# Patient Record
Sex: Male | Born: 1960 | Race: White | Hispanic: No | Marital: Married | Smoking: Current some day smoker
Health system: Southern US, Community
[De-identification: ages and names within clinical notes are randomized; demographics above are authoritative.]

## PROBLEM LIST (undated history)

## (undated) DIAGNOSIS — R0600 Dyspnea, unspecified: Secondary | ICD-10-CM

## (undated) DIAGNOSIS — G8929 Other chronic pain: Secondary | ICD-10-CM

## (undated) DIAGNOSIS — I10 Essential (primary) hypertension: Secondary | ICD-10-CM

## (undated) DIAGNOSIS — J449 Chronic obstructive pulmonary disease, unspecified: Secondary | ICD-10-CM

## (undated) DIAGNOSIS — I669 Occlusion and stenosis of unspecified cerebral artery: Secondary | ICD-10-CM

## (undated) DIAGNOSIS — F319 Bipolar disorder, unspecified: Secondary | ICD-10-CM

## (undated) DIAGNOSIS — M549 Dorsalgia, unspecified: Secondary | ICD-10-CM

## (undated) DIAGNOSIS — K7290 Hepatic failure, unspecified without coma: Secondary | ICD-10-CM

## (undated) DIAGNOSIS — F32A Depression, unspecified: Secondary | ICD-10-CM

## (undated) DIAGNOSIS — H5440 Blindness, one eye, unspecified eye: Secondary | ICD-10-CM

## (undated) DIAGNOSIS — R569 Unspecified convulsions: Secondary | ICD-10-CM

## (undated) DIAGNOSIS — K759 Inflammatory liver disease, unspecified: Secondary | ICD-10-CM

## (undated) DIAGNOSIS — I517 Cardiomegaly: Secondary | ICD-10-CM

## (undated) DIAGNOSIS — I679 Cerebrovascular disease, unspecified: Secondary | ICD-10-CM

## (undated) DIAGNOSIS — F0390 Unspecified dementia without behavioral disturbance: Secondary | ICD-10-CM

## (undated) DIAGNOSIS — F419 Anxiety disorder, unspecified: Secondary | ICD-10-CM

## (undated) DIAGNOSIS — K7682 Hepatic encephalopathy: Secondary | ICD-10-CM

## (undated) DIAGNOSIS — K219 Gastro-esophageal reflux disease without esophagitis: Secondary | ICD-10-CM

## (undated) DIAGNOSIS — I890 Lymphedema, not elsewhere classified: Secondary | ICD-10-CM

## (undated) DIAGNOSIS — F329 Major depressive disorder, single episode, unspecified: Secondary | ICD-10-CM

## (undated) DIAGNOSIS — M1990 Unspecified osteoarthritis, unspecified site: Secondary | ICD-10-CM

## (undated) DIAGNOSIS — M21961 Unspecified acquired deformity of right lower leg: Secondary | ICD-10-CM

## (undated) DIAGNOSIS — K5790 Diverticulosis of intestine, part unspecified, without perforation or abscess without bleeding: Secondary | ICD-10-CM

## (undated) DIAGNOSIS — E785 Hyperlipidemia, unspecified: Secondary | ICD-10-CM

## (undated) DIAGNOSIS — Z91199 Patient's noncompliance with other medical treatment and regimen due to unspecified reason: Secondary | ICD-10-CM

## (undated) DIAGNOSIS — H9190 Unspecified hearing loss, unspecified ear: Secondary | ICD-10-CM

## (undated) DIAGNOSIS — D696 Thrombocytopenia, unspecified: Secondary | ICD-10-CM

## (undated) DIAGNOSIS — Z9119 Patient's noncompliance with other medical treatment and regimen: Secondary | ICD-10-CM

## (undated) DIAGNOSIS — K7460 Unspecified cirrhosis of liver: Secondary | ICD-10-CM

## (undated) DIAGNOSIS — F79 Unspecified intellectual disabilities: Secondary | ICD-10-CM

## (undated) DIAGNOSIS — D649 Anemia, unspecified: Secondary | ICD-10-CM

## (undated) HISTORY — DX: Occlusion and stenosis of unspecified cerebral artery: I66.9

## (undated) HISTORY — DX: Cardiomegaly: I51.7

## (undated) HISTORY — DX: Thrombocytopenia, unspecified: D69.6

## (undated) HISTORY — DX: Hyperlipidemia, unspecified: E78.5

## (undated) HISTORY — DX: Unspecified hearing loss, unspecified ear: H91.90

## (undated) HISTORY — PX: TONSILLECTOMY: SUR1361

## (undated) HISTORY — PX: OTHER SURGICAL HISTORY: SHX169

## (undated) HISTORY — DX: Diverticulosis of intestine, part unspecified, without perforation or abscess without bleeding: K57.90

## (undated) HISTORY — PX: FOOT SURGERY: SHX648

## (undated) HISTORY — DX: Cerebrovascular disease, unspecified: I67.9

## (undated) HISTORY — DX: Anxiety disorder, unspecified: F41.9

## (undated) HISTORY — DX: Anemia, unspecified: D64.9

---

## 2000-12-08 ENCOUNTER — Other Ambulatory Visit: Admission: RE | Admit: 2000-12-08 | Discharge: 2000-12-08 | Payer: Self-pay | Admitting: Dermatology

## 2004-11-26 ENCOUNTER — Ambulatory Visit: Payer: Self-pay | Admitting: Cardiology

## 2004-12-02 ENCOUNTER — Ambulatory Visit: Payer: Self-pay

## 2006-04-04 ENCOUNTER — Emergency Department (HOSPITAL_COMMUNITY): Admission: EM | Admit: 2006-04-04 | Discharge: 2006-04-04 | Payer: Self-pay | Admitting: Emergency Medicine

## 2008-11-04 ENCOUNTER — Ambulatory Visit: Payer: Self-pay | Admitting: Internal Medicine

## 2008-11-13 LAB — CBC WITH DIFFERENTIAL/PLATELET
Basophils Absolute: 0 10*3/uL (ref 0.0–0.1)
EOS%: 3.3 % (ref 0.0–7.0)
Eosinophils Absolute: 0.2 10*3/uL (ref 0.0–0.5)
HCT: 41.5 % (ref 38.4–49.9)
HGB: 14.5 g/dL (ref 13.0–17.1)
MCH: 33.8 pg — ABNORMAL HIGH (ref 27.2–33.4)
MCV: 96.6 fL (ref 79.3–98.0)
MONO%: 7.9 % (ref 0.0–14.0)
NEUT#: 2.2 10*3/uL (ref 1.5–6.5)
NEUT%: 42.7 % (ref 39.0–75.0)

## 2008-11-13 LAB — COMPREHENSIVE METABOLIC PANEL
AST: 42 U/L — ABNORMAL HIGH (ref 0–37)
Albumin: 4.5 g/dL (ref 3.5–5.2)
Alkaline Phosphatase: 88 U/L (ref 39–117)
BUN: 8 mg/dL (ref 6–23)
Calcium: 9.7 mg/dL (ref 8.4–10.5)
Chloride: 101 mEq/L (ref 96–112)
Creatinine, Ser: 0.87 mg/dL (ref 0.40–1.50)
Glucose, Bld: 253 mg/dL — ABNORMAL HIGH (ref 70–99)

## 2010-04-10 ENCOUNTER — Emergency Department (HOSPITAL_COMMUNITY): Admission: EM | Admit: 2010-04-10 | Discharge: 2010-04-10 | Payer: Self-pay | Admitting: Emergency Medicine

## 2010-08-10 ENCOUNTER — Encounter: Payer: Self-pay | Admitting: Orthopedic Surgery

## 2010-08-10 ENCOUNTER — Inpatient Hospital Stay (HOSPITAL_COMMUNITY): Admission: EM | Admit: 2010-08-10 | Discharge: 2010-08-13 | Payer: Self-pay | Source: Home / Self Care

## 2010-08-11 ENCOUNTER — Encounter: Payer: Self-pay | Admitting: Orthopedic Surgery

## 2010-08-12 ENCOUNTER — Encounter (INDEPENDENT_AMBULATORY_CARE_PROVIDER_SITE_OTHER): Payer: Self-pay | Admitting: Internal Medicine

## 2010-09-23 ENCOUNTER — Telehealth: Payer: Self-pay | Admitting: Orthopedic Surgery

## 2010-09-24 ENCOUNTER — Ambulatory Visit: Admit: 2010-09-24 | Payer: Self-pay | Admitting: Orthopedic Surgery

## 2010-09-24 ENCOUNTER — Ambulatory Visit: Payer: Self-pay | Admitting: Orthopedic Surgery

## 2010-09-30 NOTE — Consult Note (Signed)
Summary: Hosp consult RT hand [facility] consult RT hand FX   Imported By: Ihor Austin 09/20/2010 13:33:57  _____________________________________________________________________  External Attachment:    Type:   Image     Comment:   External Document

## 2010-09-30 NOTE — Letter (Signed)
Summary: authorization notification [employer]  authorization notification [place] By: Ihor Austin 09/25/2010 13:55:04  _____________________________________________________________________  External Attachment:    Type:   Image     Comment:   External Document

## 2010-09-30 NOTE — Progress Notes (Signed)
Summary: patient request to cancel appointment for now  Phone Note Call from Patient   Summary of Call: When our office called patient earlier today to remind of tomorrow's appointment (09/24/10, 10:30am, family member requested cancelling appointment. *  * I called back to patient for further information and offered to re-schedule, as this is patient's initial hospital fol/up appointment. Initial call taken by: Ihor Austin,  September 23, 2010 6:04 PM  Follow-up for Phone Call        ok Follow-up by: Peter Minium,  September 24, 2010 8:28 AM  Additional Follow-up for Phone Call Additional follow up Details #1::        No response back as of yet; patient on reminder/recall to call for appointment. Additional Follow-up by: Ihor Austin,  September 25, 2010 8:52 AM

## 2010-10-05 ENCOUNTER — Emergency Department (HOSPITAL_COMMUNITY): Payer: MEDICARE

## 2010-10-05 ENCOUNTER — Inpatient Hospital Stay (HOSPITAL_COMMUNITY)
Admission: EM | Admit: 2010-10-05 | Discharge: 2010-10-08 | DRG: 442 | Disposition: A | Discharge status: Skilled Nursing Facility | Payer: MEDICARE | Attending: Internal Medicine | Admitting: Internal Medicine

## 2010-10-05 DIAGNOSIS — R269 Unspecified abnormalities of gait and mobility: Secondary | ICD-10-CM | POA: Diagnosis present

## 2010-10-05 DIAGNOSIS — E119 Type 2 diabetes mellitus without complications: Secondary | ICD-10-CM | POA: Diagnosis present

## 2010-10-05 DIAGNOSIS — I10 Essential (primary) hypertension: Secondary | ICD-10-CM | POA: Diagnosis present

## 2010-10-05 DIAGNOSIS — K7682 Hepatic encephalopathy: Principal | ICD-10-CM | POA: Diagnosis present

## 2010-10-05 DIAGNOSIS — R627 Adult failure to thrive: Secondary | ICD-10-CM | POA: Diagnosis present

## 2010-10-05 DIAGNOSIS — Z9181 History of falling: Secondary | ICD-10-CM

## 2010-10-05 DIAGNOSIS — F3289 Other specified depressive episodes: Secondary | ICD-10-CM | POA: Diagnosis present

## 2010-10-05 DIAGNOSIS — G8929 Other chronic pain: Secondary | ICD-10-CM | POA: Diagnosis present

## 2010-10-05 DIAGNOSIS — M549 Dorsalgia, unspecified: Secondary | ICD-10-CM | POA: Diagnosis present

## 2010-10-05 DIAGNOSIS — D684 Acquired coagulation factor deficiency: Secondary | ICD-10-CM | POA: Diagnosis present

## 2010-10-05 DIAGNOSIS — L259 Unspecified contact dermatitis, unspecified cause: Secondary | ICD-10-CM | POA: Diagnosis present

## 2010-10-05 DIAGNOSIS — K7290 Hepatic failure, unspecified without coma: Principal | ICD-10-CM | POA: Diagnosis present

## 2010-10-05 DIAGNOSIS — M1990 Unspecified osteoarthritis, unspecified site: Secondary | ICD-10-CM | POA: Diagnosis present

## 2010-10-05 DIAGNOSIS — K7689 Other specified diseases of liver: Secondary | ICD-10-CM | POA: Diagnosis present

## 2010-10-05 DIAGNOSIS — R609 Edema, unspecified: Secondary | ICD-10-CM | POA: Diagnosis present

## 2010-10-05 DIAGNOSIS — D6959 Other secondary thrombocytopenia: Secondary | ICD-10-CM | POA: Diagnosis present

## 2010-10-05 DIAGNOSIS — Z66 Do not resuscitate: Secondary | ICD-10-CM | POA: Diagnosis present

## 2010-10-05 DIAGNOSIS — F411 Generalized anxiety disorder: Secondary | ICD-10-CM | POA: Diagnosis present

## 2010-10-05 DIAGNOSIS — R894 Abnormal immunological findings in specimens from other organs, systems and tissues: Secondary | ICD-10-CM | POA: Diagnosis present

## 2010-10-05 DIAGNOSIS — F329 Major depressive disorder, single episode, unspecified: Secondary | ICD-10-CM | POA: Diagnosis present

## 2010-10-05 DIAGNOSIS — G40909 Epilepsy, unspecified, not intractable, without status epilepticus: Secondary | ICD-10-CM | POA: Diagnosis present

## 2010-10-05 LAB — DIFFERENTIAL
Basophils Absolute: 0.1 10*3/uL (ref 0.0–0.1)
Basophils Relative: 1 % (ref 0–1)
Eosinophils Absolute: 0.2 10*3/uL (ref 0.0–0.7)
Eosinophils Relative: 4 % (ref 0–5)
Lymphocytes Relative: 45 % (ref 12–46)
Lymphs Abs: 1.9 10*3/uL (ref 0.7–4.0)
Monocytes Absolute: 0.5 10*3/uL (ref 0.1–1.0)
Monocytes Relative: 12 % (ref 3–12)
Neutro Abs: 1.6 10*3/uL — ABNORMAL LOW (ref 1.7–7.7)
Neutrophils Relative %: 39 % — ABNORMAL LOW (ref 43–77)

## 2010-10-05 LAB — GLUCOSE, CAPILLARY
Glucose-Capillary: 98 mg/dL (ref 70–99)
Glucose-Capillary: 119 mg/dL — ABNORMAL HIGH (ref 70–99)

## 2010-10-05 LAB — CARDIAC PANEL(CRET KIN+CKTOT+MB+TROPI)
CK, MB: 1.1 ng/mL (ref 0.3–4.0)
Relative Index: INVALID (ref 0.0–2.5)
Total CK: 72 U/L (ref 7–232)
Troponin I: 0.01 ng/mL (ref 0.00–0.06)

## 2010-10-05 LAB — COMPREHENSIVE METABOLIC PANEL
ALT: 24 U/L (ref 0–53)
AST: 52 U/L — ABNORMAL HIGH (ref 0–37)
Albumin: 3.4 g/dL — ABNORMAL LOW (ref 3.5–5.2)
Alkaline Phosphatase: 101 U/L (ref 39–117)
BUN: 8 mg/dL (ref 6–23)
CO2: 25 mEq/L (ref 19–32)
Calcium: 9.2 mg/dL (ref 8.4–10.5)
Chloride: 105 mEq/L (ref 96–112)
Creatinine, Ser: 0.56 mg/dL (ref 0.4–1.5)
GFR calc Af Amer: >60 mL/min (ref >60)
GFR calc non Af Amer: >60 mL/min (ref >60)
Glucose, Bld: 94 mg/dL (ref 70–99)
Potassium: 4.1 mEq/L (ref 3.5–5.1)
Sodium: 136 mEq/L (ref 135–145)
Total Bilirubin: 2.6 mg/dL — ABNORMAL HIGH (ref 0.3–1.2)
Total Protein: 7.2 g/dL (ref 6.0–8.3)

## 2010-10-05 LAB — AMMONIA: Ammonia: 143 umol/L — ABNORMAL HIGH (ref 11–35)

## 2010-10-05 LAB — CBC
HCT: 35.7 % — ABNORMAL LOW (ref 39.0–52.0)
Hemoglobin: 12 g/dL — ABNORMAL LOW (ref 13.0–17.0)
MCH: 32 pg (ref 26.0–34.0)
MCHC: 33.6 g/dL (ref 30.0–36.0)
MCV: 95.2 fL (ref 78.0–100.0)
Platelets: 75 10*3/uL — ABNORMAL LOW (ref 150–400)
RBC: 3.75 MIL/uL — ABNORMAL LOW (ref 4.22–5.81)
RDW: 16.5 % — ABNORMAL HIGH (ref 11.5–15.5)
WBC: 4.2 10*3/uL (ref 4.0–10.5)

## 2010-10-06 DIAGNOSIS — K7290 Hepatic failure, unspecified without coma: Secondary | ICD-10-CM

## 2010-10-06 LAB — CBC
HCT: 33.9 % — ABNORMAL LOW (ref 39.0–52.0)
Hemoglobin: 11.5 g/dL — ABNORMAL LOW (ref 13.0–17.0)
MCH: 32.2 pg (ref 26.0–34.0)
MCHC: 33.9 g/dL (ref 30.0–36.0)
MCV: 95 fL (ref 78.0–100.0)
Platelets: 74 10*3/uL — ABNORMAL LOW (ref 150–400)
RBC: 3.57 MIL/uL — ABNORMAL LOW (ref 4.22–5.81)
RDW: 16.3 % — ABNORMAL HIGH (ref 11.5–15.5)
WBC: 4.2 10*3/uL (ref 4.0–10.5)

## 2010-10-06 LAB — COMPREHENSIVE METABOLIC PANEL
Albumin: 3.1 g/dL — ABNORMAL LOW (ref 3.5–5.2)
BUN: 5 mg/dL — ABNORMAL LOW (ref 6–23)
CO2: 25 mEq/L (ref 19–32)
Chloride: 106 mEq/L (ref 96–112)
Creatinine, Ser: 0.61 mg/dL (ref 0.4–1.5)
GFR calc non Af Amer: >60 mL/min (ref >60)
Total Bilirubin: 2.3 mg/dL — ABNORMAL HIGH (ref 0.3–1.2)

## 2010-10-06 LAB — DIFFERENTIAL
Basophils Absolute: 0.1 10*3/uL (ref 0.0–0.1)
Basophils Relative: 1 % (ref 0–1)
Eosinophils Absolute: 0.1 10*3/uL (ref 0.0–0.7)
Eosinophils Relative: 2 % (ref 0–5)
Lymphocytes Relative: 59 % — ABNORMAL HIGH (ref 12–46)
Lymphs Abs: 2.5 10*3/uL (ref 0.7–4.0)
Monocytes Absolute: 0.5 10*3/uL (ref 0.1–1.0)
Monocytes Relative: 11 % (ref 3–12)
Neutro Abs: 1.1 10*3/uL — ABNORMAL LOW (ref 1.7–7.7)
Neutrophils Relative %: 27 % — ABNORMAL LOW (ref 43–77)

## 2010-10-06 LAB — AMMONIA: Ammonia: 131 umol/L — ABNORMAL HIGH (ref 11–35)

## 2010-10-06 LAB — APTT: aPTT: 40 seconds — ABNORMAL HIGH (ref 24–37)

## 2010-10-06 LAB — CARDIAC PANEL(CRET KIN+CKTOT+MB+TROPI)
Relative Index: INVALID (ref 0.0–2.5)
Total CK: 61 U/L (ref 7–232)
Troponin I: 0.01 ng/mL (ref 0.00–0.06)

## 2010-10-06 LAB — GLUCOSE, CAPILLARY: Glucose-Capillary: 118 mg/dL — ABNORMAL HIGH (ref 70–99)

## 2010-10-06 LAB — PROTIME-INR
INR: 1.45 (ref 0.00–1.49)
Prothrombin Time: 17.8 seconds — ABNORMAL HIGH (ref 11.6–15.2)

## 2010-10-07 LAB — GLUCOSE, CAPILLARY
Glucose-Capillary: 114 mg/dL — ABNORMAL HIGH (ref 70–99)
Glucose-Capillary: 111 mg/dL — ABNORMAL HIGH (ref 70–99)
Glucose-Capillary: 118 mg/dL — ABNORMAL HIGH (ref 70–99)

## 2010-10-07 LAB — HEPATIC FUNCTION PANEL
ALT: 20 U/L (ref 0–53)
Albumin: 3.1 g/dL — ABNORMAL LOW (ref 3.5–5.2)
Alkaline Phosphatase: 87 U/L (ref 39–117)
Total Protein: 7 g/dL (ref 6.0–8.3)

## 2010-10-07 LAB — DIFFERENTIAL
Basophils Absolute: 0 10*3/uL (ref 0.0–0.1)
Basophils Relative: 1 % (ref 0–1)
Neutro Abs: 1.2 10*3/uL — ABNORMAL LOW (ref 1.7–7.7)
Neutrophils Relative %: 29 % — ABNORMAL LOW (ref 43–77)

## 2010-10-07 LAB — BASIC METABOLIC PANEL
Chloride: 103 mEq/L (ref 96–112)
Creatinine, Ser: 0.6 mg/dL (ref 0.4–1.5)
GFR calc Af Amer: >60 mL/min (ref >60)
GFR calc non Af Amer: >60 mL/min (ref >60)
Potassium: 3.5 mEq/L (ref 3.5–5.1)

## 2010-10-07 LAB — AMMONIA: Ammonia: 68 umol/L — ABNORMAL HIGH (ref 11–35)

## 2010-10-07 LAB — CBC
Hemoglobin: 12.2 g/dL — ABNORMAL LOW (ref 13.0–17.0)
Platelets: 65 10*3/uL — ABNORMAL LOW (ref 150–400)
RBC: 3.77 MIL/uL — ABNORMAL LOW (ref 4.22–5.81)

## 2010-10-08 LAB — BASIC METABOLIC PANEL
BUN: 3 mg/dL — ABNORMAL LOW (ref 6–23)
CO2: 26 mEq/L (ref 19–32)
Calcium: 9 mg/dL (ref 8.4–10.5)
Creatinine, Ser: 0.66 mg/dL (ref 0.4–1.5)
GFR calc non Af Amer: >60 mL/min (ref >60)
Glucose, Bld: 104 mg/dL — ABNORMAL HIGH (ref 70–99)
Sodium: 141 mEq/L (ref 135–145)

## 2010-10-08 LAB — GLUCOSE, CAPILLARY: Glucose-Capillary: 154 mg/dL — ABNORMAL HIGH (ref 70–99)

## 2010-10-08 NOTE — H&P (Signed)
[name] [name], MALECHA               ACCOUNT NO.:  [id number]  MEDICAL RECORD NO.:  [id number]           PATIENT TYPE:  I  LOCATION:  A303                          FACILITY:  [facility]  PHYSICIAN:  Elliot Cousin, M.D.    DATE OF BIRTH:  [birth year]  DATE OF ADMISSION:  10/05/2010 DATE OF DISCHARGE:  LH                             HISTORY & PHYSICAL   PRIMARY CARE PHYSICIAN:  Liborio Nixon B. Reuel Boom, NP at [facility].  PRIMARY NEUROLOGIST:  Kofi A. Gerilyn Pilgrim, MD  CHIEF COMPLAINT:  Increased confusion and sleepiness.  The patient has also had multiple falls.  HISTORY OF PRESENT ILLNESS:  The patient is a 50 year old man with a past medical history significant for  cirrhosis, gait disorder, type 2 diabetes mellitus, and morbid obesity.  He was recently hospitalized in December 2011 for frequent falls, confusion, and generalized weakness.  He underwent an extensive evaluation during the December hospitalization.  From the hospitalization, the patient was noted to have severe cerebrovascular disease, right vertebral artery occlusion, hepatic encephalopathy, possible complex partial seizures, and probable cirrhosis with an elevated antimitochondrial antibody.  At that time, the patient was discharged to home on August 14, 2010 with physical therapy and treatment with new medications; Keppra and lactulose.  The patient did have some improvement in his gait at home; however, it was short lived.  Up until a couple of days ago, he had been taking lactulose consistently.  He was evaluated by his neurologist last week.  Apparently, Dr. Gerilyn Pilgrim advised the patient's wife to increase the dose of the lactulose to three times daily because he had become progressively more confused, generally weak, and began to fall again. The patient refused to take the lactulose three times daily, but was willing to take it once or twice daily.  Over the past 2-3 days, the patient's confusion has  increased.  He is becoming more sleepy.  He is sleeping most of the day.  He has had multiple falls over the past week numbering 4 or 5.  He did bruise the right side of his face; however, there was no significant head trauma.  He had no complaints of joint pain.  There has been no recent fever, chills, chest pain, or shortness of breath.  He has not had a bowel movement in 4 or 5 days despite lactulose therapy.  He has had some swelling in his lower legs and feet. He has had an increase in urine output.  He has had no recent low blood sugars.  In the emergency department, the patient is hemodynamically stable and afebrile.  X-rays of his hips are negative for fractures.  The CT scan of his head reveals no acute findings.  His lab data are significant for an ammonia level of 143, total bilirubin of 2.6, SGOT of 52, SGPT of 24, and platelet count of 75.  He is being admitted for further evaluation and management.  PAST MEDICAL HISTORY: 1. Probable cirrhosis.  The ultrasound of the patient's abdomen in     December 2011 revealed increased echogenicity of the hepatic     parenchymal echotexture.  His  acute viral hepatitis panel was     negative, ferritin was 30, alpha-1 antitrypsin was virtually     normal, ANA was negative, and antimitochondrial antibody was     positive. 2. Hepatic encephalopathy. 3. Cholelithiasis per ultrasound of the abdomen, December 2011. 4. Multifactorial gait disorder. 5. 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 6. Fracture of the fourth and fifth metacarpals on the right hand in     December 2011, treated by orthopedic surgeon, Dr. Romeo Apple. 7. Severe cerebrovascular disease per MRI of the brain in December     2011. 8. 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  There was a suggestion of left vertebral artery     stenosis on the MRA; however, the left vertebral artery was patent     on the  carotid ultrasound study. 9. Chronic thrombocytopenia likely secondary to liver disease. 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 12.Hearing impairment. 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 14.Type 2 diabetes mellitus.  The patient's hemoglobin A1c was 8.2 in     December 2011. 15.Hypertension. 16.Gastroesophageal reflux disease. 17.Chronic back pain, for which, he takes Vicodin chronically. 18.Depression with anxiety, for which, he takes fluoxetine and Xanax     chronically. 19.Tobacco abuse. 20.Morbid obesity. 21.Hyperlipidemia.  HOME MEDICATIONS: 1. Xanax 0.5 mg one tablet three times daily. 2. Omeprazole 20 mg two times daily. 3. Metformin 1000 mg two tablets in the morning. 4. Norvasc 5 mg daily. 5. Prozac 20 mg daily. 6. Crestor 10 mg nightly. 7. Keppra 500 mg one tablet twice daily. 8. Lopressor 50 mg one tablet in the morning and two tablets in the     evening. 9. Aspirin 81 mg daily. 10.Lactulose 10 g/15 mL, 30 mL twice daily. 11.Vicodin 5/500 mg one tablet three times daily for arthritis. 12.Lantus 50 units daily.  ALLERGIES:  The patient has an allergy to PENICILLIN.  SOCIAL HISTORY:  The patient is married.  [place].  He has no children.  He receives disability benefits.  He had been smoking one pack of cigarettes per day for many years, but he has recently cut down to less than a half a pack per day. He has no history of alcohol or illicit drug use.  FAMILY HISTORY:  His mother died of a heart attack at 84 years of age. Both his sister and his brother died of heart attacks.  His father died of a ruptured aneurysm in the brain.  He was 50 years of age.  REVIEW OF SYSTEMS:  As above in the history of present illness. Otherwise, review of systems is negative.  PHYSICAL EXAMINATION:  VITAL SIGNS:  Temperature 98.8, blood pressure 138/68, pulse 72, respiratory rate 16, oxygen saturation 96% on  room air. GENERAL:  The patient is a semi-alert, obese 50 year old Caucasian man who appears to be older than his stated age. HEENT:  Head is normocephalic and nontraumatic.  He does have a healing frontal scalp abrasion.  His right eye is obliterated.  The pupil of his left eye is round and reactive to light.  The left sclera and conjunctiva are unremarkable.  Extraocular movements are intact. Tympanic membranes reveal scarring bilaterally.  No active erythema or drainage.  Oropharynx reveals no teeth.  Mucous membranes are mildly dry.  No posterior exudates or erythema. NECK:  Supple.  No adenopathy, no thyromegaly, no bruit, no JVD. LUNGS:  Decreased breath sounds in  the bases, otherwise clear. HEART:  S1-S2 with a soft systolic murmur. ABDOMEN:  Obese, positive bowel sounds, soft, nontender, nondistended. No hepatosplenomegaly.  No masses palpated. GU:  Deferred. RECTAL:  Deferred. SKIN:  There is diffuse acne-type erythema with macules and papules on his face which are not new according to his wife.  There are few excoriated lesions on the pretibial surface of his right leg.  No active bleeding. EXTREMITIES:  Pedal pulses are barely palpable bilaterally.  There is 1+ bilateral distal lower extremity edema and pedal edema.  There are no acute red hot joints.  He is able to move his extremities freely in the supine position. NEUROLOGIC:  The patient is semi-alert.  He does answer to his name, although he is hard of hearing.  He does squeeze the examiner's hands with both of his hands with fair strength.  He is unable to perform the exam to evaluate for asterixis.  He is chronically blind in the right eye.  He is semi-alert.  He is oriented to himself and his wife.  Cranial nerves appear to be intact with exception of the decrease in his hearing and decrease in the site of both eyes grossly. Strength; he is able to raise both of his arms against gravity approximately 30 degrees.   He is able to raise his left leg against gravity approximately 45 degrees.  He is able to raise his right leg against gravity approximately 30 degrees.  Sensation is grossly intact with sharp touch.  The patient is able to sip water without choking or gagging.  ADMISSION LABORATORY RESULTS:  EKG reveals normal sinus rhythm with a heart rate of 73 beats per minute.  X-rays of the hips and pelvis are within normal limits.  CT scan of his head reveals no acute intracranial findings.  Portable chest x-ray reveals cardiomegaly without any acute findings.  Ammonia 143.  Sodium 136, potassium 4.1, chloride 105, CO2 25, glucose 94, BUN 8, creatinine 0.56, total bilirubin 2.6, alkaline phosphatase 101, SGOT 52, SGPT 24, total protein 7.2, albumin 3.4, calcium 9.2.  WBC 4.2, hemoglobin 12.0, platelet count 75.  ASSESSMENT: 1. Recurrent hepatic encephalopathy.  The patient's blood ammonia     level is 143.  In December, prior to hospital discharge, it was     105. 2. Probable cirrhosis with the sequelae of cirrhosis such as     thrombocytopenia, hepatic encephalopathy, and coagulopathy.  His     antimitochondrial antibody was elevated as noted during the     previous hospitalization; however, the significance of his     elevation is unclear.  Certainly, given his elevated total     bilirubin, primary biliary cirrhosis is a consideration. 3. Frequent falls and progressive failure to thrive.  The patient's     gait disorder is multifactorial including obesity, degenerative     joint disease, progressive liver disease, and cerebrovascular and     peripheral vascular disease.  He may also have autonomic     dysfunction and peripheral neuropathy coupled with poor sight,     which could be contributing to his frequent falling. 4. Peripheral edema, possibly secondary to progressive liver disease. 5. Type 2 diabetes mellitus.  The patient's venous glucose is within     normal limits currently.  He is  treated chronically with metformin. 6. Hypertension.  His blood pressure is only mildly elevated.  He is     treated chronically with metoprolol and Norvasc. 7. Seizure disorder.  This was  diagnosed in December 2011 with an EEG     that revealed epileptiform activity.  He was started on Keppra at     that time.  He is being followed by neurologist, Dr. Gerilyn Pilgrim.  PLAN: 1. The patient had an outpatient appointment scheduled to see the     gastroenterologist Dr. Karilyn Cota in March.  We will ask for a     consultation from Dr. Karilyn Cota or colleague during this     hospitalization. 2. We will increase the dose of lactulose to 45 mL four times daily     for 1 day and then three times daily thereafter or as tolerated.     We will start gentle diuretic therapy with Lasix at 20 mg daily for     peripheral edema.  This will be adjusted accordingly. 3. We will order a urinalysis and culture to rule out infection. 4. We will check his PTT and PT.  We will order cardiac enzymes to     rule out myocardial ischemia. 5. We will order neuro checks every 4 hours for 24 hours. 6. We will consult the physical therapist for evaluation.  It is     likely that the patient will need skilled nursing facility     placement.  I have discussed this with Mrs. Matthews who is in full     agreement. 7. Per my conversation with Mrs. Mcmenamy, the patient is now a do not     resuscitate status. 8. Supportive treatment.     Elliot Cousin, M.D.     DF/MEDQ  D:  10/05/2010  T:  10/05/2010  Job:  [id number]  cc:   Philis Pique. Reuel Boom, NP  Kofi A. Gerilyn Pilgrim, M.D. Fax: [phone]  Electronically Signed by Elliot Cousin M.D. on 10/08/2010 02:18:15 PM

## 2010-10-08 NOTE — Discharge Summary (Signed)
[name] [name], [name] [name]               ACCOUNT NO.:  [id number]  MEDICAL RECORD NO.:  [id number]           PATIENT TYPE:  I  LOCATION:  A303                          FACILITY:  [facility]  PHYSICIAN:  Elliot Cousin, M.D.    DATE OF BIRTH:  July 31, 1961  DATE OF ADMISSION:  10/05/2010 DATE OF DISCHARGE:  02/16/2012LH                         DISCHARGE SUMMARY-REFERRING   DISCHARGE DIAGNOSES: 1. Recurrent hepatic encephalopathy.  The patient's blood ammonia     level was 143 on admission and 90 at the time of hospital     discharge. 2. 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 3. Elevated antimitochondrial antibody of unknown significance;     diagnosis not clearly primary biliary cirrhosis. 4. Chronic gait disorder. 5. Progressive deconditioning/FTT. 6. Type 2 diabetes mellitus. 7. Hypertension. 8. Seizure disorder. 9. Peripheral edema. 10.Facial dermatitis which appears to be eczema. 11.Chronic anxiety. 12.Chronic depression. 13.Chronic low back pain and degenerative joint disease. 14.Coagulopathy, thrombocytopenia, hyperbilirubinemia all secondary to     cirrhosis.  DISCHARGE MEDICATIONS: 1. Rifaximin 550 mg b.i.d. (new medication). 2. Ativan 0.25 mg b.i.d.  (new medication). 3. Norvasc 5 mg daily. 4. Aspirin 81 mg daily. 5. Colace 100 mg b.i.d. 6. Prozac 20 mg daily. 7. Lasix 20 mg daily. 8. Potassium chloride 20 mEq daily. 9. Lactulose 45 mL 3 times daily. 10.Keppra 500 mg b.i.d. 11.Omeprazole 20 mg daily. 12.MiraLax laxative 17 grams in 8 ounces of water daily (hold for     diarrhea). 13.Crestor 10 mg at bedtime. 14.Metoprolol tartrate 50 mg 1 tablet in morning and 2 tablets at     bedtime. 15.Lantus insulin 25 units subcu twice daily. 16.Metformin 500 mg twice daily. 17.Vicodin 5/325 mg 1 tablet every 6-8 hours as needed for pain. 18.Hydrocortisone topical cream 0.5% apply to the face once daily. 19.Stop Xanax.  DISCHARGE DISPOSITION:  The patient  was discharged to [facility] in improved and stable condition on October 08, 2010.  He has an appointment to follow up with gastroenterologist Dr. Lionel December in March and with his ophthalmologist in March as well.  CONSULTATIONS:  Lionel December, MD  CODE STATUS:  No code blue/DNR.  ACTIVITY:  As tolerated with physical therapy.  DIET:  Medium carbohydrate modified, 80 gm protein restriction.  The patient's capillary blood glucose should be monitored at least before breakfast and at bedtime.  PROCEDURE PERFORMED: 1. X-ray of the hips and pelvis.  No evidence of fracture dislocation     or other significant abnormality. 2. CT scan of the head without contrast.  The results revealed normal     appearance of the brain.  No acute or reversible intracranial     process. 3. Chest x-ray.  The results revealed cardiomegaly without acute     findings.  HISTORY OF PRESENTING ILLNESS:  The patient is a 50 year old man with a past medical history significant for cirrhosis, gait disorder, type 2 diabetes mellitus, and seizure disorder.  He presented to the emergency department on October 05, 2010 with a report of increased confusion and sleepiness as reported by his wife.  The patient had also had multiple falls at home  with no obvious acute head injuries.  When he was initially evaluated, he was noted to be hemodynamically stable and afebrile.  The CT scan of his head revealed no acute findings.  His lab data were significant for an ammonia level of 143, total bilirubin of 2.6, SGOT of 52, SGPT of 24, and platelet count of 75,000.  He was admitted for further evaluation and management.  HOSPITAL COURSE: 1. RECURRENT HEPATIC ENCEPHALOPATHY, CIRRHOSIS, CHRONIC     THROMBOCYTOPENIA, CHRONIC COAGULOPATHY, and PERIPHERAL EDEMA.  The     patient was started on an increased dose of lactulose at 45 mL 4     times daily.  The dose was subsequently decreased slightly  to     t.i.d. dosing.  Gentle IV fluids were started, although the patient     did have some pedal edema.  In addition to the IV fluids, he was     started on gentle Lasix.  The patient did undergo an extensive     workup during the previous hospitalization with regards to his     cirrhosis.  The only significant finding was that the patient's     antimitochondrial antibody was slightly elevated.     Gastroenterologist Dr. Lionel December was consulted.  He evaluated     the patient and agreed with the increase in the dose of lactulose.     He did not believe the patient had primary biliary cirrhosis given     that his alkaline phosphatase was relatively normal.  The elevation     in the mitochondrial antibody was nonspecific.  He recommended     starting Xifaxan at 500 mg twice daily and reducing his protein     intake to 80 grams daily.  Given the patient's overall clinical     picture and debilitation, he was not deemed to be a candidate for     liver biopsy.  Following lactulose therapy, the patient had     multiple loose bowel movements.  His ammonia level decreased     progressively to a nadir of 68.  It did increase some to 90 before     hospital discharge.  His encephalopathy completely resolved.  He     did have residual asterixis, however, on my exam this morning,     there was significantly less evidence of asterixis.  He chronically     has hyperbilirubinemia, hepatic transaminitis, thrombocytopenia,     and coagulopathy.  His total bilirubin was 2.4, platelet count was     65,000,  PT was 40, and PT was 17.8 prior to hospital discharge. 2. SEIZURE DISORDER.  The patient was maintained on Keppra.  There was     no evidence of seizure activity during the hospitalization. 3. TYPE 2 DIABETES MELLITUS.  The patient's capillary blood glucose     was well controlled on sliding scale NovoLog.  Metformin was     temporarily withheld due to his liver disease.  It was restarted     upon  discharge at 500 mg b.i.d. which is half of his usual dose.     The dose was decreased due to his liver disease.  He will be     maintained on Lantus 25 units twice daily.  His capillary blood     glucose should be assessed at least twice daily. 4. HYPERTENSION.  Metoprolol was initially withheld due to low normal     blood pressures during the first 24 hours of the hospitalization.  Eventually, metoprolol was restarted when his blood pressure began     to increase and when he became slightly tachycardic.  Norvasc was     maintained at 5 mg daily.  His blood pressure was 136/78 and his     pulse rate was 111 prior to discharge.  He was given 50 mg dose of     metoprolol prior to discharge. 5. FREQUENT FALLS, GAIT DISORDER, and PROGRESSIVE FAILURE TO THRIVE.     The patient was diagnosed with a gait disorder which was deemed to     be multifactorial in etiology.  He has had progressive failure to     thrive at home.  He was evaluated by physical therapy.  Physical     therapist recommended ongoing strengthening and rehabilitation in     a skilled environment.  His wife was in agreement.  He will be     discharged to [facility] today.  DISCHARGE LABORATORY RESULTS:  Sodium 141, potassium 3.7, chloride 105, CO2 26, glucose 104, BUN 3, creatinine 0.66, calcium 9.0, ammonia 90, total bilirubin 2.4, direct bilirubin 0.6, indirect bilirubin 1.8, alkaline phosphatase 87, SGOT 45, SGPT 20, total protein 7.0, albumin 3.1, WBC 4.1, hemoglobin 12.2, platelet count 65,000, MCV 94.7.     Elliot Cousin, M.D.     DF/MEDQ  D:  10/08/2010  T:  10/08/2010  Job:  [id number]  cc:   Darleen Crocker A. Gerilyn Pilgrim, M.D. Fax: [phone]  Lionel December, M.D. Fax: [phone]  Christel Mormon, NP  Electronically Signed by Elliot Cousin M.D. on 10/08/2010 02:28:03 PM

## 2010-10-25 NOTE — Consult Note (Signed)
[name], [name]               ACCOUNT NO.:  [id number]  MEDICAL RECORD NO.:  [id number]           PATIENT TYPE:  I  LOCATION:  A303                          FACILITY:  [facility]  PHYSICIAN:  Hildred Laser, M.D.    DATE OF BIRTH:  [birth year]  DATE OF CONSULTATION: DATE OF DISCHARGE:                                CONSULTATION   REASON FOR CONSULT:  Hepatic encephalopathy.  HISTORY OF PRESENT ILLNESS:  [name] [name] is a 50 year old male admitted to the emergency department yesterday with increased confusion and sleepiness.  It was noted on admission, his ammonia level was elevated over 140.  Today, his ammonia is 131.  He was admitted in December 2011, for the same complaint.  His wife states that recently he has been stumbling and falling at home and has been confused and he was presented to the emergency room.  She states he is slow mentally.  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  There is no prior history of hepatic encephalopathy.  His wife states that he has never drank EtOH.  His appetite is good.  He has had weight loss of 15 pounds since the first of December.  He usually has one bowel movement a day.  There has been no rectal bleeding or black stools.  It was noted in December that he had a positive mitochondrial antibody.  He also underwent an abdominal ultrasound on August 10, 2010, which revealed slightly dilated common bile duct measuring 7.2 mm.  No choledocholithiasis was evident.  In the gallbladder, there is cholelithiasis.  There are several gallstones within the gallbladder.  The largest measured 4 mm in the greatest diameter.  There is gallbladder wall thickening.  There is no evidence of pericholecystic fluid.  There was no dilatation of branching intrahepatic duct is seen.  Increased echogenicity of hepatic parenchyma.  This is most commonly associated with hepatic steatosis.  PAST MEDICAL HISTORY:  Diabetes type 2 since 1990.  He has an enlarged heart.   He has hypertension, seizures, depression.  He is allergic to PENICILLIN which causes itching.  SURGERIES:  He had several ears surgeries to repair his hearing.  SOCIAL HISTORY:  He smokes two packs a day for at least 7 years.  He does not drink or do drugs.  He is married.  He does not have children. His father died of brain aneurysm and his mother died of an MI at age 23, one brother is deceased from an MI.  He had one brother with an enlarged liver, but note he drank excessively.  He has one sister deceased at age 67.  HOME MEDICATIONS: 1. Xanax 0.5 t.i.d. 2. Omeprazole 20 mg twice a day. 3. Metformin 1000 mg two in the morning. 4. Norvasc 5 mg a day. 5. Prozac 20 mg a day. 6. Crestor 10 mg a day. 7. Lantus 50 units a day. 8. Keppra 500 mg b.i.d. 9. Lopressor 50 mg one in the morning and two at night. 10.Aspirin 81 mg a day. 11.Lactulose 30 mL b.i.d. 12.Vicodin as needed.  OBJECTIVE:  VITAL SIGNS:  His temp is  97.9, his pulse is 83, his respirations 20, blood pressure is 139/65, his O2 sat is 94%.  His weight is 105.3 kg.  His height is 67 inches. HEENT:  He does have a large cataract on the right eye.  His conjunctivae is pink.  He is edentulous.  His oral mucosa is moist. There are no lesions.  He does have abrasion to his nose.  His thyroid is normal.  There is no cervical lymphadenopathy. HEART:  Regular rate and rhythm. ABDOMEN:  Soft.  Bowel sounds are positive, I am unable to palpate his liver. EXTREMITIES:  Have brownish discolored abrasion to his lower extremities.  I do not see any edema.  Labs on August 11, 2010, hepatitis B surface antigen was negative, hepatitis B core antibody was negative, hepatitis A antibody was negative, hepatitis C antibody was negative.  The mitochondrial antibody was positive.  The ceruloplasmin plasma is 29, alpha-1 antitrypsin 207, ferritin 30, folate 599, vitamin B12 was 472 on October 05, 2010. Sodium 141, potassium 3.7,  chloride 106, glucose 87, BUN 5, creatinine 0.61, his total bilirubin is 2.3, which is decreased from yesterday from 2.6, ALP is 86, AST 43, ALT 19, total protein 6.9, albumin 3.1, calcium 9, ammonia today is 131, PTT is 40.  PT is 17.8, INR 145, hemoglobin is 11.5, hematocrit 33.9, platelets are 74, creatinine kinase 72.  Troponin is 0.01.  ASSESSMENT:  [name] is a 50 year old male admitted with hepatic encephalopathy. he does not have obvious cause to induce HE. suspect he NAFLD. elevated AMA possibly nonspecific marker.  RECOMMENDATIONS:  I discussed this case with Dr. Laural Golden, the Xanax should be switched to Ativan, please consider.  We will start Xifaxan 550 mg p.o. twice a day.  Protein restriction is 80 g daily and continue the lactulose at present dose.  Thank you for allowing [place] to participate in his care and we will continue to monitor.    ______________________________ Deberah Castle, NP   ______________________________ Hildred Laser, M.D.    TS/MEDQ  D:  10/06/2010  T:  10/07/2010  Job:  [id number]  Electronically Signed by Deberah Castle PA on 10/12/2010 09:23:48 AM Electronically Signed by Hildred Laser M.D. on 10/25/2010 07:53:15 PM

## 2010-11-02 LAB — MITOCHONDRIAL ANTIBODIES: Mitochondrial M2 Ab, IgG: 1.16 — ABNORMAL HIGH (ref <0.91)

## 2010-11-02 LAB — URINE MICROSCOPIC-ADD ON

## 2010-11-02 LAB — PROTIME-INR
INR: 1.39 (ref 0.00–1.49)
INR: 1.59 — ABNORMAL HIGH (ref 0.00–1.49)
Prothrombin Time: 18.4 seconds — ABNORMAL HIGH (ref 11.6–15.2)

## 2010-11-02 LAB — CBC
HCT: 35.3 % — ABNORMAL LOW (ref 39.0–52.0)
HCT: 35.7 % — ABNORMAL LOW (ref 39.0–52.0)
HCT: 38.9 % — ABNORMAL LOW (ref 39.0–52.0)
Hemoglobin: 12.1 g/dL — ABNORMAL LOW (ref 13.0–17.0)
Hemoglobin: 13.4 g/dL (ref 13.0–17.0)
MCH: 31.7 pg (ref 26.0–34.0)
MCH: 31.8 pg (ref 26.0–34.0)
MCHC: 34.4 g/dL (ref 30.0–36.0)
MCHC: 34.7 g/dL (ref 30.0–36.0)
MCV: 91.3 fL (ref 78.0–100.0)
RBC: 3.85 MIL/uL — ABNORMAL LOW (ref 4.22–5.81)
RBC: 4.22 MIL/uL (ref 4.22–5.81)
RDW: 16.3 % — ABNORMAL HIGH (ref 11.5–15.5)
WBC: 4.1 10*3/uL (ref 4.0–10.5)
WBC: 4.7 10*3/uL (ref 4.0–10.5)

## 2010-11-02 LAB — COMPREHENSIVE METABOLIC PANEL
ALT: 19 U/L (ref 0–53)
Alkaline Phosphatase: 102 U/L (ref 39–117)
BUN: 5 mg/dL — ABNORMAL LOW (ref 6–23)
CO2: 25 mEq/L (ref 19–32)
Chloride: 102 mEq/L (ref 96–112)
Glucose, Bld: 91 mg/dL (ref 70–99)
Potassium: 4.2 mEq/L (ref 3.5–5.1)
Sodium: 140 mEq/L (ref 135–145)
Total Bilirubin: 1.8 mg/dL — ABNORMAL HIGH (ref 0.3–1.2)
Total Protein: 7.4 g/dL (ref 6.0–8.3)

## 2010-11-02 LAB — HEPATIC FUNCTION PANEL
ALT: 16 U/L (ref 0–53)
ALT: 17 U/L (ref 0–53)
AST: 39 U/L — ABNORMAL HIGH (ref 0–37)
Bilirubin, Direct: 0.7 mg/dL — ABNORMAL HIGH (ref 0.0–0.3)
Bilirubin, Direct: 0.7 mg/dL — ABNORMAL HIGH (ref 0.0–0.3)
Indirect Bilirubin: 1.8 mg/dL — ABNORMAL HIGH (ref 0.3–0.9)
Total Bilirubin: 2.1 mg/dL — ABNORMAL HIGH (ref 0.3–1.2)

## 2010-11-02 LAB — BASIC METABOLIC PANEL
BUN: 2 mg/dL — ABNORMAL LOW (ref 6–23)
CO2: 27 mEq/L (ref 19–32)
Chloride: 104 mEq/L (ref 96–112)
Creatinine, Ser: 0.63 mg/dL (ref 0.4–1.5)
GFR calc non Af Amer: >60 mL/min (ref >60)
Glucose, Bld: 100 mg/dL — ABNORMAL HIGH (ref 70–99)
Potassium: 3.8 mEq/L (ref 3.5–5.1)
Potassium: 3.9 mEq/L (ref 3.5–5.1)
Sodium: 142 mEq/L (ref 135–145)

## 2010-11-02 LAB — URINALYSIS, ROUTINE W REFLEX MICROSCOPIC
Bilirubin Urine: NEGATIVE
Glucose, UA: NEGATIVE mg/dL
Specific Gravity, Urine: 1.025 (ref 1.005–1.030)
pH: 6 (ref 5.0–8.0)

## 2010-11-02 LAB — GLUCOSE, CAPILLARY
Glucose-Capillary: 108 mg/dL — ABNORMAL HIGH (ref 70–99)
Glucose-Capillary: 112 mg/dL — ABNORMAL HIGH (ref 70–99)
Glucose-Capillary: 113 mg/dL — ABNORMAL HIGH (ref 70–99)
Glucose-Capillary: 121 mg/dL — ABNORMAL HIGH (ref 70–99)
Glucose-Capillary: 122 mg/dL — ABNORMAL HIGH (ref 70–99)
Glucose-Capillary: 127 mg/dL — ABNORMAL HIGH (ref 70–99)
Glucose-Capillary: 130 mg/dL — ABNORMAL HIGH (ref 70–99)
Glucose-Capillary: 98 mg/dL (ref 70–99)

## 2010-11-02 LAB — DIFFERENTIAL
Basophils Absolute: 0 10*3/uL (ref 0.0–0.1)
Basophils Absolute: 0 10*3/uL (ref 0.0–0.1)
Basophils Absolute: 0 10*3/uL (ref 0.0–0.1)
Basophils Relative: 1 % (ref 0–1)
Eosinophils Absolute: 0.2 10*3/uL (ref 0.0–0.7)
Eosinophils Absolute: 0.2 10*3/uL (ref 0.0–0.7)
Eosinophils Relative: 4 % (ref 0–5)
Lymphocytes Relative: 55 % — ABNORMAL HIGH (ref 12–46)
Lymphocytes Relative: 61 % — ABNORMAL HIGH (ref 12–46)
Monocytes Absolute: 0.4 10*3/uL (ref 0.1–1.0)
Monocytes Absolute: 0.5 10*3/uL (ref 0.1–1.0)
Monocytes Relative: 11 % (ref 3–12)
Monocytes Relative: 9 % (ref 3–12)
Neutro Abs: 1.1 10*3/uL — ABNORMAL LOW (ref 1.7–7.7)
Neutro Abs: 2.4 10*3/uL (ref 1.7–7.7)
Neutrophils Relative %: 48 % (ref 43–77)

## 2010-11-02 LAB — ETHANOL: Alcohol, Ethyl (B): <5 mg/dL (ref 0–10)

## 2010-11-02 LAB — HEPATITIS PANEL, ACUTE
HCV Ab: NEGATIVE
Hepatitis B Surface Ag: NEGATIVE

## 2010-11-02 LAB — RAPID URINE DRUG SCREEN, HOSP PERFORMED
Benzodiazepines: POSITIVE — AB
Cocaine: NOT DETECTED
Tetrahydrocannabinol: NOT DETECTED

## 2010-11-02 LAB — T4, FREE: Free T4: 1 ng/dL (ref 0.80–1.80)

## 2010-11-02 LAB — HEMOGLOBIN A1C: Hgb A1c MFr Bld: 8.2 % — ABNORMAL HIGH (ref <5.7)

## 2010-11-02 LAB — URINE CULTURE: Culture: NO GROWTH

## 2010-11-02 LAB — ANTI-SMOOTH MUSCLE ANTIBODY, IGG: F-Actin IgG: <20

## 2010-11-02 LAB — LIPID PANEL
Cholesterol: 131 mg/dL (ref 0–200)
LDL Cholesterol: 89 mg/dL (ref 0–99)
Triglycerides: 78 mg/dL (ref <150)

## 2010-11-02 LAB — POCT CARDIAC MARKERS: CKMB, poc: <1 ng/mL — ABNORMAL LOW (ref 1.0–8.0)

## 2010-11-02 LAB — FERRITIN: Ferritin: 30 ng/mL (ref 22–322)

## 2010-11-02 LAB — AMMONIA: Ammonia: 132 umol/L — ABNORMAL HIGH (ref 11–35)

## 2010-11-02 LAB — APTT
aPTT: 41 seconds — ABNORMAL HIGH (ref 24–37)
aPTT: 43 seconds — ABNORMAL HIGH (ref 24–37)

## 2010-11-02 LAB — CARDIAC PANEL(CRET KIN+CKTOT+MB+TROPI)
CK, MB: 1.2 ng/mL (ref 0.3–4.0)
Relative Index: INVALID (ref 0.0–2.5)
Troponin I: 0.02 ng/mL (ref 0.00–0.06)

## 2010-11-02 LAB — ALPHA-1-ANTITRYPSIN: A-1 Antitrypsin, Ser: 207 mg/dL — ABNORMAL HIGH (ref 83–200)

## 2010-11-06 LAB — CBC
HCT: 37.3 % — ABNORMAL LOW (ref 39.0–52.0)
Hemoglobin: 12.8 g/dL — ABNORMAL LOW (ref 13.0–17.0)
MCH: 34.3 pg — ABNORMAL HIGH (ref 26.0–34.0)
MCV: 100.1 fL — ABNORMAL HIGH (ref 78.0–100.0)
RBC: 3.73 MIL/uL — ABNORMAL LOW (ref 4.22–5.81)
WBC: 4.5 10*3/uL (ref 4.0–10.5)

## 2010-11-06 LAB — URINALYSIS, ROUTINE W REFLEX MICROSCOPIC
Glucose, UA: 500 mg/dL — AB
Ketones, ur: NEGATIVE mg/dL
Protein, ur: NEGATIVE mg/dL
Urobilinogen, UA: 1 mg/dL (ref 0.0–1.0)

## 2010-11-06 LAB — POCT CARDIAC MARKERS
CKMB, poc: <1 ng/mL — ABNORMAL LOW (ref 1.0–8.0)
Myoglobin, poc: 68 ng/mL (ref 12–200)
Troponin i, poc: <0.05 ng/mL (ref 0.00–0.09)

## 2010-11-06 LAB — BASIC METABOLIC PANEL
BUN: 4 mg/dL — ABNORMAL LOW (ref 6–23)
Chloride: 103 mEq/L (ref 96–112)
Potassium: 3.9 mEq/L (ref 3.5–5.1)
Sodium: 137 mEq/L (ref 135–145)

## 2010-11-06 LAB — DIFFERENTIAL
Eosinophils Absolute: 0.2 10*3/uL (ref 0.0–0.7)
Eosinophils Relative: 4 % (ref 0–5)
Lymphocytes Relative: 39 % (ref 12–46)
Lymphs Abs: 1.8 10*3/uL (ref 0.7–4.0)
Monocytes Absolute: 0.7 10*3/uL (ref 0.1–1.0)
Monocytes Relative: 16 % — ABNORMAL HIGH (ref 3–12)

## 2010-11-06 LAB — PROTIME-INR: Prothrombin Time: 17.4 seconds — ABNORMAL HIGH (ref 11.6–15.2)

## 2010-11-06 LAB — TYPE AND SCREEN: Antibody Screen: NEGATIVE

## 2010-12-28 ENCOUNTER — Ambulatory Visit (INDEPENDENT_AMBULATORY_CARE_PROVIDER_SITE_OTHER): Payer: MEDICARE | Admitting: Internal Medicine

## 2011-01-28 ENCOUNTER — Emergency Department (HOSPITAL_COMMUNITY): Payer: Medicare Other

## 2011-01-28 ENCOUNTER — Inpatient Hospital Stay (HOSPITAL_COMMUNITY)
Admission: EM | Admit: 2011-01-28 | Discharge: 2011-02-03 | DRG: 442 | Disposition: A | Discharge status: Home Health | Payer: Medicare Other | Attending: Internal Medicine | Admitting: Internal Medicine

## 2011-01-28 DIAGNOSIS — K8020 Calculus of gallbladder without cholecystitis without obstruction: Secondary | ICD-10-CM | POA: Diagnosis present

## 2011-01-28 DIAGNOSIS — F341 Dysthymic disorder: Secondary | ICD-10-CM | POA: Diagnosis present

## 2011-01-28 DIAGNOSIS — F70 Mild intellectual disabilities: Secondary | ICD-10-CM | POA: Diagnosis present

## 2011-01-28 DIAGNOSIS — K219 Gastro-esophageal reflux disease without esophagitis: Secondary | ICD-10-CM | POA: Diagnosis present

## 2011-01-28 DIAGNOSIS — D684 Acquired coagulation factor deficiency: Secondary | ICD-10-CM | POA: Diagnosis present

## 2011-01-28 DIAGNOSIS — K319 Disease of stomach and duodenum, unspecified: Secondary | ICD-10-CM | POA: Diagnosis present

## 2011-01-28 DIAGNOSIS — K7682 Hepatic encephalopathy: Principal | ICD-10-CM | POA: Diagnosis present

## 2011-01-28 DIAGNOSIS — K7460 Unspecified cirrhosis of liver: Secondary | ICD-10-CM | POA: Diagnosis present

## 2011-01-28 DIAGNOSIS — K920 Hematemesis: Secondary | ICD-10-CM | POA: Diagnosis present

## 2011-01-28 DIAGNOSIS — E785 Hyperlipidemia, unspecified: Secondary | ICD-10-CM | POA: Diagnosis present

## 2011-01-28 DIAGNOSIS — G40909 Epilepsy, unspecified, not intractable, without status epilepticus: Secondary | ICD-10-CM | POA: Diagnosis present

## 2011-01-28 DIAGNOSIS — I10 Essential (primary) hypertension: Secondary | ICD-10-CM | POA: Diagnosis present

## 2011-01-28 DIAGNOSIS — Z794 Long term (current) use of insulin: Secondary | ICD-10-CM

## 2011-01-28 DIAGNOSIS — Z66 Do not resuscitate: Secondary | ICD-10-CM | POA: Diagnosis present

## 2011-01-28 DIAGNOSIS — K7290 Hepatic failure, unspecified without coma: Principal | ICD-10-CM | POA: Diagnosis present

## 2011-01-28 DIAGNOSIS — K766 Portal hypertension: Secondary | ICD-10-CM | POA: Diagnosis present

## 2011-01-28 DIAGNOSIS — E119 Type 2 diabetes mellitus without complications: Secondary | ICD-10-CM | POA: Diagnosis present

## 2011-01-28 LAB — COMPREHENSIVE METABOLIC PANEL
AST: 70 U/L — ABNORMAL HIGH (ref 0–37)
Albumin: 3.3 g/dL — ABNORMAL LOW (ref 3.5–5.2)
Calcium: 10.4 mg/dL (ref 8.4–10.5)
Creatinine, Ser: 0.74 mg/dL (ref 0.4–1.5)
GFR calc Af Amer: >60 mL/min (ref >60)
Total Protein: 7.8 g/dL (ref 6.0–8.3)

## 2011-01-28 LAB — DIFFERENTIAL
Basophils Absolute: 0 10*3/uL (ref 0.0–0.1)
Basophils Relative: 1 % (ref 0–1)
Lymphocytes Relative: 26 % (ref 12–46)
Monocytes Relative: 10 % (ref 3–12)
Neutro Abs: 4.1 10*3/uL (ref 1.7–7.7)
Neutrophils Relative %: 62 % (ref 43–77)

## 2011-01-28 LAB — URINE MICROSCOPIC-ADD ON

## 2011-01-28 LAB — URINALYSIS, ROUTINE W REFLEX MICROSCOPIC
Glucose, UA: NEGATIVE mg/dL
Hgb urine dipstick: NEGATIVE
Specific Gravity, Urine: >1.03 — ABNORMAL HIGH (ref 1.005–1.030)
Urobilinogen, UA: 0.2 mg/dL (ref 0.0–1.0)

## 2011-01-28 LAB — CK TOTAL AND CKMB (NOT AT ARMC)
CK, MB: 4.3 ng/mL — ABNORMAL HIGH (ref 0.3–4.0)
Total CK: 807 U/L — ABNORMAL HIGH (ref 7–232)

## 2011-01-28 LAB — CBC
Hemoglobin: 12 g/dL — ABNORMAL LOW (ref 13.0–17.0)
RBC: 3.6 MIL/uL — ABNORMAL LOW (ref 4.22–5.81)

## 2011-01-28 LAB — TROPONIN I: Troponin I: <0.3 ng/mL (ref <0.30)

## 2011-01-29 ENCOUNTER — Inpatient Hospital Stay (HOSPITAL_COMMUNITY): Payer: Medicare Other

## 2011-01-29 DIAGNOSIS — K7460 Unspecified cirrhosis of liver: Secondary | ICD-10-CM

## 2011-01-29 LAB — GLUCOSE, CAPILLARY
Glucose-Capillary: 101 mg/dL — ABNORMAL HIGH (ref 70–99)
Glucose-Capillary: 109 mg/dL — ABNORMAL HIGH (ref 70–99)
Glucose-Capillary: 88 mg/dL (ref 70–99)
Glucose-Capillary: 90 mg/dL (ref 70–99)

## 2011-01-29 LAB — DIFFERENTIAL
Basophils Absolute: 0 10*3/uL (ref 0.0–0.1)
Eosinophils Absolute: 0.1 10*3/uL (ref 0.0–0.7)
Lymphs Abs: 1.9 10*3/uL (ref 0.7–4.0)
Neutrophils Relative %: 46 % (ref 43–77)

## 2011-01-29 LAB — CBC
MCV: 101.9 fL — ABNORMAL HIGH (ref 78.0–100.0)
Platelets: 76 10*3/uL — ABNORMAL LOW (ref 150–400)
RBC: 3.12 MIL/uL — ABNORMAL LOW (ref 4.22–5.81)
WBC: 5 10*3/uL (ref 4.0–10.5)

## 2011-01-29 LAB — COMPREHENSIVE METABOLIC PANEL
ALT: 20 U/L (ref 0–53)
AST: 64 U/L — ABNORMAL HIGH (ref 0–37)
Albumin: 2.8 g/dL — ABNORMAL LOW (ref 3.5–5.2)
Alkaline Phosphatase: 95 U/L (ref 39–117)
GFR calc Af Amer: >60 mL/min (ref >60)
Glucose, Bld: 91 mg/dL (ref 70–99)
Potassium: 3.5 mEq/L (ref 3.5–5.1)
Sodium: 142 mEq/L (ref 135–145)
Total Protein: 6.5 g/dL (ref 6.0–8.3)

## 2011-01-29 LAB — MRSA PCR SCREENING: MRSA by PCR: NEGATIVE

## 2011-01-29 LAB — AMMONIA: Ammonia: 70 umol/L — ABNORMAL HIGH (ref 11–60)

## 2011-01-29 NOTE — H&P (Signed)
NAMEMECCA, BARGA NO.:  [id number]  MEDICAL RECORD NO.:  [id number]  LOCATION:  IC12                          FACILITY:  [facility]  PHYSICIAN:  Isidor Holts, M.D.  DATE OF BIRTH:  09-Sep-1960  DATE OF ADMISSION:  01/28/2011 DATE OF DISCHARGE:  LH                             HISTORY & PHYSICAL   PRIMARY CARE PROVIDER:  Christel Mormon PA, [facility].  PRIMARY NEUROLOGIST:  Kofi A. Gerilyn Pilgrim, MD  PRIMARY GASTROENTEROLOGIST:  Lionel December, MD  CHIEF COMPLAINT:  Altered mental status for 2 days.  HISTORY OF PRESENT ILLNESS:  This is a 50 year old male.  For past medical history, see below.  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 He was subsequently discharged to nursing facility in satisfactory condition and was there for approximately 3 weeks, after which he returned home.  Since then, according to his spouse, who supplied the history, the patient has been in relatively stable state of health. However, in the past 2 days, he has become hypersomnolent, sleeping all the time, not eating and as a matter of fact, has had no bowel movements over the past 4 days.  There was no abdominal pain or vomiting.  No fever, coughing or shortness of breath.  According to spouse, the patient stated he was "spitting blood" few days ago.  At the present time, the patient is difficult to rouse and therefore unable to contribute to history.  PAST MEDICAL HISTORY: 1. Hepatic cirrhosis, secondary to nonalcoholic steatohepatitis. 2. Chronic thrombocytopenia/coagulopathy secondary to hepatic     cirrhosis. 3. Recurrent portal systemic encephalopathy. 4. Asymptomatic cholelithiasis. 5. Multifactorial gait disorder/recurrent falls. 6. Query complex partial seizures, diagnosed in December 2011. 7. Right hand fourth and  fifth metacarpal fractures in December 2011. 8. Cerebrovascular disease/right vertebral artery occlusion on brain     MRI/MRA in December 2011. 9. Mild mental retardation. 10.Severe hearing impairment. 11.Right eye blindness. 12.Insulin-requiring type 2 diabetes mellitus. 13.Dyslipidemia. 14.Hypertension. 15.GERD. 16.Smoking history. 17.Chronic back pain. 18.Depression/anxiety. 19.Morbid obesity.  ALLERGIES:  PENICILLIN, it causes itching.  MEDICATION HISTORY: 1. Potassium chloride 20 mEq p.o. daily. 2. Keppra 500 mg p.o. daily. 3. Metoprolol tartrate 50 mg p.o. daily. 4. Lorazepam 0.5 mg p.o. at bedtime. 5. Zetia 10 mg p.o. daily. 6. Amlodipine 5 mg p.o. daily. 7. Omeprazole 20 mg p.o. daily. 8. Glimepiride 2 mg p.o. daily. 9. Fluoxetine 20 mg p.o. daily. 10.Xifaxan 550 mg p.o. b.i.d. 11.Hydrochlorothiazide 5 mg p.o. daily. 12.Furosemide 20 mg p.o. daily. 13.Lantus 25 units subcutaneously b.i.d. 14.Lactulose 60 mL p.o. t.i.d.  REVIEW OF SYSTEMS:  As per HPI and chief complaint.  Unable to obtain any further information.SOCIAL HISTORY:  The patient is married and [place] his [place]  He is on disability.  Has no offspring.  He is a smoker.  Smokes approximately 1 to 2 packets of cigarettes per day. Does not drink any alcohol.  Has no history of drug abuse.  FAMILY HISTORY:  The patient's mother deceased at 92 years, status post MI.  His father deceased at 46  years due to post ruptured intracranial aneurysm.  PHYSICAL EXAMINATION:  VITAL SIGNS:  Temperature 98.3, pulse 102 per minute, regular, respiratory rate 19, BP 172/103 mmHg, rechecked 155/79 mmHg, pulse oximeter 96% on room air. GENERAL:  The patient was very drowsy, difficult to arouse, even when aroused, very hard of hearing, therefore difficult to communicate with him, falls asleep again soon after. HEENT:  No clinical pallor.  No jaundice.  No conjunctival injection. Throat  unable to visualize secondary to lack of cooperation.  However, visible mucous membranes appear "dry." NECK:  Supple.  Unable to see JVP. CHEST:  Clinically clear to auscultation.  No wheezes.  No crackles. HEART:  Heart sounds S1 and S2 heard, normal and regular, mildly tachycardic.  No murmurs. ABDOMEN:  Moderately obese, soft.  Does not appear tense.  Unable to appreciate fluid thrill.  Unable to palpate organs. EXTREMITIES:  Lower extremity examination, no pitting edema.  Palpable peripheral pulses. MUSCULOSKELETAL SYSTEM:  Unable to formally examined. CENTRAL NERVOUS SYSTEM:  The patient is unable to cooperate with examination.  Therefore, unable to elicit asterixis or constructive dyspraxia.  INVESTIGATIONS:  CBC WBC 6.5, hemoglobin 12.0, hematocrit 36.6, platelets 92.  Electrolytes sodium 137, potassium 4.0, chloride 99, CO2 30, BUN 10, creatinine 0.74, glucose 149.  Total bilirubin 1.6, alkaline phosphatase 119, AST 70, ALT 25.  Total creatine kinase 807.  Troponin I point-of-care less than 0.30.  Chest x-ray January 28, 2011 shows cardiomegaly, but no active findings.  Head CT scan on January 28, 2011 shows no acute findings.  ASSESSMENT AND PLAN: 1. Altered mental status.  Most likely, this is secondary to recurrent     portosystemic encephalopathy, given history of previous occurrences,     recent constipation and possible hematemesis.  We shall admit the     patient to the intensive care unit for close observation.  As he is     somewhat obtunded at the present time, we shall administer     lactulose via tube.  We have ordered stat ammonium levels.  We will     check coagulation profile, hold diuretics for now and arrange for     brain MRI in the morning, to further evaluate altered mental status     and to rule out cerebrovascular accident.  2. Uncontrolled hypertension.  We shall manage with parenteral     antihypertensive medications for now.  3. History of seizure  disorder.  We shall check Keppra levels.     Continue in preadmission dosage and do EEG.  4. Insulin-requiring type 2 diabetes mellitus.  Random blood glucose     is 179 at this point, effectively ruling out hypoglycemia as     possible etiology.  We shall do serial CBGs.  We will place the     patient on sliding scale insulin coverage.  5. Smoking history.  We shall manage with NicoDerm CQ patch.  6. Query gastrointestinal bleed.  We shall do stool guaiacs and     consult gastroenterologist, as this patient may need EGD essentially     to rule out varices.  7. Possible urinary tract infection.  The patient's urine appears     concentrated at the present time, and this may be contributory.  We     shall therefore send off urinalysis, culture and sensitivities and     do blood cultures.  If urinalysis is positive, we shall place the     patient on appropriate antibiotic therapy.  I have discussed  the patient's code status with his spouse and she confirmed that the patient is DNR.  We shall respect this status, during the course of the patient's hospitalization.    Further management will depend on clinical course.     Isidor Holts, M.D.     CO/MEDQ  D:  01/28/2011  T:  01/28/2011  Job:  [id number]  cc:   Silas Sacramento, NP Fax: [phone]  Kofi A. Gerilyn Pilgrim, M.D. Fax: [phone]  Lionel December, M.D. Fax: [phone]  Electronically Signed by Isidor Holts M.D. on 01/29/2011 03:16:23 PM

## 2011-01-30 LAB — BASIC METABOLIC PANEL
BUN: 5 mg/dL — ABNORMAL LOW (ref 6–23)
CO2: 29 mEq/L (ref 19–32)
Chloride: 105 mEq/L (ref 96–112)
Creatinine, Ser: 0.64 mg/dL (ref 0.4–1.5)
GFR calc Af Amer: >60 mL/min (ref >60)
Potassium: 3.5 mEq/L (ref 3.5–5.1)

## 2011-01-30 LAB — GLUCOSE, CAPILLARY
Glucose-Capillary: 84 mg/dL (ref 70–99)
Glucose-Capillary: 117 mg/dL — ABNORMAL HIGH (ref 70–99)

## 2011-01-30 LAB — PROTIME-INR: Prothrombin Time: 18.3 seconds — ABNORMAL HIGH (ref 11.6–15.2)

## 2011-01-30 LAB — CBC
Hemoglobin: 11.4 g/dL — ABNORMAL LOW (ref 13.0–17.0)
MCH: 33.6 pg (ref 26.0–34.0)
MCV: 101.5 fL — ABNORMAL HIGH (ref 78.0–100.0)
Platelets: 75 10*3/uL — ABNORMAL LOW (ref 150–400)
RBC: 3.39 MIL/uL — ABNORMAL LOW (ref 4.22–5.81)
WBC: 4.9 10*3/uL (ref 4.0–10.5)

## 2011-01-30 LAB — AFP TUMOR MARKER: AFP-Tumor Marker: 5.7 ng/mL (ref 0.0–8.0)

## 2011-01-30 LAB — AMMONIA: Ammonia: 78 umol/L — ABNORMAL HIGH (ref 11–60)

## 2011-01-31 DIAGNOSIS — K7290 Hepatic failure, unspecified without coma: Secondary | ICD-10-CM

## 2011-01-31 DIAGNOSIS — K7030 Alcoholic cirrhosis of liver without ascites: Secondary | ICD-10-CM

## 2011-01-31 LAB — COMPREHENSIVE METABOLIC PANEL
AST: 58 U/L — ABNORMAL HIGH (ref 0–37)
Albumin: 2.7 g/dL — ABNORMAL LOW (ref 3.5–5.2)
BUN: 5 mg/dL — ABNORMAL LOW (ref 6–23)
Calcium: 8.8 mg/dL (ref 8.4–10.5)
Creatinine, Ser: 0.64 mg/dL (ref 0.4–1.5)
Total Bilirubin: 2.1 mg/dL — ABNORMAL HIGH (ref 0.3–1.2)
Total Protein: 6.6 g/dL (ref 6.0–8.3)

## 2011-01-31 LAB — GLUCOSE, CAPILLARY: Glucose-Capillary: 116 mg/dL — ABNORMAL HIGH (ref 70–99)

## 2011-01-31 LAB — POCT OCCULT BLOOD STOOL (DEVICE): Fecal Occult Bld: NEGATIVE

## 2011-01-31 LAB — AMMONIA: Ammonia: 81 umol/L — ABNORMAL HIGH (ref 11–60)

## 2011-01-31 LAB — URINE CULTURE: Culture  Setup Time: 201206071830

## 2011-02-01 ENCOUNTER — Inpatient Hospital Stay (HOSPITAL_COMMUNITY): Payer: Medicare Other

## 2011-02-01 DIAGNOSIS — K7460 Unspecified cirrhosis of liver: Secondary | ICD-10-CM

## 2011-02-01 DIAGNOSIS — K7290 Hepatic failure, unspecified without coma: Secondary | ICD-10-CM

## 2011-02-01 LAB — GLUCOSE, CAPILLARY
Glucose-Capillary: 123 mg/dL — ABNORMAL HIGH (ref 70–99)
Glucose-Capillary: 157 mg/dL — ABNORMAL HIGH (ref 70–99)
Glucose-Capillary: 97 mg/dL (ref 70–99)

## 2011-02-01 LAB — CBC
HCT: 34.1 % — ABNORMAL LOW (ref 39.0–52.0)
Hemoglobin: 11.4 g/dL — ABNORMAL LOW (ref 13.0–17.0)
RBC: 3.4 MIL/uL — ABNORMAL LOW (ref 4.22–5.81)
RDW: 15.4 % (ref 11.5–15.5)
WBC: 4.2 10*3/uL (ref 4.0–10.5)

## 2011-02-01 LAB — BASIC METABOLIC PANEL
BUN: 6 mg/dL (ref 6–23)
CO2: 27 mEq/L (ref 19–32)
Chloride: 107 mEq/L (ref 96–112)
GFR calc Af Amer: >60 mL/min (ref >60)
Potassium: 3.6 mEq/L (ref 3.5–5.1)

## 2011-02-01 LAB — POCT OCCULT BLOOD STOOL (DEVICE): Fecal Occult Bld: NEGATIVE

## 2011-02-01 LAB — AMMONIA: Ammonia: 45 umol/L (ref 11–60)

## 2011-02-02 ENCOUNTER — Encounter: Payer: Medicare Other | Admitting: Internal Medicine

## 2011-02-02 ENCOUNTER — Inpatient Hospital Stay (HOSPITAL_COMMUNITY): Payer: Medicare Other

## 2011-02-02 ENCOUNTER — Other Ambulatory Visit: Payer: Self-pay | Admitting: Internal Medicine

## 2011-02-02 DIAGNOSIS — R933 Abnormal findings on diagnostic imaging of other parts of digestive tract: Secondary | ICD-10-CM

## 2011-02-02 DIAGNOSIS — K2960 Other gastritis without bleeding: Secondary | ICD-10-CM

## 2011-02-02 HISTORY — PX: ESOPHAGOGASTRODUODENOSCOPY: SHX1529

## 2011-02-02 LAB — BASIC METABOLIC PANEL
BUN: 7 mg/dL (ref 6–23)
CO2: 30 mEq/L (ref 19–32)
Chloride: 106 mEq/L (ref 96–112)
Creatinine, Ser: 0.59 mg/dL (ref 0.4–1.5)
Glucose, Bld: 88 mg/dL (ref 70–99)

## 2011-02-02 LAB — GLUCOSE, CAPILLARY
Glucose-Capillary: 102 mg/dL — ABNORMAL HIGH (ref 70–99)
Glucose-Capillary: 94 mg/dL (ref 70–99)
Glucose-Capillary: 83 mg/dL (ref 70–99)

## 2011-02-02 LAB — CBC
HCT: 35.5 % — ABNORMAL LOW (ref 39.0–52.0)
MCV: 100.3 fL — ABNORMAL HIGH (ref 78.0–100.0)
RBC: 3.54 MIL/uL — ABNORMAL LOW (ref 4.22–5.81)
WBC: 4.2 10*3/uL (ref 4.0–10.5)

## 2011-02-02 LAB — DIFFERENTIAL
Lymphocytes Relative: 50 % — ABNORMAL HIGH (ref 12–46)
Lymphs Abs: 2.1 10*3/uL (ref 0.7–4.0)
Neutrophils Relative %: 33 % — ABNORMAL LOW (ref 43–77)

## 2011-02-02 MED ORDER — IOHEXOL 300 MG/ML  SOLN
100.0000 mL | Freq: Once | INTRAMUSCULAR | Status: AC | PRN
  Administered 2011-02-02: 100 mL via INTRAVENOUS

## 2011-02-02 MED ORDER — IOHEXOL 300 MG/ML  SOLN: 100.0000 mL | Freq: Once | INTRAMUSCULAR | Status: AC | PRN

## 2011-02-02 NOTE — Group Therapy Note (Signed)
[name], [name] NO.:  [id number]  MEDICAL RECORD NO.:  [id number]  LOCATION:  A214                          FACILITY:  [facility]  PHYSICIAN:  Isidor Holts, M.D.  DATE OF BIRTH:  March 08, 1961  DATE OF PROCEDURE: DATE OF DISCHARGE:                                PROGRESS NOTE   PMD:  Mitzi Hansen, PA.  PRIMARY NEUROLOGIST:  Kofi A. Gerilyn Pilgrim, M.D.  PRIMARY GASTROENTEROLOGIST:  Lionel December, M.D.  DISCHARGE DIAGNOSES: 1. Altered mental status, secondary to portosystemic     encephalopathy/hyperammonemia. 2. History of complex partial seizure disorder. 3. Mild mental retardation. 4. Questionable history of hematemesis, status post EGD February 02, 2011,     by Dr. Jena Gauss.  This showed no varices, possible mild portal     gastropathy and extrinsic compression on the gastric antrum.     Biopsies were taken. 5. Hypertension. 6. History of hepatic cirrhosis secondary to nonalcohol excess     hepatitis. 7. Portal hypertension/thrombocytopenia/coagulopathy secondary to     above. 8. Smoking history. 9. Type 2 diabetes mellitus. 10.Dyslipidemia. 11.Gastroesophageal reflux disease. 12.Gait instability. 13.Anxiety/depression. 14.Chronic back pain.  DISCHARGE MEDICATIONS:  These will be listed in addendum at the appropriate time, by discharging MD.  PROCEDURES: 1. Chest x-ray, January 28, 2011, this showed no definite acute process. 2. Head CT scan, January 28, 2011, this showed no acute intracranial     abnormalities.  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  No acute osseous findings. 3. Brain MRI, January 29, 2011, this showed no acute intracranial     abnormalities.  These are again T1 hyperintensity reidentified,     query chronic cirrhosis hepatic insufficiency. 4. EGD, February 02, 2011, performed by Dr. Roetta Sessions, this showed     normal test.  No varices with a mild portal gastropathy extrinsic     compression on the antrum.  A set coverage of  uncertain     significance, status post biopsy with patent pylorus, normal     duodenum 1, duodenum 2. 5. Abdominal/pelvic CT scan, February 02, 2011.  Report was still pending     at the time of this dictation.  CONSULTATION:  Dr. Roetta Sessions, gastroenterologist.  ADMISSION HISTORY:  As in H and P notes of January 28, 2011, dictated by this MD.  However, in brief, this is a 50 year old male, with known history of hepatic cirrhosis secondary to nonalcoholic steatohepatitis, chronic thrombocytopenia since coagulopathy.  Recurrent portosystemic encephalopathy, asymptomatic cholelithiasis.  Multifactorial acute disorder/recurrent falls, brief complex partial seizures diagnosed 07/2010.  Right hand fourth and fifth metacarpal fractures, 07/2010. Cerebrovascular disease/biventricular artery occlusion on brain MRI/MRA, 07/2010, mild mental retardation, severe hearing impairment, right eye blindness, insulin-requiring type 2 diabetes mellitus, dyslipidemia, hypertension, GERD, smoking history, chronic back pain, depression/anxiety, and morbid obesity, presenting with 2 days of altered mental status, described as hypersomnolence, not eating, has had no bowel movements for 4 days, questionable history of mild hematemesis, although this could not be substantiated.  Patient was subsequently admitted for further evaluation, investigation, and management.   CLINICAL COURSE: 1. Altered mental status.  This was due to portosystemic     encephalopathy.  Patient's serum ammonia level was initially 73,     and climbed to a maximum of 81 by January 31, 2011.  Other     differentials included convulsion in status, as well as possible CVA.     Patient underwent brain imaging studies.  For details of findings,     refer to procedure list above.  This showed no evidence of acute     infarct.  No seizure episodes were observed during the course of     this hospitalization and by 01/30/2011, Patient was fully  alert,     therefore EEG was not pursued.  He was managed with high dose     lactulose, with satisfactory clinical response and by February 02, 2011,     ammonia had normalized at 35, and mental status had reverted     completely to baseline.  2. History of complex partial seizure disorder.  As described above, no     seizure episodes were recorded during this hospitalization.  3. Hematemesis.  Patient presented with a questionable history of     "spitting up blood" in the few days prior to admission.  This could     not be substantiated by his spouse, but against background of known     hepatic cirrhosis, it was felt that patient would benefit from GI     evaluation.  Fecal occult blood testing was negative.  No     hematemesis episodes were documented during his hospitalization.     He did however undergo EGD on February 02, 2011, by Dr. Roetta Sessions,     gastroenterologist, who was kind enough to provide a consultation.     For details, refer to procedure list above.  This showed no     evidence of varices, although there was possible mild portal     gastropathy.  Incidentally, an extrinsic impression was noted on     the antrum.  Biopsies were taken.  Patient was ordered     abdominal/pelvic CT scan.  At time of this dictation, report was     still pending.  Patient is on beta-blocker treatment.  4. Hypertension.  This was readily controlled with beta-blocker during     the course of this hospitalization.  5. Smoking history.  The patient does indeed smoke, he has been     counseled appropriately, but it is unclear whether he will actually     be compliant with recommendations to quit.  6. Hepatic cirrhosis.  This is deemed secondary to nonalcoholic     steatohepatitis, although the patient is known to have elevated     anti-mitochondrial antibody of uncertain significance.  Per GI     recommendations, he did have 24-hour urinary copper quantification done.     Unfortunately, results  are still pending at the time of this     dictation.  He did not have any clinical evidence of ascites.     Cirrhosis appears to be well compensated, apart from his recurrent     episodes of portosystemic encephalopathy and alpha-fetoprotein     tumor marker was normal at 5.7.  7. Type 2 diabetes mellitus.  This is insulin-requiring.  As patient's     CBG's were initially borderline low in the initial couple of days     of his hospitalization.  He was placed on intravenous infusion of     D5 half-normal saline, particularly, as he was too somnolent to     maintain adequate  oral intake; however, with normalization of     mental status and adequate oral intake.  He has remained     euglycemic.  Intravenous glucose has since been discontinued,     without any effect.  As a matter of fact in the a.m. of February 02, 2011, his CBG was ranging between 94 and 109.  8. Gastroesophageal reflux disease.  There were no problems referable     to this.  He is on proton pump inhibitor.  9. History of anxiety/depression.  Mood has remained stable.  10.History of chronic back pain, has not problematic during the course     of this hospitalization.  11.Disposition.  Patient as of February 02, 2011, obviously attained     clinical stability.  There were no further active medical issues. As     described above, abdominal/pelvic CT scan recommended by     gastroenterologist was pending at the time of this dictation.       Further management will depend on findings; however, it is anticipated that in the next few     days, patient will be cleared by gastroenterologist, for discharge.     Details will be appended in addendum at the appropriate     time, by discharging MD.     Isidor Holts, M.D.     CO/MEDQ  D:  02/02/2011  T:  02/02/2011  Job:  [id number]  cc:   Mitzi Hansen, PA.  Kofi A. Gerilyn Pilgrim, M.D. Fax: [phone]  Lionel December, M.D. Fax: [phone]  R. Roetta Sessions, MD FACP  [facility] [address] [place] [address]  Electronically Signed by Isidor Holts M.D. on 02/02/2011 05:59:49 PM

## 2011-02-03 DIAGNOSIS — K7460 Unspecified cirrhosis of liver: Secondary | ICD-10-CM

## 2011-02-03 LAB — BASIC METABOLIC PANEL
Calcium: 9.3 mg/dL (ref 8.4–10.5)
GFR calc Af Amer: >60 mL/min (ref >60)
GFR calc non Af Amer: >60 mL/min (ref >60)
Glucose, Bld: 94 mg/dL (ref 70–99)
Potassium: 3.7 mEq/L (ref 3.5–5.1)
Sodium: 140 mEq/L (ref 135–145)

## 2011-02-03 LAB — CULTURE, BLOOD (ROUTINE X 2)

## 2011-02-03 LAB — GLUCOSE, CAPILLARY: Glucose-Capillary: 154 mg/dL — ABNORMAL HIGH (ref 70–99)

## 2011-02-03 LAB — AMMONIA: Ammonia: 45 umol/L (ref 11–60)

## 2011-02-03 NOTE — Discharge Summary (Signed)
  [name], [name]               ACCOUNT NO.:  [id number]  MEDICAL RECORD NO.:  [id number]  LOCATION:  O469                          FACILITY:  [facility]  PHYSICIAN:  Doree Albee, M.D.DATE OF BIRTH:  1961-06-24  DATE OF ADMISSION:  01/28/2011 DATE OF DISCHARGE:  06/13/2012LH                              DISCHARGE SUMMARY   CONDITION ON DISCHARGE:  Stable.  MEDICATIONS ON DISCHARGE: 1. Rifaximin 550 mg b.i.d. 2. Metoprolol 50 mg b.i.d. 3. Alprazolam 0.5 mg t.i.d. 4. Amlodipine 5 mg daily. 5. Fluoxetine 20 mg daily. 6. Lantus insulin 25 units b.i.d. 7. Lactulose 30 mL three times a day. 8. Keppra 500 mg b.i.d. 9. Omeprazole 20 mg daily. 10.Vicodin 5/500 one tablet every 6 hours p.r.n. 11.Stop taking Amaryl.  HISTORY:  Please see dictated progress note from Dr. Sherryl Manges outlining all the discharge diagnoses.  Please note, the patient had a CT scan of his abdomen and pelvis on February 02, 2011.  This showed no acute abnormalities involving the abdomen and pelvis.  There was evidence of hepatic cirrhosis and portal venous hypertension with moderate splenomegaly.  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  There was cholelithiasis with gallbladder wall thickening up to 6 mm which may be due to hepatocellular disease.  There was no obvious evidence of inflammation.  The patient had undergone upper GI endoscopy also on February 02, 2011, and this showed a normal esophagus with no varices.  There was a questionable mild portal gastropathy with possible extrinsic compression on the antrum.  FINAL DISCHARGE DIAGNOSES: 1. Portosystemic hepatic encephalopathy. 2. History of complex partial seizure disorder. 3. Mild mental retardation. 4. Hypertension. 5. 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 6. Diabetes, controlled on insulin. 7. Gastroesophageal reflux disease. 8. Hyperlipidemia. 9. Anxiety, depression. 10.Chronic back pain.  DISCHARGE  LABORATORY DATA:  Ammonia normal at 45.  Sodium 140, potassium 3.7, bicarbonate 29, BUN 6, creatinine 0.57, glucose 94.  Hemoglobin 12.0, white blood cell count 4.2, platelets 74.  DISPOSITION:  The patient can be discharged home but he needs followup appointment with his regular gastroenterologist, Dr. Laural Golden who can review all the findings when he was hospitalized and Dr. Gala Romney was covering.     Doree Albee, M.D.     NCG/MEDQ  D:  02/03/2011  T:  02/03/2011  Job:  [id number]  cc:   Carolee Rota, PA, [facility]  R. Garfield Cornea, MD FACP [facility] [address] [place] [address]  Hildred Laser, M.D. Fax: [phone]  Electronically Signed by Hurshel Party M.D. on 02/03/2011 05:01:45 PM

## 2011-02-11 LAB — COPPER, URINE, 24 HOUR: Creatinine, Urine mg/day-CUURI: 2.41 g/(24.h) (ref 0.63–2.50)

## 2011-02-21 NOTE — Progress Notes (Signed)
Pt belongs to [facility]; he should f/u there; needs urinary cu results sent there too

## 2011-03-03 ENCOUNTER — Encounter: Payer: Self-pay | Admitting: *Deleted

## 2011-03-03 ENCOUNTER — Emergency Department (HOSPITAL_COMMUNITY)
Admission: EM | Admit: 2011-03-03 | Discharge: 2011-03-04 | Disposition: A | Discharge status: Home / Self Care | Payer: Medicare Other | Attending: Emergency Medicine | Admitting: Emergency Medicine

## 2011-03-03 DIAGNOSIS — F17200 Nicotine dependence, unspecified, uncomplicated: Secondary | ICD-10-CM | POA: Insufficient documentation

## 2011-03-03 DIAGNOSIS — F3289 Other specified depressive episodes: Secondary | ICD-10-CM | POA: Insufficient documentation

## 2011-03-03 DIAGNOSIS — F329 Major depressive disorder, single episode, unspecified: Secondary | ICD-10-CM | POA: Insufficient documentation

## 2011-03-03 DIAGNOSIS — I10 Essential (primary) hypertension: Secondary | ICD-10-CM | POA: Insufficient documentation

## 2011-03-03 DIAGNOSIS — F32A Depression, unspecified: Secondary | ICD-10-CM

## 2011-03-03 DIAGNOSIS — E119 Type 2 diabetes mellitus without complications: Secondary | ICD-10-CM | POA: Insufficient documentation

## 2011-03-03 DIAGNOSIS — J45909 Unspecified asthma, uncomplicated: Secondary | ICD-10-CM | POA: Insufficient documentation

## 2011-03-03 HISTORY — DX: Bipolar disorder, unspecified: F31.9

## 2011-03-03 HISTORY — DX: Major depressive disorder, single episode, unspecified: F32.9

## 2011-03-03 HISTORY — DX: Depression, unspecified: F32.A

## 2011-03-03 HISTORY — DX: Unspecified cirrhosis of liver: K74.60

## 2011-03-03 HISTORY — DX: Unspecified osteoarthritis, unspecified site: M19.90

## 2011-03-03 HISTORY — DX: Unspecified convulsions: R56.9

## 2011-03-03 HISTORY — DX: Chronic obstructive pulmonary disease, unspecified: J44.9

## 2011-03-03 HISTORY — DX: Essential (primary) hypertension: I10

## 2011-03-03 LAB — BASIC METABOLIC PANEL
CO2: 32 mEq/L (ref 19–32)
Calcium: 9.1 mg/dL (ref 8.4–10.5)
Chloride: 101 mEq/L (ref 96–112)
Creatinine, Ser: 0.68 mg/dL (ref 0.50–1.35)
GFR calc Af Amer: >60 mL/min (ref >60)
Sodium: 137 mEq/L (ref 135–145)

## 2011-03-03 LAB — HEPATIC FUNCTION PANEL
Alkaline Phosphatase: 139 U/L — ABNORMAL HIGH (ref 39–117)
Indirect Bilirubin: 1.2 mg/dL — ABNORMAL HIGH (ref 0.3–0.9)
Total Bilirubin: 1.9 mg/dL — ABNORMAL HIGH (ref 0.3–1.2)
Total Protein: 7.7 g/dL (ref 6.0–8.3)

## 2011-03-03 LAB — RAPID URINE DRUG SCREEN, HOSP PERFORMED
Amphetamines: NOT DETECTED
Barbiturates: NOT DETECTED
Benzodiazepines: POSITIVE — AB
Cocaine: NOT DETECTED
Tetrahydrocannabinol: NOT DETECTED

## 2011-03-03 LAB — CBC
HCT: 35.2 % — ABNORMAL LOW (ref 39.0–52.0)
MCHC: 34.1 g/dL (ref 30.0–36.0)
Platelets: 82 10*3/uL — ABNORMAL LOW (ref 150–400)
RDW: 15.4 % (ref 11.5–15.5)
WBC: 5.2 10*3/uL (ref 4.0–10.5)

## 2011-03-03 LAB — DIFFERENTIAL
Basophils Absolute: 0.1 10*3/uL (ref 0.0–0.1)
Basophils Relative: 1 % (ref 0–1)
Lymphocytes Relative: 41 % (ref 12–46)
Monocytes Absolute: 0.7 10*3/uL (ref 0.1–1.0)
Neutro Abs: 2.1 10*3/uL (ref 1.7–7.7)
Neutrophils Relative %: 41 % — ABNORMAL LOW (ref 43–77)

## 2011-03-03 LAB — PROTIME-INR: Prothrombin Time: 18.7 seconds — ABNORMAL HIGH (ref 11.6–15.2)

## 2011-03-03 LAB — ETHANOL: Alcohol, Ethyl (B): <11 mg/dL (ref 0–11)

## 2011-03-03 LAB — AMMONIA: Ammonia: 69 umol/L — ABNORMAL HIGH (ref 11–60)

## 2011-03-03 NOTE — ED Notes (Signed)
Pt cooperative and alert.  Resting in room, denies complaints at present time.  Family at bedside.

## 2011-03-03 NOTE — ED Notes (Signed)
Pt states that he has been very depressed off and on since 1990. Pt has history of bipolar. Pt states that everybody in his family is dead. Also states that he has cirrhosis and his ammonia level and his ammonia level keeps going up. Has chronic diarrhea from Lactulose and states that it is no way to live. Pt also has been thinking about finding his mother dead 7 years ago and how she looked. Pt has tried to commit suicide twice and has had recent thoughts of hurting himself.
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
## 2011-03-03 NOTE — ED Notes (Addendum)
[name] [name], with ACT at bedside

## 2011-03-04 NOTE — ED Notes (Signed)
Pt's wife returned call.  Informed of pt's discharge.  Wife to come get pt.  Will review discharge instructions with wife when she arrives

## 2011-03-04 NOTE — ED Notes (Signed)
Attempted to contact pt's wife, Vikki Ports, to inform her that pt was being discharged, so she can come pick him up.  Voice mail left.

## 2011-03-08 NOTE — Op Note (Signed)
  [name] [name], [name] [name]               ACCOUNT NO.:  [id number]  MEDICAL RECORD NO.:  [id number]  LOCATION:                                 FACILITY:  PHYSICIAN:  R. Roetta Sessions, MD FACP FACGDATE OF BIRTH:  [birth year]  DATE OF PROCEDURE:  02/02/2011 DATE OF DISCHARGE:                              OPERATIVE REPORT   ESOPHAGOGASTRODUODENOSCOPY BIOPSY  INDICATIONS FOR PROCEDURE:  This is a 50 year old gentleman with cirrhosis admitted to the hospital with encephalopathy.  Those symptoms have improved.  There was a questionable of very small volume hematemesis or hemoptysis.  At the time of admission, he has not had a screening EGD.  He has history of mental retardation, seizure disorder. 24-hour urinary copper is pending.  EGD is now being done to screen for varices primarily.  Risks, benefits, limitations, alternatives have been discussed, questions answered.  Please see the documentation in the medical record.  PROCEDURE NOTE:  O2 saturation, blood pressure, pulse, respirations were monitored the entire procedure.  Because of his comorbidities, it is being done under deep sedation with propofol.  Cetacaine spray for topical pharyngeal anesthesia.  FINDINGS:  Examination of tubular esophagus revealed no mucosal abnormalities.  There were no esophageal varices.  EG junction was easily traversed and into stomach.  Gastric cavity was emptied and insufflated.  All of the stomach inflated fairly well.  There was apparent extrinsic compression on the distal lesser curvature antral area suggestive of extrinsic mass effect.  There was some overlying nodularity but mucosa looked really pretty good.  We had some trouble with the imaging cables to the monitor and some of the colors may not have been true.  However, no varices, no ulcer, no obvious infiltrating process and the retroflex was performed.  He may have some mild portal gastropathy.  Pylorus was patent, easily traversed.   Examination of the bulb and second portion appeared normal. Therapeutic/diagnostic maneuvers performed.  Biopsies of the nodular antrum were taken for histologic study.  The patient tolerated the procedure well and was taken to the PACU in stable condition.  IMPRESSION: 1. Normal esophagus.  No varices. 2. Question mild portal gastropathy, extrinsic compression on the     antrum, lesser curvature of uncertain significance, some minimally     nodular mucosa status post biopsy, patent pylorus, normal duodenum     1 and duodenum 2.  RECOMMENDATIONS: 1. Advance diet. 2. CT of the abdomen and pelvis with IV and oral contrast to further     screen for hepatoma and further evaluate possible extrinsic     impression on the stomach. 3. Followup on 24-hour urinary copper.  Followup on path.  Further     recommendations to follow.     [name], MD Caleen Essex     RMR/MEDQ  D:  02/02/2011  T:  02/02/2011  Job:  [id number]  cc:   Christel Mormon, PA, [place], Family  Kofi A. Gerilyn Pilgrim, M.D. Fax: [phone]  [facility] Hawking [facility]  Donnetta Hutching, MD [address] [place], [place] [address]  Electronically Signed by Lorrin Goodell M.D. on 03/08/2011 08:40:18 AM

## 2011-05-31 NOTE — Consult Note (Signed)
NAMEBRYANT, [name]               ACCOUNT NO.:  [id number]  MEDICAL RECORD NO.:  [id number]  LOCATION:  IC12                          FACILITY:  [facility]  PHYSICIAN:  R. Garfield Cornea, MD FACP FACGDATE OF BIRTH:  November 13, 1960  DATE OF CONSULTATION:  01/29/2011 DATE OF DISCHARGE:                                CONSULTATION   PHYSICIAN:  R. Garfield Cornea, MD [name]  PRIMARY GASTROENTEROLOGIST:  Hildred Laser, M.D.  PRIMARY CARE PROVIDER:  Linford Arnold. Quillian Quince, [place] at [facility].  NEUROLOGIST:  Kofi A. Merlene Laughter, M.D.  REASON FOR CONSULTATION:  Hepatic encephalopathy, cirrhosis, question of GI bleed.  HISTORY OF PRESENT ILLNESS:  [name] [name] is a 50 year old Caucasian male who was recently diagnosed with cirrhosis in December 2011, when he was brought to the hospital with confusion.  He was admitted again in February 2012 and seen by Dr. Laural Golden.  He had a multitude of laboratory studies performed including positive AMA, negative ANA, negative viral hepatitis markers, negative ceruloplasmin, negative (high) alpha one antitrypsin and an abdominal ultrasound in December 2011, which showed cholelithiasis, gallbladder wall thickening, and hepatic steatosis.  He has been maintained at home on lactulose 60 mL t.i.d.  He was also taking Xifaxan 550 mg b.i.d.  His wife tells me he has been more confused over the last 2 days.  He was unable to get out of the bed.  He was having frequent falls and therefore she did not give him his lactulose.  He was previously having multiple bowel movements a days; however, this has decreased.  He did not have bowel movement yesterday until here in the hospital.  His weight has remained stable per his wife.  He was having severe confusion and hallucination, which has been intermittent over the last 2 years.  There was no complaint of abdominal pain, nausea, or vomiting.  He was previously eating his meals.  He does not consume significant amount of  protein.  There were no new meds; however, his Vicodin was increased from 5/500 mg to 7.5/325 mg every 6 hours and she notes him taking it about twice per day.  He is diabetic and she tells me his blood sugars have been normal.  She has seen him "spitting up blood."  Denies any history of melena.  Denies any fever or chills.  The patient was unable to give history and history was obtained from [name], wife, and medical record.  PAST MEDICAL AND SURGICAL HISTORY:  Cirrhosis, type 2 diabetes mellitus since 1990, cardiomegaly, hypertension, seizures, depression, anxiety, GERD, tobacco abuse history, chronic back pain, and arthritis.  He has history of hepatic encephalopathy, thrombocytopenia, and coagulopathy secondary to cirrhosis.  He has asymptomatic cholelithiasis and frequent falls.  He has right fourth and fifth metacarpal fractures.  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  He is blind in the right eye.  He is significantly hard of hearing and has hyperlipidemia.  He has had ear surgery.  MEDICATIONS PRIOR TO ADMISSION:  Potassium chloride 20 mEq daily, levetiracetam 500 mg daily, metoprolol 50 mg daily, lorazepam 3.5 mg q.h.s., Zetia 10 mg daily, amlodipine 5 mg daily, omeprazole  20 mg daily, glimepiride 2 mg daily, ofloxacin 20 mg daily, Xifaxan 550 mg b.i.d., hydrochlorothiazide 25 mg daily, furosemide 20 mg daily, Lantus 25 units b.i.d., lactulose 60 mL t.i.d.  ALLERGIES:  PENICILLIN.  FAMILY HISTORY:  Noncontributory.  SOCIAL HISTORY:  [name] is married.  He has smoked for about 30 years.  There is no alcohol or drug use.  SOCIAL HISTORY:  [name] does not have any children.  REVIEW OF SYSTEMS:  See HPI.  Unable to obtain from the patient who is nonverbal.  PHYSICAL EXAMINATION:  VITAL SIGNS:  Temperature 99.1, pulse 104, respirations 20, blood pressure 126/63, O2 sat 94% on 2 L via nasal cannula.  His weight is  116.1 kg and height is 72 inches.  GENERAL:  [name] is an obese Caucasian male who is quite lethargic.  He does not respond to verbal tactile stimuli.  HEENT:  Right sclera is cloudy.  He has good pupil response on the left.  Conjunctiva are somewhat pale.  He does have mild scleral icterus.  Oropharynx is pink and moist.  NECK: Supple without mass or thyromegaly.  HEART:  Regular rate and rhythm. Normal S1, S2.  No murmurs, rubs, or gallops.  LUNGS:  Clear to auscultation bilaterally.  ABDOMEN:  Protuberant.  He does have positive bowel sounds x4.  No bruits auscultated.  Unable to palpate hepatosplenomegaly.  Exam is limited given the patient's body habitus. EXTREMITIES:  He has 2+ pretibial edema bilaterally and clubbing.  LABORATORY STUDIES:  Hemoglobin 10.5, hematocrit 31.8, platelets 76. White blood cell count 5, calcium 9.1, sodium 142, potassium 3.5, chloride 104, CO2 of 32.  BUN 9 and creatinine 0.64, glucose 91, total bilirubin 1.5, AST 64, albumin 2.8, and otherwise normal LFTs.  His ammonia level is 70.  Urinalysis shows small amount of bilirubin and protein.  He had a negative head CT and has an MRI scheduled for today.  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  Recent change in hydrocodone dose for chronic pain along with noncompliance of lactulose is likely culprit contributing to his encephalopathy.  He has not had outpatient followup for his liver disease.  He has seen Dr. Laural Golden previously, inpatient.  He has history of diabetes mellitus, hyperlipidemia, obesity, and a positive AMA and is otherwise negative workup for liver disease.  He has never had an EGD.  His wife noted some "spitting up of some blood" without any history of melena.  His hemoglobin has been stable around 10.5.  He has subsequent thrombocytopenia and I do not have a recent INR.  His last abdominal ultrasound was in  December 2011.  He is due for screening for hepatocellular carcinoma and also will need an AFP.  I am unsure at this point whether it did not appear at this point that he has been screened for varices, but there is no overt GI bleeding at this point.  PLAN: 1. We will discuss EGD for screening for varices with Dr. Gala Romney in the     near future. 2. Since he is unable to tolerate p.o. lactulose with 300 mL lactulose     retention enemas q.4 h until he is able to take by mouth. 3. Check AFP and INR in the a.m. 4. He is due for abdominal ultrasound. 5. We will follow up with CBC and CMET already ordered from     hospitalist. 6. His narcotic pain medication use is contributing  to his     encephalopathy and this should be used very sparingly if at all.     This included Vicodin for chronic pain, as well as his nightly     lorazepam.     Vickey Huger, N.P.   ______________________________ R. Garfield Cornea, MD FACP Marval Regal    KJ/MEDQ  D:  01/29/2011  T:  01/29/2011  Job:  [id number]  cc:   Trey Sailors A. Merlene Laughter, M.D. Fax: [phone]  Hildred Laser, M.D. Fax: [phone]  Carolee Rota, PA  Electronically Signed by Vickey Huger N.P. on 04/06/2011 04:09:23 PM Electronically Signed by Jannette Spanner M.D. on 05/31/2011 09:34:58 AM

## 2011-07-28 ENCOUNTER — Telehealth: Payer: Self-pay | Admitting: Internal Medicine

## 2011-07-28 NOTE — Telephone Encounter (Signed)
[facility] clinic called, pt needs appt for lab and MD. RN will fax lab result to Dr. Worthy Flank nurse, Pt scheduled on 09/06/11, pt will be notified by clinic

## 2011-08-01 ENCOUNTER — Emergency Department (HOSPITAL_COMMUNITY)
Admission: EM | Admit: 2011-08-01 | Discharge: 2011-08-01 | Disposition: A | Discharge status: Home / Self Care | Payer: Medicare Other | Attending: Emergency Medicine | Admitting: Emergency Medicine

## 2011-08-01 ENCOUNTER — Emergency Department (HOSPITAL_COMMUNITY): Payer: Medicare Other

## 2011-08-01 DIAGNOSIS — F17200 Nicotine dependence, unspecified, uncomplicated: Secondary | ICD-10-CM | POA: Insufficient documentation

## 2011-08-01 DIAGNOSIS — K219 Gastro-esophageal reflux disease without esophagitis: Secondary | ICD-10-CM | POA: Insufficient documentation

## 2011-08-01 DIAGNOSIS — S82841A Displaced bimalleolar fracture of right lower leg, initial encounter for closed fracture: Secondary | ICD-10-CM

## 2011-08-01 DIAGNOSIS — J4489 Other specified chronic obstructive pulmonary disease: Secondary | ICD-10-CM | POA: Insufficient documentation

## 2011-08-01 DIAGNOSIS — F79 Unspecified intellectual disabilities: Secondary | ICD-10-CM | POA: Insufficient documentation

## 2011-08-01 DIAGNOSIS — J449 Chronic obstructive pulmonary disease, unspecified: Secondary | ICD-10-CM | POA: Insufficient documentation

## 2011-08-01 DIAGNOSIS — H5440 Blindness, one eye, unspecified eye: Secondary | ICD-10-CM | POA: Insufficient documentation

## 2011-08-01 DIAGNOSIS — S82843A Displaced bimalleolar fracture of unspecified lower leg, initial encounter for closed fracture: Secondary | ICD-10-CM | POA: Insufficient documentation

## 2011-08-01 DIAGNOSIS — K7460 Unspecified cirrhosis of liver: Secondary | ICD-10-CM | POA: Insufficient documentation

## 2011-08-01 DIAGNOSIS — W010XXA Fall on same level from slipping, tripping and stumbling without subsequent striking against object, initial encounter: Secondary | ICD-10-CM | POA: Insufficient documentation

## 2011-08-01 DIAGNOSIS — R569 Unspecified convulsions: Secondary | ICD-10-CM | POA: Insufficient documentation

## 2011-08-01 DIAGNOSIS — I10 Essential (primary) hypertension: Secondary | ICD-10-CM | POA: Insufficient documentation

## 2011-08-01 DIAGNOSIS — F319 Bipolar disorder, unspecified: Secondary | ICD-10-CM | POA: Insufficient documentation

## 2011-08-01 DIAGNOSIS — G8929 Other chronic pain: Secondary | ICD-10-CM | POA: Insufficient documentation

## 2011-08-01 HISTORY — DX: Hepatic encephalopathy: K76.82

## 2011-08-01 HISTORY — DX: Unspecified intellectual disabilities: F79

## 2011-08-01 HISTORY — DX: Hepatic failure, unspecified without coma: K72.90

## 2011-08-01 HISTORY — DX: Blindness, one eye, unspecified eye: H54.40

## 2011-08-01 HISTORY — DX: Other chronic pain: G89.29

## 2011-08-01 HISTORY — DX: Dorsalgia, unspecified: M54.9

## 2011-08-01 HISTORY — DX: Gastro-esophageal reflux disease without esophagitis: K21.9

## 2011-08-01 MED ORDER — HYDROCODONE-ACETAMINOPHEN 5-325 MG PO TABS
1.0000 | ORAL_TABLET | Freq: Once | ORAL | Status: AC
  Administered 2011-08-01: 1 via ORAL
  Filled 2011-08-01: qty 1

## 2011-08-01 MED ORDER — HYDROCODONE-ACETAMINOPHEN 5-325 MG PO TABS: ORAL_TABLET | ORAL | Status: DC

## 2011-08-01 NOTE — ED Notes (Signed)
Pt states he was walking and his ankle gave out and he fell

## 2011-08-01 NOTE — ED Provider Notes (Signed)
History     CSN: [id number] Arrival date & time: 08/01/2011  5:23 PM   First MD Initiated Contact with Patient 08/01/11 1730      Chief Complaint  Patient presents with  . Ankle Pain     HPI Pt was seen at 1910.  Per pt and family, c/o gradual onset and persistence of constant right ankle "pain" that began today PTA.  Pt states he slipped and fell while walking from concrete to grass.  Denies hitting head, no LOC.  Family reports pt is acting per his baseline.  Denies any other injuries.  Denies tingling/numbness in extremities, no CP, no abd pain, no back pain.   Past Medical History  Diagnosis Date  . Depression   . Cirrhosis   . Bipolar 1 disorder   . Hypertension   . Diabetes mellitus   . Seizures   . COPD (chronic obstructive pulmonary disease)   . Mental retardation   . Blind right eye   . GERD (gastroesophageal reflux disease)   . Chronic back pain   . Hepatic encephalopathy     Past Surgical History  Procedure Date  . Tonsillectomy   . Ear operations     Family History  Problem Relation Age of Onset  . Heart failure Mother   . Thyroid disease Mother   . Cancer Father     History  Substance Use Topics  . Smoking status: Current Everyday Smoker -- 1.0 packs/day for 30 years  . Smokeless tobacco: Not on file  . Alcohol Use: No    Review of Systems ROS: Statement: All systems negative except as marked or noted in the HPI; Constitutional: Negative for fever and chills. ; ; Eyes: Negative for eye pain, redness and discharge. ; ; ENMT: Negative for ear pain, hoarseness, nasal congestion, sinus pressure and sore throat. ; ; Cardiovascular: Negative for chest pain, palpitations, diaphoresis, dyspnea and peripheral edema. ; ; Respiratory: Negative for cough, wheezing and stridor. ; ; Gastrointestinal: Negative for nausea, vomiting, diarrhea, abdominal pain, blood in stool, hematemesis, jaundice and rectal bleeding. . ; ; Genitourinary: Negative for dysuria, flank  pain and hematuria. ; ; Musculoskeletal: Negative for back pain and neck pain. +right ankle pain; ; Skin: Negative for pruritus, rash, abrasions, blisters, bruising and skin lesion.; ; Neuro: Negative for headache, lightheadedness and neck stiffness. Negative for weakness, altered level of consciousness , altered mental status, extremity weakness, paresthesias, involuntary movement, seizure and syncope.      Allergies  Penicillins  Home Medications   Current Outpatient Rx  Name Route Sig Dispense Refill  . ALPRAZOLAM 0.5 MG PO TABS Oral Take 0.5 mg by mouth 3 (three) times daily.      Marland Kitchen EZETIMIBE 10 MG PO TABS Oral Take 10 mg by mouth daily.      Marland Kitchen FLUOXETINE HCL 20 MG PO CAPS Oral Take 20 mg by mouth daily.      . FUROSEMIDE 20 MG PO TABS Oral Take 20 mg by mouth daily.      Marland Kitchen GLIMEPIRIDE 2 MG PO TABS Oral Take 2 mg by mouth daily before breakfast.      . HYDROCODONE-ACETAMINOPHEN 5-325 MG PO TABS Oral Take 1 tablet by mouth every 8 (eight) hours as needed. pain    . LACTULOSE 10 GM/15ML PO SOLN Oral Take 20 g by mouth 3 (three) times daily.      Marland Kitchen LEVETIRACETAM 500 MG PO TABS Oral Take 500 mg by mouth 2 (two) times daily.      Marland Kitchen  OMEPRAZOLE 20 MG PO CPDR Oral Take 20 mg by mouth daily.      Marland Kitchen POTASSIUM CHLORIDE CRYS CR 20 MEQ PO TBCR Oral Take 20 mEq by mouth daily.      Marland Kitchen RIFAXIMIN 200 MG PO TABS Oral Take 550 mg by mouth 2 (two) times daily.      Marland Kitchen HYDROCODONE-ACETAMINOPHEN 5-325 MG PO TABS  1 tab PO q6 hours prn pain 20 tablet 0    BP 124/56  Pulse 64  Temp(Src) 98.2 F (36.8 C) (Oral)  Resp 18  SpO2 98%  Physical Exam 1915: Physical examination:  Nursing notes reviewed; Vital signs and O2 SAT reviewed;  Constitutional: Well developed, Well nourished, Well hydrated, In no acute distress; Head:  Normocephalic, atraumatic; Eyes: EOMI, PERRL, No scleral icterus; ENMT: Mouth and pharynx normal, Mucous membranes moist; Neck: Supple, Full range of motion, No lymphadenopathy;  Cardiovascular: Regular rate and rhythm, No murmur, rub, or gallop; Respiratory: Breath sounds clear & equal bilaterally, No rales, rhonchi, wheezes, or rub, Normal respiratory effort/excursion; Chest: Nontender, Movement normal; Abdomen: Soft, Nontender, Nondistended, Normal bowel sounds; Extremities: Pulses normal, +TTP right lateral ankle area with localized edema, no open wounds.  No right knee, proximal fibula or foot tenderness to palp.  Strong right pedal pulse, brisk cap refill in right toes, skin W/D/good color.  No calf edema or asymmetry.; Neuro: AA&Ox3, +HOH, hx blind right eye.  No facial droop, speech clear.  Moves all ext well with exception of right ankle.; Skin: Color normal, Warm, Dry, no rash.    ED Course  Procedures  MDM  MDM Reviewed: nursing note and vitals Interpretation: x-ray Consults: orthopedics  Dg Ankle Complete Right  08/01/2011  *RADIOLOGY REPORT*  Clinical Data: Fall, pain.  RIGHT ANKLE - COMPLETE 3+ VIEW  Comparison: None.  Findings: There is marked soft tissue swelling about the ankle. The patient has an oblique fracture through the distal diaphysis of the fibula.  There is also a fracture of the posterior malleolus. The anterior aspect of the tibiotalar joint appears abnormally widened.  There is some midfoot degenerative disease and calcaneal spurring.  IMPRESSION: Distal fibular and posterior malleolar fractures as described.  Original Report Authenticated By: Arvid Right. Luther Parody, M.D.    1945:  T/C to Ortho Dr. Aline Brochure, case discussed, including:  HPI, pertinent PM/SHx, VS/PE, dx testing, ED course and treatment.  Agreeable to f/u in office. Pt with hx of MR, concern re: pt attempting to walk on fx ankle; this also d/w Dr. Aline Brochure and may need to place in cam walker (vs post splint) and rx wheelchair, he is agreeable with this and would like to avoid admission if possible.   Long d/w family by myself and ED RN.  Family wants to take pt home.  Will place in cam  walker for safety, rx wheelchair, d/c home with Ortho MD and [place] referral for f/u tomorrow.     [place], DO 08/02/11 1815

## 2011-08-02 ENCOUNTER — Encounter (HOSPITAL_COMMUNITY): Payer: Self-pay | Admitting: Emergency Medicine

## 2011-08-04 ENCOUNTER — Ambulatory Visit (INDEPENDENT_AMBULATORY_CARE_PROVIDER_SITE_OTHER): Payer: Medicare Other | Admitting: Orthopedic Surgery

## 2011-08-04 ENCOUNTER — Encounter: Payer: Self-pay | Admitting: Orthopedic Surgery

## 2011-08-04 VITALS — BP 124/70 | Ht 66.0 in | Wt 245.0 lb

## 2011-08-04 DIAGNOSIS — S82843A Displaced bimalleolar fracture of unspecified lower leg, initial encounter for closed fracture: Secondary | ICD-10-CM | POA: Insufficient documentation

## 2011-08-04 HISTORY — DX: Displaced bimalleolar fracture of unspecified lower leg, initial encounter for closed fracture: S82.843A

## 2011-08-04 MED ORDER — HYDROCODONE-ACETAMINOPHEN 7.5-325 MG PO TABS: 1.0000 | ORAL_TABLET | ORAL | Status: AC | PRN

## 2011-08-04 NOTE — Patient Instructions (Signed)
Wear brace all the time, check skin daily   Use walker

## 2011-08-04 NOTE — Progress Notes (Signed)
Chief complaint broken leg.  50 year old male with multiple medical problems and mental rechallenge present with a new bimalleolar ankle fracture, nondisplaced. Date of injury December 9. Medication hydrocodone 5 mg. Symptoms, sharp throbbing, burning, pain, area pain as a 10 out of 10. Pain is constant. Worse with standing better with elevation. Complains of some numbness and tingling.  Multiple review of system complaints. Nervousness anxiety, depression, easy bleeding, excessive thirst. Seizure. Joint pain, instability, stiffness, and muscle pain. Frequency. Heartburn. Wheezing and snoring. Chest pain. I pain.  Medical problems as noted above, and reviewed.  Exam Physical Exam(12) GENERAL: normal development   CDV: pulses are normal   Skin: normal  Lymph: nodes were not palpable/normal  Psychiatric: awake, alert and oriented  Neuro: normal sensation  MSK He is nonambulatory. He is ambulating with a wheelchair. 1The ankle is swollen and tender in the medial malleolus and lateral malleolus. As a pedis planus, chronic matching the opposite side. 2 Has really no active range of motion at this time because of pain 3 Stability is not assessed because of pain 4 Muscle tone is normal   Assessment: I looked at his x-rays he has a Weber C. Type ankle fracture with a high fibular fracture medial malleolus looks to be involved with avulsion type injury. There is also a posterior malleolar injury. The mortise however, is intact    Plan: Nonweightbearing. I increased his pain medication to 7.5 mg of hydrocodone. He is continued in a Cam Walker followup one week. X-ray.

## 2011-08-05 ENCOUNTER — Ambulatory Visit: Payer: Medicare Other | Admitting: Orthopedic Surgery

## 2011-08-09 ENCOUNTER — Ambulatory Visit (INDEPENDENT_AMBULATORY_CARE_PROVIDER_SITE_OTHER): Payer: Medicare Other | Admitting: Orthopedic Surgery

## 2011-08-09 ENCOUNTER — Encounter: Payer: Self-pay | Admitting: Orthopedic Surgery

## 2011-08-09 VITALS — BP 150/80 | Ht 66.0 in | Wt 245.0 lb

## 2011-08-09 DIAGNOSIS — S82843A Displaced bimalleolar fracture of unspecified lower leg, initial encounter for closed fracture: Secondary | ICD-10-CM

## 2011-08-09 NOTE — Patient Instructions (Signed)
Wear brace

## 2011-08-09 NOTE — Progress Notes (Signed)
Subjective:     Patient ID: [name] [name], male   DOB: Dec 29, 1960, 50 y.o.   MRN: [id number]  HPI Visit x-ray RIGHT ankle her RIGHT ankle fracture  Patient is noted to have multiple medical problems and is a poor surgical candidate.  He was treated with a brace.  He will he has a lateral malleolus fracture although the type C and probably has a medial deltoid injury.   Review of Systems  There may be some bone chips medially as well.     Objective:   Physical Exam Skin is intact though it is very Ecchymotic.  He has a pes planus deformity.     Assessment:       Lateral malleolus fracture In a medically high risk patient who I believe should be treated nonoperatively and LEFT the mortise displaces.    Plan:     X-rays show minimal displacement although there is some translation of the oblique fibula fracture.  The mortise remains intact.  I recommend continued non-operative treatment.  X-ray report  3 views RIGHT ankle reason for x-ray RIGHT ankle fracture.  Findings there is an oblique fibular fracture a Weber type C with soft tissue swelling medially consistent with a medial deltoid fracture.  There may be some bone fragments there as well.  Impression slightly translated nondisplaced RIGHT ankle fracture with mortise still intact.   Note there is a posterior malleolar fragment which is very small and does not affect the treatment.

## 2011-08-24 ENCOUNTER — Encounter (HOSPITAL_COMMUNITY): Payer: Self-pay | Admitting: General Practice

## 2011-08-24 ENCOUNTER — Emergency Department (HOSPITAL_COMMUNITY): Payer: Medicare Other

## 2011-08-24 ENCOUNTER — Emergency Department (HOSPITAL_COMMUNITY)
Admission: EM | Admit: 2011-08-24 | Discharge: 2011-08-24 | Disposition: A | Discharge status: Home / Self Care | Payer: Medicare Other | Attending: Emergency Medicine | Admitting: Emergency Medicine

## 2011-08-24 DIAGNOSIS — R569 Unspecified convulsions: Secondary | ICD-10-CM | POA: Insufficient documentation

## 2011-08-24 DIAGNOSIS — R5381 Other malaise: Secondary | ICD-10-CM | POA: Insufficient documentation

## 2011-08-24 DIAGNOSIS — K7460 Unspecified cirrhosis of liver: Secondary | ICD-10-CM | POA: Insufficient documentation

## 2011-08-24 DIAGNOSIS — K219 Gastro-esophageal reflux disease without esophagitis: Secondary | ICD-10-CM | POA: Insufficient documentation

## 2011-08-24 DIAGNOSIS — F79 Unspecified intellectual disabilities: Secondary | ICD-10-CM | POA: Insufficient documentation

## 2011-08-24 DIAGNOSIS — K5900 Constipation, unspecified: Secondary | ICD-10-CM | POA: Insufficient documentation

## 2011-08-24 DIAGNOSIS — R531 Weakness: Secondary | ICD-10-CM

## 2011-08-24 DIAGNOSIS — F319 Bipolar disorder, unspecified: Secondary | ICD-10-CM | POA: Insufficient documentation

## 2011-08-24 DIAGNOSIS — J449 Chronic obstructive pulmonary disease, unspecified: Secondary | ICD-10-CM | POA: Insufficient documentation

## 2011-08-24 DIAGNOSIS — F17200 Nicotine dependence, unspecified, uncomplicated: Secondary | ICD-10-CM | POA: Insufficient documentation

## 2011-08-24 DIAGNOSIS — M549 Dorsalgia, unspecified: Secondary | ICD-10-CM | POA: Insufficient documentation

## 2011-08-24 DIAGNOSIS — J4489 Other specified chronic obstructive pulmonary disease: Secondary | ICD-10-CM | POA: Insufficient documentation

## 2011-08-24 DIAGNOSIS — G8929 Other chronic pain: Secondary | ICD-10-CM | POA: Insufficient documentation

## 2011-08-24 DIAGNOSIS — R109 Unspecified abdominal pain: Secondary | ICD-10-CM | POA: Insufficient documentation

## 2011-08-24 LAB — DIFFERENTIAL
Eosinophils Absolute: 0.2 10*3/uL (ref 0.0–0.7)
Eosinophils Relative: 3 % (ref 0–5)
Lymphs Abs: 1.2 10*3/uL (ref 0.7–4.0)
Monocytes Absolute: 0.5 10*3/uL (ref 0.1–1.0)

## 2011-08-24 LAB — COMPREHENSIVE METABOLIC PANEL
ALT: 21 U/L (ref 0–53)
BUN: 11 mg/dL (ref 6–23)
CO2: 28 mEq/L (ref 19–32)
Calcium: 10.3 mg/dL (ref 8.4–10.5)
Creatinine, Ser: 0.85 mg/dL (ref 0.50–1.35)
GFR calc Af Amer: >90 mL/min (ref >90)
GFR calc non Af Amer: >90 mL/min (ref >90)
Glucose, Bld: 145 mg/dL — ABNORMAL HIGH (ref 70–99)
Sodium: 141 mEq/L (ref 135–145)
Total Protein: 8.3 g/dL (ref 6.0–8.3)

## 2011-08-24 LAB — CBC
Hemoglobin: 14.3 g/dL (ref 13.0–17.0)
MCHC: 32.6 g/dL (ref 30.0–36.0)
Platelets: 159 10*3/uL (ref 150–400)
RDW: 14.7 % (ref 11.5–15.5)
WBC: 5.6 10*3/uL (ref 4.0–10.5)

## 2011-08-24 LAB — AMMONIA: Ammonia: 52 umol/L (ref 11–60)

## 2011-08-24 MED ORDER — MORPHINE SULFATE 4 MG/ML IJ SOLN
4.0000 mg | Freq: Once | INTRAMUSCULAR | Status: AC
  Administered 2011-08-24: 4 mg via INTRAVENOUS
  Filled 2011-08-24: qty 1

## 2011-08-24 MED ORDER — SODIUM CHLORIDE 0.9 % IV SOLN
Freq: Once | INTRAVENOUS | Status: AC
  Administered 2011-08-24: 13:00:00 via INTRAVENOUS

## 2011-08-24 NOTE — ED Notes (Signed)
Family at bedside. 

## 2011-08-24 NOTE — ED Provider Notes (Signed)
History   This chart was scribed for [name] Speak, MD by [name] [name]. The patient was seen in room APA06/APA06 and the patient's care was started at 11:41AM.   CSN: [id number]  Arrival date & time 08/24/11  1039   First MD Initiated Contact with Patient 08/24/11 1134      Chief Complaint  Patient presents with  . Weakness    (Consider location/radiation/quality/duration/timing/severity/associated sxs/prior treatment) HPI A Level 5 Caveat Applies due to Patient Condition [name] [name] is a 51 y.o. male who presents to the Emergency Department complaining of constant generalized weakness with associated mild abdominal pain. Patient also notes that his "bowels aren't moving right" but denies diarrhea, constipation and hematochezia. Also denies fever, SOB, cough. Patient states he sustained a fracture to right lower leg but denise associated pain or weakness at this time. Patient with h/o cirrhosis, bipolar 1 disorder, hypertension, diabetes, COPD, mental retardation, GERD, hepatic encephalopathy.   Past Medical History  Diagnosis Date  . Depression   . Cirrhosis   . Bipolar 1 disorder   . Hypertension   . Diabetes mellitus   . Seizures   . COPD (chronic obstructive pulmonary disease)   . Mental retardation   . Blind right eye   . GERD (gastroesophageal reflux disease)   . Chronic back pain   . Hepatic encephalopathy     Past Surgical History  Procedure Date  . Tonsillectomy   . Ear operations     Family History  Problem Relation Age of Onset  . Heart failure Mother   . Thyroid disease Mother   . Cancer Father     History  Substance Use Topics  . Smoking status: Current Everyday Smoker -- 1.0 packs/day for 30 years  . Smokeless tobacco: Not on file  . Alcohol Use: No      Review of Systems  Unable to perform ROS: Other     Allergies  Penicillins  Home Medications   Current Outpatient Rx  Name Route Sig Dispense Refill  . ALPRAZOLAM 0.5 MG PO TABS  Oral Take 0.5 mg by mouth 3 (three) times daily.      Marland Kitchen EZETIMIBE 10 MG PO TABS Oral Take 10 mg by mouth daily.      Marland Kitchen FLUOXETINE HCL 20 MG PO CAPS Oral Take 20 mg by mouth daily.      . FUROSEMIDE 20 MG PO TABS Oral Take 20 mg by mouth daily.      Marland Kitchen HYDROCODONE-ACETAMINOPHEN 7.5-325 MG PO TABS Oral Take 1 tablet by mouth every 6 (six) hours as needed. pain     . LACTULOSE 10 GM/15ML PO SOLN Oral Take 20 g by mouth 3 (three) times daily.      Marland Kitchen LEVETIRACETAM 500 MG PO TABS Oral Take 500 mg by mouth 2 (two) times daily.      Marland Kitchen METOPROLOL TARTRATE 50 MG PO TABS Oral Take 50 mg by mouth 3 (three) times daily.      Marland Kitchen OMEPRAZOLE 20 MG PO CPDR Oral Take 20 mg by mouth daily.      Marland Kitchen POTASSIUM CHLORIDE CRYS ER 20 MEQ PO TBCR Oral Take 20 mEq by mouth daily.      Marland Kitchen RIFAXIMIN 200 MG PO TABS Oral Take 550 mg by mouth 2 (two) times daily.        BP 142/72  Pulse 69  Temp(Src) 98 F (36.7 C) (Oral)  Resp 16  Ht 5' 7"  (1.702 m)  Wt 240 lb (108.863 kg)  BMI 37.59 kg/m2  SpO2 95%  Physical Exam  Nursing note and vitals reviewed. Constitutional: He is oriented to person, place, and time. He appears well-developed and well-nourished. No distress.  HENT:  Head: Normocephalic and atraumatic.  Mouth/Throat: Oropharynx is clear and moist.  Eyes: EOM are normal. Pupils are equal, round, and reactive to light.  Neck: Neck supple. No tracheal deviation present.  Cardiovascular: Normal rate and regular rhythm.  Exam reveals no gallop and no friction rub.   No murmur heard. Pulmonary/Chest: Effort normal. No respiratory distress. He has no wheezes. He has no rales.  Abdominal: Soft. Bowel sounds are normal. He exhibits no distension.  Musculoskeletal: Normal range of motion. He exhibits no edema.  Neurological: He is alert and oriented to person, place, and time. No sensory deficit.  Skin: Skin is warm and dry.  Psychiatric: He has a normal mood and affect. His behavior is normal.    ED Course    Procedures (including critical care time)  DIAGNOSTIC STUDIES: Oxygen Saturation is 95% on room air, adequate by my interpretation.    COORDINATION OF CARE:    Labs Reviewed  CBC - Abnormal; Notable for the following:    MCV 102.8 (*)    All other components within normal limits  COMPREHENSIVE METABOLIC PANEL - Abnormal; Notable for the following:    Glucose, Bld 145 (*)    Albumin 3.4 (*)    AST 62 (*)    Alkaline Phosphatase 141 (*)    Total Bilirubin 3.0 (*)    All other components within normal limits  DIFFERENTIAL  AMMONIA   Dg Abd Acute W/chest  08/24/2011  *RADIOLOGY REPORT*  Clinical Data: Right-sided abdominal pain.  Constipation.  ACUTE ABDOMEN SERIES (ABDOMEN 2 VIEW & CHEST 1 VIEW) 08/24/2011:  Comparison: CT abdomen and pelvis 02/02/2011 [facility].  Findings: Bowel gas pattern unremarkable without evidence of obstruction or significant ileus.  Expected amount of stool throughout normal caliber colon.  No evidence of free intraperitoneal air on the erect image.  Scattered air-fluid levels in the colon consistent with liquid stool.  No visible opaque urinary tract calculi.  Mild degenerative changes involving the lower lumbar spine.  Cardiac silhouette normal in size for the AP technique.  Lungs clear.  No visible pleural effusions.  IMPRESSION: No acute abdominal or pulmonary abnormalities.  Original Report Authenticated By: Deniece Portela, M.D.     No diagnosis found.    MDM  The patient presents complaining of bowels not acting right, feeling weak.  Has history of DM and recently fractured lower leg.  No chest pain or shortness of breath.  The workup today failed to reveal an acute cause for his symptoms.  There was no sign of bowel obstruction and the labs did not show a diabetic complication.  Will discharge to home with magnesium citrate.      I personally performed the services described in this documentation, which was scribed in my presence.  The recorded information has been reviewed and considered.      [name] Speak, MD 08/24/11 [phone]

## 2011-08-24 NOTE — ED Notes (Signed)
Pt states he fell on 08/01/11 and broke R tib/fib, denies other injuries.  Pt currently with fracture boot to RLE.  Pt c/o fall today denies new injury, with complaints of continued pain / weakness from prior fall.  Arrived to ED via EMS.  Report received.  VSS with transport Bp 110/83, HR 66, RR 16, CBG 149, SaO2 98 per EMS.

## 2011-08-24 NOTE — ED Notes (Signed)
Patient is resting comfortably. Family with pt

## 2011-08-24 NOTE — ED Notes (Signed)
Pt states: "I have not had a BM in 8 days.  I need to have a BM, can I have a bedpan?''  Pt with large loose-liquid BM/void count.  Skin cleansed.  Pt states": "I feel so much better, but my leg still hurts."

## 2011-08-25 ENCOUNTER — Inpatient Hospital Stay (HOSPITAL_COMMUNITY): Payer: Medicare Other

## 2011-08-25 ENCOUNTER — Inpatient Hospital Stay (HOSPITAL_COMMUNITY)
Admission: AD | Admit: 2011-08-25 | Discharge: 2011-08-31 | DRG: 493 | Disposition: A | Discharge status: Home Health | Payer: Medicare Other | Source: Ambulatory Visit | Attending: Orthopedic Surgery | Admitting: Orthopedic Surgery

## 2011-08-25 ENCOUNTER — Ambulatory Visit (INDEPENDENT_AMBULATORY_CARE_PROVIDER_SITE_OTHER): Payer: Medicare Other | Admitting: Orthopedic Surgery

## 2011-08-25 ENCOUNTER — Encounter (HOSPITAL_COMMUNITY): Payer: Self-pay | Admitting: Orthopedic Surgery

## 2011-08-25 ENCOUNTER — Encounter (HOSPITAL_COMMUNITY): Payer: Self-pay | Admitting: General Practice

## 2011-08-25 ENCOUNTER — Encounter: Payer: Self-pay | Admitting: Orthopedic Surgery

## 2011-08-25 DIAGNOSIS — S82843A Displaced bimalleolar fracture of unspecified lower leg, initial encounter for closed fracture: Secondary | ICD-10-CM | POA: Diagnosis present

## 2011-08-25 DIAGNOSIS — K219 Gastro-esophageal reflux disease without esophagitis: Secondary | ICD-10-CM

## 2011-08-25 DIAGNOSIS — D684 Acquired coagulation factor deficiency: Secondary | ICD-10-CM | POA: Diagnosis present

## 2011-08-25 DIAGNOSIS — F32A Depression, unspecified: Secondary | ICD-10-CM | POA: Diagnosis present

## 2011-08-25 DIAGNOSIS — J449 Chronic obstructive pulmonary disease, unspecified: Secondary | ICD-10-CM | POA: Diagnosis present

## 2011-08-25 DIAGNOSIS — J4489 Other specified chronic obstructive pulmonary disease: Secondary | ICD-10-CM | POA: Diagnosis present

## 2011-08-25 DIAGNOSIS — F17200 Nicotine dependence, unspecified, uncomplicated: Secondary | ICD-10-CM | POA: Diagnosis present

## 2011-08-25 DIAGNOSIS — F3289 Other specified depressive episodes: Secondary | ICD-10-CM | POA: Diagnosis present

## 2011-08-25 DIAGNOSIS — F329 Major depressive disorder, single episode, unspecified: Secondary | ICD-10-CM | POA: Diagnosis present

## 2011-08-25 DIAGNOSIS — I10 Essential (primary) hypertension: Secondary | ICD-10-CM | POA: Diagnosis present

## 2011-08-25 DIAGNOSIS — D689 Coagulation defect, unspecified: Secondary | ICD-10-CM | POA: Diagnosis present

## 2011-08-25 DIAGNOSIS — G40909 Epilepsy, unspecified, not intractable, without status epilepticus: Secondary | ICD-10-CM | POA: Diagnosis present

## 2011-08-25 DIAGNOSIS — Z720 Tobacco use: Secondary | ICD-10-CM

## 2011-08-25 DIAGNOSIS — K7460 Unspecified cirrhosis of liver: Secondary | ICD-10-CM | POA: Diagnosis present

## 2011-08-25 DIAGNOSIS — X58XXXA Exposure to other specified factors, initial encounter: Secondary | ICD-10-CM | POA: Diagnosis present

## 2011-08-25 DIAGNOSIS — E119 Type 2 diabetes mellitus without complications: Secondary | ICD-10-CM | POA: Diagnosis present

## 2011-08-25 LAB — COMPREHENSIVE METABOLIC PANEL
CO2: 28 mEq/L (ref 19–32)
Calcium: 9.7 mg/dL (ref 8.4–10.5)
Chloride: 102 mEq/L (ref 96–112)
Creatinine, Ser: 0.67 mg/dL (ref 0.50–1.35)
GFR calc Af Amer: >90 mL/min (ref >90)
GFR calc non Af Amer: >90 mL/min (ref >90)
Glucose, Bld: 185 mg/dL — ABNORMAL HIGH (ref 70–99)
Total Bilirubin: 2.3 mg/dL — ABNORMAL HIGH (ref 0.3–1.2)

## 2011-08-25 LAB — DIFFERENTIAL
Basophils Absolute: 0 10*3/uL (ref 0.0–0.1)
Basophils Relative: 1 % (ref 0–1)
Eosinophils Relative: 4 % (ref 0–5)
Lymphocytes Relative: 49 % — ABNORMAL HIGH (ref 12–46)
Monocytes Absolute: 0.5 10*3/uL (ref 0.1–1.0)
Neutro Abs: 1.6 10*3/uL — ABNORMAL LOW (ref 1.7–7.7)

## 2011-08-25 LAB — CBC
HCT: 39.1 % (ref 39.0–52.0)
Hemoglobin: 13.1 g/dL (ref 13.0–17.0)
MCH: 34.5 pg — ABNORMAL HIGH (ref 26.0–34.0)
MCV: 102.9 fL — ABNORMAL HIGH (ref 78.0–100.0)
RBC: 3.8 MIL/uL — ABNORMAL LOW (ref 4.22–5.81)
WBC: 4.7 10*3/uL (ref 4.0–10.5)

## 2011-08-25 LAB — PROTIME-INR: Prothrombin Time: 19 seconds — ABNORMAL HIGH (ref 11.6–15.2)

## 2011-08-25 LAB — GLUCOSE, CAPILLARY: Glucose-Capillary: 97 mg/dL (ref 70–99)

## 2011-08-25 MED ORDER — RIFAXIMIN 550 MG PO TABS
550.0000 mg | ORAL_TABLET | Freq: Two times a day (BID) | ORAL | Status: DC
  Administered 2011-08-25 – 2011-08-31 (×11): 550 mg via ORAL
  Filled 2011-08-25 (×15): qty 1

## 2011-08-25 MED ORDER — PHYTONADIONE 5 MG PO TABS
ORAL_TABLET | ORAL | Status: AC
  Filled 2011-08-25: qty 2

## 2011-08-25 MED ORDER — SODIUM CHLORIDE 0.9 % IV SOLN
INTRAVENOUS | Status: DC
  Administered 2011-08-25: 14:00:00 via INTRAVENOUS
  Administered 2011-08-26: 75 mL/h via INTRAVENOUS
  Administered 2011-08-28: 06:00:00 via INTRAVENOUS

## 2011-08-25 MED ORDER — FLUOXETINE HCL 20 MG PO CAPS
20.0000 mg | ORAL_CAPSULE | Freq: Every day | ORAL | Status: DC
  Administered 2011-08-25 – 2011-08-31 (×7): 20 mg via ORAL
  Filled 2011-08-25 (×7): qty 1

## 2011-08-25 MED ORDER — HYDROCODONE-ACETAMINOPHEN 5-325 MG PO TABS
1.0000 | ORAL_TABLET | ORAL | Status: DC | PRN
  Administered 2011-08-25 – 2011-08-30 (×12): 2 via ORAL
  Administered 2011-08-31: 1 via ORAL
  Filled 2011-08-25 (×11): qty 2
  Filled 2011-08-25: qty 1
  Filled 2011-08-25: qty 2

## 2011-08-25 MED ORDER — SODIUM CHLORIDE 0.9 % IJ SOLN
INTRAMUSCULAR | Status: AC
  Administered 2011-08-25: 3 mL
  Filled 2011-08-25: qty 3

## 2011-08-25 MED ORDER — SODIUM CHLORIDE 0.9 % IJ SOLN
INTRAMUSCULAR | Status: AC
  Administered 2011-08-25: 3 mL via INTRAVENOUS
  Filled 2011-08-25: qty 3

## 2011-08-25 MED ORDER — ALPRAZOLAM 0.5 MG PO TABS
0.5000 mg | ORAL_TABLET | Freq: Three times a day (TID) | ORAL | Status: DC
  Administered 2011-08-25 – 2011-08-31 (×17): 0.5 mg via ORAL
  Filled 2011-08-25 (×18): qty 1

## 2011-08-25 MED ORDER — CHLORHEXIDINE GLUCONATE 4 % EX LIQD
60.0000 mL | Freq: Once | CUTANEOUS | Status: DC
  Filled 2011-08-25: qty 60

## 2011-08-25 MED ORDER — EZETIMIBE 10 MG PO TABS
10.0000 mg | ORAL_TABLET | Freq: Every day | ORAL | Status: DC
  Administered 2011-08-25 – 2011-08-31 (×7): 10 mg via ORAL
  Filled 2011-08-25 (×7): qty 1

## 2011-08-25 MED ORDER — HYDROMORPHONE HCL PF 1 MG/ML IJ SOLN
0.5000 mg | INTRAMUSCULAR | Status: DC | PRN
  Administered 2011-08-25 – 2011-08-29 (×8): 0.5 mg via INTRAVENOUS
  Filled 2011-08-25 (×8): qty 1

## 2011-08-25 MED ORDER — LACTULOSE 10 GM/15ML PO SOLN
20.0000 g | Freq: Three times a day (TID) | ORAL | Status: DC
  Administered 2011-08-25 – 2011-08-31 (×14): 20 g via ORAL
  Filled 2011-08-25 (×15): qty 30

## 2011-08-25 MED ORDER — PHYTONADIONE 5 MG PO TABS
10.0000 mg | ORAL_TABLET | Freq: Once | ORAL | Status: AC
  Administered 2011-08-25: 10 mg via ORAL
  Filled 2011-08-25: qty 2

## 2011-08-25 MED ORDER — GUAIFENESIN ER 600 MG PO TB12
600.0000 mg | ORAL_TABLET | Freq: Two times a day (BID) | ORAL | Status: DC
  Administered 2011-08-25 – 2011-08-31 (×10): 600 mg via ORAL
  Filled 2011-08-25 (×10): qty 1

## 2011-08-25 MED ORDER — INSULIN ASPART 100 UNIT/ML ~~LOC~~ SOLN: 0.0000 [IU] | Freq: Every day | SUBCUTANEOUS | Status: DC

## 2011-08-25 MED ORDER — METOPROLOL TARTRATE 50 MG PO TABS
50.0000 mg | ORAL_TABLET | Freq: Three times a day (TID) | ORAL | Status: DC
  Administered 2011-08-25 – 2011-08-31 (×17): 50 mg via ORAL
  Filled 2011-08-25 (×19): qty 1

## 2011-08-25 MED ORDER — SODIUM CHLORIDE 0.9 % IJ SOLN
INTRAMUSCULAR | Status: AC
  Administered 2011-08-25: 10 mL
  Filled 2011-08-25: qty 3

## 2011-08-25 MED ORDER — PANTOPRAZOLE SODIUM 40 MG PO TBEC
40.0000 mg | DELAYED_RELEASE_TABLET | Freq: Every day | ORAL | Status: DC
  Administered 2011-08-25 – 2011-08-31 (×7): 40 mg via ORAL
  Filled 2011-08-25 (×7): qty 1

## 2011-08-25 MED ORDER — LEVETIRACETAM 500 MG PO TABS
500.0000 mg | ORAL_TABLET | Freq: Two times a day (BID) | ORAL | Status: DC
  Administered 2011-08-25 – 2011-08-31 (×12): 500 mg via ORAL
  Filled 2011-08-25 (×12): qty 1

## 2011-08-25 MED ORDER — INSULIN ASPART 100 UNIT/ML ~~LOC~~ SOLN
0.0000 [IU] | Freq: Three times a day (TID) | SUBCUTANEOUS | Status: DC
  Filled 2011-08-25: qty 3

## 2011-08-25 MED ORDER — ALBUTEROL SULFATE (5 MG/ML) 0.5% IN NEBU: 2.5000 mg | INHALATION_SOLUTION | Freq: Four times a day (QID) | RESPIRATORY_TRACT | Status: DC | PRN

## 2011-08-25 MED ORDER — VANCOMYCIN HCL 1000 MG IV SOLR
1500.0000 mg | INTRAVENOUS | Status: DC
  Filled 2011-08-25: qty 1500

## 2011-08-25 NOTE — Progress Notes (Signed)
Pneumonia vaccine 07/2011; Flu vaccine 06/2011

## 2011-08-25 NOTE — Consult Note (Signed)
Requesting physician: [name], MD  Reason for consultation: Medical Management   History of Present Illness: This is a 51 year old gentleman with history of COPD, diabetes, hypertension, chronic liver disease with cirrhosis. Patient was admitted to the hospital from Dr. Ruthe Mannan office after being seen for a right ankle fracture. His fracture has been managed nonoperatively to this point. It was felt by the orthopedic service that patient may require operative management of his fracture and therefore the patient was admitted. Hospitalist service has been asked to see the patient to help manage his medical problems and medical clearance.  At this time patient is complaining of pain in his right foot. He reports chronic pain since his fracture. He has a difficult time ambulating with this pain. Denies any shortness of breath, chest pain, nausea, vomiting, diarrhea, dysuria, wheezing. He says he has an occasional cough, but this is not out of the ordinary for him. He reports he does not drink any alcohol and never has in the past. He appears to be near his baseline level of functioning.  Allergies:   Allergies  Allergen Reactions  . Penicillins       Past Medical History  Diagnosis Date  . Depression   . Cirrhosis   . Bipolar 1 disorder   . Hypertension   . Diabetes mellitus   . Seizures   . COPD (chronic obstructive pulmonary disease)   . Mental retardation   . Blind right eye   . GERD (gastroesophageal reflux disease)   . Chronic back pain   . Hepatic encephalopathy     Past Surgical History  Procedure Date  . Tonsillectomy   . Ear operations     Scheduled Meds:   . sodium chloride      . sodium chloride       Continuous Infusions:   . sodium chloride 75 mL/hr at 08/25/11 1427   PRN Meds:.HYDROcodone-acetaminophen, HYDROmorphone  Social History:  reports that he has been smoking.  He does not have any smokeless tobacco history on file. He reports that he  does not drink alcohol or use illicit drugs.  Family History  Problem Relation Age of Onset  . Heart failure Mother   . Thyroid disease Mother   . Cancer Father     Review of Systems:  Constitutional: Denies fever, chills, diaphoresis, appetite change and fatigue.  HEENT: Denies photophobia, eye pain, redness, hearing loss, ear pain, congestion, sore throat, rhinorrhea, sneezing, mouth sores, trouble swallowing, neck pain, neck stiffness and tinnitus.   Respiratory: Denies SOB, DOE, cough, chest tightness,  and wheezing.   Cardiovascular: Denies chest pain, palpitations and leg swelling.  Gastrointestinal: Denies nausea, vomiting, abdominal pain, diarrhea, constipation, blood in stool and abdominal distention.  Genitourinary: Denies dysuria, urgency, frequency, hematuria, flank pain and difficulty urinating.  Musculoskeletal: Denies myalgias, back pain, joint swelling, arthralgias and gait problem.  Skin: Denies pallor, rash and wound.  Neurological: Denies dizziness, seizures, syncope, weakness, light-headedness, numbness and headaches.  Hematological: Denies adenopathy. Easy bruising, personal or family bleeding history  Psychiatric/Behavioral: Denies suicidal ideation, mood changes, confusion, nervousness, sleep disturbance and agitation   Physical Exam: Blood pressure 112/63, pulse 81, temperature 98.1 F (36.7 C), temperature source Oral, resp. rate 51, height 5' 6"  (1.676 m), weight 100.9 kg (222 lb 7.1 oz), SpO2 94.00%. General: Patient is lying in bed, sleeping, easily arouses to voice, in no acute distress, alert and oriented x3 HEENT: Normocephalic, atraumatic, mucous membranes are dry Neck: Supple Chest: Minimal expiratory wheeze bilaterally,  mostly clear Cardiac: S1, S2, regular rate and rhythm Abdomen: Soft, nontender, nondistended, bowel sounds are active Extremities: Left lower extremity shows no edema cyanosis or clubbing, right lower extremity is in brace Neurologic:  Grossly intact, nonfocal  Labs on Admission:  Results for orders placed during the hospital encounter of 08/25/11 (from the past 48 hour(s))  APTT     Status: Abnormal   Collection Time   08/25/11 12:35 PM      Component Value Range Comment   aPTT 39 (*) 24 - 37 (seconds)   CBC     Status: Abnormal   Collection Time   08/25/11 12:35 PM      Component Value Range Comment   WBC 4.7  4.0 - 10.5 (K/uL)    RBC 3.80 (*) 4.22 - 5.81 (MIL/uL)    Hemoglobin 13.1  13.0 - 17.0 (g/dL)    HCT 39.1  39.0 - 52.0 (%)    MCV 102.9 (*) 78.0 - 100.0 (fL)    MCH 34.5 (*) 26.0 - 34.0 (pg)    MCHC 33.5  30.0 - 36.0 (g/dL)    RDW 14.6  11.5 - 15.5 (%)    Platelets 111 (*) 150 - 400 (K/uL) DELTA CHECK NOTED  COMPREHENSIVE METABOLIC PANEL     Status: Abnormal   Collection Time   08/25/11 12:35 PM      Component Value Range Comment   Sodium 139  135 - 145 (mEq/L)    Potassium 3.6  3.5 - 5.1 (mEq/L) DELTA CHECK NOTED   Chloride 102  96 - 112 (mEq/L)    CO2 28  19 - 32 (mEq/L)    Glucose, Bld 185 (*) 70 - 99 (mg/dL)    BUN 9  6 - 23 (mg/dL)    Creatinine, Ser 0.67  0.50 - 1.35 (mg/dL)    Calcium 9.7  8.4 - 10.5 (mg/dL)    Total Protein 7.1  6.0 - 8.3 (g/dL)    Albumin 2.9 (*) 3.5 - 5.2 (g/dL)    AST 44 (*) 0 - 37 (U/L)    ALT 16  0 - 53 (U/L)    Alkaline Phosphatase 117  39 - 117 (U/L)    Total Bilirubin 2.3 (*) 0.3 - 1.2 (mg/dL)    GFR calc non Af Amer >90  >90 (mL/min)    GFR calc Af Amer >90  >90 (mL/min)   DIFFERENTIAL     Status: Abnormal   Collection Time   08/25/11 12:35 PM      Component Value Range Comment   Neutrophils Relative 35 (*) 43 - 77 (%)    Neutro Abs 1.6 (*) 1.7 - 7.7 (K/uL)    Lymphocytes Relative 49 (*) 12 - 46 (%)    Lymphs Abs 2.3  0.7 - 4.0 (K/uL)    Monocytes Relative 11  3 - 12 (%)    Monocytes Absolute 0.5  0.1 - 1.0 (K/uL)    Eosinophils Relative 4  0 - 5 (%)    Eosinophils Absolute 0.2  0.0 - 0.7 (K/uL)    Basophils Relative 1  0 - 1 (%)    Basophils Absolute 0.0  0.0  - 0.1 (K/uL)   PROTIME-INR     Status: Abnormal   Collection Time   08/25/11 12:35 PM      Component Value Range Comment   Prothrombin Time 19.0 (*) 11.6 - 15.2 (seconds)    INR 1.56 (*) 0.00 - 1.49      Radiological Exams on  Admission: Dg Abd Acute W/chest  08/24/2011  *RADIOLOGY REPORT*  Clinical Data: Right-sided abdominal pain.  Constipation.  ACUTE ABDOMEN SERIES (ABDOMEN 2 VIEW & CHEST 1 VIEW) 08/24/2011:  Comparison: CT abdomen and pelvis 02/02/2011 [facility].  Findings: Bowel gas pattern unremarkable without evidence of obstruction or significant ileus.  Expected amount of stool throughout normal caliber colon.  No evidence of free intraperitoneal air on the erect image.  Scattered air-fluid levels in the colon consistent with liquid stool.  No visible opaque urinary tract calculi.  Mild degenerative changes involving the lower lumbar spine.  Cardiac silhouette normal in size for the AP technique.  Lungs clear.  No visible pleural effusions.  IMPRESSION: No acute abdominal or pulmonary abnormalities.  Original Report Authenticated By: Deniece Portela, M.D.    Assessment/Plan #1. Ankle fracture. Management per the orthopedic service #2 COPD. Patient was counseled on on the portions of tobacco cessation. We will start him on when necessary nebulization treatments for wheezing. We will also start him on Mucinex as well as an incentive spirometer. Patient should be encouraged to use this pre-and postop to avoid any pulmonary complications. #3 diabetes. Review of the patient's home medications does not reveal a diabetic regimen. For now we will start him on sliding scale, and check an hemoglobin A1c to see how his glycemic control has been. We will start him on a diabetic diet. #4. Hypertension. We'll continue his outpatient regimen and follow his blood pressures #5 depression. Continue his outpatient regimen  #6. Chronic liver disease with cirrhosis. We'll continue his lactulose and  Xifaxan especially since he has a history of hepatic encephalopathy. His mentation appears appropriate at this time. #7 coagulopathy. This is likely secondary to the patient's liver disease. We'll give a dose of vitamin K right now and repeat INR in the morning. #8. Seizure disorder. Continue outpatient dose of Keppra #9. Preoperative risk. The patient does not report that he has any history of cardiac disease or cerebrovascular. Type of surgery he would be undergoing would not be a high-risk surgery. He has a normal serum creatinine and is not on any insulin for his diabetes chronically. Per Lee's revised cardiac risk index, patient would be considered class I, and would be classified as very low risk. Cardiology consultation has also been ordered. From medicine standpoint patient may proceed with surgery when felt appropriate by orthopedics.  Time Spent on Consultation: 80mns  MEMON,JEHANZEB [facility]  08/25/2011, 5:34 PM

## 2011-08-25 NOTE — Progress Notes (Signed)
Patient ID: [name] [name], male   DOB: 1961/07/24, 51 y.o.   MRN: [id number] Repeat x-ray in this noncompliant patient who has liver disease and heart failure shows that his fracture is subluxating.  Recommend admission to the hospital medical consult and then surgery on the ankle if we can get anesthesia to anesthetize him  If not then we'll have to refer him to the medical service at [facility] and get an orthopaedic consult there  Direct admission  Separate identifiable x-ray RIGHT ankle  Previous x-rays are noted the fracture is now subluxated and displaced  Impression displaced subluxated bimalleolar, possible trimalleolar fracture

## 2011-08-25 NOTE — H&P (Signed)
  Chief complaint broken leg.   51 year old male with multiple medical problems, diabetes, peripheral neuropathy,  and mentally challenged; non compliant with weight bearing status presents with displacement of a previously non-dispalced bimalleolar ankle fracture, . Date of injury December 9. Initial treatment with cam walker and wheelchair, with advice to not weight bear.   Medication hydrocodone 7.5 mg. Symptoms, sharp throbbing, burning, pain, area pain as a 10 out of 10. Pain is constant. Worse with standing better with elevation. Complains of some numbness and tingling.  Multiple review of system complaints. Nervousness anxiety, depression, easy bleeding, excessive thirst. Seizure. Joint pain, instability, stiffness, and muscle pain. Frequency. Heartburn. Wheezing and snoring. Chest pain. I pain.  Medical problems as noted above, and reviewed.   Exam  Physical Exam(12)  GENERAL: normal development  CDV: pulses are normal  Skin: normal  Lymph: nodes were not palpable/normal  Psychiatric: awake, alert and oriented  Neuro: normal sensation  MSK He is nonambulatory. He is ambulating with a wheelchair.  1The ankle is swollen and tender in the medial malleolus and lateral malleolus. The foot has a chronic pes planus, he has deformity of the ankle  2 Has really no active range of motion at this time because of pain  3 Stability is not assessed because of pain  4 Muscle tone is normal   Assessment: new films today show displacement of the ankle mortise, and displacement of the fracture  Plan  Admit, keep non weight bearing, obtain medical consult, if surgery can be done here we will fix the fracture if not he will have to be transferred to a medical service and ortho consult obtained

## 2011-08-26 ENCOUNTER — Encounter (HOSPITAL_COMMUNITY): Payer: Self-pay | Admitting: Adult Health

## 2011-08-26 DIAGNOSIS — I10 Essential (primary) hypertension: Secondary | ICD-10-CM

## 2011-08-26 DIAGNOSIS — Z01810 Encounter for preprocedural cardiovascular examination: Secondary | ICD-10-CM

## 2011-08-26 LAB — GLUCOSE, CAPILLARY
Glucose-Capillary: 85 mg/dL (ref 70–99)
Glucose-Capillary: 97 mg/dL (ref 70–99)
Glucose-Capillary: 104 mg/dL — ABNORMAL HIGH (ref 70–99)

## 2011-08-26 LAB — HEMOGLOBIN A1C
Hgb A1c MFr Bld: 5.2 % (ref <5.7)
Mean Plasma Glucose: 103 mg/dL (ref <117)

## 2011-08-26 MED ORDER — NICOTINE 21 MG/24HR TD PT24
21.0000 mg | MEDICATED_PATCH | Freq: Every day | TRANSDERMAL | Status: DC
  Administered 2011-08-26 – 2011-08-31 (×5): 21 mg via TRANSDERMAL
  Filled 2011-08-26 (×6): qty 1

## 2011-08-26 NOTE — Progress Notes (Signed)
Subjective: Pain appears to be reasonably controlled, denies any complaints  Objective: Vital signs in last 24 hours: Temp:  [98.1 F (36.7 C)] 98.1 F (36.7 C) (01/03 0600) Pulse Rate:  [57-81] 62  (01/03 0600) Resp:  [18-51] 20  (01/03 0600) BP: (112-145)/(63-77) 145/76 mmHg (01/03 0600) SpO2:  [92 %-95 %] 95 % (01/03 0600) Weight change:  Last BM Date: 08/24/11  Intake/Output from previous day: 01/02 0701 - 01/03 0700 In: 886.8 [P.O.:720; I.V.:166.8] Out: 930 [Urine:930]     Physical Exam: General: Alert, awake, oriented x3, in no acute distress. HEENT: No bruits, no goiter. Heart: Regular rate and rhythm, without murmurs, rubs, gallops. Lungs: Clear to auscultation bilaterally. Abdomen: Soft, nontender, nondistended, positive bowel sounds. Extremities: No clubbing cyanosis or edema with positive pedal pulses. Neuro: Grossly intact, nonfocal.    Lab Results: Basic Metabolic Panel:  Basename 08/25/11 1235 08/24/11 1209  NA 139 141  K 3.6 4.5  CL 102 105  CO2 28 28  GLUCOSE 185* 145*  BUN 9 11  CREATININE 0.67 0.85  CALCIUM 9.7 10.3  MG -- --  PHOS -- --   Liver Function Tests:  Basename 08/25/11 1235 08/24/11 1209  AST 44* 62*  ALT 16 21  ALKPHOS 117 141*  BILITOT 2.3* 3.0*  PROT 7.1 8.3  ALBUMIN 2.9* 3.4*   No results found for this basename: LIPASE:2,AMYLASE:2 in the last 72 hours  Basename 08/24/11 1256  AMMONIA 52   CBC:  Basename 08/25/11 1235 08/24/11 1209  WBC 4.7 5.6  NEUTROABS 1.6* 3.8  HGB 13.1 14.3  HCT 39.1 43.8  MCV 102.9* 102.8*  PLT 111* 159   Cardiac Enzymes: No results found for this basename: CKTOTAL:3,CKMB:3,CKMBINDEX:3,TROPONINI:3 in the last 72 hours BNP: No results found for this basename: PROBNP:3 in the last 72 hours D-Dimer: No results found for this basename: DDIMER:2 in the last 72 hours CBG:  Basename 08/26/11 1114 08/26/11 0736 08/25/11 2318  GLUCAP 97 85 97   Hemoglobin A1C:  Basename 08/25/11 1235    HGBA1C 5.2   Fasting Lipid Panel: No results found for this basename: CHOL,HDL,LDLCALC,TRIG,CHOLHDL,LDLDIRECT in the last 72 hours Thyroid Function Tests: No results found for this basename: TSH,T4TOTAL,FREET4,T3FREE,THYROIDAB in the last 72 hours Anemia Panel: No results found for this basename: VITAMINB12,FOLATE,FERRITIN,TIBC,IRON,RETICCTPCT in the last 72 hours Coagulation:  Basename 08/26/11 0446 08/25/11 1235  LABPROT 18.2* 19.0*  INR 1.48 1.56*   Urine Drug Screen: Drugs of Abuse     Component Value Date/Time   LABOPIA NONE DETECTED 03/03/2011 1738   COCAINSCRNUR NONE DETECTED 03/03/2011 1738   LABBENZ POSITIVE* 03/03/2011 1738   AMPHETMU NONE DETECTED 03/03/2011 1738   THCU NONE DETECTED 03/03/2011 1738   LABBARB NONE DETECTED 03/03/2011 1738    Alcohol Level: No results found for this basename: ETH:2 in the last 72 hours  No results found for this or any previous visit (from the past 240 hour(s)).  Studies/Results: No results found.  Medications: Scheduled Meds:   . ALPRAZolam  0.5 mg Oral TID  . chlorhexidine  60 mL Topical Once  . ezetimibe  10 mg Oral Daily  . FLUoxetine  20 mg Oral Daily  . guaiFENesin  600 mg Oral BID  . insulin aspart  0-15 Units Subcutaneous TID WC  . insulin aspart  0-5 Units Subcutaneous QHS  . lactulose  20 g Oral TID  . levETIRAcetam  500 mg Oral BID  . metoprolol  50 mg Oral TID  . pantoprazole  40 mg Oral  Q1200  . phytonadione  10 mg Oral Once  . rifaximin  550 mg Oral BID  . sodium chloride      . sodium chloride      . sodium chloride      . vancomycin  1,500 mg Intravenous 120 min pre-op   Continuous Infusions:   . sodium chloride 75 mL/hr at 08/25/11 1821   PRN Meds:.albuterol, HYDROcodone-acetaminophen, HYDROmorphone  Assessment/Plan:  Principal Problem:  *Ankle fracture, bimalleolar, closed Active Problems:  COPD (chronic obstructive pulmonary disease)  DM (diabetes mellitus)  HTN (hypertension)   Coagulopathy  Cirrhosis  Depression  Seizure disorder  GERD (gastroesophageal reflux disease)  Tobacco abuse  Plan:  Surgical repair of ankle fracture per orthopedics Patient's blood sugars are relatively in normal range.  He may not have diabetes, and may just be a glucose intolerance.  Will likely need to be discharged on just diet modifications. Continue Beta blocker Mental status intact, continue lactulose   LOS: 1 day   MEMON,JEHANZEB [facility] 08/26/2011, 1:32 PM

## 2011-08-26 NOTE — Consult Note (Signed)
CARDIOLOGY CONSULT NOTE  Patient ID: SCOTT VANDERVEER MRN: [id number] DOB/AGE: [birth year] 51 y.o.  Admit date: 08/25/2011 Referring Physician: Gabriel Cirri, NP, NP Primary Cardiologist:  Riley Kill Reason for Consultation: Pre-Operative Cardiac Evaluation Principal Problem:  *Ankle fracture, bimalleolar, closed Active Problems:  COPD (chronic obstructive pulmonary disease)  DM (diabetes mellitus)  HTN (hypertension)  Coagulopathy  Cirrhosis  Depression  Seizure disorder  GERD (gastroesophageal reflux disease)  Tobacco abuse  HPI: Mr. Alcocer is a 51 year old obese male former patient of Dr. Riley Kill in the [facility] office. He has not seen Dr. Riley Kill for greater than 5 years. He has multiple medical problems to include diabetes,peripheral neuropathy, liver cirrhosis with subsequent thrombocytopenia, gait disturbances, seizure disorder,mental retardation, hypertension, cerebrovascular disease ( MRA of the brain and neck in ecember 2011.  suggestion of left vertebral artery stenosis on the MRA; however, the left vertebral artery was patent on the carotid ultrasound study), and hypertension, anxiety. He was seen by Dr. Riley Kill secondary to tachycardia, apparently sinus.  Negative stress nuclear study in 2006.  Most recent echo in 2011 revealed normal LV function with no wall motion abnormality. He states he was told by Dr. Arminda Resides that the tachycardia was anxiety induced and was placed on anti-anxiety medications. He states that they have been helpful and he has not had any recurrence of the tachycardia, unless he becomes very anxious and has not taken the medicine      He presented to the emergency room 08/01/2011, after falling on some wet grass and twisting his left ankle resulting in oblique fracture through the distal diaphysis ofthe fibula. There is also a fracture of the posterior malleolus.he was treated medically per Dr. Romeo Apple with ankle brace and use of walker or  wheelchair and not to bear weight. However the patient's pain in his ankle became worse with excessive throbbing and numbness and tingling. He is now therefore planned for surgery in the a.m. for repair. We are asked for cardiac evaluation prior to operation.        Review of systems complete and found to be negative unless listed above   Past Medical History  Diagnosis Date  . Depression   . Cirrhosis   . Bipolar 1 disorder   . Hypertension   . Diabetes mellitus   . Seizures   . COPD (chronic obstructive pulmonary disease)   . Mental retardation   . Blind right eye   . GERD (gastroesophageal reflux disease)   . Chronic back pain   . Hepatic encephalopathy     Family History  Problem Relation Age of Onset  . Heart failure Mother   . Thyroid disease Mother   . Cancer Father     History   Social History  . Marital Status: Single    Spouse Name: N/A    Number of Children: N/A  . Years of Education: N/A   Occupational History  . Not on file.   Social History Main Topics  . Smoking status: Current Everyday Smoker -- 2.0 packs/day for 30 years  . Smokeless tobacco: Not on file  . Alcohol Use: No  . Drug Use: No  . Sexually Active:    Other Topics Concern  . Not on file   Social History Narrative  . No narrative on file    Past Surgical History  Procedure Date  . Tonsillectomy   . Ear operations      Prescriptions prior to admission  Medication Sig Dispense Refill  . ALPRAZolam Prudy Feeler)  0.5 MG tablet Take 0.5 mg by mouth 3 (three) times daily.        Marland Kitchen ezetimibe (ZETIA) 10 MG tablet Take 10 mg by mouth daily.        Marland Kitchen FLUoxetine (PROZAC) 20 MG capsule Take 20 mg by mouth daily.        . furosemide (LASIX) 20 MG tablet Take 20 mg by mouth daily.        Marland Kitchen HYDROcodone-acetaminophen (NORCO) 7.5-325 MG per tablet Take 1 tablet by mouth every 6 (six) hours as needed. pain       . lactulose (CHRONULAC) 10 GM/15ML solution Take 20 g by mouth 3 (three) times daily.          Marland Kitchen levETIRAcetam (KEPPRA) 500 MG tablet Take 500 mg by mouth 2 (two) times daily.        . metoprolol (LOPRESSOR) 50 MG tablet Take 50 mg by mouth 3 (three) times daily.        Marland Kitchen omeprazole (PRILOSEC) 20 MG capsule Take 20 mg by mouth daily.        . potassium chloride SA (K-DUR,KLOR-CON) 20 MEQ tablet Take 20 mEq by mouth daily.        . rifaximin (XIFAXAN) 200 MG tablet Take 550 mg by mouth 2 (two) times daily.          Physical Exam: Blood pressure 145/76, pulse 62, temperature 98.1 F (36.7 C), temperature source Oral, resp. rate 20, height 5\' 6"  (1.676 m), weight 222 lb 7.1 oz (100.9 kg), SpO2 95.00%.   General: Well developed, well nourished, in no acute distress Head: Eyes Right eye cloudy without focus or tracking, left eye normal Wears glasses.  No xanthomas.   Normal cephalic and atramatic  Lungs: Clear bilaterally to auscultation and percussion. Heart: HRRR S1 S2, without MRG.  Pulses are 2+ & equal.            No carotid bruit. No JVD.  No abdominal bruits. No femoral bruits. Abdomen: Bowel sounds are positive, abdomen soft and non-tender without masses or                  Hernia's noted. Msk:  Back normal Normal strength and tone for age.Ankle brace to his left ankle. Extremities: No clubbing, cyanosis or edema.Left  Neuro: Alert and oriented X 3. Psych:  Good affect, responds appropriately   Labs:   Lab Results  Component Value Date   WBC 4.7 08/25/2011   HGB 13.1 08/25/2011   HCT 39.1 08/25/2011   MCV 102.9* 08/25/2011   PLT 111* 08/25/2011    Lab 08/25/11 1235  NA 139  K 3.6  CL 102  CO2 28  BUN 9  CREATININE 0.67  CALCIUM 9.7  PROT 7.1  BILITOT 2.3*  ALKPHOS 117  ALT 16  AST 44*  GLUCOSE 185*   Lab Results  Component Value Date   CKTOTAL 807* 01/28/2011   CKMB 4.3* 01/28/2011   TROPONINI  Value: <0.30        Due to the release kinetics of cTnI, a negative result within the first hours of the onset of symptoms does not rule out myocardial infarction with  certainty. If myocardial infarction is still suspected, repeat the test at appropriate intervals. 01/28/2011    Lab Results  Component Value Date   CHOL  Value: 131        ATP III CLASSIFICATION:  <200     mg/dL   Desirable  161-096  mg/dL  Borderline High  >=240    mg/dL   High        52/84/1324   Lab Results  Component Value Date   HDL 26* 08/11/2010   Lab Results  Component Value Date   LDLCALC  Value: 89        Total Cholesterol/HDL:CHD Risk Coronary Heart Disease Risk Table                     Men   Women  1/2 Average Risk   3.4   3.3  Average Risk       5.0   4.4  2 X Average Risk   9.6   7.1  3 X Average Risk  23.4   11.0        Use the calculated Patient Ratio above and the CHD Risk Table to determine the patient's CHD Risk.        ATP III CLASSIFICATION (LDL):  <100     mg/dL   Optimal  401-027  mg/dL   Near or Above                    Optimal  130-159  mg/dL   Borderline  253-664  mg/dL   High  >403     mg/dL   Very High 47/42/5956   Lab Results  Component Value Date   TRIG 78 08/11/2010   Lab Results  Component Value Date   CHOLHDL 5.0 08/11/2010   No results found for this basename: LDLDIRECT   ECHO 08/12/2010 Left ventricle: The cavity size was normal. Wall thickness was increased in a pattern of mild to moderate LVH. Systolic function was normal. The estimated ejection fraction was in the range of 50% to 55%. Wall motion was normal; there were no regional wall motion abnormalities.  Radiology: Dg Abd Acute W/chest  08/24/2011  *RADIOLOGY REPORT*  Clinical Data: Right-sided abdominal pain.  Constipation.  ACUTE ABDOMEN SERIES (ABDOMEN 2 VIEW & CHEST 1 VIEW) 08/24/2011:  Comparison: CT abdomen and pelvis 02/02/2011 [facility].  Findings: Bowel gas pattern unremarkable without evidence of obstruction or significant ileus.  Expected amount of stool throughout normal caliber colon.  No evidence of free intraperitoneal air on the erect image.  Scattered air-fluid levels  in the colon consistent with liquid stool.  No visible opaque urinary tract calculi.  Mild degenerative changes involving the lower lumbar spine.  Cardiac silhouette normal in size for the AP technique.  Lungs clear.  No visible pleural effusions.  IMPRESSION: No acute abdominal or pulmonary abnormalities.  Original Report Authenticated By: Arnell Sieving, M.D.   EKG:  ASSESSMENT AND PLAN:   1. Left Ankle Fx: Although he has multiple cardiovascular risk factors to include diabetes hypertension hypercholesterolemia and family history his most recent stress test in 2006 was found to be negative his most recent echo one year ago revealed no wall motion abnormalities. He's been asymptomatic from a cardiac standpoint without recurrent complaints of chest pain although he does occasionally have some tachycardia. This patient is a low risk surgical candidate for cardiac event. He may have some postoperative tachycardia in the setting of catecholamine surge and pain this will be monitored. He is currently on metoprolol 50 mg 3 times a day. Would continue this. No further cardiac testing is warranted at this time. He will however need to be followed in our office for continued risk management for cardiovascular disease.  Bettey Mare. Lyman Bishop NP [facility] 08/26/2011, 10:20 AM   Cardiology Attending Patient interviewed and examined. Discussed with Joni Reining, NP.  Above note annotated and modified based upon my findings.   Patient has no known cardiovascular disease and no serious cardiac issues. No further testing or treatment is required.  Surgical risk is elevated due to multiple noncardiac medical issues, but if ORIF is required, this would not be a high risk procedure. We will be happy to follow as needed while patient is in hospital.   [place] Bing, MD 08/26/2011, 7:14 PM

## 2011-08-27 ENCOUNTER — Encounter (HOSPITAL_COMMUNITY): Payer: Self-pay | Admitting: *Deleted

## 2011-08-27 ENCOUNTER — Encounter (HOSPITAL_COMMUNITY)
Admission: AD | Disposition: A | Discharge status: Home Health | Payer: Self-pay | Source: Ambulatory Visit | Attending: Orthopedic Surgery

## 2011-08-27 ENCOUNTER — Encounter (HOSPITAL_COMMUNITY): Payer: Self-pay | Admitting: Anesthesiology

## 2011-08-27 ENCOUNTER — Inpatient Hospital Stay (HOSPITAL_COMMUNITY): Payer: Medicare Other

## 2011-08-27 ENCOUNTER — Inpatient Hospital Stay (HOSPITAL_COMMUNITY): Payer: Medicare Other | Admitting: Anesthesiology

## 2011-08-27 DIAGNOSIS — S82843A Displaced bimalleolar fracture of unspecified lower leg, initial encounter for closed fracture: Principal | ICD-10-CM

## 2011-08-27 HISTORY — PX: ORIF ANKLE FRACTURE: SHX5408

## 2011-08-27 LAB — GLUCOSE, CAPILLARY
Glucose-Capillary: 88 mg/dL (ref 70–99)
Glucose-Capillary: 80 mg/dL (ref 70–99)
Glucose-Capillary: 85 mg/dL (ref 70–99)

## 2011-08-27 SURGERY — OPEN REDUCTION INTERNAL FIXATION (ORIF) ANKLE FRACTURE
Anesthesia: Spinal | Site: Ankle | Laterality: Right | Wound class: Clean

## 2011-08-27 MED ORDER — SODIUM CHLORIDE 0.9 % IV SOLN
1500.0000 mg | INTRAVENOUS | Status: AC
  Administered 2011-08-27: 1500 mg via INTRAVENOUS
  Filled 2011-08-27: qty 1500

## 2011-08-27 MED ORDER — MIDAZOLAM HCL 2 MG/2ML IJ SOLN
INTRAMUSCULAR | Status: AC
  Filled 2011-08-27: qty 2

## 2011-08-27 MED ORDER — CHLORHEXIDINE GLUCONATE 4 % EX LIQD
60.0000 mL | Freq: Once | CUTANEOUS | Status: DC
  Filled 2011-08-27: qty 60

## 2011-08-27 MED ORDER — ONDANSETRON HCL 4 MG PO TABS: 4.0000 mg | ORAL_TABLET | Freq: Four times a day (QID) | ORAL | Status: DC | PRN

## 2011-08-27 MED ORDER — ONDANSETRON HCL 4 MG/2ML IJ SOLN: 4.0000 mg | Freq: Four times a day (QID) | INTRAMUSCULAR | Status: DC | PRN

## 2011-08-27 MED ORDER — ENOXAPARIN SODIUM 30 MG/0.3ML ~~LOC~~ SOLN
30.0000 mg | Freq: Two times a day (BID) | SUBCUTANEOUS | Status: DC
  Administered 2011-08-28 – 2011-08-31 (×8): 30 mg via SUBCUTANEOUS
  Filled 2011-08-27 (×8): qty 0.3

## 2011-08-27 MED ORDER — LIDOCAINE HCL (PF) 1 % IJ SOLN
INTRAMUSCULAR | Status: AC
  Filled 2011-08-27: qty 5

## 2011-08-27 MED ORDER — SENNOSIDES-DOCUSATE SODIUM 8.6-50 MG PO TABS: 1.0000 | ORAL_TABLET | Freq: Every evening | ORAL | Status: DC | PRN

## 2011-08-27 MED ORDER — MIDAZOLAM HCL 5 MG/5ML IJ SOLN
INTRAMUSCULAR | Status: DC | PRN
  Administered 2011-08-27: 2 mg via INTRAVENOUS
  Administered 2011-08-27 (×2): 1 mg via INTRAVENOUS

## 2011-08-27 MED ORDER — PROPOFOL 10 MG/ML IV EMUL
INTRAVENOUS | Status: AC
  Filled 2011-08-27: qty 20

## 2011-08-27 MED ORDER — FENTANYL CITRATE 0.05 MG/ML IJ SOLN
INTRAMUSCULAR | Status: DC | PRN
  Administered 2011-08-27: 25 ug via INTRATHECAL
  Administered 2011-08-27: 25 ug via INTRAVENOUS
  Administered 2011-08-27: 50 ug via INTRAVENOUS

## 2011-08-27 MED ORDER — ONDANSETRON HCL 4 MG/2ML IJ SOLN
INTRAMUSCULAR | Status: AC
  Filled 2011-08-27: qty 2

## 2011-08-27 MED ORDER — GLYCOPYRROLATE 0.2 MG/ML IJ SOLN
0.2000 mg | Freq: Once | INTRAMUSCULAR | Status: AC | PRN
  Administered 2011-08-27: 0.2 mg via INTRAVENOUS

## 2011-08-27 MED ORDER — PROPOFOL 10 MG/ML IV EMUL
INTRAVENOUS | Status: DC | PRN
  Administered 2011-08-27: 11:00:00 via INTRAVENOUS
  Administered 2011-08-27: 25 ug/kg/min via INTRAVENOUS
  Administered 2011-08-27: 11:00:00 via INTRAVENOUS

## 2011-08-27 MED ORDER — BISACODYL 5 MG PO TBEC: 5.0000 mg | DELAYED_RELEASE_TABLET | Freq: Every day | ORAL | Status: DC | PRN

## 2011-08-27 MED ORDER — SODIUM CHLORIDE 0.9 % IR SOLN
Status: DC | PRN
  Administered 2011-08-27: 1000 mL

## 2011-08-27 MED ORDER — FLEET ENEMA 7-19 GM/118ML RE ENEM: 1.0000 | ENEMA | Freq: Once | RECTAL | Status: AC | PRN

## 2011-08-27 MED ORDER — FENTANYL CITRATE 0.05 MG/ML IJ SOLN: 25.0000 ug | INTRAMUSCULAR | Status: DC | PRN

## 2011-08-27 MED ORDER — BUPIVACAINE-EPINEPHRINE PF 0.5-1:200000 % IJ SOLN
INTRAMUSCULAR | Status: DC | PRN
  Administered 2011-08-27: 30 mL

## 2011-08-27 MED ORDER — GLYCOPYRROLATE 0.2 MG/ML IJ SOLN
INTRAMUSCULAR | Status: AC
  Administered 2011-08-27: 0.2 mg via INTRAVENOUS
  Filled 2011-08-27: qty 1

## 2011-08-27 MED ORDER — ACETAMINOPHEN 325 MG PO TABS: 325.0000 mg | ORAL_TABLET | ORAL | Status: DC | PRN

## 2011-08-27 MED ORDER — METOCLOPRAMIDE HCL 10 MG PO TABS: 5.0000 mg | ORAL_TABLET | Freq: Three times a day (TID) | ORAL | Status: DC | PRN

## 2011-08-27 MED ORDER — BUPIVACAINE IN DEXTROSE 0.75-8.25 % IT SOLN
INTRATHECAL | Status: DC | PRN
  Administered 2011-08-27: 15 mg via INTRATHECAL

## 2011-08-27 MED ORDER — ONDANSETRON HCL 4 MG/2ML IJ SOLN: 4.0000 mg | Freq: Once | INTRAMUSCULAR | Status: DC | PRN

## 2011-08-27 MED ORDER — DOCUSATE SODIUM 100 MG PO CAPS
100.0000 mg | ORAL_CAPSULE | Freq: Two times a day (BID) | ORAL | Status: DC
  Administered 2011-08-27 – 2011-08-31 (×9): 100 mg via ORAL
  Filled 2011-08-27 (×8): qty 1

## 2011-08-27 MED ORDER — METOCLOPRAMIDE HCL 5 MG/ML IJ SOLN: 5.0000 mg | Freq: Three times a day (TID) | INTRAMUSCULAR | Status: DC | PRN

## 2011-08-27 MED ORDER — ONDANSETRON HCL 4 MG/2ML IJ SOLN
4.0000 mg | Freq: Once | INTRAMUSCULAR | Status: AC
  Administered 2011-08-27: 4 mg via INTRAVENOUS

## 2011-08-27 MED ORDER — FENTANYL CITRATE 0.05 MG/ML IJ SOLN
INTRAMUSCULAR | Status: AC
  Filled 2011-08-27: qty 2

## 2011-08-27 MED ORDER — VANCOMYCIN HCL 1000 MG IV SOLR: 1500.0000 mg | Freq: Two times a day (BID) | INTRAVENOUS | Status: AC

## 2011-08-27 MED ORDER — MIDAZOLAM HCL 2 MG/2ML IJ SOLN
1.0000 mg | INTRAMUSCULAR | Status: DC | PRN
  Administered 2011-08-27: 2 mg via INTRAVENOUS

## 2011-08-27 MED ORDER — DIPHENHYDRAMINE HCL 12.5 MG/5ML PO ELIX: 12.5000 mg | ORAL_SOLUTION | ORAL | Status: DC | PRN

## 2011-08-27 MED ORDER — LACTATED RINGERS IV SOLN
INTRAVENOUS | Status: DC
  Administered 2011-08-27: 1000 mL via INTRAVENOUS

## 2011-08-27 MED ORDER — BUPIVACAINE-EPINEPHRINE PF 0.5-1:200000 % IJ SOLN
INTRAMUSCULAR | Status: AC
  Filled 2011-08-27: qty 20

## 2011-08-27 SURGICAL SUPPLY — 80 items
BAG HAMPER (MISCELLANEOUS) ×2 IMPLANT
BANDAGE ELASTIC 4 VELCRO NS (GAUZE/BANDAGES/DRESSINGS) ×3 IMPLANT
BANDAGE ELASTIC 6 VELCRO NS (GAUZE/BANDAGES/DRESSINGS) ×2 IMPLANT
BANDAGE ESMARK 4X12 BL STRL LF (DISPOSABLE) ×1 IMPLANT
BIT DRILL 2.5X110 QC LCP DISP (BIT) ×2 IMPLANT
BIT DRILL 2.8 (BIT) ×1
BIT DRILL 3.2MM (BIT) ×1 IMPLANT
BIT DRILL CANN QC 2.8X165 (BIT) ×1 IMPLANT
BIT DRILL QC 3.5X110 (BIT) ×1 IMPLANT
BLADE SURG SZ10 CARB STEEL (BLADE) ×2 IMPLANT
BNDG CMPR 12X4 ELC STRL LF (DISPOSABLE) ×1
BNDG COHESIVE 4X5 TAN STRL (GAUZE/BANDAGES/DRESSINGS) ×1 IMPLANT
BNDG ESMARK 4X12 BLUE STRL LF (DISPOSABLE) ×2
CHLORAPREP W/TINT 26ML (MISCELLANEOUS) ×3 IMPLANT
CLOTH BEACON ORANGE TIMEOUT ST (SAFETY) ×2 IMPLANT
COVER LIGHT HANDLE STERIS (MISCELLANEOUS) ×4 IMPLANT
CUFF TOURNIQUET SINGLE 34IN LL (TOURNIQUET CUFF) ×2 IMPLANT
DRAPE C-ARM FOLDED MOBILE STRL (DRAPES) ×2 IMPLANT
DRILL BIT 2.8MM (BIT) ×2
GAUZE XEROFORM 5X9 LF (GAUZE/BANDAGES/DRESSINGS) ×2 IMPLANT
GLOVE BIOGEL PI IND STRL 7.0 (GLOVE) IMPLANT
GLOVE BIOGEL PI INDICATOR 7.0 (GLOVE) ×2
GLOVE ECLIPSE 6.5 STRL STRAW (GLOVE) ×1 IMPLANT
GLOVE SKINSENSE NS SZ8.0 LF (GLOVE) ×1
GLOVE SKINSENSE STRL SZ8.0 LF (GLOVE) ×1 IMPLANT
GLOVE SS BIOGEL STRL SZ 6.5 (GLOVE) IMPLANT
GLOVE SS N UNI LF 8.5 STRL (GLOVE) ×2 IMPLANT
GLOVE SUPERSENSE BIOGEL SZ 6.5 (GLOVE) ×1
GOWN STRL REIN XL XLG (GOWN DISPOSABLE) ×6 IMPLANT
INST SET MINOR BONE (KITS) ×2 IMPLANT
K-WIRE 1.25 TRCR POINT 150 (WIRE)
K-WIRE 1.6X150 (WIRE)
K-WIRE 2.0X150M (WIRE)
KIT ROOM TURNOVER APOR (KITS) ×2 IMPLANT
KWIRE 1.25 TRCR POINT 150 (WIRE) IMPLANT
KWIRE 1.6X150 (WIRE) IMPLANT
KWIRE 2.0X150M (WIRE) IMPLANT
MANIFOLD NEPTUNE II (INSTRUMENTS) ×2 IMPLANT
NDL HYPO 21X1.5 SAFETY (NEEDLE) ×1 IMPLANT
NEEDLE HYPO 21X1.5 SAFETY (NEEDLE) ×2 IMPLANT
NS IRRIG 1000ML POUR BTL (IV SOLUTION) ×3 IMPLANT
PACK BASIC LIMB (CUSTOM PROCEDURE TRAY) ×2 IMPLANT
PAD ABD 5X9 TENDERSORB (GAUZE/BANDAGES/DRESSINGS) ×3 IMPLANT
PAD ARMBOARD 7.5X6 YLW CONV (MISCELLANEOUS) ×2 IMPLANT
PAD CAST 4YDX4 CTTN HI CHSV (CAST SUPPLIES) ×1 IMPLANT
PADDING CAST COTTON 4X4 STRL (CAST SUPPLIES) ×2
PLATE LCP 3.5 1/3 TUB 10HX117 (Plate) ×1 IMPLANT
PROS LCP PLATE 10 137M (Plate) ×2 IMPLANT
PROSTHESIS LCP PLATE 10 137M (Plate) IMPLANT
SCREW CANC FT 4.0X24 (Screw) ×1 IMPLANT
SCREW CANC FT ST SFS 4X14 (Screw) ×1 IMPLANT
SCREW CANC FT ST SFS 4X16 (Screw) ×2 IMPLANT
SCREW CANC FT/18 4.0 (Screw) ×1 IMPLANT
SCREW CANCEL 4.0 26MM (Screw) ×1 IMPLANT
SCREW CORTEX 3.5 14MM (Screw) ×2 IMPLANT
SCREW CORTEX 3.5 16MM (Screw) ×1 IMPLANT
SCREW CORTEX 3.5 18MM (Screw) ×2 IMPLANT
SCREW CORTEX 3.5 24MM (Screw) ×1 IMPLANT
SCREW CORTEX ST 4.5X68 (Screw) ×1 IMPLANT
SCREW LOCK CORT ST 3.5X14 (Screw) IMPLANT
SCREW LOCK CORT ST 3.5X16 (Screw) IMPLANT
SCREW LOCK CORT ST 3.5X18 (Screw) IMPLANT
SCREW LOCK CORT ST 3.5X24 (Screw) IMPLANT
SCREW LOCK T15 FT 18X3.5X2.9X (Screw) IMPLANT
SCREW LOCK T15 FT 20X3.5XST (Screw) IMPLANT
SCREW LOCKING 3.5X18 (Screw) ×2 IMPLANT
SCREW LOCKING 3.5X20 (Screw) ×2 IMPLANT
SET BASIN LINEN APH (SET/KITS/TRAYS/PACK) ×2 IMPLANT
SPLINT IMMOBILIZER J 3INX20FT (CAST SUPPLIES)
SPLINT J IMMOBILIZER 3X20FT (CAST SUPPLIES) IMPLANT
SPLINT J IMMOBILIZER 4X20FT (CAST SUPPLIES) ×1 IMPLANT
SPLINT J PLASTER J 4INX20Y (CAST SUPPLIES) ×1
SPONGE GAUZE 4X4 12PLY (GAUZE/BANDAGES/DRESSINGS) ×2 IMPLANT
SPONGE LAP 18X18 X RAY DECT (DISPOSABLE) ×2 IMPLANT
STAPLER VISISTAT 35W (STAPLE) ×2 IMPLANT
SUT ETHILON 3 0 FSL (SUTURE) IMPLANT
SUT MON AB 0 CT1 (SUTURE) ×1 IMPLANT
SUT MON AB 2-0 CT1 36 (SUTURE) ×1 IMPLANT
SYR 30ML LL (SYRINGE) ×2 IMPLANT
SYR BULB IRRIGATION 50ML (SYRINGE) ×2 IMPLANT

## 2011-08-27 NOTE — Progress Notes (Signed)
Physical Therapy Evaluation Patient Details Name: [name] [name] MRN: [id number] DOB: [birth year] Today's Date: 08/27/2011  Problem List:  Patient Active Problem List  Diagnoses  . Ankle fracture, bimalleolar, closed  . COPD (chronic obstructive pulmonary disease)  . DM (diabetes mellitus)  . HTN (hypertension)  . Coagulopathy  . Cirrhosis  . Depression  . Seizure disorder  . GERD (gastroesophageal reflux disease)  . Tobacco abuse    Past Medical History:  Past Medical History  Diagnosis Date  . Depression   . Cirrhosis   . Bipolar 1 disorder   . Hypertension   . Diabetes mellitus   . Seizures   . COPD (chronic obstructive pulmonary disease)   . Mental retardation   . Blind right eye   . GERD (gastroesophageal reflux disease)   . Chronic back pain   . Hepatic encephalopathy    Past Surgical History:  Past Surgical History  Procedure Date  . Tonsillectomy   . Ear operations     PT Assessment/Plan/Recommendation  Verbal intake taken.  Pt has a rolling walker at home; he states that due to his balance deficits he feels he would do best with a Rolling walker and not crutches.  Pt states there is only one small step to get into his house after this he will have no steps.   PT Goals   I ambulation with rolling walker.  PT Evaluation Precautions/Restrictions  Precautions Precautions: Fall Required Braces or Orthoses: No Restrictions Weight Bearing Restrictions: Yes RLE Weight Bearing: Non weight bearing Prior Functioning  Home Living Lives With: Spouse Receives Help From: Family Type of Home: House Home Layout: One level Home Access: Stairs to enter Secretary/administrator of Steps: 1 Bathroom Shower/Tub: Engineer, manufacturing systems: Standard Prior Function Driving: Yes Cognition Cognition Arousal/Alertness: Lethargic Overall Cognitive Status: Appears within functional limits for tasks assessed Orientation Level: Oriented  X4 Sensation/Coordination Coordination Gross Motor Movements are Fluid and Coordinated: Not tested Fine Motor Movements are Fluid and Coordinated: Not tested Extremity Assessment RUE Assessment RUE Assessment: Exceptions to [facility] LUE Assessment LUE Assessment: Within Functional Limits RLE Assessment RLE Assessment: Not tested LLE Assessment LLE Assessment: Within Functional Limits Mobility (including Balance) Bed Mobility Bed Mobility: No Transfers Transfers: No Ambulation/Gait Ambulation/Gait: No Stairs: No Wheelchair Mobility Wheelchair Mobility: No   End of Session  Will see pt on 08/28/2011 for NWB GT with rolling walker. [name],CINDY 08/27/2011, 2:23 PM

## 2011-08-27 NOTE — Anesthesia Procedure Notes (Signed)
Spinal  Patient location during procedure: OR Start time: 08/27/2011 9:50 AM Staffing CRNA/Resident: Glynn Octave Preanesthetic Checklist Completed: patient identified, site marked, surgical consent, pre-op evaluation, timeout performed, IV checked, risks and benefits discussed and monitors and equipment checked Spinal Block Patient position: right lateral decubitus Prep: Betadine Patient monitoring: heart rate, cardiac monitor, continuous pulse ox and blood pressure Approach: right paramedian Location: L3-4 Injection technique: single-shot Needle Needle type: Spinocan  Needle gauge: 22 G Needle length: 9 cm Assessment Sensory level: T4 Additional Notes 0950-  Marcaine .75%, 15mg , epi 1:1000,.1cc and fentanyl injected. Positive CSF aspiration pre and post injections. Tray # [id number], exp. 06/2012

## 2011-08-27 NOTE — Transfer of Care (Signed)
Immediate Anesthesia Transfer of Care Note  Patient: [name] [name]  Procedure(s) Performed:  OPEN REDUCTION INTERNAL FIXATION (ORIF) ANKLE FRACTURE  Patient Location: PACU  Anesthesia Type: Spinal  Level of Consciousness: awake, alert  and oriented  Airway & Oxygen Therapy: Patient Spontanous Breathing and Patient connected to face mask oxygen  Post-op Assessment: Report given to PACU RN  Post vital signs: Reviewed and stable  Complications: No apparent anesthesia complications

## 2011-08-27 NOTE — Op Note (Signed)
08/27/2011  12:26 PM  PATIENT:  [name] [name]  51 y.o. male  PRE-OPERATIVE DIAGNOSIS:  ankle fracture right  POST-OPERATIVE DIAGNOSIS:  ankle fracture right  PROCEDURE:  Procedure(s): OPEN REDUCTION INTERNAL FIXATION (ORIF) RIGHT ANKLE FRACTURE  SURGEON:  Surgeon(s): [name], MD  PHYSICIAN ASSISTANT:   ASSISTANTS: NICOLE SMALL   ANESTHESIA:   spinal  EBL:  Total I/O In: 600 [I.V.:600] Out: 10 [Blood:10]  BLOOD ADMINISTERED:none  DRAINS: (1) Hemovact drain(s) in the SUBQ with  Suction Open   LOCAL MEDICATIONS USED:  MARCAINE WITH EPI 30 CC  SPECIMEN:  No Specimen  DISPOSITION OF SPECIMEN:  N/A  COUNTS:  YES  TOURNIQUET:   Total Tourniquet Time Documented: Thigh (Right) - 111 minutes  DICTATION: .Dragon Dictation  PLAN OF CARE: Admit to inpatient   PATIENT DISPOSITION:  PACU - hemodynamically stable.   Delay start of Pharmacological VTE agent (>24hrs) due to surgical blood loss or risk of bleeding:  YES  Details of procedure  Date of surgery was 08/27/2011  Indications for procedure subluxation and displacement of right ankle fracture  After marking the right ankle as a surgical site the patient was taken to the operative spinal anesthetic. Placed in the lateral decubitus position with the right side up and his right leg was prepped and draped in sterile technique. The timeout procedure was executed  The limb was exsanguinated with a six-inch Esmarch  A lateral incision was made over the fibula. Full-thickness skin flaps were created. The bone was exposed and the fracture site was identified. The fracture is started to heal and had to be debrided. The fracture was difficult to reduce secondary to the time since the original injury and the contractures that have developed.  After subperiosteal dissection manual reduction clamp was used to hold the fracture in place and a interfrag screw was placed with AO technique. However this did not reduce the  fracture well and had to be removed. An interfrag screw was placed again and lateral plate was placed with a one third tubular plate radiographs confirmed that the reduction was not adequate.  This was removed. We then used a 3.5 DC plate with a combination of regular and then locking screws to reduce and stabilize the fracture. X-rays show that the talus was under the mortise. The mortise view is intact the free fibular fracture was reduced and out to length. The wound was then irrigated and closed with 0 Monocryl and interrupted and running fashion staples used to reapproximate skin edges 30 cc of Marcaine with epinephrine injected.  Posterior splint applied with the foot in neutral position. Note Hemovac drain placed in subcutaneous tissue prior to closure  Postop plan Hemovac drain for 48 hours. No weightbearing on right leg. DVT prophylaxis start tomorrow.

## 2011-08-27 NOTE — Progress Notes (Signed)
Utilization review completed.  

## 2011-08-27 NOTE — Anesthesia Postprocedure Evaluation (Signed)
  Anesthesia Post-op Note  Patient: [name] [name]  Procedure(s) Performed:  OPEN REDUCTION INTERNAL FIXATION (ORIF) ANKLE FRACTURE  Patient Location: PACU  Anesthesia Type: Spinal  Level of Consciousness: awake, alert  and oriented  Airway and Oxygen Therapy: Patient Spontanous Breathing  Post-op Pain: none  Post-op Assessment: Post-op Vital signs reviewed, Patient's Cardiovascular Status Stable, Respiratory Function Stable and No signs of Nausea or vomiting  Post-op Vital Signs: Reviewed and stable  Complications: No apparent anesthesia complications

## 2011-08-27 NOTE — Progress Notes (Signed)
Subjective: Having pain post surgery, no other complaints  Objective: Vital signs in last 24 hours: Temp:  [95.8 F (35.4 C)-98.1 F (36.7 C)] 97.5 F (36.4 C) (01/04 1343) Pulse Rate:  [60-80] 62  (01/04 1343) Resp:  [10-62] 16  (01/04 1343) BP: (130-146)/(61-80) 133/68 mmHg (01/04 1343) SpO2:  [93 %-100 %] 94 % (01/04 1343) Weight change:  Last BM Date: 08/26/11  Intake/Output from previous day: 01/03 0701 - 01/04 0700 In: 2416.3 [P.O.:880; I.V.:1536.3] Out: 1526 [Urine:1525; Stool:1] Total I/O In: 650 [I.V.:650] Out: 1090 [Urine:900; Drains:180; Blood:10]   Physical Exam: General: Alert, awake, oriented x3, in no acute distress. HEENT: No bruits, no goiter. Heart: Regular rate and rhythm, without murmurs, rubs, gallops. Lungs: Clear to auscultation bilaterally. Abdomen: Soft, nontender, nondistended, positive bowel sounds.    Lab Results: Basic Metabolic Panel:  Basename 08/25/11 1235  NA 139  K 3.6  CL 102  CO2 28  GLUCOSE 185*  BUN 9  CREATININE 0.67  CALCIUM 9.7  MG --  PHOS --   Liver Function Tests:  Basename 08/25/11 1235  AST 44*  ALT 16  ALKPHOS 117  BILITOT 2.3*  PROT 7.1  ALBUMIN 2.9*   No results found for this basename: LIPASE:2,AMYLASE:2 in the last 72 hours No results found for this basename: AMMONIA:2 in the last 72 hours CBC:  Basename 08/25/11 1235  WBC 4.7  NEUTROABS 1.6*  HGB 13.1  HCT 39.1  MCV 102.9*  PLT 111*   Cardiac Enzymes: No results found for this basename: CKTOTAL:3,CKMB:3,CKMBINDEX:3,TROPONINI:3 in the last 72 hours BNP: No results found for this basename: PROBNP:3 in the last 72 hours D-Dimer: No results found for this basename: DDIMER:2 in the last 72 hours CBG:  Basename 08/27/11 1640 08/27/11 1228 08/27/11 0910 08/26/11 2227 08/26/11 1704 08/26/11 1114  GLUCAP 85 80 88 104* 100* 97   Hemoglobin A1C:  Basename 08/25/11 1235  HGBA1C 5.2   Fasting Lipid Panel: No results found for this basename:  CHOL,HDL,LDLCALC,TRIG,CHOLHDL,LDLDIRECT in the last 72 hours Thyroid Function Tests: No results found for this basename: TSH,T4TOTAL,FREET4,T3FREE,THYROIDAB in the last 72 hours Anemia Panel: No results found for this basename: VITAMINB12,FOLATE,FERRITIN,TIBC,IRON,RETICCTPCT in the last 72 hours Coagulation:  Basename 08/26/11 0446 08/25/11 1235  LABPROT 18.2* 19.0*  INR 1.48 1.56*   Urine Drug Screen: Drugs of Abuse     Component Value Date/Time   LABOPIA NONE DETECTED 03/03/2011 1738   COCAINSCRNUR NONE DETECTED 03/03/2011 1738   LABBENZ POSITIVE* 03/03/2011 1738   AMPHETMU NONE DETECTED 03/03/2011 1738   THCU NONE DETECTED 03/03/2011 1738   LABBARB NONE DETECTED 03/03/2011 1738    Alcohol Level: No results found for this basename: ETH:2 in the last 72 hours  Recent Results (from the past 240 hour(s))  SURGICAL PCR SCREEN     Status: Normal   Collection Time   08/26/11  3:43 PM      Component Value Range Status Comment   MRSA, PCR NEGATIVE  NEGATIVE  Final    Staphylococcus aureus NEGATIVE  NEGATIVE  Final     Studies/Results: Dg Ankle 2 Views Right  08/27/2011  *RADIOLOGY REPORT*  Clinical Data: Right ankle fracture, ORIF  RIGHT ANKLE - 2 VIEW  Comparison: Four digital C-arm fluoroscopic images obtained intraoperatively are compared to the preoperative exam of 08/01/2011  Findings: Placement of a lateral plate and nine screws across a reduced oblique distal right fibular diaphyseal fracture. Widening of medial ankle mortise and tilt of talus. No additional fracture or dislocation identified.  Posterior malleolar fracture fragment seen on preceding exam is not radiographically evident on obtained intraoperative C-arm images.  IMPRESSION: Post ORIF of distal fibular diaphyseal fracture.  Original Report Authenticated By: Burnetta Sabin, M.D.   Dg C-arm 61-120 Min-no Report  08/27/2011  CLINICAL DATA: Right Ankle Fracture   C-ARM 61-120 MINUTES  Fluoroscopy was utilized by the requesting  physician.  No radiographic  interpretation.      Medications: Scheduled Meds:   . ALPRAZolam  0.5 mg Oral TID  . docusate sodium  100 mg Oral BID  . enoxaparin  30 mg Subcutaneous Q12H  . ezetimibe  10 mg Oral Daily  . fentaNYL      . FLUoxetine  20 mg Oral Daily  . guaiFENesin  600 mg Oral BID  . insulin aspart  0-15 Units Subcutaneous TID WC  . insulin aspart  0-5 Units Subcutaneous QHS  . lactulose  20 g Oral TID  . levETIRAcetam  500 mg Oral BID  . lidocaine      . metoprolol  50 mg Oral TID  . midazolam      . midazolam      . midazolam      . nicotine  21 mg Transdermal Daily  . ondansetron      . ondansetron (ZOFRAN) IV  4 mg Intravenous Once  . pantoprazole  40 mg Oral Q1200  . propofol      . propofol      . propofol      . rifaximin  550 mg Oral BID  . vancomycin  1,500 mg Intravenous 120 min pre-op  . vancomycin  1,500 mg Intravenous Q12H  . DISCONTD: chlorhexidine  60 mL Topical Once  . DISCONTD: chlorhexidine  60 mL Topical Once  . DISCONTD: vancomycin  1,500 mg Intravenous 120 min pre-op   Continuous Infusions:   . sodium chloride 75 mL/hr (08/26/11 1807)  . DISCONTD: lactated ringers 1,000 mL (08/27/11 0845)   PRN Meds:.albuterol, bisacodyl, diphenhydrAMINE, glycopyrrolate, HYDROcodone-acetaminophen, HYDROmorphone, metoCLOPramide (REGLAN) injection, metoCLOPramide, ondansetron (ZOFRAN) IV, ondansetron, senna-docusate, sodium phosphate, DISCONTD: acetaminophen, DISCONTD: Bupivacaine-Epinephrine PF, DISCONTD: fentaNYL, DISCONTD: midazolam, DISCONTD: ondansetron (ZOFRAN) IV, DISCONTD: sodium chloride irrigation  Assessment/Plan:  Principal Problem:  *Ankle fracture, bimalleolar, closed Active Problems:  COPD (chronic obstructive pulmonary disease)  DM (diabetes mellitus)  HTN (hypertension)  Coagulopathy  Cirrhosis  Depression  Seizure disorder  GERD (gastroesophageal reflux disease)  Tobacco abuse   Plan:  Patient is stable post op No  respiratory complaints at this time, vitals stable, blood sugars stable Would continue current medical management Will follow up tomorrow.   LOS: 2 days   Rhaelyn Giron [facility]  08/27/2011, 6:40 PM

## 2011-08-27 NOTE — Brief Op Note (Signed)
08/25/2011 - 08/27/2011  12:26 PM  PATIENT:  [name] [name]  51 y.o. male  PRE-OPERATIVE DIAGNOSIS:  ankle fracture right  POST-OPERATIVE DIAGNOSIS:  ankle fracture right  PROCEDURE:  Procedure(s): OPEN REDUCTION INTERNAL FIXATION (ORIF) RIGHT ANKLE FRACTURE  SURGEON:  Surgeon(s): [name], MD  PHYSICIAN ASSISTANT:   ASSISTANTS: NICOLE SMALL   ANESTHESIA:   spinal  EBL:  Total I/O In: 600 [I.V.:600] Out: 10 [Blood:10]  BLOOD ADMINISTERED:none  DRAINS: (1) Hemovact drain(s) in the SUBQ with  Suction Open   LOCAL MEDICATIONS USED:  MARCAINE WITH EPI 30 CC  SPECIMEN:  No Specimen  DISPOSITION OF SPECIMEN:  N/A  COUNTS:  YES  TOURNIQUET:   Total Tourniquet Time Documented: Thigh (Right) - 111 minutes  DICTATION: .Dragon Dictation  PLAN OF CARE: Admit to inpatient   PATIENT DISPOSITION:  PACU - hemodynamically stable.   Delay start of Pharmacological VTE agent (>24hrs) due to surgical blood loss or risk of bleeding:  YES

## 2011-08-27 NOTE — Anesthesia Preprocedure Evaluation (Addendum)
Anesthesia Evaluation  Patient identified by MRN, date of birth, ID band Patient awake    Reviewed: Allergy & Precautions, H&P , NPO status , Patient's Chart, lab work & pertinent test results, reviewed documented beta blocker date and time   Airway Mallampati: III TM Distance: <3 FB Neck ROM: Full    Dental  (+) Edentulous Upper and Edentulous Lower   Pulmonary COPDCurrent Smoker,    Pulmonary exam normal       Cardiovascular hypertension, Pt. on medications and Pt. on home beta blockers Regular Normal    Neuro/Psych Seizures -, Well Controlled,  PSYCHIATRIC DISORDERS Depression Bipolar Disorder    GI/Hepatic GERD-  Medicated and Controlled,(+) Cirrhosis -       ,   Endo/Other  Diabetes mellitus-, Well Controlled, Type 2, Insulin Dependent  Renal/GU      Musculoskeletal   Abdominal (+) obese,  Abdomen: soft.    Peds  Hematology   Anesthesia Other Findings   Reproductive/Obstetrics                          Anesthesia Physical Anesthesia Plan  ASA: III  Anesthesia Plan: Spinal   Post-op Pain Management:    Induction: Intravenous  Airway Management Planned: Simple Face Mask  Additional Equipment:   Intra-op Plan:   Post-operative Plan:   Informed Consent: I have reviewed the patients History and Physical, chart, labs and discussed the procedure including the risks, benefits and alternatives for the proposed anesthesia with the patient or authorized representative who has indicated his/her understanding and acceptance.     Plan Discussed with: CRNA  Anesthesia Plan Comments:         Anesthesia Quick Evaluation

## 2011-08-27 NOTE — Interval H&P Note (Signed)
History and Physical Interval Note:  08/27/2011 9:00 AM  [name] [name]  has presented today for surgery, with the diagnosis of ankle fracture right  The various methods of treatment have been discussed with the patient and family. After consideration of risks, benefits and other options for treatment, the patient has consented to  Procedure(s): OPEN REDUCTION INTERNAL FIXATION (ORIF) RIGHT  ANKLE FRACTURE as a surgical intervention .  The patients' history has been reviewed, patient examined, no change in status, stable for surgery.  I have reviewed the patients' chart and labs.  Questions were answered to the patient's satisfaction.     [name]

## 2011-08-28 LAB — COMPREHENSIVE METABOLIC PANEL
ALT: 12 U/L (ref 0–53)
AST: 39 U/L — ABNORMAL HIGH (ref 0–37)
Calcium: 8.8 mg/dL (ref 8.4–10.5)
Creatinine, Ser: 0.57 mg/dL (ref 0.50–1.35)
GFR calc Af Amer: >90 mL/min (ref >90)
Sodium: 134 mEq/L — ABNORMAL LOW (ref 135–145)
Total Protein: 6.2 g/dL (ref 6.0–8.3)

## 2011-08-28 LAB — GLUCOSE, CAPILLARY
Glucose-Capillary: 96 mg/dL (ref 70–99)
Glucose-Capillary: 120 mg/dL — ABNORMAL HIGH (ref 70–99)
Glucose-Capillary: 114 mg/dL — ABNORMAL HIGH (ref 70–99)
Glucose-Capillary: 102 mg/dL — ABNORMAL HIGH (ref 70–99)

## 2011-08-28 NOTE — Progress Notes (Signed)
Physical Therapy Treatment Patient Details Name: [name] [name] MRN: [id number] DOB: [birth year] Today's Date: 08/28/2011  PT Assessment/Plan  PT - Assessment/Plan Comments on Treatment Session: Pt was able to tolerate bed and seated exercises today. Pt had c/o pain on (R) ankle region (PS8/10) and nursing was made aware. Pt perform sit<>stand activities using a RW given Max A, presented with posterior leaning and requires constant cues to assume upright posture. Pt had difficulty placing (L) foot in complete stance during standing at this time. Patient may benefit SNF for rehab.  PT Plan: Discharge plan remains appropriate;Frequency remains appropriate Follow Up Recommendations: Skilled nursing facility Equipment Recommended: Defer to next venue PT Goals  Acute Rehab PT Goals PT Goal Formulation: With patient/family Time For Goal Achievement: 7 days Pt will go Sit to Stand: with max assist;with cues (comment type and amount);Other (comment) (50% verbal cues for task sequencing/segmentation ) Pt will go Stand to Sit: with min assist Pt will Transfer Bed to Chair/Chair to Bed: with mod assist Pt will Ambulate: 1 - 15 feet;with max assist  PT Treatment Precautions/Restrictions  Precautions Precautions: Fall Required Braces or Orthoses: No Restrictions Weight Bearing Restrictions: Yes RLE Weight Bearing: Non weight bearing Mobility (including Balance) Bed Mobility Bed Mobility: Yes Rolling Right: 6: Modified independent (Device/Increase time);With rail Rolling Left: 6: Modified independent (Device/Increase time);With rail Right Sidelying to Sit: 6: Modified independent (Device/Increase time) Left Sidelying to Sit: 6: Modified independent (Device/Increase time) Sitting - Scoot to Edge of Bed: 5: Supervision Sit to Supine - Right: 6: Modified independent (Device/Increase time) Sit to Supine - Left: 6: Modified independent (Device/Increase time) Scooting to [facility]: 5:  Supervision Scooting to [facility] Details (indicate cue type and reason): with 50% verbal cues, (L) knee bent to aid in scooting on [facility] Ambulation/Gait Ambulation/Gait: No (Pt presented with posterior leaning ) Stairs: No Wheelchair Mobility Wheelchair Mobility: No  Posture/Postural Control Posture/Postural Control: No significant limitations Exercise  Total Joint Exercises Ankle Circles/Pumps: AROM;Both;10 reps;Seated Quad Sets: AROM;10 reps;Supine;Other (comment) (with cues for proper sequencing ) Heel Slides: AROM;10 reps;Supine Hip ABduction/ADduction: AROM;10 reps;Supine Knee Flexion: AROM;Supine End of Session PT - End of Session Equipment Utilized During Treatment: Gait belt;Other (comment) (RW) Activity Tolerance: Patient tolerated treatment well;Patient limited by fatigue;Patient limited by pain Nurse Communication: Mobility status for transfers;Weight bearing status General Behavior During Session: [facility] for tasks performed Cognition: [facility] for tasks performed  [name] [name], [name] [name] 08/28/2011, 1:51 PM

## 2011-08-28 NOTE — Progress Notes (Signed)
Subjective: Still has some pain in legs, denies shortness of breath or cough.  Objective: Vital signs in last 24 hours: Temp:  [97 F (36.1 C)-98.4 F (36.9 C)] 98.4 F (36.9 C) (01/05 0516) Pulse Rate:  [60-80] 73  (01/05 0516) Resp:  [11-20] 20  (01/05 0516) BP: (130-166)/(61-79) 166/79 mmHg (01/05 0516) SpO2:  [92 %-100 %] 92 % (01/05 0516) Weight change:  Last BM Date: 08/26/11  Intake/Output from previous day: 01/04 0701 - 01/05 0700 In: 1010 [P.O.:360; I.V.:650] Out: 1500 [Urine:1225; Drains:265; Blood:10] Total I/O In: -  Out: 700 [Urine:700]   Physical Exam: General: Alert, awake, oriented x3, in no acute distress. HEENT: No bruits, no goiter. Heart: Regular rate and rhythm, without murmurs, rubs, gallops. Lungs: Clear to auscultation bilaterally. Abdomen: Soft, nontender, nondistended, positive bowel sounds. Extremities: No clubbing cyanosis or edema with positive pedal pulses. Neuro: Grossly intact, nonfocal.    Lab Results: Basic Metabolic Panel:  Basename 08/28/11 0704 08/25/11 1235  NA 134* 139  K 3.8 3.6  CL 101 102  CO2 30 28  GLUCOSE 108* 185*  BUN 5* 9  CREATININE 0.57 0.67  CALCIUM 8.8 9.7  MG -- --  PHOS -- --   Liver Function Tests:  [facility] 08/28/11 0704 08/25/11 1235  AST 39* 44*  ALT 12 16  ALKPHOS 96 117  BILITOT 2.7* 2.3*  PROT 6.2 7.1  ALBUMIN 2.7* 2.9*   No results found for this basename: LIPASE:2,AMYLASE:2 in the last 72 hours No results found for this basename: AMMONIA:2 in the last 72 hours CBC:  Basename 08/25/11 1235  WBC 4.7  NEUTROABS 1.6*  HGB 13.1  HCT 39.1  MCV 102.9*  PLT 111*   Cardiac Enzymes: No results found for this basename: CKTOTAL:3,CKMB:3,CKMBINDEX:3,TROPONINI:3 in the last 72 hours BNP: No results found for this basename: PROBNP:3 in the last 72 hours D-Dimer: No results found for this basename: DDIMER:2 in the last 72 hours CBG:  Basename 08/28/11 1100 08/28/11 0726 08/27/11 2112  08/27/11 1640 08/27/11 1228 08/27/11 0910  GLUCAP 120* 96 88 85 80 88   Hemoglobin A1C:  Basename 08/25/11 1235  HGBA1C 5.2   Fasting Lipid Panel: No results found for this basename: CHOL,HDL,LDLCALC,TRIG,CHOLHDL,LDLDIRECT in the last 72 hours Thyroid Function Tests: No results found for this basename: TSH,T4TOTAL,FREET4,T3FREE,THYROIDAB in the last 72 hours Anemia Panel: No results found for this basename: VITAMINB12,FOLATE,FERRITIN,TIBC,IRON,RETICCTPCT in the last 72 hours Coagulation:  Basename 08/26/11 0446 08/25/11 1235  LABPROT 18.2* 19.0*  INR 1.48 1.56*   Urine Drug Screen: Drugs of Abuse     Component Value Date/Time   LABOPIA NONE DETECTED 03/03/2011 1738   COCAINSCRNUR NONE DETECTED 03/03/2011 1738   LABBENZ POSITIVE* 03/03/2011 1738   AMPHETMU NONE DETECTED 03/03/2011 1738   THCU NONE DETECTED 03/03/2011 1738   LABBARB NONE DETECTED 03/03/2011 1738    Alcohol Level: No results found for this basename: ETH:2 in the last 72 hours  Recent Results (from the past 240 hour(s))  SURGICAL PCR SCREEN     Status: Normal   Collection Time   08/26/11  3:43 PM      Component Value Range Status Comment   MRSA, PCR NEGATIVE  NEGATIVE  Final    Staphylococcus aureus NEGATIVE  NEGATIVE  Final     Studies/Results: Dg Ankle 2 Views Right  08/27/2011  *RADIOLOGY REPORT*  Clinical Data: Right ankle fracture, ORIF  RIGHT ANKLE - 2 VIEW  Comparison: Four digital C-arm fluoroscopic images obtained intraoperatively are compared to the  preoperative exam of 08/01/2011  Findings: Placement of a lateral plate and nine screws across a reduced oblique distal right fibular diaphyseal fracture. Widening of medial ankle mortise and tilt of talus. No additional fracture or dislocation identified. Posterior malleolar fracture fragment seen on preceding exam is not radiographically evident on obtained intraoperative C-arm images.  IMPRESSION: Post ORIF of distal fibular diaphyseal fracture.  Original  Report Authenticated By: Burnetta Sabin, M.D.   Dg C-arm 61-120 Min-no Report  08/27/2011  CLINICAL DATA: Right Ankle Fracture   C-ARM 61-120 MINUTES  Fluoroscopy was utilized by the requesting physician.  No radiographic  interpretation.      Medications: Scheduled Meds:   . ALPRAZolam  0.5 mg Oral TID  . docusate sodium  100 mg Oral BID  . enoxaparin  30 mg Subcutaneous Q12H  . ezetimibe  10 mg Oral Daily  . fentaNYL      . FLUoxetine  20 mg Oral Daily  . guaiFENesin  600 mg Oral BID  . insulin aspart  0-15 Units Subcutaneous TID WC  . insulin aspart  0-5 Units Subcutaneous QHS  . lactulose  20 g Oral TID  . levETIRAcetam  500 mg Oral BID  . lidocaine      . metoprolol  50 mg Oral TID  . midazolam      . midazolam      . midazolam      . nicotine  21 mg Transdermal Daily  . ondansetron      . pantoprazole  40 mg Oral Q1200  . propofol      . propofol      . propofol      . rifaximin  550 mg Oral BID  . vancomycin  1,500 mg Intravenous Q12H  . DISCONTD: chlorhexidine  60 mL Topical Once   Continuous Infusions:   . sodium chloride 10 mL/hr at 08/28/11 1013  . DISCONTD: lactated ringers 1,000 mL (08/27/11 0845)   PRN Meds:.albuterol, bisacodyl, diphenhydrAMINE, HYDROcodone-acetaminophen, HYDROmorphone, metoCLOPramide (REGLAN) injection, metoCLOPramide, ondansetron (ZOFRAN) IV, ondansetron, senna-docusate, sodium phosphate, DISCONTD: acetaminophen, DISCONTD: Bupivacaine-Epinephrine PF, DISCONTD: fentaNYL, DISCONTD: midazolam, DISCONTD: ondansetron (ZOFRAN) IV, DISCONTD: sodium chloride irrigation  Assessment/Plan:  Principal Problem:  *Ankle fracture, bimalleolar, closed Active Problems:  COPD (chronic obstructive pulmonary disease)  DM (diabetes mellitus)  HTN (hypertension)  Coagulopathy  Cirrhosis  Depression  Seizure disorder  GERD (gastroesophageal reflux disease)  Tobacco abuse  Plan:  Patient is stable and doing well post operatively.  His blood pressure  is reasonably controlled, and the mildly elevated pressures can also be related to pain.  It would be reasonable to continue him on his current dosing upon discharge and have further titration per her primary care doctor. His COPD is also stable, continue incentive spirometry and outpt meds On lactulose and xifaxan for liver disease, appears compensated, mental status appears to be at baseline. Would continue these on discharge. Would not discharge him on any medication for diabetes. His blood sugars have remained mostly in normal range and his A1C is 5.2.  Would encourage low carb diet.  Discharge Disposition per orthopedics  We will sign off, please let us know if we can be of any further assistance.   LOS: 3 days   Adelaido Nicklaus [facility] 08/28/2011, 12:19 PM

## 2011-08-28 NOTE — Progress Notes (Signed)
Subjective: 1 Day Post-Op Procedure(s) (LRB): OPEN REDUCTION INTERNAL FIXATION (ORIF) ANKLE FRACTURE (Right) Patient reports pain as mild.    Objective: Vital signs in last 24 hours: Temp:  [97 F (36.1 C)-98.4 F (36.9 C)] 98.4 F (36.9 C) (01/05 0516) Pulse Rate:  [60-80] 73  (01/05 0516) Resp:  [11-20] 20  (01/05 0516) BP: (130-166)/(61-79) 166/79 mmHg (01/05 0516) SpO2:  [92 %-100 %] 92 % (01/05 0516)  Intake/Output from previous day: 01/04 0701 - 01/05 0700 In: 1010 [P.O.:360; I.V.:650] Out: 1500 [Urine:1225; Drains:265; Blood:10] Intake/Output this shift:     Basename 08/25/11 1235  HGB 13.1    Basename 08/25/11 1235  WBC 4.7  RBC 3.80*  HCT 39.1  PLT 111*    Basename 08/28/11 0704 08/25/11 1235  NA 134* 139  K 3.8 3.6  CL 101 102  CO2 30 28  BUN 5* 9  CREATININE 0.57 0.67  GLUCOSE 108* 185*  CALCIUM 8.8 9.7    Basename 08/26/11 0446 08/25/11 1235  LABPT -- --  INR 1.48 1.56*    Neurologically intact Neurovascular intact Sensation intact distally  Assessment/Plan: 1 Day Post-Op Procedure(s) (LRB): OPEN REDUCTION INTERNAL FIXATION (ORIF) ANKLE FRACTURE (Right) Advance diet Up with therapy D/C IV fluids  [name] [name] 08/28/2011, 8:55 AM

## 2011-08-29 LAB — COMPREHENSIVE METABOLIC PANEL
AST: 31 U/L (ref 0–37)
Albumin: 2.5 g/dL — ABNORMAL LOW (ref 3.5–5.2)
BUN: 7 mg/dL (ref 6–23)
Chloride: 103 mEq/L (ref 96–112)
Creatinine, Ser: 0.58 mg/dL (ref 0.50–1.35)
Potassium: 3.8 mEq/L (ref 3.5–5.1)
Total Bilirubin: 2.3 mg/dL — ABNORMAL HIGH (ref 0.3–1.2)
Total Protein: 6 g/dL (ref 6.0–8.3)

## 2011-08-29 LAB — GLUCOSE, CAPILLARY
Glucose-Capillary: 94 mg/dL (ref 70–99)
Glucose-Capillary: 114 mg/dL — ABNORMAL HIGH (ref 70–99)

## 2011-08-29 NOTE — Progress Notes (Signed)
Physical Therapy Treatment Patient Details Name: [name] [name] MRN: [id number] DOB: 1961-05-08 Today's Date: 08/29/2011  PT Assessment/Plan   PT Goals     PT Treatment Precautions/Restrictions  Precautions Precautions: Fall Required Braces or Orthoses: No Restrictions Weight Bearing Restrictions: Yes RLE Weight Bearing: Non weight bearing Mobility (including Balance)   Pt asleep; unable to arouse.  Rx not given today    End of Session    Hadleigh Felber,CINDY 08/29/2011, 11:23 AM

## 2011-08-29 NOTE — Anesthesia Postprocedure Evaluation (Signed)
  Anesthesia Post-op Note  Patient: [name] [name]  Procedure(s) Performed:  MINOR CAST APPLICATION - no anesthesia , procedure room do not need or room !  Patient Location: PACU  Anesthesia Type: Spinal  Level of Consciousness: awake, alert  and oriented  Airway and Oxygen Therapy: Patient Spontanous Breathing  Post-op Pain: mild  Post-op Assessment: Post-op Vital signs reviewed, Patient's Cardiovascular Status Stable, Respiratory Function Stable and No signs of Nausea or vomiting  Post-op Vital Signs: Reviewed and stable  Complications: No apparent anesthesia complications.  Pt. Now on the 3rd floor and working with PT.  Pt denies any problems with headache or backache and sensation has returned to normal.  No apparent anesthesia complications.

## 2011-08-29 NOTE — Progress Notes (Signed)
Physical Therapy Treatment Patient Details Name: KSHAWN CANAL MRN: [id number] DOB: 06/13/1961 Today's Date: 08/29/2011  PT Assessment/Plan  PT - Assessment/Plan Comments on Treatment Session: Pt more alert when checked on after lunch.  Pt states that he has not gotten out of bed for several weeks thinking that his ankle would get better. PT Plan: Discharge plan remains appropriate Follow Up Recommendations: Skilled nursing facility Equipment Recommended: Defer to next venue PT Goals  Acute Rehab PT Goals PT Goal Formulation: With patient  PT Treatment Precautions/Restrictions  Precautions Precautions: Fall Required Braces or Orthoses: No Restrictions Weight Bearing Restrictions: Yes RLE Weight Bearing: Non weight bearing Mobility (including Balance) Bed Mobility Bed Mobility: Yes Rolling Right: 7: Independent Sitting - Scoot to Edge of Bed: 6: Modified independent (Device/Increase time) Transfers Sit to Stand: 5: Supervision Stand to Sit: 5: Supervision Stand Pivot Transfers: 4: Min assist Ambulation/Gait Ambulation/Gait: No    Exercise    End of Session PT - End of Session Equipment Utilized During Treatment: Gait belt Activity Tolerance: Patient tolerated treatment well Patient left: in chair General Behavior During Session: [facility] for tasks performed Cognition: [facility] for tasks performed  RUSSELL,CINDY 08/29/2011, 1:08 PM

## 2011-08-29 NOTE — Progress Notes (Signed)
Subjective: 2 Days Post-Op Procedure(s) (LRB): OPEN REDUCTION INTERNAL FIXATION (ORIF) ANKLE FRACTURE (Right) Patient reports pain as mild.    Objective: Vital signs in last 24 hours: Temp:  [99.1 F (37.3 C)-99.8 F (37.7 C)] 99.8 F (37.7 C) (01/06 0428) Pulse Rate:  [75-81] 76  (01/06 0428) Resp:  [16-20] 20  (01/06 0428) BP: (111-167)/(60-84) 111/60 mmHg (01/06 0428) SpO2:  [85 %-93 %] 90 % (01/06 0428)  Intake/Output from previous day: 01/05 0701 - 01/06 0700 In: 120 [P.O.:120] Out: 1530 [Urine:1525; Drains:5] Intake/Output this shift:    No results found for this basename: HGB:5 in the last 72 hours No results found for this basename: WBC:2,RBC:2,HCT:2,PLT:2 in the last 72 hours  Basename 08/29/11 0717 08/28/11 0704  NA 137 134*  K 3.8 3.8  CL 103 101  CO2 31 30  BUN 7 5*  CREATININE 0.58 0.57  GLUCOSE 98 108*  CALCIUM 8.8 8.8   No results found for this basename: LABPT:2,INR:2 in the last 72 hours  mild drainage   Assessment/Plan: 2 Days Post-Op Procedure(s) (LRB): OPEN REDUCTION INTERNAL FIXATION (ORIF) ANKLE FRACTURE (Right) PLAN DRESSING CHANGE AND CAST APPLICATION Monday IN PROCEDURE ROOM   [name] 08/29/2011, 10:44 AM

## 2011-08-30 ENCOUNTER — Encounter (HOSPITAL_COMMUNITY): Payer: Self-pay | Admitting: Anesthesiology

## 2011-08-30 ENCOUNTER — Encounter (HOSPITAL_COMMUNITY): Payer: Self-pay | Admitting: Orthopedic Surgery

## 2011-08-30 ENCOUNTER — Encounter (HOSPITAL_COMMUNITY)
Admission: AD | Disposition: A | Discharge status: Home Health | Payer: Self-pay | Source: Ambulatory Visit | Attending: Orthopedic Surgery

## 2011-08-30 DIAGNOSIS — S82843A Displaced bimalleolar fracture of unspecified lower leg, initial encounter for closed fracture: Secondary | ICD-10-CM

## 2011-08-30 HISTORY — PX: CAST APPLICATION: SHX380

## 2011-08-30 LAB — COMPREHENSIVE METABOLIC PANEL
AST: 33 U/L (ref 0–37)
Alkaline Phosphatase: 89 U/L (ref 39–117)
BUN: 7 mg/dL (ref 6–23)
CO2: 30 mEq/L (ref 19–32)
Chloride: 100 mEq/L (ref 96–112)
Creatinine, Ser: 0.54 mg/dL (ref 0.50–1.35)
GFR calc non Af Amer: >90 mL/min (ref >90)
Potassium: 3.4 mEq/L — ABNORMAL LOW (ref 3.5–5.1)
Total Bilirubin: 2 mg/dL — ABNORMAL HIGH (ref 0.3–1.2)

## 2011-08-30 LAB — GLUCOSE, CAPILLARY: Glucose-Capillary: 85 mg/dL (ref 70–99)

## 2011-08-30 SURGERY — MINOR CAST APPLICATION
Anesthesia: Topical | Laterality: Right

## 2011-08-30 NOTE — Progress Notes (Signed)
Occupational Therapy Evaluation Patient Details Name: [name] [name] MRN: [id number] DOB: 1960-10-24 Today's Date: 08/30/2011  Problem List:  Patient Active Problem List  Diagnoses  . Ankle fracture, bimalleolar, closed  . COPD (chronic obstructive pulmonary disease)  . DM (diabetes mellitus)  . HTN (hypertension)  . Coagulopathy  . Cirrhosis  . Depression  . Seizure disorder  . GERD (gastroesophageal reflux disease)  . Tobacco abuse    Past Medical History:  Past Medical History  Diagnosis Date  . Depression   . Cirrhosis   . Bipolar 1 disorder   . Hypertension   . Diabetes mellitus   . Seizures   . COPD (chronic obstructive pulmonary disease)   . Mental retardation   . Blind right eye   . GERD (gastroesophageal reflux disease)   . Chronic back pain   . Hepatic encephalopathy    Past Surgical History:  Past Surgical History  Procedure Date  . Tonsillectomy   . Ear operations   . Cast application 08/30/2011    Procedure: MINOR CAST APPLICATION;  Surgeon: Fuller Canada, MD;  Location: [facility];  Service: Orthopedics;  Laterality: Right;  no anesthesia , procedure room do not need or room !    OT Assessment/Plan/Recommendation OT Assessment OT Recommendation/Assessment: Patient will need skilled OT in the acute care venue OT Problem List: Decreased cognition;Decreased safety awareness;Decreased knowledge of use of DME or AE;Decreased knowledge of precautions;Decreased coordination;Impaired vision/perception;Decreased activity tolerance;Decreased strength Problem List Comments: Hard of hearing and does not have hearing aid with him. Pateint unable to maintain NWB RLE. Barriers to Discharge Comments: Patient's cognitive status with inability to maintain NWB status. OT Therapy Diagnosis : Cognitive deficits;Generalized weakness OT Plan OT Frequency: Min 2X/week OT Treatment/Interventions: Therapeutic exercise;Self-care/ADL training;Therapeutic activities;Cognitive  remediation/compensation;Patient/family education;Balance training OT Recommendation Follow Up Recommendations: Skilled nursing facility Equipment Recommended: Defer to next venue Individuals Consulted Consulted and Agree with Results and Recommendations: Patient OT Goals Acute Rehab OT Goals OT Goal Formulation: With patient Time For Goal Achievement: 7 days ADL Goals Pt Will Perform Lower Body Bathing: with supervision;with modified independence Pt Will Perform Lower Body Dressing: with modified independence;with supervision;Sit to stand from bed Pt Will Transfer to Toilet: with modified independence;with supervision;Maintaining weight bearing status Pt Will Perform Toileting - Clothing Manipulation: with supervision;with modified independence;with cueing (comment type and amount) Pt Will Perform Tub/Shower Transfer: Tub transfer;with modified independence;with supervision;Maintaining weight bearing status  OT Evaluation Precautions/Restrictions  Precautions Precautions: Fall Precaution Comments: Patient unable to state NWB status. even after therapist repeated this to him several times. Required Braces or Orthoses: No Restrictions Weight Bearing Restrictions: Yes RLE Weight Bearing: Non weight bearing Prior Functioning Home Living Lives With: Spouse Receives Help From: Family Type of Home: House Home Layout: One level Home Access: Stairs to enter Secretary/administrator of Steps: 1 Bathroom Shower/Tub: Engineer, manufacturing systems: Standard Prior Function Level of Independence: Independent with basic ADLs;Independent with homemaking with ambulation Driving: Yes ADL ADL Eating/Feeding: Independent;Set up Where Assessed - Eating/Feeding: Bed level Grooming: Simulated;Set up Where Assessed - Grooming: Sitting, bed Upper Body Bathing: Performed;Right arm;Left arm;Chest;Abdomen;Set up Where Assessed - Upper Body Bathing: Sitting, bed Lower Body Bathing:  Simulated;Supervision/safety Lower Body Bathing Details (indicate cue type and reason): Patient will need someone to remine him and assist in NWB status. Where Assessed - Lower Body Bathing: Sitting, bed Upper Body Dressing: Simulated;Set up Where Assessed - Upper Body Dressing: Sitting, bed Lower Body Dressing: Performed Where Assessed - Lower Body Dressing: Sitting, bed Toilet  Transfer: Not assessed Toileting - Clothing Manipulation: Not assessed Tub/Shower Transfer: Not assessed Vision/Perception  Vision - History Baseline Vision: Wears glasses all the time Patient Visual Report: No change from baseline Vision - Assessment Eye Alignment: Impaired (comment) Vision Assessment: Vision not tested Perception Perception: Impaired Praxis Praxis: Impaired Praxis Impairment Details: Ideomotor Cognition Cognition Arousal/Alertness: Awake/alert Overall Cognitive Status: Impaired Attention: Impaired Current Attention Level: Focused;Sustained;Alternating;Divided Memory: Appears impaired Memory Deficits: Does not remember or recall NWB status for ankle Orientation Level: Oriented X4 Following Commands: Follows one step commands inconsistently;Follows multi-step commands with increased time;Follows multi-step commands inconsistently Safety/Judgement: Decreased awareness of safety precautions Decreased Safety/Judgement: Decreased awareness of need for assistance Awareness of Errors: Decreased awareness of errors made Decreased Awareness of Errors: Assistance required to identify errors made Awareness of Deficits: Decreased awareness of deficits Problem Solving: Requires assistance for problem solving Sensation/Coordination Sensation Light Touch: Appears Intact Stereognosis: Not tested Hot/Cold: Not tested Proprioception: Not tested Extremity Assessment RUE Assessment RUE Assessment: Within Functional Limits LUE Assessment LUE Assessment: Within Functional Limits Mobility  Bed  Mobility Bed Mobility: Yes Rolling Right: 7: Independent Rolling Left: 6: Modified independent (Device/Increase time) Supine to Sit: 6: Modified independent (Device/Increase time) Sitting - Scoot to Edge of Bed: 6: Modified independent (Device/Increase time) Sit to Supine: 6: Modified independent (Device/Increase time) Scooting to [facility]: 6: Modified independent (Device/Increase time)   End of Session OT - End of Session Activity Tolerance: Patient tolerated treatment well Patient left: in bed Nurse Communication: Other (comment) (handed off to nursing at end of treatment) General Behavior During Session: [facility] for tasks performed Cognition: Impaired, at baseline   Lisa Roca OTR/L 08/30/2011, 3:28 PM

## 2011-08-30 NOTE — Progress Notes (Signed)
CARE MANAGEMENT NOTE 08/30/2011  Patient:  [name] [name], [name] [name]   Account Number:  [id number]  Date Initiated:  08/30/2011  Documentation initiated by:  Rosemary Holms  Subjective/Objective Assessment:   Pt admitted with fracture on R ankle. Spoke to patient who said he took care of the house since his wife works.     Action/Plan:   Pt agreed for CM to contact wife about his HH needs. CM spoke to wife who stated she could not take care of him at home. SW notified.   Anticipated DC Date:  09/01/2011   Anticipated DC Plan:  SKILLED NURSING FACILITY  In-house referral  Clinical Social Worker      DC Planning Services  CM consult      Choice offered to / List presented to:             Status of service:  In process, will continue to follow Medicare Important Message given?   (If response is "NO", the following Medicare IM given date fields will be blank) Date Medicare IM given:   Date Additional Medicare IM given:    Discharge Disposition:    Per UR Regulation:    Comments:  08/30/11 1430 Neyra Pettie Leanord Hawking RN BSN CM

## 2011-08-30 NOTE — Interval H&P Note (Signed)
History and Physical Interval Note:  08/30/2011 12:42 PM  [name] [name]  has presented today for surgery, with the diagnosis of fracture right ankle  The various methods of treatment have been discussed with the patient and family. After consideration of risks, benefits and other options for treatment, the patient has consented to  Procedure(s): MINOR CAST APPLICATION RIGHT ANKLE  as a surgical intervention .  The patients' history has been reviewed, patient examined, no change in status, stable for surgery.  I have reviewed the patients' chart and labs.  Questions were answered to the patient's satisfaction.     [name]

## 2011-08-30 NOTE — Progress Notes (Signed)
Patient back to room per dr Romeo Apple. No new order reported to leanne rn.

## 2011-08-30 NOTE — Progress Notes (Signed)
Patient ID: [name] [name], male   DOB: 1961/02/16, 51 y.o.   MRN: [id number] Diagnosis right ankle fracture status post open treatment internal fixation  Post procedure diagnosis same  Procedure dressing change and splint application  After site marking and chart update consent signed the previous splint was removed the wound was checked the drain was taken out a new dressing was applied and a posterior splint was applied with the foot in neutral position  Patient in stable condition discharge back to floor continue orders as written

## 2011-08-30 NOTE — H&P (View-Only) (Signed)
Patient ID: [name] [name], male   DOB: 03-24-1961, 51 y.o.   MRN: [id number] Repeat x-ray in this noncompliant patient who has liver disease and heart failure shows that his fracture is subluxating.  Recommend admission to the hospital medical consult and then surgery on the ankle if we can get anesthesia to anesthetize him  If not then we'll have to refer him to the medical service at [facility] and get an orthopaedic consult there  Direct admission  Separate identifiable x-ray RIGHT ankle  Previous x-rays are noted the fracture is now subluxated and displaced  Impression displaced subluxated bimalleolar, possible trimalleolar fracture

## 2011-08-30 NOTE — Progress Notes (Signed)
Found patient crawling on floor stating he was leaving the hospital and that we could not "hold him hostage".  MD paged, but no return phone call.  Patient's wife called and attempted to try to calm him down.  Spoke with patient about the benefits of staying overnight vs. Leaving AMA.  Patient decided to stay overnight.  Assisted patient back to bed.  Set bed alarm and continued to monitor patient.  Schonewitz, Candelaria Stagers 08/30/2011

## 2011-08-30 NOTE — Progress Notes (Signed)
Physical Therapy Treatment Patient Details Name: [name] [name] MRN: [id number] DOB: 02-06-1961 Today's Date: 08/30/2011  TIME: [phone]/22mins GT- TE   PT Assessment/Plan  PT - Assessment/Plan Comments on Treatment Session: Pt did well today with EOB exercises and was able to amb with RW;Min A RLE NWB 4', however pt was very fatigued last 2' and was MOD A PT Goals  Acute Rehab PT Goals PT Goal: Sit to Stand - Progress: Met PT Goal: Stand to Sit - Progress: Met PT Goal: Ambulate - Progress: Progressing toward goal  PT Treatment Precautions/Restrictions  Precautions Precautions: Fall Required Braces or Orthoses: No Restrictions Weight Bearing Restrictions: Yes RLE Weight Bearing: Non weight bearing Mobility (including Balance) Bed Mobility Left Sidelying to Sit: 6: Modified independent (Device/Increase time) Sitting - Scoot to Edge of Bed: 6: Modified independent (Device/Increase time) Sit to Supine - Left: 6: Modified independent (Device/Increase time) Scooting to [facility]: 6: Modified independent (Device/Increase time) Transfers Transfers: Yes Sit to Stand: 5: Supervision Sit to Stand Details (indicate cue type and reason): verbal cues for hand placement Stand to Sit: 5: Supervision Stand to Sit Details: verbal cues to reach back for surface Ambulation/Gait Ambulation/Gait: Yes Ambulation/Gait Assistance: 4: Min assist Ambulation/Gait Assistance Details (indicate cue type and reason): Verbal and visual cues for gait sequencing of RW and use of UEs to redistribute weight from NWB RLE Ambulation Distance (Feet): 4 Feet Assistive device: Rolling walker Gait velocity: very slow;fatigued quickly    Exercise  General Exercises - Upper Extremity Shoulder Flexion: Both;10 reps Shoulder Horizontal ADduction: Both;10 reps General Exercises - Lower Extremity Long Arc Quad: Both;10 reps Hip Flexion/Marching: Both;10 reps Toe Raises: 20 reps;Left Heel Raises: Left;10  reps End of Session PT - End of Session Equipment Utilized During Treatment: Gait belt Activity Tolerance: Patient limited by fatigue;Patient tolerated treatment well Patient left: in bed;with call bell in reach;with bed alarm set (nursing presnent) Nurse Communication: Mobility status for ambulation General Behavior During Session: [facility] for tasks performed Cognition: [facility] for tasks performed  Xavier Munger ATKINSO 08/30/2011, 10:13 AM

## 2011-08-31 LAB — GLUCOSE, CAPILLARY: Glucose-Capillary: 107 mg/dL — ABNORMAL HIGH (ref 70–99)

## 2011-08-31 MED ORDER — HYDROCODONE-ACETAMINOPHEN 7.5-325 MG PO TABS: 1.0000 | ORAL_TABLET | ORAL | Status: AC | PRN

## 2011-08-31 NOTE — Progress Notes (Signed)
CARE MANAGEMENT NOTE 08/31/2011  Patient:  ESGAR, BARNICK   Account Number:  [id number]  Date Initiated:  08/30/2011  Documentation initiated by:  Rosemary Holms  Subjective/Objective Assessment:   Pt admitted with fracture on R ankle. Spoke to patient who said he took care of the house since his wife works.     Action/Plan:   Pt agreed for CM to contact wife about his HH needs. CM spoke to wife who stated she could not take care of him at home. SW notified.   Anticipated DC Date:  09/01/2011   Anticipated DC Plan:  SKILLED NURSING FACILITY  In-house referral  Clinical Social Worker      DC Planning Services  CM consult      Choice offered to / List presented to:     DME arranged  WHEELCHAIR - MANUAL  BEDSIDE COMMODE      DME agency  [facility]     HH arranged  HH-2 PT  HH-1 RN  HH-3 OT  HH-4 NURSE'S AIDE      HH agency  [facility]   Status of service:  Completed, signed off Medicare Important Message given?   (If response is "NO", the following Medicare IM given date fields will be blank) Date Medicare IM given:   Date Additional Medicare IM given:    Discharge Disposition:  HOME W HOME HEALTH SERVICES  Per UR Regulation:    Comments:  08/31/11 1400 Olie Dibert RN BSN  08/30/11 1430 Cheril Slattery Leanord Hawking RN BSN CM

## 2011-08-31 NOTE — Plan of Care (Signed)
Problem: Discharge Progression Outcomes Goal: Other Discharge Outcomes/Goals Outcome: Completed/Met Date Met:  08/31/11 Pt has home health RN PT SW to assist   Discharged to home with wife

## 2011-08-31 NOTE — Plan of Care (Signed)
Problem: Discharge Progression Outcomes Goal: Barriers To Progression Addressed/Resolved Outcome: Completed/Met Date Met:  08/31/11 Pt going home with home health and equipment for family to care for him

## 2011-08-31 NOTE — Discharge Summary (Signed)
Physician Discharge Summary  Patient ID: [name] [name] MRN: [id number] DOB/AGE: 1960/12/24 51 y.o.  Admit date: 08/25/2011 Discharge date: 08/31/2011  Admission Diagnoses: right ankle fracture   Discharge Diagnoses: right ankle fracture  Principal Problem:  *Ankle fracture, bimalleolar, closed Active Problems:  COPD (chronic obstructive pulmonary disease)  DM (diabetes mellitus)  HTN (hypertension)  Coagulopathy  Cirrhosis  Depression  Seizure disorder  GERD (gastroesophageal reflux disease)  Tobacco abuse   Discharged Condition: stable  Hospital Course: unremarkable   Consults: none  Significant Diagnostic Studies: none   Treatments: surgery: otif right ankle Friday jan 4  Splint change mon jan 7   Discharge Exam: Blood pressure 134/78, pulse 67, temperature 98 F (36.7 C), temperature source Oral, resp. rate 16, height 5' 6"  (1.676 m), weight 100.9 kg (222 lb 7.1 oz), SpO2 93.00%. General appearance: alert  Disposition: Home or Self Care  Discharge Orders    Future Appointments: Provider: Department: Dept Phone: Center:   09/06/2011 2:30 PM Maree Krabbe [facility] [phone] None   09/06/2011 3:00 PM Mohamed K. Julien Nordmann, MD [facility] [phone] None     Future Orders Please Complete By Expires   Ambulatory referral to [place]      Comments:   Please evaluate LABRANDON KNOCH for admission to [facility].  Disciplines requested: Physical Therapy  Services to provide: Other: nwb right foot  Physician to follow patient's care (the person listed here will be responsible for signing ongoing orders): Referring Provider  Requested Start of Care Date: Tomorrow  Special Instructions:  none   Diet - low sodium heart healthy      Call MD / Call 911      Comments:   If you experience chest pain or shortness of breath, CALL 911 and be transported to the hospital emergency room.  If you develope a fever above 101 F, pus (white drainage) or increased  drainage or redness at the wound, or calf pain, call your surgeon's office.   Constipation Prevention      Comments:   Drink plenty of fluids.  Prune juice may be helpful.  You may use a stool softener, such as Colace (over the counter) 100 mg twice a day.  Use MiraLax (over the counter) for constipation as needed.   Increase activity slowly as tolerated      Weight Bearing as taught in Physical Therapy      Comments:   Use a walker or crutches as instructed.   Discharge instructions      Comments:   No weight right foot     Medication List  As of 08/31/2011 12:54 PM   CHANGE how you take these medications         HYDROcodone-acetaminophen 7.5-325 MG per tablet   Commonly known as: NORCO   Take 1 tablet by mouth every 4 (four) hours as needed for pain.   What changed: - how often to take the med - reasons to take the med - doctor's instructions         CONTINUE taking these medications         ALPRAZolam 0.5 MG tablet   Commonly known as: XANAX      ezetimibe 10 MG tablet   Commonly known as: ZETIA      FLUoxetine 20 MG capsule   Commonly known as: PROZAC      furosemide 20 MG tablet   Commonly known as: LASIX      lactulose 10 GM/15ML solution  Commonly known as: CHRONULAC      levETIRAcetam 500 MG tablet   Commonly known as: KEPPRA      metoprolol 50 MG tablet   Commonly known as: LOPRESSOR      omeprazole 20 MG capsule   Commonly known as: PRILOSEC      potassium chloride SA 20 MEQ tablet   Commonly known as: K-DUR,KLOR-CON      rifaximin 200 MG tablet   Commonly known as: XIFAXAN          Where to get your medications    These are the prescriptions that you need to pick up.   You may get these medications from any pharmacy.         HYDROcodone-acetaminophen 7.5-325 MG per tablet           Follow-up Information    Follow up with [name], MD. Make an appointment in 8 days.   Contact information:   [address], [place] [address] [phone]         appt in 8 days   Remove splint xray, staples out , cast  Signed: [name] 08/31/2011, 12:54 PM

## 2011-08-31 NOTE — Plan of Care (Signed)
Problem: Discharge Progression Outcomes Goal: Complications resolved/controlled Outcome: Progressing Cast to rt lower extremity

## 2011-08-31 NOTE — Progress Notes (Signed)
Discharge summary sent to payer through [facility]  

## 2011-08-31 NOTE — Progress Notes (Signed)
Physical Therapy Treatment Patient Details Name: [name] [name] MRN: [id number] DOB: Jan 17, 1961 Today's Date: 08/31/2011 Time: 850-918 Charges: Therex x 8' Gait x 15' PT Assessment/Plan  PT - Assessment/Plan Comments on Treatment Session: Pt did well with therapy today. Pt was able to ambulate for increased distance with increased ease. Pt states that he would like to use a wheel chair. Pt educated on the importance of getting up and using muscles. Pt with increased activity tolerance this session. PT Goals  Acute Rehab PT Goals PT Goal: Sit to Stand - Progress: Met PT Goal: Stand to Sit - Progress: Met PT Goal: Ambulate - Progress: Met  PT Treatment Precautions/Restrictions  Precautions Precautions: Fall Precaution Comments: Patient unable to state NWB status. even after therapist repeated this to him several times. Required Braces or Orthoses: No Restrictions Weight Bearing Restrictions: Yes RLE Weight Bearing: Non weight bearing Mobility (including Balance) Bed Mobility Bed Mobility: Yes Rolling Right: 7: Independent Right Sidelying to Sit: 7: Independent Sitting - Scoot to Edge of Bed: 7: Independent Sit to Supine: 7: Independent Scooting to HOB: 6: Modified independent (Device/Increase time) Transfers Sit to Stand: 5: Supervision Stand to Sit: 5: Supervision Stand to Sit Details: Vc's for safe technique Ambulation/Gait Ambulation/Gait Assistance: 4: Min assist Ambulation/Gait Assistance Details (indicate cue type and reason): VC's for proper sequenceing for NWB status. Ambulation Distance (Feet): 20 Feet Assistive device: Rolling walker Gait Pattern:  (NWB R) Gait velocity: slow Stairs: No Wheelchair Mobility Wheelchair Mobility: No    Exercise  General Exercises - Lower Extremity Long Arc Quad: 10 reps;Seated;Both Hip Flexion/Marching: 10 reps;Both End of Session PT - End of Session Equipment Utilized During Treatment: Gait belt (Rolling walker) Activity  Tolerance: Patient tolerated treatment well Patient left: in bed;with call bell in reach;with family/visitor present Nurse Communication: Mobility status for ambulation General Behavior During Session: [facility] for tasks performed Cognition: [facility] for tasks performed  Antonieta Iba 08/31/2011, 9:30 AM

## 2011-09-03 ENCOUNTER — Other Ambulatory Visit: Payer: Self-pay | Admitting: Internal Medicine

## 2011-09-03 DIAGNOSIS — C3490 Malignant neoplasm of unspecified part of unspecified bronchus or lung: Secondary | ICD-10-CM

## 2011-09-06 ENCOUNTER — Other Ambulatory Visit: Payer: Self-pay | Admitting: *Deleted

## 2011-09-06 ENCOUNTER — Ambulatory Visit: Payer: Medicare Other | Admitting: Internal Medicine

## 2011-09-06 ENCOUNTER — Other Ambulatory Visit: Payer: Medicare Other | Admitting: Lab

## 2011-09-06 DIAGNOSIS — D696 Thrombocytopenia, unspecified: Secondary | ICD-10-CM

## 2011-09-08 ENCOUNTER — Encounter: Payer: Self-pay | Admitting: Orthopedic Surgery

## 2011-09-08 ENCOUNTER — Ambulatory Visit (INDEPENDENT_AMBULATORY_CARE_PROVIDER_SITE_OTHER): Payer: Medicare Other | Admitting: Orthopedic Surgery

## 2011-09-08 VITALS — BP 140/80 | Ht 66.0 in | Wt 222.0 lb

## 2011-09-08 DIAGNOSIS — S82843A Displaced bimalleolar fracture of unspecified lower leg, initial encounter for closed fracture: Secondary | ICD-10-CM

## 2011-09-08 NOTE — Progress Notes (Signed)
Patient ID: [name] [name], male   DOB: 08-07-1961, 51 y.o.   MRN: [id number]    Postop visit #1  Date of surgery January 2  Open treatment internal fixation RIGHT ankle fracture after failed cast treatment  Very difficult fracture.  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  The x-rays show that the ankle mortise is intact.  There is a medial malleolar fracture which was not fixated with internal metal because of skin problems.  He is placed in a short leg cast with a mold.  He will x-ray again in the cast in 4 weeks.  X-ray report 3 views RIGHT ankle for fracture care followup  Lateral plate is seen fixating a bimalleolar fracture.  The mortise is intact.  There is a posterior malleolar fracture less than 20%.  So this is really a trimalleolar fracture.  Impression internal fixation RIGHT ankle lateral plate medial and posterior malleolar fragments or not fixated, the medial fragment is nondisplaced posterior malleolar fragment is less than 20%

## 2011-09-08 NOTE — Patient Instructions (Signed)
Keep  Cast dry   Do not get wet   If it gets wet dry with a hair dryer on low setting and call the office

## 2011-09-13 ENCOUNTER — Encounter (HOSPITAL_COMMUNITY): Payer: Self-pay | Admitting: Orthopedic Surgery

## 2011-10-06 ENCOUNTER — Ambulatory Visit (INDEPENDENT_AMBULATORY_CARE_PROVIDER_SITE_OTHER): Payer: Medicare Other | Admitting: Orthopedic Surgery

## 2011-10-06 ENCOUNTER — Encounter: Payer: Self-pay | Admitting: Orthopedic Surgery

## 2011-10-06 VITALS — Ht 66.0 in | Wt 222.0 lb

## 2011-10-06 DIAGNOSIS — S82843A Displaced bimalleolar fracture of unspecified lower leg, initial encounter for closed fracture: Secondary | ICD-10-CM

## 2011-10-06 NOTE — Progress Notes (Signed)
Patient ID: [name] [name], male   DOB: May 29, 1961, 51 y.o.   MRN: [id number] Postoperative visit  Date of surgery August 25, 2011  Last visit January 16.  RIGHT ankle fracture  Lateral malleolar plate  Patient noncompliant cast is worn through, he should post to be nonweightbearing  Cast was removed he has a chronically plantigrade valgus foot  X-ray shows no change in position of fracture hardware intact  On the oblique view the mortise looks intact  On the extreme oblique view there appears to be some subluxation of talus  Recasted neutral position with a varus mold  X-rays in one month.  X-ray report 3 views RIGHT ankle  RIGHT ankle fracture  Status post internal fixation  Lateral plate fixed as a fibular fracture which is stable.  The attempted AP view is actually perfect mortise view and the mortise is intact  Fracture appears to be healing  Impression healing fibular fracture with nondisplaced medial malleolar fracture no fixation

## 2011-10-06 NOTE — Patient Instructions (Signed)
Keep  Cast dry   Do not get wet   If it gets wet dry with a hair dryer on low setting and call the office   No weight bearing

## 2011-10-13 ENCOUNTER — Telehealth: Payer: Self-pay | Admitting: Orthopedic Surgery

## 2011-10-13 NOTE — Telephone Encounter (Signed)
[name] [name]'s wife asked if [name] still needs to be using the wheelchair.  Says he mostly uses the walker and if the chair is not needed She is going to send it back.

## 2011-10-14 NOTE — Telephone Encounter (Signed)
Ok to send it back

## 2011-10-18 NOTE — Telephone Encounter (Signed)
Advised of doctor's reply

## 2011-11-03 ENCOUNTER — Ambulatory Visit (INDEPENDENT_AMBULATORY_CARE_PROVIDER_SITE_OTHER): Payer: Medicare Other | Admitting: Orthopedic Surgery

## 2011-11-03 ENCOUNTER — Encounter: Payer: Self-pay | Admitting: Orthopedic Surgery

## 2011-11-03 VITALS — BP 122/70 | Ht 66.0 in | Wt 222.0 lb

## 2011-11-03 DIAGNOSIS — S82843A Displaced bimalleolar fracture of unspecified lower leg, initial encounter for closed fracture: Secondary | ICD-10-CM

## 2011-11-03 NOTE — Progress Notes (Signed)
X-ray report  AP lateral oblique RIGHT ankle  Lateral plate fixing lateral malleolus fracture.  Medial malleolus fracture nondisplaced.  Mortise intact  Fracture healing properly Healing bimalleolar fracture with lateral plate.

## 2011-11-03 NOTE — Progress Notes (Signed)
Patient ID: [name] [name], male   DOB: 1961/07/20, 51 y.o.   MRN: [id number] Chief Complaint  Patient presents with  . Follow-up    Right ankle fracture.   Her surgery was January 4  Treatment internal fixation of the RIGHT ankle with a lateral malleolar plate he also has a nondisplaced medial malleolus fracture  He is in a cast.  X-rays in cast show fracture stable.  Healing.  No subluxation noted.  Recommend followup in April for repeat x-ray out of plaster

## 2011-12-01 ENCOUNTER — Encounter: Payer: Self-pay | Admitting: Orthopedic Surgery

## 2011-12-01 ENCOUNTER — Ambulatory Visit: Payer: Medicare Other | Admitting: Orthopedic Surgery

## 2011-12-06 ENCOUNTER — Encounter: Payer: Self-pay | Admitting: Internal Medicine

## 2011-12-07 ENCOUNTER — Ambulatory Visit (INDEPENDENT_AMBULATORY_CARE_PROVIDER_SITE_OTHER): Payer: Medicare Other | Admitting: Urgent Care

## 2011-12-07 ENCOUNTER — Encounter: Payer: Self-pay | Admitting: Urgent Care

## 2011-12-07 VITALS — BP 131/67 | HR 71 | Temp 98.7°F | Ht 67.0 in | Wt 218.0 lb

## 2011-12-07 DIAGNOSIS — K920 Hematemesis: Secondary | ICD-10-CM

## 2011-12-07 DIAGNOSIS — K219 Gastro-esophageal reflux disease without esophagitis: Secondary | ICD-10-CM

## 2011-12-07 DIAGNOSIS — D696 Thrombocytopenia, unspecified: Secondary | ICD-10-CM

## 2011-12-07 DIAGNOSIS — D649 Anemia, unspecified: Secondary | ICD-10-CM

## 2011-12-07 DIAGNOSIS — K7460 Unspecified cirrhosis of liver: Secondary | ICD-10-CM

## 2011-12-07 HISTORY — DX: Hematemesis: K92.0

## 2011-12-07 NOTE — Progress Notes (Signed)
Primary Care Physician:  Carolee Rota, NP, NP Primary Gastroenterologist:  Dr. Gala Romney  Chief Complaint  Patient presents with  . low HGB   HPI:  [name] [name] is a 51 y.o. male here for further management of anemia. We initially met [name] [name] back in June of last year when he was admitted for hepatic encephalopathy. 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 He had a positive AMA, negative ANA, negative viral hepatitis markers, negative ceruloplasmin and 24-hour urinary copper, and negative (high) alpha-1 antitrypsin.  He had a normal AFP, CT of his abdomen and pelvis which showed cirrhosis, portal venous hypertension with moderate splenomegaly, spontaneous splenorenal shunt, mesenteric varices, cholelithiasis, and nonspecific gallbladder wall thickening up to 6 mm w/may be due to the hepatocellular disease.  He also had an EGD by Dr. Gala Romney on 02/02/11 where  have mild portal gastropathy and no evidence of esophageal varices. After discharge, he did not return for followup.  He was sent back to [place] for evaluation of anemia today.  He had been complaining of fatigue and was seen by Carolee Rota, NP & was found to have a hemoglobin of 8.4 on 11/29/11 with a platelet count of 88. Four months ago, his hemoglobin was 13.1.  He denies any rectal bleeding or melena. He does admit to "spitting up blood"on several occasions. He had an episode of epistaxis yesterday.  He does take Baby ASA daily, as well as Advil arthritis when necessary.  Gives Hx of always being a "free bleeder."  C/o fatigue & malaise.  Denies any chest pain, shortness breath, or palpitations.  He is daily not taking lactulose daily, just prn because he was haqving too many stools.  No further episodes of confusion. He does take an occasional Vicodin for pain.  c/o mid-abdominal pain,  that is mild and intermittent. He does have daily heartburn and indigestion. He is on Prilosec  20 mg daily. By our records, his weight is down 18 kg in the past 10 months.    Past Medical History  Diagnosis Date  . Depression   . 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 Bipolar 1 disorder   . Hypertension   . Diabetes mellitus   . Seizures   . COPD (chronic obstructive pulmonary disease)   . Mental retardation   . Blind right eye   . GERD (gastroesophageal reflux disease)   . Chronic back pain   . Hepatic encephalopathy   . Anemia   . Thrombocytopenia   . Cardiomegaly   . Anxiety   . Arthritis   . Cholelithiasis     Asymptomatic  . Cerebrovascular disease     Right vertebral artery  . Hard of hearing   . Hyperlipidemia   . Cataract     Right eye    Past Surgical History  Procedure Date  . Tonsillectomy   . Ear operations   . Cast application 03/27/1659    Procedure: MINOR CAST APPLICATION;  Surgeon: [name], MD;  Location: [facility];  Service: Orthopedics;  Laterality: Right;  no anesthesia , procedure room do not need or room !  . Orif ankle fracture 08/27/2011    Procedure: OPEN REDUCTION INTERNAL FIXATION (ORIF) ANKLE FRACTURE;  Surgeon: [name], MD;  Location: [facility];  Service: Orthopedics;  Laterality: Right;  . Esophagogastroduodenoscopy 02/02/2011    Rourk-Normal esophagus.  No varices/Question mild portal gastropathy, extrinsic compression on the antrum, lesser curvature  of uncertain significance, some minimally  nodular mucosa status post biopsy, patent pylorus, normal duodenum 1 and duodenum 2.    Current Outpatient Prescriptions  Medication Sig Dispense Refill  . ALPRAZolam (XANAX) 0.5 MG tablet Take 0.5 mg by mouth 3 (three) times daily.        Marland Kitchen ezetimibe (ZETIA) 10 MG tablet Take 10 mg by mouth daily.        Marland Kitchen FLUoxetine (PROZAC) 20 MG capsule Take 20 mg by mouth daily.        . furosemide (LASIX) 20 MG tablet Take 20 mg by mouth daily.        Marland Kitchen HYDROcodone-acetaminophen (NORCO) 7.5-325 MG per tablet Take 1 tablet by mouth every 4 (four) hours  as needed.      . lactulose (CHRONULAC) 10 GM/15ML solution Take 20 g by mouth 3 (three) times daily.        Marland Kitchen levETIRAcetam (KEPPRA) 500 MG tablet Take 500 mg by mouth 2 (two) times daily.        . metoprolol (LOPRESSOR) 50 MG tablet Take 50 mg by mouth 3 (three) times daily.        Marland Kitchen omeprazole (PRILOSEC) 20 MG capsule Take 20 mg by mouth daily.        . potassium chloride SA (K-DUR,KLOR-CON) 20 MEQ tablet Take 20 mEq by mouth daily.        . rifaximin (XIFAXAN) 200 MG tablet Take 550 mg by mouth 2 (two) times daily.          Allergies as of 12/07/2011 - Review Complete 12/07/2011  Allergen Reaction Noted  . Penicillins Rash 02/02/2011    Family History  Problem Relation Age of Onset  . Heart failure Mother   . Thyroid disease Mother   . Cancer Father   . Asthma Brother   . Heart attack Brother   . Cirrhosis Brother     EtOH    History   Social History  . Marital Status: Single    Spouse Name: N/A    Number of Children: 0  . Years of Education: N/A   Occupational History  . disabled    Social History Main Topics  . Smoking status: Current Everyday Smoker -- 1.0 packs/day for 30 years    Types: Cigarettes  . Smokeless tobacco: Not on file  . Alcohol Use: No  . Drug Use: No  . Sexually Active: Not on file  Review of Systems: Gen:  see history of present illness  CV: Denies chest pain, angina, palpitations, syncope, orthopnea, PND, peripheral edema, and claudication. Resp: Denies dyspnea at rest, dyspnea with exercise, cough, sputum, wheezing, coughing up blood, and pleurisy. GI: Denies vomiting blood, jaundice, and fecal incontinence.   Denies dysphagia or odynophagia. GU : Denies urinary burning, blood in urine, urinary frequency, urinary hesitancy, nocturnal urination, and urinary incontinence. MS:  chronic low back and joint pain and stiffness. History of arthritis.  Derm: Denies rash, itching, dry skin, hives, moles, warts, or unhealing ulcers.  Psych: Denies  memory loss, suicidal ideation, hallucinations, paranoia, and confusion. Heme:  +easy bruising. Neuro:  Denies any headaches, dizziness, paresthesias. Endo:  Denies any problems with DM, thyroid, adrenal function.  Physical Exam: BP 131/67  Pulse 71  Temp(Src) 98.7 F (37.1 C) (Temporal)  Ht 5' 7"  (1.702 m)  Wt 218 lb (98.884 kg)  BMI 34.14 kg/m2 General:   Alert, obese, pleasant and cooperative in NAD.  Accompanied By his wife today. Head:  Normocephalic and atraumatic.  Eyes:  Right sclera cloudy.  no icterus.   Conjunctiva pink. Ears: Hard of hearing Nose:  No deformity, discharge, or lesions. Mouth: duplicate uvula. No lesions,oropharynx pink & moist. Neck:  Supple; no masses or thyromegaly. Lungs:  Clear throughout to auscultation.   No wheezes, crackles, or rhonchi. No acute distress. Heart:  Regular rate and rhythm; no murmurs, clicks, rubs,  or gallops. Abdomen:  Normal bowel sounds.  No bruits.  Soft, non-tender and non-distended.  no tense ascites. Liver is palpable 4 fingerbreadths below the right costal margin. Positive splenomegaly. No guarding or rebound tenderness.   Rectal:   small amount dark brown stool obtained from vault which was Hemoccult positive.no internal/external masses palpated.  Msk:  Symmetrical without gross deformities. Normal posture. Pulses:  Normal pulses noted. Extremities: + Clubbing. No asterixis. Right lower leg in a hard cast and boot. He has 1+ pitting edema bilaterally.  Neurologic:  Alert and  oriented x4;   mentally challenged. At his baseline.  Skin:  Intact without significant lesions or rashes. Lymph Nodes:  No significant cervical adenopathy.

## 2011-12-07 NOTE — Patient Instructions (Addendum)
Please go get your labs today-I will call you with results You need an EGD & colonoscopy with Dr Lazarus Gowda will need an ultrasound of your liver  Do not use Aspirin, Advil, or any other anti-inflammatories! Gastrointestinal Bleeding Gastrointestinal (GI) bleeding is bleeding from the gut or any place between your mouth and anus. If bleeding is slow, you may be allowed to go home. If there is a lot of bleeding, hospitalization and observation are often required. SYMPTOMS   You vomit bright red blood or material that looks like coffee grounds.   You have blood in your stools or the stools look black and tarry.  DIAGNOSIS  Your caregiver may diagnose your condition by taking a history and a physical exam. More tests may be needed, including:  X-rays.   EGD (esophagogastroduodenoscopy), which looks at your esophagus, stomach, and small bowel through a flexible telescope-like instrument.   Colonoscopy, which looks at your colon/large bowel through a flexible telescope-like instrument.   Biopsies, which remove a small sample of tissue to examine under a microscope.  Finding out the results of your test Not all test results are available during your visit. If your test results are not back during the visit, make an appointment with your caregiver to find out the results. Do not assume everything is normal if you have not heard from your caregiver or the medical facility. It is important for you to follow up on all of your test results. HOME CARE INSTRUCTIONS   Follow instructions as suggested by your caregiver regarding medicines. Do not take aspirin, drink alcohol, or take medicines for pain and arthritis unless your caregiver says it is okay.   Get the suggested follow-up care when the tests are done.  SEEK IMMEDIATE MEDICAL CARE IF:   Your bleeding increases or you become lightheaded, weak, or pass out (faint).   You experience severe cramps in your stomach, back, or belly (abdomen).    You pass large clots.   The problems which brought you in for medical care get worse.  MAKE SURE YOU:   Understand these instructions.   Will watch your condition.   Will get help right away if you are not doing well or get worse.  Document Released: 08/06/2000 Document Revised: 07/29/2011 Document Reviewed: 07/19/2011 [facility] Patient Information 2012 [place], [place].

## 2011-12-08 ENCOUNTER — Other Ambulatory Visit: Payer: Self-pay | Admitting: Gastroenterology

## 2011-12-08 ENCOUNTER — Encounter: Payer: Self-pay | Admitting: Urgent Care

## 2011-12-08 ENCOUNTER — Other Ambulatory Visit: Payer: Self-pay | Admitting: Urgent Care

## 2011-12-08 DIAGNOSIS — D649 Anemia, unspecified: Secondary | ICD-10-CM

## 2011-12-08 DIAGNOSIS — K922 Gastrointestinal hemorrhage, unspecified: Secondary | ICD-10-CM

## 2011-12-08 LAB — COMPREHENSIVE METABOLIC PANEL
ALT: 11 U/L (ref 0–53)
AST: 38 U/L — ABNORMAL HIGH (ref 0–37)
Albumin: 2.9 g/dL — ABNORMAL LOW (ref 3.5–5.2)
CO2: 25 mEq/L (ref 19–32)
Calcium: 8.4 mg/dL (ref 8.4–10.5)
Chloride: 107 mEq/L (ref 96–112)
Potassium: 4 mEq/L (ref 3.5–5.3)
Sodium: 139 mEq/L (ref 135–145)
Total Protein: 5.9 g/dL — ABNORMAL LOW (ref 6.0–8.3)

## 2011-12-08 LAB — CBC WITH DIFFERENTIAL/PLATELET
Basophils Absolute: 0 10*3/uL (ref 0.0–0.1)
Eosinophils Relative: 4 % (ref 0–5)
Lymphocytes Relative: 50 % — ABNORMAL HIGH (ref 12–46)
Lymphs Abs: 2.2 10*3/uL (ref 0.7–4.0)
MCV: 98.9 fL (ref 78.0–100.0)
Neutro Abs: 1.5 10*3/uL — ABNORMAL LOW (ref 1.7–7.7)
Neutrophils Relative %: 34 % — ABNORMAL LOW (ref 43–77)
Platelets: 103 10*3/uL — ABNORMAL LOW (ref 150–400)
RBC: 2.62 MIL/uL — ABNORMAL LOW (ref 4.22–5.81)
RDW: 14.2 % (ref 11.5–15.5)
WBC: 4.3 10*3/uL (ref 4.0–10.5)

## 2011-12-08 LAB — AFP TUMOR MARKER: AFP-Tumor Marker: 4.8 ng/mL (ref 0.0–8.0)

## 2011-12-08 LAB — HEPATITIS B SURFACE ANTIBODY,QUALITATIVE: Hep B S Ab: NEGATIVE

## 2011-12-08 MED ORDER — PEG-KCL-NACL-NASULF-NA ASC-C 100 G PO SOLR: 1.0000 | Freq: Once | ORAL | Status: DC

## 2011-12-08 NOTE — Assessment & Plan Note (Signed)
Suspected Nash cirrhosis (AMA positive).  History of thrombocytopenia, portal gastropathy, and hepatic encephalopathy.  He has been lost in followup since last seen June 2012.  Unsure of his vaccination status regarding hepatitis A and B. He is due for [facility] screening with ultrasound and AFP.  INR, CMP, hepatitis a total antibody, hepatitis B surface antibody

## 2011-12-08 NOTE — Progress Notes (Signed)
Quick Note:  Pt aware. Crystal has pt scheduled for 12/15/11, to be in endo at 1:40. She has mailed instructions. They are aware of where to go. Lab order faxed to lab. ______

## 2011-12-08 NOTE — Assessment & Plan Note (Signed)
Refractory heartburn despite daily omeprazole.  Pending EGD.

## 2011-12-08 NOTE — Progress Notes (Signed)
Quick Note:  Will discuss w/ RMR. XL:[id number],UVOZDG, NP  ______

## 2011-12-08 NOTE — Progress Notes (Signed)
Discuss with Dr. Gala Romney. He would like Phenergan 25 mg IV given 30 minutes prior to the procedure.

## 2011-12-08 NOTE — Progress Notes (Signed)
Results Cc to PCP  

## 2011-12-08 NOTE — Progress Notes (Signed)
Quick Note:  Needs EGD and colonoscopy with Dr. Gala Romney as soon as possible reason: GI bleed/anemia/cirrhosis. We discussed that office visit. Please arrange. Discussed with Dr. Gala Romney Please let the patient know his hemoglobin is low, but stable. Liver tests mildly elevated and platelets low that goes along with cirrhosis All other lab work looks ok. Recheck H&H Monday He will need hepatitis A and B. vaccines through his PCP or the health department Thanks  ______

## 2011-12-08 NOTE — Assessment & Plan Note (Addendum)
[name] [name] is a pleasant 51 y.o. male with [name] cirrhosis who presents with significant anemia (hgb 8.4) and Hemoccult-positive stools. He has had some "episodes of spitting up blood" as well as epistaxis. He has known portal gastropathyand thrombocytopenia due to his liver disease. Last evaluation of his upper GI tract showed no evidence of varices, however this has been 10 months ago. He believes his last colonoscopy was over 10 years ago.  He will need reevaluation with EGD to rule out varices or gastritis as the cause of his bleeding. He should also have colonoscopy in the near future.I have discussed risks & benefits which include, but are not limited to, bleeding, infection, perforation & drug reaction.  The patient agrees with this plan & written consent will be obtained.    Recheck CBC today to verify stability. Although his hemoglobin is 8.4, he is asymptomatic other than fatigue and is tolerating anemia well. He was advised to avoid aspirin products and NSAIDs.

## 2011-12-08 NOTE — Progress Notes (Signed)
Faxed to PCP

## 2011-12-09 ENCOUNTER — Telehealth: Payer: Self-pay | Admitting: Gastroenterology

## 2011-12-09 NOTE — Telephone Encounter (Signed)
Melanie called  pts procedure time moved up- I called pt he is aware to check in at 12:45

## 2011-12-13 ENCOUNTER — Other Ambulatory Visit: Payer: Self-pay | Admitting: Urgent Care

## 2011-12-14 ENCOUNTER — Encounter (HOSPITAL_COMMUNITY): Payer: Self-pay | Admitting: Pharmacy Technician

## 2011-12-14 ENCOUNTER — Telehealth: Payer: Self-pay | Admitting: Urgent Care

## 2011-12-14 ENCOUNTER — Ambulatory Visit (HOSPITAL_COMMUNITY)
Admission: RE | Admit: 2011-12-14 | Discharge: 2011-12-14 | Disposition: A | Discharge status: Home / Self Care | Payer: Medicare Other | Source: Ambulatory Visit | Attending: Urgent Care | Admitting: Urgent Care

## 2011-12-14 DIAGNOSIS — K7460 Unspecified cirrhosis of liver: Secondary | ICD-10-CM

## 2011-12-14 DIAGNOSIS — K8020 Calculus of gallbladder without cholecystitis without obstruction: Secondary | ICD-10-CM | POA: Insufficient documentation

## 2011-12-14 LAB — HEMOGLOBIN AND HEMATOCRIT, BLOOD: Hemoglobin: 8.9 g/dL — AB (ref 13.5–17.5)

## 2011-12-14 MED ORDER — SODIUM CHLORIDE 0.45 % IV SOLN
Freq: Once | INTRAVENOUS | Status: AC
  Administered 2011-12-15: 13:00:00 via INTRAVENOUS

## 2011-12-14 NOTE — Telephone Encounter (Signed)
Did pt get CBC?

## 2011-12-14 NOTE — Progress Notes (Signed)
Quick Note:  pts wife aware. Please cc pcp. ______

## 2011-12-14 NOTE — Telephone Encounter (Signed)
Yes, it has been abstracted.

## 2011-12-14 NOTE — Progress Notes (Signed)
Quick Note:  Please let patient Hgb improved. Pt scheduled for procedures tomorrow. CC: DANIEL,JANICE, NP  ______

## 2011-12-14 NOTE — Progress Notes (Signed)
REVIEWED.  Discuss w/ rmr.

## 2011-12-14 NOTE — Progress Notes (Signed)
Cc to PCP 

## 2011-12-14 NOTE — Progress Notes (Signed)
Quick Note:  Correction:  Pt has outpatient EGD/colonoscopy tomorrow w/ Dr Gala Romney. Please call pt to let him know-liver stable w/ cirrhosis/gallstones(asymptomatic) No new findings. Thanks CC: DANIEL,JANICE, NP  ______

## 2011-12-14 NOTE — Progress Notes (Signed)
Faxed to PCP

## 2011-12-15 ENCOUNTER — Other Ambulatory Visit: Payer: Self-pay | Admitting: Gastroenterology

## 2011-12-15 ENCOUNTER — Ambulatory Visit: Admit: 2011-12-15 | Payer: Self-pay | Admitting: Internal Medicine

## 2011-12-15 ENCOUNTER — Encounter (HOSPITAL_COMMUNITY): Payer: Self-pay | Admitting: *Deleted

## 2011-12-15 ENCOUNTER — Ambulatory Visit (HOSPITAL_COMMUNITY)
Admission: RE | Admit: 2011-12-15 | Discharge: 2011-12-15 | Disposition: A | Discharge status: Home / Self Care | Payer: Medicare Other | Source: Ambulatory Visit | Attending: Internal Medicine | Admitting: Internal Medicine

## 2011-12-15 ENCOUNTER — Encounter (HOSPITAL_COMMUNITY)
Admission: RE | Disposition: A | Discharge status: Home / Self Care | Payer: Self-pay | Source: Ambulatory Visit | Attending: Internal Medicine

## 2011-12-15 ENCOUNTER — Telehealth: Payer: Self-pay | Admitting: Internal Medicine

## 2011-12-15 DIAGNOSIS — D649 Anemia, unspecified: Secondary | ICD-10-CM

## 2011-12-15 DIAGNOSIS — K8020 Calculus of gallbladder without cholecystitis without obstruction: Secondary | ICD-10-CM | POA: Insufficient documentation

## 2011-12-15 DIAGNOSIS — D509 Iron deficiency anemia, unspecified: Secondary | ICD-10-CM | POA: Insufficient documentation

## 2011-12-15 DIAGNOSIS — E119 Type 2 diabetes mellitus without complications: Secondary | ICD-10-CM | POA: Insufficient documentation

## 2011-12-15 DIAGNOSIS — E785 Hyperlipidemia, unspecified: Secondary | ICD-10-CM | POA: Insufficient documentation

## 2011-12-15 DIAGNOSIS — N269 Renal sclerosis, unspecified: Secondary | ICD-10-CM | POA: Insufficient documentation

## 2011-12-15 DIAGNOSIS — I10 Essential (primary) hypertension: Secondary | ICD-10-CM | POA: Insufficient documentation

## 2011-12-15 DIAGNOSIS — K5730 Diverticulosis of large intestine without perforation or abscess without bleeding: Secondary | ICD-10-CM

## 2011-12-15 DIAGNOSIS — Z79899 Other long term (current) drug therapy: Secondary | ICD-10-CM | POA: Insufficient documentation

## 2011-12-15 DIAGNOSIS — K2960 Other gastritis without bleeding: Secondary | ICD-10-CM

## 2011-12-15 DIAGNOSIS — K2940 Chronic atrophic gastritis without bleeding: Secondary | ICD-10-CM | POA: Insufficient documentation

## 2011-12-15 DIAGNOSIS — K922 Gastrointestinal hemorrhage, unspecified: Secondary | ICD-10-CM

## 2011-12-15 HISTORY — PX: ESOPHAGOGASTRODUODENOSCOPY: SHX1529

## 2011-12-15 HISTORY — PX: COLONOSCOPY: SHX174

## 2011-12-15 SURGERY — COLONOSCOPY WITH ESOPHAGOGASTRODUODENOSCOPY (EGD)
Anesthesia: Moderate Sedation

## 2011-12-15 MED ORDER — MEPERIDINE HCL 100 MG/ML IJ SOLN
INTRAMUSCULAR | Status: AC
  Filled 2011-12-15: qty 1

## 2011-12-15 MED ORDER — MIDAZOLAM HCL 5 MG/5ML IJ SOLN
INTRAMUSCULAR | Status: DC | PRN
  Administered 2011-12-15: 1 mg via INTRAVENOUS

## 2011-12-15 MED ORDER — STERILE WATER FOR IRRIGATION IR SOLN
Status: DC | PRN
  Administered 2011-12-15: 14:00:00

## 2011-12-15 MED ORDER — SODIUM CHLORIDE 0.9 % IJ SOLN
INTRAMUSCULAR | Status: AC
  Filled 2011-12-15: qty 10

## 2011-12-15 MED ORDER — MIDAZOLAM HCL 5 MG/5ML IJ SOLN
INTRAMUSCULAR | Status: DC | PRN
  Administered 2011-12-15: 2 mg via INTRAVENOUS
  Administered 2011-12-15: 1 mg via INTRAVENOUS
  Administered 2011-12-15: 2 mg via INTRAVENOUS

## 2011-12-15 MED ORDER — PROMETHAZINE HCL 25 MG/ML IJ SOLN
25.0000 mg | Freq: Once | INTRAMUSCULAR | Status: AC
  Administered 2011-12-15: 25 mg via INTRAVENOUS

## 2011-12-15 MED ORDER — PROMETHAZINE HCL 25 MG/ML IJ SOLN
INTRAMUSCULAR | Status: AC
  Filled 2011-12-15: qty 1

## 2011-12-15 MED ORDER — MIDAZOLAM HCL 5 MG/5ML IJ SOLN
INTRAMUSCULAR | Status: AC
  Filled 2011-12-15: qty 10

## 2011-12-15 MED ORDER — MEPERIDINE HCL 100 MG/ML IJ SOLN
INTRAMUSCULAR | Status: DC | PRN
  Administered 2011-12-15 (×2): 50 mg via INTRAVENOUS

## 2011-12-15 NOTE — Telephone Encounter (Signed)
Pam from short stay called to let us know that patient had a poor prep and RMR wants to [facility] him and to have air contrast BE. Pam asked if we were going to call patient and I said that we would call patient.

## 2011-12-15 NOTE — Op Note (Signed)
[facility] [address] [place], [place]  [address]  ENDOSCOPY PROCEDURE REPORT  PATIENT:  [name], [name]  MR#:  [id number] BIRTHDATE:  12/13/1960, 50 yrs. old  GENDER:  male  ENDOSCOPIST:  R. Garfield Cornea, MD [facility] Referred by:          Carolee Rota, NP  PROCEDURE DATE:  12/15/2011 PROCEDURE:  EGD with gastric biopsy  INDICATIONS:   anemia; GI bleed. History of cirrhosis. History of epistaxis  INFORMED CONSENT:   The risks, benefits, limitations, alternatives and imponderables have been discussed.  The potential for biopsy, esophogeal dilation, etc. have also been reviewed.  Questions have been answered.  All parties agreeable.  Please see the history and physical in the medical record for more information.  MEDICATIONS:     Versed 4 mg IV and Demerol 100 mg doses. Cetacaine spray  DESCRIPTION OF PROCEDURE:   The OV-[id number] ([id number]) endoscope was introduced through the mouth and advanced to the second portion of the duodenum without difficulty or limitations.  The mucosal surfaces were surveyed very carefully during advancement of the scope and upon withdrawal.  Retroflexion view of the proximal stomach and esophagogastric junction was performed.  <<PROCEDUREIMAGES>>  FINDINGS:  Normal esophagus. No esophageal varices. Stomach empty; minimal snake skinning of the gastric mucosa consistent with portal gastropathy; antral erosions. No ulcer or infiltrating process. Pylorus patent ;   examination of the bulb and second     portion of the duodenum demonstrated bulbar erosions  THERAPEUTIC / DIAGNOSTIC MANEUVERS PERFORMED: Gastric biopsies taken for histologic study.  COMPLICATIONS:   None  IMPRESSION: Normal esophagus. Mild changes of portal gastropathy. Gastric and duodenal erosions status post biopsy  RECOMMENDATIONS: See colonoscopy report.  ______________________________ R. Garfield Cornea, MD [name]  CC:  n. eSIGNED:   R. Garfield Cornea at  12/15/2011 01:50 PM  [name], [phone]

## 2011-12-15 NOTE — Op Note (Signed)
[facility] [address] [place], [place]  [address]  COLONOSCOPY PROCEDURE REPORT  PATIENT:  [name], [name]  MR#:  [id number] BIRTHDATE:  [birth year], 50 yrs. old  GENDER:  male ENDOSCOPIST:  R. Garfield Cornea, MD FACP [facility] REF. BY:          Carolee Rota, nurse practitioner PROCEDURE DATE:  12/15/2011 PROCEDURE:  Diagnostic colonoscopy  INDICATIONS:  Anemia/GI bleed. Last colonoscopy reportedly in [place]:  The risks, benefits, alternatives and imponderables including but not limited to bleeding, perforation as well as the possibility of a missed lesion have been reviewed. The potential for biopsy, lesion removal, etc. have also been discussed.  Questions have been answered.  All parties agreeable. Please see the history and physical in the medical record for more information.  MEDICATIONS:  Versed 6 mg IV and Demerol 100 mg IV in divided doses.  DESCRIPTION OF PROCEDURE:  After a digital rectal exam was performed, the EC-[id number] ([id number]) colonoscope was advanced from the anus through the rectum and colon to the area of the cecum, ileocecal valve and appendiceal orifice.  The cecum was deeply intubated.  These structures were well-seen and photographed for the record.  From the level of the cecum and ileocecal valve, the scope was slowly and cautiously withdrawn.  The mucosal surfaces were carefully surveyed utilizing scope tip deflection to facilitate fold flattening as needed.  The scope was pulled down into the rectum where a thorough examination including retroflexion was performed. <<PROCEDUREIMAGES>>  FINDINGS: Poor preparation severely compromised examination. Grossly normal rectum. Left-sided diverticula, the portions of the colon to the cecum that were seen appeared normal however, a semi-formed stool and vegetable matter grossly prevented complete imaging of all the colonic mucosa appears significant lesion could have been obscured today  by the poor prep. Has been an extended amount of time going in trying to clean up however the scope kept getting clogged up.  THERAPEUTIC / DIAGNOSTIC MANEUVERS PERFORMED: None  COMPLICATIONS:  None  CECAL WITHDRAWAL TIME: 7 minutes  IMPRESSION: Colonic diverticulosis; poor preparation compromised the exam. I consider today's examination inadequate as far as examination of his colon is concerned. RECOMMENDATIONS:  Air-contrast barium enma to image colon not seen today:-Particularly the right side. Would also consider the possibility of  spurious GI bleeding in the setting of epistaxis (swallowing blood) in this clinical setting. Further recommendations to follow. See EGD report.  ______________________________ R. Garfield Cornea, MD [name]  CC:  n. eSIGNED:   R. Legrand Como Roseanne Juenger at 12/15/2011 02:17 PM  [name],   [phone]

## 2011-12-15 NOTE — H&P (View-Only) (Signed)
Primary Care Physician:  Carolee Rota, NP, NP Primary Gastroenterologist:  Dr. Gala Romney  Chief Complaint  Patient presents with  . low HGB   HPI:  [name] [name] is a 51 y.o. male here for further management of anemia. We initially met Mr. Liberati back in June of last year when he was admitted for hepatic encephalopathy. 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 He had a positive AMA, negative ANA, negative viral hepatitis markers, negative ceruloplasmin and 24-hour urinary copper, and negative (high) alpha-1 antitrypsin.  He had a normal AFP, CT of his abdomen and pelvis which showed cirrhosis, portal venous hypertension with moderate splenomegaly, spontaneous splenorenal shunt, mesenteric varices, cholelithiasis, and nonspecific gallbladder wall thickening up to 6 mm w/may be due to the hepatocellular disease.  He also had an EGD by Dr. Gala Romney on 02/02/11 where  have mild portal gastropathy and no evidence of esophageal varices. After discharge, he did not return for followup.  He was sent back to [place] for evaluation of anemia today.  He had been complaining of fatigue and was seen by Carolee Rota, NP & was found to have a hemoglobin of 8.4 on 11/29/11 with a platelet count of 88. Four months ago, his hemoglobin was 13.1.  He denies any rectal bleeding or melena. He does admit to "spitting up blood"on several occasions. He had an episode of epistaxis yesterday.  He does take Baby ASA daily, as well as Advil arthritis when necessary.  Gives Hx of always being a "free bleeder."  C/o fatigue & malaise.  Denies any chest pain, shortness breath, or palpitations.  He is daily not taking lactulose daily, just prn because he was haqving too many stools.  No further episodes of confusion. He does take an occasional Vicodin for pain.  c/o mid-abdominal pain,  that is mild and intermittent. He does have daily heartburn and indigestion. He is on Prilosec  20 mg daily. By our records, his weight is down 18 kg in the past 10 months.    Past Medical History  Diagnosis Date  . Depression   . 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 Bipolar 1 disorder   . Hypertension   . Diabetes mellitus   . Seizures   . COPD (chronic obstructive pulmonary disease)   . Mental retardation   . Blind right eye   . GERD (gastroesophageal reflux disease)   . Chronic back pain   . Hepatic encephalopathy   . Anemia   . Thrombocytopenia   . Cardiomegaly   . Anxiety   . Arthritis   . Cholelithiasis     Asymptomatic  . Cerebrovascular disease     Right vertebral artery  . Hard of hearing   . Hyperlipidemia   . Cataract     Right eye    Past Surgical History  Procedure Date  . Tonsillectomy   . Ear operations   . Cast application 08/29/9148    Procedure: MINOR CAST APPLICATION;  Surgeon: [name], MD;  Location: [facility];  Service: Orthopedics;  Laterality: Right;  no anesthesia , procedure room do not need or room !  . Orif ankle fracture 08/27/2011    Procedure: OPEN REDUCTION INTERNAL FIXATION (ORIF) ANKLE FRACTURE;  Surgeon: [name], MD;  Location: [facility];  Service: Orthopedics;  Laterality: Right;  . Esophagogastroduodenoscopy 02/02/2011    Rourk-Normal esophagus.  No varices/Question mild portal gastropathy, extrinsic compression on the antrum, lesser curvature  of uncertain significance, some minimally  nodular mucosa status post biopsy, patent pylorus, normal duodenum 1 and duodenum 2.    Current Outpatient Prescriptions  Medication Sig Dispense Refill  . ALPRAZolam (XANAX) 0.5 MG tablet Take 0.5 mg by mouth 3 (three) times daily.        Marland Kitchen ezetimibe (ZETIA) 10 MG tablet Take 10 mg by mouth daily.        Marland Kitchen FLUoxetine (PROZAC) 20 MG capsule Take 20 mg by mouth daily.        . furosemide (LASIX) 20 MG tablet Take 20 mg by mouth daily.        Marland Kitchen HYDROcodone-acetaminophen (NORCO) 7.5-325 MG per tablet Take 1 tablet by mouth every 4 (four) hours  as needed.      . lactulose (CHRONULAC) 10 GM/15ML solution Take 20 g by mouth 3 (three) times daily.        Marland Kitchen levETIRAcetam (KEPPRA) 500 MG tablet Take 500 mg by mouth 2 (two) times daily.        . metoprolol (LOPRESSOR) 50 MG tablet Take 50 mg by mouth 3 (three) times daily.        Marland Kitchen omeprazole (PRILOSEC) 20 MG capsule Take 20 mg by mouth daily.        . potassium chloride SA (K-DUR,KLOR-CON) 20 MEQ tablet Take 20 mEq by mouth daily.        . rifaximin (XIFAXAN) 200 MG tablet Take 550 mg by mouth 2 (two) times daily.          Allergies as of 12/07/2011 - Review Complete 12/07/2011  Allergen Reaction Noted  . Penicillins Rash 02/02/2011    Family History  Problem Relation Age of Onset  . Heart failure Mother   . Thyroid disease Mother   . Cancer Father   . Asthma Brother   . Heart attack Brother   . Cirrhosis Brother     EtOH    History   Social History  . Marital Status: Single    Spouse Name: N/A    Number of Children: 0  . Years of Education: N/A   Occupational History  . disabled    Social History Main Topics  . Smoking status: Current Everyday Smoker -- 1.0 packs/day for 30 years    Types: Cigarettes  . Smokeless tobacco: Not on file  . Alcohol Use: No  . Drug Use: No  . Sexually Active: Not on file  Review of Systems: Gen:  see history of present illness  CV: Denies chest pain, angina, palpitations, syncope, orthopnea, PND, peripheral edema, and claudication. Resp: Denies dyspnea at rest, dyspnea with exercise, cough, sputum, wheezing, coughing up blood, and pleurisy. GI: Denies vomiting blood, jaundice, and fecal incontinence.   Denies dysphagia or odynophagia. GU : Denies urinary burning, blood in urine, urinary frequency, urinary hesitancy, nocturnal urination, and urinary incontinence. MS:  chronic low back and joint pain and stiffness. History of arthritis.  Derm: Denies rash, itching, dry skin, hives, moles, warts, or unhealing ulcers.  Psych: Denies  memory loss, suicidal ideation, hallucinations, paranoia, and confusion. Heme:  +easy bruising. Neuro:  Denies any headaches, dizziness, paresthesias. Endo:  Denies any problems with DM, thyroid, adrenal function.  Physical Exam: BP 131/67  Pulse 71  Temp(Src) 98.7 F (37.1 C) (Temporal)  Ht 5' 7"  (1.702 m)  Wt 218 lb (98.884 kg)  BMI 34.14 kg/m2 General:   Alert, obese, pleasant and cooperative in NAD.  Accompanied By his wife today. Head:  Normocephalic and atraumatic.  Eyes:  Right sclera cloudy.  no icterus.   Conjunctiva pink. Ears: Hard of hearing Nose:  No deformity, discharge, or lesions. Mouth: duplicate uvula. No lesions,oropharynx pink & moist. Neck:  Supple; no masses or thyromegaly. Lungs:  Clear throughout to auscultation.   No wheezes, crackles, or rhonchi. No acute distress. Heart:  Regular rate and rhythm; no murmurs, clicks, rubs,  or gallops. Abdomen:  Normal bowel sounds.  No bruits.  Soft, non-tender and non-distended.  no tense ascites. Liver is palpable 4 fingerbreadths below the right costal margin. Positive splenomegaly. No guarding or rebound tenderness.   Rectal:   small amount dark brown stool obtained from vault which was Hemoccult positive.no internal/external masses palpated.  Msk:  Symmetrical without gross deformities. Normal posture. Pulses:  Normal pulses noted. Extremities: + Clubbing. No asterixis. Right lower leg in a hard cast and boot. He has 1+ pitting edema bilaterally.  Neurologic:  Alert and  oriented x4;   mentally challenged. At his baseline.  Skin:  Intact without significant lesions or rashes. Lymph Nodes:  No significant cervical adenopathy.

## 2011-12-15 NOTE — Interval H&P Note (Signed)
History and Physical Interval Note:  12/15/2011 1:28 PM  [name] [name]  has presented today for surgery, with the diagnosis of Anemia and GI Bleed  The various methods of treatment have been discussed with the patient and family. After consideration of risks, benefits and other options for treatment, the patient has consented to  Procedure(s) (LRB): COLONOSCOPY WITH ESOPHAGOGASTRODUODENOSCOPY (EGD) (N/A) as a surgical intervention .  The patients' history has been reviewed, patient examined, no change in status, stable for surgery.  I have reviewed the patients' chart and labs.  Questions were answered to the patient's satisfaction.     Manus Rudd

## 2011-12-15 NOTE — Telephone Encounter (Signed)
Pt is scheduled for Air Contrast BE on 04/29 @ 9- LMOVM for pt to call me back

## 2011-12-15 NOTE — Discharge Instructions (Addendum)
EGD Discharge instructions Please read the instructions outlined below and refer to this sheet in the next few weeks. These discharge instructions provide you with general information on caring for yourself after you leave the hospital. Your doctor may also give you specific instructions. While your treatment has been planned according to the most current medical practices available, unavoidable complications occasionally occur. If you have any problems or questions after discharge, please call your doctor. ACTIVITY  You may resume your regular activity but move at a slower pace for the next 24 hours.   Take frequent rest periods for the next 24 hours.   Walking will help expel (get rid of) the air and reduce the bloated feeling in your abdomen.   No driving for 24 hours (because of the anesthesia (medicine) used during the test).   You may shower.   Do not sign any important legal documents or operate any machinery for 24 hours (because of the anesthesia used during the test).  NUTRITION  Drink plenty of fluids.   You may resume your normal diet.   Begin with a light meal and progress to your normal diet.   Avoid alcoholic beverages for 24 hours or as instructed by your caregiver.  MEDICATIONS  You may resume your normal medications unless your caregiver tells you otherwise.  WHAT YOU CAN EXPECT TODAY  You may experience abdominal discomfort such as a feeling of fullness or "gas" pains.  FOLLOW-UP  Your doctor will discuss the results of your test with you.  SEEK IMMEDIATE MEDICAL ATTENTION IF ANY OF THE FOLLOWING OCCUR:  Excessive nausea (feeling sick to your stomach) and/or vomiting.   Severe abdominal pain and distention (swelling).   Trouble swallowing.   Temperature over 101 F (37.8 C).   Rectal bleeding or vomiting of blood.     Colonoscopy Discharge Instructions  Read the instructions outlined below and refer to this sheet in the next few weeks. These  discharge instructions provide you with general information on caring for yourself after you leave the hospital. Your doctor may also give you specific instructions. While your treatment has been planned according to the most current medical practices available, unavoidable complications occasionally occur. If you have any problems or questions after discharge, call Dr. Gala Romney at [phone]. ACTIVITY  You may resume your regular activity, but move at a slower pace for the next 24 hours.   Take frequent rest periods for the next 24 hours.   Walking will help get rid of the air and reduce the bloated feeling in your belly (abdomen).   No driving for 24 hours (because of the medicine (anesthesia) used during the test).    Do not sign any important legal documents or operate any machinery for 24 hours (because of the anesthesia used during the test).  NUTRITION  Drink plenty of fluids.   You may resume your normal diet as instructed by your doctor.   Begin with a light meal and progress to your normal diet. Heavy or fried foods are harder to digest and may make you feel sick to your stomach (nauseated).   Avoid alcoholic beverages for 24 hours or as instructed.  MEDICATIONS  You may resume your normal medications unless your doctor tells you otherwise.  WHAT YOU CAN EXPECT TODAY  Some feelings of bloating in the abdomen.   Passage of more gas than usual.   Spotting of blood in your stool or on the toilet paper.  IF YOU HAD POLYPS REMOVED DURING  THE COLONOSCOPY:  No aspirin products for 7 days or as instructed.   No alcohol for 7 days or as instructed.   Eat a soft diet for the next 24 hours.  FINDING OUT THE RESULTS OF YOUR TEST Not all test results are available during your visit. If your test results are not back during the visit, make an appointment with your caregiver to find out the results. Do not assume everything is normal if you have not heard from your caregiver or the  medical facility. It is important for you to follow up on all of your test results.  SEEK IMMEDIATE MEDICAL ATTENTION IF:  You have more than a spotting of blood in your stool.   Your belly is swollen (abdominal distention).   You are nauseated or vomiting.   You have a temperature over 101.   You have abdominal pain or discomfort that is severe or gets worse throughout the day.    Stop aspirin and Advil for now.  Air contrast barium enema to image the colon not seen today because of the poor preparation.   Diverticulosis information provided.  Further recommendations to follow pending review of pathology report.   Diverticulosis Diverticulosis is a common condition that develops when small pouches (diverticula) form in the wall of the colon. The risk of diverticulosis increases with age. It happens more often in people who eat a low-fiber diet. Most individuals with diverticulosis have no symptoms. Those individuals with symptoms usually experience abdominal pain, constipation, or loose stools (diarrhea). HOME CARE INSTRUCTIONS  Increase the amount of fiber in your diet as directed by your caregiver or dietician. This may reduce symptoms of diverticulosis.  Your caregiver may recommend taking a dietary fiber supplement.  Drink at least 6 to 8 glasses of water each day to prevent constipation.  Try not to strain when you have a bowel movement.  Your caregiver may recommend avoiding nuts and seeds to prevent complications, although this is still an uncertain benefit.  Only take over-the-counter or prescription medicines for pain, discomfort, or fever as directed by your caregiver.  FOODS WITH HIGH FIBER CONTENT INCLUDE: Fruits. Apple, peach, pear, tangerine, raisins, prunes.  Vegetables. Brussels sprouts, asparagus, broccoli, cabbage, carrot, cauliflower, romaine lettuce, spinach, summer squash, tomato, winter squash, zucchini.  Starchy Vegetables. Baked beans, kidney beans, lima  beans, split peas, lentils, potatoes (with skin).  Grains. Whole wheat bread, brown rice, bran flake cereal, plain oatmeal, white rice, shredded wheat, bran muffins.  SEEK IMMEDIATE MEDICAL CARE IF:  You develop increasing pain or severe bloating.  You have an oral temperature above 102 F (38.9 C), not controlled by medicine.  You develop vomiting or bowel movements that are bloody or black.  Document Released: 05/06/2004 Document Revised: 07/29/2011 Document Reviewed: 01/07/2010 [facility] Patient Information 2012 [place].Colonoscopy Care After Read the instructions outlined below and refer to this sheet in the next few weeks. These discharge instructions provide you with general information on caring for yourself after you leave the hospital. Your doctor may also give you specific instructions. While your treatment has been planned according to the most current medical practices available, unavoidable complications occasionally occur. If you have any problems or questions after discharge, call your doctor. HOME CARE INSTRUCTIONS ACTIVITY: You may resume your regular activity, but move at a slower pace for the next 24 hours.  Take frequent rest periods for the next 24 hours.  Walking will help get rid of the air and reduce the bloated feeling in  your belly (abdomen).  No driving for 24 hours (because of the medicine (anesthesia) used during the test).  You may shower.  Do not sign any important legal documents or operate any machinery for 24 hours (because of the anesthesia used during the test).  NUTRITION: Drink plenty of fluids.  You may resume your normal diet as instructed by your doctor.  Begin with a light meal and progress to your normal diet. Heavy or fried foods are harder to digest and may make you feel sick to your stomach (nauseated).  Avoid alcoholic beverages for 24 hours or as instructed.  MEDICATIONS: You may resume your normal medications unless your doctor tells  you otherwise.  WHAT TO EXPECT TODAY: Some feelings of bloating in the abdomen.  Passage of more gas than usual.  Spotting of blood in your stool or on the toilet paper.  IF YOU HAD POLYPS REMOVED DURING THE COLONOSCOPY: No aspirin products for 7 days or as instructed.  No alcohol for 7 days or as instructed.  Eat a soft diet for the next 24 hours.  FINDING OUT THE RESULTS OF YOUR TEST Not all test results are available during your visit. If your test results are not back during the visit, make an appointment with your caregiver to find out the results. Do not assume everything is normal if you have not heard from your caregiver or the medical facility. It is important for you to follow up on all of your test results.  SEEK IMMEDIATE MEDICAL CARE IF: You have more than a spotting of blood in your stool.  Your belly is swollen (abdominal distention).  You are nauseated or vomiting.  You have a fever.  You have abdominal pain or discomfort that is severe or gets worse throughout the day.  Document Released: 03/23/2004 Document Revised: 07/29/2011 Document Reviewed: 03/21/2008 [name] Patient Information 2012 [place].

## 2011-12-18 ENCOUNTER — Encounter: Payer: Self-pay | Admitting: Internal Medicine

## 2011-12-20 ENCOUNTER — Ambulatory Visit (INDEPENDENT_AMBULATORY_CARE_PROVIDER_SITE_OTHER): Payer: Medicare Other | Admitting: Orthopedic Surgery

## 2011-12-20 ENCOUNTER — Ambulatory Visit (HOSPITAL_COMMUNITY): Payer: Medicare Other

## 2011-12-20 ENCOUNTER — Ambulatory Visit (INDEPENDENT_AMBULATORY_CARE_PROVIDER_SITE_OTHER): Payer: Medicare Other

## 2011-12-20 ENCOUNTER — Encounter: Payer: Self-pay | Admitting: Orthopedic Surgery

## 2011-12-20 VITALS — BP 120/70 | Ht 66.0 in | Wt 222.0 lb

## 2011-12-20 DIAGNOSIS — S82891A Other fracture of right lower leg, initial encounter for closed fracture: Secondary | ICD-10-CM

## 2011-12-20 DIAGNOSIS — S82899A Other fracture of unspecified lower leg, initial encounter for closed fracture: Secondary | ICD-10-CM

## 2011-12-20 DIAGNOSIS — S82843A Displaced bimalleolar fracture of unspecified lower leg, initial encounter for closed fracture: Secondary | ICD-10-CM

## 2011-12-20 NOTE — Patient Instructions (Signed)
Wear ankle brace x 6 weeks

## 2011-12-20 NOTE — Progress Notes (Signed)
Patient ID: [name] [name], male   DOB: 1961-01-15, 51 y.o.   MRN: [id number] Chief Complaint  Patient presents with  . Follow-up    4 week recheck and xray right ankle, DOS 08/25/11    BP 120/70  Ht 5' 6"  (1.676 m)  Wt 222 lb (100.699 kg)  BMI 35.83 kg/m2  The fracture is x-rayed again. 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 An x-ray shows that his fracture appears to have healed now. His mortise view is normal.  He's placed in an air cast at the six-week x-ray planned.

## 2011-12-21 ENCOUNTER — Encounter: Payer: Self-pay | Admitting: Internal Medicine

## 2012-02-02 ENCOUNTER — Ambulatory Visit (INDEPENDENT_AMBULATORY_CARE_PROVIDER_SITE_OTHER): Payer: Medicare Other

## 2012-02-02 ENCOUNTER — Ambulatory Visit (INDEPENDENT_AMBULATORY_CARE_PROVIDER_SITE_OTHER): Payer: Medicare Other | Admitting: Orthopedic Surgery

## 2012-02-02 ENCOUNTER — Encounter: Payer: Self-pay | Admitting: Orthopedic Surgery

## 2012-02-02 VITALS — BP 142/70 | Ht 66.0 in | Wt 222.0 lb

## 2012-02-02 DIAGNOSIS — S82899A Other fracture of unspecified lower leg, initial encounter for closed fracture: Secondary | ICD-10-CM

## 2012-02-02 MED ORDER — HYDROCODONE-ACETAMINOPHEN 5-500 MG PO TABS: 1.0000 | ORAL_TABLET | Freq: Four times a day (QID) | ORAL | Status: DC | PRN

## 2012-02-02 NOTE — Progress Notes (Signed)
Patient ID: [name] [name], male   DOB: 23-Dec-1960, 51 y.o.   MRN: [id number] Chief Complaint  Patient presents with  . 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 He has a history of venous stasis disease and has pain swelling and redness with tenderness in the leg. His ankle motion is about 10.  He was noncompliant could not keep his foot off the ground after surgery  He is at high risk he does this.  Recommend treatment of his venous stasis disease with Unna boot  Followup one month  Decrease activities use a walker to unload the foot.

## 2012-02-02 NOTE — Patient Instructions (Addendum)
See [facility] for leg wrap   Limit activity use walker

## 2012-03-07 ENCOUNTER — Ambulatory Visit: Payer: Medicare Other | Admitting: Orthopedic Surgery

## 2012-04-05 ENCOUNTER — Ambulatory Visit: Payer: Medicare Other | Admitting: Gastroenterology

## 2012-04-06 LAB — CBC WITH DIFFERENTIAL/PLATELET
MCV: 88 fL
WBC: 2.5
platelet count: 58

## 2012-04-06 LAB — COMPREHENSIVE METABOLIC PANEL
%SAT: 21
Alkaline Phosphatase: 112 U/L
Ammonia: 316
Creat: 15
Ferritin: 21 ng/mL (ref 18.0–300.0)
Glucose: 114
Potassium: 4.4 mmol/L
Sodium: 144 mmol/L (ref 137–147)
TIBC: 330

## 2012-04-12 ENCOUNTER — Other Ambulatory Visit: Payer: Self-pay | Admitting: Medical Oncology

## 2012-04-12 DIAGNOSIS — D696 Thrombocytopenia, unspecified: Secondary | ICD-10-CM

## 2012-04-13 ENCOUNTER — Telehealth: Payer: Self-pay | Admitting: General Practice

## 2012-04-13 NOTE — Telephone Encounter (Signed)
wife states that he will not come in tahat his blood has been the same x 3 and if he i

## 2012-04-26 ENCOUNTER — Encounter: Payer: Self-pay | Admitting: Orthopedic Surgery

## 2012-04-26 ENCOUNTER — Ambulatory Visit (INDEPENDENT_AMBULATORY_CARE_PROVIDER_SITE_OTHER): Payer: Medicare Other

## 2012-04-26 ENCOUNTER — Ambulatory Visit (INDEPENDENT_AMBULATORY_CARE_PROVIDER_SITE_OTHER): Payer: Medicare Other | Admitting: Orthopedic Surgery

## 2012-04-26 VITALS — BP 140/70 | Ht 66.0 in | Wt 227.0 lb

## 2012-04-26 DIAGNOSIS — S82843A Displaced bimalleolar fracture of unspecified lower leg, initial encounter for closed fracture: Secondary | ICD-10-CM

## 2012-04-26 DIAGNOSIS — M25579 Pain in unspecified ankle and joints of unspecified foot: Secondary | ICD-10-CM

## 2012-04-26 DIAGNOSIS — M19079 Primary osteoarthritis, unspecified ankle and foot: Secondary | ICD-10-CM | POA: Insufficient documentation

## 2012-04-26 DIAGNOSIS — M25571 Pain in right ankle and joints of right foot: Secondary | ICD-10-CM

## 2012-04-26 NOTE — Progress Notes (Signed)
Patient ID: [name] [name], male   DOB: [birth year], 50 y.o.   MRN: [id number] Chief Complaint  Patient presents with  . Follow-up    follow up Right ankle, DOS 09/08/11    Patient presents back after bimalleolar fracture treated with lateral plating and long-term casting complaining of continued ankle pain despite hydrocodone pain medication. The patient had a free fracture foot deformity with severe planovalgus external rotation deformity in addition to diabetic neuropathy. Current x-rays show that the hardware is failing. The fracture may or may not have healed. It is difficult to assess. He continues with foot deformity. He has swelling right foot and ankle with no sign of erythema.  Past Medical History  Diagnosis Date  . Depression   . 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 Bipolar 1 disorder   . Hypertension   . Diabetes mellitus   . Seizures   . COPD (chronic obstructive pulmonary disease)   . Mental retardation   . Blind right eye   . GERD (gastroesophageal reflux disease)   . Chronic back pain   . Hepatic encephalopathy   . Anemia   . Thrombocytopenia   . Cardiomegaly   . Anxiety   . Arthritis   . Cholelithiasis     Asymptomatic  . Cerebrovascular disease     Right vertebral artery  . Hard of hearing   . Hyperlipidemia   . Cataract     Right eye    Past Surgical History  Procedure Date  . Tonsillectomy   . Ear operations   . Cast application 0/01/3015    Procedure: MINOR CAST APPLICATION;  Surgeon: [name], MD;  Location: [facility];  Service: Orthopedics;  Laterality: Right;  no anesthesia , procedure room do not need or room !  . Orif ankle fracture 08/27/2011    Procedure: OPEN REDUCTION INTERNAL FIXATION (ORIF) ANKLE FRACTURE;  Surgeon: [name], MD;  Location: [facility];  Service: Orthopedics;  Laterality: Right;  . Esophagogastroduodenoscopy 02/02/2011    Rourk-Normal esophagus.  No varices/Question mild portal gastropathy, extrinsic compression on the  antrum, lesser curvature of uncertain significance, some minimally  nodular mucosa status post biopsy, patent pylorus, normal duodenum 1 and duodenum 2.    History  Substance Use Topics  . Smoking status: Current Everyday Smoker -- 1.0 packs/day for 30 years    Types: Cigarettes  . Smokeless tobacco: Not on file  . Alcohol Use: No   Current Outpatient Prescriptions on File Prior to Visit  Medication Sig Dispense Refill  . ALPRAZolam (XANAX) 0.5 MG tablet Take 0.5 mg by mouth 3 (three) times daily.        Marland Kitchen aspirin EC 81 MG tablet Take 81 mg by mouth daily.      . Cholecalciferol (VITAMIN D) 2000 UNITS tablet Take 2,000 Units by mouth daily.      Marland Kitchen ezetimibe (ZETIA) 10 MG tablet Take 10 mg by mouth daily.        Marland Kitchen FLUoxetine (PROZAC) 20 MG capsule Take 20 mg by mouth daily.        . furosemide (LASIX) 20 MG tablet Take 20 mg by mouth 2 (two) times daily.       Marland Kitchen HYDROcodone-acetaminophen (VICODIN) 5-500 MG per tablet Take 1 tablet by mouth every 6 (six) hours as needed. pain  60 tablet  2  . lactulose (CHRONULAC) 10 GM/15ML solution Take 20 g by mouth 3 (three) times daily.        Marland Kitchen levETIRAcetam (  KEPPRA) 500 MG tablet Take 500 mg by mouth 2 (two) times daily.        . metoprolol (LOPRESSOR) 50 MG tablet Take 50 mg by mouth 3 (three) times daily.        Marland Kitchen omega-3 acid ethyl esters (LOVAZA) 1 G capsule Take 1 g by mouth 2 (two) times daily.      Marland Kitchen omeprazole (PRILOSEC) 20 MG capsule Take 20 mg by mouth daily.        . peg 3350 powder (MOVIPREP) SOLR Take 1 kit (100 g total) by mouth once. As directed Please purchase 1 Fleets enema to use with the prep  1 kit  0  . potassium chloride SA (K-DUR,KLOR-CON) 20 MEQ tablet Take 20 mEq by mouth daily.        . rifaximin (XIFAXAN) 550 MG TABS Take 550 mg by mouth 2 (two) times daily.       Review of systems he complains of no fever chills or weight loss, he does have double vision corrected by lenses. Denies headache. He has cardiomyopathy but no  chest pain. Denies shortness of breath heartburn frequency. Denies rash or itch. He does have unsteady gait. He does have some anxiety and depression. He has some liver disease but denies easy bleeding. Excessive thirst or urination has not been a complaint and he has no allergic reactions to food  Examination BP 140/70  Ht 5' 6"  (1.676 m)  Wt 227 lb (102.967 kg)  BMI 36.64 kg/m2 The patient is of medium to large build. His grooming is normal. His hygiene is normal. Ex line is oriented x3. His mood is pleasant.  He is ambulating with a limp and external rotation deformity which actually starts in the hip and is perpetuated throughout the limb including planovalgus deformity which is chronic. He ambulates with a walker.  He has limited range of motion in his ankle joint 10. It is swollen it is tender around the joint. The incision is clean without erythema no drainage it healed well. He has normal muscle tone in the calf and lower leg appear skin as stated [place] warm to touch he has peripheral edema and venous stasis changes of the skin with an intact dorsalis pedis pulse posterior tibial pulse not palpable. He does have pain sensation pressure sensation. Reflexes not tested balance adequate but compromised.  Today's x-ray compared to previous x-rays show some loosening of the hardware possible persistent fracture line in the lateral malleolus. Medial malleolus fracture healed. Distal joint, and plafond depression.  Impression failure of ankle fixation status post bimalleolar fracture diabetic with peripheral neuropathy.  Plan I placed him back in a Cam Walker and told him to keep using his walker. I advised him that he should have ankle fusion and will be referred to appropriate specialist  He does have an appointment with the liver specialist and he also has some cranial aneurysms that may preclude surgery. If he cannot have surgery he is at risk for hardware failure and amputation.

## 2012-04-26 NOTE — Patient Instructions (Addendum)
Referral to [facility] for ankle fusion, failed OTIF ankle   Place back in cam walker

## 2012-04-28 ENCOUNTER — Ambulatory Visit (HOSPITAL_COMMUNITY)
Admission: RE | Admit: 2012-04-28 | Discharge: 2012-04-28 | Disposition: A | Discharge status: Home / Self Care | Payer: Medicare Other | Source: Ambulatory Visit | Attending: Gastroenterology | Admitting: Gastroenterology

## 2012-04-28 ENCOUNTER — Ambulatory Visit: Payer: Medicare Other | Admitting: Gastroenterology

## 2012-04-28 ENCOUNTER — Encounter: Payer: Self-pay | Admitting: Gastroenterology

## 2012-04-28 ENCOUNTER — Other Ambulatory Visit: Payer: Self-pay | Admitting: *Deleted

## 2012-04-28 ENCOUNTER — Ambulatory Visit (INDEPENDENT_AMBULATORY_CARE_PROVIDER_SITE_OTHER): Payer: Medicare Other | Admitting: Gastroenterology

## 2012-04-28 VITALS — BP 132/71 | HR 74 | Temp 98.2°F | Ht 66.0 in | Wt 232.8 lb

## 2012-04-28 DIAGNOSIS — R600 Localized edema: Secondary | ICD-10-CM

## 2012-04-28 DIAGNOSIS — K7290 Hepatic failure, unspecified without coma: Secondary | ICD-10-CM | POA: Insufficient documentation

## 2012-04-28 DIAGNOSIS — M79609 Pain in unspecified limb: Secondary | ICD-10-CM | POA: Insufficient documentation

## 2012-04-28 DIAGNOSIS — R609 Edema, unspecified: Secondary | ICD-10-CM

## 2012-04-28 DIAGNOSIS — D649 Anemia, unspecified: Secondary | ICD-10-CM

## 2012-04-28 DIAGNOSIS — K7682 Hepatic encephalopathy: Secondary | ICD-10-CM | POA: Insufficient documentation

## 2012-04-28 DIAGNOSIS — S82843A Displaced bimalleolar fracture of unspecified lower leg, initial encounter for closed fracture: Secondary | ICD-10-CM

## 2012-04-28 DIAGNOSIS — M7989 Other specified soft tissue disorders: Secondary | ICD-10-CM | POA: Insufficient documentation

## 2012-04-28 DIAGNOSIS — K7460 Unspecified cirrhosis of liver: Secondary | ICD-10-CM

## 2012-04-28 LAB — PROTIME-INR: INR: 1.4 (ref <1.50)

## 2012-04-28 NOTE — Assessment & Plan Note (Signed)
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 Has h/o portal gastropathy as well. Need current INR. Will calculate MELD. Patient has been referred to Hospice by PCP. He has not been referred to Liver transplant center likely due to multiple comorbidities. Will discuss further with Dr. Jena Gauss, to make sure patient has been offered all appropriate options. He is currently up-to-date on hepatoma surveillance.   Discussed at length with patient, the need to reduce his ammonia level. He is not taking lactulose appropriately. Unable to afford Xifaxan. We will see if Hospice can obtain medication for patient.   Further recommendations to follow.

## 2012-04-28 NOTE — Progress Notes (Signed)
Primary Care Physician: Mitzi Hansen, NP  Primary Gastroenterologist:  Roetta Sessions, MD   Chief Complaint  Patient presents with  . Abdominal Pain  . abnormal LFT'S    HPI: [name] [name] is a 51 y.o. male here for further evaluation of elevated ammonia level, cirrhosis, anemia at request of Mitzi Hansen, NP at [facility].   Patient last seen 12/07/2011 for anemia. EGD/TCS as outlined below. H/O suspected NASH cirrhosis (AMA positive). H/O thrombocytopenia, portal gastropathy, hepatic encephalopathy.   Patient presents with complaints of several months of confusion, wandering, sleeps a lot. Does not take lactulose daily b/c of all the doctor's appointments. Sometimes takes it all at one time and then feels sick. Has not been on Xifaxan for couple of months due to being unable to afford it. States he needs redo of right ankle/leg. Lots of foot pain. C/O right leg swelling more over the last two days. Pain in back of calf. Stays tired. Eats good. No melena, brbpr. Gas/bloating pain. Denies heartburn, n/v. Weight is up from 218 on 04/04/12 at PCP to 232 today. Patient has been referred to Hospice for his liver disease by PCP.    Current Outpatient Prescriptions  Medication Sig Dispense Refill  . ALPRAZolam (XANAX) 0.5 MG tablet Take 0.5 mg by mouth 3 (three) times daily.        Marland Kitchen aspirin EC 81 MG tablet Take 81 mg by mouth daily.      . Cholecalciferol (VITAMIN D) 2000 UNITS tablet Take 2,000 Units by mouth daily.      Marland Kitchen ezetimibe (ZETIA) 10 MG tablet Take 10 mg by mouth daily.        Marland Kitchen FLUoxetine (PROZAC) 20 MG capsule Take 20 mg by mouth daily.        . furosemide (LASIX) 20 MG tablet Take 20 mg by mouth 2 (two) times daily.       Marland Kitchen HYDROcodone-acetaminophen (VICODIN) 5-500 MG per tablet Take 1 tablet by mouth every 6 (six) hours as needed. pain  60 tablet  2  . lactulose (CHRONULAC) 10 GM/15ML solution Take 40 g by mouth 3 (three) times daily.       Marland Kitchen levETIRAcetam  (KEPPRA) 500 MG tablet Take 500 mg by mouth 2 (two) times daily.        . metoprolol (LOPRESSOR) 50 MG tablet Take 50 mg by mouth 3 (three) times daily.        Marland Kitchen omega-3 acid ethyl esters (LOVAZA) 1 G capsule Take 1 g by mouth 2 (two) times daily.      Marland Kitchen omeprazole (PRILOSEC) 20 MG capsule Take 20 mg by mouth daily.        . potassium chloride SA (K-DUR,KLOR-CON) 20 MEQ tablet Take 20 mEq by mouth daily.        . rifaximin (XIFAXAN) 550 MG TABS Take 550 mg by mouth 2 (two) times daily.        Allergies as of 04/28/2012 - Review Complete 04/28/2012  Allergen Reaction Noted  . Penicillins Rash 02/02/2011   Past Medical History  Diagnosis Date  . Depression   . 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 Bipolar 1 disorder   . Hypertension   . Diabetes mellitus   . Seizures   . COPD (chronic obstructive pulmonary disease)   . Mental retardation   . Blind right eye   . GERD (gastroesophageal reflux disease)   . Chronic back pain   . Hepatic encephalopathy   . Anemia   .  Thrombocytopenia   . Cardiomegaly   . Anxiety   . Arthritis   . Cholelithiasis     Asymptomatic  . Cerebrovascular disease     Right vertebral artery  . Hard of hearing   . Hyperlipidemia   . Cataract     Right eye  . Blood clots in brain     per patient  . Cirrhosis     diagnosed 07/2010 when presented with first episode of hepatic encephalopathy   Past Surgical History  Procedure Date  . Tonsillectomy   . Ear operations   . Cast application 08/30/2011    Procedure: MINOR CAST APPLICATION;  Surgeon: Fuller Canada, MD;  Location: [facility];  Service: Orthopedics;  Laterality: Right;  no anesthesia , procedure room do not need or room !  . Orif ankle fracture 08/27/2011    Procedure: OPEN REDUCTION INTERNAL FIXATION (ORIF) ANKLE FRACTURE;  Surgeon: Fuller Canada, MD;  Location: [facility];  Service: Orthopedics;  Laterality: Right;  . Esophagogastroduodenoscopy 02/02/2011    Rourk-Normal esophagus.  No varices/Question  mild portal gastropathy, extrinsic compression on the antrum, lesser curvature of uncertain significance, some minimally  nodular mucosa status post biopsy, patent pylorus, normal duodenum 1 and duodenum 2.  . Colonoscopy 12/15/11    colonic divericulosis;poor prep, ACBE  recommended but has not been done  . Esophagogastroduodenoscopy 12/15/11    Rourk-->mild changes of portal gastropathy, gastric/duodenal erosions s/p bx  (reactive changes with mild chronic inflammation. No H.pylori   History   Social History  . Marital Status: Married    Spouse Name: N/A    Number of Children: 0  . Years of Education: N/A   Occupational History  . disabled    Social History Main Topics  . Smoking status: Current Everyday Smoker -- 1.0 packs/day for 30 years    Types: Cigarettes  . Smokeless tobacco: None   Comment: only smokes about 1-2 a day  . Alcohol Use: No  . Drug Use: No  . Sexually Active: None   Other Topics Concern  . None   Social History Narrative  . None    ROS:  General: Negative for anorexia, weight loss, fever, chills, fatigue, weakness. ENT: Negative for hoarseness, difficulty swallowing , nasal congestion. CV: Negative for chest pain, angina, palpitations, dyspnea on exertion, peripheral edema.  Respiratory: Negative for dyspnea at rest, dyspnea on exertion, cough, sputum, wheezing.  GI: See history of present illness. GU:  Negative for dysuria, hematuria, urinary incontinence, urinary frequency, nocturnal urination.  Endo: Negative for unusual weight change.  MS: chronic right ankle pain.   Physical Examination:   BP 132/71  Pulse 74  Temp 98.2 F (36.8 C) (Temporal)  Ht 5\' 6"  (1.676 m)  Wt 232 lb 12.8 oz (105.597 kg)  BMI 37.57 kg/m2  General: Chronically ill-appearing WM in NAD. Appears much older than stated age. Accompanied by wife.   Eyes: No icterus. Mouth: Oropharyngeal mucosa moist and pink , no lesions erythema or exudate. Lungs: Clear to  auscultation bilaterally.  Heart: Regular rate and rhythm, no murmurs rubs or gallops.  Abdomen: Bowel sounds are normal, full in ruq, nondistended, no masses, no abdominal bruits or hernia , no rebound or guarding.   Extremities: R>L lower extremity edema. Deformity of right ankle. Neuro: Alert and oriented x 4   Skin: Warm and dry, no jaundice.   Psych: Alert and cooperative, normal mood and affect.  Labs: Labs from 04/04/2012. Sodium 144, potassium 4.4, glucose 114, BUN 15, creatinine  0.8, calcium 8.5, AST 36, ALT 27, alkaline phosphatase 112, total bilirubin 0.9, albumin 2.7, ammonia level 316, TIBC 330, iron 69, iron saturation 21%, ferritin 21, vitamin B12 717, folate 12.3, white blood cell count 2500, hemoglobin 8.7, hematocrit 27.6, MCV 88, platelets 58,000, TSH 1.290  Imaging Studies: Dg Ankle Complete Right  04/26/2012  Radiology report  Persistent right ankle pain status post plate fixation of right ankle  3 views right ankle  Previous x-rays are evaluated for comparison  There is is valgus deformity of the right ankle joint. There is  significant heterotopic bone around the ankle joint. There is deformity of  the tibial plafond with depression.  There are 2 screws which appear to be failing and there may be a fracture  at the midshaft of the fibula.  The medial malleolar fracture appears to have healed. It is questionable  whether the lateral malleolus fracture has refractured.  Impression: Hardware loosening suggestive of hardware failure. With  deformity of ankle joint.

## 2012-04-28 NOTE — Assessment & Plan Note (Signed)
Swelling into right leg/calf. Greater than left leg. Even with history of ankle issues, swelling progressed over past two days and associated with calf pain. Needs STAT doppler to rule out DVT.

## 2012-04-28 NOTE — Patient Instructions (Addendum)
We have scheduled you for a STAT ultrasound to rule out DVT/clot in your leg. Please have your blood work done while at the hospital.  Take Xifaxan one pill twice a day. We have given you samples. We will see if Hospice can get your medication for you.

## 2012-05-01 ENCOUNTER — Telehealth: Payer: Self-pay

## 2012-05-01 NOTE — Progress Notes (Signed)
Faxed to PCP

## 2012-05-01 NOTE — Telephone Encounter (Signed)
T/C from [place] of [facility]. She said they received a referral from [facility] which is a for profit organiization. Pt was referred to [facility] is a non profit organization.) She said they were told that pt had OV appt with Dr. Gala Romney on Friday 04/28/2012. I told her pt saw Neil Crouch, PA, instead of Dr. Gala Romney. She said that the information that they received did not support a need for Hospice weekly. They could place him on a Supportive Care Service and he would have a visit from a Hospice Nurse once monthly. She asked if pt had recent labs, etc that would be beneficial. I also told her Leslie's recommendation for the Xifaxin and she said that would be supportive of aggressive care. She would like a copy of info faxed to them at [place]. Her phone number is [phone].    Per Sofie Rower, office manager, they need to contact the referring office. I called and informed Olean Ree.

## 2012-05-01 NOTE — Progress Notes (Signed)
Quick Note:  Please let patient know, no evidence of DVT. I will call them once labs reviewed. ______

## 2012-05-02 ENCOUNTER — Telehealth: Payer: Self-pay | Admitting: Internal Medicine

## 2012-05-02 NOTE — Telephone Encounter (Signed)
See result note.  

## 2012-05-02 NOTE — Telephone Encounter (Signed)
Pt's wife called to see if the patient's lab results were back. Please call patient at [phone]

## 2012-05-02 NOTE — Progress Notes (Signed)
Quick Note:  [name] [name]. [name] Cocker B. Based on prognostic factors, he likely has greater than six months from liver standpoint. Patient apparently does not qualify for Hospice on weekly basis, possibly monthly. Referral originated from PCP.  Discussed labs with wife.   Would continue hepatoma screening, Due for AFP 05/2012 and abd u/s in 11/2012. He has one last hep a/b shot to receive. He no longer drives, let his licence go back. He is up to date on EGD. He also has h/o anemia since early this year. Colonoscopy was incomplete due to bad prep. Barium enema recommended but not did not pursue. I have asked wife to discuss with patient to see if he is interested. We cannot exclude malignancy as cause of anemia. She will let us know. Patient has refused hematology referral as suggested by PCP. He needs to take lactulose daily, cannot afford xifaxan and looks like Hospice not going to help.  He needs OV with RMR in six weeks. ______

## 2012-05-02 NOTE — Telephone Encounter (Signed)
Routed to LSL

## 2012-05-02 NOTE — Telephone Encounter (Signed)
Agree. We did not refer him to Hospice, his PCP did. Xifaxan used for hepatic encephalopathy prevention, I would not consider that aggressive therapy. We did not do significant amount of labs, they were already done by PCP.

## 2012-05-03 ENCOUNTER — Other Ambulatory Visit: Payer: Self-pay | Admitting: Gastroenterology

## 2012-05-03 DIAGNOSIS — K7460 Unspecified cirrhosis of liver: Secondary | ICD-10-CM

## 2012-05-03 NOTE — Progress Notes (Signed)
Quick Note:  Lab order on file. Darl Pikes please make pt appt with RMR and nic u/s in 11/2012 ______

## 2012-05-05 ENCOUNTER — Telehealth: Payer: Self-pay | Admitting: Orthopedic Surgery

## 2012-05-05 DIAGNOSIS — S82843A Displaced bimalleolar fracture of unspecified lower leg, initial encounter for closed fracture: Secondary | ICD-10-CM

## 2012-05-05 DIAGNOSIS — M19079 Primary osteoarthritis, unspecified ankle and foot: Secondary | ICD-10-CM

## 2012-05-05 NOTE — Telephone Encounter (Signed)
[name] called about Sh[place] needing a wheelchair.  She said she never heard anything from the request earlier this week, but he really needs one.  Wants order sent to [facility]

## 2012-05-08 NOTE — Telephone Encounter (Signed)
Order faxed to [place]

## 2012-05-09 ENCOUNTER — Other Ambulatory Visit: Payer: Self-pay

## 2012-05-09 DIAGNOSIS — K7460 Unspecified cirrhosis of liver: Secondary | ICD-10-CM

## 2012-05-11 ENCOUNTER — Encounter: Payer: Self-pay | Admitting: Internal Medicine

## 2012-06-02 ENCOUNTER — Telehealth: Payer: Self-pay | Admitting: Orthopedic Surgery

## 2012-06-02 NOTE — Telephone Encounter (Signed)
Per call from patient (wife) patient has been contacted and scheduled for referral appointment, [place], for 07/03/12 at 2:15pm with Dr. Doran Durand.  Patient's wife states that they are to bring all records and films to the appointment.  I verified the appointment with Krystin.  Patient has already picked up all films at [facility], and have  been advised that they pick up copies of all records here, to   have with them for the appointment.

## 2012-06-06 NOTE — Telephone Encounter (Signed)
Notified patient/wife that copy of records are ready to carry to the appointment per Susan [name] request (copy of all notes had previously been faxed to them to [phone], by nurse, with referral.  Patient to pick up prior to 07/03/12 appt

## 2012-06-20 ENCOUNTER — Ambulatory Visit: Payer: Medicare Other | Admitting: Internal Medicine

## 2012-06-30 ENCOUNTER — Telehealth: Payer: Self-pay | Admitting: Orthopedic Surgery

## 2012-06-30 NOTE — Telephone Encounter (Signed)
Reminder call made to patient to pick up copies of records for referral appointment scheduled for Monday, 07/03/12, at [facility] w/Dr. Doran Durand.  Patient's wife states needs to re-schedule for December, due to her very recent surgery.  Advised to call this office today to cancel/re-schedule, as they need as much advance notice as possible.  Aware to pick up records here.

## 2012-07-28 ENCOUNTER — Telehealth: Payer: Self-pay | Admitting: Gastroenterology

## 2012-07-28 NOTE — Telephone Encounter (Signed)
Patient needs to reschedule his OV with RMR only. Due for AFP now.

## 2012-07-31 NOTE — Telephone Encounter (Signed)
Sent reminder letter and lab order to pt.

## 2012-08-01 NOTE — Telephone Encounter (Signed)
Routing to [place] for OV with RMR only.

## 2012-08-03 ENCOUNTER — Encounter: Payer: Self-pay | Admitting: Internal Medicine

## 2012-08-03 NOTE — Telephone Encounter (Signed)
Pt is aware of OV on 1/24 at 0830 with RMR and appt card was mailed

## 2012-09-15 ENCOUNTER — Ambulatory Visit: Payer: Medicare Other | Admitting: Internal Medicine

## 2012-09-15 ENCOUNTER — Telehealth: Payer: Self-pay | Admitting: Internal Medicine

## 2012-09-15 NOTE — Telephone Encounter (Signed)
Pt was a no show

## 2012-09-17 ENCOUNTER — Emergency Department (HOSPITAL_COMMUNITY): Payer: Medicare Other

## 2012-09-17 ENCOUNTER — Inpatient Hospital Stay (HOSPITAL_COMMUNITY)
Admission: EM | Admit: 2012-09-17 | Discharge: 2012-09-20 | DRG: 442 | Disposition: A | Discharge status: Home Health | Payer: Medicare Other | Attending: Internal Medicine | Admitting: Internal Medicine

## 2012-09-17 ENCOUNTER — Encounter (HOSPITAL_COMMUNITY): Payer: Self-pay

## 2012-09-17 DIAGNOSIS — F17200 Nicotine dependence, unspecified, uncomplicated: Secondary | ICD-10-CM | POA: Diagnosis present

## 2012-09-17 DIAGNOSIS — K219 Gastro-esophageal reflux disease without esophagitis: Secondary | ICD-10-CM

## 2012-09-17 DIAGNOSIS — Z23 Encounter for immunization: Secondary | ICD-10-CM

## 2012-09-17 DIAGNOSIS — J4489 Other specified chronic obstructive pulmonary disease: Secondary | ICD-10-CM | POA: Diagnosis present

## 2012-09-17 DIAGNOSIS — K7460 Unspecified cirrhosis of liver: Secondary | ICD-10-CM | POA: Diagnosis present

## 2012-09-17 DIAGNOSIS — F319 Bipolar disorder, unspecified: Secondary | ICD-10-CM | POA: Diagnosis present

## 2012-09-17 DIAGNOSIS — D649 Anemia, unspecified: Secondary | ICD-10-CM

## 2012-09-17 DIAGNOSIS — F411 Generalized anxiety disorder: Secondary | ICD-10-CM | POA: Diagnosis present

## 2012-09-17 DIAGNOSIS — F329 Major depressive disorder, single episode, unspecified: Secondary | ICD-10-CM

## 2012-09-17 DIAGNOSIS — Z66 Do not resuscitate: Secondary | ICD-10-CM | POA: Diagnosis present

## 2012-09-17 DIAGNOSIS — I679 Cerebrovascular disease, unspecified: Secondary | ICD-10-CM | POA: Diagnosis present

## 2012-09-17 DIAGNOSIS — Z9119 Patient's noncompliance with other medical treatment and regimen: Secondary | ICD-10-CM

## 2012-09-17 DIAGNOSIS — Z91199 Patient's noncompliance with other medical treatment and regimen due to unspecified reason: Secondary | ICD-10-CM

## 2012-09-17 DIAGNOSIS — K920 Hematemesis: Secondary | ICD-10-CM

## 2012-09-17 DIAGNOSIS — S82843A Displaced bimalleolar fracture of unspecified lower leg, initial encounter for closed fracture: Secondary | ICD-10-CM

## 2012-09-17 DIAGNOSIS — R600 Localized edema: Secondary | ICD-10-CM

## 2012-09-17 DIAGNOSIS — I10 Essential (primary) hypertension: Secondary | ICD-10-CM | POA: Diagnosis present

## 2012-09-17 DIAGNOSIS — E7220 Disorder of urea cycle metabolism, unspecified: Secondary | ICD-10-CM

## 2012-09-17 DIAGNOSIS — K7290 Hepatic failure, unspecified without coma: Principal | ICD-10-CM | POA: Diagnosis present

## 2012-09-17 DIAGNOSIS — K769 Liver disease, unspecified: Secondary | ICD-10-CM

## 2012-09-17 DIAGNOSIS — D696 Thrombocytopenia, unspecified: Secondary | ICD-10-CM

## 2012-09-17 DIAGNOSIS — F79 Unspecified intellectual disabilities: Secondary | ICD-10-CM | POA: Diagnosis present

## 2012-09-17 DIAGNOSIS — M19079 Primary osteoarthritis, unspecified ankle and foot: Secondary | ICD-10-CM

## 2012-09-17 DIAGNOSIS — F32A Depression, unspecified: Secondary | ICD-10-CM

## 2012-09-17 DIAGNOSIS — D61818 Other pancytopenia: Secondary | ICD-10-CM | POA: Diagnosis present

## 2012-09-17 DIAGNOSIS — Z720 Tobacco use: Secondary | ICD-10-CM

## 2012-09-17 DIAGNOSIS — Z79899 Other long term (current) drug therapy: Secondary | ICD-10-CM

## 2012-09-17 DIAGNOSIS — Z7982 Long term (current) use of aspirin: Secondary | ICD-10-CM

## 2012-09-17 DIAGNOSIS — K7682 Hepatic encephalopathy: Principal | ICD-10-CM | POA: Diagnosis present

## 2012-09-17 DIAGNOSIS — G40909 Epilepsy, unspecified, not intractable, without status epilepticus: Secondary | ICD-10-CM | POA: Diagnosis present

## 2012-09-17 DIAGNOSIS — E785 Hyperlipidemia, unspecified: Secondary | ICD-10-CM | POA: Diagnosis present

## 2012-09-17 DIAGNOSIS — K766 Portal hypertension: Secondary | ICD-10-CM | POA: Diagnosis present

## 2012-09-17 DIAGNOSIS — D689 Coagulation defect, unspecified: Secondary | ICD-10-CM

## 2012-09-17 DIAGNOSIS — E119 Type 2 diabetes mellitus without complications: Secondary | ICD-10-CM | POA: Diagnosis present

## 2012-09-17 DIAGNOSIS — M129 Arthropathy, unspecified: Secondary | ICD-10-CM | POA: Diagnosis present

## 2012-09-17 DIAGNOSIS — J449 Chronic obstructive pulmonary disease, unspecified: Secondary | ICD-10-CM

## 2012-09-17 DIAGNOSIS — K319 Disease of stomach and duodenum, unspecified: Secondary | ICD-10-CM | POA: Diagnosis present

## 2012-09-17 LAB — APTT: aPTT: 42 seconds — ABNORMAL HIGH (ref 24–37)

## 2012-09-17 LAB — CBC WITH DIFFERENTIAL/PLATELET
Basophils Absolute: 0 10*3/uL (ref 0.0–0.1)
Eosinophils Absolute: 0.1 10*3/uL (ref 0.0–0.7)
HCT: 24.2 % — ABNORMAL LOW (ref 39.0–52.0)
Lymphs Abs: 1.2 10*3/uL (ref 0.7–4.0)
MCHC: 28.9 g/dL — ABNORMAL LOW (ref 30.0–36.0)
MCV: 81.8 fL (ref 78.0–100.0)
Monocytes Absolute: 0.3 10*3/uL (ref 0.1–1.0)
Neutro Abs: 1 10*3/uL — ABNORMAL LOW (ref 1.7–7.7)
RDW: 20.9 % — ABNORMAL HIGH (ref 11.5–15.5)

## 2012-09-17 LAB — COMPREHENSIVE METABOLIC PANEL
Alkaline Phosphatase: 113 U/L (ref 39–117)
BUN: 7 mg/dL (ref 6–23)
Calcium: 9 mg/dL (ref 8.4–10.5)
Creatinine, Ser: 0.64 mg/dL (ref 0.50–1.35)
GFR calc Af Amer: >90 mL/min (ref >90)
Glucose, Bld: 100 mg/dL — ABNORMAL HIGH (ref 70–99)
Total Protein: 6.5 g/dL (ref 6.0–8.3)

## 2012-09-17 LAB — AFP TUMOR MARKER: AFP-Tumor Marker: 2.6 ng/mL (ref 0.0–8.0)

## 2012-09-17 LAB — URINALYSIS, ROUTINE W REFLEX MICROSCOPIC
Ketones, ur: NEGATIVE mg/dL
Leukocytes, UA: NEGATIVE
Nitrite: NEGATIVE
Specific Gravity, Urine: 1.01 (ref 1.005–1.030)
pH: 7.5 (ref 5.0–8.0)

## 2012-09-17 LAB — PREPARE RBC (CROSSMATCH)

## 2012-09-17 LAB — TROPONIN I: Troponin I: <0.3 ng/mL (ref <0.30)

## 2012-09-17 MED ORDER — SODIUM CHLORIDE 0.9 % IV BOLUS (SEPSIS)
500.0000 mL | Freq: Once | INTRAVENOUS | Status: AC
  Administered 2012-09-17: 500 mL via INTRAVENOUS

## 2012-09-17 MED ORDER — LACTULOSE 10 GM/15ML PO SOLN
30.0000 g | Freq: Once | ORAL | Status: AC
  Administered 2012-09-17: 30 g via ORAL
  Filled 2012-09-17: qty 30

## 2012-09-17 MED ORDER — PANTOPRAZOLE SODIUM 40 MG PO TBEC
40.0000 mg | DELAYED_RELEASE_TABLET | Freq: Every day | ORAL | Status: DC
  Administered 2012-09-18 – 2012-09-20 (×3): 40 mg via ORAL
  Filled 2012-09-17 (×3): qty 1

## 2012-09-17 MED ORDER — PNEUMOCOCCAL VAC POLYVALENT 25 MCG/0.5ML IJ INJ
0.5000 mL | INJECTION | INTRAMUSCULAR | Status: DC
  Filled 2012-09-17: qty 0.5

## 2012-09-17 MED ORDER — SODIUM CHLORIDE 0.9 % IV SOLN
INTRAVENOUS | Status: DC
  Administered 2012-09-17: 18:00:00 via INTRAVENOUS

## 2012-09-17 MED ORDER — FUROSEMIDE 20 MG PO TABS
20.0000 mg | ORAL_TABLET | Freq: Two times a day (BID) | ORAL | Status: DC
  Administered 2012-09-17 – 2012-09-20 (×6): 20 mg via ORAL
  Filled 2012-09-17 (×6): qty 1

## 2012-09-17 MED ORDER — INFLUENZA VIRUS VACC SPLIT PF IM SUSP
0.5000 mL | INTRAMUSCULAR | Status: DC
  Filled 2012-09-17: qty 0.5

## 2012-09-17 MED ORDER — RIFAXIMIN 550 MG PO TABS
550.0000 mg | ORAL_TABLET | Freq: Two times a day (BID) | ORAL | Status: DC
  Administered 2012-09-18 – 2012-09-20 (×4): 550 mg via ORAL
  Filled 2012-09-17 (×11): qty 1

## 2012-09-17 MED ORDER — ONDANSETRON HCL 4 MG/2ML IJ SOLN: 4.0000 mg | Freq: Four times a day (QID) | INTRAMUSCULAR | Status: DC | PRN

## 2012-09-17 MED ORDER — METOPROLOL TARTRATE 50 MG PO TABS
50.0000 mg | ORAL_TABLET | Freq: Three times a day (TID) | ORAL | Status: DC
  Administered 2012-09-17 – 2012-09-20 (×8): 50 mg via ORAL
  Filled 2012-09-17 (×8): qty 1

## 2012-09-17 MED ORDER — LEVETIRACETAM 500 MG PO TABS
500.0000 mg | ORAL_TABLET | Freq: Two times a day (BID) | ORAL | Status: DC
  Administered 2012-09-17 – 2012-09-20 (×6): 500 mg via ORAL
  Filled 2012-09-17 (×9): qty 1

## 2012-09-17 MED ORDER — ONDANSETRON HCL 4 MG PO TABS: 4.0000 mg | ORAL_TABLET | Freq: Four times a day (QID) | ORAL | Status: DC | PRN

## 2012-09-17 MED ORDER — LACTULOSE 10 GM/15ML PO SOLN
40.0000 g | Freq: Three times a day (TID) | ORAL | Status: DC
  Administered 2012-09-17 – 2012-09-18 (×4): 40 g via ORAL
  Filled 2012-09-17 (×4): qty 60

## 2012-09-17 NOTE — ED Provider Notes (Signed)
History     CSN: [id number]  Arrival date & time 09/17/12  1115   First MD Initiated Contact with Patient 09/17/12 1302      Chief Complaint  Patient presents with  . Fall    (Consider location/radiation/quality/duration/timing/severity/associated sxs/prior treatment) HPI Level 5 caveat due to liver failure. Pt with history of multiple medical problems, primarily related to cirrhosis and liver failure was brought by wife who reports she has had worsening in mental status recently, falling frequently including earlier today. HE was apparently complaining of R ankle pain after his fall. The wife states she is having difficulty caring for him at home and is interested in SNF placement.    Past Medical History  Diagnosis Date  . Depression   . 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 Bipolar 1 disorder   . Hypertension   . Diabetes mellitus   . Seizures   . COPD (chronic obstructive pulmonary disease)   . Mental retardation   . Blind right eye   . GERD (gastroesophageal reflux disease)   . Chronic back pain   . Hepatic encephalopathy   . Anemia   . Thrombocytopenia   . Cardiomegaly   . Anxiety   . Arthritis   . Cholelithiasis     Asymptomatic  . Cerebrovascular disease     Right vertebral artery  . Hard of hearing   . Hyperlipidemia   . Cataract     Right eye  . Blood clots in brain     per patient  . Cirrhosis     diagnosed 07/2010 when presented with first episode of hepatic encephalopathy, afp on 416/13=4.8,  U/S on 12/14/11  liver stable, pt has received 2 hep A/B vaccines  . Diverticulosis     Past Surgical History  Procedure Date  . Tonsillectomy   . Ear operations   . Cast application 05/01/2425    Procedure: MINOR CAST APPLICATION;  Surgeon: [name], MD;  Location: [facility];  Service: Orthopedics;  Laterality: Right;  no anesthesia , procedure room do not need or room !  . Orif ankle fracture 08/27/2011    Procedure: OPEN REDUCTION INTERNAL FIXATION (ORIF)  ANKLE FRACTURE;  Surgeon: [name], MD;  Location: [facility];  Service: Orthopedics;  Laterality: Right;  . Esophagogastroduodenoscopy 02/02/2011    Rourk-Normal esophagus.  No varices/Question mild portal gastropathy, extrinsic compression on the antrum, lesser curvature of uncertain significance, some minimally  nodular mucosa status post biopsy, patent pylorus, normal duodenum 1 and duodenum 2.  . Colonoscopy 12/15/11    colonic divericulosis;poor prep, ACBE  recommended but has not been done  . Esophagogastroduodenoscopy 12/15/11    Rourk-->mild changes of portal gastropathy, gastric/duodenal erosions s/p bx  (reactive changes with mild chronic inflammation. No H.pylori    Family History  Problem Relation Age of Onset  . Heart failure Mother   . Thyroid disease Mother   . Cancer Father   . Asthma Brother   . Heart attack Brother   . Cirrhosis Brother     EtOH  . Colon cancer Maternal Aunt     History  Substance Use Topics  . Smoking status: Current Every Day Smoker -- 1.0 packs/day for 30 years    Types: Cigarettes  . Smokeless tobacco: Not on file     Comment: only smokes about 1-2 a day  . Alcohol Use: No      Review of Systems Unable to assess due to mental status.   Allergies  Penicillins  Home Medications   Current Outpatient Rx  Name  Route  Sig  Dispense  Refill  . ALPRAZOLAM 0.5 MG PO TABS   Oral   Take 0.5 mg by mouth 3 (three) times daily.           . ASPIRIN EC 81 MG PO TBEC   Oral   Take 81 mg by mouth daily.         Marland Kitchen VITAMIN D 2000 UNITS PO TABS   Oral   Take 2,000 Units by mouth daily.         Marland Kitchen EZETIMIBE 10 MG PO TABS   Oral   Take 10 mg by mouth daily.           Marland Kitchen FLUOXETINE HCL 20 MG PO CAPS   Oral   Take 20 mg by mouth daily.           . FUROSEMIDE 20 MG PO TABS   Oral   Take 20 mg by mouth 2 (two) times daily.          Marland Kitchen HYDROCODONE-ACETAMINOPHEN 5-500 MG PO TABS   Oral   Take 1 tablet by mouth every 6 (six)  hours as needed. pain   60 tablet   2   . LACTULOSE 10 GM/15ML PO SOLN   Oral   Take 40 g by mouth 3 (three) times daily.          Marland Kitchen LEVETIRACETAM 500 MG PO TABS   Oral   Take 500 mg by mouth 2 (two) times daily.           Marland Kitchen METOPROLOL TARTRATE 50 MG PO TABS   Oral   Take 50 mg by mouth 3 (three) times daily.           . OMEGA-3-ACID ETHYL ESTERS 1 G PO CAPS   Oral   Take 1 g by mouth 2 (two) times daily.         Marland Kitchen OMEPRAZOLE 20 MG PO CPDR   Oral   Take 20 mg by mouth daily.           Marland Kitchen POTASSIUM CHLORIDE CRYS ER 20 MEQ PO TBCR   Oral   Take 20 mEq by mouth daily.           Marland Kitchen RIFAXIMIN 550 MG PO TABS   Oral   Take 550 mg by mouth 2 (two) times daily.           BP 133/66  Pulse 54  Temp 98.8 F (37.1 C) (Oral)  Resp 20  Ht 5' 4"  (1.626 m)  Wt 230 lb (104.327 kg)  BMI 39.48 kg/m2  SpO2 100%  Physical Exam  Nursing note and vitals reviewed. Constitutional: He appears well-developed and well-nourished.  HENT:  Head: Normocephalic and atraumatic.  Eyes:       Cataract R eye, L eye is normal  Neck: Normal range of motion. Neck supple.  Cardiovascular: Normal rate, normal heart sounds and intact distal pulses.   Pulmonary/Chest: Effort normal and breath sounds normal.  Abdominal: Bowel sounds are normal. He exhibits no distension. There is no tenderness.  Genitourinary: Rectum normal. Guaiac negative stool.  Musculoskeletal: Normal range of motion. He exhibits edema (2+ bilateral LE edema). He exhibits no tenderness.  Neurological: He has normal strength. No cranial nerve deficit or sensory deficit.  Skin: Skin is warm and dry. No rash noted. There is pallor.  Psychiatric: He has a normal mood and affect.  ED Course  Procedures (including critical care time)  Labs Reviewed  CBC WITH DIFFERENTIAL - Abnormal; Notable for the following:    WBC 2.6 (*)     RBC 2.96 (*)     Hemoglobin 7.0 (*)     HCT 24.2 (*)     MCH 23.6 (*)     MCHC 28.9  (*)     RDW 20.9 (*)     All other components within normal limits  COMPREHENSIVE METABOLIC PANEL - Abnormal; Notable for the following:    Glucose, Bld 100 (*)     Albumin 2.9 (*)     Total Bilirubin 1.5 (*)     All other components within normal limits  PROTIME-INR - Abnormal; Notable for the following:    Prothrombin Time 18.9 (*)     INR 1.64 (*)     All other components within normal limits  APTT - Abnormal; Notable for the following:    aPTT 42 (*)     All other components within normal limits  AMMONIA - Abnormal; Notable for the following:    Ammonia 112 (*)     All other components within normal limits  URINALYSIS, ROUTINE W REFLEX MICROSCOPIC  TROPONIN I  URINE CULTURE  TYPE AND SCREEN   No results found.   No diagnosis found.    MDM   Date: 09/17/2012  Rate: 70  Rhythm: normal sinus rhythm and artifact  QRS Axis: normal  Intervals: normal  ST/T Wave abnormalities: normal  Conduction Disutrbances:none  Narrative Interpretation:   Old EKG Reviewed: unchanged  2:23 PM Elevated Ammonia to account for mental status changes. He is also pancytopenic (PLT pending, but wife states it has been low before). Wife states she has been told he might have a blood cancer of some sort but he has refused further workup at Oncology office. Will give Lactulose, plan for transfusion. Hemoccult is negative. Admit for further eval.         Charles B. Karle Starch, MD 09/17/12 1425

## 2012-09-17 NOTE — H&P (Signed)
[facility] History and Physical  [name] [name] WYO:[id number] DOB: 1960/11/10 DOA: 09/17/2012  Referring physician: Dr Deveron [name]  PCP: [name] Rota, NP  Specialists: [name] [name]  Chief Complaint: Altered mental status  HPI: [name] [name] is a 52 y.o. male presents with altered mental status for the last week.Has h/o cirrhosis of liver secondary to NASH.Wife unable to take care of him at home.No evidence of GI bleeding. PCP had previously referred patient to hospice.  Review of Systems:  Apart from HPI,other systems negative.  Past Medical History  Diagnosis Date  . Depression   . 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 Bipolar 1 disorder   . Hypertension   . Diabetes mellitus   . Seizures   . COPD (chronic obstructive pulmonary disease)   . Mental retardation   . Blind right eye   . GERD (gastroesophageal reflux disease)   . Chronic back pain   . Hepatic encephalopathy   . Anemia   . Thrombocytopenia   . Cardiomegaly   . Anxiety   . Arthritis   . Cholelithiasis     Asymptomatic  . Cerebrovascular disease     Right vertebral artery  . Hard of hearing   . Hyperlipidemia   . Cataract     Right eye  . Blood clots in brain     per patient  . Cirrhosis     diagnosed 07/2010 when presented with first episode of hepatic encephalopathy, afp on 416/13=4.8,  U/S on 12/14/11  liver stable, pt has received 2 hep A/B vaccines  . Diverticulosis    Past Surgical History  Procedure Date  . Tonsillectomy   . Ear operations   . Cast application 02/27/4127    Procedure: MINOR CAST APPLICATION;  Surgeon: [name] Abbott, MD;  Location: [facility];  Service: Orthopedics;  Laterality: Right;  no anesthesia , procedure room do not need or room !  . Orif ankle fracture 08/27/2011    Procedure: OPEN REDUCTION INTERNAL FIXATION (ORIF) ANKLE FRACTURE;  Surgeon: [name] Abbott, MD;  Location: [facility];  Service: Orthopedics;  Laterality: Right;  . Esophagogastroduodenoscopy 02/02/2011   Rourk-Normal esophagus.  No varices/Question mild portal gastropathy, extrinsic compression on the antrum, lesser curvature of uncertain significance, some minimally  nodular mucosa status post biopsy, patent pylorus, normal duodenum 1 and duodenum 2.  . Colonoscopy 12/15/11    colonic divericulosis;poor prep, ACBE  recommended but has not been done  . Esophagogastroduodenoscopy 12/15/11    Rourk-->mild changes of portal gastropathy, gastric/duodenal erosions s/p bx  (reactive changes with mild chronic inflammation. No H.pylori   Social History:  [name] [name] with wife.No smoking,no alcohol.  Allergies  Allergen Reactions  . Penicillins Rash    Family History  Problem Relation Age of Onset  . Heart failure Mother   . Thyroid disease Mother   . Cancer Father   . Asthma Brother   . Heart attack Brother   . Cirrhosis Brother     EtOH  . Colon cancer Maternal Aunt       Prior to Admission medications   Medication Sig Start Date End Date Taking? Authorizing Provider  ALPRAZolam Duanne Moron) 0.5 MG tablet Take 0.5 mg by mouth 3 (three) times daily.      Historical Provider, MD  aspirin EC 81 MG tablet Take 81 mg by mouth daily.    Historical Provider, MD  Cholecalciferol (VITAMIN D) 2000 UNITS tablet Take 2,000 Units by mouth daily.    Historical Provider, MD  ezetimibe (ZETIA)  10 MG tablet Take 10 mg by mouth daily.      Historical Provider, MD  FLUoxetine (PROZAC) 20 MG capsule Take 20 mg by mouth daily.      Historical Provider, MD  furosemide (LASIX) 20 MG tablet Take 20 mg by mouth 2 (two) times daily.     Historical Provider, MD  HYDROcodone-acetaminophen (VICODIN) 5-500 MG per tablet Take 1 tablet by mouth every 6 (six) hours as needed. pain 02/02/12   [name] Civil, MD  lactulose (CHRONULAC) 10 GM/15ML solution Take 40 g by mouth 3 (three) times daily.     Historical Provider, MD  levETIRAcetam (KEPPRA) 500 MG tablet Take 500 mg by mouth 2 (two) times daily.      Historical  Provider, MD  metoprolol (LOPRESSOR) 50 MG tablet Take 50 mg by mouth 3 (three) times daily.      Historical Provider, MD  omega-3 acid ethyl esters (LOVAZA) 1 G capsule Take 1 g by mouth 2 (two) times daily.    Historical Provider, MD  omeprazole (PRILOSEC) 20 MG capsule Take 20 mg by mouth daily.      Historical Provider, MD  potassium chloride SA (K-DUR,KLOR-CON) 20 MEQ tablet Take 20 mEq by mouth daily.      Historical Provider, MD  rifaximin (XIFAXAN) 550 MG TABS Take 550 mg by mouth 2 (two) times daily.    Historical Provider, MD   Physical Exam: Filed Vitals:   09/17/12 1131  BP: 133/66  Pulse: 54  Temp: 98.8 F (37.1 C)  TempSrc: Oral  Resp: 20  Height: 5' 4"  (1.626 m)  Weight: 104.327 kg (230 lb)  SpO2: 100%     General:  Looks sick,poorly responsive. Mildly jaundice.  Eyes: Mild jaundice. Pale.  ENT: No abnormalities.  Neck: No lymphadenopathy.  Cardiovascular: Heart sounds present without tachycardia. No gallop rhythm.  Respiratory: Lung fields clear.  Abdomen: Soft, nontender. No obvious hepatosplenomegaly.  Skin: No rash.  Musculoskeletal: No joint problems.  Psychiatric: Poorly responsive.  Neurologic: Poorly responsive. No obvious focal neurological signs.  Labs on Admission:  Basic Metabolic Panel:  Lab 02/63/78 1321  NA 138  K 4.0  CL 107  CO2 26  GLUCOSE 100*  BUN 7  CREATININE 0.64  CALCIUM 9.0  MG --  PHOS --   Liver Function Tests:  Lab 09/17/12 1321  AST 23  ALT 8  ALKPHOS 113  BILITOT 1.5*  PROT 6.5  ALBUMIN 2.9*     Lab 09/17/12 1325  AMMONIA 112*   CBC:  Lab 09/17/12 1321  WBC 2.6*  NEUTROABS 1.0*  HGB 7.0*  HCT 24.2*  MCV 81.8  PLT 47*   Cardiac Enzymes:  Lab 09/17/12 1321  CKTOTAL --  CKMB --  CKMBINDEX --  TROPONINI <0.30       Radiological Exams on Admission: Dg Chest 1 View  09/17/2012  *RADIOLOGY REPORT*  Clinical Data: Altered mental status  CHEST - 1 VIEW  Comparison: 01/28/2011   Findings: Cardiomegaly again noted.  No acute infiltrate or pulmonary edema.  Mild basilar atelectasis.  IMPRESSION: Cardiomegaly again noted.  Mild basilar atelectasis.   Original Report Authenticated By: Lahoma Crocker, M.D.    Dg Ankle Complete Right  09/17/2012  *RADIOLOGY REPORT*  Clinical Data: Fall, ankle swelling  RIGHT ANKLE - COMPLETE 3+ VIEW  Comparison: 04/26/2012  Findings: Three views of the right ankle submitted.  Again noted old fracture deformity distal fibula.  Stable metallic fixation plate and screws in distal fibula.  Significant soft tissue swelling adjacent to medial and lateral malleolus.  Again noted extensive degenerative changes tibiotalar joint.  Stable widening of the medial tibiotalar joint space.  No acute fracture or subluxation.  IMPRESSION: Again noted old fracture deformity distal fibula.  Stable metallic fixation plate and screws in distal fibula.  Significant soft tissue swelling adjacent to medial and lateral malleolus.  Again noted extensive degenerative changes tibiotalar joint.  Stable widening of the medial tibiotalar joint space.  No acute fracture or subluxation.   Original Report Authenticated By: Lahoma Crocker, M.D.    Ct Head Wo Contrast  09/17/2012  *RADIOLOGY REPORT*  Clinical Data: Unresponsive, fall  CT HEAD WITHOUT CONTRAST  Technique:  Contiguous axial images were obtained from the base of the skull through the vertex without contrast.  Comparison: 01/29/2011  Findings: No skull fracture is noted.  Extensive calcifications are noted right eye globe.  No intracranial hemorrhage, mass effect or midline shift. Paranasal sinuses and mastoid air cells are unremarkable.  No acute infarction.  No mass lesion is noted on this unenhanced scan.  IMPRESSION: No acute intracranial abnormality.   Original Report Authenticated By: Lahoma Crocker, M.D.       Assessment/Plan   1. Hepatic encephalopathy. 2. 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 3. Pancytopenia likely  secondary to #2. Severe anemia. 4. Hypertension. 5. Type 2 diabetes mellitus.  Plan: 1. Admit to medical floor. 2. 2 units blood transfusion. 3. Lactulose. 4. Gastroenterology consultation tomorrow. 5. DO NOT RESUSCITATE. Consider hospice services if patient does not improve.   Code Status: DO NOT RESUSCITATE.  Family Communication: Discussed patient's condition with the wife at the bedside.   Disposition Plan: Depends on clinical course.   Time spent: 45 minutes.  Doree Albee [facility] Pager [phone].  If 7PM-7AM, please contact night-coverage [email or web address] Password [email or web address] 09/17/2012, 3:00 PM

## 2012-09-17 NOTE — ED Notes (Signed)
Pt via EMS due to fall this morning c/o left hand and ankle pain from previous injury. Pt from home and family wanting placement in nursing home

## 2012-09-17 NOTE — ED Notes (Signed)
Patient wanted to know why he hasn't been seen yet patient made aware that hospital is extremely busy but the MD would be in as soon as possible. Dr. Bernette Mayers at bedside within five minutes.

## 2012-09-18 ENCOUNTER — Inpatient Hospital Stay (HOSPITAL_COMMUNITY): Payer: Medicare Other

## 2012-09-18 ENCOUNTER — Encounter (HOSPITAL_COMMUNITY): Payer: Self-pay | Admitting: Urgent Care

## 2012-09-18 DIAGNOSIS — K7290 Hepatic failure, unspecified without coma: Secondary | ICD-10-CM

## 2012-09-18 DIAGNOSIS — D61818 Other pancytopenia: Secondary | ICD-10-CM

## 2012-09-18 DIAGNOSIS — K7460 Unspecified cirrhosis of liver: Secondary | ICD-10-CM

## 2012-09-18 LAB — TYPE AND SCREEN
ABO/RH(D): A POS
Antibody Screen: NEGATIVE
Unit division: 0

## 2012-09-18 LAB — COMPREHENSIVE METABOLIC PANEL
ALT: 7 U/L (ref 0–53)
AST: 22 U/L (ref 0–37)
Albumin: 2.7 g/dL — ABNORMAL LOW (ref 3.5–5.2)
Alkaline Phosphatase: 105 U/L (ref 39–117)
Chloride: 106 mEq/L (ref 96–112)
Potassium: 3.4 mEq/L — ABNORMAL LOW (ref 3.5–5.1)
Sodium: 140 mEq/L (ref 135–145)
Total Bilirubin: 3.1 mg/dL — ABNORMAL HIGH (ref 0.3–1.2)

## 2012-09-18 LAB — CBC
MCH: 25 pg — ABNORMAL LOW (ref 26.0–34.0)
MCV: 81.7 fL (ref 78.0–100.0)
Platelets: 46 10*3/uL — ABNORMAL LOW (ref 150–400)
RBC: 3.44 MIL/uL — ABNORMAL LOW (ref 4.22–5.81)

## 2012-09-18 LAB — OCCULT BLOOD, POC DEVICE: Fecal Occult Bld: NEGATIVE

## 2012-09-18 LAB — URINE CULTURE

## 2012-09-18 NOTE — Evaluation (Signed)
Physical Therapy Evaluation Patient Details Name: [name] [name] MRN: [id number] DOB: 03-12-1961 Today's Date: 09/18/2012 Time: 1308-6578 PT Time Calculation (min): 49 min  PT Assessment / Plan / Recommendation Clinical Impression  Pt was seen for eval following the request of wife for pt to go to SNF at d/c.  She is having burnout in caring for husband, for whom she describes and needing total care for ADLs at home.  Currently, pt is independent with all transfers and is able to ambulate for functional distances with no LOB.  I suggested that he use a walker due to c/o pain in R foot with weight bearing.  I would like for him to use the walker when his ankle is really bothering him.  He will need more instruction for walker safety.  In my opinion, he is functioning at baseline and is not appropriate for SNF.  He would be appropriate for ACLF and I have recommended this to his wife.    PT Assessment  All further PT needs can be met in the next venue of care    Follow Up Recommendations  Home health PT    Does the patient have the potential to tolerate intense rehabilitation      Barriers to Discharge        Equipment Recommendations  None recommended by PT    Recommendations for Other Services     Frequency      Precautions / Restrictions Precautions Precautions: None Restrictions Weight Bearing Restrictions: No Other Position/Activity Restrictions: pt has a painful R ankle with weight bearing due to an old fracture   Pertinent Vitals/Pain       Mobility  Bed Mobility Bed Mobility: Supine to Sit;Sit to Supine Supine to Sit: 7: Independent Sit to Supine: 7: Independent Transfers Transfers: Sit to Stand;Stand to Sit Sit to Stand: 7: Independent;From bed;Without upper extremity assist Stand to Sit: 7: Independent;Without upper extremity assist;To bed Ambulation/Gait Ambulation/Gait Assistance: 6: Modified independent (Device/Increase time) Ambulation Distance (Feet):  200 Feet Assistive device: Straight cane;Rolling walker Ambulation/Gait Assistance Details: pt has antalgia in R ankle with gait, so I have suggested that he use a RW to off load body weight.  He is more comfortable using a walker for gait, but his abducted R foot gets tangled up with the walker leg at times.  Cognitively, he is slow to pick up on teaching, so he will need more instruction in order to become safe with a walker,  I have told him and wife that it is OK to use his cane when his ankle isn't bothering him. Gait Pattern: Within Functional Limits Gait velocity: WNK Stairs: No Wheelchair Mobility Wheelchair Mobility: No    Shoulder Instructions     Exercises     PT Diagnosis: Abnormality of gait  PT Problem List: Decreased knowledge of use of DME PT Treatment Interventions:     PT Goals    Visit Information  Last PT Received On: 09/18/12    Subjective Data  Subjective: wife states that she can no longer take care of pt at home Patient Stated Goal: wife would like pt to go to SNF at d/c   Prior Functioning  Home Living Lives With: Spouse Available Help at Discharge: Family;Available 24 hours/day Type of Home: Apartment Home Access: Level entry Home Layout: One level Home Adaptive Equipment: Walker - rolling;Straight cane Prior Function Level of Independence: Needs assistance Needs Assistance: Bathing;Dressing;Feeding;Grooming;Toileting;Meal Prep;Light Housekeeping Able to Take Stairs?: No Driving: No Vocation: Unemployed Communication Communication:  HOH    Cognition  Overall Cognitive Status: Appears within functional limits for tasks assessed/performed Arousal/Alertness: Awake/alert Orientation Level: Appears intact for tasks assessed Behavior During Session: [facility] for tasks performed Cognition - Other Comments: pt does have a dx of mental retardation on chart    Extremity/Trunk Assessment Right Lower Extremity Assessment RLE ROM/Strength/Tone: Deficits RLE  ROM/Strength/Tone Deficits: strength is 5/5 at hip and knee but pt has an old ankle fx with resultant deformity.  The foot is in abduction from neutral and he has limited PF and DF. RLE Coordination: WFL - gross motor Left Lower Extremity Assessment LLE ROM/Strength/Tone: Within functional levels LLE Sensation: WFL - Light Touch LLE Coordination: WFL - gross motor Trunk Assessment Trunk Assessment: Normal   Balance Balance Balance Assessed: No (WNL by functional observation)  End of Session PT - End of Session Equipment Utilized During Treatment: Gait belt Activity Tolerance: Patient tolerated treatment well Patient left: in bed;with call bell/phone within reach;with nursing in room Nurse Communication: Mobility status  GP     Konrad Penta 09/18/2012, 12:46 PM

## 2012-09-18 NOTE — Clinical Social Work Placement (Signed)
Clinical Social Work Department CLINICAL SOCIAL WORK PLACEMENT NOTE 09/18/2012  Patient:  [name] [name], [name] [name]  Account Number:  [id number] Admit date:  09/17/2012  Clinical Social Worker:  Derenda Fennel, LCSW  Date/time:  09/18/2012 12:55 PM  Clinical Social Work is seeking post-discharge placement for this patient at the following level of care:   ASSISTED LIVING/REST HOME   (*CSW will update this form in Epic as items are completed)   09/18/2012  Patient/family provided with [facility] Department of Clinical Social Work's list of facilities offering this level of care within the geographic area requested by the patient (or if unable, by the patient's family).  09/18/2012  Patient/family informed of their freedom to choose among providers that offer the needed level of care, that participate in Medicare, Medicaid or managed care program needed by the patient, have an available bed and are willing to accept the patient.  09/18/2012  Patient/family informed of [facility]' ownership interest in [facility], as well as of the fact that they are under no obligation to receive care at this facility.  PASARR submitted to EDS on 09/18/2012 PASARR number received from EDS on   FL2 transmitted to all facilities in geographic area requested by pt/family on  09/18/2012 FL2 transmitted to all facilities within larger geographic area on   Patient informed that his/her managed care company has contracts with or will negotiate with  certain facilities, including the following:     Patient/family informed of bed offers received:   Patient chooses bed at  Physician recommends and patient chooses bed at    Patient to be transferred to  on   Patient to be transferred to facility by   The following physician request were entered in Epic:   Additional Comments:  Derenda Fennel, LCSW [phone]

## 2012-09-18 NOTE — Clinical Social Work Psychosocial (Signed)
Clinical Social Work Department BRIEF PSYCHOSOCIAL ASSESSMENT 09/18/2012  Patient:  [name] [name], [name] [name]     Account Number:  [id number]     Admit date:  09/17/2012  Clinical Social Worker:  Nancie Neas  Date/Time:  09/18/2012 03:00 PM  Referred by:  Physician  Date Referred:  09/18/2012 Referred for  ALF Placement   Other Referral:   Interview type:  Family Other interview type:   wife- [name] [name]    PSYCHOSOCIAL DATA Living Status:  WIFE Admitted from facility:   Level of care:   Primary support name:  [name] [name] Primary support relationship to patient:  SPOUSE Degree of support available:   supportive    CURRENT CONCERNS Current Concerns  Post-Acute Placement   Other Concerns:    SOCIAL WORK ASSESSMENT / PLAN CSW met with pt's wife at bedside. Pt asleep during assessment. Pt lives with his wife who appears to be supportive, but is feeling burned out from providing care for pt at home. She reports pt needs assist with bathing, feeding, and dressing. Due to a knee surgery and back problems, she is limited in the extent of care she can provide. Pt's wife said they have been married for 8 years and she has one daughter who lives out of town. Pt's wife's brother and his wife live nearby and are able to help out as needed. Pt's wife provides transport to any appointments for pt. Pt evaluated by PT today and recommendation is for home health or ALF. CSW discussed ALF and pt's wife is wanting to pursue this. She is willing to go to DSS today to see if he can apply for Medicaid. She is very concerned about pt's prescription for Xifaxan which costs $400 out of pocket a month and how this may impact his ALF options. CSW initiated Psychologist, forensic and faxed out to [facility]. CSW provided contact information for wife to call in order to notify CSW of DSS outcome.   Assessment/plan status:  Psychosocial Support/Ongoing Assessment of Needs Other assessment/ plan:   Information/referral to community  resources:   ALF list  DSS for Medicaid    PATIENT'S/FAMILY'S RESPONSE TO PLAN OF CARE: Pt unable to discuss plan of care at this time, but pt's wife does not feel that she can continue to handle his care at home. CSW will follow up with bed offers when available.        Derenda Fennel, [place] [phone]

## 2012-09-18 NOTE — Progress Notes (Signed)
Subjective: This man is much improved since yesterday. He is alert and orientated. He did take his medicine yesterday evening. The wife told me that he has been taking lactulose at home, although she is not sure as to the dosage.           Physical Exam: Blood pressure 157/67, pulse 70, temperature 97.3 F (36.3 C), temperature source Oral, resp. rate 19, height 5\' 4"  (1.626 m), weight 92.6 kg (204 lb 2.3 oz), SpO2 98.00%. He looks more alert today. He is alert and orientated. He does not appear to be encephalopathic at this time. Abdomen is soft and nontender.   Investigations:  No results found for this or any previous visit (from the past 240 hour(s)).   Basic Metabolic Panel:  Basename 09/18/12 0409 09/17/12 1321  NA 140 138  K 3.4* 4.0  CL 106 107  CO2 24 26  GLUCOSE 85 100*  BUN 6 7  CREATININE 0.65 0.64  CALCIUM 8.6 9.0  MG -- --  PHOS -- --   Liver Function Tests:  [facility] 09/18/12 0409 09/17/12 1321  AST 22 23  ALT 7 8  ALKPHOS 105 113  BILITOT 3.1* 1.5*  PROT 6.2 6.5  ALBUMIN 2.7* 2.9*     CBC:  Basename 09/18/12 0409 09/17/12 1321  WBC 3.5* 2.6*  NEUTROABS -- 1.0*  HGB 8.6* 7.0*  HCT 28.1* 24.2*  MCV 81.7 81.8  PLT 46* 47*    Dg Chest 1 View  09/17/2012  *RADIOLOGY REPORT*  Clinical Data: Altered mental status  CHEST - 1 VIEW  Comparison: 01/28/2011  Findings: Cardiomegaly again noted.  No acute infiltrate or pulmonary edema.  Mild basilar atelectasis.  IMPRESSION: Cardiomegaly again noted.  Mild basilar atelectasis.   Original Report Authenticated By: Natasha Mead, M.D.    Dg Ankle Complete Right  09/17/2012  *RADIOLOGY REPORT*  Clinical Data: Fall, ankle swelling  RIGHT ANKLE - COMPLETE 3+ VIEW  Comparison: 04/26/2012  Findings: Three views of the right ankle submitted.  Again noted old fracture deformity distal fibula.  Stable metallic fixation plate and screws in distal fibula.  Significant soft tissue swelling adjacent to medial and  lateral malleolus.  Again noted extensive degenerative changes tibiotalar joint.  Stable widening of the medial tibiotalar joint space.  No acute fracture or subluxation.  IMPRESSION: Again noted old fracture deformity distal fibula.  Stable metallic fixation plate and screws in distal fibula.  Significant soft tissue swelling adjacent to medial and lateral malleolus.  Again noted extensive degenerative changes tibiotalar joint.  Stable widening of the medial tibiotalar joint space.  No acute fracture or subluxation.   Original Report Authenticated By: Natasha Mead, M.D.    Ct Head Wo Contrast  09/17/2012  *RADIOLOGY REPORT*  Clinical Data: Unresponsive, fall  CT HEAD WITHOUT CONTRAST  Technique:  Contiguous axial images were obtained from the base of the skull through the vertex without contrast.  Comparison: 01/29/2011  Findings: No skull fracture is noted.  Extensive calcifications are noted right eye globe.  No intracranial hemorrhage, mass effect or midline shift. Paranasal sinuses and mastoid air cells are unremarkable.  No acute infarction.  No mass lesion is noted on this unenhanced scan.  IMPRESSION: No acute intracranial abnormality.   Original Report Authenticated By: Natasha Mead, M.D.    US Abdomen Limited Ruq  09/18/2012  *RADIOLOGY REPORT*  Clinical Data:  Cirrhosis.  Liver cancer.  LIMITED ABDOMINAL ULTRASOUND - RIGHT UPPER QUADRANT  Comparison:  None.  Findings:  Gallbladder:  Cholelithiasis is present.  There is no sonographic Murphy's sign.  Gallbladder wall measures up to 6 mm.  In the setting of cirrhosis, this is nonspecific.  There is no pericholecystic fluid.  Common bile duct:  Normal at 4 mm.  Liver:  Micronodular contour of the liver and diffuse increased echogenicity compatible with hepatic cirrhosis.  No focal mass lesions identified sonographically.  The liver span the liver span is 16.5 cm.  Reversal of blood flow in the portal vein compatible with chronic portal venous hypertension.   IMPRESSION: 1.  Cholelithiasis with gallbladder wall thickening.  The gallbladder wall thickening is probably related to cirrhosis rather than chronic cholecystitis.  No acute findings in the gallbladder. 2.  Diffuse hepatic cirrhosis with micro nodular contour of the liver.  No focal mass lesion identified. 3.  Reversal of portal venous blood flow compatible with chronic portal venous hypertension from cirrhosis.                    Original Report Authenticated By: Andreas Newport, M.D.       Medications: I have reviewed the patient's current medications.  Impression: 1. Hepatic encephalopathy, improving. 2. Cirrhosis secondary to [place]. 3. Hypertension. 4. Type 2 diabetes mellitus. 5. Pancytopenia, likely secondary to #2. Anemia, status post 2 units blood transfusion.     Plan: 1. Decrease IV fluids. 2. Appreciate gastroenterology input. 3. Mobilize.     LOS: 1 day   Wilson Singer Pager [phone]  09/18/2012, 10:14 AM

## 2012-09-18 NOTE — Progress Notes (Signed)
Patient became more alert during the night.  Patient still lethargic but when he is awake, is alert and oriented and able to answer questions appropriately and follow commands.

## 2012-09-18 NOTE — Consult Note (Addendum)
Referring Provider: Dr. Karilyn Cota ([facility]) Primary Care Physician:  Mitzi Hansen, NP Primary Gastroenterologist:  Dr. Jena Gauss  Reason for Consultation:  End-stage NASH cirrhosis, hepatic encephalopathy  HPI: [name] [name] is a 52 y.o. male admitted with changes in mental status, pancytopenia & end-stage NASH cirrhosis.  He has been followed by Dr Jena Gauss for his chronic NASH cirrhosis (AMA positive) for years.  Review of records states pt became more confused over several days.  He lives with his wife who is not here currently.  He denies any abdominal pain, nausea, vomiting, jaundice or pruritis.  He denies any fever or chills.  He has had fatigue.  1st episode of encephalopathy was in 2011.  He has been intolerant of lactulose.  He has previously been unable to afford xifaxin.  He was on Hospice last Fall.  He has pancytopenia.  Hgb 7, & he received 1 unit PRBCS this admission.  He has hx portal gastropathy.  MELD was 8 back in Sept 2013.  He missed last appt with Dr Jena Gauss last week (1/24).  He started Hep A/B vaccines & he is unsure if he received last dose.  Last colonoscopy incomplete 11/2011 & pt never followed through with [facility].  Last EGD 11/2011 by Dr Jena Gauss showed mild changes of portal gastropathy, gastric/duodenal erosions s/p bx  reactive changes with mild chronic inflammation. No H.pylori.  AFP 2.6.  11/2011 ABD [place]- Cholelithiasis and mildly thickened gallbladder wall.  Negative sonographic Murphy's sign. No pericholecystic fluid.  2. Nodular echogenic liver consistent cirrhosis. There is  hepatofugal (reversed) flow within the portal vein. Splenorenal  shunt noted on comparison CT.  3. Atrophic left kidney.   Past Medical History  Diagnosis Date  . Depression   . 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 Bipolar 1 disorder   . Hypertension   . Diabetes mellitus   . Seizures   . COPD (chronic obstructive pulmonary disease)   . Mental retardation   . Blind right eye   . GERD  (gastroesophageal reflux disease)   . Chronic back pain   . Hepatic encephalopathy   . Anemia   . Thrombocytopenia   . Cardiomegaly   . Anxiety   . Arthritis   . Cholelithiasis     Asymptomatic  . Cerebrovascular disease     Right vertebral artery  . Hard of hearing   . Hyperlipidemia   . Cataract     Right eye  . Blood clots in brain     per patient  . Cirrhosis     diagnosed 07/2010 when presented with first episode of hepatic encephalopathy, afp on 416/13=4.8,  U/S on 12/14/11  liver stable, pt has received 2 hep A/B vaccines  . Diverticulosis     Past Surgical History  Procedure Date  . Tonsillectomy   . Ear operations   . Cast application 08/30/2011    Procedure: MINOR CAST APPLICATION;  Surgeon: Fuller Canada, MD;  Location: [facility];  Service: Orthopedics;  Laterality: Right;  no anesthesia , procedure room do not need or room !  . Orif ankle fracture 08/27/2011    Procedure: OPEN REDUCTION INTERNAL FIXATION (ORIF) ANKLE FRACTURE;  Surgeon: Fuller Canada, MD;  Location: [facility];  Service: Orthopedics;  Laterality: Right;  . Esophagogastroduodenoscopy 02/02/2011    Evianna Chandran-Normal esophagus.  No varices/Question mild portal gastropathy, extrinsic compression on the antrum, lesser curvature of uncertain significance, some minimally  nodular mucosa status post biopsy, patent pylorus, normal duodenum 1 and  duodenum 2.  . Colonoscopy 12/15/11    colonic divericulosis;poor prep, ACBE  recommended but has not been done  . Esophagogastroduodenoscopy 12/15/11    Rashi Giuliani-->mild changes of portal gastropathy, gastric/duodenal erosions s/p bx  (reactive changes with mild chronic inflammation. No H.pylori    Prior to Admission medications   Medication Sig Start Date End Date Taking? Authorizing Provider  ALPRAZolam Prudy Feeler) 0.5 MG tablet Take 0.5 mg by mouth 3 (three) times daily.     Yes Historical Provider, MD  aspirin EC 81 MG tablet Take 81 mg by mouth daily.   Yes Historical Provider,  MD  Cholecalciferol (VITAMIN D) 2000 UNITS tablet Take 2,000 Units by mouth daily.   Yes Historical Provider, MD  ezetimibe (ZETIA) 10 MG tablet Take 10 mg by mouth daily.     Yes Historical Provider, MD  FLUoxetine (PROZAC) 20 MG capsule Take 20 mg by mouth daily.     Yes Historical Provider, MD  furosemide (LASIX) 20 MG tablet Take 20 mg by mouth 2 (two) times daily.    Yes Historical Provider, MD  HYDROcodone-acetaminophen (VICODIN) 5-500 MG per tablet Take 1 tablet by mouth every 6 (six) hours as needed. pain 02/02/12  Yes Vickki Hearing, MD  lactulose [facility]) 10 GM/15ML solution Take 20 g by mouth 3 (three) times daily.   Yes Historical Provider, MD  levETIRAcetam (KEPPRA) 500 MG tablet Take 500 mg by mouth 2 (two) times daily.     Yes Historical Provider, MD  metoprolol (LOPRESSOR) 50 MG tablet Take 50 mg by mouth 3 (three) times daily.     Yes Historical Provider, MD  omega-3 acid ethyl esters (LOVAZA) 1 G capsule Take 1 g by mouth 2 (two) times daily.   Yes Historical Provider, MD  omeprazole (PRILOSEC) 20 MG capsule Take 20 mg by mouth daily.     Yes Historical Provider, MD  potassium chloride SA (K-DUR,KLOR-CON) 20 MEQ tablet Take 20 mEq by mouth daily.     Yes Historical Provider, MD  rifaximin (XIFAXAN) 550 MG TABS Take 550 mg by mouth 2 (two) times daily.   Yes Historical Provider, MD    Current Facility-Administered Medications  Medication Dose Route Frequency Provider Last Rate Last Dose  . 0.9 %  sodium chloride infusion   Intravenous Continuous Nimish C Karilyn Cota, MD 50 mL/hr at 09/17/12 1746    . furosemide (LASIX) tablet 20 mg  20 mg Oral BID Wilson Singer, MD   20 mg at 09/17/12 2227  . lactulose (CHRONULAC) 10 GM/15ML solution 40 g  40 g Oral TID Wilson Singer, MD   40 g at 09/17/12 2227  . levETIRAcetam (KEPPRA) tablet 500 mg  500 mg Oral BID Wilson Singer, MD   500 mg at 09/17/12 2227  . metoprolol (LOPRESSOR) tablet 50 mg  50 mg Oral TID Wilson Singer, MD    50 mg at 09/17/12 2227  . ondansetron (ZOFRAN) tablet 4 mg  4 mg Oral Q6H PRN Nimish Normajean Glasgow, MD       Or  . ondansetron (ZOFRAN) injection 4 mg  4 mg Intravenous Q6H PRN Nimish C Gosrani, MD      . pantoprazole (PROTONIX) EC tablet 40 mg  40 mg Oral Daily Nimish C Gosrani, MD      . rifaximin (XIFAXAN) tablet 550 mg  550 mg Oral BID Wilson Singer, MD        Allergies as of 09/17/2012 - Review Complete 09/17/2012  Allergen Reaction  Noted  . Penicillins Rash 02/02/2011    Family History  Problem Relation Age of Onset  . Heart failure Mother   . Thyroid disease Mother   . Cancer Father   . Asthma Brother   . Heart attack Brother   . Cirrhosis Brother     EtOH  . Colon cancer Maternal Aunt     History   Social History  . Marital Status: Married    Spouse Name: N/A    Number of Children: 0  . Years of Education: N/A   Occupational History  . disabled    Social History Main Topics  . Smoking status: Current Every Day Smoker -- 1.0 packs/day for 30 years    Types: Cigarettes  . Smokeless tobacco: Not on file     Comment: only smokes about 1-2 a day  . Alcohol Use: No  . Drug Use: No  . Sexually Active: Not on file   Other Topics Concern  . Not on file   Social History Narrative  . No narrative on file    Review of Systems: Gen: see HPI CV: Denies chest pain, angina, palpitations, syncope, orthopnea, PND, peripheral edema, and claudication. Resp: Denies dyspnea at rest, dyspnea with exercise, cough, sputum, wheezing, coughing up blood, and pleurisy. GI: Denies vomiting blood, jaundice, and fecal incontinence.   Denies dysphagia or odynophagia. GU : Denies urinary burning, blood in urine, urinary frequency, urinary hesitancy, nocturnal urination, and urinary incontinence. MS: Denies joint pain, limitation of movement, and swelling, stiffness, low back pain, extremity pain. Denies muscle weakness, cramps, atrophy.  Derm: Denies rash, itching, dry skin, hives,  moles, warts, or unhealing ulcers.  Psych: Denies depression, anxiety, memory loss, suicidal ideation, hallucinations, paranoia, and confusion. Heme: Denies bruising, bleeding, and enlarged lymph nodes. Neuro:  Denies any headaches, dizziness, paresthesias. Endo:  Denies any problems with DM, thyroid, adrenal function.  Physical Exam: Vital signs in last 24 hours: Temp:  [97.3 F (36.3 C)-98.9 F (37.2 C)] 97.3 F (36.3 C) (01/27 0634) Pulse Rate:  [54-78] 70  (01/27 0634) Resp:  [18-20] 19  (01/27 0634) BP: (133-176)/(64-97) 157/67 mmHg (01/27 0634) SpO2:  [94 %-100 %] 98 % (01/27 0634) Weight:  [204 lb 2.3 oz (92.6 kg)-230 lb (104.327 kg)] 204 lb 2.3 oz (92.6 kg) (01/26 1640) Last BM Date:  (unknown) No LMP for male patient. General:   Alert,  Chronically-ill appearing caucasian male, pleasant and cooperative in NAD Head:  Normocephalic and atraumatic. Eyes:  Sclera clear, no icterus.   Conjunctiva pink. Ears:  +HOH Nose:  No deformity, discharge, or lesions. Mouth:  No deformity or lesions,oropharynx pink & moist. Neck:  Supple; no masses or thyromegaly. Lungs:  Clear throughout to auscultation.   No wheezes, crackles, or rhonchi. No acute distress. Heart:  Regular rate and rhythm; no murmurs, clicks, rubs,  or gallops. Abdomen:  Normal bowel sounds.  No bruits.  Soft, non-tender and non-distended without masses, hepatosplenomegaly or hernias noted.  No guarding or rebound tenderness.   Rectal:  Deferred.  Msk:  Symmetrical without gross deformities. Normal posture. Pulses:  Normal pulses noted. Extremities:  Trace ankle edema.  +asterixis Neurologic:  Alert and oriented x2 Skin:  Intact without significant lesions or rashes. Lymph Nodes:  No significant cervical adenopathy. Psych:  Alert and cooperative.  Intake/Output from previous day: 01/26 0701 - 01/27 0700 In: 950 [I.V.:250; Blood:700] Out: 3950 [Urine:3950] Intake/Output this shift:    Lab Results:  Basename  09/18/12 0409 09/17/12 1321  WBC 3.5* 2.6*  HGB 8.6* 7.0*  HCT 28.1* 24.2*  PLT 46* 47*   BMET  Basename 09/18/12 0409 09/17/12 1321  NA 140 138  K 3.4* 4.0  CL 106 107  CO2 24 26  GLUCOSE 85 100*  BUN 6 7  CREATININE 0.65 0.64  CALCIUM 8.6 9.0   LFT  Basename 09/18/12 0409 09/17/12 1321  PROT 6.2 6.5  ALBUMIN 2.7* 2.9*  AST 22 23  ALT 7 8  ALKPHOS 105 113  BILITOT 3.1* 1.5*  BILIDIR -- --  IBILI -- --  LIPASE -- --  AMYLASE -- --   PT/INR  Basename 09/18/12 0409 09/17/12 1321  LABPROT 18.5* 18.9*  INR 1.59* 1.64*   Studies/Results: Dg Chest 1 View  09/17/2012  *RADIOLOGY REPORT*  Clinical Data: Altered mental status  CHEST - 1 VIEW  Comparison: 01/28/2011  Findings: Cardiomegaly again noted.  No acute infiltrate or pulmonary edema.  Mild basilar atelectasis.  IMPRESSION: Cardiomegaly again noted.  Mild basilar atelectasis.   Original Report Authenticated By: Natasha Mead, M.D.    Dg Ankle Complete Right  09/17/2012  *RADIOLOGY REPORT*  Clinical Data: Fall, ankle swelling  RIGHT ANKLE - COMPLETE 3+ VIEW  Comparison: 04/26/2012  Findings: Three views of the right ankle submitted.  Again noted old fracture deformity distal fibula.  Stable metallic fixation plate and screws in distal fibula.  Significant soft tissue swelling adjacent to medial and lateral malleolus.  Again noted extensive degenerative changes tibiotalar joint.  Stable widening of the medial tibiotalar joint space.  No acute fracture or subluxation.  IMPRESSION: Again noted old fracture deformity distal fibula.  Stable metallic fixation plate and screws in distal fibula.  Significant soft tissue swelling adjacent to medial and lateral malleolus.  Again noted extensive degenerative changes tibiotalar joint.  Stable widening of the medial tibiotalar joint space.  No acute fracture or subluxation.   Original Report Authenticated By: Natasha Mead, M.D.    Ct Head Wo Contrast  09/17/2012  *RADIOLOGY REPORT*  Clinical  Data: Unresponsive, fall  CT HEAD WITHOUT CONTRAST  Technique:  Contiguous axial images were obtained from the base of the skull through the vertex without contrast.  Comparison: 01/29/2011  Findings: No skull fracture is noted.  Extensive calcifications are noted right eye globe.  No intracranial hemorrhage, mass effect or midline shift. Paranasal sinuses and mastoid air cells are unremarkable.  No acute infarction.  No mass lesion is noted on this unenhanced scan.  IMPRESSION: No acute intracranial abnormality.   Original Report Authenticated By: Natasha Mead, M.D.     Impression: [name] [name] is a pleasant 52 y.o. male with NASH cirrhosis (AMA positive), portal gastropathy, hepatic encephalopathy, & pancytopenia.  MELD 16, 3 month mortality equates to 29% or less.  One of his biggest obstacles is compliance.  He has been unable to tolerate lactulose, unable to afford xifaxin & did not show up for last appt with Dr Jena Gauss.  Outpatient xanax & vicodin are contributing to his encephalopathy & should be discontinued.   Plan: 1. Agree with ABD [place] 2. Judicious lactulose 3. Follow up on Hep A/B vaccines to see if pt received last dose 4. Xifaxin BID 5. PPI BID 6. Consider ACBE outpatient to complete colorectal ca screening 7. Limit narcotics & anxiolytics 8. Will follow with you.   LOS: 1 day   Lorenza Burton  09/18/2012, 8:06 AM [facility]  Patient seen and examined. Agree with above assessment and recommendations.  Discussed importance  with compliance regarding medications with family members at length.

## 2012-09-18 NOTE — Progress Notes (Signed)
UR Chart Review Completed  

## 2012-09-19 DIAGNOSIS — K7460 Unspecified cirrhosis of liver: Secondary | ICD-10-CM

## 2012-09-19 DIAGNOSIS — K7290 Hepatic failure, unspecified without coma: Secondary | ICD-10-CM

## 2012-09-19 LAB — PROTIME-INR
INR: 1.76 — ABNORMAL HIGH (ref 0.00–1.49)
Prothrombin Time: 19.9 seconds — ABNORMAL HIGH (ref 11.6–15.2)

## 2012-09-19 LAB — COMPREHENSIVE METABOLIC PANEL
Alkaline Phosphatase: 108 U/L (ref 39–117)
BUN: 6 mg/dL (ref 6–23)
Chloride: 105 mEq/L (ref 96–112)
GFR calc Af Amer: >90 mL/min (ref >90)
GFR calc non Af Amer: >90 mL/min (ref >90)
Glucose, Bld: 91 mg/dL (ref 70–99)
Potassium: 3.2 mEq/L — ABNORMAL LOW (ref 3.5–5.1)
Total Bilirubin: 2.4 mg/dL — ABNORMAL HIGH (ref 0.3–1.2)
Total Protein: 6.5 g/dL (ref 6.0–8.3)

## 2012-09-19 LAB — CBC
HCT: 30.5 % — ABNORMAL LOW (ref 39.0–52.0)
Hemoglobin: 9.4 g/dL — ABNORMAL LOW (ref 13.0–17.0)
MCHC: 30.8 g/dL (ref 30.0–36.0)

## 2012-09-19 MED ORDER — POTASSIUM CHLORIDE CRYS ER 20 MEQ PO TBCR
40.0000 meq | EXTENDED_RELEASE_TABLET | Freq: Every day | ORAL | Status: DC
  Administered 2012-09-19 – 2012-09-20 (×2): 40 meq via ORAL
  Filled 2012-09-19 (×2): qty 2

## 2012-09-19 MED ORDER — TUBERCULIN PPD 5 UNIT/0.1ML ID SOLN
5.0000 [IU] | Freq: Once | INTRADERMAL | Status: AC
  Administered 2012-09-19: 5 [IU] via INTRADERMAL
  Filled 2012-09-19: qty 0.1

## 2012-09-19 MED ORDER — LACTULOSE 10 GM/15ML PO SOLN
40.0000 g | Freq: Four times a day (QID) | ORAL | Status: DC
  Administered 2012-09-19 – 2012-09-20 (×3): 40 g via ORAL
  Filled 2012-09-19 (×4): qty 60

## 2012-09-19 NOTE — Progress Notes (Signed)
REVIEWED.  

## 2012-09-19 NOTE — Clinical Documentation Improvement (Signed)
Anemia Documentation Clarification Query  THIS DOCUMENT IS NOT A PERMANENT PART OF THE MEDICAL RECORD  RESPOND TO THE THIS QUERY, FOLLOW THE INSTRUCTIONS BELOW:  1. If needed, update documentation for the patient's encounter via the notes activity.  2. Access this query again and click edit on the In [place].  3. After updating, or not, click F2 to complete all highlighted (required) fields concerning your review. Select "additional documentation in the medical record" OR "no additional documentation provided".  4. Click Sign note button.  5. The deficiency will fall out of your In Basket *Please let us know if you are not able to complete this workflow by phone or e-mail (listed below).          09/19/12  Dear Dr. Anastasio Champion Rolley Sims  In an effort to better capture your patient's severity of illness, reflect appropriate length of stay and utilization of resources, a review of the patient medical record has revealed the following indicators.    Based on your clinical judgment, please clarify and document in a progress note and/or discharge summary the clinical condition associated with the following supporting information:  In responding to this query please exercise your independent judgment.  The fact that a query is asked, does not imply that any particular answer is desired or expected.  Possible Clinical Conditions?  " Iron deficiency anemia " Chronic blood loss anemia " Anemia due to chronic disease " Anemia due to malignancy " Acute Blood Loss Anemia " Precipitous drop in Hematocrit " Other Condition____________ " Cannot Clinically Determine   Clinical Information:  Liver failure Pancytopenia  Signs and Symptoms: Weakness Diagnostics: H&H 1/26 7.0/24.2 1/27=8.6/28.1 1/28=9.4/30.5  Treatments: Transfusion: 2 units PRBC dailyH&H monitoring  You may use possible, probable, or suspect with inpatient documentation. Possible, probable, suspected diagnoses  MUST be documented at the time of discharge.  Reviewed:  no additional documentation provided  Thank You,  Estella Husk RN, MSN Clinical Documentation Specialist: Office# [phone] [place]

## 2012-09-19 NOTE — Clinical Social Work Note (Signed)
CSW spoke with two facilities this afternoon who are interested in making bed offer to pt. CSW discussed ALF further with pt's wife and her daughter. They would like a larger facility if possible. Face to face evaluation completed today for [place]. [facility] to assess pt this afternoon and will notify CSW of decision. Family report to applied for Medicaid yesterday and were told everything looked good for him to qualify. CSW to follow up tomorrow. Requested TB skin test.   Derenda Fennel, LCSW [phone]

## 2012-09-19 NOTE — Progress Notes (Signed)
Subjective: This man remains improved compared to yesterday. He apparently has not opened his bowels significantly. He wants to go home.           Physical Exam: Blood pressure 120/69, pulse 61, temperature 97.7 F (36.5 C), temperature source Oral, resp. rate 20, height 5' 4"  (1.626 m), weight 92.6 kg (204 lb 2.3 oz), SpO2 97.00%. He looks more alert today. He is alert and orientated. He does not appear to be encephalopathic at this time. Abdomen is soft and nontender.   Investigations:  Recent Results (from the past 240 hour(s))  URINE CULTURE     Status: Normal   Collection Time   09/17/12  2:05 PM      Component Value Range Status Comment   Specimen Description URINE, CATHETERIZED   Final    Special Requests NONE   Final    Culture  Setup Time 09/17/2012 23:03   Final    Colony Count NO GROWTH   Final    Culture NO GROWTH   Final    Report Status 09/18/2012 FINAL   Final      Basic Metabolic Panel:  Basename 09/19/12 0526 09/18/12 0409  NA 141 140  K 3.2* 3.4*  CL 105 106  CO2 25 24  GLUCOSE 91 85  BUN 6 6  CREATININE 0.67 0.65  CALCIUM 8.6 8.6  MG -- --  PHOS -- --   Liver Function Tests:  [facility] 09/19/12 0526 09/18/12 0409  AST 22 22  ALT 7 7  ALKPHOS 108 105  BILITOT 2.4* 3.1*  PROT 6.5 6.2  ALBUMIN 2.8* 2.7*     CBC:  Basename 09/19/12 0526 09/18/12 0409 09/17/12 1321  WBC 3.9* 3.5* --  NEUTROABS -- -- 1.0*  HGB 9.4* 8.6* --  HCT 30.5* 28.1* --  MCV 82.2 81.7 --  PLT 56* 46* --    Dg Chest 1 View  09/17/2012  *RADIOLOGY REPORT*  Clinical Data: Altered mental status  CHEST - 1 VIEW  Comparison: 01/28/2011  Findings: Cardiomegaly again noted.  No acute infiltrate or pulmonary edema.  Mild basilar atelectasis.  IMPRESSION: Cardiomegaly again noted.  Mild basilar atelectasis.   Original Report Authenticated By: Lahoma Crocker, M.D.    Dg Ankle Complete Right  09/17/2012  *RADIOLOGY REPORT*  Clinical Data: Fall, ankle swelling  RIGHT  ANKLE - COMPLETE 3+ VIEW  Comparison: 04/26/2012  Findings: Three views of the right ankle submitted.  Again noted old fracture deformity distal fibula.  Stable metallic fixation plate and screws in distal fibula.  Significant soft tissue swelling adjacent to medial and lateral malleolus.  Again noted extensive degenerative changes tibiotalar joint.  Stable widening of the medial tibiotalar joint space.  No acute fracture or subluxation.  IMPRESSION: Again noted old fracture deformity distal fibula.  Stable metallic fixation plate and screws in distal fibula.  Significant soft tissue swelling adjacent to medial and lateral malleolus.  Again noted extensive degenerative changes tibiotalar joint.  Stable widening of the medial tibiotalar joint space.  No acute fracture or subluxation.   Original Report Authenticated By: Lahoma Crocker, M.D.    Ct Head Wo Contrast  09/17/2012  *RADIOLOGY REPORT*  Clinical Data: Unresponsive, fall  CT HEAD WITHOUT CONTRAST  Technique:  Contiguous axial images were obtained from the base of the skull through the vertex without contrast.  Comparison: 01/29/2011  Findings: No skull fracture is noted.  Extensive calcifications are noted right eye globe.  No intracranial hemorrhage, mass effect or  midline shift. Paranasal sinuses and mastoid air cells are unremarkable.  No acute infarction.  No mass lesion is noted on this unenhanced scan.  IMPRESSION: No acute intracranial abnormality.   Original Report Authenticated By: Lahoma Crocker, M.D.    US Abdomen Limited Ruq  09/18/2012  *RADIOLOGY REPORT*  Clinical Data:  Cirrhosis.  Liver cancer.  LIMITED ABDOMINAL ULTRASOUND - RIGHT UPPER QUADRANT  Comparison:  None.  Findings:  Gallbladder:  Cholelithiasis is present.  There is no sonographic Murphy's sign.  Gallbladder wall measures up to 6 mm.  In the setting of cirrhosis, this is nonspecific.  There is no pericholecystic fluid.  Common bile duct:  Normal at 4 mm.  Liver:  Micronodular contour of  the liver and diffuse increased echogenicity compatible with hepatic cirrhosis.  No focal mass lesions identified sonographically.  The liver span the liver span is 16.5 cm.  Reversal of blood flow in the portal vein compatible with chronic portal venous hypertension.  IMPRESSION: 1.  Cholelithiasis with gallbladder wall thickening.  The gallbladder wall thickening is probably related to cirrhosis rather than chronic cholecystitis.  No acute findings in the gallbladder. 2.  Diffuse hepatic cirrhosis with micro nodular contour of the liver.  No focal mass lesion identified. 3.  Reversal of portal venous blood flow compatible with chronic portal venous hypertension from cirrhosis.                    Original Report Authenticated By: Dereck Ligas, M.D.       Medications: I have reviewed the patient's current medications.  Impression: 1. Hepatic encephalopathy, improving. 2. Cirrhosis secondary to [place]. 3. Hypertension. 4. Type 2 diabetes mellitus. 5. Pancytopenia, likely secondary to #2. Anemia, status post 2 units blood transfusion.     Plan: 1. Patient is now approaching medical stability. 2. Increase lactulose to 4 times a day. 3. Disposition-assisted-living facility, await bed.     LOS: 2 days   Doree Albee Pager [phone]  09/19/2012, 8:54 AM

## 2012-09-19 NOTE — Progress Notes (Signed)
PPD placed to left inner forearm.  Site due to be read on 09/21/12.

## 2012-09-19 NOTE — Progress Notes (Signed)
Subjective: Denies abdominal pain, nausea, vomiting. Does not know the year. Unable to tell me how many bowel movements he has had. States he is here  "because of my liver".   Cranston Neighbor, RN, notes NO bowel movements yesterday, +flatus.   Objective: Vital signs in last 24 hours: Temp:  [97.7 F (36.5 C)-98.6 F (37 C)] 97.7 F (36.5 C) (01/28 0527) Pulse Rate:  [61-66] 61  (01/28 0527) Resp:  [20] 20  (01/28 0527) BP: (120-137)/(69-73) 120/69 mmHg (01/28 0527) SpO2:  [97 %-98 %] 97 % (01/28 0527) Last BM Date: 09/18/12 General:   Alert to person only.  Head:  Normocephalic and atraumatic. Eyes:  No icterus Mouth:  edentulous Heart:  S1, S2 present, no murmurs noted.  Lungs: Clear to auscultation bilaterally, without wheezing, rales, or rhonchi.  Abdomen:  Bowel sounds present, soft, non-tender, non-distended.  Msk:  Symmetrical without gross deformities. Normal posture. Extremities:  Without clubbing or edema. Neurologic:  Mild asterixis  Intake/Output from previous day: 01/27 0701 - 01/28 0700 In: 720 [P.O.:720] Out: 1650 [Urine:1650] Intake/Output this shift:    Lab Results:  Basename 09/19/12 0526 09/18/12 0409 09/17/12 1321  WBC 3.9* 3.5* 2.6*  HGB 9.4* 8.6* 7.0*  HCT 30.5* 28.1* 24.2*  PLT 56* 46* 47*   BMET  Basename 09/19/12 0526 09/18/12 0409 09/17/12 1321  NA 141 140 138  K 3.2* 3.4* 4.0  CL 105 106 107  CO2 25 24 26   GLUCOSE 91 85 100*  BUN 6 6 7   CREATININE 0.67 0.65 0.64  CALCIUM 8.6 8.6 9.0   LFT  Basename 09/19/12 0526 09/18/12 0409 09/17/12 1321  PROT 6.5 6.2 6.5  ALBUMIN 2.8* 2.7* 2.9*  AST 22 22 23   ALT 7 7 8   ALKPHOS 108 105 113  BILITOT 2.4* 3.1* 1.5*  BILIDIR -- -- --  IBILI -- -- --   PT/INR  Basename 09/19/12 0526 09/18/12 0409  LABPROT 19.9* 18.5*  INR 1.76* 1.59*     Studies/Results: Dg Chest 1 View  09/17/2012  *RADIOLOGY REPORT*  Clinical Data: Altered mental status  CHEST - 1 VIEW  Comparison: 01/28/2011  Findings:  Cardiomegaly again noted.  No acute infiltrate or pulmonary edema.  Mild basilar atelectasis.  IMPRESSION: Cardiomegaly again noted.  Mild basilar atelectasis.   Original Report Authenticated By: Natasha Mead, M.D.    Dg Ankle Complete Right  09/17/2012  *RADIOLOGY REPORT*  Clinical Data: Fall, ankle swelling  RIGHT ANKLE - COMPLETE 3+ VIEW  Comparison: 04/26/2012  Findings: Three views of the right ankle submitted.  Again noted old fracture deformity distal fibula.  Stable metallic fixation plate and screws in distal fibula.  Significant soft tissue swelling adjacent to medial and lateral malleolus.  Again noted extensive degenerative changes tibiotalar joint.  Stable widening of the medial tibiotalar joint space.  No acute fracture or subluxation.  IMPRESSION: Again noted old fracture deformity distal fibula.  Stable metallic fixation plate and screws in distal fibula.  Significant soft tissue swelling adjacent to medial and lateral malleolus.  Again noted extensive degenerative changes tibiotalar joint.  Stable widening of the medial tibiotalar joint space.  No acute fracture or subluxation.   Original Report Authenticated By: Natasha Mead, M.D.    Ct Head Wo Contrast  09/17/2012  *RADIOLOGY REPORT*  Clinical Data: Unresponsive, fall  CT HEAD WITHOUT CONTRAST  Technique:  Contiguous axial images were obtained from the base of the skull through the vertex without contrast.  Comparison: 01/29/2011  Findings: No skull fracture  is noted.  Extensive calcifications are noted right eye globe.  No intracranial hemorrhage, mass effect or midline shift. Paranasal sinuses and mastoid air cells are unremarkable.  No acute infarction.  No mass lesion is noted on this unenhanced scan.  IMPRESSION: No acute intracranial abnormality.   Original Report Authenticated By: Natasha Mead, M.D.    US Abdomen Limited Ruq  09/18/2012  *RADIOLOGY REPORT*  Clinical Data:  Cirrhosis.  Liver cancer.  LIMITED ABDOMINAL ULTRASOUND - RIGHT  UPPER QUADRANT  Comparison:  None.  Findings:  Gallbladder:  Cholelithiasis is present.  There is no sonographic Murphy's sign.  Gallbladder wall measures up to 6 mm.  In the setting of cirrhosis, this is nonspecific.  There is no pericholecystic fluid.  Common bile duct:  Normal at 4 mm.  Liver:  Micronodular contour of the liver and diffuse increased echogenicity compatible with hepatic cirrhosis.  No focal mass lesions identified sonographically.  The liver span the liver span is 16.5 cm.  Reversal of blood flow in the portal vein compatible with chronic portal venous hypertension.  IMPRESSION: 1.  Cholelithiasis with gallbladder wall thickening.  The gallbladder wall thickening is probably related to cirrhosis rather than chronic cholecystitis.  No acute findings in the gallbladder. 2.  Diffuse hepatic cirrhosis with micro nodular contour of the liver.  No focal mass lesion identified. 3.  Reversal of portal venous blood flow compatible with chronic portal venous hypertension from cirrhosis.                    Original Report Authenticated By: Andreas Newport, M.D.     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 [place] without evidence of HCC. Per nursing, pt has been taking lactulose without complaints; however, no BM yesterday. May need to increase to QID.  Plan: Continue Xifaxin BID, consider increasing lactulose to QID Continue PPI BID Limit narcotics [place] of abd and AFP in 6 months Outpatient follow-up to discussed ACBE due to incomplete colonoscopy April 2013.    LOS: 2 days   Gerrit Halls  09/19/2012, 7:56 AM

## 2012-09-20 ENCOUNTER — Telehealth: Payer: Self-pay

## 2012-09-20 LAB — CBC
HCT: 31.9 % — ABNORMAL LOW (ref 39.0–52.0)
Hemoglobin: 9.6 g/dL — ABNORMAL LOW (ref 13.0–17.0)
MCH: 25.1 pg — ABNORMAL LOW (ref 26.0–34.0)
RBC: 3.83 MIL/uL — ABNORMAL LOW (ref 4.22–5.81)

## 2012-09-20 LAB — COMPREHENSIVE METABOLIC PANEL
ALT: 8 U/L (ref 0–53)
Alkaline Phosphatase: 109 U/L (ref 39–117)
BUN: 7 mg/dL (ref 6–23)
CO2: 27 mEq/L (ref 19–32)
GFR calc Af Amer: >90 mL/min (ref >90)
GFR calc non Af Amer: >90 mL/min (ref >90)
Glucose, Bld: 89 mg/dL (ref 70–99)
Potassium: 3.1 mEq/L — ABNORMAL LOW (ref 3.5–5.1)
Sodium: 139 mEq/L (ref 135–145)

## 2012-09-20 LAB — PROTIME-INR: Prothrombin Time: 19.5 seconds — ABNORMAL HIGH (ref 11.6–15.2)

## 2012-09-20 MED ORDER — FLUOXETINE HCL 20 MG PO CAPS: 20.0000 mg | ORAL_CAPSULE | Freq: Every day | ORAL | Status: DC

## 2012-09-20 MED ORDER — EZETIMIBE 10 MG PO TABS: 10.0000 mg | ORAL_TABLET | Freq: Every day | ORAL | Status: DC

## 2012-09-20 MED ORDER — LACTULOSE 10 GM/15ML PO SOLN: ORAL | Status: DC

## 2012-09-20 MED ORDER — LEVETIRACETAM 500 MG PO TABS: 500.0000 mg | ORAL_TABLET | Freq: Two times a day (BID) | ORAL | Status: DC

## 2012-09-20 MED ORDER — POTASSIUM CHLORIDE CRYS ER 20 MEQ PO TBCR: 20.0000 meq | EXTENDED_RELEASE_TABLET | Freq: Every day | ORAL | Status: DC

## 2012-09-20 MED ORDER — METOPROLOL TARTRATE 50 MG PO TABS: 50.0000 mg | ORAL_TABLET | Freq: Three times a day (TID) | ORAL | Status: DC

## 2012-09-20 MED ORDER — FUROSEMIDE 20 MG PO TABS: 20.0000 mg | ORAL_TABLET | Freq: Two times a day (BID) | ORAL | Status: DC

## 2012-09-20 MED ORDER — OMEGA-3-ACID ETHYL ESTERS 1 G PO CAPS: 1.0000 g | ORAL_CAPSULE | Freq: Two times a day (BID) | ORAL | Status: DC

## 2012-09-20 MED ORDER — OMEPRAZOLE 20 MG PO CPDR: 20.0000 mg | DELAYED_RELEASE_CAPSULE | Freq: Every day | ORAL | Status: DC

## 2012-09-20 MED ORDER — RIFAXIMIN 550 MG PO TABS: 550.0000 mg | ORAL_TABLET | Freq: Two times a day (BID) | ORAL | Status: DC

## 2012-09-20 MED ORDER — VITAMIN D 50 MCG (2000 UT) PO TABS: 2000.0000 [IU] | ORAL_TABLET | Freq: Every day | ORAL | Status: DC

## 2012-09-20 MED ORDER — LACTULOSE 10 GM/15ML PO SOLN: 40.0000 g | Freq: Three times a day (TID) | ORAL | Status: DC

## 2012-09-20 MED ORDER — POTASSIUM CHLORIDE CRYS ER 20 MEQ PO TBCR: 40.0000 meq | EXTENDED_RELEASE_TABLET | Freq: Once | ORAL | Status: DC

## 2012-09-20 NOTE — Telephone Encounter (Signed)
T/C from Lorenza Burton, NP to give pt Xifaxan 550 mg tablets to take bid. # 22 tablets left at the front for pick up.

## 2012-09-20 NOTE — Care Management Note (Unsigned)
    Page 1 of 1   09/20/2012     2:39:31 PM   CARE MANAGEMENT NOTE 09/20/2012  Patient:  Medora, DUCRE   Account Number:  [id number]  Date Initiated:  09/19/2012  Documentation initiated by:  Rosemary Holms  Subjective/Objective Assessment:   Pt admitted from home where he lives with his spouse. 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 Plan to DC to ALF     Action/Plan:   Anticipated DC Date:  09/20/2012   Anticipated DC Plan:  HOME/SELF CARE  In-house referral  Clinical Social Worker      DC Planning Services  CM consult      Choice offered to / List presented to:          [facility] arranged  [facility] PT      [facility] agency  [facility]   Status of service:  Completed, signed off Medicare Important Message given?   (If response is "NO", the following Medicare IM given date fields will be blank) Date Medicare IM given:   Date Additional Medicare IM given:    Discharge Disposition:  HOME W HOME HEALTH SERVICES  Per UR Regulation:    If discussed at Long Length of Stay Meetings, dates discussed:    Comments:  09/20/12 Rosemary Holms RN BSN CM Pt refused to go to ALF. Pt to DC home with wife with [facility] PT.  09/19/12 Rosemary Holms RN BSN CM

## 2012-09-20 NOTE — Telephone Encounter (Signed)
Pt's wife not here today.  Please call her & arrange pick-up. Thanks

## 2012-09-20 NOTE — Clinical Social Work Placement (Signed)
Clinical Social Work Department CLINICAL SOCIAL WORK PLACEMENT NOTE 09/20/2012  Patient:  LIGE, LAKEMAN  Account Number:  [id number] Admit date:  09/17/2012  Clinical Social Worker:  Derenda Fennel, LCSW  Date/time:  09/18/2012 12:55 PM  Clinical Social Work is seeking post-discharge placement for this patient at the following level of care:   ASSISTED LIVING/REST HOME   (*CSW will update this form in Epic as items are completed)   09/18/2012  Patient/family provided with [facility] Department of Clinical Social Work's list of facilities offering this level of care within the geographic area requested by the patient (or if unable, by the patient's family).  09/18/2012  Patient/family informed of their freedom to choose among providers that offer the needed level of care, that participate in Medicare, Medicaid or managed care program needed by the patient, have an available bed and are willing to accept the patient.  09/18/2012  Patient/family informed of [facility]' ownership interest in [facility], as well as of the fact that they are under no obligation to receive care at this facility.  PASARR submitted to EDS on 09/18/2012 PASARR number received from EDS on 09/20/2012  FL2 transmitted to all facilities in geographic area requested by pt/family on  09/18/2012 FL2 transmitted to all facilities within larger geographic area on   Patient informed that his/her managed care company has contracts with or will negotiate with  certain facilities, including the following:     Patient/family informed of bed offers received:  09/20/2012 Patient chooses bed at [facility] Physician recommends and patient chooses bed at  [facility]  Patient to be transferred to [facility] on  09/20/2012 Patient to be transferred to facility by facility Zenaida Niece  The following physician request were entered in Epic:   Additional  Comments:  Derenda Fennel, LCSW [phone]

## 2012-09-20 NOTE — Clinical Social Work Note (Addendum)
CSW presented bed offer to pt's wife of [facility] and she accepts. Discussed with pt as well. Pasarr received this morning. Pt to d/c today and facility will provide transport. D/C summary and FL2 faxed. Darel Hong at facility aware TB skin test given yesterday to be read tomorrow.   Derenda Fennel, [place] [phone]

## 2012-09-20 NOTE — Plan of Care (Signed)
Problem: Discharge Progression Outcomes Goal: Other Discharge Outcomes/Goals Outcome: Completed/Met Date Met:  09/20/12 Pt decided to be d/c home with wife instead of going to assisted living facility

## 2012-09-20 NOTE — Progress Notes (Signed)
UR Chart Review Completed  

## 2012-09-20 NOTE — Telephone Encounter (Signed)
Called pt's wife. She said it might be tomorrow before she can get it because of her roads.

## 2012-09-20 NOTE — Plan of Care (Signed)
Problem: Discharge Progression Outcomes Goal: Discharge plan in place and appropriate Outcome: Progressing Plan to d/c to assisted living  Goal: Activity appropriate for discharge plan Outcome: Progressing Up with assist using walker

## 2012-09-20 NOTE — Progress Notes (Signed)
Subjective: Pt denies any abdominal pain.  More alert today.  No concerns.  Refused a dose of lactulose yesterday.  2 BMs last night.  Objective: Vital signs in last 24 hours: Temp:  [97.7 F (36.5 C)-98.4 F (36.9 C)] 97.7 F (36.5 C) (01/29 0430) Pulse Rate:  [58-67] 58  (01/29 0430) Resp:  [16-20] 16  (01/29 0430) BP: (123-151)/(50-76) 134/50 mmHg (01/29 0430) SpO2:  [97 %-99 %] 99 % (01/28 2127) Weight:  [189 lb 6 oz (85.9 kg)] 189 lb 6 oz (85.9 kg) (01/29 0430) Last BM Date: 09/19/12 No LMP for male patient. Body mass index is 32.51 kg/(m^2). General:   Alert,  pleasant and cooperative in NAD Eyes:  Sclera clear, no icterus.   Conjunctiva pink. Mouth:   oropharynx pink & moist. Heart:  Regular rate and rhythm Abdomen:   Normal bowel sounds.  Soft, nontender and nondistended. No tense ascites. Msk:  Symmetrical without gross deformities.. Extremities:  Without edema. Neurologic:  Alert and  oriented x2, at his baseline as he is mentally challenged Psych:  Alert and cooperative.   Intake/Output from previous day: 01/28 0701 - 01/29 0700 In: 480 [P.O.:480] Out: -   Lab Results:  Basename 09/20/12 0430 09/19/12 0526 09/18/12 0409  WBC 4.5 3.9* 3.5*  HGB 9.6* 9.4* 8.6*  HCT 31.9* 30.5* 28.1*  PLT 58* 56* 46*   BMET  Basename 09/20/12 0430 09/19/12 0526 09/18/12 0409  NA 139 141 140  K 3.1* 3.2* 3.4*  CL 105 105 106  CO2 27 25 24   GLUCOSE 89 91 85  BUN 7 6 6   CREATININE 0.64 0.67 0.65  CALCIUM 8.7 8.6 8.6   LFT  Basename 09/20/12 0430 09/19/12 0526 09/18/12 0409  PROT 6.5 6.5 6.2  ALBUMIN 2.9* 2.8* 2.7*  AST 24 22 22   ALT 8 7 7   ALKPHOS 109 108 105  BILITOT 1.8* 2.4* 3.1*  BILIDIR -- -- --  IBILI -- -- --  LIPASE -- -- --  AMYLASE -- -- --   PT/INR  Basename 09/20/12 0430 09/19/12 0526  LABPROT 19.5* 19.9*  INR 1.71* 1.76*   Assessment: 1. Hepatic encephalopathy:  Much improved, however inadequate BMs secondary to lactulose refusal 2. NASH  cirrhosis: MELD 10 ([facility]) 15 ([facility]) 3. Pancytopenia:  Stable,  Plan: 1. Continue Xifaxin 550mg  BID.  We have #14 samples to be picked up at our office.  Will need prior auth. 2. Continue lactulose QID, titrate to 3-4 BMs daily, call if no results 3. Continue PPI BID 4. Limit narcotics/anxiolytics 5. [place] of abd and AFP in 6 months 6. Outpatient follow-up in 4 weeks for follow up & to discussed ACBE due to incomplete colonoscopy April 2013.    LOS: 3 days   Lorenza Burton  09/20/2012, 11:14 AM

## 2012-09-20 NOTE — Discharge Summary (Signed)
Physician Discharge Summary  [name] [name]:[id number] DOB: 02-Mar-1961 DOA: 09/17/2012  PCP: Mitzi Hansen, NP  Admit date: 09/17/2012 Discharge date: 09/20/2012  Time spent: Greater than 30 minutes  Recommendations for Outpatient Follow-up:  1. Follow with Dr. Kendell Bane in the next 4 weeks.  Discharge Diagnoses:  1. Hepatic encephalopathy, improved. 2. 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 3. 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 4. Type 2 diabetes mellitus. 5. Hypertension. 6. History of noncompliance.   Discharge Condition: Stable and improved.  Diet recommendation: Carbohydrate modified diet.  Filed Weights   09/17/12 1131 09/17/12 1640 09/20/12 0430  Weight: 104.327 kg (230 lb) 92.6 kg (204 lb 2.3 oz) 85.9 kg (189 lb 6 oz)    History of present illness:  This 52 year old man presents to the hospital with symptoms of altered mental status. Please see initial history as outlined below: HPI: [name] [name] is a 52 y.o. male presents with altered mental status for the last week.Has h/o cirrhosis of liver secondary to NASH.Wife unable to take care of him at home.No evidence of GI bleeding.  PCP had previously referred patient to hospice  Hospital Course:  The patient was admitted and initially it was fairly unarousable. He was encephalopathic. In the following 24 hours he improved significantly, start taking his lactulose and appears to be back to his baseline. After further discussions with the family, especially the wife, who is the primary caregiver, it has been decided that he would benefit from placement in an assisted living facility. He does not qualify for skilled nursing facility. Fortunately, he does qualify for an assisted living facility from also a financial standpoint. His main problem has been with compliance with medications such as lactulose. Towards the end of the hospitalization his white blood cell count had returned to normal at 4.5 and his  platelet count was improving at 58. Hemoglobin was 9.6. His bilirubin also had been decreasing to 1.8 after it reached a maximum of 3.1 couple of days ago. There is no evidence of urine infection or any other infection to precipitate the hepatic encephalopathy. He is now stable for discharge.  Procedures:  None.   Consultations:  Gastroenterology, Dr. Kendell Bane.  Discharge Exam: Filed Vitals:   09/19/12 1625 09/19/12 2127 09/19/12 2140 09/20/12 0430  BP:  151/76  134/50  Pulse: 58 58 64 58  Temp:  98.4 F (36.9 C)  97.7 F (36.5 C)  TempSrc:  Oral  Oral  Resp:  18  16  Height:      Weight:    85.9 kg (189 lb 6 oz)  SpO2: 98% 99%      General: He does look somewhat chronically sick but is alert and responds appropriately. Cardiovascular: Heart sounds are present without gallop rhythm or murmurs. Respiratory: Lung fields are clear. Abdomen is soft and nontender. There are no masses felt. He is no longer delirious/confused.  Discharge Instructions  Discharge Orders    Future Orders Please Complete By Expires   Diet - low sodium heart healthy      Increase activity slowly          Medication List     As of 09/20/2012 11:06 AM    STOP taking these medications         ALPRAZolam 0.5 MG tablet   Commonly known as: XANAX      aspirin EC 81 MG tablet      HYDROcodone-acetaminophen 5-500 MG per tablet   Commonly known as: VICODIN  TAKE these medications         ezetimibe 10 MG tablet   Commonly known as: ZETIA   Take 10 mg by mouth daily.      FLUoxetine 20 MG capsule   Commonly known as: PROZAC   Take 20 mg by mouth daily.      furosemide 20 MG tablet   Commonly known as: LASIX   Take 20 mg by mouth 2 (two) times daily.      lactulose 10 GM/15ML solution   Commonly known as: CHRONULAC   Titrate for 3-4 bowel motions daily.      levETIRAcetam 500 MG tablet   Commonly known as: KEPPRA   Take 500 mg by mouth 2 (two) times daily.      metoprolol 50 MG  tablet   Commonly known as: LOPRESSOR   Take 50 mg by mouth 3 (three) times daily.      omega-3 acid ethyl esters 1 G capsule   Commonly known as: LOVAZA   Take 1 g by mouth 2 (two) times daily.      omeprazole 20 MG capsule   Commonly known as: PRILOSEC   Take 20 mg by mouth daily.      potassium chloride SA 20 MEQ tablet   Commonly known as: K-DUR,KLOR-CON   Take 20 mEq by mouth daily.      rifaximin 550 MG Tabs   Commonly known as: XIFAXAN   Take 1 tablet (550 mg total) by mouth 2 (two) times daily.      Vitamin D 2000 UNITS tablet   Take 2,000 Units by mouth daily.          The results of significant diagnostics from this hospitalization (including imaging, microbiology, ancillary and laboratory) are listed below for reference.    Significant Diagnostic Studies: Dg Chest 1 View  09/17/2012  *RADIOLOGY REPORT*  Clinical Data: Altered mental status  CHEST - 1 VIEW  Comparison: 01/28/2011  Findings: Cardiomegaly again noted.  No acute infiltrate or pulmonary edema.  Mild basilar atelectasis.  IMPRESSION: Cardiomegaly again noted.  Mild basilar atelectasis.   Original Report Authenticated By: Natasha Mead, M.D.    Dg Ankle Complete Right  09/17/2012  *RADIOLOGY REPORT*  Clinical Data: Fall, ankle swelling  RIGHT ANKLE - COMPLETE 3+ VIEW  Comparison: 04/26/2012  Findings: Three views of the right ankle submitted.  Again noted old fracture deformity distal fibula.  Stable metallic fixation plate and screws in distal fibula.  Significant soft tissue swelling adjacent to medial and lateral malleolus.  Again noted extensive degenerative changes tibiotalar joint.  Stable widening of the medial tibiotalar joint space.  No acute fracture or subluxation.  IMPRESSION: Again noted old fracture deformity distal fibula.  Stable metallic fixation plate and screws in distal fibula.  Significant soft tissue swelling adjacent to medial and lateral malleolus.  Again noted extensive degenerative changes  tibiotalar joint.  Stable widening of the medial tibiotalar joint space.  No acute fracture or subluxation.   Original Report Authenticated By: Natasha Mead, M.D.    Ct Head Wo Contrast  09/17/2012  *RADIOLOGY REPORT*  Clinical Data: Unresponsive, fall  CT HEAD WITHOUT CONTRAST  Technique:  Contiguous axial images were obtained from the base of the skull through the vertex without contrast.  Comparison: 01/29/2011  Findings: No skull fracture is noted.  Extensive calcifications are noted right eye globe.  No intracranial hemorrhage, mass effect or midline shift. Paranasal sinuses and mastoid air cells are unremarkable.  No acute  infarction.  No mass lesion is noted on this unenhanced scan.  IMPRESSION: No acute intracranial abnormality.   Original Report Authenticated By: Natasha Mead, M.D.    US Abdomen Limited Ruq  09/18/2012  *RADIOLOGY REPORT*  Clinical Data:  Cirrhosis.  Liver cancer.  LIMITED ABDOMINAL ULTRASOUND - RIGHT UPPER QUADRANT  Comparison:  None.  Findings:  Gallbladder:  Cholelithiasis is present.  There is no sonographic Murphy's sign.  Gallbladder wall measures up to 6 mm.  In the setting of cirrhosis, this is nonspecific.  There is no pericholecystic fluid.  Common bile duct:  Normal at 4 mm.  Liver:  Micronodular contour of the liver and diffuse increased echogenicity compatible with hepatic cirrhosis.  No focal mass lesions identified sonographically.  The liver span the liver span is 16.5 cm.  Reversal of blood flow in the portal vein compatible with chronic portal venous hypertension.  IMPRESSION: 1.  Cholelithiasis with gallbladder wall thickening.  The gallbladder wall thickening is probably related to cirrhosis rather than chronic cholecystitis.  No acute findings in the gallbladder. 2.  Diffuse hepatic cirrhosis with micro nodular contour of the liver.  No focal mass lesion identified. 3.  Reversal of portal venous blood flow compatible with chronic portal venous hypertension from  cirrhosis.                    Original Report Authenticated By: Andreas Newport, M.D.     Microbiology: Recent Results (from the past 240 hour(s))  URINE CULTURE     Status: Normal   Collection Time   09/17/12  2:05 PM      Component Value Range Status Comment   Specimen Description URINE, CATHETERIZED   Final    Special Requests NONE   Final    Culture  Setup Time 09/17/2012 23:03   Final    Colony Count NO GROWTH   Final    Culture NO GROWTH   Final    Report Status 09/18/2012 FINAL   Final      Labs: Basic Metabolic Panel:  Lab 09/20/12 9604 09/19/12 0526 09/18/12 0409 09/17/12 1321  NA 139 141 140 138  K 3.1* 3.2* 3.4* 4.0  CL 105 105 106 107  CO2 27 25 24 26   GLUCOSE 89 91 85 100*  BUN 7 6 6 7   CREATININE 0.64 0.67 0.65 0.64  CALCIUM 8.7 8.6 8.6 9.0  MG -- -- -- --  PHOS -- -- -- --   Liver Function Tests:  Lab 09/20/12 0430 09/19/12 0526 09/18/12 0409 09/17/12 1321  AST 24 22 22 23   ALT 8 7 7 8   ALKPHOS 109 108 105 113  BILITOT 1.8* 2.4* 3.1* 1.5*  PROT 6.5 6.5 6.2 6.5  ALBUMIN 2.9* 2.8* 2.7* 2.9*     Lab 09/17/12 1325  AMMONIA 112*   CBC:  Lab 09/20/12 0430 09/19/12 0526 09/18/12 0409 09/17/12 1321  WBC 4.5 3.9* 3.5* 2.6*  NEUTROABS -- -- -- 1.0*  HGB 9.6* 9.4* 8.6* 7.0*  HCT 31.9* 30.5* 28.1* 24.2*  MCV 83.3 82.2 81.7 81.8  PLT 58* 56* 46* 47*   Cardiac Enzymes:  Lab 09/17/12 1321  CKTOTAL --  CKMB --  CKMBINDEX --  TROPONINI <0.30         Signed:  Torra Pala C  [facility] 09/20/2012, 11:06 AM

## 2012-09-20 NOTE — Clinical Social Work Note (Signed)
CSW discussed going to ALF with pt. He states he does not want to go and is not willing to try it out. Pt said he would go home. CSW spoke to MD who feels pt has capacity. Called pt's wife and explained to her. She is willing for him to come home and will arrange transportation this afternoon. [facility] notified. Pt's wife aware to contact [facility] if they decide to consider ALF again.  Derenda Fennel, [place] [phone]

## 2012-10-18 ENCOUNTER — Ambulatory Visit: Payer: Medicare Other | Admitting: Gastroenterology

## 2012-10-18 ENCOUNTER — Telehealth: Payer: Self-pay | Admitting: Gastroenterology

## 2012-10-18 NOTE — Telephone Encounter (Signed)
Pt was a no show

## 2012-12-11 ENCOUNTER — Telehealth: Payer: Self-pay | Admitting: Internal Medicine

## 2012-12-11 NOTE — Telephone Encounter (Signed)
Patient is to have ultrasound in April

## 2012-12-11 NOTE — Telephone Encounter (Signed)
Patient had an Abd U/S on 09/17/12

## 2013-01-31 ENCOUNTER — Inpatient Hospital Stay (HOSPITAL_COMMUNITY)
Admission: EM | Admit: 2013-01-31 | Discharge: 2013-02-02 | DRG: 442 | Disposition: A | Discharge status: Home / Self Care | Payer: Medicare Other | Attending: Internal Medicine | Admitting: Internal Medicine

## 2013-01-31 ENCOUNTER — Emergency Department (HOSPITAL_COMMUNITY): Payer: Medicare Other

## 2013-01-31 ENCOUNTER — Encounter (HOSPITAL_COMMUNITY): Payer: Self-pay | Admitting: Emergency Medicine

## 2013-01-31 DIAGNOSIS — R51 Headache: Secondary | ICD-10-CM | POA: Diagnosis present

## 2013-01-31 DIAGNOSIS — K920 Hematemesis: Secondary | ICD-10-CM

## 2013-01-31 DIAGNOSIS — E785 Hyperlipidemia, unspecified: Secondary | ICD-10-CM | POA: Diagnosis present

## 2013-01-31 DIAGNOSIS — E7220 Disorder of urea cycle metabolism, unspecified: Secondary | ICD-10-CM | POA: Diagnosis present

## 2013-01-31 DIAGNOSIS — R791 Abnormal coagulation profile: Secondary | ICD-10-CM | POA: Diagnosis present

## 2013-01-31 DIAGNOSIS — J4489 Other specified chronic obstructive pulmonary disease: Secondary | ICD-10-CM | POA: Diagnosis present

## 2013-01-31 DIAGNOSIS — G40909 Epilepsy, unspecified, not intractable, without status epilepticus: Secondary | ICD-10-CM | POA: Diagnosis present

## 2013-01-31 DIAGNOSIS — Z720 Tobacco use: Secondary | ICD-10-CM | POA: Diagnosis present

## 2013-01-31 DIAGNOSIS — F329 Major depressive disorder, single episode, unspecified: Secondary | ICD-10-CM

## 2013-01-31 DIAGNOSIS — D61818 Other pancytopenia: Secondary | ICD-10-CM | POA: Diagnosis present

## 2013-01-31 DIAGNOSIS — D649 Anemia, unspecified: Secondary | ICD-10-CM

## 2013-01-31 DIAGNOSIS — K219 Gastro-esophageal reflux disease without esophagitis: Secondary | ICD-10-CM

## 2013-01-31 DIAGNOSIS — Z9181 History of falling: Secondary | ICD-10-CM

## 2013-01-31 DIAGNOSIS — R32 Unspecified urinary incontinence: Secondary | ICD-10-CM | POA: Diagnosis present

## 2013-01-31 DIAGNOSIS — F319 Bipolar disorder, unspecified: Secondary | ICD-10-CM | POA: Diagnosis present

## 2013-01-31 DIAGNOSIS — F79 Unspecified intellectual disabilities: Secondary | ICD-10-CM | POA: Diagnosis present

## 2013-01-31 DIAGNOSIS — E876 Hypokalemia: Secondary | ICD-10-CM | POA: Diagnosis not present

## 2013-01-31 DIAGNOSIS — F17200 Nicotine dependence, unspecified, uncomplicated: Secondary | ICD-10-CM | POA: Diagnosis present

## 2013-01-31 DIAGNOSIS — F411 Generalized anxiety disorder: Secondary | ICD-10-CM | POA: Diagnosis present

## 2013-01-31 DIAGNOSIS — I10 Essential (primary) hypertension: Secondary | ICD-10-CM

## 2013-01-31 DIAGNOSIS — H5440 Blindness, one eye, unspecified eye: Secondary | ICD-10-CM | POA: Diagnosis present

## 2013-01-31 DIAGNOSIS — E119 Type 2 diabetes mellitus without complications: Secondary | ICD-10-CM | POA: Diagnosis present

## 2013-01-31 DIAGNOSIS — K7682 Hepatic encephalopathy: Principal | ICD-10-CM | POA: Diagnosis present

## 2013-01-31 DIAGNOSIS — Z91199 Patient's noncompliance with other medical treatment and regimen due to unspecified reason: Secondary | ICD-10-CM

## 2013-01-31 DIAGNOSIS — Z9119 Patient's noncompliance with other medical treatment and regimen: Secondary | ICD-10-CM

## 2013-01-31 DIAGNOSIS — Z880 Allergy status to penicillin: Secondary | ICD-10-CM

## 2013-01-31 DIAGNOSIS — I679 Cerebrovascular disease, unspecified: Secondary | ICD-10-CM | POA: Diagnosis present

## 2013-01-31 DIAGNOSIS — D696 Thrombocytopenia, unspecified: Secondary | ICD-10-CM

## 2013-01-31 DIAGNOSIS — R600 Localized edema: Secondary | ICD-10-CM

## 2013-01-31 DIAGNOSIS — M129 Arthropathy, unspecified: Secondary | ICD-10-CM | POA: Diagnosis present

## 2013-01-31 DIAGNOSIS — K7290 Hepatic failure, unspecified without coma: Principal | ICD-10-CM | POA: Diagnosis present

## 2013-01-31 DIAGNOSIS — D689 Coagulation defect, unspecified: Secondary | ICD-10-CM | POA: Diagnosis present

## 2013-01-31 DIAGNOSIS — J449 Chronic obstructive pulmonary disease, unspecified: Secondary | ICD-10-CM | POA: Diagnosis present

## 2013-01-31 DIAGNOSIS — F32A Depression, unspecified: Secondary | ICD-10-CM

## 2013-01-31 DIAGNOSIS — K7460 Unspecified cirrhosis of liver: Secondary | ICD-10-CM | POA: Diagnosis present

## 2013-01-31 LAB — URINALYSIS, ROUTINE W REFLEX MICROSCOPIC
Bilirubin Urine: NEGATIVE
Glucose, UA: NEGATIVE mg/dL
Hgb urine dipstick: NEGATIVE
Ketones, ur: NEGATIVE mg/dL
Leukocytes, UA: NEGATIVE
Nitrite: NEGATIVE
Protein, ur: NEGATIVE mg/dL
Specific Gravity, Urine: 1.02 (ref 1.005–1.030)
Urobilinogen, UA: 4 mg/dL — ABNORMAL HIGH (ref 0.0–1.0)
pH: 7 (ref 5.0–8.0)

## 2013-01-31 LAB — CBC WITH DIFFERENTIAL/PLATELET
Basophils Absolute: 0 10*3/uL (ref 0.0–0.1)
Basophils Relative: 1 % (ref 0–1)
Eosinophils Absolute: 0.1 10*3/uL (ref 0.0–0.7)
Eosinophils Relative: 2 % (ref 0–5)
HCT: 32.6 % — ABNORMAL LOW (ref 39.0–52.0)
Hemoglobin: 10.3 g/dL — ABNORMAL LOW (ref 13.0–17.0)
Lymphocytes Relative: 36 % (ref 12–46)
Lymphs Abs: 1 10*3/uL (ref 0.7–4.0)
MCH: 26.9 pg (ref 26.0–34.0)
MCHC: 31.6 g/dL (ref 30.0–36.0)
MCV: 85.1 fL (ref 78.0–100.0)
Monocytes Absolute: 0.4 10*3/uL (ref 0.1–1.0)
Monocytes Relative: 14 % — ABNORMAL HIGH (ref 3–12)
Neutro Abs: 1.4 10*3/uL — ABNORMAL LOW (ref 1.7–7.7)
Neutrophils Relative %: 47 % (ref 43–77)
Platelets: 72 10*3/uL — ABNORMAL LOW (ref 150–400)
RBC: 3.83 MIL/uL — ABNORMAL LOW (ref 4.22–5.81)
RDW: 18.1 % — ABNORMAL HIGH (ref 11.5–15.5)
WBC: 2.9 10*3/uL — ABNORMAL LOW (ref 4.0–10.5)

## 2013-01-31 LAB — COMPREHENSIVE METABOLIC PANEL
ALT: 14 U/L (ref 0–53)
AST: 36 U/L (ref 0–37)
Albumin: 3.1 g/dL — ABNORMAL LOW (ref 3.5–5.2)
Alkaline Phosphatase: 140 U/L — ABNORMAL HIGH (ref 39–117)
BUN: 11 mg/dL (ref 6–23)
CO2: 24 mEq/L (ref 19–32)
Calcium: 9.1 mg/dL (ref 8.4–10.5)
Chloride: 102 mEq/L (ref 96–112)
Creatinine, Ser: 0.64 mg/dL (ref 0.50–1.35)
GFR calc Af Amer: >90 mL/min (ref >90)
GFR calc non Af Amer: >90 mL/min (ref >90)
Glucose, Bld: 147 mg/dL — ABNORMAL HIGH (ref 70–99)
Potassium: 3.8 mEq/L (ref 3.5–5.1)
Sodium: 138 mEq/L (ref 135–145)
Total Bilirubin: 1.8 mg/dL — ABNORMAL HIGH (ref 0.3–1.2)
Total Protein: 6.9 g/dL (ref 6.0–8.3)

## 2013-01-31 LAB — AMMONIA: Ammonia: 114 umol/L — ABNORMAL HIGH (ref 11–60)

## 2013-01-31 LAB — PROTIME-INR
INR: 1.48 (ref 0.00–1.49)
Prothrombin Time: 17.5 seconds — ABNORMAL HIGH (ref 11.6–15.2)

## 2013-01-31 LAB — LACTIC ACID, PLASMA: Lactic Acid, Venous: 2.2 mmol/L (ref 0.5–2.2)

## 2013-01-31 MED ORDER — PANTOPRAZOLE SODIUM 40 MG PO TBEC
40.0000 mg | DELAYED_RELEASE_TABLET | Freq: Every day | ORAL | Status: DC
  Administered 2013-02-01 – 2013-02-02 (×2): 40 mg via ORAL
  Filled 2013-01-31 (×2): qty 1

## 2013-01-31 MED ORDER — RIFAXIMIN 550 MG PO TABS
550.0000 mg | ORAL_TABLET | Freq: Two times a day (BID) | ORAL | Status: DC
  Administered 2013-01-31 – 2013-02-02 (×4): 550 mg via ORAL
  Filled 2013-01-31 (×10): qty 1

## 2013-01-31 MED ORDER — HYDROCODONE-ACETAMINOPHEN 5-325 MG PO TABS
1.0000 | ORAL_TABLET | Freq: Three times a day (TID) | ORAL | Status: DC | PRN
  Administered 2013-02-01 (×2): 1 via ORAL
  Filled 2013-01-31 (×2): qty 1

## 2013-01-31 MED ORDER — SODIUM CHLORIDE 0.9 % IJ SOLN
3.0000 mL | Freq: Two times a day (BID) | INTRAMUSCULAR | Status: DC
  Administered 2013-01-31 – 2013-02-01 (×3): 3 mL via INTRAVENOUS
  Filled 2013-01-31: qty 3

## 2013-01-31 MED ORDER — POTASSIUM CHLORIDE CRYS ER 20 MEQ PO TBCR
20.0000 meq | EXTENDED_RELEASE_TABLET | Freq: Every day | ORAL | Status: DC
Start: 2013-02-01 — End: 2013-02-01
  Administered 2013-02-01: 20 meq via ORAL
  Filled 2013-01-31: qty 1

## 2013-01-31 MED ORDER — LACTULOSE 10 GM/15ML PO SOLN
20.0000 g | Freq: Once | ORAL | Status: AC
  Administered 2013-01-31: 20 g via ORAL
  Filled 2013-01-31: qty 30

## 2013-01-31 MED ORDER — ONDANSETRON HCL 4 MG PO TABS: 4.0000 mg | ORAL_TABLET | Freq: Four times a day (QID) | ORAL | Status: DC | PRN

## 2013-01-31 MED ORDER — LACTULOSE 10 GM/15ML PO SOLN
40.0000 g | Freq: Three times a day (TID) | ORAL | Status: DC
  Administered 2013-01-31 – 2013-02-02 (×5): 40 g via ORAL
  Filled 2013-01-31 (×5): qty 60

## 2013-01-31 MED ORDER — LEVETIRACETAM 500 MG PO TABS
500.0000 mg | ORAL_TABLET | Freq: Two times a day (BID) | ORAL | Status: DC
  Administered 2013-01-31 – 2013-02-02 (×4): 500 mg via ORAL
  Filled 2013-01-31 (×4): qty 1

## 2013-01-31 MED ORDER — ONDANSETRON HCL 4 MG/2ML IJ SOLN: 4.0000 mg | Freq: Four times a day (QID) | INTRAMUSCULAR | Status: DC | PRN

## 2013-01-31 MED ORDER — ALPRAZOLAM 0.5 MG PO TABS
0.5000 mg | ORAL_TABLET | Freq: Two times a day (BID) | ORAL | Status: DC | PRN
  Administered 2013-02-01: 0.5 mg via ORAL
  Filled 2013-01-31: qty 1

## 2013-01-31 MED ORDER — FLUOXETINE HCL 20 MG PO CAPS
20.0000 mg | ORAL_CAPSULE | Freq: Every day | ORAL | Status: DC
  Administered 2013-02-01 – 2013-02-02 (×2): 20 mg via ORAL
  Filled 2013-01-31 (×2): qty 1

## 2013-01-31 MED ORDER — RIFAXIMIN 550 MG PO TABS
ORAL_TABLET | ORAL | Status: AC
  Filled 2013-01-31: qty 1

## 2013-01-31 MED ORDER — FUROSEMIDE 20 MG PO TABS
20.0000 mg | ORAL_TABLET | Freq: Two times a day (BID) | ORAL | Status: DC
  Administered 2013-02-01 – 2013-02-02 (×3): 20 mg via ORAL
  Filled 2013-01-31 (×3): qty 1

## 2013-01-31 MED ORDER — EZETIMIBE 10 MG PO TABS
10.0000 mg | ORAL_TABLET | Freq: Every day | ORAL | Status: DC
  Administered 2013-01-31 – 2013-02-02 (×3): 10 mg via ORAL
  Filled 2013-01-31 (×3): qty 1

## 2013-01-31 NOTE — ED Notes (Signed)
Wife states pt has not been taking his lactulose for a while. States she made him take a dose this morning. Pt states he does not take the lactulose because he has to go to the bathroom a lot and it gives him gas. 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 States he wants to die at home with his wife and dog.

## 2013-01-31 NOTE — ED Notes (Signed)
Pt assisted to the bathroom.

## 2013-01-31 NOTE — H&P (Signed)
[facility] History and Physical  [name] [name]  ZOX:[id number]  DOB: Jul 23, 1961   DOA: 01/31/2013   PCP:   Mitzi Hansen, NP   Patient is moderately confused and the history is from medical records  Chief Complaint:  Progressive confusion  HPI: [name] [name] is a 52 y.o. male.    From his wife:  [name] [name] is a 52 y.o. Male with a h/o Bipolar 1 disorder, Cirrhosis, HTN and DM brought by wife to the Emergency Department complaining of altered mental status. Pt is disoriented to time and is pleasantly confused. Wife is giving the majority of pt's history. Pt's wife reports pt has been acting strange for 1 month. Specifically, pt has been peeing in every corner of the house, falling down and walking around naked. Wife states that pt has had similar, constant, gradually worsening symptoms onset 3 years ago about the time that the pt was diagnosed with elevated ammonia levels. Wife states that pt has been off his lactulose meds for a while, and that he hasn't been taking them regularly. Wife states that they haven't had money to pay for the meds and pt states that it tastes terrible. Wife states that they owe the drug store 400 dollars. Pt also states that he doesn't sleep well and has been having intermittent, moderate headaches recently. Pt knows where he is and knows his birthday, but is disoriented to time and thinks it is November 2004. Pt denies vomiting, fever, diarrhea or any other symptoms. Pt denies alcohol or illicit drug use. Pt has been smoking 1 pack/day for 30 years  Patient himself is oriented to having mild cirrhosis, and reports is his only problem is that he i has been wetting his bed and no one seems to know why.  Rewiew of Systems:  Patient is not sufficient and oriented for reliable l review of systems    Past Medical History  Diagnosis Date  . Depression   . 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 Bipolar 1 disorder   . Hypertension   . Diabetes mellitus   .  Seizures   . COPD (chronic obstructive pulmonary disease)   . Mental retardation   . Blind right eye   . GERD (gastroesophageal reflux disease)   . Chronic back pain   . Hepatic encephalopathy   . Anemia   . Thrombocytopenia   . Cardiomegaly   . Anxiety   . Arthritis   . Cholelithiasis     Asymptomatic  . Cerebrovascular disease     Right vertebral artery  . Hard of hearing   . Hyperlipidemia   . Cataract     Right eye  . Blood clots in brain     per patient  . Cirrhosis     diagnosed 07/2010 when presented with first episode of hepatic encephalopathy, afp on 416/13=4.8,  U/S on 12/14/11  liver stable, pt has received 2 hep A/B vaccines  . Diverticulosis     Past Surgical History  Procedure Laterality Date  . Tonsillectomy    . Ear operations    . Cast application  08/30/2011    Procedure: MINOR CAST APPLICATION;  Surgeon: Fuller Canada, MD;  Location: [facility];  Service: Orthopedics;  Laterality: Right;  no anesthesia , procedure room do not need or room !  . Orif ankle fracture  08/27/2011    Procedure: OPEN REDUCTION INTERNAL FIXATION (ORIF) ANKLE FRACTURE;  Surgeon: Fuller Canada, MD;  Location: [facility];  Service: Orthopedics;  Laterality: Right;  . Esophagogastroduodenoscopy  02/02/2011    Rourk-Normal esophagus.  No varices/Question mild portal gastropathy, extrinsic compression on the antrum, lesser curvature of uncertain significance, some minimally  nodular mucosa status post biopsy, patent pylorus, normal duodenum 1 and duodenum 2.  . Colonoscopy  12/15/11    colonic divericulosis;poor prep, ACBE  recommended but has not been done  . Esophagogastroduodenoscopy  12/15/11    Rourk-->mild changes of portal gastropathy, gastric/duodenal erosions s/p bx  (reactive changes with mild chronic inflammation. No H.pylori  . Foot surgery      Medications:  HOME MEDS: Prior to Admission medications   Medication Sig Start Date End Date Taking? Authorizing Provider  ALPRAZolam  Prudy Feeler) 0.5 MG tablet Take 0.5 mg by mouth 2 (two) times daily.   Yes Historical Provider, MD  Cholecalciferol (VITAMIN D) 2000 UNITS tablet Take 1 tablet (2,000 Units total) by mouth daily. 09/20/12  Yes Nimish Normajean Glasgow, MD  ezetimibe (ZETIA) 10 MG tablet Take 1 tablet (10 mg total) by mouth daily. 09/20/12  Yes Nimish Normajean Glasgow, MD  FLUoxetine (PROZAC) 20 MG capsule Take 1 capsule (20 mg total) by mouth daily. 09/20/12  Yes Nimish Normajean Glasgow, MD  furosemide (LASIX) 20 MG tablet Take 1 tablet (20 mg total) by mouth 2 (two) times daily. 09/20/12  Yes Nimish Normajean Glasgow, MD  HYDROcodone-acetaminophen (NORCO/VICODIN) 5-325 MG per tablet Take 1 tablet by mouth 3 (three) times daily.   Yes Historical Provider, MD  ibuprofen (ADVIL,MOTRIN) 200 MG tablet Take 400 mg by mouth every 6 (six) hours as needed for pain.   Yes Historical Provider, MD  lactulose (CHRONULAC) 10 GM/15ML solution Take 60 mLs (40 g total) by mouth 3 (three) times daily. Titrate for 3-4 bowel motions daily. 09/20/12  Yes Nimish Normajean Glasgow, MD  levETIRAcetam (KEPPRA) 500 MG tablet Take 1 tablet (500 mg total) by mouth 2 (two) times daily. 09/20/12  Yes Nimish Normajean Glasgow, MD  Multiple Vitamin (MULTIVITAMIN WITH MINERALS) TABS Take 1 tablet by mouth daily.   Yes Historical Provider, MD  omega-3 acid ethyl esters (LOVAZA) 1 G capsule Take 1 capsule (1 g total) by mouth 2 (two) times daily. 09/20/12  Yes Nimish Normajean Glasgow, MD  omeprazole (PRILOSEC) 20 MG capsule Take 1 capsule (20 mg total) by mouth daily. 09/20/12  Yes Nimish Normajean Glasgow, MD  potassium chloride SA (K-DUR,KLOR-CON) 20 MEQ tablet Take 1 tablet (20 mEq total) by mouth daily. 09/20/12  Yes Nimish Normajean Glasgow, MD  rifaximin (XIFAXAN) 550 MG TABS Take 1 tablet (550 mg total) by mouth 2 (two) times daily. 09/20/12  Yes Wilson Singer, MD     Allergies:  Allergies  Allergen Reactions  . Penicillins Rash    Social History:   reports that he has been smoking Cigarettes.  He has a 30  pack-year smoking history. He does not have any smokeless tobacco history on file. He reports that he does not drink alcohol or use illicit drugs.  Family History: Family History  Problem Relation Age of Onset  . Heart failure Mother   . Thyroid disease Mother   . Cancer Father   . Asthma Brother   . Heart attack Brother   . Cirrhosis Brother     EtOH  . Colon cancer Maternal Aunt      Physical Exam: Filed Vitals:   01/31/13 1751 01/31/13 1800 01/31/13 1952 01/31/13 2008  BP: 134/80 132/77 134/80 147/80  Pulse: 66 65 61 67  Temp:  97.6 F (36.4 C)  TempSrc:      Resp: 20 14 20 20   Height:    5\' 7"  (1.702 m)  Weight:    90.8 kg (200 lb 2.8 oz)  SpO2: 100%  98% 95%   Blood pressure 147/80, pulse 67, temperature 97.6 F (36.4 C), temperature source Oral, resp. rate 20, height 5\' 7"  (1.702 m), weight 90.8 kg (200 lb 2.8 oz), SpO2 95.00%.  GEN:  Pleasant middle-aged Caucasian man lying bed in no acute distress; cooperative with exam PSYCH:  alert and oriented x2;  neither anxious nor depressed; affect is appropriate. HEENT: Mucous membranes pink and anicteric; PERRLA; EOM intact; no cervical lymphadenopathy nor thyromegaly or carotid bruit; no JVD; Breasts:: Not examined CHEST WALL: No tenderness CHEST: Normal respiration, clear to auscultation bilaterally HEART: Regular rate and rhythm; no murmurs rubs or gallops ABDOMEN: Obese, soft non-tender; no masses, no organomegaly, normal abdominal bowel sounds; Rectal Exam: Not done EXTREMITIES:  age-appropriate arthropathy of the hands and knees; 1+ edema; no ulcerations. Genitalia: not examined PULSES: 2+ and symmetric SKIN: Normal hydration no rash or ulceration CNS: Cranial nerves 2-12 grossly intact no focal lateralizing neurologic deficit; no asterixis; no other tremor.   Labs on Admission:  Basic Metabolic Panel:  Recent Labs Lab 01/31/13 1646  NA 138  K 3.8  CL 102  CO2 24  GLUCOSE 147*  BUN 11  CREATININE  0.64  CALCIUM 9.1   Liver Function Tests:  Recent Labs Lab 01/31/13 1646  AST 36  ALT 14  ALKPHOS 140*  BILITOT 1.8*  PROT 6.9  ALBUMIN 3.1*   No results found for this basename: LIPASE, AMYLASE,  in the last 168 hours  Recent Labs Lab 01/31/13 1647  AMMONIA 114*   CBC:  Recent Labs Lab 01/31/13 1646  WBC 2.9*  NEUTROABS 1.4*  HGB 10.3*  HCT 32.6*  MCV 85.1  PLT 72*   Cardiac Enzymes: No results found for this basename: CKTOTAL, CKMB, CKMBINDEX, TROPONINI,  in the last 168 hours BNP: No components found with this basename: POCBNP,  D-dimer: No components found with this basename: D-DIMER,  CBG: No results found for this basename: GLUCAP,  in the last 168 hours  Radiological Exams on Admission: Ct Head Wo Contrast  01/31/2013   *RADIOLOGY REPORT*  Clinical Data: Altered mental status.  CT HEAD WITHOUT CONTRAST  Technique:  Contiguous axial images were obtained from the base of the skull through the vertex without contrast.  Comparison: CT head without contrast 09/17/2012.  Findings: No acute cortical infarct, hemorrhage, or mass lesion is present.  The ventricles are normal size.  No significant extra- axial fluid collection is present.  The paranasal sinuses are clear.  A calcified right globe is again noted.  The patient is status post bilateral mastoidectomies.  IMPRESSION:  1.  Normal CT appearance of the brain. 2.  Right-sided physis bulbi. 3.  Bilateral mastoidectomies.   Original Report Authenticated By: Marin Roberts, M.D.       Assessment/Plan   Active Problems:   COPD (chronic obstructive pulmonary disease)   DM (diabetes mellitus)   Coagulopathy   Cirrhosis   Seizure disorder   Tobacco abuse   Hepatic encephalopathy   Pancytopenia, secondary due to liver cirrhosis   Hyperammonemia   Urinary incontinence    PLAN: Will manage this gentleman for mild hepatic encephalopathy; he has already had a loose bowel movement and appears to be  improving;  It's unclear if he is having inappropriate behavior  or if he is having simple incontinence, possible urge incontinence. Of note she is taking Lasix and does not appear to be taking Aldactone; his medications may need to be adjusted with the GI service.  It is not clear that his abnormal behavior of peeing around the house, if that is in fact correct, is a symptom of her hepatic encephalopathy. He may have another behavioral health problem. Will need to be clarified when he is more stable  Other plans as per orders.  Code Status: Full code Family Communication: Plans discuss with patient and family at bedside Disposition Plan:  Depending on response to therapy   Winslow Verrill Nocturnist [facility] Pager [phone]   01/31/2013, 9:24 PM

## 2013-01-31 NOTE — ED Notes (Signed)
Pt wife reports pt has been acting strange x 1 month (falling, walking around naked, peeing all over house). Pt states it has gotten worse over the past few days. Pt is on lactulose for elevated ammonia. Pt told to come to ED by PCP office.

## 2013-01-31 NOTE — ED Provider Notes (Signed)
History  This chart was scribed for [name] Manifold, MD by Elby Beck, ED Scribe. This patient was seen in room APA18/APA18 and the patient's care was started at 4:57 PM.   CSN: [id number]  Arrival date & time 01/31/13  1556     Chief Complaint  Patient presents with  . Altered Mental Status     The history is provided by the patient and the spouse. No language interpreter was used.   HPI Comments: [name] [name] is a 52 y.o. Male with a h/o Bipolar 1 disorder, Cirrhosis, HTN and DM brought by wife to the Emergency Department complaining of altered mental status. Pt is disoriented to time and is pleasantly confused. Wife is giving the majority of pt's history. Pt's wife reports pt has been acting strange for 1 month. Specifically, pt has been peeing in every corner of the house, falling down and walking around naked. Wife states that pt has had similar, constant, gradually worsening symptoms onset 3 years ago about the time that the pt was diagnosed with elevated ammonia levels. Wife states that pt has been off his lactulose meds for a while, and that he hasn't been taking them regularly. Wife states that they haven't had money to pay for the meds and pt states that it tastes terrible. Wife states that they owe the drug store 400 dollars. Pt also states that he doesn't sleep well and has been having intermittent, moderate headaches recently. Pt knows where he is and knows his birthday, but is disoriented to time and thinks it is November 2004. Pt denies vomiting, fever, diarrhea or any other symptoms. Pt denies alcohol or illicit drug use. Pt has been smoking 1 pack/day for 30 years.    Past Medical History  Diagnosis Date  . Depression   . 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 Bipolar 1 disorder   . Hypertension   . Diabetes mellitus   . Seizures   . COPD (chronic obstructive pulmonary disease)   . Mental retardation   . Blind right eye   . GERD (gastroesophageal reflux disease)   .  Chronic back pain   . Hepatic encephalopathy   . Anemia   . Thrombocytopenia   . Cardiomegaly   . Anxiety   . Arthritis   . Cholelithiasis     Asymptomatic  . Cerebrovascular disease     Right vertebral artery  . Hard of hearing   . Hyperlipidemia   . Cataract     Right eye  . Blood clots in brain     per patient  . Cirrhosis     diagnosed 07/2010 when presented with first episode of hepatic encephalopathy, afp on 416/13=4.8,  U/S on 12/14/11  liver stable, pt has received 2 hep A/B vaccines  . Diverticulosis     Past Surgical History  Procedure Laterality Date  . Tonsillectomy    . Ear operations    . Cast application  10/23/2023    Procedure: MINOR CAST APPLICATION;  Surgeon: [name], MD;  Location: [facility];  Service: Orthopedics;  Laterality: Right;  no anesthesia , procedure room do not need or room !  . Orif ankle fracture  08/27/2011    Procedure: OPEN REDUCTION INTERNAL FIXATION (ORIF) ANKLE FRACTURE;  Surgeon: [name], MD;  Location: [facility];  Service: Orthopedics;  Laterality: Right;  . Esophagogastroduodenoscopy  02/02/2011    Rourk-Normal esophagus.  No varices/Question mild portal gastropathy, extrinsic compression on the antrum, lesser curvature of uncertain  significance, some minimally  nodular mucosa status post biopsy, patent pylorus, normal duodenum 1 and duodenum 2.  . Colonoscopy  12/15/11    colonic divericulosis;poor prep, ACBE  recommended but has not been done  . Esophagogastroduodenoscopy  12/15/11    Rourk-->mild changes of portal gastropathy, gastric/duodenal erosions s/p bx  (reactive changes with mild chronic inflammation. No H.pylori  . Foot surgery      Family History  Problem Relation Age of Onset  . Heart failure Mother   . Thyroid disease Mother   . Cancer Father   . Asthma Brother   . Heart attack Brother   . Cirrhosis Brother     EtOH  . Colon cancer Maternal Aunt     History  Substance Use Topics  . Smoking status:  Current Every Day Smoker -- 1.00 packs/day for 30 years    Types: Cigarettes  . Smokeless tobacco: Not on file     Comment: only smokes about 1-2 a day  . Alcohol Use: No      Review of Systems  Constitutional: Negative for fever and chills.  Gastrointestinal: Negative for nausea, vomiting and diarrhea.  Neurological: Positive for headaches.  Psychiatric/Behavioral: Positive for confusion.   A complete 10 system review of systems was obtained and all systems are negative except as noted in the HPI and PMH.   Allergies  Penicillins  Home Medications   Current Outpatient Rx  Name  Route  Sig  Dispense  Refill  . Cholecalciferol (VITAMIN D) 2000 UNITS tablet   Oral   Take 1 tablet (2,000 Units total) by mouth daily.   30 tablet   0   . ezetimibe (ZETIA) 10 MG tablet   Oral   Take 1 tablet (10 mg total) by mouth daily.   30 tablet   0   . FLUoxetine (PROZAC) 20 MG capsule   Oral   Take 1 capsule (20 mg total) by mouth daily.   30 capsule   0   . furosemide (LASIX) 20 MG tablet   Oral   Take 1 tablet (20 mg total) by mouth 2 (two) times daily.   30 tablet   0   . lactulose (CHRONULAC) 10 GM/15ML solution   Oral   Take 60 mLs (40 g total) by mouth 3 (three) times daily. Titrate for 3-4 bowel motions daily.   240 mL   0   . levETIRAcetam (KEPPRA) 500 MG tablet   Oral   Take 1 tablet (500 mg total) by mouth 2 (two) times daily.   60 tablet   0   . metoprolol (LOPRESSOR) 50 MG tablet   Oral   Take 1 tablet (50 mg total) by mouth 3 (three) times daily.   90 tablet   0   . omega-3 acid ethyl esters (LOVAZA) 1 G capsule   Oral   Take 1 capsule (1 g total) by mouth 2 (two) times daily.   60 capsule   0   . omeprazole (PRILOSEC) 20 MG capsule   Oral   Take 1 capsule (20 mg total) by mouth daily.   30 capsule   0   . potassium chloride SA (K-DUR,KLOR-CON) 20 MEQ tablet   Oral   Take 1 tablet (20 mEq total) by mouth daily.   30 tablet   0   .  rifaximin (XIFAXAN) 550 MG TABS   Oral   Take 1 tablet (550 mg total) by mouth 2 (two) times daily.   60 tablet  0     Triage Vitals: BP 159/69  Pulse 64  Temp(Src) 96.7 F (35.9 C) (Oral)  Resp 16  Ht 5' 7"  (1.702 m)  Wt 190 lb (86.183 kg)  BMI 29.75 kg/m2  SpO2 100%  Physical Exam  Nursing note and vitals reviewed. Constitutional: He is oriented to person, place, and time. He appears well-developed and well-nourished.  Pleasantly confused. Disoriented to time.  HENT:  Head: Normocephalic and atraumatic.  Right cornea is opaque.  Eyes: EOM are normal.  Neck: Normal range of motion.  Cardiovascular: Normal rate, regular rhythm, normal heart sounds and intact distal pulses.   Pulmonary/Chest: Effort normal and breath sounds normal. No respiratory distress.  Abdominal: Soft. He exhibits no distension. There is no tenderness.  Genitourinary: Rectum normal.  Musculoskeletal: Normal range of motion.  Neurological: He is alert and oriented to person, place, and time.  Skin: Skin is warm and dry.  Mild pitting symmetrically. Edema.  Psychiatric: He has a normal mood and affect. Judgment normal.    ED Course  Procedures (including critical care time)  DIAGNOSTIC STUDIES: Oxygen Saturation is 100% on RA, normal by my interpretation.    COORDINATION OF CARE: 5:09 PM- Pt advised of plan for treatment and pt agrees.  Medications  lactulose (CHRONULAC) 10 GM/15ML solution 20 g (20 g Oral Given 01/31/13 1845)     Labs Reviewed  COMPREHENSIVE METABOLIC PANEL - Abnormal; Notable for the following:    Glucose, Bld 147 (*)    Albumin 3.1 (*)    Alkaline Phosphatase 140 (*)    Total Bilirubin 1.8 (*)    All other components within normal limits  CBC WITH DIFFERENTIAL - Abnormal; Notable for the following:    WBC 2.9 (*)    RBC 3.83 (*)    Hemoglobin 10.3 (*)    HCT 32.6 (*)    RDW 18.1 (*)    Platelets 72 (*)    Monocytes Relative 14 (*)    Neutro Abs 1.4 (*)    All  other components within normal limits  AMMONIA - Abnormal; Notable for the following:    Ammonia 114 (*)    All other components within normal limits  PROTIME-INR - Abnormal; Notable for the following:    Prothrombin Time 17.5 (*)    All other components within normal limits  URINALYSIS, ROUTINE W REFLEX MICROSCOPIC - Abnormal; Notable for the following:    Urobilinogen, UA 4.0 (*)    All other components within normal limits  LACTIC ACID, PLASMA   Ct Head Wo Contrast  01/31/2013   *RADIOLOGY REPORT*  Clinical Data: Altered mental status.  CT HEAD WITHOUT CONTRAST  Technique:  Contiguous axial images were obtained from the base of the skull through the vertex without contrast.  Comparison: CT head without contrast 09/17/2012.  Findings: No acute cortical infarct, hemorrhage, or mass lesion is present.  The ventricles are normal size.  No significant extra- axial fluid collection is present.  The paranasal sinuses are clear.  A calcified right globe is again noted.  The patient is status post bilateral mastoidectomies.  IMPRESSION:  1.  Normal CT appearance of the brain. 2.  Right-sided physis bulbi. 3.  Bilateral mastoidectomies.   Original Report Authenticated By: San Morelle, M.D.     1. Hepatic encephalopathy   2. Hyperammonemia       MDM  51yM with increased confusion. Likely hepatic encephalopathy. Questionable compliance with home meds.  I personally preformed the services scribed in my presence. The recorded information has been reviewed is accurate. [name] Manifold, MD.    [name] Manifold, MD 02/06/13 [phone]

## 2013-02-01 ENCOUNTER — Inpatient Hospital Stay (HOSPITAL_COMMUNITY): Payer: Medicare Other

## 2013-02-01 DIAGNOSIS — E119 Type 2 diabetes mellitus without complications: Secondary | ICD-10-CM

## 2013-02-01 DIAGNOSIS — I10 Essential (primary) hypertension: Secondary | ICD-10-CM

## 2013-02-01 DIAGNOSIS — E876 Hypokalemia: Secondary | ICD-10-CM

## 2013-02-01 LAB — CBC
HCT: 30.6 % — ABNORMAL LOW (ref 39.0–52.0)
Hemoglobin: 9.8 g/dL — ABNORMAL LOW (ref 13.0–17.0)
MCH: 27.4 pg (ref 26.0–34.0)
MCHC: 32 g/dL (ref 30.0–36.0)
RBC: 3.58 MIL/uL — ABNORMAL LOW (ref 4.22–5.81)

## 2013-02-01 LAB — COMPREHENSIVE METABOLIC PANEL
Alkaline Phosphatase: 125 U/L — ABNORMAL HIGH (ref 39–117)
BUN: 7 mg/dL (ref 6–23)
CO2: 26 mEq/L (ref 19–32)
Calcium: 8.8 mg/dL (ref 8.4–10.5)
GFR calc Af Amer: >90 mL/min (ref >90)
GFR calc non Af Amer: >90 mL/min (ref >90)
Glucose, Bld: 78 mg/dL (ref 70–99)
Total Protein: 6.3 g/dL (ref 6.0–8.3)

## 2013-02-01 MED ORDER — POTASSIUM CHLORIDE CRYS ER 20 MEQ PO TBCR
20.0000 meq | EXTENDED_RELEASE_TABLET | Freq: Two times a day (BID) | ORAL | Status: DC
  Administered 2013-02-01 – 2013-02-02 (×3): 20 meq via ORAL
  Filled 2013-02-01 (×3): qty 1

## 2013-02-01 MED ORDER — NICOTINE 14 MG/24HR TD PT24
14.0000 mg | MEDICATED_PATCH | Freq: Every day | TRANSDERMAL | Status: DC
  Administered 2013-02-01 – 2013-02-02 (×2): 14 mg via TRANSDERMAL
  Filled 2013-02-01 (×2): qty 1

## 2013-02-01 NOTE — Clinical Social Work Psychosocial (Signed)
Clinical Social Work Department BRIEF PSYCHOSOCIAL ASSESSMENT 02/01/2013  Patient:  [name] [name], [name] [name]     Account Number:  [id number]     Admit date:  01/31/2013  Clinical Social Worker:  Nancie Neas  Date/Time:  02/01/2013 02:20 PM  Referred by:  Physician  Date Referred:  02/01/2013 Referred for  Psychosocial assessment   Other Referral:   Interview type:  Family Other interview type:   wife- [name] [name]    PSYCHOSOCIAL DATA Living Status:  FAMILY Admitted from facility:   Level of care:   Primary support name:  [name] [name] Primary support relationship to patient:  SPOUSE Degree of support available:   supportive    CURRENT CONCERNS Current Concerns  Other - See comment   Other Concerns:   psychosocial    SOCIAL WORK ASSESSMENT / PLAN CSW spoke with pt's wife, [name] [name] on phone following referral to discuss home situation. Pt well known to CSW from previous admissions. He has been worked up for ALF in the past and then on day of d/c refused. Pt asleep at time of assessment. Pt's wife reports things have been "up and down" at home since last admission 5 months ago. Recently he has been more difficult at home. She states she realizes now that his ammonia levels were elevated. She can usually tell this when he begins to act differently and is urinating all over the house. Before bring him to ED, this was occurring as well as he was having difficulty ambulating and feeding himself. She states when this is corrected, pt is fine at home. Pt ambulates with a cane at baseline. She plans for pt to return home at d/c. Since last admission, [name] [name]'s brother and sister-in-law have moved in with them and are also available to help provide care to pt.   Assessment/plan status:  No Further Intervention Required Other assessment/ plan:   Information/referral to community resources:   n/a    PATIENT'S/FAMILY'S RESPONSE TO PLAN OF CARE: Pt unable to discuss plan of care at this time. [name] [name]  reports they plan to take pt home from hospital. She is not sure if pt had home health after d/c last time. CSW signing off as no SW needs reported.       Derenda Fennel, [place] [phone]

## 2013-02-01 NOTE — Progress Notes (Signed)
[facility] PROGRESS NOTE  [name] [name] IHK:[id number] DOB: 19-Sep-1960 DOA: 01/31/2013 PCP: Carolee Rota, NP  Assessment/Plan:  Hepatic encephalopathy: likely related to elevated ammonia level secondary to non-compliance with meds. Improved this am. Continue lactulose TID. Will recheck ammonia level in am.   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 Continue Xifaxin and lactulose.   Hyperammonemia: clinically improved. Will continue lactulose and recheck ammonia level in am.    Urinary incontinence: no further episodes. Likely related to #1.   Hypokalemia: mild likely related to frequent stools due to lactulose. monitor    Seizure disorder: continue home meds. Stable    Tobacco abuse:counseled regarding cessation    Pancytopenia:Related to #2. Chart review indicates levels close to baseling    COPD (chronic obstructive pulmonary disease): stable at baseline.     DM (diabetes mellitus):not on home med. CBG 78. Will monitor daily. Appetite improving    Coagulopathy: chart review indicates levels at baseline     Code Status: full Family Communication: wife at bedside Disposition Plan: home when able. Of note, pt hospitalized 6 months ago with similar circumstances and assisted living discussed as pt not eligible for placement. Caregivers verbalize difficulty getting pt to comply with meds/care.   Consultants:  none  Procedures:  none  Antibiotics:  none  HPI/Subjective: Sitting in chair oriented. Reports feeling better. Complains lactulose causes too many bathroom visits  Objective: Filed Vitals:   01/31/13 1952 01/31/13 2008 02/01/13 0500 02/01/13 0551  BP: 134/80 147/80  160/71  Pulse: 61 67  53  Temp:  97.6 F (36.4 C)  98.1 F (36.7 C)  TempSrc:      Resp: 20 20  20   Height:  5' 7"  (1.702 m)    Weight:  90.8 kg (200 lb 2.8 oz) 92.216 kg (203 lb 4.8 oz)   SpO2: 98% 95%  99%    Intake/Output Summary (Last 24 hours) at 02/01/13 1119 Last  data filed at 02/01/13 1011  Gross per 24 hour  Intake      3 ml  Output    925 ml  Net   -922 ml   Filed Weights   01/31/13 1603 01/31/13 2008 02/01/13 0500  Weight: 86.183 kg (190 lb) 90.8 kg (200 lb 2.8 oz) 92.216 kg (203 lb 4.8 oz)    Exam:   General:  Well nourished looking older than stated age NAD  Cardiovascular: RRR No MGR trace LE edema  Respiratory: normal effort BS slightly distant, no wheeze no rhonchi  Abdomen: obese soft +BS no mass, non-tender to palpation  Musculoskeletal: no clubbing no cyanosis   Data Reviewed: Basic Metabolic Panel:  Recent Labs Lab 01/31/13 1646 02/01/13 0540  NA 138 140  K 3.8 3.4*  CL 102 107  CO2 24 26  GLUCOSE 147* 78  BUN 11 7  CREATININE 0.64 0.57  CALCIUM 9.1 8.8  MG  --  1.6   Liver Function Tests:  Recent Labs Lab 01/31/13 1646 02/01/13 0540  AST 36 32  ALT 14 14  ALKPHOS 140* 125*  BILITOT 1.8* 1.7*  PROT 6.9 6.3  ALBUMIN 3.1* 2.9*   No results found for this basename: LIPASE, AMYLASE,  in the last 168 hours  Recent Labs Lab 01/31/13 1647  AMMONIA 114*   CBC:  Recent Labs Lab 01/31/13 1646 02/01/13 0540  WBC 2.9* 3.2*  NEUTROABS 1.4*  --   HGB 10.3* 9.8*  HCT 32.6* 30.6*  MCV 85.1 85.5  PLT 72* 58*   Cardiac Enzymes:  No results found for this basename: CKTOTAL, CKMB, CKMBINDEX, TROPONINI,  in the last 168 hours BNP (last 3 results) No results found for this basename: PROBNP,  in the last 8760 hours CBG: No results found for this basename: GLUCAP,  in the last 168 hours  No results found for this or any previous visit (from the past 240 hour(s)).   Studies: Ct Head Wo Contrast  01/31/2013   *RADIOLOGY REPORT*  Clinical Data: Altered mental status.  CT HEAD WITHOUT CONTRAST  Technique:  Contiguous axial images were obtained from the base of the skull through the vertex without contrast.  Comparison: CT head without contrast 09/17/2012.  Findings: No acute cortical infarct, hemorrhage, or  mass lesion is present.  The ventricles are normal size.  No significant extra- axial fluid collection is present.  The paranasal sinuses are clear.  A calcified right globe is again noted.  The patient is status post bilateral mastoidectomies.  IMPRESSION:  1.  Normal CT appearance of the brain. 2.  Right-sided physis bulbi. 3.  Bilateral mastoidectomies.   Original Report Authenticated By: San Morelle, M.D.    Scheduled Meds: . ezetimibe  10 mg Oral Daily  . FLUoxetine  20 mg Oral Daily  . furosemide  20 mg Oral BID  . lactulose  40 g Oral TID  . levETIRAcetam  500 mg Oral BID  . nicotine  14 mg Transdermal Daily  . pantoprazole  40 mg Oral Daily  . potassium chloride SA  20 mEq Oral Daily  . rifaximin  550 mg Oral BID  . sodium chloride  3 mL Intravenous Q12H   Continuous Infusions:   Principal Problem:    Time spent: 35 minutes    [facility] Pager [phone]. If 7PM-7AM, please contact night-coverage at [email or web address], password [facility] 02/01/2013, 11:19 AM  LOS: 1 day   Attending: Patient seen and examined. Above note  reviewed. The patient appears to have improved from his hepatic encephalopathy. He is now complaining of right shoulder and right ankle pain. We will x-ray these areas to make sure there is no fracture. Otherwise, if he remains stable and continued to improve, he should be able to be discharged home tomorrow.

## 2013-02-01 NOTE — Progress Notes (Signed)
Pt c/o worsening right ankle and right shoulder/arm pain.  Pt states he thinks it may be from fall that took place at home prior to admission.  Dr. Karilyn Cota verbally made aware and new orders placed for X-rays of both ankle and shoulder. Will continue to monitor.

## 2013-02-01 NOTE — Progress Notes (Signed)
UR chart review completed.  

## 2013-02-02 DIAGNOSIS — D649 Anemia, unspecified: Secondary | ICD-10-CM

## 2013-02-02 LAB — CBC
Hemoglobin: 10.9 g/dL — ABNORMAL LOW (ref 13.0–17.0)
MCH: 27.4 pg (ref 26.0–34.0)
MCHC: 31.7 g/dL (ref 30.0–36.0)
Platelets: 69 10*3/uL — ABNORMAL LOW (ref 150–400)
RBC: 3.98 MIL/uL — ABNORMAL LOW (ref 4.22–5.81)

## 2013-02-02 LAB — COMPREHENSIVE METABOLIC PANEL
ALT: 14 U/L (ref 0–53)
AST: 32 U/L (ref 0–37)
Albumin: 2.9 g/dL — ABNORMAL LOW (ref 3.5–5.2)
Alkaline Phosphatase: 137 U/L — ABNORMAL HIGH (ref 39–117)
Calcium: 8.9 mg/dL (ref 8.4–10.5)
GFR calc Af Amer: >90 mL/min (ref >90)
Glucose, Bld: 99 mg/dL (ref 70–99)
Potassium: 3.8 mEq/L (ref 3.5–5.1)
Sodium: 136 mEq/L (ref 135–145)
Total Protein: 6.7 g/dL (ref 6.0–8.3)

## 2013-02-02 LAB — AMMONIA: Ammonia: 50 umol/L (ref 11–60)

## 2013-02-02 MED ORDER — LACTULOSE 10 GM/15ML PO SOLN: 40.0000 g | Freq: Three times a day (TID) | ORAL | Status: DC

## 2013-02-02 NOTE — Progress Notes (Signed)
UR chart review completed.  

## 2013-02-02 NOTE — Discharge Summary (Signed)
Physician Discharge Summary  [name]:[id number] DOB: [birth year] DOA: 01/31/2013  PCP: Mitzi Hansen, NP  Admit date: 01/31/2013 Discharge date: 02/02/2013  Time spent: Greater than 30 minutes  Recommendations for Outpatient Follow-up:  1. Followup with primary care physician in the next couple weeks.   Discharge Diagnoses:  1. Hepatic encephalopathy, resolved. 2. Cirrhosis secondary to NASH. 3. Type 2 diabetes mellitus, stable. 4. Pancytopenia secondary to #2, improving. 5. COPD, stable  Discharge Condition: Stable and improved.  Diet recommendation: Regular.  Filed Weights   01/31/13 2008 02/01/13 0500 02/02/13 0523  Weight: 90.8 kg (200 lb 2.8 oz) 92.216 kg (203 lb 4.8 oz) 92.352 kg (203 lb 9.6 oz)    History of present illness:  This 52 year old man presents to the hospital with symptoms of progressive confusion. Please see initial history as outlined below: From his wife:  [name] [name] is a 52 y.o. Male with a h/o Bipolar 1 disorder, Cirrhosis, HTN and DM brought by wife to the Emergency Department complaining of altered mental status. Pt is disoriented to time and is pleasantly confused. Wife is giving the majority of pt's history. Pt's wife reports pt has been acting strange for 1 month. Specifically, pt has been peeing in every corner of the house, falling down and walking around naked. Wife states that pt has had similar, constant, gradually worsening symptoms onset 3 years ago about the time that the pt was diagnosed with elevated ammonia levels. Wife states that pt has been off his lactulose meds for a while, and that he hasn't been taking them regularly. Wife states that they haven't had money to pay for the meds and pt states that it tastes terrible. Wife states that they owe the drug store 400 dollars. Pt also states that he doesn't sleep well and has been having intermittent, moderate headaches recently. Pt knows where he is and knows his birthday, but is  disoriented to time and thinks it is November 2004. Pt denies vomiting, fever, diarrhea or any other symptoms. Pt denies alcohol or illicit drug use. Pt has been smoking 1 pack/day for 30 years  Hospital Course:  Patient was felt to have hepatic encephalopathy. He was restarted on lactulose. I think the issue had been that he has not been very compliant with taking lactulose, secondary to lack of financial resources. Once lactulose was started his encephalopathy resolved very quickly. Also, placement was discussed. The family wishes to take him home for the time being to see how he will do. He is now stable for discharge.  Procedures:  None.  Consultations:  None.  Discharge Exam: Filed Vitals:   02/01/13 1502 02/01/13 2107 02/02/13 0215 02/02/13 0523  BP: 129/77 126/58 107/43 124/55  Pulse: 65 63 76 67  Temp: 98 F (36.7 C) 98.2 F (36.8 C) 97.6 F (36.4 C) 97.8 F (36.6 C)  TempSrc:  Oral Oral Oral  Resp: 18 20 20 20   Height:      Weight:    92.352 kg (203 lb 9.6 oz)  SpO2: 99% 100% 99% 97%    General: He looks systemically well. He is alert and orientated. He is not encephalopathic whatsoever at this time. Cardiovascular: Heart sounds are present and normal without murmurs or added sounds. Respiratory: Lung fields are clear. Abdomen is soft and nontender.  Discharge Instructions  Discharge Orders   Future Orders Complete By Expires     Diet - low sodium heart healthy  As directed     Increase activity slowly  As directed         Medication List    STOP taking these medications       ibuprofen 200 MG tablet  Commonly known as:  ADVIL,MOTRIN      TAKE these medications       ALPRAZolam 0.5 MG tablet  Commonly known as:  XANAX  Take 0.5 mg by mouth 2 (two) times daily.     ezetimibe 10 MG tablet  Commonly known as:  ZETIA  Take 1 tablet (10 mg total) by mouth daily.     FLUoxetine 20 MG capsule  Commonly known as:  PROZAC  Take 1 capsule (20 mg total)  by mouth daily.     furosemide 20 MG tablet  Commonly known as:  LASIX  Take 1 tablet (20 mg total) by mouth 2 (two) times daily.     HYDROcodone-acetaminophen 5-325 MG per tablet  Commonly known as:  NORCO/VICODIN  Take 1 tablet by mouth 3 (three) times daily.     lactulose 10 GM/15ML solution  Commonly known as:  CHRONULAC  Take 60 mLs (40 g total) by mouth 3 (three) times daily. Titrate for 3-4 bowel motions daily.     levETIRAcetam 500 MG tablet  Commonly known as:  KEPPRA  Take 1 tablet (500 mg total) by mouth 2 (two) times daily.     multivitamin with minerals Tabs  Take 1 tablet by mouth daily.     omega-3 acid ethyl esters 1 G capsule  Commonly known as:  LOVAZA  Take 1 capsule (1 g total) by mouth 2 (two) times daily.     omeprazole 20 MG capsule  Commonly known as:  PRILOSEC  Take 1 capsule (20 mg total) by mouth daily.     potassium chloride SA 20 MEQ tablet  Commonly known as:  K-DUR,KLOR-CON  Take 1 tablet (20 mEq total) by mouth daily.     rifaximin 550 MG Tabs  Commonly known as:  XIFAXAN  Take 1 tablet (550 mg total) by mouth 2 (two) times daily.     Vitamin D 2000 UNITS tablet  Take 1 tablet (2,000 Units total) by mouth daily.       Allergies  Allergen Reactions  . Penicillins Rash      The results of significant diagnostics from this hospitalization (including imaging, microbiology, ancillary and laboratory) are listed below for reference.    Significant Diagnostic Studies: Dg Shoulder Right  02/01/2013   *RADIOLOGY REPORT*  Clinical Data: Right shoulder pain and recent fall.  RIGHT SHOULDER - 2+ VIEW  Comparison: None.  Findings: No acute fracture or dislocation is identified.  There are degenerative changes seen involving the [facility] joint.  Soft tissues are unremarkable.  No bony lesions or destruction identified.  IMPRESSION: No acute findings.  Degenerative changes of the [facility] joint present.   Original Report Authenticated By: Irish Lack, M.D.    Dg Ankle Complete Right  02/01/2013   *RADIOLOGY REPORT*  Clinical Data: Recent fall with right ankle pain.  History of prior right ankle surgery.  RIGHT ANKLE - COMPLETE 3+ VIEW  Comparison: 09/17/2012  Findings: Stable appearance and positioning of a fixation plate and screws along a long segment of the distal fibula that also inserts into the calcaneus.  No abnormal lucency is seen around hardware and there is no evidence of hardware fracture.  The ankle shows stable significant deformity related to trauma and likely neuropathic collapse.  No interval fracture, subluxation or bony destruction is identified.  No focal bony lesions are identified.  Soft tissues are unremarkable.  IMPRESSION: Stable appearance of the right ankle evidence of prior fixation of the fibula and severe neuropathic changes and bony collapse of the ankle.   Original Report Authenticated By: Irish Lack, M.D.   Ct Head Wo Contrast  01/31/2013   *RADIOLOGY REPORT*  Clinical Data: Altered mental status.  CT HEAD WITHOUT CONTRAST  Technique:  Contiguous axial images were obtained from the base of the skull through the vertex without contrast.  Comparison: CT head without contrast 09/17/2012.  Findings: No acute cortical infarct, hemorrhage, or mass lesion is present.  The ventricles are normal size.  No significant extra- axial fluid collection is present.  The paranasal sinuses are clear.  A calcified right globe is again noted.  The patient is status post bilateral mastoidectomies.  IMPRESSION:  1.  Normal CT appearance of the brain. 2.  Right-sided physis bulbi. 3.  Bilateral mastoidectomies.   Original Report Authenticated By: Marin Roberts, M.D.        Labs: Basic Metabolic Panel:  Recent Labs Lab 01/31/13 1646 02/01/13 0540 02/02/13 0532  NA 138 140 136  K 3.8 3.4* 3.8  CL 102 107 101  CO2 24 26 27   GLUCOSE 147* 78 99  BUN 11 7 9   CREATININE 0.64 0.57 0.64  CALCIUM 9.1 8.8 8.9  MG  --  1.6  --     Liver Function Tests:  Recent Labs Lab 01/31/13 1646 02/01/13 0540 02/02/13 0532  AST 36 32 32  ALT 14 14 14   ALKPHOS 140* 125* 137*  BILITOT 1.8* 1.7* 1.3*  PROT 6.9 6.3 6.7  ALBUMIN 3.1* 2.9* 2.9*     Recent Labs Lab 01/31/13 1647 02/02/13 0531  AMMONIA 114* 50   CBC:  Recent Labs Lab 01/31/13 1646 02/01/13 0540 02/02/13 0532  WBC 2.9* 3.2* 3.7*  NEUTROABS 1.4*  --   --   HGB 10.3* 9.8* 10.9*  HCT 32.6* 30.6* 34.4*  MCV 85.1 85.5 86.4  PLT 72* 58* 69*        Signed:  Lam Bjorklund C  [facility] 02/02/2013, 8:42 AM

## 2013-02-02 NOTE — Progress Notes (Signed)
Pt a/o.vss. Up ad lib. Discharge instructions given. Prescriptions given. Pt spouse verbalized understanding of instructions. Pt left floor via wheelchair with nursing staff and family members.

## 2013-02-02 NOTE — Care Management Note (Signed)
    Page 1 of 1   02/02/2013     10:21:09 AM   CARE MANAGEMENT NOTE 02/02/2013  Patient:  [name] [name], [name] [name]   Account Number:  [id number]  Date Initiated:  02/02/2013  Documentation initiated by:  Sharrie Rothman  Subjective/Objective Assessment:   Pt admitted from home with hepatic encephalopathy. Pt lives with his wife and will return home at discharge. Pt is fairly independent with ADL's.     Action/Plan:   Pt requested help with medications and CM explained to pt and wife that we would not be able to help. Did encourage them to go to [facility] to see if they could help and also suggested websites they could visit to request med help.   Anticipated DC Date:  02/02/2013   Anticipated DC Plan:  HOME/SELF CARE      DC Planning Services  CM consult      Choice offered to / List presented to:             Status of service:  Completed, signed off Medicare Important Message given?  YES (If response is "NO", the following Medicare IM given date fields will be blank) Date Medicare IM given:  02/02/2013 Date Additional Medicare IM given:    Discharge Disposition:  HOME/SELF CARE  Per UR Regulation:    If discussed at Long Length of Stay Meetings, dates discussed:    Comments:  02/02/13 1020 Arlyss Queen, RN BSN CM

## 2013-02-28 ENCOUNTER — Encounter (HOSPITAL_COMMUNITY): Payer: Self-pay

## 2013-02-28 ENCOUNTER — Emergency Department (HOSPITAL_COMMUNITY)
Admission: EM | Admit: 2013-02-28 | Discharge: 2013-03-01 | Disposition: A | Discharge status: Home / Self Care | Payer: Medicare Other | Attending: Emergency Medicine | Admitting: Emergency Medicine

## 2013-02-28 DIAGNOSIS — E119 Type 2 diabetes mellitus without complications: Secondary | ICD-10-CM | POA: Insufficient documentation

## 2013-02-28 DIAGNOSIS — Z79899 Other long term (current) drug therapy: Secondary | ICD-10-CM | POA: Insufficient documentation

## 2013-02-28 DIAGNOSIS — Z8679 Personal history of other diseases of the circulatory system: Secondary | ICD-10-CM | POA: Insufficient documentation

## 2013-02-28 DIAGNOSIS — G9340 Encephalopathy, unspecified: Secondary | ICD-10-CM | POA: Insufficient documentation

## 2013-02-28 DIAGNOSIS — J449 Chronic obstructive pulmonary disease, unspecified: Secondary | ICD-10-CM | POA: Insufficient documentation

## 2013-02-28 DIAGNOSIS — Z7982 Long term (current) use of aspirin: Secondary | ICD-10-CM | POA: Insufficient documentation

## 2013-02-28 DIAGNOSIS — M129 Arthropathy, unspecified: Secondary | ICD-10-CM | POA: Insufficient documentation

## 2013-02-28 DIAGNOSIS — F17200 Nicotine dependence, unspecified, uncomplicated: Secondary | ICD-10-CM | POA: Insufficient documentation

## 2013-02-28 DIAGNOSIS — Z8639 Personal history of other endocrine, nutritional and metabolic disease: Secondary | ICD-10-CM | POA: Insufficient documentation

## 2013-02-28 DIAGNOSIS — R410 Disorientation, unspecified: Secondary | ICD-10-CM

## 2013-02-28 DIAGNOSIS — K7460 Unspecified cirrhosis of liver: Secondary | ICD-10-CM | POA: Insufficient documentation

## 2013-02-28 DIAGNOSIS — J4489 Other specified chronic obstructive pulmonary disease: Secondary | ICD-10-CM | POA: Insufficient documentation

## 2013-02-28 DIAGNOSIS — Z8669 Personal history of other diseases of the nervous system and sense organs: Secondary | ICD-10-CM | POA: Insufficient documentation

## 2013-02-28 DIAGNOSIS — Z880 Allergy status to penicillin: Secondary | ICD-10-CM | POA: Insufficient documentation

## 2013-02-28 DIAGNOSIS — Z8719 Personal history of other diseases of the digestive system: Secondary | ICD-10-CM | POA: Insufficient documentation

## 2013-02-28 DIAGNOSIS — K219 Gastro-esophageal reflux disease without esophagitis: Secondary | ICD-10-CM | POA: Insufficient documentation

## 2013-02-28 DIAGNOSIS — F29 Unspecified psychosis not due to a substance or known physiological condition: Secondary | ICD-10-CM | POA: Insufficient documentation

## 2013-02-28 DIAGNOSIS — F411 Generalized anxiety disorder: Secondary | ICD-10-CM | POA: Insufficient documentation

## 2013-02-28 DIAGNOSIS — F319 Bipolar disorder, unspecified: Secondary | ICD-10-CM | POA: Insufficient documentation

## 2013-02-28 DIAGNOSIS — Z862 Personal history of diseases of the blood and blood-forming organs and certain disorders involving the immune mechanism: Secondary | ICD-10-CM | POA: Insufficient documentation

## 2013-02-28 DIAGNOSIS — G8929 Other chronic pain: Secondary | ICD-10-CM | POA: Insufficient documentation

## 2013-02-28 DIAGNOSIS — I10 Essential (primary) hypertension: Secondary | ICD-10-CM | POA: Insufficient documentation

## 2013-02-28 LAB — CBC WITH DIFFERENTIAL/PLATELET
Basophils Absolute: 0 10*3/uL (ref 0.0–0.1)
Basophils Relative: 1 % (ref 0–1)
Eosinophils Absolute: 0.1 10*3/uL (ref 0.0–0.7)
Eosinophils Relative: 2 % (ref 0–5)
HCT: 28.6 % — ABNORMAL LOW (ref 39.0–52.0)
Hemoglobin: 9 g/dL — ABNORMAL LOW (ref 13.0–17.0)
MCH: 26.5 pg (ref 26.0–34.0)
MCHC: 31.5 g/dL (ref 30.0–36.0)
MCV: 84.4 fL (ref 78.0–100.0)
Monocytes Absolute: 0.5 10*3/uL (ref 0.1–1.0)
Monocytes Relative: 14 % — ABNORMAL HIGH (ref 3–12)
Neutro Abs: 1 10*3/uL — ABNORMAL LOW (ref 1.7–7.7)
RDW: 18.9 % — ABNORMAL HIGH (ref 11.5–15.5)

## 2013-02-28 LAB — URINALYSIS, ROUTINE W REFLEX MICROSCOPIC
Hgb urine dipstick: NEGATIVE
Ketones, ur: 15 mg/dL — AB
Nitrite: NEGATIVE
Specific Gravity, Urine: 1.029 (ref 1.005–1.030)
Urobilinogen, UA: 1 mg/dL (ref 0.0–1.0)

## 2013-02-28 MED ORDER — SODIUM CHLORIDE 0.9 % IV BOLUS (SEPSIS): 1000.0000 mL | Freq: Once | INTRAVENOUS | Status: DC

## 2013-02-28 NOTE — ED Notes (Signed)
Pt given a urinal and aware urine is needed.

## 2013-02-28 NOTE — ED Notes (Signed)
Family at bedside. 

## 2013-02-28 NOTE — ED Notes (Signed)
Dr. [facility] instructed pt. To come to our ED for abnormal labs and to be evaluated by our EDPS. Pt. Has chronice pain due to arthritis and is alert and oriented X4

## 2013-02-28 NOTE — ED Provider Notes (Signed)
History    CSN: [id number] Arrival date & time 02/28/13  1845  First MD Initiated Contact with Patient 02/28/13 1957     Chief Complaint  Patient presents with  . Abnormal Lab   (Consider location/radiation/quality/duration/timing/severity/associated sxs/prior Treatment) HPI  Patient presents with family members who can do a concern for an abnormal lab done at the primary care physician's office. The patient himself denies pain, new confusion, new dyspnea, new lightheadedness. The patient has history of both encephalopathy and bipolar disorder as well as prior stroke and at baseline has diminished cognitive capacity. History of present illness is per the patient and his wife. She states that the patient continues to have episodes of incontinence, and occasionally urinates inside the house. This seems to have been consistent for greater than one month. The patient also continues to have other idiosyncratic behavior, including possible visual hallucinations. The patient has no new report of fever, nausea, vomiting, chills, dyspnea, chest pain. Beyond his previously mentioned psychoses, he has history of thrombocytopenia, diabetes, hypertension. The patient had a scheduled outpatient visit today, and labs from that visit raised concern for some abnormality, the patient does not know. Past Medical History  Diagnosis Date  . Depression   . 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 Bipolar 1 disorder   . Hypertension   . Diabetes mellitus   . Seizures   . COPD (chronic obstructive pulmonary disease)   . Mental retardation   . Blind right eye   . GERD (gastroesophageal reflux disease)   . Chronic back pain   . Hepatic encephalopathy   . Anemia   . Thrombocytopenia   . Cardiomegaly   . Anxiety   . Arthritis   . Cholelithiasis     Asymptomatic  . Cerebrovascular disease     Right vertebral artery  . Hard of hearing   . Hyperlipidemia   . Cataract     Right eye  . Blood clots in  brain     per patient  . Cirrhosis     diagnosed 07/2010 when presented with first episode of hepatic encephalopathy, afp on 416/13=4.8,  U/S on 12/14/11  liver stable, pt has received 2 hep A/B vaccines  . Diverticulosis    Past Surgical History  Procedure Laterality Date  . Tonsillectomy    . Ear operations    . Cast application  11/29/2008    Procedure: MINOR CAST APPLICATION;  Surgeon: [name], MD;  Location: [facility];  Service: Orthopedics;  Laterality: Right;  no anesthesia , procedure room do not need or room !  . Orif ankle fracture  08/27/2011    Procedure: OPEN REDUCTION INTERNAL FIXATION (ORIF) ANKLE FRACTURE;  Surgeon: [name], MD;  Location: [facility];  Service: Orthopedics;  Laterality: Right;  . Esophagogastroduodenoscopy  02/02/2011    Rourk-Normal esophagus.  No varices/Question mild portal gastropathy, extrinsic compression on the antrum, lesser curvature of uncertain significance, some minimally  nodular mucosa status post biopsy, patent pylorus, normal duodenum 1 and duodenum 2.  . Colonoscopy  12/15/11    colonic divericulosis;poor prep, ACBE  recommended but has not been done  . Esophagogastroduodenoscopy  12/15/11    Rourk-->mild changes of portal gastropathy, gastric/duodenal erosions s/p bx  (reactive changes with mild chronic inflammation. No H.pylori  . Foot surgery     Family History  Problem Relation Age of Onset  . Heart failure Mother   . Thyroid disease Mother   . Cancer Father   . Asthma  Brother   . Heart attack Brother   . Cirrhosis Brother     EtOH  . Colon cancer Maternal Aunt    History  Substance Use Topics  . Smoking status: Current Every Day Smoker -- 1.00 packs/day for 30 years    Types: Cigarettes  . Smokeless tobacco: Not on file     Comment: only smokes about 1-2 a day  . Alcohol Use: No    Review of Systems  Constitutional:       Per HPI, otherwise negative  HENT:       Per HPI, otherwise negative  Respiratory:        Per HPI, otherwise negative  Cardiovascular:       Per HPI, otherwise negative  Gastrointestinal: Negative for vomiting.  Endocrine:       Negative aside from HPI  Genitourinary:       Neg aside from HPI   Musculoskeletal:       Per HPI, otherwise negative  Skin: Negative.   Neurological: Negative for syncope.    Allergies  Penicillins  Home Medications   Current Outpatient Rx  Name  Route  Sig  Dispense  Refill  . ALPRAZolam (XANAX) 0.5 MG tablet   Oral   Take 0.5 mg by mouth 2 (two) times daily.         Marland Kitchen aspirin EC 81 MG tablet   Oral   Take 81 mg by mouth daily.         . Cholecalciferol (VITAMIN D) 2000 UNITS tablet   Oral   Take 1 tablet (2,000 Units total) by mouth daily.   30 tablet   0   . FLUoxetine (PROZAC) 20 MG capsule   Oral   Take 1 capsule (20 mg total) by mouth daily.   30 capsule   0   . furosemide (LASIX) 20 MG tablet   Oral   Take 1 tablet (20 mg total) by mouth 2 (two) times daily.   30 tablet   0   . HYDROcodone-acetaminophen (NORCO/VICODIN) 5-325 MG per tablet   Oral   Take 1 tablet by mouth 3 (three) times daily.         Marland Kitchen lactulose (CHRONULAC) 10 GM/15ML solution   Oral   Take 60 mLs (40 g total) by mouth 3 (three) times daily. Titrate for 3-4 bowel motions daily.   240 mL   0   . levETIRAcetam (KEPPRA) 500 MG tablet   Oral   Take 1 tablet (500 mg total) by mouth 2 (two) times daily.   60 tablet   0   . metoprolol tartrate (LOPRESSOR) 25 MG tablet   Oral   Take 25-50 mg by mouth 2 (two) times daily. Take 25 mg in the morning and 50 mg at night         . Multiple Vitamin (MULTIVITAMIN WITH MINERALS) TABS   Oral   Take 1 tablet by mouth daily.         Marland Kitchen omega-3 acid ethyl esters (LOVAZA) 1 G capsule   Oral   Take 1 capsule (1 g total) by mouth 2 (two) times daily.   60 capsule   0   . omeprazole (PRILOSEC) 20 MG capsule   Oral   Take 1 capsule (20 mg total) by mouth daily.   30 capsule   0   .  potassium chloride SA (K-DUR,KLOR-CON) 20 MEQ tablet   Oral   Take 1 tablet (20 mEq total) by mouth daily.  30 tablet   0    BP 129/69  Pulse 68  Temp(Src) 99 F (37.2 C) (Oral)  Resp 20  SpO2 98% Physical Exam  Nursing note and vitals reviewed. Constitutional:  Male much older than stated age with cataract in right eye, resting comfortably in bed.  HENT:  Head: Normocephalic and atraumatic.  Eyes:  Right eye cataract, left eye unremarkable  Neck: No tracheal deviation present.  Cardiovascular: Normal rate and regular rhythm.   Murmur heard. Pulmonary/Chest: Effort normal. No stridor. No respiratory distress.  Abdominal: Soft. Bowel sounds are normal. He exhibits distension. There is no tenderness.  Musculoskeletal:  No gross deformities  Neurological:  Patient is oriented to self, time, to place. He denies ongoing hallucinations. He has insight into his condition. He was ultimately spontaneously. No asterixis  Skin: Skin is warm and dry.  Psychiatric:  Patient was drawn, but answers questions appropriately, briefly.  He has some insight into his condition.     ED Course  Procedures (including critical care time) Labs Reviewed  CBC WITH DIFFERENTIAL - Abnormal; Notable for the following:    WBC 3.3 (*)    RBC 3.39 (*)    Hemoglobin 9.0 (*)    HCT 28.6 (*)    RDW 18.9 (*)    Platelets 69 (*)    Neutrophils Relative % 31 (*)    Neutro Abs 1.0 (*)    Lymphocytes Relative 51 (*)    Monocytes Relative 14 (*)    All other components within normal limits  URINALYSIS, ROUTINE W REFLEX MICROSCOPIC - Abnormal; Notable for the following:    Color, Urine AMBER (*)    APPearance CLOUDY (*)    Bilirubin Urine SMALL (*)    Ketones, ur 15 (*)    All other components within normal limits  AMMONIA  LIPASE, BLOOD  CG4 I-STAT (LACTIC ACID)   No results found. 1. Confusion    after the initial evaluation I reviewed the patient's chart. I confirmed with the patient and  his family members that he is taking all medication as directed, including lactulose.   Pulse ox 97% room air normal  Update: On repeat exam the patient states that he would like to go home. I reviewed labs with him and his wife.  MDM  This patient with multiple medical problems, including encephalopathy, cirrhosis, mental retardation, bipolar disease, diabetes, hypertension now presents with concern for possible abnormal lab value.  Today's repeat labs are consistent with multiple prior evaluations, including a few weeks ago a recent hospitalization for somewhat similar concerns.  The patient's description of similar activities, the denial of particularly new events or new evidence of distress is reassuring.  Although the patient will benefit from additional services, and was provided a referral to home health, is no indication for admission.  The patient was discharged in stable condition to follow up with his primary care physician and counseling team  Carmin Muskrat, MD 02/28/13 2321

## 2013-03-01 NOTE — ED Notes (Signed)
IV removed cath intact site without redness or swelling

## 2013-04-17 ENCOUNTER — Emergency Department (HOSPITAL_COMMUNITY): Payer: Medicare Other

## 2013-04-17 ENCOUNTER — Encounter (HOSPITAL_COMMUNITY): Payer: Self-pay

## 2013-04-17 ENCOUNTER — Inpatient Hospital Stay (HOSPITAL_COMMUNITY)
Admission: EM | Admit: 2013-04-17 | Discharge: 2013-04-19 | DRG: 442 | Disposition: A | Discharge status: Home Health | Payer: Medicare Other | Attending: Internal Medicine | Admitting: Internal Medicine

## 2013-04-17 DIAGNOSIS — E119 Type 2 diabetes mellitus without complications: Secondary | ICD-10-CM | POA: Diagnosis present

## 2013-04-17 DIAGNOSIS — R32 Unspecified urinary incontinence: Secondary | ICD-10-CM | POA: Diagnosis present

## 2013-04-17 DIAGNOSIS — D696 Thrombocytopenia, unspecified: Secondary | ICD-10-CM

## 2013-04-17 DIAGNOSIS — M549 Dorsalgia, unspecified: Secondary | ICD-10-CM | POA: Diagnosis present

## 2013-04-17 DIAGNOSIS — R569 Unspecified convulsions: Secondary | ICD-10-CM | POA: Diagnosis present

## 2013-04-17 DIAGNOSIS — H5440 Blindness, one eye, unspecified eye: Secondary | ICD-10-CM | POA: Diagnosis present

## 2013-04-17 DIAGNOSIS — I679 Cerebrovascular disease, unspecified: Secondary | ICD-10-CM | POA: Diagnosis present

## 2013-04-17 DIAGNOSIS — F411 Generalized anxiety disorder: Secondary | ICD-10-CM | POA: Diagnosis present

## 2013-04-17 DIAGNOSIS — M129 Arthropathy, unspecified: Secondary | ICD-10-CM | POA: Diagnosis present

## 2013-04-17 DIAGNOSIS — K7290 Hepatic failure, unspecified without coma: Principal | ICD-10-CM | POA: Diagnosis present

## 2013-04-17 DIAGNOSIS — G8929 Other chronic pain: Secondary | ICD-10-CM | POA: Diagnosis present

## 2013-04-17 DIAGNOSIS — F79 Unspecified intellectual disabilities: Secondary | ICD-10-CM | POA: Diagnosis present

## 2013-04-17 DIAGNOSIS — K219 Gastro-esophageal reflux disease without esophagitis: Secondary | ICD-10-CM | POA: Diagnosis present

## 2013-04-17 DIAGNOSIS — J4489 Other specified chronic obstructive pulmonary disease: Secondary | ICD-10-CM | POA: Diagnosis present

## 2013-04-17 DIAGNOSIS — F319 Bipolar disorder, unspecified: Secondary | ICD-10-CM | POA: Diagnosis present

## 2013-04-17 DIAGNOSIS — G40909 Epilepsy, unspecified, not intractable, without status epilepticus: Secondary | ICD-10-CM

## 2013-04-17 DIAGNOSIS — J449 Chronic obstructive pulmonary disease, unspecified: Secondary | ICD-10-CM | POA: Diagnosis present

## 2013-04-17 DIAGNOSIS — I10 Essential (primary) hypertension: Secondary | ICD-10-CM | POA: Diagnosis present

## 2013-04-17 DIAGNOSIS — E785 Hyperlipidemia, unspecified: Secondary | ICD-10-CM | POA: Diagnosis present

## 2013-04-17 DIAGNOSIS — H9190 Unspecified hearing loss, unspecified ear: Secondary | ICD-10-CM | POA: Diagnosis present

## 2013-04-17 DIAGNOSIS — D649 Anemia, unspecified: Secondary | ICD-10-CM

## 2013-04-17 DIAGNOSIS — K7682 Hepatic encephalopathy: Principal | ICD-10-CM | POA: Diagnosis present

## 2013-04-17 DIAGNOSIS — K7460 Unspecified cirrhosis of liver: Secondary | ICD-10-CM | POA: Diagnosis present

## 2013-04-17 DIAGNOSIS — D689 Coagulation defect, unspecified: Secondary | ICD-10-CM | POA: Diagnosis present

## 2013-04-17 DIAGNOSIS — F17200 Nicotine dependence, unspecified, uncomplicated: Secondary | ICD-10-CM | POA: Diagnosis present

## 2013-04-17 DIAGNOSIS — D61818 Other pancytopenia: Secondary | ICD-10-CM | POA: Diagnosis present

## 2013-04-17 LAB — CBC WITH DIFFERENTIAL/PLATELET
Basophils Relative: 1 % (ref 0–1)
Eosinophils Absolute: 0.1 10*3/uL (ref 0.0–0.7)
HCT: 32.6 % — ABNORMAL LOW (ref 39.0–52.0)
Hemoglobin: 9.9 g/dL — ABNORMAL LOW (ref 13.0–17.0)
Lymphocytes Relative: 29 % (ref 12–46)
Lymphs Abs: 0.9 10*3/uL (ref 0.7–4.0)
MCH: 25.8 pg — ABNORMAL LOW (ref 26.0–34.0)
MCHC: 30.4 g/dL (ref 30.0–36.0)
MCV: 84.9 fL (ref 78.0–100.0)
Monocytes Absolute: 0.3 10*3/uL (ref 0.1–1.0)
Neutro Abs: 1.7 10*3/uL (ref 1.7–7.7)

## 2013-04-17 LAB — COMPREHENSIVE METABOLIC PANEL
ALT: 13 U/L (ref 0–53)
CO2: 27 mEq/L (ref 19–32)
Calcium: 9 mg/dL (ref 8.4–10.5)
GFR calc Af Amer: >90 mL/min (ref >90)
GFR calc non Af Amer: >90 mL/min (ref >90)
Glucose, Bld: 90 mg/dL (ref 70–99)
Sodium: 141 mEq/L (ref 135–145)

## 2013-04-17 MED ORDER — METOPROLOL TARTRATE 25 MG PO TABS: 25.0000 mg | ORAL_TABLET | Freq: Two times a day (BID) | ORAL | Status: DC

## 2013-04-17 MED ORDER — FLUOXETINE HCL 20 MG PO CAPS
20.0000 mg | ORAL_CAPSULE | Freq: Every day | ORAL | Status: DC
  Administered 2013-04-18 – 2013-04-19 (×2): 20 mg via ORAL
  Filled 2013-04-17 (×6): qty 1

## 2013-04-17 MED ORDER — ONDANSETRON HCL 4 MG PO TABS: 4.0000 mg | ORAL_TABLET | Freq: Four times a day (QID) | ORAL | Status: DC | PRN

## 2013-04-17 MED ORDER — INSULIN ASPART 100 UNIT/ML ~~LOC~~ SOLN: 0.0000 [IU] | Freq: Three times a day (TID) | SUBCUTANEOUS | Status: DC

## 2013-04-17 MED ORDER — POTASSIUM CHLORIDE CRYS ER 20 MEQ PO TBCR
20.0000 meq | EXTENDED_RELEASE_TABLET | Freq: Every day | ORAL | Status: DC
  Administered 2013-04-18 – 2013-04-19 (×2): 20 meq via ORAL
  Filled 2013-04-17 (×3): qty 1

## 2013-04-17 MED ORDER — LACTULOSE 10 GM/15ML PO SOLN
40.0000 g | Freq: Three times a day (TID) | ORAL | Status: DC
  Administered 2013-04-18 (×3): 40 g via ORAL
  Filled 2013-04-17: qty 30
  Filled 2013-04-17 (×3): qty 60
  Filled 2013-04-17: qty 30

## 2013-04-17 MED ORDER — SODIUM CHLORIDE 0.9 % IV SOLN: INTRAVENOUS | Status: DC

## 2013-04-17 MED ORDER — PANTOPRAZOLE SODIUM 40 MG PO TBEC
40.0000 mg | DELAYED_RELEASE_TABLET | Freq: Every day | ORAL | Status: DC
  Administered 2013-04-18 – 2013-04-19 (×2): 40 mg via ORAL
  Filled 2013-04-17 (×3): qty 1

## 2013-04-17 MED ORDER — ASPIRIN EC 81 MG PO TBEC
81.0000 mg | DELAYED_RELEASE_TABLET | Freq: Every day | ORAL | Status: DC
  Administered 2013-04-18 – 2013-04-19 (×2): 81 mg via ORAL
  Filled 2013-04-17 (×6): qty 1

## 2013-04-17 MED ORDER — ONDANSETRON HCL 4 MG/2ML IJ SOLN: 4.0000 mg | Freq: Four times a day (QID) | INTRAMUSCULAR | Status: DC | PRN

## 2013-04-17 MED ORDER — LEVETIRACETAM 500 MG PO TABS
500.0000 mg | ORAL_TABLET | Freq: Two times a day (BID) | ORAL | Status: DC
  Administered 2013-04-18 – 2013-04-19 (×3): 500 mg via ORAL
  Filled 2013-04-17 (×8): qty 1

## 2013-04-17 MED ORDER — OMEGA-3 FATTY ACIDS 1000 MG PO CAPS
1.0000 g | ORAL_CAPSULE | Freq: Three times a day (TID) | ORAL | Status: DC
  Filled 2013-04-17 (×7): qty 1

## 2013-04-17 MED ORDER — VITAMIN D 1000 UNITS PO TABS
2000.0000 [IU] | ORAL_TABLET | Freq: Every day | ORAL | Status: DC
  Administered 2013-04-18 – 2013-04-19 (×2): 2000 [IU] via ORAL
  Filled 2013-04-17 (×2): qty 1
  Filled 2013-04-17: qty 2
  Filled 2013-04-17: qty 1
  Filled 2013-04-17: qty 2

## 2013-04-17 MED ORDER — METOPROLOL TARTRATE 25 MG PO TABS
25.0000 mg | ORAL_TABLET | Freq: Two times a day (BID) | ORAL | Status: DC
  Administered 2013-04-18 – 2013-04-19 (×3): 25 mg via ORAL
  Filled 2013-04-17 (×4): qty 1

## 2013-04-17 MED ORDER — INSULIN ASPART 100 UNIT/ML ~~LOC~~ SOLN: 0.0000 [IU] | Freq: Every day | SUBCUTANEOUS | Status: DC

## 2013-04-17 MED ORDER — FLUOXETINE HCL 20 MG PO CAPS: 20.0000 mg | ORAL_CAPSULE | Freq: Every day | ORAL | Status: DC

## 2013-04-17 MED ORDER — LEVETIRACETAM 500 MG PO TABS
ORAL_TABLET | ORAL | Status: AC
  Filled 2013-04-17: qty 1
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
## 2013-04-17 NOTE — ED Provider Notes (Signed)
CSN: [id number]     Arrival date & time 04/17/13  1614 History  This chart was scribed for [name] Lennert, MD by Ronal Fear, ED Scribe. This patient was seen in room APA18/APA18 and the patient's care was started at 4:52 PM.    Chief Complaint  Patient presents with  . Altered Mental Status    Patient is a 52 y.o. male presenting with altered mental status. The history is provided by the spouse. No language interpreter was used.  Altered Mental Status Presenting symptoms: confusion   Most recent episode:  Yesterday Episode history:  Single (once before due to liver failure) Timing:  Constant Progression:  Unchanged Associated symptoms: no abdominal pain, no hallucinations, no headaches, no rash and no seizures    HPI Comments: RASHAWN [name] is a 52 y.o. male with a history of cirrhosis and mental retardation who was brought in by EMS to the Emergency Department complaining of AMS, presenting as confusion, onset last night. Pt's sister is in the room providing the history, and she states that pt was acting normally last night. Pt has a history of similar prior occurrences, and pt' sister attributes them to liver failure. Pt takes 40 g of lactulose 3 times a day. He is not oriented to place, and believes he is at [facility]. 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 HE also has a cataract and reduced vision in his left eye.    Past Medical History  Diagnosis Date  . Depression   . 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 Bipolar 1 disorder   . Hypertension   . Diabetes mellitus   . Seizures   . COPD (chronic obstructive pulmonary disease)   . Mental retardation   . Blind right eye   . GERD (gastroesophageal reflux disease)   . Chronic back pain   . Hepatic encephalopathy   . Anemia   . Thrombocytopenia   . Cardiomegaly   . Anxiety   . Arthritis   . Cholelithiasis     Asymptomatic  . Cerebrovascular disease      Right vertebral artery  . Hard of hearing   . Hyperlipidemia   . Cataract     Right eye  . Blood clots in brain     per patient  . Cirrhosis     diagnosed 07/2010 when presented with first episode of hepatic encephalopathy, afp on 416/13=4.8,  U/S on 12/14/11  liver stable, pt has received 2 hep A/B vaccines  . Diverticulosis    Past Surgical History  Procedure Laterality Date  . Tonsillectomy    . Ear operations    . Cast application  08/30/2011    Procedure: MINOR CAST APPLICATION;  Surgeon: Fuller Canada, MD;  Location: [facility];  Service: Orthopedics;  Laterality: Right;  no anesthesia , procedure room do not need or room !  . Orif ankle fracture  08/27/2011    Procedure: OPEN REDUCTION INTERNAL FIXATION (ORIF) ANKLE FRACTURE;  Surgeon: Fuller Canada, MD;  Location: [facility];  Service: Orthopedics;  Laterality: Right;  . Esophagogastroduodenoscopy  02/02/2011    Rourk-Normal esophagus.  No varices/Question mild portal gastropathy, extrinsic compression on the antrum, lesser curvature of uncertain significance, some minimally  nodular mucosa status post biopsy, patent pylorus, normal duodenum 1 and duodenum 2.  . Colonoscopy  12/15/11    colonic divericulosis;poor prep, ACBE  recommended but  has not been done  . Esophagogastroduodenoscopy  12/15/11    Rourk-->mild changes of portal gastropathy, gastric/duodenal erosions s/p bx  (reactive changes with mild chronic inflammation. No H.pylori  . Foot surgery     Family History  Problem Relation Age of Onset  . Heart failure Mother   . Thyroid disease Mother   . Cancer Father   . Asthma Brother   . Heart attack Brother   . Cirrhosis Brother     EtOH  . Colon cancer Maternal Aunt    History  Substance Use Topics  . Smoking status: Current Every Day Smoker -- 1.00 packs/day for 30 years    Types: Cigarettes  . Smokeless tobacco: Not on file     Comment: only smokes about 1-2 a day  . Alcohol Use: No    Review of Systems   Constitutional: Negative for appetite change and fatigue.  HENT: Negative for congestion, sinus pressure and ear discharge.   Eyes: Negative for discharge.  Respiratory: Negative for cough.   Cardiovascular: Negative for chest pain.  Gastrointestinal: Negative for abdominal pain and diarrhea.  Genitourinary: Negative for frequency and hematuria.  Musculoskeletal: Negative for back pain.  Skin: Negative for rash.  Neurological: Negative for seizures and headaches.  Psychiatric/Behavioral: Positive for confusion. Negative for hallucinations.    Allergies  Penicillins  Home Medications   Current Outpatient Rx  Name  Route  Sig  Dispense  Refill  . ALPRAZolam (XANAX) 0.5 MG tablet   Oral   Take 0.5 mg by mouth 2 (two) times daily.         Marland Kitchen aspirin EC 81 MG tablet   Oral   Take 81 mg by mouth daily.         . Cholecalciferol (VITAMIN D) 2000 UNITS tablet   Oral   Take 1 tablet (2,000 Units total) by mouth daily.   30 tablet   0   . fish oil-omega-3 fatty acids 1000 MG capsule   Oral   Take 1 g by mouth 3 (three) times daily.         Marland Kitchen FLUoxetine (PROZAC) 20 MG capsule   Oral   Take 1 capsule (20 mg total) by mouth daily.   30 capsule   0   . FLUoxetine (PROZAC) 20 MG capsule   Oral   Take 20 mg by mouth daily.         Marland Kitchen HYDROcodone-acetaminophen (NORCO/VICODIN) 5-325 MG per tablet   Oral   Take 1 tablet by mouth 3 (three) times daily.         Marland Kitchen lactulose (CHRONULAC) 10 GM/15ML solution   Oral   Take 60 mLs (40 g total) by mouth 3 (three) times daily. Titrate for 3-4 bowel motions daily.   240 mL   0   . levETIRAcetam (KEPPRA) 500 MG tablet   Oral   Take 1 tablet (500 mg total) by mouth 2 (two) times daily.   60 tablet   0   . metoprolol tartrate (LOPRESSOR) 25 MG tablet   Oral   Take 25-50 mg by mouth 2 (two) times daily. Take 25 mg in the morning and 50 mg at night         . Multiple Vitamin (MULTIVITAMIN WITH MINERALS) TABS   Oral    Take 1 tablet by mouth daily.         Marland Kitchen omeprazole (PRILOSEC) 20 MG capsule   Oral   Take 1 capsule (20 mg total) by mouth  daily.   30 capsule   0   . potassium chloride SA (K-DUR,KLOR-CON) 20 MEQ tablet   Oral   Take 1 tablet (20 mEq total) by mouth daily.   30 tablet   0    Triage Vitals: BP 151/107  Pulse 62  Temp(Src) 97.4 F (36.3 C) (Oral)  Resp 22  SpO2 100%  Physical Exam  Nursing note and vitals reviewed. Constitutional: He appears well-developed and well-nourished. No distress.  HENT:  Head: Normocephalic and atraumatic.  Eyes: EOM are normal.  Large cataract in right eye  Neck: Neck supple. No tracheal deviation present.  Cardiovascular: Normal rate and normal heart sounds.   Pulmonary/Chest: Effort normal. No respiratory distress.  Abdominal: He exhibits distension. There is no tenderness.  Musculoskeletal: Normal range of motion.  Contractures in legs, moves all extremeities  Neurological: He is alert.  lethargic and oriented to person only  Skin: Skin is warm and dry.  Psychiatric: He has a normal mood and affect. His behavior is normal.    ED Course  Procedures (including critical care time)  DIAGNOSTIC STUDIES: Oxygen Saturation is 100% on RA, normal by my interpretation.    COORDINATION OF CARE: 4:53 PM- Pt advised of plan for diagnostic lab work and radiology and pt agrees.   Labs Review Labs Reviewed  CBC WITH DIFFERENTIAL - Abnormal; Notable for the following:    WBC 3.0 (*)    RBC 3.84 (*)    Hemoglobin 9.9 (*)    HCT 32.6 (*)    MCH 25.8 (*)    RDW 17.9 (*)    Platelets 46 (*)    All other components within normal limits  COMPREHENSIVE METABOLIC PANEL - Abnormal; Notable for the following:    Albumin 2.8 (*)    Alkaline Phosphatase 136 (*)    Total Bilirubin 1.9 (*)    All other components within normal limits  AMMONIA - Abnormal; Notable for the following:    Ammonia 84 (*)    All other components within normal limits   URINALYSIS, ROUTINE W REFLEX MICROSCOPIC   Imaging Review Dg Chest 1 View  04/17/2013   *RADIOLOGY REPORT*  Clinical Data: Altered mental status, weakness, history of COPD and smoking  CHEST - 1 VIEW  Comparison: 09/17/2012; 01/28/2011; 10/05/2010  Findings: Grossly unchanged borderline enlarged cardiac silhouette and mediastinal contours.  There is grossly unchanged mild diffuse slightly nodular thickening of the interstitium.  No focal airspace opacity.  No pleural effusion or pneumothorax.  The evidence of eight.  Grossly unchanged bones.  IMPRESSION: Mild bronchitic change without definite acute cardiopulmonary disease.  Further evaluation with a PA and lateral chest radiograph may be obtained as clinically indicated.   Original Report Authenticated By: Tacey Ruiz, MD   Ct Head Wo Contrast  04/17/2013   *RADIOLOGY REPORT*  Clinical Data: Altered mental status  CT HEAD WITHOUT CONTRAST  Technique:  Contiguous axial images were obtained from the base of the skull through the vertex without contrast.  Comparison: 01/31/2013  Findings: Ventricle size is normal.  Negative for acute or chronic infarction.  Negative for hemorrhage mass or edema.  Chronic calcification of the right globe is unchanged.  No acute bony abnormality. Prior mastoidectomy bilaterally.  IMPRESSION: No acute intracranial abnormality.   Original Report Authenticated By: Janeece Riggers, M.D.    MDM  No diagnosis found.  The chart was scribed for me under my direct supervision.  I personally performed the history, physical, and medical decision making and  all procedures in the evaluation of this patient.[name] Lennert, MD 04/17/13 2005

## 2013-04-17 NOTE — ED Notes (Signed)
Pt here from home for eval of AMS. Per EMS, sister stated pt was " normal  " last night at 11:pm. She left him in the care of her brother but he never checked on the pt. Stated when she returned pt was confused. Pt keeps repeating he is at [facility] and he lives at [facility]

## 2013-04-17 NOTE — H&P (Signed)
[facility] History and Physical  CORIAN HANDLEY AVW:[id number] DOB: [birth year] DOA: 04/17/2013  Referring physician: ER PCP: Mitzi Hansen, NP  Specialists: Dr. Kendell Bane.  Chief Complaint: Altered mental status.  HPI: [name] [name] is a 52 y.o. male who is known to have cirrhosis of the liver secondary to [facility] who presents today with confusion. He has a history of hepatic encephalopathy previously when he has not been taking his lactulose. His wife, who is at the bedside, adamantly confirms that he has been taking his lactulose recently. He did not have any fever. There is no cough or dyspnea. The wife did notice the patient becomes stiff and his eyes rolled backwards but there was no shaking. This was an episode she noticed today amongst his confusion. He has been in bed all day long and has not walked. He is now being admitted for further management. The patient cannot give any real history.  Review of Systems:  Patient is unable to give me any review of systems secondary to his altered mental status.  Past Medical History  Diagnosis Date  . Depression   . 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 Bipolar 1 disorder   . Hypertension   . Diabetes mellitus   . Seizures   . COPD (chronic obstructive pulmonary disease)   . Mental retardation   . Blind right eye   . GERD (gastroesophageal reflux disease)   . Chronic back pain   . Hepatic encephalopathy   . Anemia   . Thrombocytopenia   . Cardiomegaly   . Anxiety   . Arthritis   . Cholelithiasis     Asymptomatic  . Cerebrovascular disease     Right vertebral artery  . Hard of hearing   . Hyperlipidemia   . Cataract     Right eye  . Blood clots in brain     per patient  . Cirrhosis     diagnosed 07/2010 when presented with first episode of hepatic encephalopathy, afp on 416/13=4.8,  U/S on 12/14/11  liver stable, pt has received 2 hep A/B vaccines  . Diverticulosis    Past Surgical History  Procedure Laterality Date  .  Tonsillectomy    . Ear operations    . Cast application  08/30/2011    Procedure: MINOR CAST APPLICATION;  Surgeon: Fuller Canada, MD;  Location: [facility];  Service: Orthopedics;  Laterality: Right;  no anesthesia , procedure room do not need or room !  . Orif ankle fracture  08/27/2011    Procedure: OPEN REDUCTION INTERNAL FIXATION (ORIF) ANKLE FRACTURE;  Surgeon: Fuller Canada, MD;  Location: [facility];  Service: Orthopedics;  Laterality: Right;  . Esophagogastroduodenoscopy  02/02/2011    Rourk-Normal esophagus.  No varices/Question mild portal gastropathy, extrinsic compression on the antrum, lesser curvature of uncertain significance, some minimally  nodular mucosa status post biopsy, patent pylorus, normal duodenum 1 and duodenum 2.  . Colonoscopy  12/15/11    colonic divericulosis;poor prep, ACBE  recommended but has not been done  . Esophagogastroduodenoscopy  12/15/11    Rourk-->mild changes of portal gastropathy, gastric/duodenal erosions s/p bx  (reactive changes with mild chronic inflammation. No H.pylori  . Foot surgery     Social History:  reports that he has been smoking Cigarettes.  He has a 30 pack-year smoking history. He does not have any smokeless tobacco history on file. He reports that he does not drink alcohol or use illicit drugs.   Allergies  Allergen Reactions  .  Penicillins Rash    Family History  Problem Relation Age of Onset  . Heart failure Mother   . Thyroid disease Mother   . Cancer Father   . Asthma Brother   . Heart attack Brother   . Cirrhosis Brother     EtOH  . Colon cancer Maternal Aunt       Prior to Admission medications   Medication Sig Start Date End Date Taking? Authorizing Provider  ALPRAZolam Prudy Feeler) 0.5 MG tablet Take 0.5 mg by mouth 2 (two) times daily.   Yes Historical Provider, MD  aspirin EC 81 MG tablet Take 81 mg by mouth daily.   Yes Historical Provider, MD  Cholecalciferol (VITAMIN D) 2000 UNITS tablet Take 1 tablet (2,000 Units  total) by mouth daily. 09/20/12  Yes Chayne Baumgart Normajean Glasgow, MD  fish oil-omega-3 fatty acids 1000 MG capsule Take 1 g by mouth 3 (three) times daily.   Yes Historical Provider, MD  FLUoxetine (PROZAC) 20 MG capsule Take 1 capsule (20 mg total) by mouth daily. 09/20/12  Yes Rhylie Stehr Normajean Glasgow, MD  FLUoxetine (PROZAC) 20 MG capsule Take 20 mg by mouth daily.   Yes Historical Provider, MD  HYDROcodone-acetaminophen (NORCO/VICODIN) 5-325 MG per tablet Take 1 tablet by mouth 3 (three) times daily.   Yes Historical Provider, MD  lactulose (CHRONULAC) 10 GM/15ML solution Take 60 mLs (40 g total) by mouth 3 (three) times daily. Titrate for 3-4 bowel motions daily. 02/02/13  Yes Rayleigh Gillyard Normajean Glasgow, MD  levETIRAcetam (KEPPRA) 500 MG tablet Take 1 tablet (500 mg total) by mouth 2 (two) times daily. 09/20/12  Yes Boen Sterbenz Normajean Glasgow, MD  metoprolol tartrate (LOPRESSOR) 25 MG tablet Take 25-50 mg by mouth 2 (two) times daily. Take 25 mg in the morning and 50 mg at night   Yes Historical Provider, MD  Multiple Vitamin (MULTIVITAMIN WITH MINERALS) TABS Take 1 tablet by mouth daily.   Yes Historical Provider, MD  omeprazole (PRILOSEC) 20 MG capsule Take 1 capsule (20 mg total) by mouth daily. 09/20/12  Yes Tenishia Ekman Normajean Glasgow, MD  potassium chloride SA (K-DUR,KLOR-CON) 20 MEQ tablet Take 1 tablet (20 mEq total) by mouth daily. 09/20/12  Yes Jamontae Thwaites Normajean Glasgow, MD   Physical Exam: Filed Vitals:   04/17/13 2036  BP: 138/66  Pulse: 69  Temp:   Resp: 22     General:  He is confused. However he is responding to his name.  Eyes: No pallor. No jaundice.  ENT: No abnormalities.  Neck: No lymphadenopathy.  Cardiovascular: Heart sounds are in sinus rhythm. No murmurs.  Respiratory: Lung fields are clear.  Abdomen: Soft, nontender. No hepatosplenomegaly.  Skin: No rash.  Musculoskeletal: No acute joint abnormalities.  Psychiatric: Not examined.  Neurologic: Confused. Not orientated in place time and person. No obvious focal  neurological signs, however.  Labs on Admission:  Basic Metabolic Panel:  Recent Labs Lab 04/17/13 1719  NA 141  K 3.8  CL 106  CO2 27  GLUCOSE 90  BUN 8  CREATININE 0.73  CALCIUM 9.0   Liver Function Tests:  Recent Labs Lab 04/17/13 1719  AST 35  ALT 13  ALKPHOS 136*  BILITOT 1.9*  PROT 6.7  ALBUMIN 2.8*     Recent Labs Lab 04/17/13 1719  AMMONIA 84*   CBC:  Recent Labs Lab 04/17/13 1719  WBC 3.0*  NEUTROABS 1.7  HGB 9.9*  HCT 32.6*  MCV 84.9  PLT 46*      Radiological Exams on Admission: Dg  Chest 1 View  04/17/2013   *RADIOLOGY REPORT*  Clinical Data: Altered mental status, weakness, history of COPD and smoking  CHEST - 1 VIEW  Comparison: 09/17/2012; 01/28/2011; 10/05/2010  Findings: Grossly unchanged borderline enlarged cardiac silhouette and mediastinal contours.  There is grossly unchanged mild diffuse slightly nodular thickening of the interstitium.  No focal airspace opacity.  No pleural effusion or pneumothorax.  The evidence of eight.  Grossly unchanged bones.  IMPRESSION: Mild bronchitic change without definite acute cardiopulmonary disease.  Further evaluation with a PA and lateral chest radiograph may be obtained as clinically indicated.   Original Report Authenticated By: Tacey Ruiz, MD   Ct Head Wo Contrast  04/17/2013   *RADIOLOGY REPORT*  Clinical Data: Altered mental status  CT HEAD WITHOUT CONTRAST  Technique:  Contiguous axial images were obtained from the base of the skull through the vertex without contrast.  Comparison: 01/31/2013  Findings: Ventricle size is normal.  Negative for acute or chronic infarction.  Negative for hemorrhage mass or edema.  Chronic calcification of the right globe is unchanged.  No acute bony abnormality. Prior mastoidectomy bilaterally.  IMPRESSION: No acute intracranial abnormality.   Original Report Authenticated By: Janeece Riggers, M.D.      Assessment/Plan   1. Hepatic encephalopathy, unclear  trigger. 2. History from the wife points towards a possible seizure episode. 3. Cirrhosis of the liver secondary to [facility]. 4. Pancytopenia secondary to #3. 5. Hypertension. 6. Diabetes mellitus.  Plan: 1. Admit.  2. Intravenous fluids. Urinalysis. 3. Continue with lactulose. 4. EEG. 5. Neurology consultation.  Further recommendations will depend on patient's hospital progress.   Code Status: Full code.  Family Communication: Discussed plan with patient's wife at the bedside.   Disposition Plan: Home when medically stable.   Time spent: 60 minutes.  Wilson Singer [facility] Pager [phone].  If 7PM-7AM, please contact night-coverage [email or web address] Password [facility] 04/17/2013, 8:57 PM

## 2013-04-18 ENCOUNTER — Inpatient Hospital Stay (HOSPITAL_COMMUNITY)
Admit: 2013-04-18 | Discharge: 2013-04-18 | Disposition: A | Discharge status: Home / Self Care | Payer: Medicare Other | Attending: Internal Medicine | Admitting: Internal Medicine

## 2013-04-18 DIAGNOSIS — D649 Anemia, unspecified: Secondary | ICD-10-CM

## 2013-04-18 LAB — COMPREHENSIVE METABOLIC PANEL
Albumin: 2.5 g/dL — ABNORMAL LOW (ref 3.5–5.2)
Alkaline Phosphatase: 124 U/L — ABNORMAL HIGH (ref 39–117)
BUN: 11 mg/dL (ref 6–23)
Calcium: 8.9 mg/dL (ref 8.4–10.5)
Potassium: 4 mEq/L (ref 3.5–5.1)
Sodium: 143 mEq/L (ref 135–145)
Total Protein: 6.2 g/dL (ref 6.0–8.3)

## 2013-04-18 LAB — URINALYSIS, ROUTINE W REFLEX MICROSCOPIC
Bilirubin Urine: NEGATIVE
Hgb urine dipstick: NEGATIVE
Protein, ur: NEGATIVE mg/dL
Urobilinogen, UA: 0.2 mg/dL (ref 0.0–1.0)

## 2013-04-18 LAB — CBC
HCT: 30.5 % — ABNORMAL LOW (ref 39.0–52.0)
Hemoglobin: 9.4 g/dL — ABNORMAL LOW (ref 13.0–17.0)
MCV: 85.4 fL (ref 78.0–100.0)
RBC: 3.57 MIL/uL — ABNORMAL LOW (ref 4.22–5.81)
RDW: 18.2 % — ABNORMAL HIGH (ref 11.5–15.5)
WBC: 2.9 10*3/uL — ABNORMAL LOW (ref 4.0–10.5)

## 2013-04-18 LAB — GLUCOSE, CAPILLARY
Glucose-Capillary: 95 mg/dL (ref 70–99)
Glucose-Capillary: 136 mg/dL — ABNORMAL HIGH (ref 70–99)

## 2013-04-18 LAB — PROTIME-INR: INR: 1.87 — ABNORMAL HIGH (ref 0.00–1.49)

## 2013-04-18 MED ORDER — OMEGA-3-ACID ETHYL ESTERS 1 G PO CAPS
1.0000 g | ORAL_CAPSULE | Freq: Three times a day (TID) | ORAL | Status: DC
  Administered 2013-04-18 – 2013-04-19 (×4): 1 g via ORAL
  Filled 2013-04-18 (×3): qty 1

## 2013-04-18 MED ORDER — SODIUM CHLORIDE 0.9 % IV SOLN: 250.0000 mL | INTRAVENOUS | Status: DC | PRN

## 2013-04-18 MED ORDER — SODIUM CHLORIDE 0.9 % IJ SOLN: 3.0000 mL | INTRAMUSCULAR | Status: DC | PRN

## 2013-04-18 MED ORDER — SODIUM CHLORIDE 0.9 % IJ SOLN
INTRAMUSCULAR | Status: AC
  Filled 2013-04-18: qty 3

## 2013-04-18 MED ORDER — SODIUM CHLORIDE 0.9 % IJ SOLN
3.0000 mL | Freq: Two times a day (BID) | INTRAMUSCULAR | Status: DC
  Administered 2013-04-18 – 2013-04-19 (×3): 3 mL via INTRAVENOUS

## 2013-04-18 NOTE — Progress Notes (Signed)
UR chart review completed.  

## 2013-04-18 NOTE — Progress Notes (Signed)
[facility] PROGRESS NOTE  [name] QQP:[id number] DOB: 11/23/1960 DOA: 04/17/2013 PCP: Carolee Rota, NP  HPI: [name] [name] is a 52 y.o. male with NASH cirrhosis of the liver secondary to [place] who presented with HE and possible seizure.   Assessment/Plan: HE - on lactulose, improving, today wife feels like he is close to baseline. Not entirely clear as to what triggered his decompensation. No apparent infectious source. Appears compliant with lactulose per wife. No reported constipation.  NASH cirrhosis - with decompensation as per #1. Monitor.  Possible seizure - appreciate neurology consult.  Pancytopenia - due to liver disease DM - SSI. Last HBA1C 5.2 in 2013. Recheck.  HTN - continue home medications.    Code Status: Full Family Communication: wife  Disposition Plan: home when medically ready, likely in 1-2 days  Consultants:  Neurology  Procedures:  none  Anti-infectives   None     Antibiotics Given (last 72 hours)   None      HPI/Subjective: - feeling well today  Objective: Filed Vitals:   04/17/13 2000 04/17/13 2036 04/17/13 2159 04/18/13 0638  BP: 155/73 138/66 124/56 151/81  Pulse:  69 62 57  Temp:   98.4 F (36.9 C) 97.9 F (36.6 C)  TempSrc:   Oral Oral  Resp:  22 20 20   Height:   5' 7"  (1.702 m)   Weight:   90.5 kg (199 lb 8.3 oz)   SpO2:  96% 98% 100%    Intake/Output Summary (Last 24 hours) at 04/18/13 1420 Last data filed at 04/18/13 1124  Gross per 24 hour  Intake    243 ml  Output    200 ml  Net     43 ml   Filed Weights   04/17/13 2159  Weight: 90.5 kg (199 lb 8.3 oz)    Exam:   General:  NAD, very hard of hearing  Cardiovascular: regular rate and rhythm, without MRG  Respiratory: good air movement, clear to auscultation throughout, no wheezing, ronchi or rales  Abdomen: soft, not tender to palpation, positive bowel sounds  MSK: no peripheral edema  Neuro: AxOx4, no focal findings.   Data  Reviewed: Basic Metabolic Panel:  Recent Labs Lab 04/17/13 1719 04/18/13 0518  NA 141 143  K 3.8 4.0  CL 106 109  CO2 27 28  GLUCOSE 90 85  BUN 8 11  CREATININE 0.73 0.70  CALCIUM 9.0 8.9   Liver Function Tests:  Recent Labs Lab 04/17/13 1719 04/18/13 0518  AST 35 30  ALT 13 11  ALKPHOS 136* 124*  BILITOT 1.9* 1.8*  PROT 6.7 6.2  ALBUMIN 2.8* 2.5*    Recent Labs Lab 04/17/13 1719  AMMONIA 84*   CBC:  Recent Labs Lab 04/17/13 1719 04/18/13 0518  WBC 3.0* 2.9*  NEUTROABS 1.7  --   HGB 9.9* 9.4*  HCT 32.6* 30.5*  MCV 84.9 85.4  PLT 46* 42*   CBG:  Recent Labs Lab 04/17/13 2252 04/18/13 0731 04/18/13 1116  GLUCAP 86 95 136*    Studies: Dg Chest 1 View  04/17/2013   *RADIOLOGY REPORT*  Clinical Data: Altered mental status, weakness, history of COPD and smoking  CHEST - 1 VIEW  Comparison: 09/17/2012; 01/28/2011; 10/05/2010  Findings: Grossly unchanged borderline enlarged cardiac silhouette and mediastinal contours.  There is grossly unchanged mild diffuse slightly nodular thickening of the interstitium.  No focal airspace opacity.  No pleural effusion or pneumothorax.  The evidence of eight.  Grossly unchanged bones.  IMPRESSION:  Mild bronchitic change without definite acute cardiopulmonary disease.  Further evaluation with a PA and lateral chest radiograph may be obtained as clinically indicated.   Original Report Authenticated By: Jake Seats, MD   Ct Head Wo Contrast  04/17/2013   *RADIOLOGY REPORT*  Clinical Data: Altered mental status  CT HEAD WITHOUT CONTRAST  Technique:  Contiguous axial images were obtained from the base of the skull through the vertex without contrast.  Comparison: 01/31/2013  Findings: Ventricle size is normal.  Negative for acute or chronic infarction.  Negative for hemorrhage mass or edema.  Chronic calcification of the right globe is unchanged.  No acute bony abnormality. Prior mastoidectomy bilaterally.  IMPRESSION: No acute  intracranial abnormality.   Original Report Authenticated By: Carl Best, M.D.    Scheduled Meds: . aspirin EC  81 mg Oral Daily  . cholecalciferol  2,000 Units Oral Daily  . FLUoxetine  20 mg Oral Daily  . insulin aspart  0-20 Units Subcutaneous TID WC  . insulin aspart  0-5 Units Subcutaneous QHS  . lactulose  40 g Oral TID  . levETIRAcetam  500 mg Oral BID  . metoprolol tartrate  25 mg Oral BID  . omega-3 acid ethyl esters  1 g Oral TID  . pantoprazole  40 mg Oral Daily  . potassium chloride SA  20 mEq Oral Daily  . sodium chloride  3 mL Intravenous Q12H   Continuous Infusions:   Active Problems:   DM (diabetes mellitus)   HTN (hypertension)   Cirrhosis   Hepatic encephalopathy   Pancytopenia   Urinary incontinence   Time spent: [place], MD [facility] Pager [phone]. If 7 PM - 7 AM, please contact night-coverage at [email or web address], password [facility] 04/18/2013, 2:20 PM  LOS: 1 day

## 2013-04-18 NOTE — Progress Notes (Signed)
EEG Completed; Results Pending  

## 2013-04-18 NOTE — Care Management Note (Signed)
    Page 1 of 1   04/19/2013     1:28:19 PM   CARE MANAGEMENT NOTE 04/19/2013  Patient:  SHONDELL, POULSON   Account Number:  [id number]  Date Initiated:  04/18/2013  Documentation initiated by:  Sharrie Rothman  Subjective/Objective Assessment:   Pt admitted from home with hepatic encephalopathy. Pt lives with his wife and wants to return home at discharge. Pt uses a cane for home use. Pt is very HOH and has poor eyesight.     Action/Plan:   Pt may benefit from [facility] RN and possibly PT at discharge. Will continue to follow for [facility] needs.   Anticipated DC Date:  04/20/2013   Anticipated DC Plan:  HOME W HOME HEALTH SERVICES      DC Planning Services  CM consult      Choice offered to / List presented to:             Status of service:  Completed, signed off Medicare Important Message given?  NA - LOS <3 / Initial given by admissions (If response is "NO", the following Medicare IM given date fields will be blank) Date Medicare IM given:   Date Additional Medicare IM given:    Discharge Disposition:  HOME/SELF CARE  Per UR Regulation:    If discussed at Long Length of Stay Meetings, dates discussed:    Comments:  04/19/13 1327 Arlyss Queen, RN BSN CM Pt discharged home today. No CM needs noted.  04/18/13 1100 Arlyss Queen, RN BSN CM

## 2013-04-19 DIAGNOSIS — G40909 Epilepsy, unspecified, not intractable, without status epilepticus: Secondary | ICD-10-CM

## 2013-04-19 DIAGNOSIS — D696 Thrombocytopenia, unspecified: Secondary | ICD-10-CM

## 2013-04-19 DIAGNOSIS — D689 Coagulation defect, unspecified: Secondary | ICD-10-CM

## 2013-04-19 LAB — BASIC METABOLIC PANEL
BUN: 11 mg/dL (ref 6–23)
Calcium: 9 mg/dL (ref 8.4–10.5)
Creatinine, Ser: 0.68 mg/dL (ref 0.50–1.35)
GFR calc Af Amer: >90 mL/min (ref >90)
GFR calc non Af Amer: >90 mL/min (ref >90)
Glucose, Bld: 144 mg/dL — ABNORMAL HIGH (ref 70–99)
Potassium: 3.6 mEq/L (ref 3.5–5.1)

## 2013-04-19 LAB — GLUCOSE, CAPILLARY
Glucose-Capillary: 85 mg/dL (ref 70–99)
Glucose-Capillary: 88 mg/dL (ref 70–99)

## 2013-04-19 LAB — CBC
HCT: 30.8 % — ABNORMAL LOW (ref 39.0–52.0)
Hemoglobin: 9.4 g/dL — ABNORMAL LOW (ref 13.0–17.0)
MCH: 26.2 pg (ref 26.0–34.0)
MCHC: 30.5 g/dL (ref 30.0–36.0)
MCV: 85.8 fL (ref 78.0–100.0)
RDW: 18.7 % — ABNORMAL HIGH (ref 11.5–15.5)

## 2013-04-19 MED ORDER — LEVETIRACETAM 500 MG PO TABS: 750.0000 mg | ORAL_TABLET | Freq: Two times a day (BID) | ORAL | Status: DC

## 2013-04-19 NOTE — Discharge Summary (Signed)
Physician Discharge Summary  FOSTER FRERICKS HDQ:[id number] DOB: July 11, 1961 DOA: 04/17/2013  PCP: Carolee Rota, NP  Admit date: 04/17/2013 Discharge date: 04/19/2013  Time spent: 45 minutes  Recommendations for Outpatient Follow-up:  1. Followup with her primary care provider in one week  Recommendations for primary care physician for things to follow:  Monitor medication compliance Repeat CBC, CMP, INR  Discharge Diagnoses:  Active Problems:   DM (diabetes mellitus)   HTN (hypertension)   Cirrhosis   Hepatic encephalopathy   Pancytopenia   Urinary incontinence  Discharge Condition: Stable  Diet recommendation: Heart healthy  Filed Weights   04/17/13 2159  Weight: 90.5 kg (199 lb 8.3 oz)   History of present illness:  [name] [name] is a 52 y.o. male who is known to have cirrhosis of the liver secondary to [place] who presents today with confusion. He has a history of hepatic encephalopathy previously when he has not been taking his lactulose. His wife, who is at the bedside, adamantly confirms that he has been taking his lactulose recently. He did not have any fever. There is no cough or dyspnea. The wife did notice the patient becomes stiff and his eyes rolled backwards but there was no shaking. This was an episode she noticed today amongst his confusion. He has been in bed all day long and has not walked. He is now being admitted for further management. The patient cannot give any real history.  Hospital Course:  Seizure event - neurology has been consulted we'll patient was hospitalized, and his Keppra was increased from 500 mg twice a day to 750 mg twice daily. Patient was seizure-free while hospitalized. HE - unclear etiology, infectious workup was negative. Patient did endorse at one point that he hasn't taken his lactulose over a few days, however his wife - who is managing his medications - denies that. His mental status has improved on lactulose while hospitalized, and was  back to baseline on discharge.  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 Stable on discharge. Follow up as an outpatient with PCP and GI.  Pancytopenia - due to liver disease, recommend continuous monitoring as an outpatient. Coagulopathy - no evidence of bleeding, to be monitored as an outpatient. DM - SSI. Last HBA1C 5.1  HTN - continue home medications.   Procedures:  EEG IMPRESSION: This is an abnormal recording showing moderate to severe generalized slowing, and also some triphasic wave. The combined type picture is indicative for moderate-to-severe encephalopathy typically a sign of hepatic failure or renal failure and no epileptiform activity is observed however.   Consultations:  Neurology  Discharge Exam: Filed Vitals:   04/18/13 0638 04/18/13 1410 04/18/13 2200 04/19/13 0459  BP: 151/81 145/75 135/66 125/50  Pulse: 57 62 67 60  Temp: 97.9 F (36.6 C) 98 F (36.7 C) 98.2 F (36.8 C) 98.3 F (36.8 C)  TempSrc: Oral Oral Oral Oral  Resp: 20 20 20 20   Height:      Weight:      SpO2: 100% 97% 98% 100%    General: NAD Cardiovascular: RRR Respiratory: CTA biL  Discharge Instructions     Medication List         ALPRAZolam 0.5 MG tablet  Commonly known as:  XANAX  Take 0.5 mg by mouth 2 (two) times daily.     aspirin EC 81 MG tablet  Take 81 mg by mouth daily.     fish oil-omega-3 fatty acids 1000 MG capsule  Take 1 g by  mouth 3 (three) times daily.     FLUoxetine 20 MG capsule  Commonly known as:  PROZAC  Take 1 capsule (20 mg total) by mouth daily.     FLUoxetine 20 MG capsule  Commonly known as:  PROZAC  Take 20 mg by mouth daily.     HYDROcodone-acetaminophen 5-325 MG per tablet  Commonly known as:  NORCO/VICODIN  Take 1 tablet by mouth 3 (three) times daily.     lactulose 10 GM/15ML solution  Commonly known as:  CHRONULAC  Take 60 mLs (40 g total) by mouth 3 (three) times daily. Titrate for 3-4 bowel motions daily.     levETIRAcetam  500 MG tablet  Commonly known as:  KEPPRA  Take 1.5 tablets (750 mg total) by mouth 2 (two) times daily.     metoprolol tartrate 25 MG tablet  Commonly known as:  LOPRESSOR  Take 25-50 mg by mouth 2 (two) times daily. Take 25 mg in the morning and 50 mg at night     multivitamin with minerals Tabs tablet  Take 1 tablet by mouth daily.     omeprazole 20 MG capsule  Commonly known as:  PRILOSEC  Take 1 capsule (20 mg total) by mouth daily.     potassium chloride SA 20 MEQ tablet  Commonly known as:  K-DUR,KLOR-CON  Take 1 tablet (20 mEq total) by mouth daily.     Vitamin D 2000 UNITS tablet  Take 1 tablet (2,000 Units total) by mouth daily.           Follow-up Information   Follow up with [facility], NP On 04/27/2013. (at 10:30 am)    Specialty:  Nurse Practitioner   Contact information:   [place] [id number] [phone]       Follow up with Phillips Odor, MD On 05/16/2013. (at 11:45 am)    Specialty:  Neurology   Contact information:   [address] [place] [address] [phone]       The results of significant diagnostics from this hospitalization (including imaging, microbiology, ancillary and laboratory) are listed below for reference.    Significant Diagnostic Studies: Dg Chest 1 View  04/17/2013   *RADIOLOGY REPORT*  Clinical Data: Altered mental status, weakness, history of COPD and smoking  CHEST - 1 VIEW  Comparison: 09/17/2012; 01/28/2011; 10/05/2010  Findings: Grossly unchanged borderline enlarged cardiac silhouette and mediastinal contours.  There is grossly unchanged mild diffuse slightly nodular thickening of the interstitium.  No focal airspace opacity.  No pleural effusion or pneumothorax.  The evidence of eight.  Grossly unchanged bones.  IMPRESSION: Mild bronchitic change without definite acute cardiopulmonary disease.  Further evaluation with a PA and lateral chest radiograph may be obtained as clinically indicated.    Original Report Authenticated By: Jake Seats, MD   Ct Head Wo Contrast  04/17/2013   *RADIOLOGY REPORT*  Clinical Data: Altered mental status  CT HEAD WITHOUT CONTRAST  Technique:  Contiguous axial images were obtained from the base of the skull through the vertex without contrast.  Comparison: 01/31/2013  Findings: Ventricle size is normal.  Negative for acute or chronic infarction.  Negative for hemorrhage mass or edema.  Chronic calcification of the right globe is unchanged.  No acute bony abnormality. Prior mastoidectomy bilaterally.  IMPRESSION: No acute intracranial abnormality.   Original Report Authenticated By: Carl Best, M.D.    Microbiology: No results found for this or any previous visit (from the past 240 hour(s)).   Labs:  Basic Metabolic Panel:  Recent Labs Lab 04/17/13 1719 04/18/13 0518 04/19/13 0534  NA 141 143 140  K 3.8 4.0 3.6  CL 106 109 106  CO2 27 28 27   GLUCOSE 90 85 144*  BUN 8 11 11   CREATININE 0.73 0.70 0.68  CALCIUM 9.0 8.9 9.0   Liver Function Tests:  Recent Labs Lab 04/17/13 1719 04/18/13 0518  AST 35 30  ALT 13 11  ALKPHOS 136* 124*  BILITOT 1.9* 1.8*  PROT 6.7 6.2  ALBUMIN 2.8* 2.5*   No results found for this basename: LIPASE, AMYLASE,  in the last 168 hours  Recent Labs Lab 04/17/13 1719  AMMONIA 84*   CBC:  Recent Labs Lab 04/17/13 1719 04/18/13 0518 04/19/13 0534  WBC 3.0* 2.9* 2.6*  NEUTROABS 1.7  --   --   HGB 9.9* 9.4* 9.4*  HCT 32.6* 30.5* 30.8*  MCV 84.9 85.4 85.8  PLT 46* 42* 45*   Cardiac Enzymes: No results found for this basename: CKTOTAL, CKMB, CKMBINDEX, TROPONINI,  in the last 168 hours BNP: BNP (last 3 results) No results found for this basename: PROBNP,  in the last 8760 hours CBG:  Recent Labs Lab 04/18/13 1116 04/18/13 1645 04/18/13 2155 04/19/13 0726 04/19/13 1135  GLUCAP 136* 85 107* 88 100*       Signed:  Jennamarie Goings  [facility] 04/19/2013, 1:32 PM

## 2013-04-19 NOTE — Procedures (Signed)
[facility] Kinsley Nicklaus A. Gerilyn Pilgrim, MD     [email or web address]        NAMERAPHEL, STICKLES               ACCOUNT NO.:  [id number]  MEDICAL RECORD NO.:  [id number]  LOCATION:  EE                           FACILITY:  [facility]  PHYSICIAN:  Hanzel Pizzo A. Gerilyn Pilgrim, M.D. DATE OF BIRTH:  Mar 30, 1961  DATE OF PROCEDURE: DATE OF DISCHARGE:  04/18/2013                             EEG INTERPRETATION   HISTORY:  This 52 year old man with history of seizures who presents with spell of unresponsiveness and stiffening of the extremities suspicious for seizure activity.  MEDICATIONS:  Aspirin, vitamin D, fluoxetine, insulin, lactulose, Keppra, metoprolol, omega-3 fatty acids, Protonix, potassium.  ANALYSIS:  A 16 channel recording using standard 10/20 measurements is conducted for 22 minutes.  The background activity gets as high as 6 hertz, but it generally replete with generalized delta slowing.  There was also triphasic wave seen throughout the recording from time to time. There was no focal slowing.  There was no lateralized slowing or epileptiform activity observed.  Photic stimulation and hyperventilation were not done.  IMPRESSION:  This is an abnormal recording showing moderate to severe generalized slowing, and also some triphasic wave.  The combined type picture is indicative for moderate-to-severe encephalopathy typically a sign of hepatic failure or renal failure and no epileptiform activity is observed however.     Jerome Viglione A. Gerilyn Pilgrim, M.D.     KAD/MEDQ  D:  04/19/2013  T:  04/19/2013  Job:  [id number]

## 2013-04-19 NOTE — Consult Note (Addendum)
[facility] [name] Urbieta A. Gerilyn Pilgrim, [name] [name]     [email or web address]          [name] [name] is an 52 y.o. male.   ASSESSMENT/PLAN: 1. The semiology the patient event seems most consistent with a seizure. Consequently, the patient's Keppra will be increased from 500 mg twice a day to 750 mg twice a day. 2. Hepatic encephalopathy resolved.    This is a 52 year old man who had a spell witnessed by his wife agree became suddenly unresponsive eyes, rolled back, and stiffening of the extremities. The patient was confused after the event. 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 The etiology of the hepatic disease is unclear although the think this may be due to a new type of hepatitis. The history is obtained from the chart and the patient. The patient has severe hearing impairment seemed to have limited insight which limits the history. However, he tells me that he has been diagnosed with seizures. The patient tells me that his wife found him several times on the floor unresponsive and his vision started him on Keppra. He reports that the Keppra has been quite successful and he has not had an event in over 6 months although is not quite certain. He tells me that he has not had one in the long time. The patient's ammonia level was elevated although he reports that he has been taking the medication. He does not report other complaints today. He is noted to have abdomen ounces of the right eye. He reports that this developed at age 6 and probable inherited as his father had the same problem. The review of systems is limited due to hearing impairment and limited insight. However, he does not report other complaints at this time.    GENERAL: This is a very pleasant man who is in no acute distress.  HEENT: The right eye is fixed and dilated with a marked corneal clouding and whitish discoloration. The patient is severely hearing impaired. He does not have his hearing aid  in.  ABDOMEN: soft  EXTREMITIES: No edema   BACK: Unremarkable.  SKIN: Normal by inspection.    MENTAL STATUS: The patient is awake and alert. He cooperates with evaluation with much prompting. He speaks in full sentences and generally seems coherent.  CRANIAL NERVES: Left pupil is 4 mm and reactive; extra ocular movements are full, there is no significant nystagmus; visual fields are full; upper and lower facial muscles are normal in strength and symmetric, there is no flattening of the nasolabial folds; tongue is midline; uvula is midline; shoulder elevation is normal.  MOTOR: This is somewhat limited due to difficulty with communication but he generally seems to have good strength throughout. Tone and bulk are normal throughout.  COORDINATION: Left finger to nose is normal, right finger to nose is normal, No rest tremor; no intention tremor; no postural tremor; no bradykinesia.  REFLEXES: Deep tendon reflexes are symmetrical and normal. Plantar responses are flexor bilaterally.   SENSATION: Unreliable.  EEG is reviewed and shows moderate to severe slowing but no epileptiform activity.   Past Medical History  Diagnosis Date  . Depression   . 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 Bipolar 1 disorder   . Hypertension   . Diabetes mellitus   . Seizures   . COPD (chronic obstructive pulmonary disease)   . Mental retardation   . Blind right eye   . GERD (gastroesophageal reflux disease)   .  Chronic back pain   . Hepatic encephalopathy   . Anemia   . Thrombocytopenia   . Cardiomegaly   . Anxiety   . Arthritis   . Cholelithiasis     Asymptomatic  . Cerebrovascular disease     Right vertebral artery  . Hard of hearing   . Hyperlipidemia   . Cataract     Right eye  . Blood clots in brain     per patient  . Cirrhosis     diagnosed 07/2010 when presented with first episode of hepatic encephalopathy, afp on 416/13=4.8,  U/S on 12/14/11  liver stable, pt has received 2 hep A/B  vaccines  . Diverticulosis     Past Surgical History  Procedure Laterality Date  . Tonsillectomy    . Ear operations    . Cast application  08/30/2011    Procedure: MINOR CAST APPLICATION;  Surgeon: Fuller Canada, [name] [name];  Location: [facility];  Service: Orthopedics;  Laterality: Right;  no anesthesia , procedure room do not need or room !  . Orif ankle fracture  08/27/2011    Procedure: OPEN REDUCTION INTERNAL FIXATION (ORIF) ANKLE FRACTURE;  Surgeon: Fuller Canada, [name] [name];  Location: [facility];  Service: Orthopedics;  Laterality: Right;  . Esophagogastroduodenoscopy  02/02/2011    Rourk-Normal esophagus.  No varices/Question mild portal gastropathy, extrinsic compression on the antrum, lesser curvature of uncertain significance, some minimally  nodular mucosa status post biopsy, patent pylorus, normal duodenum 1 and duodenum 2.  . Colonoscopy  12/15/11    colonic divericulosis;poor prep, ACBE  recommended but has not been done  . Esophagogastroduodenoscopy  12/15/11    Rourk-->mild changes of portal gastropathy, gastric/duodenal erosions s/p bx  (reactive changes with mild chronic inflammation. No H.pylori  . Foot surgery      Family History  Problem Relation Age of Onset  . Heart failure Mother   . Thyroid disease Mother   . Cancer Father   . Asthma Brother   . Heart attack Brother   . Cirrhosis Brother     EtOH  . Colon cancer Maternal Aunt     Social History:  reports that he has been smoking Cigarettes.  He has a 30 pack-year smoking history. He does not have any smokeless tobacco history on file. He reports that he does not drink alcohol or use illicit drugs.  Allergies:  Allergies  Allergen Reactions  . Penicillins Rash    Medications: Prior to Admission medications   Medication Sig Start Date End Date Taking? Authorizing Provider  ALPRAZolam Prudy Feeler) 0.5 MG tablet Take 0.5 mg by mouth 2 (two) times daily.   Yes Historical Provider, [name] [name]  aspirin EC 81 MG tablet Take 81 mg by mouth  daily.   Yes Historical Provider, [name] [name]  Cholecalciferol (VITAMIN D) 2000 UNITS tablet Take 1 tablet (2,000 Units total) by mouth daily. 09/20/12  Yes Nimish Normajean Glasgow, [name] [name]  fish oil-omega-3 fatty acids 1000 MG capsule Take 1 g by mouth 3 (three) times daily.   Yes Historical Provider, [name] [name]  FLUoxetine (PROZAC) 20 MG capsule Take 1 capsule (20 mg total) by mouth daily. 09/20/12  Yes Nimish Normajean Glasgow, [name] [name]  FLUoxetine (PROZAC) 20 MG capsule Take 20 mg by mouth daily.   Yes Historical Provider, [name] [name]  HYDROcodone-acetaminophen (NORCO/VICODIN) 5-325 MG per tablet Take 1 tablet by mouth 3 (three) times daily.   Yes Historical Provider, [name] [name]  lactulose (CHRONULAC) 10 GM/15ML solution Take 60 mLs (40 g total) by mouth 3 (three) times daily. Titrate  for 3-4 bowel motions daily. 02/02/13  Yes Nimish Normajean Glasgow, [name] [name]  levETIRAcetam (KEPPRA) 500 MG tablet Take 1 tablet (500 mg total) by mouth 2 (two) times daily. 09/20/12  Yes Nimish Normajean Glasgow, [name] [name]  metoprolol tartrate (LOPRESSOR) 25 MG tablet Take 25-50 mg by mouth 2 (two) times daily. Take 25 mg in the morning and 50 mg at night   Yes Historical Provider, [name] [name]  Multiple Vitamin (MULTIVITAMIN WITH MINERALS) TABS Take 1 tablet by mouth daily.   Yes Historical Provider, [name] [name]  omeprazole (PRILOSEC) 20 MG capsule Take 1 capsule (20 mg total) by mouth daily. 09/20/12  Yes Nimish Normajean Glasgow, [name] [name]  potassium chloride SA (K-DUR,KLOR-CON) 20 MEQ tablet Take 1 tablet (20 mEq total) by mouth daily. 09/20/12  Yes Nimish Normajean Glasgow, [name] [name]    Scheduled Meds: . aspirin EC  81 mg Oral Daily  . cholecalciferol  2,000 Units Oral Daily  . FLUoxetine  20 mg Oral Daily  . insulin aspart  0-20 Units Subcutaneous TID WC  . insulin aspart  0-5 Units Subcutaneous QHS  . lactulose  40 g Oral TID  . levETIRAcetam  500 mg Oral BID  . metoprolol tartrate  25 mg Oral BID  . omega-3 acid ethyl esters  1 g Oral TID  . pantoprazole  40 mg Oral Daily  . potassium chloride SA  20 mEq Oral Daily  . sodium chloride  3  mL Intravenous Q12H   Continuous Infusions:  PRN Meds:.sodium chloride, ondansetron (ZOFRAN) IV, ondansetron, sodium chloride   Blood pressure 125/50, pulse 60, temperature 98.3 F (36.8 C), temperature source Oral, resp. rate 20, height 5\' 7"  (1.702 m), weight 90.5 kg (199 lb 8.3 oz), SpO2 100.00%.   Results for orders placed during the hospital encounter of 04/17/13 (from the past 48 hour(s))  CBC WITH DIFFERENTIAL     Status: Abnormal   Collection Time    04/17/13  5:19 PM      Result Value Range   WBC 3.0 (*) 4.0 - 10.5 K/uL   RBC 3.84 (*) 4.22 - 5.81 MIL/uL   Hemoglobin 9.9 (*) 13.0 - 17.0 g/dL   HCT 16.1 (*) 09.6 - 04.5 %   MCV 84.9  78.0 - 100.0 fL   MCH 25.8 (*) 26.0 - 34.0 pg   MCHC 30.4  30.0 - 36.0 g/dL   RDW 40.9 (*) 81.1 - 91.4 %   Platelets 46 (*) 150 - 400 K/uL   Neutrophils Relative % 56  43 - 77 %   Lymphocytes Relative 29  12 - 46 %   Monocytes Relative 10  3 - 12 %   Eosinophils Relative 4  0 - 5 %   Basophils Relative 1  0 - 1 %   Neutro Abs 1.7  1.7 - 7.7 K/uL   Lymphs Abs 0.9  0.7 - 4.0 K/uL   Monocytes Absolute 0.3  0.1 - 1.0 K/uL   Eosinophils Absolute 0.1  0.0 - 0.7 K/uL   Basophils Absolute 0.0  0.0 - 0.1 K/uL   RBC Morphology POLYCHROMASIA PRESENT     Smear Review PLATELETS APPEAR ADEQUATE     Comment: PLATELETS APPEAR DECREASED  COMPREHENSIVE METABOLIC PANEL     Status: Abnormal   Collection Time    04/17/13  5:19 PM      Result Value Range   Sodium 141  135 - 145 mEq/L   Potassium 3.8  3.5 - 5.1 mEq/L   Chloride 106  96 - 112 mEq/L  CO2 27  19 - 32 mEq/L   Glucose, Bld 90  70 - 99 mg/dL   BUN 8  6 - 23 mg/dL   Creatinine, Ser 7.82  0.50 - 1.35 mg/dL   Calcium 9.0  8.4 - 95.6 mg/dL   Total Protein 6.7  6.0 - 8.3 g/dL   Albumin 2.8 (*) 3.5 - 5.2 g/dL   AST 35  0 - 37 U/L   ALT 13  0 - 53 U/L   Alkaline Phosphatase 136 (*) 39 - 117 U/L   Total Bilirubin 1.9 (*) 0.3 - 1.2 mg/dL   GFR calc non Af Amer >90  >90 mL/min   GFR calc Af Amer  >90  >90 mL/min   Comment: (NOTE)     The eGFR has been calculated using the CKD EPI equation.     This calculation has not been validated in all clinical situations.     eGFR's persistently <90 mL/min signify possible Chronic Kidney     Disease.  AMMONIA     Status: Abnormal   Collection Time    04/17/13  5:19 PM      Result Value Range   Ammonia 84 (*) 11 - 60 umol/L  GLUCOSE, CAPILLARY     Status: None   Collection Time    04/17/13 10:52 PM      Result Value Range   Glucose-Capillary 86  70 - 99 mg/dL  COMPREHENSIVE METABOLIC PANEL     Status: Abnormal   Collection Time    04/18/13  5:18 AM      Result Value Range   Sodium 143  135 - 145 mEq/L   Potassium 4.0  3.5 - 5.1 mEq/L   Chloride 109  96 - 112 mEq/L   CO2 28  19 - 32 mEq/L   Glucose, Bld 85  70 - 99 mg/dL   BUN 11  6 - 23 mg/dL   Creatinine, Ser 2.13  0.50 - 1.35 mg/dL   Calcium 8.9  8.4 - 08.6 mg/dL   Total Protein 6.2  6.0 - 8.3 g/dL   Albumin 2.5 (*) 3.5 - 5.2 g/dL   AST 30  0 - 37 U/L   ALT 11  0 - 53 U/L   Alkaline Phosphatase 124 (*) 39 - 117 U/L   Total Bilirubin 1.8 (*) 0.3 - 1.2 mg/dL   GFR calc non Af Amer >90  >90 mL/min   GFR calc Af Amer >90  >90 mL/min   Comment: (NOTE)     The eGFR has been calculated using the CKD EPI equation.     This calculation has not been validated in all clinical situations.     eGFR's persistently <90 mL/min signify possible Chronic Kidney     Disease.  CBC     Status: Abnormal   Collection Time    04/18/13  5:18 AM      Result Value Range   WBC 2.9 (*) 4.0 - 10.5 K/uL   RBC 3.57 (*) 4.22 - 5.81 MIL/uL   Hemoglobin 9.4 (*) 13.0 - 17.0 g/dL   HCT 57.8 (*) 46.9 - 62.9 %   MCV 85.4  78.0 - 100.0 fL   MCH 26.3  26.0 - 34.0 pg   MCHC 30.8  30.0 - 36.0 g/dL   RDW 52.8 (*) 41.3 - 24.4 %   Platelets 42 (*) 150 - 400 K/uL   Comment: SPECIMEN CHECKED FOR CLOTS     PLATELET COUNT CONFIRMED BY SMEAR  PROTIME-INR     Status: Abnormal   Collection Time    04/18/13  5:18 AM       Result Value Range   Prothrombin Time 21.0 (*) 11.6 - 15.2 seconds   INR 1.87 (*) 0.00 - 1.49  HEMOGLOBIN A1C     Status: None   Collection Time    04/18/13  5:18 AM      Result Value Range   Hemoglobin A1C 5.1  <5.7 %   Comment: (NOTE)                                                                               According to the ADA Clinical Practice Recommendations for 2011, when     HbA1c is used as a screening test:      >=6.5%   Diagnostic of Diabetes Mellitus               (if abnormal result is confirmed)     5.7-6.4%   Increased risk of developing Diabetes Mellitus     References:Diagnosis and Classification of Diabetes Mellitus,Diabetes     Care,2011,34(Suppl 1):S62-S69 and Standards of Medical Care in             Diabetes - 2011,Diabetes Care,2011,34 (Suppl 1):S11-S61.   Mean Plasma Glucose 100  <117 mg/dL   Comment: Performed at [facility]  URINALYSIS, ROUTINE W REFLEX MICROSCOPIC     Status: None   Collection Time    04/18/13  7:02 AM      Result Value Range   Color, Urine YELLOW  YELLOW   APPearance CLEAR  CLEAR   Specific Gravity, Urine 1.025  1.005 - 1.030   pH 6.5  5.0 - 8.0   Glucose, UA NEGATIVE  NEGATIVE mg/dL   Hgb urine dipstick NEGATIVE  NEGATIVE   Bilirubin Urine NEGATIVE  NEGATIVE   Ketones, ur NEGATIVE  NEGATIVE mg/dL   Protein, ur NEGATIVE  NEGATIVE mg/dL   Urobilinogen, UA 0.2  0.0 - 1.0 mg/dL   Nitrite NEGATIVE  NEGATIVE   Leukocytes, UA NEGATIVE  NEGATIVE   Comment: MICROSCOPIC NOT DONE ON URINES WITH NEGATIVE PROTEIN, BLOOD, LEUKOCYTES, NITRITE, OR GLUCOSE <1000 mg/dL.  GLUCOSE, CAPILLARY     Status: None   Collection Time    04/18/13  7:31 AM      Result Value Range   Glucose-Capillary 95  70 - 99 mg/dL   Comment 1 Notify RN    GLUCOSE, CAPILLARY     Status: Abnormal   Collection Time    04/18/13 11:16 AM      Result Value Range   Glucose-Capillary 136 (*) 70 - 99 mg/dL   Comment 1 Notify RN    GLUCOSE, CAPILLARY      Status: None   Collection Time    04/18/13  4:45 PM      Result Value Range   Glucose-Capillary 85  70 - 99 mg/dL   Comment 1 Notify RN     Comment 2 Documented in Chart    GLUCOSE, CAPILLARY     Status: Abnormal   Collection Time    04/18/13  9:55 PM      Result Value Range  Glucose-Capillary 107 (*) 70 - 99 mg/dL   Comment 1 Notify RN     Comment 2 Documented in Chart    CBC     Status: Abnormal   Collection Time    04/19/13  5:34 AM      Result Value Range   WBC 2.6 (*) 4.0 - 10.5 K/uL   RBC 3.59 (*) 4.22 - 5.81 MIL/uL   Hemoglobin 9.4 (*) 13.0 - 17.0 g/dL   HCT 40.9 (*) 81.1 - 91.4 %   MCV 85.8  78.0 - 100.0 fL   MCH 26.2  26.0 - 34.0 pg   MCHC 30.5  30.0 - 36.0 g/dL   RDW 78.2 (*) 95.6 - 21.3 %   Platelets 45 (*) 150 - 400 K/uL   Comment: SPECIMEN CHECKED FOR CLOTS     PLATELET COUNT CONFIRMED BY SMEAR  BASIC METABOLIC PANEL     Status: Abnormal   Collection Time    04/19/13  5:34 AM      Result Value Range   Sodium 140  135 - 145 mEq/L   Potassium 3.6  3.5 - 5.1 mEq/L   Chloride 106  96 - 112 mEq/L   CO2 27  19 - 32 mEq/L   Glucose, Bld 144 (*) 70 - 99 mg/dL   BUN 11  6 - 23 mg/dL   Creatinine, Ser 0.86  0.50 - 1.35 mg/dL   Calcium 9.0  8.4 - 57.8 mg/dL   GFR calc non Af Amer >90  >90 mL/min   GFR calc Af Amer >90  >90 mL/min   Comment: (NOTE)     The eGFR has been calculated using the CKD EPI equation.     This calculation has not been validated in all clinical situations.     eGFR's persistently <90 mL/min signify possible Chronic Kidney     Disease.  PROTIME-INR     Status: Abnormal   Collection Time    04/19/13  5:34 AM      Result Value Range   Prothrombin Time 20.4 (*) 11.6 - 15.2 seconds   INR 1.80 (*) 0.00 - 1.49  GLUCOSE, CAPILLARY     Status: None   Collection Time    04/19/13  7:26 AM      Result Value Range   Glucose-Capillary 88  70 - 99 mg/dL   Comment 1 Notify RN      Dg Chest 1 View  04/17/2013   *RADIOLOGY REPORT*  Clinical Data:  Altered mental status, weakness, history of COPD and smoking  CHEST - 1 VIEW  Comparison: 09/17/2012; 01/28/2011; 10/05/2010  Findings: Grossly unchanged borderline enlarged cardiac silhouette and mediastinal contours.  There is grossly unchanged mild diffuse slightly nodular thickening of the interstitium.  No focal airspace opacity.  No pleural effusion or pneumothorax.  The evidence of eight.  Grossly unchanged bones.  IMPRESSION: Mild bronchitic change without definite acute cardiopulmonary disease.  Further evaluation with a PA and lateral chest radiograph may be obtained as clinically indicated.   Original Report Authenticated By: Tacey Ruiz, [name] [name]   Ct Head Wo Contrast  04/17/2013   *RADIOLOGY REPORT*  Clinical Data: Altered mental status  CT HEAD WITHOUT CONTRAST  Technique:  Contiguous axial images were obtained from the base of the skull through the vertex without contrast.  Comparison: 01/31/2013  Findings: Ventricle size is normal.  Negative for acute or chronic infarction.  Negative for hemorrhage mass or edema.  Chronic calcification of the right globe is  unchanged.  No acute bony abnormality. Prior mastoidectomy bilaterally.  IMPRESSION: No acute intracranial abnormality.   Original Report Authenticated By: Janeece Riggers, M.D.        Lenna Hagarty A. Gerilyn [name], M.D.  Diplomate, Biomedical engineer of Psychiatry and Neurology ( Neurology). 04/19/2013, 9:50 AM

## 2013-04-19 NOTE — Progress Notes (Signed)
Discharge instructions given to wife, verbalized understanding, out in stable condition via w/c with staff.

## 2013-04-20 NOTE — Progress Notes (Signed)
UR chart review completed.  

## 2013-05-16 ENCOUNTER — Emergency Department (HOSPITAL_COMMUNITY): Payer: Medicare Other

## 2013-05-16 ENCOUNTER — Inpatient Hospital Stay (HOSPITAL_COMMUNITY)
Admission: EM | Admit: 2013-05-16 | Discharge: 2013-05-18 | DRG: 442 | Disposition: A | Discharge status: Home / Self Care | Payer: Medicare Other | Attending: Internal Medicine | Admitting: Internal Medicine

## 2013-05-16 ENCOUNTER — Encounter (HOSPITAL_COMMUNITY): Payer: Self-pay

## 2013-05-16 DIAGNOSIS — E876 Hypokalemia: Secondary | ICD-10-CM

## 2013-05-16 DIAGNOSIS — M129 Arthropathy, unspecified: Secondary | ICD-10-CM | POA: Diagnosis present

## 2013-05-16 DIAGNOSIS — J4489 Other specified chronic obstructive pulmonary disease: Secondary | ICD-10-CM | POA: Diagnosis present

## 2013-05-16 DIAGNOSIS — D61818 Other pancytopenia: Secondary | ICD-10-CM | POA: Diagnosis present

## 2013-05-16 DIAGNOSIS — G40909 Epilepsy, unspecified, not intractable, without status epilepticus: Secondary | ICD-10-CM | POA: Diagnosis present

## 2013-05-16 DIAGNOSIS — H5440 Blindness, one eye, unspecified eye: Secondary | ICD-10-CM | POA: Diagnosis present

## 2013-05-16 DIAGNOSIS — K920 Hematemesis: Secondary | ICD-10-CM

## 2013-05-16 DIAGNOSIS — F319 Bipolar disorder, unspecified: Secondary | ICD-10-CM | POA: Diagnosis present

## 2013-05-16 DIAGNOSIS — Z79899 Other long term (current) drug therapy: Secondary | ICD-10-CM

## 2013-05-16 DIAGNOSIS — K7290 Hepatic failure, unspecified without coma: Principal | ICD-10-CM

## 2013-05-16 DIAGNOSIS — D696 Thrombocytopenia, unspecified: Secondary | ICD-10-CM

## 2013-05-16 DIAGNOSIS — K7689 Other specified diseases of liver: Secondary | ICD-10-CM | POA: Diagnosis present

## 2013-05-16 DIAGNOSIS — E119 Type 2 diabetes mellitus without complications: Secondary | ICD-10-CM | POA: Diagnosis present

## 2013-05-16 DIAGNOSIS — F411 Generalized anxiety disorder: Secondary | ICD-10-CM | POA: Diagnosis present

## 2013-05-16 DIAGNOSIS — Z66 Do not resuscitate: Secondary | ICD-10-CM | POA: Diagnosis present

## 2013-05-16 DIAGNOSIS — D649 Anemia, unspecified: Secondary | ICD-10-CM

## 2013-05-16 DIAGNOSIS — K7682 Hepatic encephalopathy: Principal | ICD-10-CM | POA: Diagnosis present

## 2013-05-16 DIAGNOSIS — D684 Acquired coagulation factor deficiency: Secondary | ICD-10-CM | POA: Diagnosis present

## 2013-05-16 DIAGNOSIS — I10 Essential (primary) hypertension: Secondary | ICD-10-CM | POA: Diagnosis present

## 2013-05-16 DIAGNOSIS — Z720 Tobacco use: Secondary | ICD-10-CM

## 2013-05-16 DIAGNOSIS — E7220 Disorder of urea cycle metabolism, unspecified: Secondary | ICD-10-CM | POA: Diagnosis present

## 2013-05-16 DIAGNOSIS — F17200 Nicotine dependence, unspecified, uncomplicated: Secondary | ICD-10-CM | POA: Diagnosis present

## 2013-05-16 DIAGNOSIS — E785 Hyperlipidemia, unspecified: Secondary | ICD-10-CM | POA: Diagnosis present

## 2013-05-16 DIAGNOSIS — J449 Chronic obstructive pulmonary disease, unspecified: Secondary | ICD-10-CM

## 2013-05-16 DIAGNOSIS — F329 Major depressive disorder, single episode, unspecified: Secondary | ICD-10-CM

## 2013-05-16 DIAGNOSIS — R600 Localized edema: Secondary | ICD-10-CM

## 2013-05-16 DIAGNOSIS — F79 Unspecified intellectual disabilities: Secondary | ICD-10-CM | POA: Diagnosis present

## 2013-05-16 DIAGNOSIS — D689 Coagulation defect, unspecified: Secondary | ICD-10-CM | POA: Diagnosis present

## 2013-05-16 DIAGNOSIS — K219 Gastro-esophageal reflux disease without esophagitis: Secondary | ICD-10-CM

## 2013-05-16 DIAGNOSIS — K7460 Unspecified cirrhosis of liver: Secondary | ICD-10-CM | POA: Diagnosis present

## 2013-05-16 DIAGNOSIS — F32A Depression, unspecified: Secondary | ICD-10-CM

## 2013-05-16 DIAGNOSIS — R32 Unspecified urinary incontinence: Secondary | ICD-10-CM

## 2013-05-16 LAB — AMMONIA: Ammonia: 153 umol/L — ABNORMAL HIGH (ref 11–60)

## 2013-05-16 LAB — CBC WITH DIFFERENTIAL/PLATELET
Basophils Absolute: 0 10*3/uL (ref 0.0–0.1)
Eosinophils Relative: 4 % (ref 0–5)
HCT: 32.4 % — ABNORMAL LOW (ref 39.0–52.0)
Lymphs Abs: 0.3 10*3/uL — ABNORMAL LOW (ref 0.7–4.0)
MCH: 26.1 pg (ref 26.0–34.0)
MCHC: 30.6 g/dL (ref 30.0–36.0)
MCV: 85.5 fL (ref 78.0–100.0)
Monocytes Absolute: 0.2 10*3/uL (ref 0.1–1.0)
Monocytes Relative: 7 % (ref 3–12)
Neutro Abs: 2.3 10*3/uL (ref 1.7–7.7)
Neutrophils Relative %: 76 % (ref 43–77)
RDW: 20.9 % — ABNORMAL HIGH (ref 11.5–15.5)
WBC: 2.9 10*3/uL — ABNORMAL LOW (ref 4.0–10.5)

## 2013-05-16 NOTE — ED Notes (Signed)
Pt in by ems from home, reports generalized weakness for a week, also c/o frequent urination

## 2013-05-16 NOTE — ED Provider Notes (Signed)
CSN: [id number]     Arrival date & time 05/16/13  2310 History   This chart was scribed for [name] Crease, MD,  by Allene Dillon, ED Scribe. This patient was seen in room APA02/APA02 and the patient's care was started at 11:14 PM.    Chief Complaint  Patient presents with  . Weakness  . Urinary Frequency    The history is provided by the EMS personnel. The history is limited by the condition of the patient. No language interpreter was used.    LEVEL 5 CAVEAT (Non-communicative, Mental Retardation)  HPI Comments: [name] [name] is a 52 y.o. male with a history of MR and cirrhosis who was brought in by EMS to the Emergency Department from his home. Per EMS, pt has been complaining of generalized weakness, urinary frequency and pain for the past week. Pt can only describe his generalized pain as "hurting all over". He is hard of hearing and overall non-communicative. When asked about his breathing, he states "pretty bad".    Past Medical History  Diagnosis Date  . Depression   . 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 Bipolar 1 disorder   . Hypertension   . Diabetes mellitus   . Seizures   . COPD (chronic obstructive pulmonary disease)   . Mental retardation   . Blind right eye   . GERD (gastroesophageal reflux disease)   . Chronic back pain   . Hepatic encephalopathy   . Anemia   . Thrombocytopenia   . Cardiomegaly   . Anxiety   . Arthritis   . Cholelithiasis     Asymptomatic  . Cerebrovascular disease     Right vertebral artery  . Hard of hearing   . Hyperlipidemia   . Cataract     Right eye  . Blood clots in brain     per patient  . Cirrhosis     diagnosed 07/2010 when presented with first episode of hepatic encephalopathy, afp on 416/13=4.8,  U/S on 12/14/11  liver stable, pt has received 2 hep A/B vaccines  . Diverticulosis    Past Surgical History  Procedure Laterality Date  . Tonsillectomy    . Ear operations    . Cast application  08/30/2011     Procedure: MINOR CAST APPLICATION;  Surgeon: Fuller Canada, MD;  Location: [facility];  Service: Orthopedics;  Laterality: Right;  no anesthesia , procedure room do not need or room !  . Orif ankle fracture  08/27/2011    Procedure: OPEN REDUCTION INTERNAL FIXATION (ORIF) ANKLE FRACTURE;  Surgeon: Fuller Canada, MD;  Location: [facility];  Service: Orthopedics;  Laterality: Right;  . Esophagogastroduodenoscopy  02/02/2011    Rourk-Normal esophagus.  No varices/Question mild portal gastropathy, extrinsic compression on the antrum, lesser curvature of uncertain significance, some minimally  nodular mucosa status post biopsy, patent pylorus, normal duodenum 1 and duodenum 2.  . Colonoscopy  12/15/11    colonic divericulosis;poor prep, ACBE  recommended but has not been done  . Esophagogastroduodenoscopy  12/15/11    Rourk-->mild changes of portal gastropathy, gastric/duodenal erosions s/p bx  (reactive changes with mild chronic inflammation. No H.pylori  . Foot surgery     Family History  Problem Relation Age of Onset  . Heart failure Mother   . Thyroid disease Mother   . Cancer Father   . Asthma Brother   . Heart attack Brother   . Cirrhosis Brother     EtOH  . Colon cancer Maternal Aunt  History  Substance Use Topics  . Smoking status: Current Every Day Smoker -- 1.00 packs/day for 30 years    Types: Cigarettes  . Smokeless tobacco: Not on file     Comment: only smokes about 1-2 a day  . Alcohol Use: No    Review of Systems  Unable to perform ROS: Patient nonverbal    Allergies  Penicillins  Home Medications   Current Outpatient Rx  Name  Route  Sig  Dispense  Refill  . ALPRAZolam (XANAX) 0.5 MG tablet   Oral   Take 0.5 mg by mouth 2 (two) times daily.         Marland Kitchen aspirin EC 81 MG tablet   Oral   Take 81 mg by mouth daily.         . Cholecalciferol (VITAMIN D) 2000 UNITS tablet   Oral   Take 1 tablet (2,000 Units total) by mouth daily.   30 tablet   0   . fish  oil-omega-3 fatty acids 1000 MG capsule   Oral   Take 1 g by mouth 3 (three) times daily.         Marland Kitchen FLUoxetine (PROZAC) 20 MG capsule   Oral   Take 1 capsule (20 mg total) by mouth daily.   30 capsule   0   . FLUoxetine (PROZAC) 20 MG capsule   Oral   Take 20 mg by mouth daily.         Marland Kitchen HYDROcodone-acetaminophen (NORCO/VICODIN) 5-325 MG per tablet   Oral   Take 1 tablet by mouth 3 (three) times daily.         Marland Kitchen lactulose (CHRONULAC) 10 GM/15ML solution   Oral   Take 60 mLs (40 g total) by mouth 3 (three) times daily. Titrate for 3-4 bowel motions daily.   240 mL   0   . levETIRAcetam (KEPPRA) 500 MG tablet   Oral   Take 1.5 tablets (750 mg total) by mouth 2 (two) times daily.   60 tablet   0   . metoprolol tartrate (LOPRESSOR) 25 MG tablet   Oral   Take 25-50 mg by mouth 2 (two) times daily. Take 25 mg in the morning and 50 mg at night         . Multiple Vitamin (MULTIVITAMIN WITH MINERALS) TABS   Oral   Take 1 tablet by mouth daily.         Marland Kitchen omeprazole (PRILOSEC) 20 MG capsule   Oral   Take 1 capsule (20 mg total) by mouth daily.   30 capsule   0   . potassium chloride SA (K-DUR,KLOR-CON) 20 MEQ tablet   Oral   Take 1 tablet (20 mEq total) by mouth daily.   30 tablet   0    Triage Vitals: BP 150/70  Pulse 59  Temp(Src) 98.5 F (36.9 C) (Oral)  Resp 14  SpO2 100%  Physical Exam  Nursing note and vitals reviewed. Constitutional: He appears well-developed and well-nourished. No distress.  HENT:  Head: Normocephalic and atraumatic.  Right Ear: Hearing normal.  Left Ear: Hearing normal.  Nose: Nose normal.  Mouth/Throat: Oropharynx is clear and moist and mucous membranes are normal.  Eyes: Conjunctivae and EOM are normal. Pupils are equal, round, and reactive to light.  Neck: Normal range of motion. Neck supple.  Cardiovascular: Regular rhythm, S1 normal and S2 normal.  Exam reveals no gallop and no friction rub.   No murmur  heard. Pulmonary/Chest: Effort normal and  breath sounds normal. No respiratory distress. He exhibits no tenderness.  Abdominal: Soft. Normal appearance and bowel sounds are normal. There is no hepatosplenomegaly. There is no tenderness. There is no rebound, no guarding, no tenderness at McBurney's point and negative Murphy's sign. No hernia.  Musculoskeletal: Normal range of motion. He exhibits edema.  1-2+ pitting edema in low extremities.  Neurological: He is alert. He has normal strength. No cranial nerve deficit or sensory deficit. Coordination normal. GCS eye subscore is 4. GCS verbal subscore is 5. GCS motor subscore is 6.  Non-communicative.  Skin: Skin is warm, dry and intact. No rash noted. No cyanosis.  Psychiatric: He has a normal mood and affect. His speech is normal and behavior is normal. Thought content normal.    ED Course  Procedures (including critical care time)  EKG:  Date: 05/17/2013  Rate: 60  Rhythm: normal sinus rhythm  QRS Axis: normal  Intervals: normal  ST/T Wave abnormalities: normal  Conduction Disutrbances:none  Narrative Interpretation:   Old EKG Reviewed: unchanged    DIAGNOSTIC STUDIES: Oxygen Saturation is 100% on RA, normal by my interpretation.    COORDINATION OF CARE: 11:24 PM- Pt is non-communicative. Will provide EKG, labs, chest x-ray for pt.     Labs Review Labs Reviewed  CBC WITH DIFFERENTIAL - Abnormal; Notable for the following:    WBC 2.9 (*)    RBC 3.79 (*)    Hemoglobin 9.9 (*)    HCT 32.4 (*)    RDW 20.9 (*)    Platelets 57 (*)    Lymphs Abs 0.3 (*)    All other components within normal limits  COMPREHENSIVE METABOLIC PANEL - Abnormal; Notable for the following:    Glucose, Bld 106 (*)    Albumin 3.0 (*)    Alkaline Phosphatase 146 (*)    Total Bilirubin 1.6 (*)    All other components within normal limits  PROTIME-INR - Abnormal; Notable for the following:    Prothrombin Time 18.5 (*)    INR 1.59 (*)    All other  components within normal limits  AMMONIA - Abnormal; Notable for the following:    Ammonia 153 (*)    All other components within normal limits  PRO B NATRIURETIC PEPTIDE  TROPONIN I  LIPASE, BLOOD  URINALYSIS, ROUTINE W REFLEX MICROSCOPIC   Imaging Review Dg Chest Port 1 View  05/16/2013   CLINICAL DATA:  Generalized weakness.  Increased urinary frequency.  EXAM: PORTABLE CHEST - 1 VIEW  COMPARISON:  Single view of the chest 04/17/2013.  FINDINGS: Heart size is upper normal. Lungs are clear. No pneumothorax or pleural fluid.  IMPRESSION: No acute disease.   Electronically Signed   By: Drusilla Kanner M.D.   On: 05/16/2013 23:46    MDM  Diagnosis: Hepatic Encephalopathy  Patient brought to the ER from home by EMS. Patient has reportedly been experiencing progressively worsening generalized weakness over the period of one week. Patient had been complaining of urinary frequency earlier. Arrival to the ER, patient states that he hurts "all over, but examination does not reveal any swelling, skin changes, etc. No specific point tenderness.  Workup reveals pancytopenia consistent with previous. He does, however, have markedly elevated ammonia at 153. This explains the patient's mental status changes. He will require hospitalization for further management.   I personally performed the services described in this documentation, which was scribed in my presence. The recorded information has been reviewed and is accurate.   [name] Crease, MD  05/17/13 0023 

## 2013-05-17 DIAGNOSIS — E7220 Disorder of urea cycle metabolism, unspecified: Secondary | ICD-10-CM

## 2013-05-17 DIAGNOSIS — D689 Coagulation defect, unspecified: Secondary | ICD-10-CM

## 2013-05-17 DIAGNOSIS — I10 Essential (primary) hypertension: Secondary | ICD-10-CM

## 2013-05-17 DIAGNOSIS — E119 Type 2 diabetes mellitus without complications: Secondary | ICD-10-CM

## 2013-05-17 DIAGNOSIS — K7460 Unspecified cirrhosis of liver: Secondary | ICD-10-CM

## 2013-05-17 DIAGNOSIS — F329 Major depressive disorder, single episode, unspecified: Secondary | ICD-10-CM

## 2013-05-17 DIAGNOSIS — K7290 Hepatic failure, unspecified without coma: Principal | ICD-10-CM

## 2013-05-17 LAB — PRO B NATRIURETIC PEPTIDE: Pro B Natriuretic peptide (BNP): 34.3 pg/mL (ref 0–125)

## 2013-05-17 LAB — AMMONIA: Ammonia: 71 umol/L — ABNORMAL HIGH (ref 11–60)

## 2013-05-17 LAB — URINALYSIS, ROUTINE W REFLEX MICROSCOPIC
Bilirubin Urine: NEGATIVE
Glucose, UA: NEGATIVE mg/dL
Ketones, ur: NEGATIVE mg/dL
Leukocytes, UA: NEGATIVE
Protein, ur: NEGATIVE mg/dL
pH: 8.5 — ABNORMAL HIGH (ref 5.0–8.0)

## 2013-05-17 LAB — COMPREHENSIVE METABOLIC PANEL
Albumin: 2.5 g/dL — ABNORMAL LOW (ref 3.5–5.2)
Alkaline Phosphatase: 118 U/L — ABNORMAL HIGH (ref 39–117)
BUN: 14 mg/dL (ref 6–23)
BUN: 13 mg/dL (ref 6–23)
CO2: 30 mEq/L (ref 19–32)
CO2: 26 mEq/L (ref 19–32)
Calcium: 9.1 mg/dL (ref 8.4–10.5)
Calcium: 8.6 mg/dL (ref 8.4–10.5)
Chloride: 104 mEq/L (ref 96–112)
Chloride: 108 mEq/L (ref 96–112)
Creatinine, Ser: 0.81 mg/dL (ref 0.50–1.35)
GFR calc non Af Amer: >90 mL/min (ref >90)
Glucose, Bld: 85 mg/dL (ref 70–99)
Potassium: 3.7 mEq/L (ref 3.5–5.1)
Total Bilirubin: 1.6 mg/dL — ABNORMAL HIGH (ref 0.3–1.2)
Total Bilirubin: 1.7 mg/dL — ABNORMAL HIGH (ref 0.3–1.2)

## 2013-05-17 LAB — CBC
HCT: 27.8 % — ABNORMAL LOW (ref 39.0–52.0)
Hemoglobin: 8.6 g/dL — ABNORMAL LOW (ref 13.0–17.0)
RBC: 3.27 MIL/uL — ABNORMAL LOW (ref 4.22–5.81)
WBC: 2.6 10*3/uL — ABNORMAL LOW (ref 4.0–10.5)

## 2013-05-17 LAB — PROTIME-INR: Prothrombin Time: 20.4 seconds — ABNORMAL HIGH (ref 11.6–15.2)

## 2013-05-17 LAB — GLUCOSE, CAPILLARY: Glucose-Capillary: 86 mg/dL (ref 70–99)

## 2013-05-17 LAB — URINE MICROSCOPIC-ADD ON

## 2013-05-17 LAB — TROPONIN I
Troponin I: <0.3 ng/mL (ref <0.30)
Troponin I: <0.3 ng/mL (ref <0.30)

## 2013-05-17 MED ORDER — FLUOXETINE HCL 20 MG PO CAPS
20.0000 mg | ORAL_CAPSULE | Freq: Every day | ORAL | Status: DC
  Administered 2013-05-17 – 2013-05-18 (×2): 20 mg via ORAL
  Filled 2013-05-17 (×2): qty 1

## 2013-05-17 MED ORDER — LACTULOSE 10 GM/15ML PO SOLN
30.0000 g | Freq: Once | ORAL | Status: AC
  Administered 2013-05-17: 30 g via ORAL
  Filled 2013-05-17: qty 60

## 2013-05-17 MED ORDER — CAPSAICIN 0.025 % EX CREA
TOPICAL_CREAM | Freq: Two times a day (BID) | CUTANEOUS | Status: DC
  Administered 2013-05-17 – 2013-05-18 (×3): via TOPICAL
  Filled 2013-05-17: qty 56.6

## 2013-05-17 MED ORDER — INSULIN ASPART 100 UNIT/ML ~~LOC~~ SOLN: 0.0000 [IU] | Freq: Three times a day (TID) | SUBCUTANEOUS | Status: DC

## 2013-05-17 MED ORDER — SODIUM CHLORIDE 0.9 % IV SOLN: 250.0000 mL | INTRAVENOUS | Status: DC | PRN

## 2013-05-17 MED ORDER — ASPIRIN EC 81 MG PO TBEC
81.0000 mg | DELAYED_RELEASE_TABLET | Freq: Every day | ORAL | Status: DC
  Administered 2013-05-17 – 2013-05-18 (×2): 81 mg via ORAL
  Filled 2013-05-17 (×2): qty 1

## 2013-05-17 MED ORDER — SODIUM CHLORIDE 0.9 % IJ SOLN: 3.0000 mL | INTRAMUSCULAR | Status: DC | PRN

## 2013-05-17 MED ORDER — METOPROLOL TARTRATE 50 MG PO TABS
50.0000 mg | ORAL_TABLET | Freq: Every day | ORAL | Status: DC
  Administered 2013-05-17 (×2): 50 mg via ORAL
  Filled 2013-05-17 (×2): qty 1

## 2013-05-17 MED ORDER — ONDANSETRON HCL 4 MG PO TABS: 4.0000 mg | ORAL_TABLET | Freq: Four times a day (QID) | ORAL | Status: DC | PRN

## 2013-05-17 MED ORDER — LACTULOSE 10 GM/15ML PO SOLN
40.0000 g | Freq: Three times a day (TID) | ORAL | Status: DC
  Administered 2013-05-17 (×3): 40 g via ORAL
  Filled 2013-05-17 (×4): qty 60

## 2013-05-17 MED ORDER — LEVETIRACETAM 500 MG PO TABS
750.0000 mg | ORAL_TABLET | Freq: Two times a day (BID) | ORAL | Status: DC
  Administered 2013-05-17 – 2013-05-18 (×4): 750 mg via ORAL
  Filled 2013-05-17 (×4): qty 2

## 2013-05-17 MED ORDER — ONDANSETRON HCL 4 MG/2ML IJ SOLN: 4.0000 mg | Freq: Four times a day (QID) | INTRAMUSCULAR | Status: DC | PRN

## 2013-05-17 MED ORDER — METOPROLOL TARTRATE 25 MG PO TABS
25.0000 mg | ORAL_TABLET | Freq: Every day | ORAL | Status: DC
  Administered 2013-05-17 – 2013-05-18 (×2): 25 mg via ORAL
  Filled 2013-05-17 (×2): qty 1

## 2013-05-17 MED ORDER — LACTULOSE ENEMA
300.0000 mL | Freq: Once | ORAL | Status: AC | PRN
  Filled 2013-05-17: qty 300

## 2013-05-17 MED ORDER — SODIUM CHLORIDE 0.9 % IJ SOLN
3.0000 mL | Freq: Two times a day (BID) | INTRAMUSCULAR | Status: DC
  Administered 2013-05-17 (×3): 3 mL via INTRAVENOUS

## 2013-05-17 NOTE — Care Management Note (Addendum)
    Page 1 of 1   05/18/2013     11:33:49 AM   CARE MANAGEMENT NOTE 05/18/2013  Patient:  KEELAN, TRIPODI   Account Number:  [id number]  Date Initiated:  05/17/2013  Documentation initiated by:  Sharrie Rothman  Subjective/Objective Assessment:   Pt admitted from home with hepatic encephalopathy. Pt lives with his wife and requires min assist with ADL's. Pt has a cane for home use.     Action/Plan:   Pts wife denies any need for [facility] at discharge.   Anticipated DC Date:  05/19/2013   Anticipated DC Plan:  HOME/SELF CARE      DC Planning Services  CM consult      Choice offered to / List presented to:             Status of service:  Completed, signed off Medicare Important Message given?  YES (If response is "NO", the following Medicare IM given date fields will be blank) Date Medicare IM given:  05/18/2013 Date Additional Medicare IM given:    Discharge Disposition:  HOME/SELF CARE  Per UR Regulation:    If discussed at Long Length of Stay Meetings, dates discussed:    Comments:  05/18/13 1132 Arlyss Queen, RN BSN CM Pt discharged home today. No CM needs noted.  05/17/13 1445 Arlyss Queen, RN BSN CM

## 2013-05-17 NOTE — Progress Notes (Signed)
UR chart review completed.  

## 2013-05-17 NOTE — H&P (Signed)
PCP:   DANIEL,JANICE, NP   Chief Complaint:  Altered mental status  HPI: 52 year old male with a history of liver cirrhosis secondary to [name] who was brought to the ED from home due to altered mental status. Patient's only complaint in the ED was dysuria, and is unable to provide any meaningful history due to confusion. Called and discussed with patient's wife who says that patient has been confused at home. He did not have nausea vomiting or diarrhea but he did complain of chest pain this morning when he was getting  flu shot.  In the ED he was found to have elevated levels of ammonia. 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 Patient was recently discharged from the hospital after he was admitted for seizure, at that time his Keppra was increased to  750 mg twice a day.  Allergies:   Allergies  Allergen Reactions  . Penicillins Rash      Past Medical History  Diagnosis Date  . Depression   . 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 Bipolar 1 disorder   . Hypertension   . Diabetes mellitus   . Seizures   . COPD (chronic obstructive pulmonary disease)   . Mental retardation   . Blind right eye   . GERD (gastroesophageal reflux disease)   . Chronic back pain   . Hepatic encephalopathy   . Anemia   . Thrombocytopenia   . Cardiomegaly   . Anxiety   . Arthritis   . Cholelithiasis     Asymptomatic  . Cerebrovascular disease     Right vertebral artery  . Hard of hearing   . Hyperlipidemia   . Cataract     Right eye  . Blood clots in brain     per patient  . Cirrhosis     diagnosed 07/2010 when presented with first episode of hepatic encephalopathy, afp on 416/13=4.8,  U/S on 12/14/11  liver stable, pt has received 2 hep A/B vaccines  . Diverticulosis     Past Surgical History  Procedure Laterality Date  . Tonsillectomy    . Ear operations    . Cast application  08/28/1094    Procedure: MINOR CAST APPLICATION;  Surgeon:  [name], MD;  Location: [facility];  Service: Orthopedics;  Laterality: Right;  no anesthesia , procedure room do not need or room !  . Orif ankle fracture  08/27/2011    Procedure: OPEN REDUCTION INTERNAL FIXATION (ORIF) ANKLE FRACTURE;  Surgeon: [name], MD;  Location: [facility];  Service: Orthopedics;  Laterality: Right;  . Esophagogastroduodenoscopy  02/02/2011    Rourk-Normal esophagus.  No varices/Question mild portal gastropathy, extrinsic compression on the antrum, lesser curvature of uncertain significance, some minimally  nodular mucosa status post biopsy, patent pylorus, normal duodenum 1 and duodenum 2.  . Colonoscopy  12/15/11    colonic divericulosis;poor prep, ACBE  recommended but has not been done  . Esophagogastroduodenoscopy  12/15/11    Rourk-->mild changes of portal gastropathy, gastric/duodenal erosions s/p bx  (reactive changes with mild chronic inflammation. No H.pylori  . Foot surgery      Prior to Admission medications   Medication Sig Start Date End Date Taking? Authorizing Provider  ALPRAZolam Duanne Moron) 0.5 MG tablet Take 0.5 mg by mouth 2 (two) times daily.   Yes Historical Provider, MD  FLUoxetine (PROZAC) 20 MG capsule Take 1 capsule (20 mg total) by mouth daily. 09/20/12  Yes Nimish C  Anastasio Champion, MD  furosemide (LASIX) 20 MG tablet Take 20 mg by mouth daily.   Yes Historical Provider, MD  glimepiride (AMARYL) 2 MG tablet Take 2 mg by mouth daily before breakfast.   Yes Historical Provider, MD  levETIRAcetam (KEPPRA) 500 MG tablet Take 1.5 tablets (750 mg total) by mouth 2 (two) times daily. 04/19/13  Yes Marzetta Board, MD  omeprazole (PRILOSEC) 20 MG capsule Take 1 capsule (20 mg total) by mouth daily. 09/20/12  Yes Nimish Luther Parody, MD  potassium chloride SA (K-DUR,KLOR-CON) 20 MEQ tablet Take 1 tablet (20 mEq total) by mouth daily. 09/20/12  Yes Nimish Luther Parody, MD  aspirin EC 81 MG tablet Take 81 mg by mouth daily.    Historical Provider, MD  Cholecalciferol  (VITAMIN D) 2000 UNITS tablet Take 1 tablet (2,000 Units total) by mouth daily. 09/20/12   Nimish Luther Parody, MD  fish oil-omega-3 fatty acids 1000 MG capsule Take 1 g by mouth 3 (three) times daily.    Historical Provider, MD  FLUoxetine (PROZAC) 20 MG capsule Take 20 mg by mouth daily.    Historical Provider, MD  HYDROcodone-acetaminophen (NORCO/VICODIN) 5-325 MG per tablet Take 1 tablet by mouth 3 (three) times daily.    Historical Provider, MD  lactulose (CHRONULAC) 10 GM/15ML solution Take 60 mLs (40 g total) by mouth 3 (three) times daily. Titrate for 3-4 bowel motions daily. 02/02/13   Nimish Luther Parody, MD  metoprolol tartrate (LOPRESSOR) 25 MG tablet Take 25-50 mg by mouth 2 (two) times daily. Take 25 mg in the morning and 50 mg at night    Historical Provider, MD  Multiple Vitamin (MULTIVITAMIN WITH MINERALS) TABS Take 1 tablet by mouth daily.    Historical Provider, MD    Social History:  reports that he has been smoking Cigarettes.  He has a 30 pack-year smoking history. He does not have any smokeless tobacco history on file. He reports that he does not drink alcohol or use illicit drugs.  Family History  Problem Relation Age of Onset  . Heart failure Mother   . Thyroid disease Mother   . Cancer Father   . Asthma Brother   . Heart attack Brother   . Cirrhosis Brother     EtOH  . Colon cancer Maternal Aunt      All the positives are listed in BOLD  Review of Systems:  HEENT: Headache, blurred vision, runny nose, sore throat Neck: Hypothyroidism, hyperthyroidism,,lymphadenopathy Chest : Shortness of breath, history of COPD, Asthma Heart : Chest pain, history of coronary arterey disease GI:  Nausea, vomiting, diarrhea, constipation, GERD GU: Dysuria, urgency, frequency of urination, hematuria Neuro: Stroke, seizures, syncope Psych: Depression, anxiety, hallucinations    Physical Exam: Blood pressure 120/55, pulse 58, temperature 98.5 F (36.9 C), temperature source Oral,  resp. rate 12, SpO2 100.00%. Constitutional:   Patient is a well-developed and well-nourished male * in no acute distress and cooperative with exam. Head: Normocephalic and atraumatic Mouth: Mucus membranes moist Eyes: PERRL, EOMI, conjunctivae normal Neck: Supple, No Thyromegaly Cardiovascular: RRR, S1 normal, S2 normal Pulmonary/Chest: CTAB, no wheezes, rales, or rhonchi Abdominal: Soft. Non-tender, non-distended, bowel sounds are normal, no masses, organomegaly, or guarding present.  Neurological: Alert but not oriented x3, confused Extremities : Trace edema of the lower extremities   Labs on Admission:  Results for orders placed during the hospital encounter of 05/16/13 (from the past 48 hour(s))  CBC WITH DIFFERENTIAL     Status: Abnormal   Collection Time  05/16/13 11:20 PM      Result Value Range   WBC 2.9 (*) 4.0 - 10.5 K/uL   RBC 3.79 (*) 4.22 - 5.81 MIL/uL   Hemoglobin 9.9 (*) 13.0 - 17.0 g/dL   HCT 32.4 (*) 39.0 - 52.0 %   MCV 85.5  78.0 - 100.0 fL   MCH 26.1  26.0 - 34.0 pg   MCHC 30.6  30.0 - 36.0 g/dL   RDW 20.9 (*) 11.5 - 15.5 %   Platelets 57 (*) 150 - 400 K/uL   Neutrophils Relative % 76  43 - 77 %   Lymphocytes Relative 12  12 - 46 %   Monocytes Relative 7  3 - 12 %   Eosinophils Relative 4  0 - 5 %   Basophils Relative 1  0 - 1 %   Neutro Abs 2.3  1.7 - 7.7 K/uL   Lymphs Abs 0.3 (*) 0.7 - 4.0 K/uL   Monocytes Absolute 0.2  0.1 - 1.0 K/uL   Eosinophils Absolute 0.1  0.0 - 0.7 K/uL   Basophils Absolute 0.0  0.0 - 0.1 K/uL   Smear Review LARGE PLATELETS PRESENT     Comment: PLATELETS APPEAR DECREASED     PLATELET COUNT CONFIRMED BY SMEAR  COMPREHENSIVE METABOLIC PANEL     Status: Abnormal   Collection Time    05/16/13 11:20 PM      Result Value Range   Sodium 137  135 - 145 mEq/L   Potassium 4.2  3.5 - 5.1 mEq/L   Chloride 104  96 - 112 mEq/L   CO2 30  19 - 32 mEq/L   Glucose, Bld 106 (*) 70 - 99 mg/dL   BUN 14  6 - 23 mg/dL   Creatinine, Ser 0.81   0.50 - 1.35 mg/dL   Calcium 9.1  8.4 - 10.5 mg/dL   Total Protein 6.9  6.0 - 8.3 g/dL   Albumin 3.0 (*) 3.5 - 5.2 g/dL   AST 36  0 - 37 U/L   ALT 13  0 - 53 U/L   Alkaline Phosphatase 146 (*) 39 - 117 U/L   Total Bilirubin 1.6 (*) 0.3 - 1.2 mg/dL   GFR calc non Af Amer >90  >90 mL/min   GFR calc Af Amer >90  >90 mL/min   Comment: (NOTE)     The eGFR has been calculated using the CKD EPI equation.     This calculation has not been validated in all clinical situations.     eGFR's persistently <90 mL/min signify possible Chronic Kidney     Disease.  PRO B NATRIURETIC PEPTIDE     Status: None   Collection Time    05/16/13 11:20 PM      Result Value Range   Pro B Natriuretic peptide (BNP) 34.3  0 - 125 pg/mL  TROPONIN I     Status: None   Collection Time    05/16/13 11:20 PM      Result Value Range   Troponin I <0.30  <0.30 ng/mL   Comment:            Due to the release kinetics of cTnI,     a negative result within the first hours     of the onset of symptoms does not rule out     myocardial infarction with certainty.     If myocardial infarction is still suspected,     repeat the test at appropriate intervals.  PROTIME-INR  Status: Abnormal   Collection Time    05/16/13 11:20 PM      Result Value Range   Prothrombin Time 18.5 (*) 11.6 - 15.2 seconds   INR 1.59 (*) 0.00 - 1.49  LIPASE, BLOOD     Status: None   Collection Time    05/16/13 11:20 PM      Result Value Range   Lipase 59  11 - 59 U/L  AMMONIA     Status: Abnormal   Collection Time    05/16/13 11:20 PM      Result Value Range   Ammonia 153 (*) 11 - 60 umol/L  URINALYSIS, ROUTINE W REFLEX MICROSCOPIC     Status: Abnormal   Collection Time    05/17/13 12:02 AM      Result Value Range   Color, Urine YELLOW  YELLOW   APPearance CLEAR  CLEAR   Specific Gravity, Urine 1.015  1.005 - 1.030   pH 8.5 (*) 5.0 - 8.0   Glucose, UA NEGATIVE  NEGATIVE mg/dL   Hgb urine dipstick TRACE (*) NEGATIVE   Bilirubin Urine  NEGATIVE  NEGATIVE   Ketones, ur NEGATIVE  NEGATIVE mg/dL   Protein, ur NEGATIVE  NEGATIVE mg/dL   Urobilinogen, UA 1.0  0.0 - 1.0 mg/dL   Nitrite NEGATIVE  NEGATIVE   Leukocytes, UA NEGATIVE  NEGATIVE  URINE MICROSCOPIC-ADD ON     Status: Abnormal   Collection Time    05/17/13 12:02 AM      Result Value Range   Squamous Epithelial / LPF RARE  RARE   RBC / HPF 0-2  <3 RBC/hpf   Bacteria, UA FEW (*) RARE    Radiological Exams on Admission: Dg Chest Port 1 View  05/16/2013   CLINICAL DATA:  Generalized weakness.  Increased urinary frequency.  EXAM: PORTABLE CHEST - 1 VIEW  COMPARISON:  Single view of the chest 04/17/2013.  FINDINGS: Heart size is upper normal. Lungs are clear. No pneumothorax or pleural fluid.  IMPRESSION: No acute disease.   Electronically Signed   By: Inge Rise M.D.   On: 05/16/2013 23:46    Assessment/Plan Active Problems:   DM (diabetes mellitus)   HTN (hypertension)   Coagulopathy   Cirrhosis   Seizure disorder   Pancytopenia   Hyperammonemia  Altered mental status Secondary to hepatic encephalopathy Patient has liver cirrhosis due to [facility], has elevated levels of ammonia We'll continue him on lactulose 40 g by mouth 3 times a day Follow ammonia levels in the morning  Chest pain Patient complains of chest pain this morning as per patient's wife EKG in the ED is normal sinus rhythm with no ST-T changes First set  of cardiac enzymes is negative Will obtain troponin every 6 hours x3 Will monitor the patient in telemetry  Dysuria Patient's UA is negative Will obtain urine culture has patient's only complaint was dysuria He is afebrile with no elevated WBCs Will not start antibiotics unless he develops fever or urine culture becomes positive  Diabetes mellitus Will hold the Amaryl Start sliding scale insulin  Seizure disorder Will continue Keppra 750 mg by mouth twice a day  Hypertension Continue metoprolol 25 m by mouth twice a  day  Depression Continue Prozac 20 g by mouth daily  Pancytopenia Patient has chronic pancytopenia due to liver disease We'll continue to monitor in the hospital Patient is afebrile at this time  Coagulopathy Secondary to liver disease INR today is 1.59  DVT prophylaxis SCDs  Code  status: DO NOT RESUSCITATE  Family discussion: Called and discussed with patient's wife on phone. As per patient's wife patient is DO NOT RESUSCITATE   Time Spent on Admission: 61 min  [facility] Pager: [phone] 05/17/2013, 12:56 AM  If 7PM-7AM, please contact night-coverage  [email or web address]  Password [email or web address]

## 2013-05-17 NOTE — Progress Notes (Addendum)
52 yo M with cirrhosis secondary to NASH on lactulose TID at home to prevent hepatic encephalopathy.  Restarted on lactulose TID and ammonia has trended down.  Patient denies nausea, vomiting, diarrhea, but confused during interview.  Has left shoulder pain which is chronic.  No documented stools since admission.    Wt Readings from Last 3 Encounters:  04/17/13 90.5 kg (199 lb 8.3 oz)  02/02/13 92.352 kg (203 lb 9.6 oz)  09/20/12 85.9 kg (189 lb 6 oz)   Temp Readings from Last 3 Encounters:  05/17/13 98.3 F (36.8 C) Axillary  04/19/13 98.3 F (36.8 C) Oral  02/28/13 99 F (37.2 C) Oral   BP Readings from Last 3 Encounters:  05/17/13 134/78  04/19/13 125/50  02/28/13 133/67   Pulse Readings from Last 3 Encounters:  05/17/13 60  04/19/13 60  02/28/13 63   GEN:   CM, NAD HEENT:  Anicteric, PERRL, NCAT CV:  RRR, no mrg, 2+ pulses  PULM:  CTAB ABD:  Soft, NT/ND, NABS MSK:  No LEE SKin:  Multiple telangiectasias  A/P:  Hepatic encephalopathy, ammonia levels trending down on home lactulose dose, however, no BMs documented. Lactulose enema if no BMs by end of day today.  Minimize sedating medications such as narcotics and benzodiazepines.  Start capsaicin cream for joint pains.  Anticipate discharge to home once mentation improved.  Agree with excellent assessment and plan by Ms. Dyanne Carrel, NP.

## 2013-05-17 NOTE — Progress Notes (Signed)
[facility] PROGRESS NOTE  [name] [name] QMV:[id number] DOB: [birth year] DOA: 05/16/2013 PCP: Mitzi Hansen, NP  Assessment/Plan: Altered mental status  Secondary to hepatic encephalopathy in setting of liver cirrhosis due to [place]. Ammonia level trending down. Will continue lactulose 40 g by mouth 3 times a day.Follow ammonia levels in the morning   Chest pain  Patient denies chest pain this am. Reports right shoulder pain. EKG in the ED was normal sinus rhythm with no ST-T changes .Cardiac enzymes neg to date. No event on tele.    Dysuria  Patient's UA is negative. Urine culture results pending. He remains afebrile with no elevated WBCs .Will not start antibiotics unless he develops fever or urine culture becomes positive   Diabetes mellitus   Will check hgA1c. Will start carb modified diet as patient more alert.  Continue to  hold the Amaryl . Continue SSI   Seizure disorder   No seizure activity. Will continue Keppra 750 mg by mouth twice a day   Hypertension  Controlled. Continue metoprolol 25 m by mouth twice a day   Depression   Stable at baseline. Continue Prozac 20 g by mouth daily   Pancytopenia   Stable at baseline. 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 We'll continue to monitor in the hospital Patient is afebrile at this time   Coagulopathy  Secondary to liver disease  INR today is 1.80   DVT prophylaxis  SCDs    Code Status: DNR Family Communication: none available Disposition Plan: home when ready   Consultants:  none  Procedures:  none  Antibiotics:   (indicate start date, and stop date if known)  HPI/Subjective: Awake. Alert. Oriented to self only. Reports right shoulder "ache". Denies CP/abdominal pain/nausea. Cooperative.  Objective: Filed Vitals:   05/17/13 0521  BP: 134/78  Pulse: 60  Temp: 98.3 F (36.8 C)  Resp: 12    Intake/Output Summary (Last 24 hours) at 05/17/13 1030 Last data filed at 05/17/13  0900  Gross per 24 hour  Intake      0 ml  Output    700 ml  Net   -700 ml   There were no vitals filed for this visit.  Exam:   General:  Well nourished  Cardiovascular: RRR No MGR trace LEE PPP  Respiratory: normal effort BS clear bilaterally to auscultation. No wheeze  Abdomen: round, non-distended +BS non-tender to palpation  Musculoskeletal: no clubbing no cyanosis  Neuro: oriented to self only. Cranial nerve II-XII intact. Follow commands. cooperative   Data Reviewed: Basic Metabolic Panel:  Recent Labs Lab 05/16/13 2320 05/17/13 0524  NA 137 140  K 4.2 3.7  CL 104 108  CO2 30 26  GLUCOSE 106* 85  BUN 14 13  CREATININE 0.81 0.62  CALCIUM 9.1 8.6   Liver Function Tests:  Recent Labs Lab 05/16/13 2320 05/17/13 0524  AST 36 28  ALT 13 11  ALKPHOS 146* 118*  BILITOT 1.6* 1.7*  PROT 6.9 5.7*  ALBUMIN 3.0* 2.5*    Recent Labs Lab 05/16/13 2320  LIPASE 59    Recent Labs Lab 05/16/13 2320 05/17/13 0524  AMMONIA 153* 71*   CBC:  Recent Labs Lab 05/16/13 2320 05/17/13 0524  WBC 2.9* 2.6*  NEUTROABS 2.3  --   HGB 9.9* 8.6*  HCT 32.4* 27.8*  MCV 85.5 85.0  PLT 57* 50*   Cardiac Enzymes:  Recent Labs Lab 05/16/13 2320 05/17/13 0140 05/17/13 0706  TROPONINI <0.30 <0.30 <0.30   BNP (last  3 results)  Recent Labs  05/16/13 2320  PROBNP 34.3   CBG:  Recent Labs Lab 05/17/13 0749  GLUCAP 77    No results found for this or any previous visit (from the past 240 hour(s)).   Studies: Dg Chest Port 1 View  05/16/2013   CLINICAL DATA:  Generalized weakness.  Increased urinary frequency.  EXAM: PORTABLE CHEST - 1 VIEW  COMPARISON:  Single view of the chest 04/17/2013.  FINDINGS: Heart size is upper normal. Lungs are clear. No pneumothorax or pleural fluid.  IMPRESSION: No acute disease.   Electronically Signed   By: Drusilla Kanner M.D.   On: 05/16/2013 23:46    Scheduled Meds: . aspirin EC  81 mg Oral Daily  . capsaicin    Topical BID  . FLUoxetine  20 mg Oral Daily  . insulin aspart  0-9 Units Subcutaneous TID WC  . lactulose  40 g Oral TID  . levETIRAcetam  750 mg Oral BID  . metoprolol tartrate  50 mg Oral QHS  . metoprolol tartrate  25 mg Oral Daily  . sodium chloride  3 mL Intravenous Q12H   Continuous Infusions:   Active Problems:   DM (diabetes mellitus)   HTN (hypertension)   Coagulopathy   Cirrhosis   Seizure disorder   Pancytopenia   Hyperammonemia    Time spent: 30 minutes    [facility] M  [facility] Pager [phone]. If 7PM-7AM, please contact night-coverage at [email or web address], password [facility] 05/17/2013, 10:30 AM  LOS: 1 day

## 2013-05-18 DIAGNOSIS — D61818 Other pancytopenia: Secondary | ICD-10-CM

## 2013-05-18 LAB — GLUCOSE, CAPILLARY

## 2013-05-18 LAB — CBC
HCT: 28.4 % — ABNORMAL LOW (ref 39.0–52.0)
Hemoglobin: 8.9 g/dL — ABNORMAL LOW (ref 13.0–17.0)
MCH: 27.1 pg (ref 26.0–34.0)
MCHC: 31.3 g/dL (ref 30.0–36.0)
MCV: 86.6 fL (ref 78.0–100.0)
RDW: 20.7 % — ABNORMAL HIGH (ref 11.5–15.5)
WBC: 2.5 10*3/uL — ABNORMAL LOW (ref 4.0–10.5)

## 2013-05-18 LAB — BASIC METABOLIC PANEL
BUN: 13 mg/dL (ref 6–23)
Calcium: 8.3 mg/dL — ABNORMAL LOW (ref 8.4–10.5)
Chloride: 108 mEq/L (ref 96–112)
Creatinine, Ser: 0.7 mg/dL (ref 0.50–1.35)
GFR calc Af Amer: >90 mL/min (ref >90)
GFR calc non Af Amer: >90 mL/min (ref >90)
Glucose, Bld: 91 mg/dL (ref 70–99)
Potassium: 3.7 mEq/L (ref 3.5–5.1)

## 2013-05-18 LAB — AMMONIA: Ammonia: 49 umol/L (ref 11–60)

## 2013-05-18 MED ORDER — LIDOCAINE HCL 2 % EX GEL: CUTANEOUS | Status: DC | PRN

## 2013-05-18 MED ORDER — HYDROCODONE-ACETAMINOPHEN 5-325 MG PO TABS: 1.0000 | ORAL_TABLET | Freq: Two times a day (BID) | ORAL | Status: DC | PRN

## 2013-05-18 NOTE — Progress Notes (Signed)
Pt instructed to decrease hydrocodone/acetaminophen to twice a day (or every twelve hours), verbalized understanding.  Pt's wife aware and given medication information about rifaximin.  Take via wheelchair to main entrance by myself.

## 2013-05-18 NOTE — Discharge Summary (Signed)
Physician Discharge Summary  [name] [name] BSJ:[id number] DOB: 1961-07-20 DOA: 05/16/2013  PCP: [name] Rota, NP  Admit date: 05/16/2013 Discharge date: 05/18/2013  Time spent: 40 minutes  Recommendations for Outpatient Follow-up:  1. Follow up with PCP in 1 week for evaluation of symptoms. Patient's wife given information about Rifaximin. May want to consider if patient continues to have episodes of encephalopathy while on lactulose.   Discharge Diagnoses:  Active Problems:   DM (diabetes mellitus)   HTN (hypertension)   Coagulopathy   Cirrhosis   Seizure disorder   Pancytopenia   Hyperammonemia   Discharge Condition: medically stable and at baseline at discharge  Diet recommendation: carb modified  Filed Weights   05/17/13 1113  Weight: 93.9 kg (207 lb 0.2 oz)    History of present illness:  52 year old male with a history of liver cirrhosis secondary to [name] [name] who was brought to the ED from home on 05/17/13 due to altered mental status. Patient's only complaint in the ED was dysuria, and was unable to provide any meaningful history due to confusion. Called and discussed with patient's wife who said that patient had been confused at home. He did not have nausea vomiting or diarrhea but he did complain of chest pain that morning when he was getting flu shot. In the ED he was found to have elevated levels of ammonia. 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 Patient was recently discharged from the hospital after he was admitted for seizure, at that time his Keppra was increased to 750 mg twice a day.      Hospital Course:  Altered mental status  Secondary to hepatic encephalopathy in setting of liver cirrhosis due to [name]. Admitted to floor and lactulose resumed at home dose.  Ammonia level began to trend down. At discharge ammonia level 49 form 153 on admission. Will discharge on lactulose TID. Wife concerned about  increasing dose to QID. She was given information regarding rifaximin to follow up with PCP if patient continues to have episodes of encephalopathy on lactulose. Also recommend minimizing sedating medications such as narcotics and benzodiazepines.  Recommend follow up with PCP 1 week for evaluation of symptoms  Chest pain  Patient denies chest pain during this hospitalization. Reports left shoulder pain. EKG in the ED was normal sinus rhythm with no ST-T changes .Cardiac enzymes neg to date. No event on tele. Shoulder pain chronic. Provided with capsaicin cream for joint pain.   Dysuria  Patient's UA is negative. Urine culture results pending. He remained afebrile with no elevated WBCs. Antibiotics no indicated. Will follow urine culture.   Diabetes mellitus    Controlled. Provided with carb modified diet.  Seizure disorder  No seizure activity. Will continue Keppra 750 mg by mouth twice a day  Hypertension  Controlled. Continue metoprolol 25 m by mouth twice a day  Depression   Appeared stable at baseline during this hospitalizaton. Continue Prozac 20 g by mouth daily  Pancytopenia  Chart review indicates sCable at baseline. 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  Coagulopathy  Secondary to liver disease  INR today is 1.80   Procedures:  none  Consultations:  none  Discharge Exam: Filed Vitals:   05/18/13 0923  BP:   Pulse: 60  Temp:   Resp:     General: sitting up in chair, alert Cardiovascular: RRR No MGR trace LE edema Respiratory: normal effort BS clear bilaterally no wheeze Abdomen: soft +BS non-tender  to palpation  Discharge Instructions      Discharge Orders   Future Orders Complete By Expires   Diet - low sodium heart healthy  As directed    Discharge instructions  As directed    Comments:     Follow up with PCP in 1 week for evaluation of symptoms. Take medications as directed. May want to investigate medication Rifaximin with PCP if  continue to experience episodes of encephalopathy while taking lactulose   Increase activity slowly  As directed        Medication List    STOP taking these medications       ALPRAZolam 0.5 MG tablet  Commonly known as:  XANAX      TAKE these medications       aspirin EC 81 MG tablet  Take 81 mg by mouth daily.     fish oil-omega-3 fatty acids 1000 MG capsule  Take 1 g by mouth 3 (three) times daily.     FLUoxetine 20 MG capsule  Commonly known as:  PROZAC  Take 1 capsule (20 mg total) by mouth daily.     furosemide 20 MG tablet  Commonly known as:  LASIX  Take 20 mg by mouth daily.     glimepiride 2 MG tablet  Commonly known as:  AMARYL  Take 2 mg by mouth daily before breakfast.     HYDROcodone-acetaminophen 5-325 MG per tablet  Commonly known as:  NORCO/VICODIN  Take 1 tablet by mouth every 12 (twelve) hours as needed for pain.     lactulose 10 GM/15ML solution  Commonly known as:  CHRONULAC  Take 60 mLs (40 g total) by mouth 3 (three) times daily. Titrate for 3-4 bowel motions daily.     levETIRAcetam 500 MG tablet  Commonly known as:  KEPPRA  Take 1.5 tablets (750 mg total) by mouth 2 (two) times daily.     lidocaine 2 % jelly  Commonly known as:  XYLOCAINE  Apply topically as needed.     metoprolol tartrate 25 MG tablet  Commonly known as:  LOPRESSOR  Take 25-50 mg by mouth 2 (two) times daily. Take 25 mg in the morning and 50 mg at night     multivitamin with minerals Tabs tablet  Take 1 tablet by mouth daily.     omeprazole 20 MG capsule  Commonly known as:  PRILOSEC  Take 1 capsule (20 mg total) by mouth daily.     potassium chloride SA 20 MEQ tablet  Commonly known as:  K-DUR,KLOR-CON  Take 1 tablet (20 mEq total) by mouth daily.     Vitamin D 2000 UNITS tablet  Take 1 tablet (2,000 Units total) by mouth daily.       Allergies  Allergen Reactions  . Penicillins Rash   Follow-up Information   Follow up with DANIEL,JANICE, NP. Schedule  an appointment as soon as possible for a visit in 1 week. (evaluation of symptoms. May want to explore rifaximin if contiue to have episodes of encephalopathy on lactulose)    Specialty:  Nurse Practitioner   Contact information:   [address] [place] [address] [phone]        The results of significant diagnostics from this hospitalization (including imaging, microbiology, ancillary and laboratory) are listed below for reference.    Significant Diagnostic Studies: Dg Chest Port 1 View  05/16/2013   CLINICAL DATA:  Generalized weakness.  Increased urinary frequency.  EXAM: PORTABLE CHEST - 1 VIEW  COMPARISON:  Single view of the chest 04/17/2013.  FINDINGS: Heart size is upper normal. Lungs are clear. No pneumothorax or pleural fluid.  IMPRESSION: No acute disease.   Electronically Signed   By: Inge Rise M.D.   On: 05/16/2013 23:46    Microbiology: No results found for this or any previous visit (from the past 240 hour(s)).   Labs: Basic Metabolic Panel:  Recent Labs Lab 05/16/13 2320 05/17/13 0524 05/18/13 0514  NA 137 140 139  K 4.2 3.7 3.7  CL 104 108 108  CO2 30 26 26   GLUCOSE 106* 85 91  BUN 14 13 13   CREATININE 0.81 0.62 0.70  CALCIUM 9.1 8.6 8.3*   Liver Function Tests:  Recent Labs Lab 05/16/13 2320 05/17/13 0524  AST 36 28  ALT 13 11  ALKPHOS 146* 118*  BILITOT 1.6* 1.7*  PROT 6.9 5.7*  ALBUMIN 3.0* 2.5*    Recent Labs Lab 05/16/13 2320  LIPASE 59    Recent Labs Lab 05/16/13 2320 05/17/13 0524 05/18/13 0514  AMMONIA 153* 71* 49   CBC:  Recent Labs Lab 05/16/13 2320 05/17/13 0524 05/18/13 0514  WBC 2.9* 2.6* 2.5*  NEUTROABS 2.3  --   --   HGB 9.9* 8.6* 8.9*  HCT 32.4* 27.8* 28.4*  MCV 85.5 85.0 86.6  PLT 57* 50* 45*   Cardiac Enzymes:  Recent Labs Lab 05/16/13 2320 05/17/13 0140 05/17/13 0706 05/17/13 1317  TROPONINI <0.30 <0.30 <0.30 <0.30   BNP: BNP (last 3 results)  Recent Labs   05/16/13 2320  PROBNP 34.3   CBG:  Recent Labs Lab 05/17/13 0749 05/17/13 1126 05/17/13 1618 05/17/13 2034 05/18/13 0808  GLUCAP 77 79 86 108* 92       Signed:  BLACK,KAREN M  [facility] 05/18/2013, 11:33 AM

## 2013-05-18 NOTE — Discharge Summary (Signed)
Patient states he feels back to baseline.  His left foot hurts chronically.  He would like to go home.  Vital signs stable.  Patient alert and oriented on exam.  Exam otherwise unchanged from yesterday.  Discharge to home today on lactulose and information on rifaximin for family to discuss with primary care doctor and hepatologist.

## 2013-05-18 NOTE — Progress Notes (Signed)
No iv in place at this time. Pt given d/c instructions and new prescriptions.  Discussed home care with patient and discussed home medications, patient verbalizes understanding, teachback completed. F/U appointment in place for Oct 3rd at 10:30am with Mitzi Hansen FNP, pt states they will keep appointment. Pt is stable at this time. Pt taken to main entrance in wheelchair by staff member.

## 2013-05-19 LAB — URINE CULTURE: Colony Count: NO GROWTH

## 2013-08-01 ENCOUNTER — Other Ambulatory Visit: Payer: Self-pay

## 2013-08-01 ENCOUNTER — Emergency Department (HOSPITAL_COMMUNITY): Payer: Medicare Other

## 2013-08-01 ENCOUNTER — Inpatient Hospital Stay (HOSPITAL_COMMUNITY)
Admission: EM | Admit: 2013-08-01 | Discharge: 2013-08-04 | DRG: 442 | Disposition: A | Discharge status: Home Health | Payer: Medicare Other | Attending: Family Medicine | Admitting: Family Medicine

## 2013-08-01 ENCOUNTER — Encounter (HOSPITAL_COMMUNITY): Payer: Self-pay | Admitting: Emergency Medicine

## 2013-08-01 DIAGNOSIS — R55 Syncope and collapse: Secondary | ICD-10-CM

## 2013-08-01 DIAGNOSIS — K7290 Hepatic failure, unspecified without coma: Principal | ICD-10-CM

## 2013-08-01 DIAGNOSIS — K7460 Unspecified cirrhosis of liver: Secondary | ICD-10-CM | POA: Diagnosis present

## 2013-08-01 DIAGNOSIS — F411 Generalized anxiety disorder: Secondary | ICD-10-CM | POA: Diagnosis present

## 2013-08-01 DIAGNOSIS — F17200 Nicotine dependence, unspecified, uncomplicated: Secondary | ICD-10-CM | POA: Diagnosis present

## 2013-08-01 DIAGNOSIS — Z7982 Long term (current) use of aspirin: Secondary | ICD-10-CM

## 2013-08-01 DIAGNOSIS — H5440 Blindness, one eye, unspecified eye: Secondary | ICD-10-CM | POA: Diagnosis present

## 2013-08-01 DIAGNOSIS — D61818 Other pancytopenia: Secondary | ICD-10-CM | POA: Diagnosis present

## 2013-08-01 DIAGNOSIS — R142 Eructation: Secondary | ICD-10-CM | POA: Diagnosis present

## 2013-08-01 DIAGNOSIS — Z800 Family history of malignant neoplasm of digestive organs: Secondary | ICD-10-CM

## 2013-08-01 DIAGNOSIS — R141 Gas pain: Secondary | ICD-10-CM | POA: Diagnosis present

## 2013-08-01 DIAGNOSIS — G40909 Epilepsy, unspecified, not intractable, without status epilepticus: Secondary | ICD-10-CM | POA: Diagnosis present

## 2013-08-01 DIAGNOSIS — Z825 Family history of asthma and other chronic lower respiratory diseases: Secondary | ICD-10-CM

## 2013-08-01 DIAGNOSIS — R4182 Altered mental status, unspecified: Secondary | ICD-10-CM

## 2013-08-01 DIAGNOSIS — J449 Chronic obstructive pulmonary disease, unspecified: Secondary | ICD-10-CM

## 2013-08-01 DIAGNOSIS — E785 Hyperlipidemia, unspecified: Secondary | ICD-10-CM | POA: Diagnosis present

## 2013-08-01 DIAGNOSIS — H9190 Unspecified hearing loss, unspecified ear: Secondary | ICD-10-CM | POA: Diagnosis present

## 2013-08-01 DIAGNOSIS — J4489 Other specified chronic obstructive pulmonary disease: Secondary | ICD-10-CM | POA: Diagnosis present

## 2013-08-01 DIAGNOSIS — K7200 Acute and subacute hepatic failure without coma: Secondary | ICD-10-CM

## 2013-08-01 DIAGNOSIS — Z79899 Other long term (current) drug therapy: Secondary | ICD-10-CM

## 2013-08-01 DIAGNOSIS — E119 Type 2 diabetes mellitus without complications: Secondary | ICD-10-CM | POA: Diagnosis present

## 2013-08-01 DIAGNOSIS — K219 Gastro-esophageal reflux disease without esophagitis: Secondary | ICD-10-CM | POA: Diagnosis present

## 2013-08-01 DIAGNOSIS — F79 Unspecified intellectual disabilities: Secondary | ICD-10-CM | POA: Diagnosis present

## 2013-08-01 DIAGNOSIS — M549 Dorsalgia, unspecified: Secondary | ICD-10-CM | POA: Diagnosis present

## 2013-08-01 DIAGNOSIS — D649 Anemia, unspecified: Secondary | ICD-10-CM | POA: Diagnosis present

## 2013-08-01 DIAGNOSIS — M129 Arthropathy, unspecified: Secondary | ICD-10-CM | POA: Diagnosis present

## 2013-08-01 DIAGNOSIS — Z8249 Family history of ischemic heart disease and other diseases of the circulatory system: Secondary | ICD-10-CM

## 2013-08-01 DIAGNOSIS — I10 Essential (primary) hypertension: Secondary | ICD-10-CM | POA: Diagnosis present

## 2013-08-01 DIAGNOSIS — K7682 Hepatic encephalopathy: Principal | ICD-10-CM | POA: Diagnosis present

## 2013-08-01 DIAGNOSIS — E7220 Disorder of urea cycle metabolism, unspecified: Secondary | ICD-10-CM

## 2013-08-01 DIAGNOSIS — G8929 Other chronic pain: Secondary | ICD-10-CM | POA: Diagnosis present

## 2013-08-01 DIAGNOSIS — F319 Bipolar disorder, unspecified: Secondary | ICD-10-CM | POA: Diagnosis present

## 2013-08-01 LAB — CBC WITH DIFFERENTIAL/PLATELET
Basophils Absolute: 0 10*3/uL (ref 0.0–0.1)
Eosinophils Relative: 6 % — ABNORMAL HIGH (ref 0–5)
Lymphocytes Relative: 38 % (ref 12–46)
Lymphs Abs: 1 10*3/uL (ref 0.7–4.0)
MCV: 89.8 fL (ref 78.0–100.0)
Neutro Abs: 1.1 10*3/uL — ABNORMAL LOW (ref 1.7–7.7)
Platelets: 62 10*3/uL — ABNORMAL LOW (ref 150–400)
RBC: 3.72 MIL/uL — ABNORMAL LOW (ref 4.22–5.81)
RDW: 19.3 % — ABNORMAL HIGH (ref 11.5–15.5)
WBC: 2.5 10*3/uL — ABNORMAL LOW (ref 4.0–10.5)

## 2013-08-01 LAB — COMPREHENSIVE METABOLIC PANEL
ALT: 27 U/L (ref 0–53)
AST: 98 U/L — ABNORMAL HIGH (ref 0–37)
Albumin: 2.8 g/dL — ABNORMAL LOW (ref 3.5–5.2)
Alkaline Phosphatase: 109 U/L (ref 39–117)
BUN: 8 mg/dL (ref 6–23)
CO2: 29 mEq/L (ref 19–32)
Calcium: 8.6 mg/dL (ref 8.4–10.5)
Chloride: 100 mEq/L (ref 96–112)
Creatinine, Ser: 0.74 mg/dL (ref 0.50–1.35)
GFR calc non Af Amer: >90 mL/min (ref >90)
Glucose, Bld: 100 mg/dL — ABNORMAL HIGH (ref 70–99)
Total Bilirubin: 1.6 mg/dL — ABNORMAL HIGH (ref 0.3–1.2)

## 2013-08-01 LAB — URINALYSIS, ROUTINE W REFLEX MICROSCOPIC
Hgb urine dipstick: NEGATIVE
Leukocytes, UA: NEGATIVE
Nitrite: NEGATIVE
Protein, ur: NEGATIVE mg/dL
Specific Gravity, Urine: 1.015 (ref 1.005–1.030)
Urobilinogen, UA: 0.2 mg/dL (ref 0.0–1.0)

## 2013-08-01 LAB — AMMONIA: Ammonia: 104 umol/L — ABNORMAL HIGH (ref 11–60)

## 2013-08-01 LAB — ACETAMINOPHEN LEVEL: Acetaminophen (Tylenol), Serum: <15 ug/mL (ref 10–30)

## 2013-08-01 LAB — LIPASE, BLOOD: Lipase: 42 U/L (ref 11–59)

## 2013-08-01 LAB — PRO B NATRIURETIC PEPTIDE: Pro B Natriuretic peptide (BNP): 21.1 pg/mL (ref 0–125)

## 2013-08-01 LAB — TROPONIN I: Troponin I: <0.3 ng/mL (ref <0.30)

## 2013-08-01 MED ORDER — SODIUM CHLORIDE 0.9 % IV SOLN
INTRAVENOUS | Status: DC
  Administered 2013-08-01: 23:00:00 via INTRAVENOUS

## 2013-08-01 MED ORDER — SODIUM CHLORIDE 0.9 % IV BOLUS (SEPSIS)
250.0000 mL | Freq: Once | INTRAVENOUS | Status: AC
  Administered 2013-08-01: 250 mL via INTRAVENOUS

## 2013-08-01 NOTE — ED Notes (Signed)
Pt was found by family on the floor face-down with decreased loc.  Per family, pt has multiple bottles of narcotic meds that are empty and is unknown if pt may have taken too many at this time.  Pt is fully immobilized per ems, is awake, slow to respond but opens eyes to name called.

## 2013-08-01 NOTE — ED Notes (Signed)
Patient remains in the supine position with C-collar in place.

## 2013-08-01 NOTE — ED Provider Notes (Addendum)
CSN: [id number]     Arrival date & time 08/01/13  2126 History  This chart was scribed for [name] Kung, MD by Zettie Pho, ED Scribe. This patient was seen in room APA18/AP[facility]18 and the patient's care was started at 9:44 PM.    Chief Complaint  Patient presents with  . Fall  . Altered Mental Status   Patient is a 52 y.o. male presenting with fall and altered mental status. The history is provided by the patient and the spouse. The history is limited by the condition of the patient. No language interpreter was used.  Fall This is a new problem. The current episode started 1 to 2 hours ago. The problem has not changed since onset.Pertinent negatives include no chest pain and no abdominal pain. He has tried nothing for the symptoms.  Altered Mental Status Presenting symptoms: partial responsiveness   Severity:  Mild Most recent episode:  Today Associated symptoms: no abdominal pain    HPI Comments: [name] [name] is a 52 y.o. male with a history of mental retardation, depression, bipolar 1 disorder, and seizures who presents to the Emergency Department complaining of a fall with associated altered mental status that occurred PTA when his wife reports that she found the patient face-down on the floor and he was partially unresponsive. She reports that she saw the patient 2 hours prior to the incident and he appeared at his baseline at that time. Patient reports that he does not remember falling. His family reports that they found multiple, empty bottles of narcotic medications and do not know if the patient took too many prior to this incident. He denies chest pain, abdominal pain. Patient is blind in his right eye. Patient also has a history of cirrhosis, HTN, DM, COPD, arthritis.   Past Medical History  Diagnosis Date  . Depression   . 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 Bipolar 1 disorder   . Hypertension   . Diabetes mellitus   . Seizures   . COPD (chronic obstructive pulmonary  disease)   . Mental retardation   . Blind right eye   . GERD (gastroesophageal reflux disease)   . Chronic back pain   . Hepatic encephalopathy   . Anemia   . Thrombocytopenia   . Cardiomegaly   . Anxiety   . Arthritis   . Cholelithiasis     Asymptomatic  . Cerebrovascular disease     Right vertebral artery  . Hard of hearing   . Hyperlipidemia   . Cataract     Right eye  . Blood clots in brain     per patient  . Cirrhosis     diagnosed 07/2010 when presented with first episode of hepatic encephalopathy, afp on 416/13=4.8,  U/S on 12/14/11  liver stable, pt has received 2 hep A/B vaccines  . Diverticulosis    Past Surgical History  Procedure Laterality Date  . Tonsillectomy    . Ear operations    . Cast application  02/23/813    Procedure: MINOR CAST APPLICATION;  Surgeon: [name], MD;  Location: [facility];  Service: Orthopedics;  Laterality: Right;  no anesthesia , procedure room do not need or room !  . Orif ankle fracture  08/27/2011    Procedure: OPEN REDUCTION INTERNAL FIXATION (ORIF) ANKLE FRACTURE;  Surgeon: [name], MD;  Location: [facility];  Service: Orthopedics;  Laterality: Right;  . Esophagogastroduodenoscopy  02/02/2011    Rourk-Normal esophagus.  No varices/Question mild portal gastropathy,  extrinsic compression on the antrum, lesser curvature of uncertain significance, some minimally  nodular mucosa status post biopsy, patent pylorus, normal duodenum 1 and duodenum 2.  . Colonoscopy  12/15/11    colonic divericulosis;poor prep, ACBE  recommended but has not been done  . Esophagogastroduodenoscopy  12/15/11    Rourk-->mild changes of portal gastropathy, gastric/duodenal erosions s/p bx  (reactive changes with mild chronic inflammation. No H.pylori  . Foot surgery     Family History  Problem Relation Age of Onset  . Heart failure Mother   . Thyroid disease Mother   . Cancer Father   . Asthma Brother   . Heart attack Brother   . Cirrhosis Brother      EtOH  . Colon cancer Maternal Aunt    History  Substance Use Topics  . Smoking status: Current Every Day Smoker -- 1.00 packs/day for 30 years    Types: Cigarettes  . Smokeless tobacco: Not on file     Comment: only smokes about 1-2 a day  . Alcohol Use: No    Review of Systems  Unable to perform ROS Cardiovascular: Negative for chest pain.  Gastrointestinal: Negative for abdominal pain.    Allergies  Penicillins  Home Medications   Current Outpatient Rx  Name  Route  Sig  Dispense  Refill  . ALPRAZolam (XANAX) 0.5 MG tablet   Oral   Take 0.5 mg by mouth 3 (three) times daily as needed for anxiety.         Marland Kitchen aspirin EC 81 MG tablet   Oral   Take 81 mg by mouth daily.         . Cholecalciferol (VITAMIN D) 2000 UNITS tablet   Oral   Take 1 tablet (2,000 Units total) by mouth daily.   30 tablet   0   . fish oil-omega-3 fatty acids 1000 MG capsule   Oral   Take 1 g by mouth 3 (three) times daily.         Marland Kitchen FLUoxetine (PROZAC) 20 MG capsule   Oral   Take 1 capsule (20 mg total) by mouth daily.   30 capsule   0   . furosemide (LASIX) 20 MG tablet   Oral   Take 20 mg by mouth daily.         Marland Kitchen glimepiride (AMARYL) 2 MG tablet   Oral   Take 2 mg by mouth daily before breakfast.         . lactulose (CHRONULAC) 10 GM/15ML solution   Oral   Take 60 mLs (40 g total) by mouth 3 (three) times daily. Titrate for 3-4 bowel motions daily.   240 mL   0   . levETIRAcetam (KEPPRA) 500 MG tablet   Oral   Take 500 mg by mouth 2 (two) times daily.         Marland Kitchen lidocaine (XYLOCAINE) 2 % jelly   Topical   Apply topically as needed.   30 mL   0   . metoprolol tartrate (LOPRESSOR) 25 MG tablet   Oral   Take 25-50 mg by mouth 2 (two) times daily. Take 25 mg in the morning and 50 mg at night         . Multiple Vitamin (MULTIVITAMIN WITH MINERALS) TABS   Oral   Take 1 tablet by mouth daily.         Marland Kitchen omeprazole (PRILOSEC) 20 MG capsule   Oral   Take 1  capsule (20 mg total)  by mouth daily.   30 capsule   0   . Oxycodone HCl 10 MG TABS   Oral   Take 10 mg by mouth every 6 (six) hours as needed (for pain).         . potassium chloride SA (K-DUR,KLOR-CON) 20 MEQ tablet   Oral   Take 1 tablet (20 mEq total) by mouth daily.   30 tablet   0    Triage Vitals: BP 168/85  Pulse 62  Temp(Src) 98.9 F (37.2 C) (Oral)  Resp 12  SpO2 98%  Physical Exam  Nursing note and vitals reviewed. Constitutional: He is oriented to person, place, and time. He appears well-developed and well-nourished. No distress.  HENT:  Head: Normocephalic and atraumatic.  Mouth/Throat: Oropharynx is clear and moist.  Eyes: Conjunctivae are normal.  Neck: Normal range of motion. Neck supple.  Cardiovascular: Normal rate, regular rhythm and normal heart sounds.  Exam reveals no gallop and no friction rub.   No murmur heard. Pulmonary/Chest: Effort normal and breath sounds normal. No respiratory distress. He has no wheezes. He has no rales.  Abdominal: Soft. He exhibits no distension. There is no tenderness. There is no rebound and no guarding.  Bowel sounds are present, but decreased.   Musculoskeletal: Normal range of motion.  Left ankle is deformed, which is baseline for the patient secondary to an old fracture.   Neurological: He is alert and oriented to person, place, and time.  Skin: Skin is warm and dry.  Psychiatric: He has a normal mood and affect. His behavior is normal.    ED Course  Procedures (including critical care time)  DIAGNOSTIC STUDIES: Oxygen Saturation is 98% on room air, normal by my interpretation.    COORDINATION OF CARE: 9:52 PM- Will order x-rays of the bilateral hip, right ankle, left hand, and chest. Will order CT scans on the head and C spine. Will order blood labs and UA. Will order IV fluids to manage symptoms. Discussed treatment plan with patient at bedside and patient verbalized agreement.     Labs Review Labs  Reviewed  COMPREHENSIVE METABOLIC PANEL - Abnormal; Notable for the following:    Glucose, Bld 100 (*)    Albumin 2.8 (*)    AST 98 (*)    Total Bilirubin 1.6 (*)    All other components within normal limits  CBC WITH DIFFERENTIAL - Abnormal; Notable for the following:    WBC 2.5 (*)    RBC 3.72 (*)    Hemoglobin 10.3 (*)    HCT 33.4 (*)    RDW 19.3 (*)    Platelets 62 (*)    Neutrophils Relative % 42 (*)    Neutro Abs 1.1 (*)    Monocytes Relative 13 (*)    Eosinophils Relative 6 (*)    All other components within normal limits  AMMONIA - Abnormal; Notable for the following:    Ammonia 104 (*)    All other components within normal limits  PROTIME-INR - Abnormal; Notable for the following:    Prothrombin Time 18.4 (*)    INR 1.58 (*)    All other components within normal limits  LIPASE, BLOOD  PRO B NATRIURETIC PEPTIDE  URINALYSIS, ROUTINE W REFLEX MICROSCOPIC  TROPONIN I  ACETAMINOPHEN LEVEL   Imaging Review Dg Hip Bilateral W/pelvis  08/01/2013   CLINICAL DATA:  Found face down; concern for pelvic injury.  EXAM: BILATERAL HIP WITH PELVIS - 4+ VIEW  COMPARISON:  CT of the abdomen  and pelvis performed 02/02/2011, and bilateral hip radiographs performed 10/05/2010  FINDINGS: There is no evidence of fracture or dislocation. Both femoral heads are seated normally within their respective acetabula. No significant degenerative change is appreciated. The sacroiliac joints are unremarkable in appearance.  The visualized bowel gas pattern is grossly unremarkable in appearance.  IMPRESSION: No evidence of fracture or dislocation.   Electronically Signed   By: Garald Balding M.D.   On: 08/01/2013 23:29   Dg Ankle Complete Right  08/01/2013   CLINICAL DATA:  Found on floor; concern for right ankle injury. History of right ankle fracture.  EXAM: RIGHT ANKLE - COMPLETE 3+ VIEW  COMPARISON:  None.  FINDINGS: There is no definite evidence of acute fracture or dislocation. The plate along the  distal fibula is unchanged in appearance. There is chronic deformity involving the distal tibia and fibula, with chronic talar tilt and surrounding degenerative change. There is diffuse narrowing of the ankle mortise, reflecting degenerative change. Mild chronic lateral soft tissue swelling is noted.  IMPRESSION: No definite evidence of acute fracture or dislocation. Chronic deformity involving the right ankle.   Electronically Signed   By: Garald Balding M.D.   On: 08/01/2013 23:36   Ct Head Wo Contrast  08/01/2013   CLINICAL DATA:  Unresponsive, found down  EXAM: CT HEAD WITHOUT CONTRAST  CT CERVICAL SPINE WITHOUT CONTRAST  TECHNIQUE: Multidetector CT imaging of the head and cervical spine was performed following the standard protocol without intravenous contrast. Multiplanar CT image reconstructions of the cervical spine were also generated.  COMPARISON:  Prior CT from 04/17/2013  FINDINGS: CT HEAD FINDINGS  There is no acute intracranial hemorrhage or infarct. No mass lesion or midline shift. Gray-white matter differentiation is well maintained. Ventricles are normal in size without evidence of hydrocephalus. CSF containing spaces are within normal limits. No extra-axial fluid collection. Tiny remote lacunar infarct within the left basal ganglia again noted, unchanged.  The calvarium is intact.  Heterogeneous calcification within the right globe again noted. Sequelae of prior canal wall down mastoidectomies are also seen bilaterally, also unchanged.  The paranasal sinuses are clear.  Scalp soft tissues are unremarkable.  CT CERVICAL SPINE FINDINGS  Vertebral bodies are normally aligned with preservation of the normal cervical lordosis. Prominent degenerative Schmorl's nodes are seen at the inferior endplate of C4 and C6 as well as the superior endplate of C7. No acute fracture or listhesis. No prevertebral soft tissue swelling. Normal C1-2 articulations are preserved.  Visualized soft tissues of the neck are  within normal limits. No apical pneumothorax.  IMPRESSION: 1. Stable CT of the head with no acute intracranial process identified. 2. No acute traumatic injury within the cervical spine. 3. Moderate degenerative disc disease at C4-5 and C6-7   Electronically Signed   By: Jeannine Boga M.D.   On: 08/01/2013 23:10   Ct Cervical Spine Wo Contrast  08/01/2013   CLINICAL DATA:  Unresponsive, found down  EXAM: CT HEAD WITHOUT CONTRAST  CT CERVICAL SPINE WITHOUT CONTRAST  TECHNIQUE: Multidetector CT imaging of the head and cervical spine was performed following the standard protocol without intravenous contrast. Multiplanar CT image reconstructions of the cervical spine were also generated.  COMPARISON:  Prior CT from 04/17/2013  FINDINGS: CT HEAD FINDINGS  There is no acute intracranial hemorrhage or infarct. No mass lesion or midline shift. Gray-white matter differentiation is well maintained. Ventricles are normal in size without evidence of hydrocephalus. CSF containing spaces are within normal limits. No extra-axial  fluid collection. Tiny remote lacunar infarct within the left basal ganglia again noted, unchanged.  The calvarium is intact.  Heterogeneous calcification within the right globe again noted. Sequelae of prior canal wall down mastoidectomies are also seen bilaterally, also unchanged.  The paranasal sinuses are clear.  Scalp soft tissues are unremarkable.  CT CERVICAL SPINE FINDINGS  Vertebral bodies are normally aligned with preservation of the normal cervical lordosis. Prominent degenerative Schmorl's nodes are seen at the inferior endplate of C4 and C6 as well as the superior endplate of C7. No acute fracture or listhesis. No prevertebral soft tissue swelling. Normal C1-2 articulations are preserved.  Visualized soft tissues of the neck are within normal limits. No apical pneumothorax.  IMPRESSION: 1. Stable CT of the head with no acute intracranial process identified. 2. No acute traumatic  injury within the cervical spine. 3. Moderate degenerative disc disease at C4-5 and C6-7   Electronically Signed   By: Jeannine Boga M.D.   On: 08/01/2013 23:10   Dg Chest Port 1 View  08/01/2013   CLINICAL DATA:  Status post fall; concern for chest injury.  EXAM: PORTABLE CHEST - 1 VIEW  COMPARISON:  Chest radiograph performed 05/16/2013  FINDINGS: The lungs are well-aerated. Mild vascular congestion is noted, without definite pulmonary edema. There is no evidence of focal opacification, pleural effusion or pneumothorax.  The cardiomediastinal silhouette is borderline enlarged. No acute osseous abnormalities are seen.  IMPRESSION: Mild vascular congestion, without definite pulmonary edema. Borderline cardiomegaly. No displaced rib fracture seen.   Electronically Signed   By: Garald Balding M.D.   On: 08/01/2013 22:29   Dg Hand Complete Left  08/01/2013   CLINICAL DATA:  The patient was found on the floor.  Ankle fracture.  EXAM: LEFT HAND - COMPLETE 3+ VIEW  COMPARISON:  None.  FINDINGS: There is a fracture of the scaphoid. Degenerative changes are identified in the distal interphalangeal joints in the 1st carpometacarpal joint. No radiopaque foreign body or soft tissue gas.  IMPRESSION: 1. Scaphoid fracture. Followup dedicated views of the wrist recommended to determine the age of this process. 2. Degenerative changes in the hand.   Electronically Signed   By: Shon Hale M.D.   On: 08/01/2013 23:50    EKG Interpretation   None      Date: 08/01/2013  Rate: 62  Rhythm: normal sinus rhythm  QRS Axis: normal  Intervals: normal  ST/T Wave abnormalities: normal  Conduction Disutrbances:none  Narrative Interpretation:   Old EKG Reviewed: unchanged    MDM   1. Altered mental status   2. Syncope   3. Hepatic encephalopathy    Patient history of significant cirrhosis often has elevated ammonia levels. Patient was fine 2 hours prior to being found on the floor. He was left at home.  Patient is awake is responsive but the spouse still feels that he seems to be confused in the brain. Workup for the altered mental status and progress to include CT head and neck as well as the complete labs and ammonia level. Normally patient is admitted sounds as if there was a syncopal. The patient will at least warrant adMission for that with cardiac monitoring.  Patient also has a lot of pain medications at home is not acting as if he is overmedicated on narcotics. Will however send off a Tylenol level to be on safe side. Although the medication he is listed is oxycodone without Tylenol.  Initial x-ray of the left hand raises some concern for  scaphoid fracture. No apparent tenderness in that area however will get dedicated wrist films to evaluate further if still abnormality is concerned that splinting would be appropriate.  Patient has been admitted to the hospitalist service MedSurg bed.   I personally performed the services described in this documentation, which was scribed in my presence. The recorded information has been reviewed and is accurate.      [name] Kung, MD 08/01/13 2417  [name] Kung, MD 08/02/13 [phone]

## 2013-08-02 ENCOUNTER — Inpatient Hospital Stay (HOSPITAL_COMMUNITY): Payer: Medicare Other

## 2013-08-02 ENCOUNTER — Encounter (HOSPITAL_COMMUNITY): Payer: Self-pay

## 2013-08-02 DIAGNOSIS — K7682 Hepatic encephalopathy: Secondary | ICD-10-CM | POA: Diagnosis present

## 2013-08-02 DIAGNOSIS — K7460 Unspecified cirrhosis of liver: Secondary | ICD-10-CM

## 2013-08-02 DIAGNOSIS — J449 Chronic obstructive pulmonary disease, unspecified: Secondary | ICD-10-CM

## 2013-08-02 DIAGNOSIS — K7200 Acute and subacute hepatic failure without coma: Secondary | ICD-10-CM | POA: Diagnosis present

## 2013-08-02 DIAGNOSIS — K7290 Hepatic failure, unspecified without coma: Principal | ICD-10-CM

## 2013-08-02 DIAGNOSIS — E119 Type 2 diabetes mellitus without complications: Secondary | ICD-10-CM

## 2013-08-02 DIAGNOSIS — E7220 Disorder of urea cycle metabolism, unspecified: Secondary | ICD-10-CM

## 2013-08-02 DIAGNOSIS — G40909 Epilepsy, unspecified, not intractable, without status epilepticus: Secondary | ICD-10-CM

## 2013-08-02 DIAGNOSIS — D61818 Other pancytopenia: Secondary | ICD-10-CM

## 2013-08-02 LAB — CBC
MCH: 28.3 pg (ref 26.0–34.0)
MCHC: 31.3 g/dL (ref 30.0–36.0)
MCV: 90.4 fL (ref 78.0–100.0)
RBC: 3.64 MIL/uL — ABNORMAL LOW (ref 4.22–5.81)
WBC: 2.8 10*3/uL — ABNORMAL LOW (ref 4.0–10.5)

## 2013-08-02 LAB — BASIC METABOLIC PANEL
BUN: 6 mg/dL (ref 6–23)
CO2: 29 mEq/L (ref 19–32)
Calcium: 8.5 mg/dL (ref 8.4–10.5)
Creatinine, Ser: 0.67 mg/dL (ref 0.50–1.35)
Glucose, Bld: 85 mg/dL (ref 70–99)
Sodium: 141 mEq/L (ref 135–145)

## 2013-08-02 LAB — GLUCOSE, CAPILLARY
Glucose-Capillary: 99 mg/dL (ref 70–99)
Glucose-Capillary: 85 mg/dL (ref 70–99)

## 2013-08-02 LAB — AMMONIA: Ammonia: 112 umol/L — ABNORMAL HIGH (ref 11–60)

## 2013-08-02 MED ORDER — INSULIN ASPART 100 UNIT/ML ~~LOC~~ SOLN: 0.0000 [IU] | Freq: Every day | SUBCUTANEOUS | Status: DC

## 2013-08-02 MED ORDER — SODIUM CHLORIDE 0.9 % IJ SOLN
3.0000 mL | Freq: Two times a day (BID) | INTRAMUSCULAR | Status: DC
  Administered 2013-08-02 – 2013-08-03 (×4): 3 mL via INTRAVENOUS

## 2013-08-02 MED ORDER — POTASSIUM CHLORIDE CRYS ER 20 MEQ PO TBCR
20.0000 meq | EXTENDED_RELEASE_TABLET | Freq: Every day | ORAL | Status: DC
  Administered 2013-08-02 – 2013-08-04 (×3): 20 meq via ORAL
  Filled 2013-08-02 (×3): qty 1

## 2013-08-02 MED ORDER — METOPROLOL TARTRATE 50 MG PO TABS
50.0000 mg | ORAL_TABLET | Freq: Every day | ORAL | Status: DC
  Administered 2013-08-02 – 2013-08-03 (×2): 50 mg via ORAL
  Filled 2013-08-02 (×2): qty 1

## 2013-08-02 MED ORDER — PANTOPRAZOLE SODIUM 40 MG PO TBEC
40.0000 mg | DELAYED_RELEASE_TABLET | Freq: Every day | ORAL | Status: DC
  Administered 2013-08-02 – 2013-08-04 (×3): 40 mg via ORAL
  Filled 2013-08-02 (×3): qty 1

## 2013-08-02 MED ORDER — LACTULOSE 10 GM/15ML PO SOLN
30.0000 g | Freq: Three times a day (TID) | ORAL | Status: DC
  Administered 2013-08-02 – 2013-08-03 (×4): 30 g via ORAL
  Filled 2013-08-02 (×4): qty 60

## 2013-08-02 MED ORDER — INSULIN ASPART 100 UNIT/ML ~~LOC~~ SOLN
0.0000 [IU] | Freq: Three times a day (TID) | SUBCUTANEOUS | Status: DC
  Administered 2013-08-03: 1 [IU] via SUBCUTANEOUS

## 2013-08-02 MED ORDER — SODIUM CHLORIDE 0.9 % IJ SOLN: 3.0000 mL | INTRAMUSCULAR | Status: DC | PRN

## 2013-08-02 MED ORDER — LEVETIRACETAM 500 MG PO TABS
500.0000 mg | ORAL_TABLET | Freq: Two times a day (BID) | ORAL | Status: DC
  Administered 2013-08-02 – 2013-08-04 (×5): 500 mg via ORAL
  Filled 2013-08-02 (×5): qty 1

## 2013-08-02 MED ORDER — BIOTENE DRY MOUTH MT LIQD: 15.0000 mL | Freq: Two times a day (BID) | OROMUCOSAL | Status: DC

## 2013-08-02 MED ORDER — SODIUM CHLORIDE 0.9 % IV SOLN: INTRAVENOUS | Status: DC

## 2013-08-02 MED ORDER — FUROSEMIDE 20 MG PO TABS
20.0000 mg | ORAL_TABLET | Freq: Every day | ORAL | Status: DC
  Administered 2013-08-02 – 2013-08-04 (×3): 20 mg via ORAL
  Filled 2013-08-02 (×3): qty 1

## 2013-08-02 MED ORDER — SODIUM CHLORIDE 0.9 % IV SOLN: 250.0000 mL | INTRAVENOUS | Status: DC | PRN

## 2013-08-02 MED ORDER — ASPIRIN EC 81 MG PO TBEC
81.0000 mg | DELAYED_RELEASE_TABLET | Freq: Every day | ORAL | Status: DC
  Administered 2013-08-02 – 2013-08-04 (×3): 81 mg via ORAL
  Filled 2013-08-02 (×3): qty 1

## 2013-08-02 MED ORDER — ADULT MULTIVITAMIN W/MINERALS CH
1.0000 | ORAL_TABLET | Freq: Every day | ORAL | Status: DC
  Administered 2013-08-02 – 2013-08-04 (×3): 1 via ORAL
  Filled 2013-08-02 (×3): qty 1

## 2013-08-02 MED ORDER — METOPROLOL TARTRATE 25 MG PO TABS
25.0000 mg | ORAL_TABLET | Freq: Every morning | ORAL | Status: DC
  Administered 2013-08-02 – 2013-08-04 (×3): 25 mg via ORAL
  Filled 2013-08-02 (×3): qty 1

## 2013-08-02 MED ORDER — GLIMEPIRIDE 2 MG PO TABS
2.0000 mg | ORAL_TABLET | Freq: Every day | ORAL | Status: DC
  Administered 2013-08-02: 2 mg via ORAL
  Filled 2013-08-02: qty 1

## 2013-08-02 MED ORDER — FLUOXETINE HCL 20 MG PO CAPS
20.0000 mg | ORAL_CAPSULE | Freq: Every day | ORAL | Status: DC
  Administered 2013-08-02 – 2013-08-04 (×3): 20 mg via ORAL
  Filled 2013-08-02 (×3): qty 1

## 2013-08-02 NOTE — ED Notes (Signed)
Report given to Helmut Muster, the receiving nurse.

## 2013-08-02 NOTE — Progress Notes (Signed)
UR chart review completed.  

## 2013-08-02 NOTE — Progress Notes (Signed)
[facility] PROGRESS NOTE  [name] [name] FHL:[id number] DOB: 10/29/1960 DOA: 08/01/2013 PCP: Carolee Rota, NP  Assessment/Plan: 1. Acute hepatic encephalopathy: Continue lactulose. Repeat ammonia in the morning. Hold Xanax, oxycodone. Possibly contributed to by misuse of oxycodone although there is no definite history.                         2. Possible fall prior to admission, questionable syncope: Will discuss with family, no compelling evidence at this point to suggest syncope. Most likely fall was secondary to encephalopathy. No evidence of bony injury to the head or neck.   3. Pancytopenia: Appears stable. Secondary to liver disease. 4. Displaced scaphoid waist fracture: Has a chronic appearance. Will discuss with patient. Followup with orthopedics. 5. Cirrhosis: Continue current medications, Lasix, lactulose 6. Diabetes mellitus: Start sliding scale insulin. 7. History of seizure disorder: Appears stable. No history or symptomatology to suggest seizure. Continue Keppra. 8. Bipolar 1 disorder 9. COPD  10. Depression 11. Mental retardation 12. Hard of hearing   Lactulose, repeat ammonia in the morning.  Start sliding-scale insulin  Pending studies:   none  Code Status: full code DVT prophylaxis: SCDs, platelets 67 Family Communication: none present Disposition Plan: home when improved  [name], MD  [facility]  Pager [phone] If 7PM-7AM, please contact night-coverage at [email or web address], password [facility] 08/02/2013, 12:00 PM  LOS: 1 day   Summary: 52 year old man with history of cirrhosis, chronic pain and multiple other issues presented with acute encephalopathy, possible fall or syncope and was admitted for further evaluation.  Consultants:    Procedures:    Antibiotics:    HPI/Subjective: Patient denies significant complaints at this point. Noted by nursing to be quite somnolent this morning.  Objective: Filed Vitals:   08/01/13  2300 08/02/13 0000 08/02/13 0128 08/02/13 0530  BP: 153/98 147/78 135/74 114/69  Pulse:  63 58 65  Temp:   98.1 F (36.7 C) 98.3 F (36.8 C)  TempSrc:   Oral Oral  Resp: 10 10 16 16   Weight:   93.7 kg (206 lb 9.1 oz)   SpO2:   98% 96%   No intake or output data in the 24 hours ending 08/02/13 1200   Filed Weights   08/02/13 0128  Weight: 93.7 kg (206 lb 9.1 oz)    Exam:   Afebrile, vital signs stable  Eyes: Right iris scar consistent with known history of blindness. Left eye, pupil, iris appears unremarkable.   General: Appears calm, comfortable. Somnolent but arouses to voice and answers simple questions. Nontoxic.   cardiovascular: Regular rate and rhythm. No murmur, rub gallop.no lower extremity edema.  Respiratory: Clear to auscultation bilaterally. No wheezes, rales or rhonchi. Normal respiratory effort.  Abdomen: Soft, nontender, nondistended.  Skin: No rash or induration seen.   Data Reviewed:  basic metabolic panel unremarkable.  Ammonia 104 >> 112  Pancytopenia without significant change   chest x-ray unremarkable. CT of the head and neck unremarkable. Hip films negative.  Displaced scaphoid waist fracture appears to be chronic   EKG sinus rhythm, no acute changes   Scheduled Meds: . aspirin EC  81 mg Oral Daily  . FLUoxetine  20 mg Oral Daily  . furosemide  20 mg Oral Daily  . glimepiride  2 mg Oral QAC breakfast  . lactulose  30 g Oral TID  . levETIRAcetam  500 mg Oral BID  . metoprolol tartrate  50 mg Oral QHS  . metoprolol tartrate  25 mg Oral q morning - 10a  . multivitamin with minerals  1 tablet Oral Daily  . potassium chloride SA  20 mEq Oral Daily  . sodium chloride  3 mL Intravenous Q12H   Continuous Infusions:   Principal Problem:   Hepatic encephalopathy Active Problems:   COPD (chronic obstructive pulmonary disease)   Cirrhosis   Seizure disorder   Anemia   Pancytopenia   Acute hepatic encephalopathy   Time spent 25  minutes

## 2013-08-02 NOTE — H&P (Signed)
PCP:   DANIEL,JANICE, NP   Chief Complaint:  Found down confused  HPI: 52 yo male h/o cirrhosis secondary to nash, htn, hard of hearing, copd, sz disorder wife went to play bingo came back and found him down on ground confused.  Typical for when his ammonia level goes up.  pts mental status some improved in ED, no bm since arrival.  Pt states he takes his laculose at home, but is still some confused.  Wife has left to go home.  History unreliable at this time from pt.  Review of Systems:  Positive and negative as per HPI otherwise all other systems are negative but unreliable  Past Medical History: Past Medical History  Diagnosis Date  . Depression   . 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 Bipolar 1 disorder   . Hypertension   . Diabetes mellitus   . Seizures   . COPD (chronic obstructive pulmonary disease)   . Mental retardation   . Blind right eye   . GERD (gastroesophageal reflux disease)   . Chronic back pain   . Hepatic encephalopathy   . Anemia   . Thrombocytopenia   . Cardiomegaly   . Anxiety   . Arthritis   . Cholelithiasis     Asymptomatic  . Cerebrovascular disease     Right vertebral artery  . Hard of hearing   . Hyperlipidemia   . Cataract     Right eye  . Blood clots in brain     per patient  . Cirrhosis     diagnosed 07/2010 when presented with first episode of hepatic encephalopathy, afp on 416/13=4.8,  U/S on 12/14/11  liver stable, pt has received 2 hep A/B vaccines  . Diverticulosis    Past Surgical History  Procedure Laterality Date  . Tonsillectomy    . Ear operations    . Cast application  04/30/1190    Procedure: MINOR CAST APPLICATION;  Surgeon: [name], MD;  Location: [facility];  Service: Orthopedics;  Laterality: Right;  no anesthesia , procedure room do not need or room !  . Orif ankle fracture  08/27/2011    Procedure: OPEN REDUCTION INTERNAL FIXATION (ORIF) ANKLE FRACTURE;  Surgeon: [name], MD;  Location: [facility];  Service:  Orthopedics;  Laterality: Right;  . Esophagogastroduodenoscopy  02/02/2011    Rourk-Normal esophagus.  No varices/Question mild portal gastropathy, extrinsic compression on the antrum, lesser curvature of uncertain significance, some minimally  nodular mucosa status post biopsy, patent pylorus, normal duodenum 1 and duodenum 2.  . Colonoscopy  12/15/11    colonic divericulosis;poor prep, ACBE  recommended but has not been done  . Esophagogastroduodenoscopy  12/15/11    Rourk-->mild changes of portal gastropathy, gastric/duodenal erosions s/p bx  (reactive changes with mild chronic inflammation. No H.pylori  . Foot surgery      Medications: Prior to Admission medications   Medication Sig Start Date End Date Taking? Authorizing Provider  ALPRAZolam Duanne Moron) 0.5 MG tablet Take 0.5 mg by mouth 3 (three) times daily as needed for anxiety.   Yes Historical Provider, MD  aspirin EC 81 MG tablet Take 81 mg by mouth daily.   Yes Historical Provider, MD  Cholecalciferol (VITAMIN D) 2000 UNITS tablet Take 1 tablet (2,000 Units total) by mouth daily. 09/20/12  Yes Nimish Luther Parody, MD  fish oil-omega-3 fatty acids 1000 MG capsule Take 1 g by mouth 3 (three) times daily.   Yes Historical Provider, MD  FLUoxetine (PROZAC) 20 MG  capsule Take 1 capsule (20 mg total) by mouth daily. 09/20/12  Yes Nimish Luther Parody, MD  furosemide (LASIX) 20 MG tablet Take 20 mg by mouth daily.   Yes Historical Provider, MD  glimepiride (AMARYL) 2 MG tablet Take 2 mg by mouth daily before breakfast.   Yes Historical Provider, MD  lactulose (CHRONULAC) 10 GM/15ML solution Take 60 mLs (40 g total) by mouth 3 (three) times daily. Titrate for 3-4 bowel motions daily. 02/02/13  Yes Nimish Luther Parody, MD  levETIRAcetam (KEPPRA) 500 MG tablet Take 500 mg by mouth 2 (two) times daily.   Yes Historical Provider, MD  lidocaine (XYLOCAINE) 2 % jelly Apply topically as needed. 05/18/13  Yes Lezlie Octave Black, NP  metoprolol tartrate (LOPRESSOR) 25 MG  tablet Take 25-50 mg by mouth 2 (two) times daily. Take 25 mg in the morning and 50 mg at night   Yes Historical Provider, MD  Multiple Vitamin (MULTIVITAMIN WITH MINERALS) TABS Take 1 tablet by mouth daily.   Yes Historical Provider, MD  omeprazole (PRILOSEC) 20 MG capsule Take 1 capsule (20 mg total) by mouth daily. 09/20/12  Yes Nimish Luther Parody, MD  Oxycodone HCl 10 MG TABS Take 10 mg by mouth every 6 (six) hours as needed (for pain).   Yes Historical Provider, MD  potassium chloride SA (K-DUR,KLOR-CON) 20 MEQ tablet Take 1 tablet (20 mEq total) by mouth daily. 09/20/12  Yes Doree Albee, MD    Allergies:   Allergies  Allergen Reactions  . Penicillins Rash    Social History:  reports that he has been smoking Cigarettes.  He has a 30 pack-year smoking history. He does not have any smokeless tobacco history on file. He reports that he does not drink alcohol or use illicit drugs.  Family History: Family History  Problem Relation Age of Onset  . Heart failure Mother   . Thyroid disease Mother   . Cancer Father   . Asthma Brother   . Heart attack Brother   . Cirrhosis Brother     EtOH  . Colon cancer Maternal Aunt     Physical Exam: Filed Vitals:   08/01/13 2136 08/01/13 2200 08/01/13 2300 08/02/13 0000  BP: 168/85 148/72 153/98 147/78  Pulse: 62 61  63  Temp: 98.9 F (37.2 C)     TempSrc: Oral     Resp: 12 23 10 10   SpO2: 98%      General appearance: cooperative, no distress and slowed mentation Head: Normocephalic, without obvious abnormality, atraumatic Eyes: negative Nose: Nares normal. Septum midline. Mucosa normal. No drainage or sinus tenderness. Neck: no JVD and supple, symmetrical, trachea midline Lungs: clear to auscultation bilaterally Heart: regular rate and rhythm, S1, S2 normal, no murmur, click, rub or gallop Abdomen: soft, non-tender; bowel sounds normal; no masses,  no organomegaly Extremities: extremities normal, atraumatic, no cyanosis or  edema Pulses: 2+ and symmetric Skin: Skin color, texture, turgor normal. No rashes or lesions Neurologic: Cranial nerves: normal    Labs on Admission:   Recent Labs  08/01/13 2155  NA 136  K 4.4  CL 100  CO2 29  GLUCOSE 100*  BUN 8  CREATININE 0.74  CALCIUM 8.6    Recent Labs  08/01/13 2155  AST 98*  ALT 27  ALKPHOS 109  BILITOT 1.6*  PROT 6.2  ALBUMIN 2.8*    Recent Labs  08/01/13 2155  LIPASE 42    Recent Labs  08/01/13 2155  WBC 2.5*  NEUTROABS 1.1*  HGB 10.3*  HCT 33.4*  MCV 89.8  PLT 62*    Recent Labs  08/01/13 2155  TROPONINI <0.30   Radiological Exams on Admission: Dg Hip Bilateral W/pelvis  08/01/2013   CLINICAL DATA:  Found face down; concern for pelvic injury.  EXAM: BILATERAL HIP WITH PELVIS - 4+ VIEW  COMPARISON:  CT of the abdomen and pelvis performed 02/02/2011, and bilateral hip radiographs performed 10/05/2010  FINDINGS: There is no evidence of fracture or dislocation. Both femoral heads are seated normally within their respective acetabula. No significant degenerative change is appreciated. The sacroiliac joints are unremarkable in appearance.  The visualized bowel gas pattern is grossly unremarkable in appearance.  IMPRESSION: No evidence of fracture or dislocation.   Electronically Signed   By: Garald Balding M.D.   On: 08/01/2013 23:29   Dg Ankle Complete Right  08/01/2013   CLINICAL DATA:  Found on floor; concern for right ankle injury. History of right ankle fracture.  EXAM: RIGHT ANKLE - COMPLETE 3+ VIEW  COMPARISON:  None.  FINDINGS: There is no definite evidence of acute fracture or dislocation. The plate along the distal fibula is unchanged in appearance. There is chronic deformity involving the distal tibia and fibula, with chronic talar tilt and surrounding degenerative change. There is diffuse narrowing of the ankle mortise, reflecting degenerative change. Mild chronic lateral soft tissue swelling is noted.  IMPRESSION: No  definite evidence of acute fracture or dislocation. Chronic deformity involving the right ankle.   Electronically Signed   By: Garald Balding M.D.   On: 08/01/2013 23:36   Ct Head Wo Contrast  08/01/2013   CLINICAL DATA:  Unresponsive, found down  EXAM: CT HEAD WITHOUT CONTRAST  CT CERVICAL SPINE WITHOUT CONTRAST  TECHNIQUE: Multidetector CT imaging of the head and cervical spine was performed following the standard protocol without intravenous contrast. Multiplanar CT image reconstructions of the cervical spine were also generated.  COMPARISON:  Prior CT from 04/17/2013  FINDINGS: CT HEAD FINDINGS  There is no acute intracranial hemorrhage or infarct. No mass lesion or midline shift. Gray-white matter differentiation is well maintained. Ventricles are normal in size without evidence of hydrocephalus. CSF containing spaces are within normal limits. No extra-axial fluid collection. Tiny remote lacunar infarct within the left basal ganglia again noted, unchanged.  The calvarium is intact.  Heterogeneous calcification within the right globe again noted. Sequelae of prior canal wall down mastoidectomies are also seen bilaterally, also unchanged.  The paranasal sinuses are clear.  Scalp soft tissues are unremarkable.  CT CERVICAL SPINE FINDINGS  Vertebral bodies are normally aligned with preservation of the normal cervical lordosis. Prominent degenerative Schmorl's nodes are seen at the inferior endplate of C4 and C6 as well as the superior endplate of C7. No acute fracture or listhesis. No prevertebral soft tissue swelling. Normal C1-2 articulations are preserved.  Visualized soft tissues of the neck are within normal limits. No apical pneumothorax.  IMPRESSION: 1. Stable CT of the head with no acute intracranial process identified. 2. No acute traumatic injury within the cervical spine. 3. Moderate degenerative disc disease at C4-5 and C6-7   Electronically Signed   By: Jeannine Boga M.D.   On: 08/01/2013  23:10   Ct Cervical Spine Wo Contrast  08/01/2013   CLINICAL DATA:  Unresponsive, found down  EXAM: CT HEAD WITHOUT CONTRAST  CT CERVICAL SPINE WITHOUT CONTRAST  TECHNIQUE: Multidetector CT imaging of the head and cervical spine was performed following the standard protocol without intravenous contrast.  Multiplanar CT image reconstructions of the cervical spine were also generated.  COMPARISON:  Prior CT from 04/17/2013  FINDINGS: CT HEAD FINDINGS  There is no acute intracranial hemorrhage or infarct. No mass lesion or midline shift. Gray-white matter differentiation is well maintained. Ventricles are normal in size without evidence of hydrocephalus. CSF containing spaces are within normal limits. No extra-axial fluid collection. Tiny remote lacunar infarct within the left basal ganglia again noted, unchanged.  The calvarium is intact.  Heterogeneous calcification within the right globe again noted. Sequelae of prior canal wall down mastoidectomies are also seen bilaterally, also unchanged.  The paranasal sinuses are clear.  Scalp soft tissues are unremarkable.  CT CERVICAL SPINE FINDINGS  Vertebral bodies are normally aligned with preservation of the normal cervical lordosis. Prominent degenerative Schmorl's nodes are seen at the inferior endplate of C4 and C6 as well as the superior endplate of C7. No acute fracture or listhesis. No prevertebral soft tissue swelling. Normal C1-2 articulations are preserved.  Visualized soft tissues of the neck are within normal limits. No apical pneumothorax.  IMPRESSION: 1. Stable CT of the head with no acute intracranial process identified. 2. No acute traumatic injury within the cervical spine. 3. Moderate degenerative disc disease at C4-5 and C6-7   Electronically Signed   By: Jeannine Boga M.D.   On: 08/01/2013 23:10   Dg Chest Port 1 View  08/01/2013   CLINICAL DATA:  Status post fall; concern for chest injury.  EXAM: PORTABLE CHEST - 1 VIEW  COMPARISON:   Chest radiograph performed 05/16/2013  FINDINGS: The lungs are well-aerated. Mild vascular congestion is noted, without definite pulmonary edema. There is no evidence of focal opacification, pleural effusion or pneumothorax.  The cardiomediastinal silhouette is borderline enlarged. No acute osseous abnormalities are seen.  IMPRESSION: Mild vascular congestion, without definite pulmonary edema. Borderline cardiomegaly. No displaced rib fracture seen.   Electronically Signed   By: Garald Balding M.D.   On: 08/01/2013 22:29   Dg Hand Complete Left  08/01/2013   CLINICAL DATA:  The patient was found on the floor.  Ankle fracture.  EXAM: LEFT HAND - COMPLETE 3+ VIEW  COMPARISON:  None.  FINDINGS: There is a fracture of the scaphoid. Degenerative changes are identified in the distal interphalangeal joints in the 1st carpometacarpal joint. No radiopaque foreign body or soft tissue gas.  IMPRESSION: 1. Scaphoid fracture. Followup dedicated views of the wrist recommended to determine the age of this process. 2. Degenerative changes in the hand.   Electronically Signed   By: Shon Hale M.D.   On: 08/01/2013 23:50    Assessment/Plan  52 yo male with hepatic encephalopathy  Principal Problem:   Hepatic encephalopathy-  Place on lactulose tid.  Unknown if compliance an issue.  freq neuro cks overnight.  No significant ascites on exam.  Adv diet.  Cont home diuretic regimen.  No ivf at this time, unless does not have good po intake.  Active Problems:   COPD (chronic obstructive pulmonary disease)   Cirrhosis   Seizure disorder   Anemia   Pancytopenia    Dezeray Puccio A 08/02/2013, 12:52 AM

## 2013-08-03 DIAGNOSIS — D649 Anemia, unspecified: Secondary | ICD-10-CM

## 2013-08-03 LAB — GLUCOSE, CAPILLARY
Glucose-Capillary: 97 mg/dL (ref 70–99)
Glucose-Capillary: 97 mg/dL (ref 70–99)
Glucose-Capillary: 100 mg/dL — ABNORMAL HIGH (ref 70–99)

## 2013-08-03 LAB — BASIC METABOLIC PANEL
CO2: 28 mEq/L (ref 19–32)
Calcium: 8.4 mg/dL (ref 8.4–10.5)
Creatinine, Ser: 0.81 mg/dL (ref 0.50–1.35)
GFR calc non Af Amer: >90 mL/min (ref >90)
Glucose, Bld: 89 mg/dL (ref 70–99)
Potassium: 4.1 mEq/L (ref 3.5–5.1)
Sodium: 140 mEq/L (ref 135–145)

## 2013-08-03 LAB — CBC
HCT: 31.6 % — ABNORMAL LOW (ref 39.0–52.0)
Hemoglobin: 9.8 g/dL — ABNORMAL LOW (ref 13.0–17.0)
MCH: 28.2 pg (ref 26.0–34.0)
Platelets: 62 10*3/uL — ABNORMAL LOW (ref 150–400)
RBC: 3.48 MIL/uL — ABNORMAL LOW (ref 4.22–5.81)
WBC: 3.1 10*3/uL — ABNORMAL LOW (ref 4.0–10.5)

## 2013-08-03 LAB — AMMONIA: Ammonia: 101 umol/L — ABNORMAL HIGH (ref 11–60)

## 2013-08-03 MED ORDER — LACTULOSE 10 GM/15ML PO SOLN
30.0000 g | Freq: Four times a day (QID) | ORAL | Status: DC
  Administered 2013-08-03 – 2013-08-04 (×4): 30 g via ORAL
  Filled 2013-08-03 (×5): qty 60

## 2013-08-03 NOTE — Progress Notes (Signed)
[facility] PROGRESS NOTE  [name] [name] OHY:[id number] DOB: [birth year] DOA: 08/01/2013 PCP: Carolee Rota, NP  Assessment/Plan: 1. Acute hepatic encephalopathy: Continue lactulose. Hold Xanax and pain medication.                 2. Possible fall prior to admission, questionable syncope: No compelling evidence at this point to suggest syncope. Most likely fall was secondary to encephalopathy. No evidence of bony injury to the head or neck.   3. Pancytopenia: Stable. Secondary to liver disease. 4. Displaced scaphoid waist fracture: chronic appearance on x-ray. Patient denies pain. No gross abnormalities of the wrist. This is chronic per wife. 5. Cirrhosis: Continue current medications, Lasix, lactulose 6. Diabetes mellitus: Stable and sliding scale insulin. 7. History of seizure disorder: Stable. No history or symptomatology to suggest seizure. Continue Keppra. 8. Bipolar 1 disorder 9. COPD  10. Depression 11. Mental retardation 12. Hard of hearing   Overall improving. Continue lactulose. Repeat ammonia level in the morning.  Updated wife by telephone, discussed anticipate discharge, she agreed  Anticipate discharge 12/13  Pending studies:   none  Code Status: full code DVT prophylaxis: SCDs, platelets 67 Family Communication: none present Disposition Plan: home when improved  [name] Hodgkins, MD  [facility]  Pager [phone] If 7PM-7AM, please contact night-coverage at [email or web address], password [facility] 08/03/2013, 10:49 AM  LOS: 2 days   Summary: 52 year old man with history of cirrhosis, chronic pain and multiple other issues presented with acute encephalopathy, possible fall or syncope and was admitted for further evaluation.  Consultants:    Procedures:    Antibiotics:    HPI/Subjective: No pain, no left wrist pain, no complaints.  Objective: Filed Vitals:   08/02/13 1352 08/02/13 2130 08/03/13 0443 08/03/13 0943  BP: 112/64 112/70 120/73  146/84  Pulse: 59 64 71 69  Temp: 97.9 F (36.6 C) 98.6 F (37 C) 98.5 F (36.9 C)   TempSrc: Oral Oral Oral   Resp: 18 16 18    Weight:      SpO2: 93% 96% 95%     Intake/Output Summary (Last 24 hours) at 08/03/13 1049 Last data filed at 08/03/13 0442  Gross per 24 hour  Intake    240 ml  Output    800 ml  Net   -560 ml     Filed Weights   08/02/13 0128  Weight: 93.7 kg (206 lb 9.1 oz)    Exam:   Afebrile, vital signs stable  General: Appears calm and comfortable.  Psychiatric: Grossly normal mood and affect. Alerts easily to voice. Follows commands with all extremities. Oriented to self, "[name]", 2014 general: Appears calm and comfortable. Speech fluent and clear.  Cardiovascular: Regular rate and rhythm. No murmur, rub or gallop.  Telemetry: No events  Respiratory: Clear to auscultation bilaterally. No wheezes, rales or rhonchi. Normal respiratory effort.  Musculoskeletal: Grossly normal tone and strength all extremities. Moves all extremities to command. No left wrist pain or swelling.  Data Reviewed:  basic metabolic panel unremarkable.  Ammonia 104 >> 112 >> 101  Hemoglobin, platelet count stable  Scheduled Meds: . aspirin EC  81 mg Oral Daily  . FLUoxetine  20 mg Oral Daily  . furosemide  20 mg Oral Daily  . insulin aspart  0-5 Units Subcutaneous QHS  . insulin aspart  0-9 Units Subcutaneous TID WC  . lactulose  30 g Oral TID  . levETIRAcetam  500 mg Oral BID  . metoprolol tartrate  50 mg Oral QHS  .  metoprolol tartrate  25 mg Oral q morning - 10a  . multivitamin with minerals  1 tablet Oral Daily  . pantoprazole  40 mg Oral Daily  . potassium chloride SA  20 mEq Oral Daily  . sodium chloride  3 mL Intravenous Q12H   Continuous Infusions:   Principal Problem:   Hepatic encephalopathy Active Problems:   COPD (chronic obstructive pulmonary disease)   Cirrhosis   Seizure disorder   Anemia   Pancytopenia   Acute hepatic  encephalopathy   Time spent 20 minutes

## 2013-08-03 NOTE — Care Management Note (Signed)
    Page 1 of 2   08/03/2013     12:31:20 PM   CARE MANAGEMENT NOTE 08/03/2013  Patient:  [name] [name], [name] [name]   Account Number:  [id number]  Date Initiated:  08/02/2013  Documentation initiated by:  Sharrie Rothman  Subjective/Objective Assessment:   Pt admitted from home with hepatic encephalopathy. Pt lives with wife. Pt lethargic at present and unable to converse with CM.     Action/Plan:   Will attempt to call wife to discuss discharge plans.   Anticipated DC Date:  08/06/2013   Anticipated DC Plan:  HOME W HOME HEALTH SERVICES      DC Planning Services  CM consult      [facility] Choice  HOME HEALTH   Choice offered to / List presented to:  C-3 Spouse        HH arranged  HH-1 RN  HH-2 PT      HH agency  [facility]   Status of service:  Completed, signed off Medicare Important Message given?  YES (If response is "NO", the following Medicare IM given date fields will be blank) Date Medicare IM given:  08/03/2013 Date Additional Medicare IM given:    Discharge Disposition:  HOME W HOME HEALTH SERVICES  Per UR Regulation:    If discussed at Long Length of Stay Meetings, dates discussed:    Comments:  08/03/13 1145 Arlyss Queen, RN BSN CM Cm spoke with pts wife about HH at discharge. HH RN and PT arranged with [facility]. Alroy Bailiff of [facility] is aware and will collect the pts information from the chart. Hh services to start wthin 48 hours of discharge. Pt has walker for home use. Pt wife aware of discharge arrangements. 08/02/13 1040 Arlyss Queen, RN BSN CM

## 2013-08-04 LAB — AMMONIA: Ammonia: 57 umol/L (ref 11–60)

## 2013-08-04 LAB — GLUCOSE, CAPILLARY: Glucose-Capillary: 99 mg/dL (ref 70–99)

## 2013-08-04 MED ORDER — OXYCODONE HCL 5 MG PO TABS
5.0000 mg | ORAL_TABLET | Freq: Four times a day (QID) | ORAL | Status: DC | PRN
  Administered 2013-08-04: 5 mg via ORAL
  Filled 2013-08-04: qty 1

## 2013-08-04 NOTE — Progress Notes (Signed)
Pt is to be discharged home today. Pt is in NAD, IV is out, all paperwork has been reviewed/discussed with patient, and there are no questions/concerns at this time. Assessment is unchanged from this morning. Pt is to be accompanied downstairs by staff and family via wheelchair.  

## 2013-08-04 NOTE — Discharge Summary (Signed)
Physician Discharge Summary  [name] [name] JSH:[id number] DOB: September 06, 1960 DOA: 08/01/2013  PCP: [name] Rota, NP  Admit date: 08/01/2013 Discharge date: 08/04/2013  Recommendations for Outpatient Follow-up:  1. Followup acute hepatic encephalopathy 2. Continued care of chronic comorbidities including cirrhosis 3. Consider weaning Xanax if possible.  Follow-up Information   Follow up with [name] [name].   Contact information:   [address] [place] [address] [phone]      Follow up with DANIEL,JANICE, NP In 1 week.   Specialty:  Nurse Practitioner   Contact information:   [address] [place] [address] [phone]      Discharge Diagnoses:  1. Acute hepatic encephalopathy 2. Possible fall prior to admission, no evidence of syncope 3. Chronic pancytopenia secondary to cirrhosis 4. Cirrhosis 5. Diabetes mellitus type 2 6. History of seizure disorder  Discharge Condition: Improved Disposition: Home  Diet recommendation: diabetic   Filed Weights   08/02/13 0128  Weight: 93.7 kg (206 lb 9.1 oz)   History of present illness:  51 year old man with history of cirrhosis, chronic pain and multiple other issues presented with acute encephalopathy, possible fall or syncope and was admitted for further evaluation.   Hospital Course:  [name] was treated for acute hepatic encephalopathy as suggested bilateral current findings and clinical history. He rapidly improved with increased dose of life she was is now stable for discharge. His hospitalization was uncomplicated. It is possible Xanax or pain medication contributed. See individual issues below.  1. Acute hepatic encephalopathy: Resolved with increased flatulence. 2. Possible fall prior to admission, questionable syncope: No compelling evidence at this point to suggest syncope. Most likely fall was secondary to encephalopathy. No evidence of bony injury to the head or neck. Telemetry sinus  rhythm. 3. Pancytopenia: Stable. Secondary to liver disease. 4. Displaced scaphoid waist fracture: chronic appearance on x-ray. Patient denies pain. No gross abnormalities of the wrist. This is chronic per wife. 5. Cirrhosis: Continue current medications, Lasix, lactulose 6. Diabetes mellitus: Stable on sliding scale insulin. 7. History of seizure disorder: Stable. No history or symptomatology to suggest seizure. Continue Keppra. 8. Bipolar 1 disorder 9. COPD  10. Depression 11. Mental retardation 12. Hard of hearing  Consultants:  none Procedures:  none Antibiotics:  none  Discharge Instructions  Discharge Orders   Future Orders Complete By Expires   Diet - low sodium heart healthy  As directed    Discharge instructions  As directed    Comments:     Call physician or seek immediate medical attention for confusion, lethargy or worsening of condition.   Walk with assistance  As directed        Medication List         ALPRAZolam 0.5 MG tablet  Commonly known as:  XANAX  Take 0.5 mg by mouth 3 (three) times daily as needed for anxiety.     aspirin EC 81 MG tablet  Take 81 mg by mouth daily.     fish oil-omega-3 fatty acids 1000 MG capsule  Take 1 g by mouth 3 (three) times daily.     FLUoxetine 20 MG capsule  Commonly known as:  PROZAC  Take 1 capsule (20 mg total) by mouth daily.     furosemide 20 MG tablet  Commonly known as:  LASIX  Take 20 mg by mouth daily.     glimepiride 2 MG tablet  Commonly known as:  AMARYL  Take 2 mg by mouth daily before breakfast.  lactulose 10 GM/15ML solution  Commonly known as:  CHRONULAC  Take 60 mLs (40 g total) by mouth 3 (three) times daily. Titrate for 3-4 bowel motions daily.     levETIRAcetam 500 MG tablet  Commonly known as:  KEPPRA  Take 500 mg by mouth 2 (two) times daily.     lidocaine 2 % jelly  Commonly known as:  XYLOCAINE  Apply topically as needed.     metoprolol tartrate 25 MG tablet  Commonly known  as:  LOPRESSOR  Take 25-50 mg by mouth 2 (two) times daily. Take 25 mg in the morning and 50 mg at night     multivitamin with minerals Tabs tablet  Take 1 tablet by mouth daily.     omeprazole 20 MG capsule  Commonly known as:  PRILOSEC  Take 1 capsule (20 mg total) by mouth daily.     Oxycodone HCl 10 MG Tabs  Take 10 mg by mouth every 6 (six) hours as needed (for pain).     potassium chloride SA 20 MEQ tablet  Commonly known as:  K-DUR,KLOR-CON  Take 1 tablet (20 mEq total) by mouth daily.     Vitamin D 2000 UNITS tablet  Take 1 tablet (2,000 Units total) by mouth daily.       Allergies  Allergen Reactions  . Penicillins Rash    The results of significant diagnostics from this hospitalization (including imaging, microbiology, ancillary and laboratory) are listed below for reference.    Significant Diagnostic Studies: Dg Wrist Complete Left  2013/08/31   CLINICAL DATA:  Follow-up scaphoid fracture.  EXAM: LEFT WRIST - COMPLETE 3+ VIEW  COMPARISON:  Left hand radiographs performed 08/01/2013  FINDINGS: The displaced scaphoid fracture has a somewhat more chronic appearance, given apparent sclerosis along the proximal edge of the distal pole. Would suggest clinical correlation for associated symptoms. No additional fractures are seen. Visualized joint spaces are grossly preserved. No significant soft tissue abnormalities are characterized on radiograph.  IMPRESSION: Displaced scaphoid waist fracture has a somewhat more chronic appearance, given apparent sclerosis along the proximal edge of the distal scaphoid pole. Would suggest clinical correlation for associated symptoms.   Electronically Signed   By: Garald Balding M.D.   On: 08/31/2013 00:51   Dg Hip Bilateral W/pelvis  08/01/2013   CLINICAL DATA:  Found face down; concern for pelvic injury.  EXAM: BILATERAL HIP WITH PELVIS - 4+ VIEW  COMPARISON:  CT of the abdomen and pelvis performed 02/02/2011, and bilateral hip radiographs  performed 10/05/2010  FINDINGS: There is no evidence of fracture or dislocation. Both femoral heads are seated normally within their respective acetabula. No significant degenerative change is appreciated. The sacroiliac joints are unremarkable in appearance.  The visualized bowel gas pattern is grossly unremarkable in appearance.  IMPRESSION: No evidence of fracture or dislocation.   Electronically Signed   By: Garald Balding M.D.   On: 08/01/2013 23:29   Dg Ankle Complete Right  08/01/2013   CLINICAL DATA:  Found on floor; concern for right ankle injury. History of right ankle fracture.  EXAM: RIGHT ANKLE - COMPLETE 3+ VIEW  COMPARISON:  None.  FINDINGS: There is no definite evidence of acute fracture or dislocation. The plate along the distal fibula is unchanged in appearance. There is chronic deformity involving the distal tibia and fibula, with chronic talar tilt and surrounding degenerative change. There is diffuse narrowing of the ankle mortise, reflecting degenerative change. Mild chronic lateral soft tissue swelling is noted.  IMPRESSION:  No definite evidence of acute fracture or dislocation. Chronic deformity involving the right ankle.   Electronically Signed   By: Garald Balding M.D.   On: 08/01/2013 23:36   Ct Head Wo Contrast  08/01/2013   CLINICAL DATA:  Unresponsive, found down  EXAM: CT HEAD WITHOUT CONTRAST  CT CERVICAL SPINE WITHOUT CONTRAST  TECHNIQUE: Multidetector CT imaging of the head and cervical spine was performed following the standard protocol without intravenous contrast. Multiplanar CT image reconstructions of the cervical spine were also generated.  COMPARISON:  Prior CT from 04/17/2013  FINDINGS: CT HEAD FINDINGS  There is no acute intracranial hemorrhage or infarct. No mass lesion or midline shift. Gray-white matter differentiation is well maintained. Ventricles are normal in size without evidence of hydrocephalus. CSF containing spaces are within normal limits. No  extra-axial fluid collection. Tiny remote lacunar infarct within the left basal ganglia again noted, unchanged.  The calvarium is intact.  Heterogeneous calcification within the right globe again noted. Sequelae of prior canal wall down mastoidectomies are also seen bilaterally, also unchanged.  The paranasal sinuses are clear.  Scalp soft tissues are unremarkable.  CT CERVICAL SPINE FINDINGS  Vertebral bodies are normally aligned with preservation of the normal cervical lordosis. Prominent degenerative Schmorl's nodes are seen at the inferior endplate of C4 and C6 as well as the superior endplate of C7. No acute fracture or listhesis. No prevertebral soft tissue swelling. Normal C1-2 articulations are preserved.  Visualized soft tissues of the neck are within normal limits. No apical pneumothorax.  IMPRESSION: 1. Stable CT of the head with no acute intracranial process identified. 2. No acute traumatic injury within the cervical spine. 3. Moderate degenerative disc disease at C4-5 and C6-7   Electronically Signed   By: Jeannine Boga M.D.   On: 08/01/2013 23:10   Dg Chest Port 1 View  08/01/2013   CLINICAL DATA:  Status post fall; concern for chest injury.  EXAM: PORTABLE CHEST - 1 VIEW  COMPARISON:  Chest radiograph performed 05/16/2013  FINDINGS: The lungs are well-aerated. Mild vascular congestion is noted, without definite pulmonary edema. There is no evidence of focal opacification, pleural effusion or pneumothorax.  The cardiomediastinal silhouette is borderline enlarged. No acute osseous abnormalities are seen.  IMPRESSION: Mild vascular congestion, without definite pulmonary edema. Borderline cardiomegaly. No displaced rib fracture seen.   Electronically Signed   By: Garald Balding M.D.   On: 08/01/2013 22:29   Dg Hand Complete Left  08/01/2013   CLINICAL DATA:  The patient was found on the floor.  Ankle fracture.  EXAM: LEFT HAND - COMPLETE 3+ VIEW  COMPARISON:  None.  FINDINGS: There is a  fracture of the scaphoid. Degenerative changes are identified in the distal interphalangeal joints in the 1st carpometacarpal joint. No radiopaque foreign body or soft tissue gas.  IMPRESSION: 1. Scaphoid fracture. Followup dedicated views of the wrist recommended to determine the age of this process. 2. Degenerative changes in the hand.   Electronically Signed   By: Shon Hale M.D.   On: 08/01/2013 23:50   Labs: Basic Metabolic Panel:  Recent Labs Lab 08/01/13 2155 08/02/13 0459 08/03/13 0504  NA 136 141 140  K 4.4 4.4 4.1  CL 100 105 105  CO2 29 29 28   GLUCOSE 100* 85 89  BUN 8 6 8   CREATININE 0.74 0.67 0.81  CALCIUM 8.6 8.5 8.4   Liver Function Tests:  Recent Labs Lab 08/01/13 2155  AST 98*  ALT 27  ALKPHOS  109  BILITOT 1.6*  PROT 6.2  ALBUMIN 2.8*    Recent Labs Lab 08/01/13 2155  LIPASE 42    Recent Labs Lab 08/01/13 2156 08/02/13 0459 08/03/13 0504 08/04/13 0822  AMMONIA 104* 112* 101* 57   CBC:  Recent Labs Lab 08/01/13 2155 08/02/13 0459 08/03/13 0504  WBC 2.5* 2.8* 3.1*  NEUTROABS 1.1*  --   --   HGB 10.3* 10.3* 9.8*  HCT 33.4* 32.9* 31.6*  MCV 89.8 90.4 90.8  PLT 62* 67* 62*   Cardiac Enzymes:  Recent Labs Lab 08/01/13 2155  TROPONINI <0.30     Recent Labs  05/16/13 2320 08/01/13 2155  PROBNP 34.3 21.1   CBG:  Recent Labs Lab 08/03/13 1117 08/03/13 1431 08/03/13 1649 08/03/13 2114 08/04/13 0736  GLUCAP 97 100* 121* 80 91    Principal Problem:   Hepatic encephalopathy Active Problems:   COPD (chronic obstructive pulmonary disease)   Cirrhosis   Seizure disorder   Anemia   Pancytopenia   Acute hepatic encephalopathy   Time coordinating discharge: 25 minutes  Signed:  [name], MD [facility] 08/04/2013, 10:21 AM

## 2013-08-04 NOTE — Progress Notes (Signed)
[facility] PROGRESS NOTE  [name] [name] QVZ:[id number] DOB: 1961/02/27 DOA: 08/01/2013 PCP: Carolee Rota, NP  Assessment/Plan: 1. Acute hepatic encephalopathy: Resolved. Hold Xanax and pain medication.                 2. Possible fall prior to admission, questionable syncope: No compelling evidence at this point to suggest syncope. Most likely fall was secondary to encephalopathy. No evidence of bony injury to the head or neck.  Telemetry today sinus rhythm. 3. Pancytopenia: Stable. Secondary to liver disease. 4. Displaced scaphoid waist fracture: chronic appearance on x-ray. Patient denies pain. No gross abnormalities of the wrist. This is chronic per wife. 5. Cirrhosis: Continue current medications, Lasix, lactulose 6. Diabetes mellitus: Stable on sliding scale insulin. 7. History of seizure disorder: Stable. No history or symptomatology to suggest seizure. Continue Keppra. 8. Bipolar 1 disorder 9. COPD  10. Depression 11. Mental retardation 12. Hard of hearing   Home today with home health RN, PT Recommend weaning off Xanax if possible  Careful use of pain medication  [name], MD  [facility]  Pager [phone] If 7PM-7AM, please contact night-coverage at [email or web address], password [email or web address] 08/04/2013, 10:16 AM  LOS: 3 days   Summary: 52 year old man with history of cirrhosis, chronic pain and multiple other issues presented with acute encephalopathy, possible fall or syncope and was admitted for further evaluation.  Consultants:  none  Procedures:  none  Antibiotics:  none  HPI/Subjective: No new issues, would like to go home. No concerns per nursing.  Objective: Filed Vitals:   08/03/13 0943 08/03/13 1410 08/03/13 2232 08/04/13 0616  BP: 146/84 126/67 108/64 133/57  Pulse: 69 67 65 62  Temp:  98.3 F (36.8 C) 98.1 F (36.7 C) 98.4 F (36.9 C)  TempSrc:  Oral Oral Oral  Resp:  18 17 19   Weight:      SpO2:  97% 98% 96%     Intake/Output Summary (Last 24 hours) at 08/04/13 1016 Last data filed at 08/04/13 0800  Gross per 24 hour  Intake      0 ml  Output    300 ml  Net   -300 ml     Filed Weights   08/02/13 0128  Weight: 93.7 kg (206 lb 9.1 oz)    Exam:   Afebrile, vital signs stable  General: Appears calm and comfortable.  Cardiovascular: Regular rate and rhythm.  Respiratory: Clear to auscultation bilaterally. No wheezes, rales or rhonchi. Normal respiratory effort.  Abdomen soft.  Psychiatric: Grossly normal mood and affect. Speech fluent and appropriate. Oriented to self, [facility], month.  Data Reviewed:  Ammonia level 57  Scheduled Meds: . aspirin EC  81 mg Oral Daily  . FLUoxetine  20 mg Oral Daily  . furosemide  20 mg Oral Daily  . insulin aspart  0-5 Units Subcutaneous QHS  . insulin aspart  0-9 Units Subcutaneous TID WC  . lactulose  30 g Oral QID  . levETIRAcetam  500 mg Oral BID  . metoprolol tartrate  50 mg Oral QHS  . metoprolol tartrate  25 mg Oral q morning - 10a  . multivitamin with minerals  1 tablet Oral Daily  . pantoprazole  40 mg Oral Daily  . potassium chloride SA  20 mEq Oral Daily  . sodium chloride  3 mL Intravenous Q12H   Continuous Infusions:   Principal Problem:   Hepatic encephalopathy Active Problems:   COPD (chronic obstructive pulmonary disease)   Cirrhosis   Seizure  disorder   Anemia   Pancytopenia   Acute hepatic encephalopathy

## 2013-10-23 ENCOUNTER — Emergency Department (HOSPITAL_COMMUNITY): Payer: Medicare Other

## 2013-10-23 ENCOUNTER — Inpatient Hospital Stay (HOSPITAL_COMMUNITY)
Admission: EM | Admit: 2013-10-23 | Discharge: 2013-10-25 | DRG: 442 | Disposition: A | Discharge status: Home Health | Payer: Medicare Other | Attending: Internal Medicine | Admitting: Internal Medicine

## 2013-10-23 ENCOUNTER — Encounter (HOSPITAL_COMMUNITY): Payer: Self-pay | Admitting: Emergency Medicine

## 2013-10-23 DIAGNOSIS — Z8249 Family history of ischemic heart disease and other diseases of the circulatory system: Secondary | ICD-10-CM

## 2013-10-23 DIAGNOSIS — Z79899 Other long term (current) drug therapy: Secondary | ICD-10-CM

## 2013-10-23 DIAGNOSIS — E119 Type 2 diabetes mellitus without complications: Secondary | ICD-10-CM | POA: Diagnosis present

## 2013-10-23 DIAGNOSIS — D689 Coagulation defect, unspecified: Secondary | ICD-10-CM

## 2013-10-23 DIAGNOSIS — Z800 Family history of malignant neoplasm of digestive organs: Secondary | ICD-10-CM

## 2013-10-23 DIAGNOSIS — F319 Bipolar disorder, unspecified: Secondary | ICD-10-CM | POA: Diagnosis present

## 2013-10-23 DIAGNOSIS — E876 Hypokalemia: Secondary | ICD-10-CM | POA: Diagnosis present

## 2013-10-23 DIAGNOSIS — D61818 Other pancytopenia: Secondary | ICD-10-CM | POA: Diagnosis present

## 2013-10-23 DIAGNOSIS — F17200 Nicotine dependence, unspecified, uncomplicated: Secondary | ICD-10-CM | POA: Diagnosis present

## 2013-10-23 DIAGNOSIS — M549 Dorsalgia, unspecified: Secondary | ICD-10-CM | POA: Diagnosis present

## 2013-10-23 DIAGNOSIS — K7682 Hepatic encephalopathy: Principal | ICD-10-CM | POA: Diagnosis present

## 2013-10-23 DIAGNOSIS — F79 Unspecified intellectual disabilities: Secondary | ICD-10-CM | POA: Diagnosis present

## 2013-10-23 DIAGNOSIS — J449 Chronic obstructive pulmonary disease, unspecified: Secondary | ICD-10-CM | POA: Diagnosis present

## 2013-10-23 DIAGNOSIS — G8929 Other chronic pain: Secondary | ICD-10-CM | POA: Diagnosis present

## 2013-10-23 DIAGNOSIS — D696 Thrombocytopenia, unspecified: Secondary | ICD-10-CM | POA: Diagnosis present

## 2013-10-23 DIAGNOSIS — Z720 Tobacco use: Secondary | ICD-10-CM

## 2013-10-23 DIAGNOSIS — R32 Unspecified urinary incontinence: Secondary | ICD-10-CM

## 2013-10-23 DIAGNOSIS — K7200 Acute and subacute hepatic failure without coma: Secondary | ICD-10-CM

## 2013-10-23 DIAGNOSIS — E785 Hyperlipidemia, unspecified: Secondary | ICD-10-CM | POA: Diagnosis present

## 2013-10-23 DIAGNOSIS — M19079 Primary osteoarthritis, unspecified ankle and foot: Secondary | ICD-10-CM

## 2013-10-23 DIAGNOSIS — H9190 Unspecified hearing loss, unspecified ear: Secondary | ICD-10-CM | POA: Diagnosis present

## 2013-10-23 DIAGNOSIS — K7689 Other specified diseases of liver: Secondary | ICD-10-CM | POA: Diagnosis present

## 2013-10-23 DIAGNOSIS — F329 Major depressive disorder, single episode, unspecified: Secondary | ICD-10-CM

## 2013-10-23 DIAGNOSIS — K219 Gastro-esophageal reflux disease without esophagitis: Secondary | ICD-10-CM

## 2013-10-23 DIAGNOSIS — M129 Arthropathy, unspecified: Secondary | ICD-10-CM | POA: Diagnosis present

## 2013-10-23 DIAGNOSIS — K7290 Hepatic failure, unspecified without coma: Principal | ICD-10-CM

## 2013-10-23 DIAGNOSIS — J4489 Other specified chronic obstructive pulmonary disease: Secondary | ICD-10-CM | POA: Diagnosis present

## 2013-10-23 DIAGNOSIS — D649 Anemia, unspecified: Secondary | ICD-10-CM | POA: Diagnosis present

## 2013-10-23 DIAGNOSIS — Z825 Family history of asthma and other chronic lower respiratory diseases: Secondary | ICD-10-CM

## 2013-10-23 DIAGNOSIS — Z7982 Long term (current) use of aspirin: Secondary | ICD-10-CM

## 2013-10-23 DIAGNOSIS — H5440 Blindness, one eye, unspecified eye: Secondary | ICD-10-CM | POA: Diagnosis present

## 2013-10-23 DIAGNOSIS — R600 Localized edema: Secondary | ICD-10-CM

## 2013-10-23 DIAGNOSIS — K920 Hematemesis: Secondary | ICD-10-CM

## 2013-10-23 DIAGNOSIS — K7460 Unspecified cirrhosis of liver: Secondary | ICD-10-CM | POA: Diagnosis present

## 2013-10-23 DIAGNOSIS — I10 Essential (primary) hypertension: Secondary | ICD-10-CM | POA: Diagnosis present

## 2013-10-23 DIAGNOSIS — Z91199 Patient's noncompliance with other medical treatment and regimen due to unspecified reason: Secondary | ICD-10-CM

## 2013-10-23 DIAGNOSIS — Z9119 Patient's noncompliance with other medical treatment and regimen: Secondary | ICD-10-CM

## 2013-10-23 DIAGNOSIS — E7220 Disorder of urea cycle metabolism, unspecified: Secondary | ICD-10-CM | POA: Diagnosis present

## 2013-10-23 DIAGNOSIS — S82843A Displaced bimalleolar fracture of unspecified lower leg, initial encounter for closed fracture: Secondary | ICD-10-CM

## 2013-10-23 DIAGNOSIS — F32A Depression, unspecified: Secondary | ICD-10-CM

## 2013-10-23 DIAGNOSIS — Z23 Encounter for immunization: Secondary | ICD-10-CM

## 2013-10-23 DIAGNOSIS — G40909 Epilepsy, unspecified, not intractable, without status epilepticus: Secondary | ICD-10-CM | POA: Diagnosis present

## 2013-10-23 LAB — COMPREHENSIVE METABOLIC PANEL
ALBUMIN: 3 g/dL — AB (ref 3.5–5.2)
ALT: 17 U/L (ref 0–53)
AST: 42 U/L — ABNORMAL HIGH (ref 0–37)
Alkaline Phosphatase: 121 U/L — ABNORMAL HIGH (ref 39–117)
BUN: 13 mg/dL (ref 6–23)
CALCIUM: 9.3 mg/dL (ref 8.4–10.5)
CO2: 31 mEq/L (ref 19–32)
Chloride: 107 mEq/L (ref 96–112)
Creatinine, Ser: 0.85 mg/dL (ref 0.50–1.35)
GFR calc Af Amer: >90 mL/min (ref >90)
Glucose, Bld: 111 mg/dL — ABNORMAL HIGH (ref 70–99)
Potassium: 3.8 mEq/L (ref 3.7–5.3)
Sodium: 145 mEq/L (ref 137–147)
Total Bilirubin: 1.3 mg/dL — ABNORMAL HIGH (ref 0.3–1.2)
Total Protein: 6.9 g/dL (ref 6.0–8.3)

## 2013-10-23 LAB — MRSA PCR SCREENING: MRSA by PCR: NEGATIVE

## 2013-10-23 LAB — URINALYSIS, ROUTINE W REFLEX MICROSCOPIC
Glucose, UA: 100 mg/dL — AB
Hgb urine dipstick: NEGATIVE
Ketones, ur: NEGATIVE mg/dL
LEUKOCYTES UA: NEGATIVE
Nitrite: NEGATIVE
Protein, ur: NEGATIVE mg/dL
Specific Gravity, Urine: 1.025 (ref 1.005–1.030)
Urobilinogen, UA: >8 mg/dL — ABNORMAL HIGH (ref 0.0–1.0)
pH: 6 (ref 5.0–8.0)

## 2013-10-23 LAB — CBC WITH DIFFERENTIAL/PLATELET
BASOS PCT: 1 % (ref 0–1)
Basophils Absolute: 0 10*3/uL (ref 0.0–0.1)
EOS PCT: 4 % (ref 0–5)
Eosinophils Absolute: 0.1 10*3/uL (ref 0.0–0.7)
HEMATOCRIT: 32.1 % — AB (ref 39.0–52.0)
Hemoglobin: 10.4 g/dL — ABNORMAL LOW (ref 13.0–17.0)
Lymphocytes Relative: 37 % (ref 12–46)
Lymphs Abs: 1 10*3/uL (ref 0.7–4.0)
MCH: 29.5 pg (ref 26.0–34.0)
MCHC: 32.4 g/dL (ref 30.0–36.0)
MCV: 91.2 fL (ref 78.0–100.0)
MONO ABS: 0.4 10*3/uL (ref 0.1–1.0)
Monocytes Relative: 14 % — ABNORMAL HIGH (ref 3–12)
Neutro Abs: 1.2 10*3/uL — ABNORMAL LOW (ref 1.7–7.7)
Neutrophils Relative %: 45 % (ref 43–77)
Platelets: 59 10*3/uL — ABNORMAL LOW (ref 150–400)
RBC: 3.52 MIL/uL — ABNORMAL LOW (ref 4.22–5.81)
RDW: 16.5 % — ABNORMAL HIGH (ref 11.5–15.5)
WBC: 2.7 10*3/uL — ABNORMAL LOW (ref 4.0–10.5)

## 2013-10-23 LAB — AMMONIA: Ammonia: 143 umol/L — ABNORMAL HIGH (ref 11–60)

## 2013-10-23 LAB — GLUCOSE, CAPILLARY
Glucose-Capillary: 100 mg/dL — ABNORMAL HIGH (ref 70–99)
Glucose-Capillary: 98 mg/dL (ref 70–99)
Glucose-Capillary: 98 mg/dL (ref 70–99)

## 2013-10-23 MED ORDER — PANTOPRAZOLE SODIUM 40 MG IV SOLR
40.0000 mg | Freq: Every day | INTRAVENOUS | Status: DC
  Administered 2013-10-23: 40 mg via INTRAVENOUS
  Filled 2013-10-23: qty 40

## 2013-10-23 MED ORDER — INFLUENZA VAC SPLIT QUAD 0.5 ML IM SUSP
0.5000 mL | INTRAMUSCULAR | Status: DC
  Filled 2013-10-23: qty 0.5

## 2013-10-23 MED ORDER — ACETAMINOPHEN 650 MG RE SUPP: 650.0000 mg | Freq: Four times a day (QID) | RECTAL | Status: DC | PRN

## 2013-10-23 MED ORDER — LEVETIRACETAM 100 MG/ML PO SOLN
500.0000 mg | Freq: Two times a day (BID) | ORAL | Status: DC
  Administered 2013-10-23 – 2013-10-25 (×5): 500 mg via ORAL
  Filled 2013-10-23 (×7): qty 5

## 2013-10-23 MED ORDER — ACETAMINOPHEN 325 MG PO TABS
650.0000 mg | ORAL_TABLET | Freq: Four times a day (QID) | ORAL | Status: DC | PRN
  Administered 2013-10-24 – 2013-10-25 (×3): 650 mg via ORAL
  Filled 2013-10-23 (×4): qty 2

## 2013-10-23 MED ORDER — RIFAXIMIN 550 MG PO TABS
550.0000 mg | ORAL_TABLET | Freq: Two times a day (BID) | ORAL | Status: DC
  Administered 2013-10-23 – 2013-10-25 (×5): 550 mg via ORAL
  Filled 2013-10-23 (×7): qty 1

## 2013-10-23 MED ORDER — LACTULOSE ENEMA
300.0000 mL | Freq: Once | ORAL | Status: DC
  Filled 2013-10-23: qty 300

## 2013-10-23 MED ORDER — LACTULOSE 10 GM/15ML PO SOLN
30.0000 g | Freq: Three times a day (TID) | ORAL | Status: DC
  Administered 2013-10-23 (×3): 30 g
  Filled 2013-10-23 (×3): qty 60

## 2013-10-23 MED ORDER — INSULIN ASPART 100 UNIT/ML ~~LOC~~ SOLN: 0.0000 [IU] | SUBCUTANEOUS | Status: DC

## 2013-10-23 MED ORDER — ONDANSETRON HCL 4 MG/2ML IJ SOLN: 4.0000 mg | Freq: Four times a day (QID) | INTRAMUSCULAR | Status: DC | PRN

## 2013-10-23 MED ORDER — ONDANSETRON HCL 4 MG PO TABS: 4.0000 mg | ORAL_TABLET | Freq: Four times a day (QID) | ORAL | Status: DC | PRN

## 2013-10-23 MED ORDER — SODIUM CHLORIDE 0.9 % IV SOLN
INTRAVENOUS | Status: DC
  Administered 2013-10-23: 13:00:00 via INTRAVENOUS

## 2013-10-23 MED ORDER — PNEUMOCOCCAL VAC POLYVALENT 25 MCG/0.5ML IJ INJ
0.5000 mL | INJECTION | INTRAMUSCULAR | Status: DC
  Filled 2013-10-23: qty 0.5

## 2013-10-23 NOTE — H&P (Signed)
The patient seen and examined. Above note reviewed.  This is a 53 year old male with history of liver cirrhosis who has a history of being noncompliant with lactulose. There is no family available with him in the emergency room and the patient is extremely somnolent. He does open eyes to sternal rub but cannot engage in conversation otherwise. Ammonia level was found to be elevated at 143. Plan is to place NG tube and administered lactulose. We will start on cephalexin. Can consider gastroenterology consultation in the morning if patient does not improve. He appears to mildly dehydrated and will receive gentle hydration.  [name] [name]

## 2013-10-23 NOTE — H&P (Signed)
[facility] History and Physical  [name] [name] QIH:[id number] DOB: [birth year] DOA: 10/23/2013  Referring physician:  PCP: Carolee Rota, NP   Chief Complaint: AMS  HPI: [name] [name] is a 53 y.o. male hx of MR and  liver cirrhosis secondary to NASH, HTN, DM, COPD, cardiomegaly brought to ED due to AMS. Information obtained from staff and EMS report. Associated symptoms include lethargy and unresponsiveness. Reportedly patient has not been taking lactulose. Work up in ED yields ct head unremarkable, ammonia level 143. VSS, he is not hypoxic. We are asked to admit  Review of Systems:  Unable to obtain due to AMS   Past Medical History  Diagnosis Date  . Depression   . 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 Bipolar 1 disorder   . Hypertension   . Diabetes mellitus   . Seizures   . COPD (chronic obstructive pulmonary disease)   . Mental retardation   . Blind right eye   . GERD (gastroesophageal reflux disease)   . Chronic back pain   . Hepatic encephalopathy   . Anemia   . Thrombocytopenia   . Cardiomegaly   . Anxiety   . Arthritis   . Cholelithiasis     Asymptomatic  . Cerebrovascular disease     Right vertebral artery  . Hard of hearing   . Hyperlipidemia   . Cataract     Right eye  . Blood clots in brain     per patient  . Cirrhosis     diagnosed 07/2010 when presented with first episode of hepatic encephalopathy, afp on 416/13=4.8,  U/S on 12/14/11  liver stable, pt has received 2 hep A/B vaccines  . Diverticulosis    Past Surgical History  Procedure Laterality Date  . Tonsillectomy    . Ear operations    . Cast application  03/29/5642    Procedure: MINOR CAST APPLICATION;  Surgeon: [name], MD;  Location: [facility];  Service: Orthopedics;  Laterality: Right;  no anesthesia , procedure room do not need or room !  . Orif ankle fracture  08/27/2011    Procedure: OPEN REDUCTION INTERNAL FIXATION (ORIF) ANKLE FRACTURE;  Surgeon: [name], MD;   Location: [facility];  Service: Orthopedics;  Laterality: Right;  . Esophagogastroduodenoscopy  02/02/2011    Rourk-Normal esophagus.  No varices/Question mild portal gastropathy, extrinsic compression on the antrum, lesser curvature of uncertain significance, some minimally  nodular mucosa status post biopsy, patent pylorus, normal duodenum 1 and duodenum 2.  . Colonoscopy  12/15/11    colonic divericulosis;poor prep, ACBE  recommended but has not been done  . Esophagogastroduodenoscopy  12/15/11    Rourk-->mild changes of portal gastropathy, gastric/duodenal erosions s/p bx  (reactive changes with mild chronic inflammation. No H.pylori  . Foot surgery     Social History:  reports that he has been smoking Cigarettes.  He has a 30 pack-year smoking history. He does not have any smokeless tobacco history on file. He reports that he does not drink alcohol or use illicit drugs.  Allergies  Allergen Reactions  . Penicillins Rash    Family History  Problem Relation Age of Onset  . Heart failure Mother   . Thyroid disease Mother   . Cancer Father   . Asthma Brother   . Heart attack Brother   . Cirrhosis Brother     EtOH  . Colon cancer Maternal Aunt      Prior to Admission medications   Medication Sig Start Date  End Date Taking? Authorizing Provider  lidocaine (XYLOCAINE) 2 % jelly Apply 1 application topically as needed. Irritation/pain   Yes Historical Provider, MD  ALPRAZolam Duanne Moron) 0.5 MG tablet Take 0.5 mg by mouth 3 (three) times daily as needed for anxiety.    Historical Provider, MD  aspirin EC 81 MG tablet Take 81 mg by mouth daily.    Historical Provider, MD  Cholecalciferol (VITAMIN D) 2000 UNITS tablet Take 1 tablet (2,000 Units total) by mouth daily. 09/20/12   Nimish Luther Parody, MD  fish oil-omega-3 fatty acids 1000 MG capsule Take 1 g by mouth 3 (three) times daily.    Historical Provider, MD  FLUoxetine (PROZAC) 20 MG capsule Take 1 capsule (20 mg total) by mouth daily. 09/20/12    Nimish Luther Parody, MD  furosemide (LASIX) 20 MG tablet Take 20 mg by mouth daily.    Historical Provider, MD  glimepiride (AMARYL) 2 MG tablet Take 2 mg by mouth daily before breakfast.    Historical Provider, MD  lactulose (CHRONULAC) 10 GM/15ML solution Take 60 mLs (40 g total) by mouth 3 (three) times daily. Titrate for 3-4 bowel motions daily. 02/02/13   Nimish Luther Parody, MD  levETIRAcetam (KEPPRA) 500 MG tablet Take 500 mg by mouth 2 (two) times daily.    Historical Provider, MD  metoprolol tartrate (LOPRESSOR) 25 MG tablet Take 25-50 mg by mouth 2 (two) times daily. Take 25 mg in the morning and 50 mg at night    Historical Provider, MD  Multiple Vitamin (MULTIVITAMIN WITH MINERALS) TABS Take 1 tablet by mouth daily.    Historical Provider, MD  omeprazole (PRILOSEC) 20 MG capsule Take 1 capsule (20 mg total) by mouth daily. 09/20/12   Nimish Luther Parody, MD  Oxycodone HCl 10 MG TABS Take 10 mg by mouth every 6 (six) hours as needed (for pain).    Historical Provider, MD  potassium chloride SA (K-DUR,KLOR-CON) 20 MEQ tablet Take 1 tablet (20 mEq total) by mouth daily. 09/20/12   Nimish Luther Parody, MD   Physical Exam: Filed Vitals:   10/23/13 1104  BP: 126/58  Pulse: 56  Temp:   Resp: 10    BP 126/58  Pulse 56  Temp(Src) 98.4 F (36.9 C) (Rectal)  Resp 10  Wt 93.441 kg (206 lb)  SpO2 100%  General:  Well nourished NAD Eyes: right eye with cataract, no scleral icterus ENT: mucus membranes mouth dry/pink,  Neck: no LAD, masses or thyromegaly Cardiovascular: RRR, no m/r/g. No LE edema. Respiratory: CTA bilaterally, no w/r/r. Normal respiratory effort somewhat shallow Abdomen: soft, ntnd Skin: no rash or induration seen on limited exam Musculoskeletal: grossly normal tone BUE/BLE Psychiatric: grossly normal mood and affect, speech fluent and appropriate Neurologic: opens eyes briefly to vigorouos sternal rub.           Labs on Admission:  Basic Metabolic Panel:  Recent  Labs Lab 10/23/13 0942  NA 145  K 3.8  CL 107  CO2 31  GLUCOSE 111*  BUN 13  CREATININE 0.85  CALCIUM 9.3   Liver Function Tests:  Recent Labs Lab 10/23/13 0942  AST 42*  ALT 17  ALKPHOS 121*  BILITOT 1.3*  PROT 6.9  ALBUMIN 3.0*   No results found for this basename: LIPASE, AMYLASE,  in the last 168 hours  Recent Labs Lab 10/23/13 0942  AMMONIA 143*   CBC:  Recent Labs Lab 10/23/13 0942  WBC 2.7*  NEUTROABS 1.2*  HGB 10.4*  HCT 32.1*  MCV 91.2  PLT 59*   Cardiac Enzymes: No results found for this basename: CKTOTAL, CKMB, CKMBINDEX, TROPONINI,  in the last 168 hours  BNP (last 3 results)  Recent Labs  05/16/13 2320 08/01/13 2155  PROBNP 34.3 21.1   CBG: No results found for this basename: GLUCAP,  in the last 168 hours  Radiological Exams on Admission: Ct Head Wo Contrast  10/23/2013   CLINICAL DATA:  53 year old male altered mental status, found down. Initial encounter.  EXAM: CT HEAD WITHOUT CONTRAST  TECHNIQUE: Contiguous axial images were obtained from the base of the skull through the vertex without intravenous contrast.  COMPARISON:  08/01/2013 head CT and earlier.  FINDINGS: Previous bilateral mastoidectomies. Stable paranasal sinuses. Stable orbits, phthisis bulbi the right. No scalp hematoma identified. No acute osseous abnormality identified.  Mild Calcified atherosclerosis at the skull base. Cerebral volume is within normal limits for age. No midline shift, ventriculomegaly, mass effect, evidence of mass lesion, intracranial hemorrhage or evidence of cortically based acute infarction. Gray-white matter differentiation is within normal limits throughout the brain. No suspicious intracranial vascular hyperdensity.  IMPRESSION: Stable and normal for age non contrast CT appearance of the brain.   Electronically Signed   By: Lars Pinks M.D.   On: 10/23/2013 09:56   Dg Chest Portable 1 View  10/23/2013   CLINICAL DATA:  Altered mental status.  EXAM:  PORTABLE CHEST - 1 VIEW  COMPARISON:  Chest x-ray 08/01/2013.  FINDINGS: Lung volumes are low. No consolidative airspace disease. No pleural effusions. Cephalization of the pulmonary vasculature, without frank pulmonary edema. Mild cardiomegaly. The patient is rotated to the right on today's exam, resulting in distortion of the mediastinal contours and reduced diagnostic sensitivity and specificity for mediastinal pathology.  IMPRESSION: 1. Mild cardiomegaly with pulmonary venous congestion, but no frank pulmonary edema.   Electronically Signed   By: Vinnie Langton M.D.   On: 10/23/2013 09:13    EKG: Independently reviewed sinus bradycardia with nonspecific T wave  Assessment/Plan Principal Problem:  Acute Hepatic encephalopathy: Likely related to noncompliance with medications. Will admit. Will have nasogastric tube inserted and administer lactulose 30 mils 3 times a day. Will repeat ammonia level in the morning. If no improvement in the morning we'll consider GI consult. Will hold his home Xanax and oxycodone. Active Problems: Pancytopenia: Secondary to liver disease. Chart review indicates levels are close to baseline    COPD (chronic obstructive pulmonary disease): Appears stable at baseline    DM (diabetes mellitus): Will hold his oral agents for now. Will use sliding scale insulin for optimal glycemic control.    HTN (hypertension): Controlled.    Cirrhosis: See #1. Will resume his home medications when able. Secondary to NASH.    Thrombocytopenia: Current level close to baseline. Signs and symptoms of overt bleeding. Will monitor    Anemia: Close to baseline monitor    Code Status: full until able to speak to family Family Communication: left message with family Disposition Plan: home when ready  Time spent: 103 minutes  [facility] Pager [phone]

## 2013-10-23 NOTE — ED Notes (Signed)
Pt found on floor by caregiver this am. Pt lethargic. H/s MR. Caregiver states pt has not been taking his lactulose. Pt not responding to verbal or tactile stimuli at present.

## 2013-10-23 NOTE — ED Notes (Signed)
Dr.Memon at bedside.

## 2013-10-23 NOTE — ED Provider Notes (Signed)
CSN: [id number]     Arrival date & time 10/23/13  9379 History  This chart was scribed for Maudry Diego, MD by Zettie Pho, ED Scribe. This patient was seen in room [facility] and the patient's care was started at 8:45 AM.    Chief Complaint  Patient presents with  . Altered Mental Status   Patient is a 53 y.o. male presenting with altered mental status. The history is provided by the EMS personnel. The history is limited by a developmental delay, the absence of a caregiver and the condition of the patient. No language interpreter was used.  Altered Mental Status Presenting symptoms: lethargy and partial responsiveness   Severity:  Moderate Most recent episode:  Today Timing:  Constant Progression:  Unchanged Chronicity:  New Context: not taking medications as prescribed and nursing home resident     Level 5 Caveat- Altered Mental Status  HPI Comments: [name] [name] is a 53 y.o. male with a history of MR and cerebrovascular disease who presents to the Emergency Department complaining of altered mental status including unresponsiveness and lethargy onset PTA. His caregiver reported to EMS that the patient has not been taking his lactulose. EMS states that patient has been responsive to verbal stimuli of his name, but not tactile stimuli or other commands. Patient also has a history of cirrhosis, HTN, hyperlipidemia, DM, COPD, hepatic encephalopathy, thrombocytopenia, cardiomegaly.   Past Medical History  Diagnosis Date  . Depression   . 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 Bipolar 1 disorder   . Hypertension   . Diabetes mellitus   . Seizures   . COPD (chronic obstructive pulmonary disease)   . Mental retardation   . Blind right eye   . GERD (gastroesophageal reflux disease)   . Chronic back pain   . Hepatic encephalopathy   . Anemia   . Thrombocytopenia   . Cardiomegaly   . Anxiety   . Arthritis   . Cholelithiasis     Asymptomatic  . Cerebrovascular disease     Right  vertebral artery  . Hard of hearing   . Hyperlipidemia   . Cataract     Right eye  . Blood clots in brain     per patient  . Cirrhosis     diagnosed 07/2010 when presented with first episode of hepatic encephalopathy, afp on 416/13=4.8,  U/S on 12/14/11  liver stable, pt has received 2 hep A/B vaccines  . Diverticulosis    Past Surgical History  Procedure Laterality Date  . Tonsillectomy    . Ear operations    . Cast application  0/09/4095    Procedure: MINOR CAST APPLICATION;  Surgeon: [name], MD;  Location: [facility];  Service: Orthopedics;  Laterality: Right;  no anesthesia , procedure room do not need or room !  . Orif ankle fracture  08/27/2011    Procedure: OPEN REDUCTION INTERNAL FIXATION (ORIF) ANKLE FRACTURE;  Surgeon: [name], MD;  Location: [facility];  Service: Orthopedics;  Laterality: Right;  . Esophagogastroduodenoscopy  02/02/2011    Rourk-Normal esophagus.  No varices/Question mild portal gastropathy, extrinsic compression on the antrum, lesser curvature of uncertain significance, some minimally  nodular mucosa status post biopsy, patent pylorus, normal duodenum 1 and duodenum 2.  . Colonoscopy  12/15/11    colonic divericulosis;poor prep, ACBE  recommended but has not been done  . Esophagogastroduodenoscopy  12/15/11    Rourk-->mild changes of portal gastropathy, gastric/duodenal erosions s/p bx  (reactive changes with mild  chronic inflammation. No H.pylori  . Foot surgery     Family History  Problem Relation Age of Onset  . Heart failure Mother   . Thyroid disease Mother   . Cancer Father   . Asthma Brother   . Heart attack Brother   . Cirrhosis Brother     EtOH  . Colon cancer Maternal Aunt    History  Substance Use Topics  . Smoking status: Current Every Day Smoker -- 1.00 packs/day for 30 years    Types: Cigarettes  . Smokeless tobacco: Not on file     Comment: only smokes about 1-2 a day  . Alcohol Use: No    Review of Systems  Unable to  perform ROS  Allergies  Penicillins  Home Medications   Current Outpatient Rx  Name  Route  Sig  Dispense  Refill  . ALPRAZolam (XANAX) 0.5 MG tablet   Oral   Take 0.5 mg by mouth 3 (three) times daily as needed for anxiety.         Marland Kitchen aspirin EC 81 MG tablet   Oral   Take 81 mg by mouth daily.         . Cholecalciferol (VITAMIN D) 2000 UNITS tablet   Oral   Take 1 tablet (2,000 Units total) by mouth daily.   30 tablet   0   . fish oil-omega-3 fatty acids 1000 MG capsule   Oral   Take 1 g by mouth 3 (three) times daily.         Marland Kitchen FLUoxetine (PROZAC) 20 MG capsule   Oral   Take 1 capsule (20 mg total) by mouth daily.   30 capsule   0   . furosemide (LASIX) 20 MG tablet   Oral   Take 20 mg by mouth daily.         Marland Kitchen glimepiride (AMARYL) 2 MG tablet   Oral   Take 2 mg by mouth daily before breakfast.         . lactulose (CHRONULAC) 10 GM/15ML solution   Oral   Take 60 mLs (40 g total) by mouth 3 (three) times daily. Titrate for 3-4 bowel motions daily.   240 mL   0   . levETIRAcetam (KEPPRA) 500 MG tablet   Oral   Take 500 mg by mouth 2 (two) times daily.         Marland Kitchen lidocaine (XYLOCAINE) 2 % jelly   Topical   Apply topically as needed.   30 mL   0   . metoprolol tartrate (LOPRESSOR) 25 MG tablet   Oral   Take 25-50 mg by mouth 2 (two) times daily. Take 25 mg in the morning and 50 mg at night         . Multiple Vitamin (MULTIVITAMIN WITH MINERALS) TABS   Oral   Take 1 tablet by mouth daily.         Marland Kitchen omeprazole (PRILOSEC) 20 MG capsule   Oral   Take 1 capsule (20 mg total) by mouth daily.   30 capsule   0   . Oxycodone HCl 10 MG TABS   Oral   Take 10 mg by mouth every 6 (six) hours as needed (for pain).         . potassium chloride SA (K-DUR,KLOR-CON) 20 MEQ tablet   Oral   Take 1 tablet (20 mEq total) by mouth daily.   30 tablet   0    Triage Vitals: BP 116/56  Pulse 58  Temp(Src) 98.4 F (36.9 C) (Rectal)  Resp 10  Wt  206 lb (93.441 kg)  SpO2 99%  Physical Exam  Constitutional: He appears well-developed.  HENT:  Head: Normocephalic and atraumatic.  Eyes: Conjunctivae and EOM are normal. No scleral icterus.  Blind in the right eye.   Neck: Neck supple. No thyromegaly present.  Cardiovascular: Normal rate and regular rhythm.  Exam reveals no gallop and no friction rub.   No murmur heard. Pulmonary/Chest: No stridor. He has no wheezes. He has no rales. He exhibits no tenderness.  Abdominal: He exhibits no distension. There is no tenderness. There is no rebound.  Musculoskeletal: Normal range of motion. He exhibits no edema.  Lymphadenopathy:    He has no cervical adenopathy.  Neurological: He exhibits normal muscle tone. Coordination normal.  Only responsive to painful stimuli.   Skin: No rash noted. No erythema.  Psychiatric: He has a normal mood and affect. His behavior is normal.    ED Course  Procedures (including critical care time)  DIAGNOSTIC STUDIES: Oxygen Saturation is 99% on room air, normal by my interpretation.    COORDINATION OF CARE: 8:48 AM- Will order UA, EKG, blood labs (CBC, CMP, ammonia), a chest x-ray, and a head CT. Will insert a foley catheter.   10:53 AM- Patient is sleeping comfortably in the room. Patient is still only responsive to painful stimuli, but not verbal stimuli.   Labs Review Labs Reviewed  CBC WITH DIFFERENTIAL - Abnormal; Notable for the following:    WBC 2.7 (*)    RBC 3.52 (*)    Hemoglobin 10.4 (*)    HCT 32.1 (*)    RDW 16.5 (*)    Platelets 59 (*)    Neutro Abs 1.2 (*)    Monocytes Relative 14 (*)    All other components within normal limits  COMPREHENSIVE METABOLIC PANEL - Abnormal; Notable for the following:    Glucose, Bld 111 (*)    Albumin 3.0 (*)    AST 42 (*)    Alkaline Phosphatase 121 (*)    Total Bilirubin 1.3 (*)    All other components within normal limits  AMMONIA - Abnormal; Notable for the following:    Ammonia 143 (*)     All other components within normal limits  URINALYSIS, ROUTINE W REFLEX MICROSCOPIC - Abnormal; Notable for the following:    Glucose, UA 100 (*)    Bilirubin Urine SMALL (*)    Urobilinogen, UA >8.0 (*)    All other components within normal limits   Imaging Review Ct Head Wo Contrast  10/23/2013   CLINICAL DATA:  53 year old male altered mental status, found down. Initial encounter.  EXAM: CT HEAD WITHOUT CONTRAST  TECHNIQUE: Contiguous axial images were obtained from the base of the skull through the vertex without intravenous contrast.  COMPARISON:  08/01/2013 head CT and earlier.  FINDINGS: Previous bilateral mastoidectomies. Stable paranasal sinuses. Stable orbits, phthisis bulbi the right. No scalp hematoma identified. No acute osseous abnormality identified.  Mild Calcified atherosclerosis at the skull base. Cerebral volume is within normal limits for age. No midline shift, ventriculomegaly, mass effect, evidence of mass lesion, intracranial hemorrhage or evidence of cortically based acute infarction. Gray-white matter differentiation is within normal limits throughout the brain. No suspicious intracranial vascular hyperdensity.  IMPRESSION: Stable and normal for age non contrast CT appearance of the brain.   Electronically Signed   By: Lars Pinks M.D.   On: 10/23/2013 09:56   Dg  Chest Portable 1 View  10/23/2013   CLINICAL DATA:  Altered mental status.  EXAM: PORTABLE CHEST - 1 VIEW  COMPARISON:  Chest x-ray 08/01/2013.  FINDINGS: Lung volumes are low. No consolidative airspace disease. No pleural effusions. Cephalization of the pulmonary vasculature, without frank pulmonary edema. Mild cardiomegaly. The patient is rotated to the right on today's exam, resulting in distortion of the mediastinal contours and reduced diagnostic sensitivity and specificity for mediastinal pathology.  IMPRESSION: 1. Mild cardiomegaly with pulmonary venous congestion, but no frank pulmonary edema.   Electronically  Signed   By: Vinnie Langton M.D.   On: 10/23/2013 09:13     EKG Interpretation None     CRITICAL CARE Performed by: Alayza Pieper L Total critical care time45 Critical care time was exclusive of separately billable procedures and treating other patients. Critical care was necessary to treat or prevent imminent or life-threatening deterioration. Critical care was time spent personally by me on the following activities: development of treatment plan with patient and/or surrogate as well as nursing, discussions with consultants, evaluation of patient's response to treatment, examination of patient, obtaining history from patient or surrogate, ordering and performing treatments and interventions, ordering and review of laboratory studies, ordering and review of radiographic studies, pulse oximetry and re-evaluation of patient's condition.   MDM   Final diagnoses:  None      Maudry Diego, MD 10/23/13 [phone]

## 2013-10-23 NOTE — Progress Notes (Deleted)
Dr. Luan Pulling notified of patient most recent troponin 15.61. Dr. Luan Pulling to consult with Domenic Polite.

## 2013-10-24 LAB — GLUCOSE, CAPILLARY
Glucose-Capillary: 105 mg/dL — ABNORMAL HIGH (ref 70–99)
Glucose-Capillary: 115 mg/dL — ABNORMAL HIGH (ref 70–99)
Glucose-Capillary: 120 mg/dL — ABNORMAL HIGH (ref 70–99)
Glucose-Capillary: 87 mg/dL (ref 70–99)
Glucose-Capillary: 87 mg/dL (ref 70–99)
Glucose-Capillary: 98 mg/dL (ref 70–99)

## 2013-10-24 LAB — CBC
HCT: 33.5 % — ABNORMAL LOW (ref 39.0–52.0)
Hemoglobin: 10.7 g/dL — ABNORMAL LOW (ref 13.0–17.0)
MCH: 29.2 pg (ref 26.0–34.0)
MCHC: 31.9 g/dL (ref 30.0–36.0)
MCV: 91.5 fL (ref 78.0–100.0)
Platelets: 63 10*3/uL — ABNORMAL LOW (ref 150–400)
RBC: 3.66 MIL/uL — ABNORMAL LOW (ref 4.22–5.81)
RDW: 16.7 % — ABNORMAL HIGH (ref 11.5–15.5)
WBC: 4.4 10*3/uL (ref 4.0–10.5)

## 2013-10-24 LAB — COMPREHENSIVE METABOLIC PANEL
ALK PHOS: 114 U/L (ref 39–117)
ALT: 17 U/L (ref 0–53)
AST: 41 U/L — ABNORMAL HIGH (ref 0–37)
Albumin: 2.9 g/dL — ABNORMAL LOW (ref 3.5–5.2)
BILIRUBIN TOTAL: 1.7 mg/dL — AB (ref 0.3–1.2)
BUN: 11 mg/dL (ref 6–23)
CO2: 26 meq/L (ref 19–32)
Calcium: 8.7 mg/dL (ref 8.4–10.5)
Chloride: 110 mEq/L (ref 96–112)
Creatinine, Ser: 0.69 mg/dL (ref 0.50–1.35)
GFR calc non Af Amer: >90 mL/min (ref >90)
GLUCOSE: 88 mg/dL (ref 70–99)
POTASSIUM: 3.3 meq/L — AB (ref 3.7–5.3)
Sodium: 147 mEq/L (ref 137–147)
Total Protein: 6.5 g/dL (ref 6.0–8.3)

## 2013-10-24 LAB — PROTIME-INR
INR: 1.51 — AB (ref 0.00–1.49)
Prothrombin Time: 17.8 seconds — ABNORMAL HIGH (ref 11.6–15.2)

## 2013-10-24 LAB — AMMONIA: Ammonia: 61 umol/L — ABNORMAL HIGH (ref 11–60)

## 2013-10-24 MED ORDER — POTASSIUM CHLORIDE CRYS ER 20 MEQ PO TBCR
30.0000 meq | EXTENDED_RELEASE_TABLET | Freq: Two times a day (BID) | ORAL | Status: AC
  Administered 2013-10-24 (×2): 30 meq via ORAL
  Filled 2013-10-24 (×4): qty 1

## 2013-10-24 MED ORDER — POTASSIUM CHLORIDE IN NACL 20-0.9 MEQ/L-% IV SOLN
INTRAVENOUS | Status: DC
  Administered 2013-10-24 – 2013-10-25 (×3): via INTRAVENOUS

## 2013-10-24 MED ORDER — INSULIN ASPART 100 UNIT/ML ~~LOC~~ SOLN: 0.0000 [IU] | SUBCUTANEOUS | Status: DC

## 2013-10-24 MED ORDER — PANTOPRAZOLE SODIUM 40 MG PO TBEC
40.0000 mg | DELAYED_RELEASE_TABLET | Freq: Every day | ORAL | Status: DC
  Administered 2013-10-24 – 2013-10-25 (×2): 40 mg via ORAL
  Filled 2013-10-24 (×2): qty 1

## 2013-10-24 MED ORDER — LACTULOSE 10 GM/15ML PO SOLN
30.0000 g | Freq: Three times a day (TID) | ORAL | Status: DC
  Administered 2013-10-24 – 2013-10-25 (×4): 30 g via ORAL
  Filled 2013-10-24 (×4): qty 60

## 2013-10-24 MED ORDER — METOPROLOL TARTRATE 25 MG PO TABS
25.0000 mg | ORAL_TABLET | Freq: Two times a day (BID) | ORAL | Status: DC
  Administered 2013-10-24 – 2013-10-25 (×3): 25 mg via ORAL
  Filled 2013-10-24 (×3): qty 1

## 2013-10-24 NOTE — Evaluation (Signed)
Physical Therapy Evaluation Patient Details Name: [name] [name] MRN: [id number] DOB: 12/22/1960 Today's Date: 10/24/2013 Time: 1130-1200 PT Time Calculation (min): 30 min  PT Assessment / Plan / Recommendation History of Present Illness  [name] [name] is a 53 y.o. male hx of MR and  liver cirrhosis secondary to NASH, HTN, DM, COPD, cardiomegaly brought to ED due to AMS. Information obtained from staff and EMS report. Associated symptoms include lethargy and unresponsiveness. Reportedly patient has not been taking lactulose. Work up in ED yields ct head unremarkable, ammonia level 143. VSS, he is not hypoxic.  Clinical Impression  Pt is a 53 yo male who normally walks with a cane and lives with his wife.  He appears to be close to prior functional level.  We will see pt to determine if he is safe ambulating with a cane vs. A walker.  Pt will benefit from home health therapy to make sure home is set up as safely as possible.     PT Assessment  Patient needs continued PT services    Follow Up Recommendations  Home health PT    Does the patient have the potential to tolerate intense rehabilitation    no  Barriers to Discharge  none      Equipment Recommendations  None recommended by PT    Recommendations for Other Services   none  Frequency Min 3X/week    Precautions / Restrictions Precautions Precautions: None Restrictions Weight Bearing Restrictions: No   Pertinent Vitals/Pain None noted      Mobility  Bed Mobility Overal bed mobility: Modified Independent Transfers Overall transfer level: Modified independent Ambulation/Gait Ambulation/Gait assistance: Modified independent (Device/Increase time) Ambulation Distance (Feet): 120 Feet Assistive device: Rolling walker (2 wheeled) Gait Pattern/deviations: Decreased step length - right;Decreased step length - left;Leaning posteriorly        PT Diagnosis: Abnormality of gait (Pt states this is how he normally walks )   PT Problem List: Decreased balance;Other (comment) (assess safety of ambulation with cane) PT Treatment Interventions: Gait training;Balance training     PT Goals(Current goals can be found in the care plan section) Acute Rehab PT Goals PT Goal Formulation: With patient Time For Goal Achievement: 10/26/13 Potential to Achieve Goals: Good  Visit Information  Last PT Received On: 10/24/13 History of Present Illness: [name] [name] is a 53 y.o. male hx of MR and  liver cirrhosis secondary to NASH, HTN, DM, COPD, cardiomegaly brought to ED due to AMS. Information obtained from staff and EMS report. Associated symptoms include lethargy and unresponsiveness. Reportedly patient has not been taking lactulose. Work up in ED yields ct head unremarkable, ammonia level 143. VSS, he is not hypoxic.       Prior [place] expects to be discharged to:: Private residence Living Arrangements: Spouse/significant other Available Help at Discharge: Family Type of Home: Apartment Home Access: Level entry Home Layout: One Snead: Environmental consultant - 2 wheels;Cane - single point Prior Function Level of Independence: Needs assistance Gait / Transfers Assistance Needed: uses straight cane ADL's / Homemaking Assistance Needed: wife due to visual impairment Communication Communication: HOH    Cognition  Cognition Arousal/Alertness: Awake/alert Overall Cognitive Status: Within Functional Limits for tasks assessed    Extremity/Trunk Assessment Lower Extremity Assessment Lower Extremity Assessment: Overall WFL for tasks assessed   Balance    End of Session PT - End of Session Equipment Utilized During Treatment: Gait belt Activity Tolerance: Patient tolerated treatment well Patient left: in  chair;with call bell/phone within reach;with chair alarm set  GP     Johna Kearl,CINDY 10/24/2013, 12:03 PM

## 2013-10-24 NOTE — Progress Notes (Signed)
UR chart review completed.  

## 2013-10-24 NOTE — Clinical Social Work Note (Signed)
RN reports pt requesting to see CSW. Pt well known to CSW from previous admissions. Pt requesting to go to SNF. CSW discussed that pt may be appropriate for ALF according to PT notes. He has considered this in the past, but due to finances always ends up returning home. Pt aware that situation has not changed and requests CM to call his wife about home health. CM notified. CSW will sign off, but can be reconsulted if needed.   Benay Pike, [place]

## 2013-10-24 NOTE — Progress Notes (Signed)
[facility] PROGRESS NOTE  [name] [name] CBJ:[id number] DOB: 10/30/1960 DOA: 10/23/2013 PCP: Carolee Rota, NP   Summary: 53 year old man with history of cirrhosis secondary to NASH, chronic pain and multiple other issues presented with acute encephalopathy related to non-compliance with medications.     Assessment/Plan: Acute Hepatic encephalopathy: Likely related to noncompliance with medications. Much improved this am. NG tube removed as patient alert enough for oral medications. Will continue Lactulose. rifaxamin added on admission. Will continue this as well.  Await ammonia level. Of note, home lactulose dose 40gm TID. Currently on 30gm TID. Will monitor. Will transfer to medical floor.    Active Problems:  Hypokalemia: likely related to decreased po intake yesterday. Will add to IV fluids. Will recheck in am.  Pancytopenia: Secondary to liver disease. Chart review indicates levels are close to baseline   COPD (chronic obstructive pulmonary disease): Remains stable at baseline   DM (diabetes mellitus): will resume carb modified diet now that he is alert.  He is on amaryl at home. Will continue to hold this for now until po intake reliable. Continue sliding scale insulin for optimal glycemic control. CBG range 87-115.  HTN (hypertension): Fair control. Home medications include lasix and metoprolol. Both held on admission. Will resume metoprolol today. Continue to hold lasix for now. Monitor   Cirrhosis: See #1. Will resume his home medications when able. Secondary to NASH.   Thrombocytopenia: trending upward slightly.  Current level close to baseline. No signs and symptoms of overt bleeding. Will monitor   Anemia: normocytic. Likely related to chronic disease. remains close to baseline monitor  Seizure disorder: hx of same. No seizure activity. Will continue home keppra.  Code Status: full Family Communication: wife on phone. She will be unable to visit today. At this  point plans to take him home when read Disposition Plan: home vs snf hopefully tomorrow   Consultants:  none  Procedures:  none  Antibiotics:  none  HPI/Subjective: Sitting up in bed watching tv. Alert. Reports chronic pain in feet otherwise no complaints. Requesting food and water.   Objective: Filed Vitals:   10/24/13 0730  BP:   Pulse:   Temp: 98 F (36.7 C)  Resp:     Intake/Output Summary (Last 24 hours) at 10/24/13 0840 Last data filed at 10/24/13 0600  Gross per 24 hour  Intake  837.5 ml  Output    500 ml  Net  337.5 ml   Filed Weights   10/23/13 0844 10/23/13 1215 10/24/13 0500  Weight: 93.441 kg (206 lb) 88.9 kg (195 lb 15.8 oz) 88.5 kg (195 lb 1.7 oz)    Exam:   General:  Well nourished in no acute distress  Cardiovascular: S1 and S2. No murmur, gallup or rub. trace LE edema  Respiratory: normal effort with clear breath sounds bilaterally to auscultation. i hear no wheeze or rhonchi  Abdomen: soft, non-distended with+BS throughout. Non-tender to palpation  Musculoskeletal: moves all extremities. Bilateral feet slightly malformed. Joints without swelling/erythema.    Data Reviewed: Basic Metabolic Panel:  Recent Labs Lab 10/23/13 0942 10/24/13 0424  NA 145 147  K 3.8 3.3*  CL 107 110  CO2 31 26  GLUCOSE 111* 88  BUN 13 11  CREATININE 0.85 0.69  CALCIUM 9.3 8.7   Liver Function Tests:  Recent Labs Lab 10/23/13 0942 10/24/13 0424  AST 42* 41*  ALT 17 17  ALKPHOS 121* 114  BILITOT 1.3* 1.7*  PROT 6.9 6.5  ALBUMIN 3.0* 2.9*  No results found for this basename: LIPASE, AMYLASE,  in the last 168 hours  Recent Labs Lab 10/23/13 0942  AMMONIA 143*   CBC:  Recent Labs Lab 10/23/13 0942 10/24/13 0424  WBC 2.7* 4.4  NEUTROABS 1.2*  --   HGB 10.4* 10.7*  HCT 32.1* 33.5*  MCV 91.2 91.5  PLT 59* 63*   Cardiac Enzymes: No results found for this basename: CKTOTAL, CKMB, CKMBINDEX, TROPONINI,  in the last 168 hours BNP  (last 3 results)  Recent Labs  05/16/13 2320 08/01/13 2155  PROBNP 34.3 21.1   CBG:  Recent Labs Lab 10/23/13 1633 10/23/13 1943 10/24/13 0003 10/24/13 0339 10/24/13 0733  GLUCAP 98 98 115* 87 87    Recent Results (from the past 240 hour(s))  MRSA PCR SCREENING     Status: None   Collection Time    10/23/13 12:13 PM      Result Value Ref Range Status   MRSA by PCR NEGATIVE  NEGATIVE Final   Comment:            The GeneXpert MRSA Assay (FDA     approved for NASAL specimens     only), is one component of a     comprehensive MRSA colonization     surveillance program. It is not     intended to diagnose MRSA     infection nor to guide or     monitor treatment for     MRSA infections.     Studies: Ct Head Wo Contrast  10/23/2013   CLINICAL DATA:  53 year old male altered mental status, found down. Initial encounter.  EXAM: CT HEAD WITHOUT CONTRAST  TECHNIQUE: Contiguous axial images were obtained from the base of the skull through the vertex without intravenous contrast.  COMPARISON:  08/01/2013 head CT and earlier.  FINDINGS: Previous bilateral mastoidectomies. Stable paranasal sinuses. Stable orbits, phthisis bulbi the right. No scalp hematoma identified. No acute osseous abnormality identified.  Mild Calcified atherosclerosis at the skull base. Cerebral volume is within normal limits for age. No midline shift, ventriculomegaly, mass effect, evidence of mass lesion, intracranial hemorrhage or evidence of cortically based acute infarction. Gray-white matter differentiation is within normal limits throughout the brain. No suspicious intracranial vascular hyperdensity.  IMPRESSION: Stable and normal for age non contrast CT appearance of the brain.   Electronically Signed   By: Lars Pinks M.D.   On: 10/23/2013 09:56   Dg Chest Portable 1 View  10/23/2013   CLINICAL DATA:  Altered mental status.  EXAM: PORTABLE CHEST - 1 VIEW  COMPARISON:  Chest x-ray 08/01/2013.  FINDINGS: Lung  volumes are low. No consolidative airspace disease. No pleural effusions. Cephalization of the pulmonary vasculature, without frank pulmonary edema. Mild cardiomegaly. The patient is rotated to the right on today's exam, resulting in distortion of the mediastinal contours and reduced diagnostic sensitivity and specificity for mediastinal pathology.  IMPRESSION: 1. Mild cardiomegaly with pulmonary venous congestion, but no frank pulmonary edema.   Electronically Signed   By: Vinnie Langton M.D.   On: 10/23/2013 09:13    Scheduled Meds: . insulin aspart  0-9 Units Subcutaneous 6 times per day  . lactulose  30 g Oral TID  . levETIRAcetam  500 mg Oral BID  . pantoprazole  40 mg Oral Daily  . rifaximin  550 mg Oral BID   Continuous Infusions: . 0.9 % NaCl with KCl 20 mEq / L      Principal Problem:   Hepatic encephalopathy Active Problems:  COPD (chronic obstructive pulmonary disease)   DM (diabetes mellitus)   HTN (hypertension)   Cirrhosis   Thrombocytopenia   Anemia   Pancytopenia   Hyperammonemia   Hypokalemia    Time spent: 30 minutes    [facility] Pager [phone]. If 7PM-7AM, please contact night-coverage at [email or web address], password [facility] 10/24/2013, 8:40 AM  LOS: 1 day

## 2013-10-24 NOTE — Progress Notes (Addendum)
Report given to A.Roger,RN. Patient transferred to 341 in stable condition via wheelchair. Left message for wife to call back for an update.

## 2013-10-24 NOTE — Progress Notes (Signed)
The patient was seen and examined. He was discussed with nurse practitioner, Ms. Renard Hamper. Agree with her assessment, findings, and plan. The patient is alert and oriented x3. His ammonia level is pending. We'll continue management as outlined by Ms. Black, but will decrease the IV fluids and start potassium chloride supplementation. Will restart metoprolol at a slightly lower dose. Will ask the nursing staff to ambulate the patient. Possible discharge tomorrow.

## 2013-10-25 DIAGNOSIS — D61818 Other pancytopenia: Secondary | ICD-10-CM

## 2013-10-25 LAB — BASIC METABOLIC PANEL
BUN: 11 mg/dL (ref 6–23)
CHLORIDE: 111 meq/L (ref 96–112)
CO2: 25 mEq/L (ref 19–32)
Calcium: 8 mg/dL — ABNORMAL LOW (ref 8.4–10.5)
Creatinine, Ser: 0.7 mg/dL (ref 0.50–1.35)
GFR calc Af Amer: >90 mL/min (ref >90)
GFR calc non Af Amer: >90 mL/min (ref >90)
GLUCOSE: 101 mg/dL — AB (ref 70–99)
POTASSIUM: 3.8 meq/L (ref 3.7–5.3)
Sodium: 144 mEq/L (ref 137–147)

## 2013-10-25 LAB — GLUCOSE, CAPILLARY
Glucose-Capillary: 113 mg/dL — ABNORMAL HIGH (ref 70–99)
Glucose-Capillary: 117 mg/dL — ABNORMAL HIGH (ref 70–99)
Glucose-Capillary: 118 mg/dL — ABNORMAL HIGH (ref 70–99)
Glucose-Capillary: 92 mg/dL (ref 70–99)

## 2013-10-25 LAB — CBC
HCT: 29.4 % — ABNORMAL LOW (ref 39.0–52.0)
HEMOGLOBIN: 9.4 g/dL — AB (ref 13.0–17.0)
MCH: 29.2 pg (ref 26.0–34.0)
MCHC: 32 g/dL (ref 30.0–36.0)
MCV: 91.3 fL (ref 78.0–100.0)
Platelets: 53 10*3/uL — ABNORMAL LOW (ref 150–400)
RBC: 3.22 MIL/uL — ABNORMAL LOW (ref 4.22–5.81)
RDW: 16.5 % — ABNORMAL HIGH (ref 11.5–15.5)
WBC: 3.3 10*3/uL — ABNORMAL LOW (ref 4.0–10.5)

## 2013-10-25 LAB — AMMONIA: Ammonia: 64 umol/L — ABNORMAL HIGH (ref 11–60)

## 2013-10-25 MED ORDER — RIFAXIMIN 550 MG PO TABS: 550.0000 mg | ORAL_TABLET | Freq: Two times a day (BID) | ORAL | Status: DC

## 2013-10-25 NOTE — Progress Notes (Signed)
10/25/13 1358 Patient left floor in stable condition via w/c accompanied by nurse tech. Home health set up with [place]. Patient and wife agreeable, aware of home health arrangements, contact information provided prior to discharge. Discharged home. Donavan Foil, RN

## 2013-10-25 NOTE — Discharge Summary (Signed)
The patient was seen and examined. He was discussed with nurse practitioner, Ms. Renard Hamper. Agree with her assessment, findings, and plan. Discussed the importance of compliance with the patient and his wife. There was some concern about the patient having frequent bowel movements when he went to his doctor's appointments or when he left home. He was instructed to hold his lactulose in the morning or before his appointments and to resume his lactulose when he came back home rather than stopping it altogether. He voiced understanding.

## 2013-10-25 NOTE — Clinical Social Work Note (Signed)
CSW talked with pt's wife as pt said again he wanted to go to SNF. Wife states she would like pt to return home and she feels she can manage his care. Pt is concerned that it is too much for his wife to handle. He then said he felt that she was managing her money poorly. CSW clarified that this was her money and he states yes. Pt again said that he cannot afford ALF. His wife reports that he has a monthly loan payment that comes out of his check and his medication costs are high. They cannot afford one medication that is 700 a month. CSW encouraged them to look into Medicaid if considering placement and work with home health social worker. They are agreeable and this will give them time to work out financial concerns.   [name] [name], [place]

## 2013-10-25 NOTE — Discharge Instructions (Signed)
Hepatic Encephalopathy °Hepatic encephalopathy is a syndrome. This is a set of symptoms that occur together. It is seen mostly in patients with damage to the liver known as cirrhosis. This is where normal liver tissue has been replaced by scar tissue.  °Symptoms of the syndrome include: °· Changes in personality. °· Mental impairment. °· A depressed level of consciousness. °These changes occur because toxins build up in the bloodstream. The build up occurs because the scarred liver cannot rid toxins from the body. The most important of these toxins is ammonia. Toxins can cause abnormal behavior and confusion. Toxins in the blood stream can impair your ability to take care of yourself or others. Some people become very sleepy and cannot be woken easily. In severe cases, the patient lapses into a coma.  °CAUSES  °There are many things that can cause liver damage that can lead to buildup of toxins. These include: °· Diseases that cause cirrhosis of the liver. °· Long-term alcohol use with progressive liver damage. °· Hepatitis B or C with ongoing infection and liver damage. °· Patients without cirrhosis who have undergone shunt surgery. °· Kidney failure. °· Bleeding in the stomach or intestines. °· Infection. °· Constipation. °· Medications that act upon the central nervous system. °· Diuretic therapy. °· Excessive dietary protein. °SYMPTOMS  °Symptoms of this syndrome are categorized or "staged" based on severity.  °· Stage 0. Minimal hepatic encephalopathy. No detectable changes in personality or behavior. Minimal changes in memory, concentration, mental function, and physical ability. °· Stage 1. Some lack of awareness. Shortened attention span. Problems with addition or subtraction. Possible problems with sleeping or a reversal of the normal sleep pattern. Euphoria, depression, or irritability may be present. Mild confusion. Slowing of mental ability. Tremors may be detected. °· Stage 2. Lethargy or apathy.  Disoriented. Strange behavior. Slurred speech. Obvious tremors. Drowsiness, unable to perform mental tasks. Personality changes, and confusion about time. °· Stage 3. Very sleepy but can be aroused. Unable to perform mental tasks, cannot keep track of time and place, marked confusion, amnesia, occasional fits of rage, speech cannot be understood. °· Stage 4. Coma with or without response to painful stimuli. °DIAGNOSIS  °In mild cases, a careful history and physical exam may lead your caregiver to consider possible mild hepatic encephalopathy as the cause of symptoms. The diagnosis is clearer in more severe cases. An elevated blood ammonia level is the classic blood test abnormality in patients with this syndrome. Other tests can be helpful to rule out other diseases.  °TREATMENT  °· Medications are often used to lower the ammonia level in the blood. This usually leads to improvement. °· Diets containing vegetable proteins are better than diets rich in animal protein, especially proteins derived from red meats. Eating well-cooked chicken and fish in addition to vegetable protein should be discussed with your caregiver. Malnourished patients are encouraged to add liquid nutritional supplements to their diet. °· Antibiotics are sometimes used to try to lessen the volume of bacteria in the intestines that produce ammonia. °· Moderate to severe cases of this syndrome usually require a hospital stay and medicine that is given directly into a vein (intravenously). °HOME CARE INSTRUCTIONS  °The goal at home is to avoid things that can make the condition worse and lead to a buildup of ammonia in the blood. °· Eat a well balanced diet. Your caregiver can help you with suggestions on this. °· Talk to your caregiver before taking vitamin supplements. Large doses of vitamins and minerals,   especially vitamin A, iron, or copper, can worsen liver damage.  A low salt diet, water restriction, or diuretic medicine may be needed to  reduce fluid retention.  Avoid alcohol and acetaminophen as well as any over-the-counter medications that contain acetaminophen (check labels). Only take over-the-counter or prescription medicines for pain, discomfort, or fever as directed by your caregiver.  Avoid drugs that are toxic to the liver. Review your medications (both prescription and non-prescription) with your caregiver to make sure those you are taking will not be harmful.  Blood tests may be needed. Follow your caregiver's advice regarding the timing of these.  With this condition you play a critical role in maintaining your own good health. The failure to follow your caregiver's advice and these instructions may result in permanent disability or death. SEEK MEDICAL CARE IF:   You have increasing fatigue or weakness.  You develop increasing swelling of the abdomen, hands, feet, legs or face.  You develop loss of appetite.  You are feeling sick to your stomach (nausea) and vomiting.  You develop jaundice. This is a yellow discoloration of the skin.  You develop worsening problems with concentration, confusion, and/or problems with sleep. SEEK IMMEDIATE MEDICAL CARE IF:   You vomit bright red blood or a coffee ground-looking material.  You have blood in your stools. Or the stools turn black and tarry.  You have a fever.  You develop easy bruising or bleeding.  You have a return of slurred speech, change in behavior, or confusion. MAKE SURE YOU:   Understand these instructions.  Will watch your condition.  Will get help right away if you are not doing well or get worse. Document Released: 10/19/2006 Document Revised: 11/01/2011 Document Reviewed: 07/26/2007 [facility] Patient Information 2014 [place], [place].

## 2013-10-25 NOTE — Progress Notes (Signed)
10/25/13 1333 Reviewed discharge instructions with patient, wife at bedside. Given copy of AVS, follow-up appointment already scheduled for tomorrow. Prescription called in to pharmacy per MD, patient and wife aware. Noted when home medications next due on list. Verbalized understanding of instructions, when to call MD. IV sites d/c'd, within normal limits. No c/o pain at time of discharge. Pt in stable condition awaiting w/c for discharge. Donavan Foil, RN

## 2013-10-25 NOTE — Discharge Summary (Signed)
Physician Discharge Summary  MCCORMICK MACON XTK:[id number] DOB: 02-17-1961 DOA: 10/23/2013  PCP: Carolee Rota, NP  Admit date: 10/23/2013 Discharge date: 10/25/2013  Time spent: 40 minutes  Recommendations for Outpatient Follow-up:  1. Patient has appointment with PCP Dr Olena Heckle 10/26/13. 2. Home health RN, SW to be scheduled  Discharge Diagnoses:  Principal Problem:   Hepatic encephalopathy Active Problems:  Hyperammonemia   Hypokalemia  Pancytopenia   COPD (chronic obstructive pulmonary disease)   DM (diabetes mellitus)   HTN (hypertension)   Cirrhosis   Seizure disorder   Thrombocytopenia   Anemia    Discharge Condition: stable  Diet recommendation: carb modified  Filed Weights   10/23/13 0844 10/23/13 1215 10/24/13 0500  Weight: 93.441 kg (206 lb) 88.9 kg (195 lb 15.8 oz) 88.5 kg (195 lb 1.7 oz)    History of present illness:  [name] [name] is a 53 y.o. 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 Information obtained from staff and EMS report. Reportedly he has been non-compliant with lactulose.  Associated symptoms include lethargy and unresponsiveness. Work up in ED yields ct head unremarkable, ammonia level 143. VSS, he is not hypoxic.   Hospital Course:  Acute Hepatic encephalopathy: Likely related to noncompliance with medications. Provided with NG tube and lactulose administered via tube. He quickly improved and ammonia level reduced to 64 within 24 hours. NG tube removed. Will continue Lactulose. rifaxamin added. Patient encouraged to take medications daily as directed. He has appointment with his PCP 10/26/13.  Active Problems:  Hypokalemia: likely related to decreased po intake. repleted and resolved at discharge.   Pancytopenia: Secondary to liver disease. Chart review indicates levels are close to baseline   COPD (chronic obstructive pulmonary disease): Remained stable at baseline   DM  (diabetes mellitus): provided with carb modified diet. He is on amaryl at home and will resume at discharge. CBG range 87-115.   HTN (hypertension): Fair control. Home medications include lasix and metoprolol. Both held on admission. Metoprolol resumed on 10/24/13. Resume home lasix and potassium supplements at discharge.   Cirrhosis: See #1.  Secondary to NASH.   Thrombocytopenia: stable during this hospitalization. Current level close to baseline. No signs and symptoms of overt bleeding.    Anemia: normocytic. Likely related to chronic disease. remains close to baseline   Seizure disorder: hx of same. No seizure activity. Will continue home keppra.    Procedures:  none  Consultations:  none  Discharge Exam: Filed Vitals:   10/25/13 0500  BP: 135/67  Pulse: 67  Temp: 98 F (36.7 C)  Resp: 16    General: well nourished appears calm Cardiovascular: RRR S1 and S2. No m/g/r. Trace LE edema Respiratory: normal effort BS are clear bilaterally to auscultation. No wheeze or rhonchi noted  Discharge Instructions  Discharge Orders   Future Orders Complete By Expires   Diet - low sodium heart healthy  As directed    Discharge instructions  As directed    Comments:     Take medications as directed. Do not stop lactulose. Take lactulose every day Follow up appointment with PCP Dr. Olena Heckle 10/26/13   Increase activity slowly  As directed        Medication List         ALPRAZolam 0.5 MG tablet  Commonly known as:  XANAX  Take 0.5 mg by mouth 3 (three) times daily as needed for anxiety.  aspirin EC 81 MG tablet  Take 81 mg by mouth daily.     fish oil-omega-3 fatty acids 1000 MG capsule  Take 1 g by mouth 3 (three) times daily.     FLUoxetine 20 MG capsule  Commonly known as:  PROZAC  Take 1 capsule (20 mg total) by mouth daily.     furosemide 20 MG tablet  Commonly known as:  LASIX  Take 20 mg by mouth daily.     glimepiride 2 MG tablet  Commonly known as:   AMARYL  Take 2 mg by mouth daily before breakfast.     lactulose 10 GM/15ML solution  Commonly known as:  CHRONULAC  Take 60 mLs (40 g total) by mouth 3 (three) times daily. Titrate for 3-4 bowel motions daily.     levETIRAcetam 500 MG tablet  Commonly known as:  KEPPRA  Take 500 mg by mouth 2 (two) times daily.     lidocaine 2 % jelly  Commonly known as:  XYLOCAINE  Apply 1 application topically as needed. Irritation/pain     metoprolol tartrate 25 MG tablet  Commonly known as:  LOPRESSOR  Take 25-50 mg by mouth 2 (two) times daily. Take 25 mg in the morning and 50 mg at night     multivitamin with minerals Tabs tablet  Take 1 tablet by mouth daily.     omeprazole 20 MG capsule  Commonly known as:  PRILOSEC  Take 1 capsule (20 mg total) by mouth daily.     Oxycodone HCl 10 MG Tabs  Take 10 mg by mouth every 6 (six) hours as needed (for pain).     potassium chloride SA 20 MEQ tablet  Commonly known as:  K-DUR,KLOR-CON  Take 1 tablet (20 mEq total) by mouth daily.     rifaximin 550 MG Tabs tablet  Commonly known as:  XIFAXAN  Take 1 tablet (550 mg total) by mouth 2 (two) times daily.     Vitamin D 2000 UNITS tablet  Take 1 tablet (2,000 Units total) by mouth daily.       Allergies  Allergen Reactions  . Penicillins Rash       Follow-up Information   Follow up with Carolee Rota, NP. (patient has appointment 10/26/13)    Specialty:  Nurse Practitioner   Contact information:   [address] [place] [address] [phone]        The results of significant diagnostics from this hospitalization (including imaging, microbiology, ancillary and laboratory) are listed below for reference.    Significant Diagnostic Studies: Ct Head Wo Contrast  10/23/2013   CLINICAL DATA:  53 year old male altered mental status, found down. Initial encounter.  EXAM: CT HEAD WITHOUT CONTRAST  TECHNIQUE: Contiguous axial images were obtained from the base of the skull  through the vertex without intravenous contrast.  COMPARISON:  08/01/2013 head CT and earlier.  FINDINGS: Previous bilateral mastoidectomies. Stable paranasal sinuses. Stable orbits, phthisis bulbi the right. No scalp hematoma identified. No acute osseous abnormality identified.  Mild Calcified atherosclerosis at the skull base. Cerebral volume is within normal limits for age. No midline shift, ventriculomegaly, mass effect, evidence of mass lesion, intracranial hemorrhage or evidence of cortically based acute infarction. Gray-white matter differentiation is within normal limits throughout the brain. No suspicious intracranial vascular hyperdensity.  IMPRESSION: Stable and normal for age non contrast CT appearance of the brain.   Electronically Signed   By: Lars Pinks M.D.   On: 10/23/2013 09:56   Dg Chest Portable  1 View  10/23/2013   CLINICAL DATA:  Altered mental status.  EXAM: PORTABLE CHEST - 1 VIEW  COMPARISON:  Chest x-ray 08/01/2013.  FINDINGS: Lung volumes are low. No consolidative airspace disease. No pleural effusions. Cephalization of the pulmonary vasculature, without frank pulmonary edema. Mild cardiomegaly. The patient is rotated to the right on today's exam, resulting in distortion of the mediastinal contours and reduced diagnostic sensitivity and specificity for mediastinal pathology.  IMPRESSION: 1. Mild cardiomegaly with pulmonary venous congestion, but no frank pulmonary edema.   Electronically Signed   By: Vinnie Langton M.D.   On: 10/23/2013 09:13    Microbiology: Recent Results (from the past 240 hour(s))  MRSA PCR SCREENING     Status: None   Collection Time    10/23/13 12:13 PM      Result Value Ref Range Status   MRSA by PCR NEGATIVE  NEGATIVE Final   Comment:            The GeneXpert MRSA Assay (FDA     approved for NASAL specimens     only), is one component of a     comprehensive MRSA colonization     surveillance program. It is not     intended to diagnose MRSA      infection nor to guide or     monitor treatment for     MRSA infections.     Labs: Basic Metabolic Panel:  Recent Labs Lab 10/23/13 0942 10/24/13 0424 10/25/13 0500  NA 145 147 144  K 3.8 3.3* 3.8  CL 107 110 111  CO2 31 26 25   GLUCOSE 111* 88 101*  BUN 13 11 11   CREATININE 0.85 0.69 0.70  CALCIUM 9.3 8.7 8.0*   Liver Function Tests:  Recent Labs Lab 10/23/13 0942 10/24/13 0424  AST 42* 41*  ALT 17 17  ALKPHOS 121* 114  BILITOT 1.3* 1.7*  PROT 6.9 6.5  ALBUMIN 3.0* 2.9*   No results found for this basename: LIPASE, AMYLASE,  in the last 168 hours  Recent Labs Lab 10/23/13 0942 10/24/13 0903 10/25/13 0500  AMMONIA 143* 61* 64*   CBC:  Recent Labs Lab 10/23/13 0942 10/24/13 0424 10/25/13 0500  WBC 2.7* 4.4 3.3*  NEUTROABS 1.2*  --   --   HGB 10.4* 10.7* 9.4*  HCT 32.1* 33.5* 29.4*  MCV 91.2 91.5 91.3  PLT 59* 63* 53*   Cardiac Enzymes: No results found for this basename: CKTOTAL, CKMB, CKMBINDEX, TROPONINI,  in the last 168 hours BNP: BNP (last 3 results)  Recent Labs  05/16/13 2320 08/01/13 2155  PROBNP 34.3 21.1   CBG:  Recent Labs Lab 10/24/13 2055 10/25/13 0007 10/25/13 0439 10/25/13 0731 10/25/13 1124  GLUCAP 120* 117* 113* 92 118*       Signed:  BLACK,KAREN M  [facility] 10/25/2013, 12:05 PM

## 2014-01-03 ENCOUNTER — Emergency Department (HOSPITAL_COMMUNITY)
Admission: EM | Admit: 2014-01-03 | Discharge: 2014-01-03 | Disposition: A | Discharge status: Home / Self Care | Payer: Medicare Other | Attending: Emergency Medicine | Admitting: Emergency Medicine

## 2014-01-03 ENCOUNTER — Other Ambulatory Visit: Payer: Self-pay

## 2014-01-03 ENCOUNTER — Emergency Department (HOSPITAL_COMMUNITY): Payer: Medicare Other

## 2014-01-03 ENCOUNTER — Encounter (HOSPITAL_COMMUNITY): Payer: Self-pay | Admitting: Emergency Medicine

## 2014-01-03 DIAGNOSIS — K7682 Hepatic encephalopathy: Secondary | ICD-10-CM | POA: Insufficient documentation

## 2014-01-03 DIAGNOSIS — G8929 Other chronic pain: Secondary | ICD-10-CM | POA: Insufficient documentation

## 2014-01-03 DIAGNOSIS — G40909 Epilepsy, unspecified, not intractable, without status epilepticus: Secondary | ICD-10-CM | POA: Insufficient documentation

## 2014-01-03 DIAGNOSIS — R4789 Other speech disturbances: Secondary | ICD-10-CM | POA: Insufficient documentation

## 2014-01-03 DIAGNOSIS — F919 Conduct disorder, unspecified: Secondary | ICD-10-CM | POA: Insufficient documentation

## 2014-01-03 DIAGNOSIS — E119 Type 2 diabetes mellitus without complications: Secondary | ICD-10-CM | POA: Insufficient documentation

## 2014-01-03 DIAGNOSIS — Z79899 Other long term (current) drug therapy: Secondary | ICD-10-CM | POA: Insufficient documentation

## 2014-01-03 DIAGNOSIS — F319 Bipolar disorder, unspecified: Secondary | ICD-10-CM | POA: Insufficient documentation

## 2014-01-03 DIAGNOSIS — F411 Generalized anxiety disorder: Secondary | ICD-10-CM | POA: Insufficient documentation

## 2014-01-03 DIAGNOSIS — J4489 Other specified chronic obstructive pulmonary disease: Secondary | ICD-10-CM | POA: Insufficient documentation

## 2014-01-03 DIAGNOSIS — K7290 Hepatic failure, unspecified without coma: Secondary | ICD-10-CM | POA: Insufficient documentation

## 2014-01-03 DIAGNOSIS — Z862 Personal history of diseases of the blood and blood-forming organs and certain disorders involving the immune mechanism: Secondary | ICD-10-CM | POA: Insufficient documentation

## 2014-01-03 DIAGNOSIS — H5440 Blindness, one eye, unspecified eye: Secondary | ICD-10-CM | POA: Insufficient documentation

## 2014-01-03 DIAGNOSIS — I10 Essential (primary) hypertension: Secondary | ICD-10-CM | POA: Insufficient documentation

## 2014-01-03 DIAGNOSIS — K219 Gastro-esophageal reflux disease without esophagitis: Secondary | ICD-10-CM | POA: Insufficient documentation

## 2014-01-03 DIAGNOSIS — F17200 Nicotine dependence, unspecified, uncomplicated: Secondary | ICD-10-CM | POA: Insufficient documentation

## 2014-01-03 DIAGNOSIS — M129 Arthropathy, unspecified: Secondary | ICD-10-CM | POA: Insufficient documentation

## 2014-01-03 DIAGNOSIS — M79609 Pain in unspecified limb: Secondary | ICD-10-CM | POA: Insufficient documentation

## 2014-01-03 DIAGNOSIS — Z792 Long term (current) use of antibiotics: Secondary | ICD-10-CM | POA: Insufficient documentation

## 2014-01-03 DIAGNOSIS — E7220 Disorder of urea cycle metabolism, unspecified: Secondary | ICD-10-CM

## 2014-01-03 DIAGNOSIS — R32 Unspecified urinary incontinence: Secondary | ICD-10-CM | POA: Insufficient documentation

## 2014-01-03 DIAGNOSIS — F29 Unspecified psychosis not due to a substance or known physiological condition: Secondary | ICD-10-CM | POA: Insufficient documentation

## 2014-01-03 DIAGNOSIS — J449 Chronic obstructive pulmonary disease, unspecified: Secondary | ICD-10-CM | POA: Insufficient documentation

## 2014-01-03 DIAGNOSIS — Z7982 Long term (current) use of aspirin: Secondary | ICD-10-CM | POA: Insufficient documentation

## 2014-01-03 DIAGNOSIS — Z880 Allergy status to penicillin: Secondary | ICD-10-CM | POA: Insufficient documentation

## 2014-01-03 LAB — CBC WITH DIFFERENTIAL/PLATELET
BASOS ABS: 0 10*3/uL (ref 0.0–0.1)
Basophils Relative: 1 % (ref 0–1)
EOS ABS: 0.1 10*3/uL (ref 0.0–0.7)
EOS PCT: 4 % (ref 0–5)
HEMATOCRIT: 34 % — AB (ref 39.0–52.0)
Hemoglobin: 10.7 g/dL — ABNORMAL LOW (ref 13.0–17.0)
Lymphocytes Relative: 38 % (ref 12–46)
Lymphs Abs: 1.2 10*3/uL (ref 0.7–4.0)
MCH: 28.3 pg (ref 26.0–34.0)
MCHC: 31.5 g/dL (ref 30.0–36.0)
MCV: 89.9 fL (ref 78.0–100.0)
MONO ABS: 0.4 10*3/uL (ref 0.1–1.0)
Monocytes Relative: 14 % — ABNORMAL HIGH (ref 3–12)
Neutro Abs: 1.4 10*3/uL — ABNORMAL LOW (ref 1.7–7.7)
Neutrophils Relative %: 44 % (ref 43–77)
Platelets: 65 10*3/uL — ABNORMAL LOW (ref 150–400)
RBC: 3.78 MIL/uL — ABNORMAL LOW (ref 4.22–5.81)
RDW: 17.9 % — AB (ref 11.5–15.5)
WBC: 3.1 10*3/uL — AB (ref 4.0–10.5)

## 2014-01-03 LAB — URINALYSIS, ROUTINE W REFLEX MICROSCOPIC
Bilirubin Urine: NEGATIVE
Glucose, UA: NEGATIVE mg/dL
Hgb urine dipstick: NEGATIVE
Ketones, ur: NEGATIVE mg/dL
Leukocytes, UA: NEGATIVE
Nitrite: NEGATIVE
PROTEIN: NEGATIVE mg/dL
Specific Gravity, Urine: 1.02 (ref 1.005–1.030)
Urobilinogen, UA: 1 mg/dL (ref 0.0–1.0)
pH: 6.5 (ref 5.0–8.0)

## 2014-01-03 LAB — PROTIME-INR
INR: 1.57 — ABNORMAL HIGH (ref 0.00–1.49)
Prothrombin Time: 18.3 seconds — ABNORMAL HIGH (ref 11.6–15.2)

## 2014-01-03 LAB — COMPREHENSIVE METABOLIC PANEL
ALT: 22 U/L (ref 0–53)
AST: 57 U/L — AB (ref 0–37)
Albumin: 3 g/dL — ABNORMAL LOW (ref 3.5–5.2)
Alkaline Phosphatase: 112 U/L (ref 39–117)
BUN: 11 mg/dL (ref 6–23)
CALCIUM: 9.1 mg/dL (ref 8.4–10.5)
CO2: 26 meq/L (ref 19–32)
CREATININE: 0.69 mg/dL (ref 0.50–1.35)
Chloride: 105 mEq/L (ref 96–112)
GFR calc Af Amer: >90 mL/min (ref >90)
Glucose, Bld: 98 mg/dL (ref 70–99)
Potassium: 4.3 mEq/L (ref 3.7–5.3)
Sodium: 140 mEq/L (ref 137–147)
Total Bilirubin: 1.9 mg/dL — ABNORMAL HIGH (ref 0.3–1.2)
Total Protein: 6.8 g/dL (ref 6.0–8.3)

## 2014-01-03 LAB — AMMONIA: Ammonia: 75 umol/L — ABNORMAL HIGH (ref 11–60)

## 2014-01-03 LAB — LIPASE, BLOOD: Lipase: 56 U/L (ref 11–59)

## 2014-01-03 LAB — LACTIC ACID, PLASMA: Lactic Acid, Venous: 2.1 mmol/L (ref 0.5–2.2)

## 2014-01-03 LAB — PRO B NATRIURETIC PEPTIDE: Pro B Natriuretic peptide (BNP): 47.2 pg/mL (ref 0–125)

## 2014-01-03 MED ORDER — LACTULOSE 10 GM/15ML PO SOLN: 40.0000 g | Freq: Three times a day (TID) | ORAL | Status: DC

## 2014-01-03 NOTE — ED Notes (Signed)
Per wife pt is confused, not taking his medication and his ammonia level is up. Wife states she wants pt placed in [facility].

## 2014-01-03 NOTE — ED Provider Notes (Signed)
CSN: [id number]     Arrival date & time 01/03/14  1512 History  This chart was scribed for Carmin Muskrat, MD,  by Stacy Gardner, ED Scribe. The patient was seen in room APA04/APA04 and the patient's care was started at 3:30 PM.   First MD Initiated Contact with Patient 01/03/14 1522     Chief Complaint  Patient presents with  . Altered Mental Status     (Consider location/radiation/quality/duration/timing/severity/associated sxs/prior Treatment) Patient is a 53 y.o. male presenting with altered mental status. The history is provided by the patient and medical records. No language interpreter was used.  Altered Mental Status Presenting symptoms: confusion   Associated symptoms: nausea    HPI Comments: [name] [name] is a 53 y.o. male whose wife presents to the Emergency Department complaining of Altered Mental Status. Pt's wife reports that pt woke up this morning with urinary incontinence, confusion, difficulty speaking, slurred speech and prolonged sleeping. He state that he feels nauseous. Pt has severe chronic foot pain. 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 Pt did not drinking heavily. His medication changed Prozac 40 mg to 20 mg and there are not any other medication changes.   She requests that pt would be placed in a nursing home as she is unable to continue caring for him.   He has previously been in a facility, and     Past Medical History  Diagnosis Date  . Depression   . 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 Bipolar 1 disorder   . Hypertension   . Diabetes mellitus   . Seizures   . COPD (chronic obstructive pulmonary disease)   . Mental retardation   . Blind right eye   . GERD (gastroesophageal reflux disease)   . Chronic back pain   . Hepatic encephalopathy   . Anemia   . Thrombocytopenia   . Cardiomegaly   . Anxiety   . Arthritis   . Cholelithiasis     Asymptomatic  . Cerebrovascular disease     Right vertebral  artery  . Hard of hearing   . Hyperlipidemia   . Cataract     Right eye  . Blood clots in brain     per patient  . Cirrhosis     diagnosed 07/2010 when presented with first episode of hepatic encephalopathy, afp on 416/13=4.8,  U/S on 12/14/11  liver stable, pt has received 2 hep A/B vaccines  . Diverticulosis    Past Surgical History  Procedure Laterality Date  . Tonsillectomy    . Ear operations    . Cast application  10/26/91    Procedure: MINOR CAST APPLICATION;  Surgeon: [name], MD;  Location: [facility];  Service: Orthopedics;  Laterality: Right;  no anesthesia , procedure room do not need or room !  . Orif ankle fracture  08/27/2011    Procedure: OPEN REDUCTION INTERNAL FIXATION (ORIF) ANKLE FRACTURE;  Surgeon: [name], MD;  Location: [facility];  Service: Orthopedics;  Laterality: Right;  . Esophagogastroduodenoscopy  02/02/2011    Rourk-Normal esophagus.  No varices/Question mild portal gastropathy, extrinsic compression on the antrum, lesser curvature of uncertain significance, some minimally  nodular mucosa status post biopsy, patent pylorus, normal duodenum 1 and duodenum 2.  . Colonoscopy  12/15/11    colonic divericulosis;poor prep, ACBE  recommended but has not been done  . Esophagogastroduodenoscopy  12/15/11    Rourk-->mild changes of portal gastropathy, gastric/duodenal erosions  s/p bx  (reactive changes with mild chronic inflammation. No H.pylori  . Foot surgery     Family History  Problem Relation Age of Onset  . Heart failure Mother   . Thyroid disease Mother   . Cancer Father   . Asthma Brother   . Heart attack Brother   . Cirrhosis Brother     EtOH  . Colon cancer Maternal Aunt    History  Substance Use Topics  . Smoking status: Current Every Day Smoker -- 1.00 packs/day for 30 years    Types: Cigarettes  . Smokeless tobacco: Not on file     Comment: only smokes about 1-2 a day  . Alcohol Use: No    Review of Systems  Constitutional:  Positive for activity change.  Cardiovascular: Negative for leg swelling.  Gastrointestinal: Positive for nausea.  Genitourinary:       Urinary incontinence   Musculoskeletal:       Foot pain  Neurological: Positive for speech difficulty.  Psychiatric/Behavioral: Positive for behavioral problems and confusion.  All other systems reviewed and are negative.     Allergies  Penicillins  Home Medications   Prior to Admission medications   Medication Sig Start Date End Date Taking? Authorizing Provider  ALPRAZolam Duanne Moron) 0.5 MG tablet Take 0.5 mg by mouth 3 (three) times daily as needed for anxiety.    Historical Provider, MD  aspirin EC 81 MG tablet Take 81 mg by mouth daily.    Historical Provider, MD  Cholecalciferol (VITAMIN D) 2000 UNITS tablet Take 1 tablet (2,000 Units total) by mouth daily. 09/20/12   Nimish Luther Parody, MD  fish oil-omega-3 fatty acids 1000 MG capsule Take 1 g by mouth 3 (three) times daily.    Historical Provider, MD  FLUoxetine (PROZAC) 20 MG capsule Take 1 capsule (20 mg total) by mouth daily. 09/20/12   Nimish Luther Parody, MD  furosemide (LASIX) 20 MG tablet Take 20 mg by mouth daily.    Historical Provider, MD  glimepiride (AMARYL) 2 MG tablet Take 2 mg by mouth daily before breakfast.    Historical Provider, MD  lactulose (CHRONULAC) 10 GM/15ML solution Take 60 mLs (40 g total) by mouth 3 (three) times daily. Titrate for 3-4 bowel motions daily. 02/02/13   Nimish Luther Parody, MD  levETIRAcetam (KEPPRA) 500 MG tablet Take 500 mg by mouth 2 (two) times daily.    Historical Provider, MD  lidocaine (XYLOCAINE) 2 % jelly Apply 1 application topically as needed. Irritation/pain    Historical Provider, MD  metoprolol tartrate (LOPRESSOR) 25 MG tablet Take 25-50 mg by mouth 2 (two) times daily. Take 25 mg in the morning and 50 mg at night    Historical Provider, MD  Multiple Vitamin (MULTIVITAMIN WITH MINERALS) TABS Take 1 tablet by mouth daily.    Historical Provider, MD   omeprazole (PRILOSEC) 20 MG capsule Take 1 capsule (20 mg total) by mouth daily. 09/20/12   Nimish Luther Parody, MD  Oxycodone HCl 10 MG TABS Take 10 mg by mouth every 6 (six) hours as needed (for pain).    Historical Provider, MD  potassium chloride SA (K-DUR,KLOR-CON) 20 MEQ tablet Take 1 tablet (20 mEq total) by mouth daily. 09/20/12   Nimish Luther Parody, MD  rifaximin (XIFAXAN) 550 MG TABS tablet Take 1 tablet (550 mg total) by mouth 2 (two) times daily. 10/25/13   Radene Gunning, NP   BP 167/74  Pulse 62  Temp(Src) 98.6 F (37 C) (Oral)  Resp  16  SpO2 100% Physical Exam  Constitutional: He appears well-developed and well-nourished. No distress.  Dysmorphic  HENT:  Head: Normocephalic.  Eyes: EOM are normal. Pupils are equal, round, and reactive to light. No scleral icterus.  Neck: Neck supple.  Cardiovascular: Normal rate, regular rhythm and normal heart sounds.  Exam reveals no gallop and no friction rub.   No murmur heard. Pulmonary/Chest: Effort normal and breath sounds normal. He has no wheezes. He has no rales.  Abdominal: There is no tenderness. There is no rebound and no guarding.  Musculoskeletal: Normal range of motion. He exhibits edema. He exhibits no tenderness.  Lower extremity edema    Neurological: He is alert. No cranial nerve deficit. He exhibits normal muscle tone. Coordination normal.  Skin: Skin is warm and dry. No rash noted.  No jaundice     ED Course  Procedures (including critical care time) DIAGNOSTIC STUDIES: Oxygen Saturation is 100% on room air, normal by my interpretation.    COORDINATION OF CARE:  3:36 PM Discussed course of care with pt . Pt understands and agrees.   Labs Review Labs Reviewed  CBC WITH DIFFERENTIAL - Abnormal; Notable for the following:    WBC 3.1 (*)    RBC 3.78 (*)    Hemoglobin 10.7 (*)    HCT 34.0 (*)    RDW 17.9 (*)    Platelets 65 (*)    Neutro Abs 1.4 (*)    Monocytes Relative 14 (*)    All other components within  normal limits  COMPREHENSIVE METABOLIC PANEL - Abnormal; Notable for the following:    Albumin 3.0 (*)    AST 57 (*)    Total Bilirubin 1.9 (*)    All other components within normal limits  PROTIME-INR - Abnormal; Notable for the following:    Prothrombin Time 18.3 (*)    INR 1.57 (*)    All other components within normal limits  AMMONIA - Abnormal; Notable for the following:    Ammonia 75 (*)    All other components within normal limits  LIPASE, BLOOD  PRO B NATRIURETIC PEPTIDE  LACTIC ACID, PLASMA  URINALYSIS, ROUTINE W REFLEX MICROSCOPIC    Imaging Review Dg Chest 2 View  01/03/2014   CLINICAL DATA:  Chest discomfort, COPD.  EXAM: CHEST  2 VIEW  COMPARISON:  DG CHEST 1V PORT dated 10/23/2013  FINDINGS: The cardiac silhouette appears mildly enlarged, similar. Mediastinal silhouette is nonsuspicious. No pleural effusions or focal consolidations. No pneumothorax.  Buckling of the lateral right eighth rib cortex. Soft tissue planes are nonsuspicious.  IMPRESSION: Mild cardiomegaly, no acute pulmonary process.  Buckling of the right lateral eighth rib cortex, equivocal for acute injury, recommend correlation with point tenderness.   Electronically Signed   By: Elon Alas   On: 01/03/2014 16:06   After the initial evaluation I discussed patient's case with social work, we'll consult with patient and his wife. MDM   I personally performed the services described in this documentation, which was scribed in my presence. The recorded information has been reviewed and is accurate.  This patient with multiple medical problems, including encephalopathy, cirrhosis, now presents with with confusion, somnolence.  Patient awake, alert, moving all extremities spontaneously, in no distress.  He is hemodynamically stable, and labs are consistent with multiple prior evaluations. There is no indication for admission, but after discussion with the patient and his wife I discussed patient's case with  social work to facilitate placement into a nursing home.  Carmin Muskrat, MD 01/03/14 [phone]

## 2014-01-03 NOTE — ED Notes (Signed)
Pt attempted to void but not able to at this time. EDP aware. No new orders given at this time.

## 2014-01-03 NOTE — Discharge Instructions (Signed)
Please be sure to take advantage of the additional home health assistance, and follow up with your physician to facilitate placement into a nursing facility.  Return here for concerning changes in his condition.

## 2014-01-03 NOTE — Care Management Note (Signed)
Per MD request - spoke with pt and spouse, [place]. She is very tired, and they are looking to place pt for short term rehab, but would like him to stay 3-6 months. Mrs Vickers has spoken with someone in admissions at [facility], and apparently they are willing to take him and apply for Medicaid for his as apparently they did before. Spouse says he stayed 6 months once before. Discussed that if pt is admitted he can be placed from hospital as an inpt or OBV pt if insurance approves. If he is not admitted they can have home health to visit and assist with care while [facility] social worker can with assist  with placement from home. Their insurance does not require a 3 day hospital stay, but may require a physical  therapy consult. They are agreeable with this and chose [facility] for [facility] services. Referral to Romualdo Bolk, RN. Message left and will check in am to see if pt is admitted or D/C home from ED, and call again to [facility] if D/C home.

## 2014-02-14 ENCOUNTER — Encounter (HOSPITAL_COMMUNITY): Payer: Self-pay | Admitting: Emergency Medicine

## 2014-02-14 ENCOUNTER — Emergency Department (HOSPITAL_COMMUNITY)
Admission: EM | Admit: 2014-02-14 | Discharge: 2014-02-14 | Disposition: A | Discharge status: Home / Self Care | Payer: Medicare Other | Attending: Emergency Medicine | Admitting: Emergency Medicine

## 2014-02-14 DIAGNOSIS — M129 Arthropathy, unspecified: Secondary | ICD-10-CM | POA: Insufficient documentation

## 2014-02-14 DIAGNOSIS — G40909 Epilepsy, unspecified, not intractable, without status epilepticus: Secondary | ICD-10-CM | POA: Insufficient documentation

## 2014-02-14 DIAGNOSIS — F17200 Nicotine dependence, unspecified, uncomplicated: Secondary | ICD-10-CM | POA: Insufficient documentation

## 2014-02-14 DIAGNOSIS — I10 Essential (primary) hypertension: Secondary | ICD-10-CM | POA: Insufficient documentation

## 2014-02-14 DIAGNOSIS — G8929 Other chronic pain: Secondary | ICD-10-CM | POA: Insufficient documentation

## 2014-02-14 DIAGNOSIS — H5440 Blindness, one eye, unspecified eye: Secondary | ICD-10-CM | POA: Insufficient documentation

## 2014-02-14 DIAGNOSIS — K219 Gastro-esophageal reflux disease without esophagitis: Secondary | ICD-10-CM | POA: Insufficient documentation

## 2014-02-14 DIAGNOSIS — IMO0002 Reserved for concepts with insufficient information to code with codable children: Secondary | ICD-10-CM | POA: Insufficient documentation

## 2014-02-14 DIAGNOSIS — J449 Chronic obstructive pulmonary disease, unspecified: Secondary | ICD-10-CM | POA: Insufficient documentation

## 2014-02-14 DIAGNOSIS — Z862 Personal history of diseases of the blood and blood-forming organs and certain disorders involving the immune mechanism: Secondary | ICD-10-CM | POA: Insufficient documentation

## 2014-02-14 DIAGNOSIS — Z7982 Long term (current) use of aspirin: Secondary | ICD-10-CM | POA: Insufficient documentation

## 2014-02-14 DIAGNOSIS — Z880 Allergy status to penicillin: Secondary | ICD-10-CM | POA: Insufficient documentation

## 2014-02-14 DIAGNOSIS — J4489 Other specified chronic obstructive pulmonary disease: Secondary | ICD-10-CM | POA: Insufficient documentation

## 2014-02-14 DIAGNOSIS — F411 Generalized anxiety disorder: Secondary | ICD-10-CM | POA: Insufficient documentation

## 2014-02-14 DIAGNOSIS — Z8673 Personal history of transient ischemic attack (TIA), and cerebral infarction without residual deficits: Secondary | ICD-10-CM | POA: Insufficient documentation

## 2014-02-14 DIAGNOSIS — Z79899 Other long term (current) drug therapy: Secondary | ICD-10-CM | POA: Insufficient documentation

## 2014-02-14 DIAGNOSIS — F319 Bipolar disorder, unspecified: Secondary | ICD-10-CM | POA: Insufficient documentation

## 2014-02-14 DIAGNOSIS — H9190 Unspecified hearing loss, unspecified ear: Secondary | ICD-10-CM | POA: Insufficient documentation

## 2014-02-14 DIAGNOSIS — E119 Type 2 diabetes mellitus without complications: Secondary | ICD-10-CM | POA: Insufficient documentation

## 2014-02-14 DIAGNOSIS — R1011 Right upper quadrant pain: Secondary | ICD-10-CM | POA: Insufficient documentation

## 2014-02-14 DIAGNOSIS — R451 Restlessness and agitation: Secondary | ICD-10-CM

## 2014-02-14 LAB — CBC WITH DIFFERENTIAL/PLATELET
Basophils Absolute: 0 10*3/uL (ref 0.0–0.1)
Basophils Relative: 2 % — ABNORMAL HIGH (ref 0–1)
EOS ABS: 0.1 10*3/uL (ref 0.0–0.7)
Eosinophils Relative: 3 % (ref 0–5)
HCT: 34 % — ABNORMAL LOW (ref 39.0–52.0)
HEMOGLOBIN: 11 g/dL — AB (ref 13.0–17.0)
Lymphocytes Relative: 47 % — ABNORMAL HIGH (ref 12–46)
Lymphs Abs: 0.9 10*3/uL (ref 0.7–4.0)
MCH: 29.4 pg (ref 26.0–34.0)
MCHC: 32.4 g/dL (ref 30.0–36.0)
MCV: 90.9 fL (ref 78.0–100.0)
Monocytes Absolute: 0.3 10*3/uL (ref 0.1–1.0)
Monocytes Relative: 14 % — ABNORMAL HIGH (ref 3–12)
NEUTROS ABS: 0.6 10*3/uL — AB (ref 1.7–7.7)
Neutrophils Relative %: 34 % — ABNORMAL LOW (ref 43–77)
Platelets: DECREASED 10*3/uL (ref 150–400)
RBC: 3.74 MIL/uL — ABNORMAL LOW (ref 4.22–5.81)
RDW: 18.2 % — ABNORMAL HIGH (ref 11.5–15.5)
Smear Review: DECREASED
WBC: 1.9 10*3/uL — ABNORMAL LOW (ref 4.0–10.5)

## 2014-02-14 LAB — COMPREHENSIVE METABOLIC PANEL
ALT: 14 U/L (ref 0–53)
AST: 37 U/L (ref 0–37)
Albumin: 2.7 g/dL — ABNORMAL LOW (ref 3.5–5.2)
Alkaline Phosphatase: 120 U/L — ABNORMAL HIGH (ref 39–117)
BILIRUBIN TOTAL: 1.5 mg/dL — AB (ref 0.3–1.2)
BUN: 10 mg/dL (ref 6–23)
CO2: 28 meq/L (ref 19–32)
Calcium: 8.6 mg/dL (ref 8.4–10.5)
Chloride: 103 mEq/L (ref 96–112)
Creatinine, Ser: 0.76 mg/dL (ref 0.50–1.35)
GFR calc Af Amer: >90 mL/min (ref >90)
Glucose, Bld: 81 mg/dL (ref 70–99)
Potassium: 3.8 mEq/L (ref 3.7–5.3)
Sodium: 141 mEq/L (ref 137–147)
Total Protein: 6.1 g/dL (ref 6.0–8.3)

## 2014-02-14 LAB — AMMONIA: Ammonia: 101 umol/L — ABNORMAL HIGH (ref 11–60)

## 2014-02-14 NOTE — ED Notes (Signed)
EMS called for unruly, aggressive patient. EMS reports when they arrived pt was sitting in chair, calm and cooperative. He stated he was mad at something which was said to him, EMS reports pt has been cooperative since their arrival. Lab values drawn 02/14/14 abnormal including WBC 2.0, PLTS 49 and Ammonia 122.

## 2014-02-14 NOTE — ED Notes (Signed)
Gave report to staff at [facility] facility.

## 2014-02-14 NOTE — Discharge Instructions (Signed)
Continue taking all her medications, as prescribed.  Drink plenty of water.  He will need to followup with her doctor to discuss your white blood cell count and platelet count. Both of these, were low tonight.

## 2014-02-14 NOTE — ED Notes (Signed)
Confirmed Lactulose dosage and verified pt was taking his medications with staff at [facility]. EDP updated.

## 2014-02-14 NOTE — ED Provider Notes (Signed)
CSN: [id number]     Arrival date & time 02/14/14  1827 History   First MD Initiated Contact with Patient 02/14/14 1942     Chief Complaint  Patient presents with  . abnormal labs      (Consider location/radiation/quality/duration/timing/severity/associated sxs/prior Treatment) HPI  The patient was sent here for evaluation of abnormal ammonia, platelets, and white blood cell count. These were reported to be 122, 49, 2, respectively. The patient is unable to give a history. The patient was agitated tonight and was treated with Ativan and Haldol, with reported improvement by the caregivers at his nursing care facility. He is reported to be taking his medication, as prescribed.  Level V caveat: Confusion   Past Medical History  Diagnosis Date  . Depression   . 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 Bipolar 1 disorder   . Hypertension   . Diabetes mellitus   . Seizures   . COPD (chronic obstructive pulmonary disease)   . Mental retardation   . Blind right eye   . GERD (gastroesophageal reflux disease)   . Chronic back pain   . Hepatic encephalopathy   . Anemia   . Thrombocytopenia   . Cardiomegaly   . Anxiety   . Arthritis   . Cholelithiasis     Asymptomatic  . Cerebrovascular disease     Right vertebral artery  . Hard of hearing   . Hyperlipidemia   . Cataract     Right eye  . Blood clots in brain     per patient  . Cirrhosis     diagnosed 07/2010 when presented with first episode of hepatic encephalopathy, afp on 416/13=4.8,  U/S on 12/14/11  liver stable, pt has received 2 hep A/B vaccines  . Diverticulosis    Past Surgical History  Procedure Laterality Date  . Tonsillectomy    . Ear operations    . Cast application  10/02/4763    Procedure: MINOR CAST APPLICATION;  Surgeon: [name], MD;  Location: [facility];  Service: Orthopedics;  Laterality: Right;  no anesthesia , procedure room do not need or room !  . Orif ankle fracture  08/27/2011    Procedure: OPEN REDUCTION  INTERNAL FIXATION (ORIF) ANKLE FRACTURE;  Surgeon: [name], MD;  Location: [facility];  Service: Orthopedics;  Laterality: Right;  . Esophagogastroduodenoscopy  02/02/2011    Rourk-Normal esophagus.  No varices/Question mild portal gastropathy, extrinsic compression on the antrum, lesser curvature of uncertain significance, some minimally  nodular mucosa status post biopsy, patent pylorus, normal duodenum 1 and duodenum 2.  . Colonoscopy  12/15/11    colonic divericulosis;poor prep, ACBE  recommended but has not been done  . Esophagogastroduodenoscopy  12/15/11    Rourk-->mild changes of portal gastropathy, gastric/duodenal erosions s/p bx  (reactive changes with mild chronic inflammation. No H.pylori  . Foot surgery     Family History  Problem Relation Age of Onset  . Heart failure Mother   . Thyroid disease Mother   . Cancer Father   . Asthma Brother   . Heart attack Brother   . Cirrhosis Brother     EtOH  . Colon cancer Maternal Aunt    History  Substance Use Topics  . Smoking status: Current Every Day Smoker -- 1.00 packs/day for 30 years    Types: Cigarettes  . Smokeless tobacco: Not on file     Comment: only smokes about 1-2 a day  . Alcohol Use: No    Review of  Systems  Unable to perform ROS     Allergies  Penicillins  Home Medications   Prior to Admission medications   Medication Sig Start Date End Date Taking? Authorizing Provider  ALPRAZolam Duanne Moron) 0.5 MG tablet Take 0.5 mg by mouth 3 (three) times daily as needed for anxiety.    Historical Provider, MD  aspirin EC 81 MG tablet Take 81 mg by mouth daily.    Historical Provider, MD  Cholecalciferol (VITAMIN D) 2000 UNITS tablet Take 1 tablet (2,000 Units total) by mouth daily. 09/20/12   Nimish Luther Parody, MD  fish oil-omega-3 fatty acids 1000 MG capsule Take 1 g by mouth 3 (three) times daily.    Historical Provider, MD  FLUoxetine (PROZAC) 40 MG capsule Take 40 mg by mouth daily. 10/26/13   Historical  Provider, MD  furosemide (LASIX) 20 MG tablet Take 20 mg by mouth daily.    Historical Provider, MD  glimepiride (AMARYL) 2 MG tablet Take 2 mg by mouth daily before breakfast.    Historical Provider, MD  HYDROcodone-acetaminophen (NORCO/VICODIN) 5-325 MG per tablet Take 1 tablet by mouth every 4 (four) hours as needed for moderate pain.  10/26/13 10/26/14  Historical Provider, MD  lactulose (CHRONULAC) 10 GM/15ML solution Take 60 mLs (40 g total) by mouth 3 (three) times daily. Titrate for 3-4 bowel motions daily. 01/03/14   Carmin Muskrat, MD  levETIRAcetam (KEPPRA) 500 MG tablet Take 500 mg by mouth 2 (two) times daily.    Historical Provider, MD  lidocaine (XYLOCAINE) 2 % jelly Apply 1 application topically as needed. Irritation/pain    Historical Provider, MD  metoprolol tartrate (LOPRESSOR) 25 MG tablet Take 25-50 mg by mouth 2 (two) times daily. Take 25 mg in the morning and 50 mg at night    Historical Provider, MD  Multiple Vitamin (MULTIVITAMIN WITH MINERALS) TABS Take 1 tablet by mouth daily.    Historical Provider, MD  omeprazole (PRILOSEC) 20 MG capsule Take 1 capsule (20 mg total) by mouth daily. 09/20/12   Nimish Luther Parody, MD  potassium chloride SA (K-DUR,KLOR-CON) 20 MEQ tablet Take 1 tablet (20 mEq total) by mouth daily. 09/20/12   Nimish Luther Parody, MD   BP 129/68  Pulse 74  Temp(Src) 97.8 F (36.6 C) (Oral)  Ht 5' 7"  (1.702 m)  Wt 160 lb (72.576 kg)  BMI 25.05 kg/m2  SpO2 97% Physical Exam  Nursing note and vitals reviewed. Constitutional: He appears well-developed.  Appears older than stated age  HENT:  Head: Normocephalic and atraumatic.  Right Ear: External ear normal.  Left Ear: External ear normal.  Eyes: Conjunctivae and EOM are normal. Pupils are equal, round, and reactive to light.  Neck: Normal range of motion and phonation normal. Neck supple.  Cardiovascular: Normal rate, regular rhythm, normal heart sounds and intact distal pulses.   Pulmonary/Chest: Effort  normal and breath sounds normal. He exhibits no bony tenderness.  Abdominal: Soft. There is tenderness (right upper quadrant, mild, without palpable mass.).  Musculoskeletal: Normal range of motion. He exhibits edema (1+, bilaterally).  Neurological: He is alert. No cranial nerve deficit or sensory deficit. He exhibits normal muscle tone. Coordination normal.  He is alert, cooperative, and follows commands  Skin: Skin is warm, dry and intact.  Psychiatric: He has a normal mood and affect. His behavior is normal.  No agitation or psychosis    ED Course  Procedures (including critical care time) Medications - No data to display  Patient Vitals for the past 24  hrs:  BP Temp Temp src Pulse SpO2 Height Weight  02/14/14 1830 129/68 mmHg 97.8 F (36.6 C) Oral 74 97 % 5' 7"  (1.702 m) 160 lb (72.576 kg)    10:27 PM Reevaluation with update and discussion. After initial assessment and treatment, an updated evaluation reveals he remains calm, and comfortable.Daleen Bo L     Labs Review Labs Reviewed  CBC WITH DIFFERENTIAL - Abnormal; Notable for the following:    WBC 1.9 (*)    RBC 3.74 (*)    Hemoglobin 11.0 (*)    HCT 34.0 (*)    RDW 18.2 (*)    Neutrophils Relative % 34 (*)    Lymphocytes Relative 47 (*)    Monocytes Relative 14 (*)    Basophils Relative 2 (*)    Neutro Abs 0.6 (*)    All other components within normal limits  COMPREHENSIVE METABOLIC PANEL - Abnormal; Notable for the following:    Albumin 2.7 (*)    Alkaline Phosphatase 120 (*)    Total Bilirubin 1.5 (*)    All other components within normal limits  AMMONIA - Abnormal; Notable for the following:    Ammonia 101 (*)    All other components within normal limits    Imaging Review No results found.   EKG Interpretation None      MDM   Final diagnoses:  Agitation    Agitation, transient, with mildly elevated, but improved ammonia level as compared to earlier today. Is on maximum dose lactulose.  He stable for discharge with current treatment program. The low white blood cell count and platelet counts are nonspecific and can be followed up, as an outpatient.  Nursing Notes Reviewed/ Care Coordinated Applicable Imaging Reviewed Interpretation of Laboratory Data incorporated into ED treatment  The patient appears reasonably screened and/or stabilized for discharge and I doubt any other medical condition or other [facility] requiring further screening, evaluation, or treatment in the ED at this time prior to discharge.  Plan: Home Medications- usual; Home Treatments- rest; return here if the recommended treatment, does not improve the symptoms; Recommended follow up- PCP 2-3 days  Richarda Blade, MD 02/14/14 2228

## 2014-11-18 ENCOUNTER — Telehealth: Payer: Self-pay | Admitting: Orthopedic Surgery

## 2014-11-18 NOTE — Telephone Encounter (Signed)
Call was received per Vicente Males, from Wound center, affiliate of [facility], [place] [phone] -- requesting appointment for patient/resident, for evaluation of wound and possible hardware exposed, per house physician.   I relayed to her that Dr Aline Brochure is out of office this week; therefore, no immediate appointment available; in addition, relayed to her that Dr Aline Brochure would need to review notes and any recent Xray reports/films prior to scheduling.    Our notes indicate that patient has not been seen since 04/26/12.  He had also been referred to [facility] in 2013.  I have called this office and was told that he had a scheduled appointment but had cancelled it and had not re-scheduled.  Awaiting notes, which are to be faxed, as noted.

## 2014-11-21 NOTE — Telephone Encounter (Signed)
Received 1 page, copy of Xray report, date of service, 11/20/14.  In Dr Ruthe Mannan box for review and advice regarding scheduling, per noted dated 11/18/14(Dr. Aline Brochure out of office) - call received from facility requesting appointment.

## 2014-11-25 NOTE — Telephone Encounter (Signed)
Monday 4-11 in the afternoon will need xrays if not done

## 2014-11-25 NOTE — Telephone Encounter (Signed)
Called facility to relay this appointment information; spoke with Anderson Malta, floor nurse, who relayed that nursing supervisor and other administrative staff have left for the day; states to call back in the morning; ask for [employer].

## 2014-11-26 NOTE — Telephone Encounter (Signed)
Appointment scheduled as approved by Dr Aline Brochure, per [facility], nursing supervisor, Barkley Boards, ph# [phone]; forms faxed to [phone]/alternate# [phone], to attention: Crystal and Vicente Males (transportation); aware to have Xrays prior, at [facility].

## 2014-12-02 ENCOUNTER — Ambulatory Visit: Payer: Self-pay | Admitting: Orthopedic Surgery

## 2014-12-02 ENCOUNTER — Other Ambulatory Visit (HOSPITAL_COMMUNITY): Payer: Self-pay | Admitting: Endocrinology

## 2014-12-02 ENCOUNTER — Ambulatory Visit (HOSPITAL_COMMUNITY)
Admission: RE | Admit: 2014-12-02 | Discharge: 2014-12-02 | Disposition: A | Discharge status: Home / Self Care | Payer: Medicare Other | Source: Ambulatory Visit | Attending: Internal Medicine | Admitting: Internal Medicine

## 2014-12-02 ENCOUNTER — Other Ambulatory Visit: Payer: Self-pay | Admitting: Internal Medicine

## 2014-12-02 DIAGNOSIS — M25471 Effusion, right ankle: Secondary | ICD-10-CM | POA: Diagnosis not present

## 2014-12-02 DIAGNOSIS — M25571 Pain in right ankle and joints of right foot: Secondary | ICD-10-CM | POA: Insufficient documentation

## 2014-12-02 DIAGNOSIS — R937 Abnormal findings on diagnostic imaging of other parts of musculoskeletal system: Secondary | ICD-10-CM | POA: Insufficient documentation

## 2014-12-02 DIAGNOSIS — Z09 Encounter for follow-up examination after completed treatment for conditions other than malignant neoplasm: Secondary | ICD-10-CM

## 2014-12-02 DIAGNOSIS — R52 Pain, unspecified: Secondary | ICD-10-CM

## 2014-12-04 ENCOUNTER — Ambulatory Visit (INDEPENDENT_AMBULATORY_CARE_PROVIDER_SITE_OTHER): Payer: Medicare Other | Admitting: Orthopedic Surgery

## 2014-12-04 ENCOUNTER — Encounter: Payer: Self-pay | Admitting: Orthopedic Surgery

## 2014-12-04 VITALS — BP 118/65 | Ht 67.0 in

## 2014-12-04 DIAGNOSIS — S82841P Displaced bimalleolar fracture of right lower leg, subsequent encounter for closed fracture with malunion: Secondary | ICD-10-CM

## 2014-12-04 NOTE — Patient Instructions (Signed)
Referral to wound center

## 2014-12-04 NOTE — Progress Notes (Signed)
Patient ID: [name] [name], male   DOB: 08-05-1961, 54 y.o.   MRN: [id number] Chief Complaint  Patient presents with  . Ankle Problem    Right ankle wound, possible hardware protrusion, DOS 09/08/11    This 54 year old male had open treatment internal fixation of a right ankle fracture several years ago developed complications and was sent to a foot and ankle specialist for possible fusion versus amputation but declined. He says he did well over the last 3 years until about 8 weeks ago when he started having a wound problem which eventually progressed to a 7 x 8 cm wound full-thickness down to metal which was treated with antibiotics and dressing changes. He presents for evaluation now that the hardware is visible. He complains of severe pain but he does have chronic pain he says the Percocet does not help him. His wound is dressed with a dressing it is a large wound is a deep wound a screw is protruding and I removed.  He has multiple problems including but not limited to his diabetes  He has a history of fever constipation diarrhea and nausea vomiting irregular heartbeat chest pain wheezing weakness burning pain in his legs and frequent urination  Allergic to penicillin  Family history diabetes asthma COPD emphysema heart attack CHF depression cancer arthritis and osteoporosis. 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 There is an odor to the wound. He is confined at least today to the wheelchair. His vital signs are  BP 118/65 mmHg  Ht 5' 7"  (1.702 m)  78 18 respiratory rate  He has palpable sensation of pressure and pain on the foot plantar and dorsal aspect. 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  I've recommended after changing the dressing that he get 3 times a day dressing changes. I've referred him to the [facility] wound care center where he may benefit from hyperbaric oxygen and/or wound VAC I've also reminded him that we told him when we last saw him that he would either need an ankle fusion or possible amputation secondary to his proprioceptive dysfunction and diabetes and deformity He should have wound culture and appropriate antibiotics

## 2014-12-09 ENCOUNTER — Telehealth: Payer: Self-pay | Admitting: *Deleted

## 2014-12-09 ENCOUNTER — Other Ambulatory Visit: Payer: Self-pay | Admitting: *Deleted

## 2014-12-09 DIAGNOSIS — S91001D Unspecified open wound, right ankle, subsequent encounter: Secondary | ICD-10-CM

## 2014-12-09 NOTE — Telephone Encounter (Signed)
REFERRAL FAXED TO [facility]

## 2014-12-17 NOTE — Telephone Encounter (Signed)
CALLED TO FOLLOW UP ON REFERRAL AND [facility] HAS MADE MULTIPLE ATTEMPTS TO REACH [facility] TO SCHEDULE

## 2014-12-18 NOTE — Telephone Encounter (Signed)
appt Dec 30, 2014 @ [place]

## 2014-12-24 ENCOUNTER — Ambulatory Visit: Payer: Self-pay | Admitting: Orthopedic Surgery

## 2015-02-24 ENCOUNTER — Other Ambulatory Visit (HOSPITAL_COMMUNITY)
Admission: RE | Admit: 2015-02-24 | Discharge: 2015-02-24 | Disposition: A | Discharge status: Home / Self Care | Payer: Medicare Other | Source: Other Acute Inpatient Hospital | Attending: Infectious Diseases | Admitting: Infectious Diseases

## 2015-02-24 DIAGNOSIS — L89504 Pressure ulcer of unspecified ankle, stage 4: Secondary | ICD-10-CM | POA: Insufficient documentation

## 2015-02-24 DIAGNOSIS — E11622 Type 2 diabetes mellitus with other skin ulcer: Secondary | ICD-10-CM | POA: Insufficient documentation

## 2015-02-24 DIAGNOSIS — Z5181 Encounter for therapeutic drug level monitoring: Secondary | ICD-10-CM | POA: Insufficient documentation

## 2015-02-24 DIAGNOSIS — A4901 Methicillin susceptible Staphylococcus aureus infection, unspecified site: Secondary | ICD-10-CM | POA: Insufficient documentation

## 2015-02-24 LAB — CBC WITH DIFFERENTIAL/PLATELET
BASOS PCT: 1 % (ref 0–1)
Basophils Absolute: 0 10*3/uL (ref 0.0–0.1)
EOS ABS: 0.4 10*3/uL (ref 0.0–0.7)
EOS PCT: 11 % — AB (ref 0–5)
HCT: 30.2 % — ABNORMAL LOW (ref 39.0–52.0)
Hemoglobin: 9.1 g/dL — ABNORMAL LOW (ref 13.0–17.0)
LYMPHS ABS: 1.1 10*3/uL (ref 0.7–4.0)
LYMPHS PCT: 30 % (ref 12–46)
MCH: 26.4 pg (ref 26.0–34.0)
MCHC: 30.1 g/dL (ref 30.0–36.0)
MCV: 87.5 fL (ref 78.0–100.0)
Monocytes Absolute: 0.5 10*3/uL (ref 0.1–1.0)
Monocytes Relative: 16 % — ABNORMAL HIGH (ref 3–12)
NEUTROS ABS: 1.5 10*3/uL — AB (ref 1.7–7.7)
NEUTROS PCT: 42 % — AB (ref 43–77)
PLATELETS: 94 10*3/uL — AB (ref 150–400)
RBC: 3.45 MIL/uL — ABNORMAL LOW (ref 4.22–5.81)
RDW: 16.3 % — ABNORMAL HIGH (ref 11.5–15.5)
WBC: 3.5 10*3/uL — ABNORMAL LOW (ref 4.0–10.5)

## 2015-02-24 LAB — COMPREHENSIVE METABOLIC PANEL
ALK PHOS: 89 U/L (ref 38–126)
ALT: 32 U/L (ref 17–63)
AST: 46 U/L — ABNORMAL HIGH (ref 15–41)
Albumin: 2.9 g/dL — ABNORMAL LOW (ref 3.5–5.0)
Anion gap: 7 (ref 5–15)
BUN: 10 mg/dL (ref 6–20)
CO2: 29 mmol/L (ref 22–32)
Calcium: 8.6 mg/dL — ABNORMAL LOW (ref 8.9–10.3)
Chloride: 102 mmol/L (ref 101–111)
Creatinine, Ser: 0.67 mg/dL (ref 0.61–1.24)
GFR calc Af Amer: >60 mL/min (ref >60)
Glucose, Bld: 131 mg/dL — ABNORMAL HIGH (ref 65–99)
Potassium: 4.1 mmol/L (ref 3.5–5.1)
Sodium: 138 mmol/L (ref 135–145)
Total Bilirubin: 1.3 mg/dL — ABNORMAL HIGH (ref 0.3–1.2)
Total Protein: 6.2 g/dL — ABNORMAL LOW (ref 6.5–8.1)

## 2015-02-24 LAB — SEDIMENTATION RATE: Sed Rate: 10 mm/hr (ref 0–16)

## 2015-02-24 LAB — C-REACTIVE PROTEIN: CRP: <0.5 mg/dL (ref <1.0)

## 2015-02-24 LAB — VANCOMYCIN, TROUGH: VANCOMYCIN TR: 10 ug/mL (ref 10.0–20.0)

## 2015-03-02 ENCOUNTER — Other Ambulatory Visit (HOSPITAL_COMMUNITY)
Admission: RE | Admit: 2015-03-02 | Discharge: 2015-03-02 | Disposition: A | Discharge status: Home / Self Care | Payer: Medicare Other | Source: Other Acute Inpatient Hospital | Attending: Infectious Diseases | Admitting: Infectious Diseases

## 2015-03-02 DIAGNOSIS — Z5181 Encounter for therapeutic drug level monitoring: Secondary | ICD-10-CM | POA: Insufficient documentation

## 2015-03-02 DIAGNOSIS — E11622 Type 2 diabetes mellitus with other skin ulcer: Secondary | ICD-10-CM | POA: Insufficient documentation

## 2015-03-02 DIAGNOSIS — L89504 Pressure ulcer of unspecified ankle, stage 4: Secondary | ICD-10-CM | POA: Insufficient documentation

## 2015-03-02 DIAGNOSIS — A4901 Methicillin susceptible Staphylococcus aureus infection, unspecified site: Secondary | ICD-10-CM | POA: Insufficient documentation

## 2015-03-02 LAB — CBC WITH DIFFERENTIAL/PLATELET
Basophils Absolute: 0 10*3/uL (ref 0.0–0.1)
Basophils Relative: 2 % — ABNORMAL HIGH (ref 0–1)
EOS ABS: 0.2 10*3/uL (ref 0.0–0.7)
EOS PCT: 9 % — AB (ref 0–5)
HEMATOCRIT: 28.4 % — AB (ref 39.0–52.0)
HEMOGLOBIN: 8.5 g/dL — AB (ref 13.0–17.0)
Lymphocytes Relative: 39 % (ref 12–46)
Lymphs Abs: 0.9 10*3/uL (ref 0.7–4.0)
MCH: 26.2 pg (ref 26.0–34.0)
MCHC: 29.9 g/dL — AB (ref 30.0–36.0)
MCV: 87.4 fL (ref 78.0–100.0)
MONO ABS: 0.3 10*3/uL (ref 0.1–1.0)
Monocytes Relative: 13 % — ABNORMAL HIGH (ref 3–12)
NEUTROS ABS: 0.9 10*3/uL — AB (ref 1.7–7.7)
Neutrophils Relative %: 37 % — ABNORMAL LOW (ref 43–77)
Platelets: 55 10*3/uL — ABNORMAL LOW (ref 150–400)
RBC: 3.25 MIL/uL — AB (ref 4.22–5.81)
RDW: 16.3 % — ABNORMAL HIGH (ref 11.5–15.5)
WBC: 2.3 10*3/uL — ABNORMAL LOW (ref 4.0–10.5)

## 2015-03-02 LAB — COMPREHENSIVE METABOLIC PANEL
ALK PHOS: 86 U/L (ref 38–126)
ALT: 16 U/L — ABNORMAL LOW (ref 17–63)
ANION GAP: 6 (ref 5–15)
AST: 34 U/L (ref 15–41)
Albumin: 2.7 g/dL — ABNORMAL LOW (ref 3.5–5.0)
BUN: 10 mg/dL (ref 6–20)
CHLORIDE: 103 mmol/L (ref 101–111)
CO2: 29 mmol/L (ref 22–32)
CREATININE: 1.04 mg/dL (ref 0.61–1.24)
Calcium: 8.1 mg/dL — ABNORMAL LOW (ref 8.9–10.3)
GFR calc Af Amer: >60 mL/min (ref >60)
Glucose, Bld: 151 mg/dL — ABNORMAL HIGH (ref 65–99)
POTASSIUM: 3.9 mmol/L (ref 3.5–5.1)
Sodium: 138 mmol/L (ref 135–145)
Total Bilirubin: 0.9 mg/dL (ref 0.3–1.2)
Total Protein: 5.8 g/dL — ABNORMAL LOW (ref 6.5–8.1)

## 2015-03-02 LAB — VANCOMYCIN, TROUGH: Vancomycin Tr: <4 ug/mL — ABNORMAL LOW (ref 10.0–20.0)

## 2015-03-02 LAB — C-REACTIVE PROTEIN: CRP: <0.5 mg/dL (ref <1.0)

## 2015-03-02 LAB — SEDIMENTATION RATE: Sed Rate: 18 mm/hr — ABNORMAL HIGH (ref 0–16)

## 2015-11-23 ENCOUNTER — Emergency Department (HOSPITAL_COMMUNITY): Payer: Medicare HMO

## 2015-11-23 ENCOUNTER — Emergency Department (HOSPITAL_COMMUNITY)
Admission: EM | Admit: 2015-11-23 | Discharge: 2015-11-23 | Disposition: A | Discharge status: Home / Self Care | Payer: Medicare HMO | Attending: Emergency Medicine | Admitting: Emergency Medicine

## 2015-11-23 ENCOUNTER — Encounter (HOSPITAL_COMMUNITY): Payer: Self-pay

## 2015-11-23 DIAGNOSIS — R4182 Altered mental status, unspecified: Secondary | ICD-10-CM | POA: Diagnosis not present

## 2015-11-23 DIAGNOSIS — E785 Hyperlipidemia, unspecified: Secondary | ICD-10-CM | POA: Insufficient documentation

## 2015-11-23 DIAGNOSIS — Z7984 Long term (current) use of oral hypoglycemic drugs: Secondary | ICD-10-CM | POA: Diagnosis not present

## 2015-11-23 DIAGNOSIS — F329 Major depressive disorder, single episode, unspecified: Secondary | ICD-10-CM | POA: Insufficient documentation

## 2015-11-23 DIAGNOSIS — E7220 Disorder of urea cycle metabolism, unspecified: Secondary | ICD-10-CM | POA: Diagnosis not present

## 2015-11-23 DIAGNOSIS — F17210 Nicotine dependence, cigarettes, uncomplicated: Secondary | ICD-10-CM | POA: Diagnosis not present

## 2015-11-23 DIAGNOSIS — I10 Essential (primary) hypertension: Secondary | ICD-10-CM | POA: Diagnosis not present

## 2015-11-23 DIAGNOSIS — E11649 Type 2 diabetes mellitus with hypoglycemia without coma: Secondary | ICD-10-CM | POA: Insufficient documentation

## 2015-11-23 DIAGNOSIS — M1990 Unspecified osteoarthritis, unspecified site: Secondary | ICD-10-CM | POA: Insufficient documentation

## 2015-11-23 DIAGNOSIS — Z7982 Long term (current) use of aspirin: Secondary | ICD-10-CM | POA: Diagnosis not present

## 2015-11-23 DIAGNOSIS — G40909 Epilepsy, unspecified, not intractable, without status epilepticus: Secondary | ICD-10-CM | POA: Insufficient documentation

## 2015-11-23 DIAGNOSIS — J449 Chronic obstructive pulmonary disease, unspecified: Secondary | ICD-10-CM | POA: Diagnosis not present

## 2015-11-23 DIAGNOSIS — Z79899 Other long term (current) drug therapy: Secondary | ICD-10-CM | POA: Diagnosis not present

## 2015-11-23 DIAGNOSIS — E162 Hypoglycemia, unspecified: Secondary | ICD-10-CM

## 2015-11-23 LAB — CBC WITH DIFFERENTIAL/PLATELET
BASOS ABS: 0 10*3/uL (ref 0.0–0.1)
Basophils Relative: 1 %
Eosinophils Absolute: 0.2 10*3/uL (ref 0.0–0.7)
Eosinophils Relative: 4 %
HEMATOCRIT: 34.5 % — AB (ref 39.0–52.0)
Hemoglobin: 11 g/dL — ABNORMAL LOW (ref 13.0–17.0)
LYMPHS PCT: 33 %
Lymphs Abs: 1.2 10*3/uL (ref 0.7–4.0)
MCH: 28.6 pg (ref 26.0–34.0)
MCHC: 31.9 g/dL (ref 30.0–36.0)
MCV: 89.8 fL (ref 78.0–100.0)
MONOS PCT: 14 %
Monocytes Absolute: 0.5 10*3/uL (ref 0.1–1.0)
NEUTROS ABS: 1.8 10*3/uL (ref 1.7–7.7)
Neutrophils Relative %: 48 %
Platelets: 79 10*3/uL — ABNORMAL LOW (ref 150–400)
RBC: 3.84 MIL/uL — ABNORMAL LOW (ref 4.22–5.81)
RDW: 17.1 % — ABNORMAL HIGH (ref 11.5–15.5)
WBC: 3.7 10*3/uL — ABNORMAL LOW (ref 4.0–10.5)

## 2015-11-23 LAB — CBG MONITORING, ED
GLUCOSE-CAPILLARY: 38 mg/dL — AB (ref 65–99)
GLUCOSE-CAPILLARY: 123 mg/dL — AB (ref 65–99)
Glucose-Capillary: 51 mg/dL — ABNORMAL LOW (ref 65–99)
Glucose-Capillary: 124 mg/dL — ABNORMAL HIGH (ref 65–99)
Glucose-Capillary: 150 mg/dL — ABNORMAL HIGH (ref 65–99)

## 2015-11-23 LAB — AMMONIA: Ammonia: 65 umol/L — ABNORMAL HIGH (ref 9–35)

## 2015-11-23 LAB — COMPREHENSIVE METABOLIC PANEL
ALK PHOS: 102 U/L (ref 38–126)
ALT: 44 U/L (ref 17–63)
AST: 181 U/L — ABNORMAL HIGH (ref 15–41)
Albumin: 3.4 g/dL — ABNORMAL LOW (ref 3.5–5.0)
Anion gap: 9 (ref 5–15)
BUN: 9 mg/dL (ref 6–20)
CO2: 28 mmol/L (ref 22–32)
CREATININE: 0.95 mg/dL (ref 0.61–1.24)
Calcium: 8.7 mg/dL — ABNORMAL LOW (ref 8.9–10.3)
Chloride: 105 mmol/L (ref 101–111)
GFR calc non Af Amer: >60 mL/min (ref >60)
Glucose, Bld: 53 mg/dL — ABNORMAL LOW (ref 65–99)
Potassium: 3.2 mmol/L — ABNORMAL LOW (ref 3.5–5.1)
Sodium: 142 mmol/L (ref 135–145)
Total Bilirubin: 1.3 mg/dL — ABNORMAL HIGH (ref 0.3–1.2)
Total Protein: 6.6 g/dL (ref 6.5–8.1)

## 2015-11-23 LAB — ETHANOL

## 2015-11-23 LAB — URINALYSIS, ROUTINE W REFLEX MICROSCOPIC
BILIRUBIN URINE: NEGATIVE
Glucose, UA: NEGATIVE mg/dL
HGB URINE DIPSTICK: NEGATIVE
Ketones, ur: NEGATIVE mg/dL
Leukocytes, UA: NEGATIVE
NITRITE: NEGATIVE
Protein, ur: NEGATIVE mg/dL
SPECIFIC GRAVITY, URINE: 1.01 (ref 1.005–1.030)
pH: 6 (ref 5.0–8.0)

## 2015-11-23 LAB — RAPID URINE DRUG SCREEN, HOSP PERFORMED
Amphetamines: NOT DETECTED
Barbiturates: NOT DETECTED
Benzodiazepines: POSITIVE — AB
Cocaine: NOT DETECTED
OPIATES: NOT DETECTED
Tetrahydrocannabinol: NOT DETECTED

## 2015-11-23 MED ORDER — SODIUM CHLORIDE 0.9 % IV BOLUS (SEPSIS)
500.0000 mL | Freq: Once | INTRAVENOUS | Status: AC
  Administered 2015-11-23: 500 mL via INTRAVENOUS

## 2015-11-23 MED ORDER — DEXTROSE 50 % IV SOLN
1.0000 | Freq: Once | INTRAVENOUS | Status: AC
  Administered 2015-11-23: 50 mL via INTRAVENOUS

## 2015-11-23 MED ORDER — DEXTROSE 50 % IV SOLN
INTRAVENOUS | Status: DC
Start: 2015-11-23 — End: 2015-11-23
  Filled 2015-11-23: qty 50

## 2015-11-23 NOTE — ED Notes (Signed)
Given 2 orange juices and peanut butter crackers.  Pt talking and answering questions.

## 2015-11-23 NOTE — ED Provider Notes (Signed)
CSN: [id number]     Arrival date & time 11/23/15  1217 History  By signing my name below, I, Stephania Fragmin, attest that this documentation has been prepared under the direction and in the presence of Davonna Belling, MD. Electronically Signed: Stephania Fragmin, ED Scribe. 11/23/2015. 1:02 PM.   Chief Complaint  Patient presents with  . Altered Mental Status  . Hypoglycemia   The history is provided by the patient and medical records. The history is limited by the condition of the patient (altered mental status). No language interpreter was used.   LEVEL 5 CAVEAT DUE TO MR/ALTERED MENTAL STATUS  HPI Comments:  [name] [name] is a 55 y.o. male with a history of depression, anxiety, MR, DM, cirrhosis, hypertension, COPD, seizures, GERD, anemia, and seizures, to the Emergency Department brought in by EMS for confusion. Per EMS, [facility] PD had seen patient on the road "walking funny." They had brought him home and patient was oriented to self but not day. He was found to have a CBG of 68. Patient states he was walking to church because his wife had left him for church. Patient states he has been in his normal state of health over the past several days. He states he did not eat breakfast today. He notes an associated headache and chronic leg pain. He denies fever or chills.   PCP: Carolee Rota, NP   Past Medical History  Diagnosis Date  . Depression   . Cirrhosis ([place])     suspected NASH  . Bipolar 1 disorder ([place])   . Hypertension   . Diabetes mellitus   . Seizures ([place])   . COPD (chronic obstructive pulmonary disease) ([place])   . Mental retardation   . Blind right eye   . GERD (gastroesophageal reflux disease)   . Chronic back pain   . Hepatic encephalopathy ([place])   . Anemia   . Thrombocytopenia ([place])   . Cardiomegaly   . Anxiety   . Arthritis   . Cholelithiasis     Asymptomatic  . Cerebrovascular disease     Right vertebral artery  . Hard of hearing   . Hyperlipidemia   . Cataract      Right eye  . Blood clots in brain     per patient  . Cirrhosis ([place])     diagnosed 07/2010 when presented with first episode of hepatic encephalopathy, afp on 416/13=4.8,  U/S on 12/14/11  liver stable, pt has received 2 hep A/B vaccines  . Diverticulosis    Past Surgical History  Procedure Laterality Date  . Tonsillectomy    . Ear operations    . Cast application  0/01/3015    Procedure: MINOR CAST APPLICATION;  Surgeon: [name], MD;  Location: [facility];  Service: Orthopedics;  Laterality: Right;  no anesthesia , procedure room do not need or room !  . Orif ankle fracture  08/27/2011    Procedure: OPEN REDUCTION INTERNAL FIXATION (ORIF) ANKLE FRACTURE;  Surgeon: [name], MD;  Location: [facility];  Service: Orthopedics;  Laterality: Right;  . Esophagogastroduodenoscopy  02/02/2011    Rourk-Normal esophagus.  No varices/Question mild portal gastropathy, extrinsic compression on the antrum, lesser curvature of uncertain significance, some minimally  nodular mucosa status post biopsy, patent pylorus, normal duodenum 1 and duodenum 2.  . Colonoscopy  12/15/11    colonic divericulosis;poor prep, ACBE  recommended but has not been done  . Esophagogastroduodenoscopy  12/15/11    Rourk-->mild changes of portal gastropathy, gastric/duodenal erosions s/p  bx  (reactive changes with mild chronic inflammation. No H.pylori  . Foot surgery     Family History  Problem Relation Age of Onset  . Heart failure Mother   . Thyroid disease Mother   . Cancer Father   . Asthma Brother   . Heart attack Brother   . Cirrhosis Brother     EtOH  . Colon cancer Maternal Aunt    Social History  Substance Use Topics  . Smoking status: Current Every Day Smoker -- 1.00 packs/day for 30 years    Types: Cigarettes  . Smokeless tobacco: None     Comment: only smokes about 1-2 a day  . Alcohol Use: No    Review of Systems  Unable to perform ROS: Mental status change   Allergies  Penicillins  Home  Medications   Prior to Admission medications   Medication Sig Start Date End Date Taking? Authorizing Provider  ALPRAZolam Duanne Moron) 0.5 MG tablet Take 0.5 mg by mouth 3 (three) times daily as needed for anxiety.    Historical Provider, MD  aspirin EC 81 MG tablet Take 81 mg by mouth daily.    Historical Provider, MD  Cholecalciferol (VITAMIN D) 2000 UNITS tablet Take 1 tablet (2,000 Units total) by mouth daily. 09/20/12   Nimish Luther Parody, MD  fish oil-omega-3 fatty acids 1000 MG capsule Take 1 g by mouth 3 (three) times daily.    Historical Provider, MD  FLUoxetine (PROZAC) 40 MG capsule Take 40 mg by mouth daily. 10/26/13   Historical Provider, MD  furosemide (LASIX) 20 MG tablet Take 20 mg by mouth daily.    Historical Provider, MD  glimepiride (AMARYL) 2 MG tablet Take 2 mg by mouth daily before breakfast.    Historical Provider, MD  lactulose (CHRONULAC) 10 GM/15ML solution Take 60 mLs (40 g total) by mouth 3 (three) times daily. Titrate for 3-4 bowel motions daily. 01/03/14   Carmin Muskrat, MD  levETIRAcetam (KEPPRA) 500 MG tablet Take 500 mg by mouth 2 (two) times daily.    Historical Provider, MD  lidocaine (XYLOCAINE) 2 % jelly Apply 1 application topically as needed. Irritation/pain    Historical Provider, MD  metoprolol tartrate (LOPRESSOR) 25 MG tablet Take 25-50 mg by mouth 2 (two) times daily. Take 25 mg in the morning and 50 mg at night    Historical Provider, MD  Multiple Vitamin (MULTIVITAMIN WITH MINERALS) TABS Take 1 tablet by mouth daily.    Historical Provider, MD  omeprazole (PRILOSEC) 20 MG capsule Take 1 capsule (20 mg total) by mouth daily. 09/20/12   Nimish Luther Parody, MD  potassium chloride SA (K-DUR,KLOR-CON) 20 MEQ tablet Take 1 tablet (20 mEq total) by mouth daily. 09/20/12   Nimish C Anastasio Champion, MD   BP 135/61 mmHg  Pulse 90  Resp 18  SpO2 98% Physical Exam  Constitutional: He is oriented to person, place, and time. He appears well-developed and well-nourished. No  distress.  HENT:  Head: Normocephalic and atraumatic.  Rash on face. Chronically blind right eye. Mucous membranes are dry.   Eyes: Conjunctivae and EOM are normal.  Neck: Neck supple. No tracheal deviation present.  Cardiovascular: Normal rate and regular rhythm.   Pulmonary/Chest: Effort normal and breath sounds normal. No respiratory distress. He has no wheezes. He has no rales. He exhibits no tenderness.  Lungs are clear to auscultation.  Abdominal: Soft. He exhibits distension.  Abdomen soft, but mildly distended.  Musculoskeletal: Normal range of motion. He exhibits edema.  Edema  to BLE. Post-op shoes on both feet.   Neurological: He is alert and oriented to person, place, and time.  Skin: Skin is warm and dry.  Psychiatric: He has a normal mood and affect. His behavior is normal.  Nursing note and vitals reviewed.   ED Course  Procedures (including critical care time)  DIAGNOSTIC STUDIES: Oxygen Saturation is 99% on RA, normal by my interpretation.    COORDINATION OF CARE: 12:44 PM - Discussed treatment plan with pt at bedside. Pt verbalized understanding and agreed to plan.   Labs Review Labs Reviewed  COMPREHENSIVE METABOLIC PANEL - Abnormal; Notable for the following:    Potassium 3.2 (*)    Glucose, Bld 53 (*)    Calcium 8.7 (*)    Albumin 3.4 (*)    AST 181 (*)    Total Bilirubin 1.3 (*)    All other components within normal limits  URINE RAPID DRUG SCREEN, HOSP PERFORMED - Abnormal; Notable for the following:    Benzodiazepines POSITIVE (*)    All other components within normal limits  AMMONIA - Abnormal; Notable for the following:    Ammonia 65 (*)    All other components within normal limits  CBC WITH DIFFERENTIAL/PLATELET - Abnormal; Notable for the following:    WBC 3.7 (*)    RBC 3.84 (*)    Hemoglobin 11.0 (*)    HCT 34.5 (*)    RDW 17.1 (*)    Platelets 79 (*)    All other components within normal limits  CBG MONITORING, ED - Abnormal; Notable  for the following:    Glucose-Capillary 38 (*)    All other components within normal limits  CBG MONITORING, ED - Abnormal; Notable for the following:    Glucose-Capillary 51 (*)    All other components within normal limits  CBG MONITORING, ED - Abnormal; Notable for the following:    Glucose-Capillary 124 (*)    All other components within normal limits  ETHANOL  URINALYSIS, ROUTINE W REFLEX MICROSCOPIC (NOT AT [facility])  CBG MONITORING, ED    Imaging Review Dg Chest 2 View  11/23/2015  CLINICAL DATA:  Altered mental status, confusion EXAM: CHEST  2 VIEW COMPARISON:  01/03/2014 FINDINGS: Rotated exam to the right. Cardiomegaly evident without CHF or definite pneumonia. No collapse or consolidation. Negative for edema or pneumothorax. Monitor leads overlie the chest. No acute osseous finding. IMPRESSION: Rotated exam. Mild cardiomegaly without acute process. No significant interval change. Electronically Signed   By: Jerilynn Mages.  Shick M.D.   On: 11/23/2015 14:04   I have personally reviewed and evaluated these images and lab results as part of my medical decision-making.   EKG Interpretation None      MDM   Final diagnoses:  Hypoglycemia  Hyperammonemia ([place])   Patient presents with reported altered mental status. Found to be hypoglycemic here with a sugar of 38. Initial D50 had not been given. Patient had eaten something and it sugar up to 58. D50 given. Recheck after that was 124. We'll continue to monitor. Patient's ammonia is elevated, but it appears lower than he has been in the past. Patient states he feels better after the treatment. He is tolerated orals here. Appears to be close to his baseline. Will likely be able to discharge her sugar remained stable. Pancytopenia appears stable. I personally performed the services described in this documentation, which was scribed in my presence. The recorded information has been reviewed and is accurate.      Davonna Belling,  MD 11/23/15  1512

## 2015-11-23 NOTE — ED Notes (Signed)
Pt alert and controlling saliva. Pt given orange juice.

## 2015-11-23 NOTE — ED Provider Notes (Signed)
Multiple CBG have been normal - eating normal - feeling well - ambulating - stable for d/c.  Noemi Chapel, MD 11/23/15 [phone]

## 2015-11-23 NOTE — Discharge Instructions (Signed)

## 2015-11-23 NOTE — ED Notes (Signed)
CBG 51.  Pt given food tray.

## 2015-11-23 NOTE — ED Notes (Signed)
Pt reports he was not confused, he said his wife left him for church and he was going to walk to church.

## 2015-11-23 NOTE — ED Notes (Signed)
Pt alert & oriented x4, stable gait. Patient given discharge instructions, paperwork & prescription(s). Patient  instructed to stop at the registration desk to finish any additional paperwork. Patient  verbalized understanding. Pt left department in wheelchair w/ no further questions. 

## 2015-11-23 NOTE — ED Notes (Signed)
MD at bedside. 

## 2015-11-23 NOTE — ED Notes (Signed)
Per ems, [place] PD saw pt walking down the road "walking funny" and when they stopped him he appeared confused.  EMS says they figured out his address and took him home.  When pt got home ems was called and they report pt was oriented to self but not the day.   Reports cbg 68.  Pt diabetic but not on insulin.  EMS reports vss.  Pt has history of cirrhosis and has history of increased ammonia levels that caused confusion.  EMS reports pt's skin and mucus membranes are dry.  Pt alert, c/o chronic r leg pain.  Pt blind in r eye.

## 2015-12-07 ENCOUNTER — Emergency Department (HOSPITAL_COMMUNITY): Payer: Medicare HMO

## 2015-12-07 ENCOUNTER — Encounter (HOSPITAL_COMMUNITY): Payer: Self-pay

## 2015-12-07 ENCOUNTER — Inpatient Hospital Stay (HOSPITAL_COMMUNITY)
Admission: EM | Admit: 2015-12-07 | Discharge: 2015-12-10 | DRG: 442 | Disposition: A | Discharge status: Home / Self Care | Payer: Medicare HMO | Attending: Internal Medicine | Admitting: Internal Medicine

## 2015-12-07 DIAGNOSIS — F79 Unspecified intellectual disabilities: Secondary | ICD-10-CM | POA: Diagnosis present

## 2015-12-07 DIAGNOSIS — Z720 Tobacco use: Secondary | ICD-10-CM | POA: Diagnosis present

## 2015-12-07 DIAGNOSIS — Z7984 Long term (current) use of oral hypoglycemic drugs: Secondary | ICD-10-CM | POA: Diagnosis not present

## 2015-12-07 DIAGNOSIS — Z7982 Long term (current) use of aspirin: Secondary | ICD-10-CM

## 2015-12-07 DIAGNOSIS — R4182 Altered mental status, unspecified: Secondary | ICD-10-CM | POA: Diagnosis not present

## 2015-12-07 DIAGNOSIS — I5032 Chronic diastolic (congestive) heart failure: Secondary | ICD-10-CM | POA: Diagnosis present

## 2015-12-07 DIAGNOSIS — Z79899 Other long term (current) drug therapy: Secondary | ICD-10-CM | POA: Diagnosis not present

## 2015-12-07 DIAGNOSIS — H5441 Blindness, right eye, normal vision left eye: Secondary | ICD-10-CM | POA: Diagnosis present

## 2015-12-07 DIAGNOSIS — E11621 Type 2 diabetes mellitus with foot ulcer: Secondary | ICD-10-CM | POA: Diagnosis present

## 2015-12-07 DIAGNOSIS — D61818 Other pancytopenia: Secondary | ICD-10-CM | POA: Diagnosis present

## 2015-12-07 DIAGNOSIS — L97519 Non-pressure chronic ulcer of other part of right foot with unspecified severity: Secondary | ICD-10-CM | POA: Diagnosis present

## 2015-12-07 DIAGNOSIS — G8929 Other chronic pain: Secondary | ICD-10-CM | POA: Diagnosis present

## 2015-12-07 DIAGNOSIS — K7581 Nonalcoholic steatohepatitis (NASH): Secondary | ICD-10-CM | POA: Diagnosis present

## 2015-12-07 DIAGNOSIS — F1290 Cannabis use, unspecified, uncomplicated: Secondary | ICD-10-CM | POA: Diagnosis present

## 2015-12-07 DIAGNOSIS — Z9114 Patient's other noncompliance with medication regimen: Secondary | ICD-10-CM | POA: Diagnosis not present

## 2015-12-07 DIAGNOSIS — K219 Gastro-esophageal reflux disease without esophagitis: Secondary | ICD-10-CM | POA: Diagnosis present

## 2015-12-07 DIAGNOSIS — F17210 Nicotine dependence, cigarettes, uncomplicated: Secondary | ICD-10-CM | POA: Diagnosis present

## 2015-12-07 DIAGNOSIS — K7460 Unspecified cirrhosis of liver: Secondary | ICD-10-CM | POA: Diagnosis present

## 2015-12-07 DIAGNOSIS — F419 Anxiety disorder, unspecified: Secondary | ICD-10-CM | POA: Diagnosis present

## 2015-12-07 DIAGNOSIS — E785 Hyperlipidemia, unspecified: Secondary | ICD-10-CM | POA: Diagnosis present

## 2015-12-07 DIAGNOSIS — K7200 Acute and subacute hepatic failure without coma: Secondary | ICD-10-CM | POA: Diagnosis not present

## 2015-12-07 DIAGNOSIS — J449 Chronic obstructive pulmonary disease, unspecified: Secondary | ICD-10-CM | POA: Diagnosis present

## 2015-12-07 DIAGNOSIS — G40909 Epilepsy, unspecified, not intractable, without status epilepticus: Secondary | ICD-10-CM

## 2015-12-07 DIAGNOSIS — F319 Bipolar disorder, unspecified: Secondary | ICD-10-CM | POA: Diagnosis present

## 2015-12-07 DIAGNOSIS — K7682 Hepatic encephalopathy: Secondary | ICD-10-CM | POA: Diagnosis present

## 2015-12-07 DIAGNOSIS — Z66 Do not resuscitate: Secondary | ICD-10-CM | POA: Diagnosis present

## 2015-12-07 DIAGNOSIS — I110 Hypertensive heart disease with heart failure: Secondary | ICD-10-CM | POA: Diagnosis present

## 2015-12-07 DIAGNOSIS — K7290 Hepatic failure, unspecified without coma: Secondary | ICD-10-CM | POA: Diagnosis not present

## 2015-12-07 DIAGNOSIS — F329 Major depressive disorder, single episode, unspecified: Secondary | ICD-10-CM | POA: Diagnosis present

## 2015-12-07 LAB — URINE MICROSCOPIC-ADD ON: BACTERIA UA: NONE SEEN

## 2015-12-07 LAB — RAPID URINE DRUG SCREEN, HOSP PERFORMED
AMPHETAMINES: NOT DETECTED
BENZODIAZEPINES: POSITIVE — AB
Barbiturates: NOT DETECTED
COCAINE: NOT DETECTED
OPIATES: NOT DETECTED
Tetrahydrocannabinol: POSITIVE — AB

## 2015-12-07 LAB — URINALYSIS, ROUTINE W REFLEX MICROSCOPIC
BILIRUBIN URINE: NEGATIVE
Glucose, UA: NEGATIVE mg/dL
Ketones, ur: NEGATIVE mg/dL
Leukocytes, UA: NEGATIVE
Nitrite: NEGATIVE
PH: 6 (ref 5.0–8.0)
Protein, ur: NEGATIVE mg/dL
SPECIFIC GRAVITY, URINE: 1.011 (ref 1.005–1.030)

## 2015-12-07 LAB — CBC
HCT: 30.9 % — ABNORMAL LOW (ref 39.0–52.0)
HEMOGLOBIN: 9.7 g/dL — AB (ref 13.0–17.0)
MCH: 28 pg (ref 26.0–34.0)
MCHC: 31.4 g/dL (ref 30.0–36.0)
MCV: 89 fL (ref 78.0–100.0)
Platelets: 51 10*3/uL — ABNORMAL LOW (ref 150–400)
RBC: 3.47 MIL/uL — AB (ref 4.22–5.81)
RDW: 17.7 % — ABNORMAL HIGH (ref 11.5–15.5)
WBC: 2.8 10*3/uL — ABNORMAL LOW (ref 4.0–10.5)

## 2015-12-07 LAB — AMMONIA: AMMONIA: 81 umol/L — AB (ref 9–35)

## 2015-12-07 LAB — I-STAT ARTERIAL BLOOD GAS, ED
Bicarbonate: 24.1 mEq/L — ABNORMAL HIGH (ref 20.0–24.0)
O2 SAT: 96 %
PCO2 ART: 37.5 mmHg (ref 35.0–45.0)
PO2 ART: 78 mmHg — AB (ref 80.0–100.0)
TCO2: 25 mmol/L (ref 0–100)
pH, Arterial: 7.414 (ref 7.350–7.450)

## 2015-12-07 LAB — I-STAT CG4 LACTIC ACID, ED: Lactic Acid, Venous: 2.24 mmol/L (ref 0.5–2.0)

## 2015-12-07 LAB — COMPREHENSIVE METABOLIC PANEL
ALT: 20 U/L (ref 17–63)
AST: 62 U/L — ABNORMAL HIGH (ref 15–41)
Albumin: 2.8 g/dL — ABNORMAL LOW (ref 3.5–5.0)
Alkaline Phosphatase: 103 U/L (ref 38–126)
Anion gap: 10 (ref 5–15)
BILIRUBIN TOTAL: 1.8 mg/dL — AB (ref 0.3–1.2)
BUN: 5 mg/dL — AB (ref 6–20)
CHLORIDE: 109 mmol/L (ref 101–111)
CO2: 23 mmol/L (ref 22–32)
Calcium: 8.9 mg/dL (ref 8.9–10.3)
Creatinine, Ser: 0.7 mg/dL (ref 0.61–1.24)
Glucose, Bld: 112 mg/dL — ABNORMAL HIGH (ref 65–99)
Potassium: 4.2 mmol/L (ref 3.5–5.1)
Sodium: 142 mmol/L (ref 135–145)
TOTAL PROTEIN: 5.9 g/dL — AB (ref 6.5–8.1)

## 2015-12-07 LAB — CBG MONITORING, ED
GLUCOSE-CAPILLARY: 84 mg/dL (ref 65–99)
Glucose-Capillary: 88 mg/dL (ref 65–99)

## 2015-12-07 LAB — CK: Total CK: 1027 U/L — ABNORMAL HIGH (ref 49–397)

## 2015-12-07 MED ORDER — VANCOMYCIN HCL IN DEXTROSE 1-5 GM/200ML-% IV SOLN
1000.0000 mg | Freq: Once | INTRAVENOUS | Status: AC
  Administered 2015-12-07: 1000 mg via INTRAVENOUS
  Filled 2015-12-07: qty 200

## 2015-12-07 MED ORDER — LEVETIRACETAM 500 MG PO TABS: 500.0000 mg | ORAL_TABLET | Freq: Two times a day (BID) | ORAL | Status: DC

## 2015-12-07 MED ORDER — LACTULOSE 10 GM/15ML PO SOLN
30.0000 g | Freq: Once | ORAL | Status: DC
  Filled 2015-12-07: qty 45

## 2015-12-07 MED ORDER — SODIUM CHLORIDE 0.9% FLUSH
3.0000 mL | Freq: Two times a day (BID) | INTRAVENOUS | Status: DC
  Administered 2015-12-07 – 2015-12-09 (×5): 3 mL via INTRAVENOUS

## 2015-12-07 MED ORDER — SODIUM POLYSTYRENE SULFONATE 15 GM/60ML PO SUSP: 60.0000 g | Freq: Once | ORAL | Status: DC

## 2015-12-07 MED ORDER — INSULIN ASPART 100 UNIT/ML ~~LOC~~ SOLN
0.0000 [IU] | SUBCUTANEOUS | Status: DC
  Filled 2015-12-07: qty 1

## 2015-12-07 MED ORDER — HEPARIN SODIUM (PORCINE) 5000 UNIT/ML IJ SOLN
5000.0000 [IU] | Freq: Three times a day (TID) | INTRAMUSCULAR | Status: DC
  Administered 2015-12-07 – 2015-12-10 (×8): 5000 [IU] via SUBCUTANEOUS
  Filled 2015-12-07 (×8): qty 1

## 2015-12-07 MED ORDER — PANTOPRAZOLE SODIUM 40 MG PO TBEC
40.0000 mg | DELAYED_RELEASE_TABLET | Freq: Every day | ORAL | Status: DC
  Administered 2015-12-07 – 2015-12-10 (×4): 40 mg via ORAL
  Filled 2015-12-07 (×4): qty 1

## 2015-12-07 MED ORDER — RIFAXIMIN 550 MG PO TABS
550.0000 mg | ORAL_TABLET | Freq: Three times a day (TID) | ORAL | Status: DC
  Administered 2015-12-08 – 2015-12-09 (×4): 550 mg via ORAL
  Filled 2015-12-07 (×6): qty 1

## 2015-12-07 MED ORDER — SODIUM CHLORIDE 0.9 % IV SOLN
500.0000 mg | Freq: Two times a day (BID) | INTRAVENOUS | Status: DC
  Administered 2015-12-07 – 2015-12-08 (×2): 500 mg via INTRAVENOUS
  Filled 2015-12-07 (×4): qty 5

## 2015-12-07 MED ORDER — ASPIRIN 300 MG RE SUPP
150.0000 mg | Freq: Every day | RECTAL | Status: DC
  Administered 2015-12-07 – 2015-12-08 (×2): 150 mg via RECTAL
  Filled 2015-12-07 (×2): qty 1

## 2015-12-07 MED ORDER — DEXTROSE 5 % IV SOLN
1.0000 g | INTRAVENOUS | Status: DC
  Administered 2015-12-07 – 2015-12-08 (×2): 1 g via INTRAVENOUS
  Filled 2015-12-07 (×3): qty 10

## 2015-12-07 MED ORDER — SODIUM CHLORIDE 0.9 % IV BOLUS (SEPSIS)
500.0000 mL | Freq: Once | INTRAVENOUS | Status: AC
  Administered 2015-12-07: 500 mL via INTRAVENOUS

## 2015-12-07 MED ORDER — ALPRAZOLAM 0.5 MG PO TABS
0.5000 mg | ORAL_TABLET | Freq: Every day | ORAL | Status: DC
  Administered 2015-12-08 – 2015-12-09 (×2): 0.5 mg via ORAL
  Filled 2015-12-07 (×2): qty 1

## 2015-12-07 MED ORDER — ONDANSETRON HCL 4 MG PO TABS: 4.0000 mg | ORAL_TABLET | Freq: Four times a day (QID) | ORAL | Status: DC | PRN

## 2015-12-07 MED ORDER — VANCOMYCIN HCL 10 G IV SOLR
1250.0000 mg | Freq: Two times a day (BID) | INTRAVENOUS | Status: DC
  Administered 2015-12-08 – 2015-12-09 (×3): 1250 mg via INTRAVENOUS
  Filled 2015-12-07 (×4): qty 1250

## 2015-12-07 MED ORDER — ONDANSETRON HCL 4 MG/2ML IJ SOLN: 4.0000 mg | Freq: Four times a day (QID) | INTRAMUSCULAR | Status: DC | PRN

## 2015-12-07 MED ORDER — LACTULOSE 10 GM/15ML PO SOLN
30.0000 g | Freq: Four times a day (QID) | ORAL | Status: DC
Start: 2015-12-07 — End: 2015-12-10
  Administered 2015-12-08 – 2015-12-10 (×11): 30 g via ORAL
  Filled 2015-12-07 (×13): qty 45

## 2015-12-07 MED ORDER — PIPERACILLIN-TAZOBACTAM 3.375 G IVPB
3.3750 g | Freq: Once | INTRAVENOUS | Status: AC
  Administered 2015-12-07: 3.375 g via INTRAVENOUS
  Filled 2015-12-07: qty 50

## 2015-12-07 NOTE — ED Notes (Signed)
Family at bedside. 

## 2015-12-07 NOTE — ED Notes (Signed)
Patient transported to CT 

## 2015-12-07 NOTE — Progress Notes (Addendum)
Pharmacy Antibiotic Note  [name] [name] is a 55 y.o. male admitted on 12/07/2015 with cellulitis.  Pharmacy has been consulted for vanc dosing.  55 yo who came in for AMS and fall. Pt has a hx of cirrhosis and DM. Vanc has been ordered to r/o cellulitis. One dose of vanc has been given in the ED.   Plan:  Vanc 1.25g IV q12 Trough as needed  Weight: 241 lb (109.317 kg)  Temp (24hrs), Avg:98.9 F (37.2 C), Min:98.9 F (37.2 C), Max:98.9 F (37.2 C)   Recent Labs Lab 12/07/15 1404 12/07/15 1516  WBC 2.8*  --   CREATININE 0.70  --   LATICACIDVEN  --  2.24*    CrCl cannot be calculated (Unknown ideal weight.).    Allergies  Allergen Reactions  . Penicillins Rash    Antimicrobials this admission: 4/16 Vanc >>  4/16 ceftriaxone >>  4/16 zosyn x1  Dose adjustments this admission:   Microbiology results: 4/16 BCx: pending   Onnie Boer, PharmD Pager: [phone] 12/07/2015 6:03 PM

## 2015-12-07 NOTE — H&P (Signed)
TRH H&P   Patient Demographics:    [name] [name], is a 55 y.o. male  MRN: [id number]   DOB - 10/29/1960  Admit Date - 12/07/2015  Outpatient Primary MD for the patient is Carolee Rota, NP  Referring MD/NP/PA: Dr. Vanita Panda, Ray  Outpatient Specialists: [facility] at [facility]   Patient coming from: [facility] ER  Chief Complaint  Patient presents with  . Altered Mental Status  . Fall      HPI:    [name] [name]  is a 55 y.o. male,  History of [name] [name], cirrhosis with pancytopenia, chronic diastolic heart failure last EF 50%, seizure is not compliant with his Keppra, hepatic encephalopathy on lactulose, diabetes mellitus type 2 not on insulin, diverticulosis, cataract in the right eye, chronic back pain and anxiety on a cortex and benzodiazepines, decreased drug use, COPD, mental retarded addition, hypertension, depression who has had several episodes of hepatic encephalopathy in the past brought in by his wife for gradually progressive confusion and somnolence or the last week or so, he had a mechanical fall in the bathroom this morning and was able to get up and was on the floor for several hours, thereafter called 911 and brought him to the ER.   In the ER was found to be encephalopathic and somnolent, initial blood work showed elevated ammonia, pancytopenia, head CT was unremarkable, new to was consulted and I was called to admit the patient. Patient except for sleepiness and generalized weakness has no subjective complaints.    Review of systems:    In addition to the HPI above, Denies any subjective complaints but is somnolent No Fever-chills, No Headache, No changes with Vision or hearing, No problems swallowing food or  Liquids, No Chest pain, Cough or Shortness of Breath, No Abdominal pain, No Nausea or Vommitting, Bowel movements are regular, No Blood in stool or Urine, No dysuria, No new skin rashes or bruises, No new joints pains-aches,  No new weakness, tingling, numbness in any extremity, No recent weight gain or loss, No polyuria, polydypsia or polyphagia, No significant Mental Stressors.  A full 10 point Review of Systems was done, except as stated above, all other Review of Systems were negative.   With Past History of the following :    Past Medical History  Diagnosis Date  . Depression   . 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 Bipolar 1 disorder ([place])   . Hypertension   . Diabetes mellitus   . Seizures ([place])   . COPD (chronic obstructive pulmonary disease) ([place])   . Mental retardation   . Blind right eye   . GERD (gastroesophageal reflux disease)   . Chronic back pain   . Hepatic encephalopathy ([place])   . Anemia   . Thrombocytopenia ([place])   . Cardiomegaly   . Anxiety   . Arthritis   . Cholelithiasis     Asymptomatic  . Cerebrovascular disease     Right vertebral artery  . Hard of hearing   . Hyperlipidemia   . Cataract     Right eye  . Blood clots in brain     per patient  . Cirrhosis ([place])     diagnosed 07/2010 when presented with first episode of hepatic encephalopathy, afp on 416/13=4.8,  U/S on 12/14/11  liver stable, pt has received 2 hep A/B vaccines  . Diverticulosis       Past Surgical History  Procedure Laterality Date  . Tonsillectomy    . Ear operations    . Cast application  02/28/9380    Procedure: MINOR CAST APPLICATION;  Surgeon: [name], MD;  Location: [facility];  Service: Orthopedics;  Laterality: Right;  no anesthesia , procedure room do not need or room !  . Orif ankle fracture  08/27/2011    Procedure: OPEN REDUCTION INTERNAL FIXATION (ORIF) ANKLE FRACTURE;  Surgeon: [name], MD;  Location: [facility];  Service: Orthopedics;  Laterality:  Right;  . Esophagogastroduodenoscopy  02/02/2011    Rourk-Normal esophagus.  No varices/Question mild portal gastropathy, extrinsic compression on the antrum, lesser curvature of uncertain significance, some minimally  nodular mucosa status post biopsy, patent pylorus, normal duodenum 1 and duodenum 2.  . Colonoscopy  12/15/11    colonic divericulosis;poor prep, ACBE  recommended but has not been done  . Esophagogastroduodenoscopy  12/15/11    Rourk-->mild changes of portal gastropathy, gastric/duodenal erosions s/p bx  (reactive changes with mild chronic inflammation. No H.pylori  . Foot surgery        Social History:     Social History  Substance Use Topics  . Smoking status: Current Every Day Smoker -- 1.00 packs/day for 30 years    Types: Cigarettes  . Smokeless tobacco: Not on file     Comment: only smokes about 1-2 a day  . Alcohol Use: No     Lives - At home with wife, still ambulates by himself      Family History :     Family History  Problem Relation Age of Onset  . Heart failure Mother   . Thyroid disease Mother   . Cancer Father   . Asthma Brother   . Heart attack Brother   . Cirrhosis Brother     EtOH  . Colon cancer Maternal Aunt        Home Medications:   Prior to Admission medications   Medication Sig Start Date End Date Taking? Authorizing Provider  ALPRAZolam Duanne Moron) 0.5 MG tablet Take 0.5 mg by mouth 3 (three) times daily as needed for anxiety.    Historical Provider, MD  aspirin EC 81 MG tablet Take 81 mg by mouth daily.    Historical Provider, MD  Cholecalciferol (VITAMIN D) 2000 UNITS tablet Take 1 tablet (2,000 Units total) by mouth daily. 09/20/12   Nimish Luther Parody, MD  fish oil-omega-3 fatty acids 1000 MG capsule Take 1  g by mouth 3 (three) times daily.    Historical Provider, MD  FLUoxetine (PROZAC) 40 MG capsule Take 40 mg by mouth daily. 10/26/13   Historical Provider, MD  furosemide (LASIX) 20 MG tablet Take 20 mg by mouth daily.     Historical Provider, MD  glimepiride (AMARYL) 2 MG tablet Take 2 mg by mouth daily before breakfast.    Historical Provider, MD  HYDROcodone-acetaminophen (NORCO/VICODIN) 5-325 MG tablet Take 1 tablet by mouth 3 (three) times daily as needed. For pain 11/10/15   Historical Provider, MD  lactulose (CHRONULAC) 10 GM/15ML solution Take 60 mLs (40 g total) by mouth 3 (three) times daily. Titrate for 3-4 bowel motions daily. 01/03/14   Carmin Muskrat, MD  levETIRAcetam (KEPPRA) 500 MG tablet Take 500 mg by mouth 2 (two) times daily.    Historical Provider, MD  lidocaine (XYLOCAINE) 2 % jelly Apply 1 application topically as needed. Irritation/pain    Historical Provider, MD  metoprolol tartrate (LOPRESSOR) 25 MG tablet Take 25-50 mg by mouth 2 (two) times daily. Take 25 mg in the morning and 50 mg at night    Historical Provider, MD  Multiple Vitamin (MULTIVITAMIN WITH MINERALS) TABS Take 1 tablet by mouth daily.    Historical Provider, MD  omeprazole (PRILOSEC) 20 MG capsule Take 1 capsule (20 mg total) by mouth daily. 09/20/12   Nimish Luther Parody, MD  potassium chloride SA (K-DUR,KLOR-CON) 20 MEQ tablet Take 1 tablet (20 mEq total) by mouth daily. 09/20/12   Doree Albee, MD     Allergies:     Allergies  Allergen Reactions  . Penicillins Rash     Physical Exam:   Vitals  Blood pressure 159/82, pulse 86, temperature 98.9 F (37.2 C), temperature source Rectal, resp. rate 12, weight 109.317 kg (241 lb), SpO2 100 %.   1. General elderly white male lying in bed in somnolent but follows basic commands and answer a few questions,  2. No focal deficits able to move all 4 extremities all dose slowly.  3. No F.N deficits, ALL C.Nerves Intact, Strength 5/5 all 4 extremities, Sensation intact all 4 extremities, Plantars down going.  4. Ears and Eyes appear Normal, Conjunctivae clear, PERRLA. Moist Oral Mucosa.  5. Supple Neck, No JVD, No cervical lymphadenopathy appriciated, No Carotid  Bruits.  6. Symmetrical Chest wall movement, Good air movement bilaterally, CTAB.  7. RRR, No Gallops, Rubs or Murmurs, No Parasternal Heave.  8. Positive Bowel Sounds, Abdomen Soft, No tenderness, No organomegaly appriciated,No rebound -guarding or rigidity.  9.  No Cyanosis, Normal Skin Turgor, No Skin Rash or Bruise.  10. Good muscle tone,  joints appear normal , no effusions, Normal ROM.  11. No Palpable Lymph Nodes in Neck or Axillae  R foot ulcer has an medial plantar ulcer, which is dry, no surrounding cellulitis, 1 cm in diameter, which heaped and plaque like surface     Data Review:    CBC  Recent Labs Lab 12/07/15 1404  WBC 2.8*  HGB 9.7*  HCT 30.9*  PLT 51*  MCV 89.0  MCH 28.0  MCHC 31.4  RDW 17.7*   ------------------------------------------------------------------------------------------------------------------  Chemistries   Recent Labs Lab 12/07/15 1404  NA 142  K 4.2  CL 109  CO2 23  GLUCOSE 112*  BUN 5*  CREATININE 0.70  CALCIUM 8.9  AST 62*  ALT 20  ALKPHOS 103  BILITOT 1.8*   ------------------------------------------------------------------------------------------------------------------ CrCl cannot be calculated (Unknown ideal weight.). ------------------------------------------------------------------------------------------------------------------ No results for input(s):  TSH, T4TOTAL, T3FREE, THYROIDAB in the last 72 hours.  Invalid input(s): FREET3  Coagulation profile No results for input(s): INR, PROTIME in the last 168 hours. ------------------------------------------------------------------------------------------------------------------- No results for input(s): DDIMER in the last 72 hours. -------------------------------------------------------------------------------------------------------------------  Cardiac Enzymes No results for input(s): CKMB, TROPONINI, MYOGLOBIN in the last 168 hours.  Invalid input(s):  CK ------------------------------------------------------------------------------------------------------------------ No results found for: BNP   ---------------------------------------------------------------------------------------------------------------  Urinalysis    Component Value Date/Time   COLORURINE YELLOW 12/07/2015 1430   APPEARANCEUR CLEAR 12/07/2015 1430   LABSPEC 1.011 12/07/2015 1430   PHURINE 6.0 12/07/2015 1430   GLUCOSEU NEGATIVE 12/07/2015 1430   HGBUR SMALL* 12/07/2015 1430   BILIRUBINUR NEGATIVE 12/07/2015 1430   KETONESUR NEGATIVE 12/07/2015 1430   PROTEINUR NEGATIVE 12/07/2015 1430   UROBILINOGEN 1.0 01/03/2014 1748   NITRITE NEGATIVE 12/07/2015 1430   LEUKOCYTESUR NEGATIVE 12/07/2015 1430    ----------------------------------------------------------------------------------------------------------------   Imaging Results:    Ct Head Wo Contrast  12/07/2015  CLINICAL DATA:  Altered mental status, found unresponsive on bathroom floor this morning EXAM: CT HEAD WITHOUT CONTRAST TECHNIQUE: Contiguous axial images were obtained from the base of the skull through the vertex without intravenous contrast. COMPARISON:  10/23/2013 FINDINGS: The bony calvarium is intact. No acute soft tissue abnormality is noted. Chronic calcification of the right globe is seen. Changes of prior mastoidectomy are noted bilaterally and stable. No findings to suggest acute hemorrhage, acute infarction or space-occupying mass lesion are noted. IMPRESSION: Chronic changes without acute abnormality. Electronically Signed   By: Inez Catalina M.D.   On: 12/07/2015 16:36   Dg Chest Portable 1 View  12/07/2015  CLINICAL DATA:  Altered mental status.  Unwitnessed fall. EXAM: PORTABLE CHEST 1 VIEW COMPARISON:  11/23/2015 and 01/03/2014. FINDINGS: 1602 hours. There is stable cardiomegaly. The mediastinal contours are unchanged. The lungs appear clear. There is no pleural effusion or pneumothorax. No  acute osseous findings are seen. IMPRESSION: Stable cardiomegaly without evidence of acute process. Electronically Signed   By: Richardean Sale M.D.   On: 12/07/2015 16:29    My personal review of EKG: Rhythm NSR, no Acute ST changes   Assessment & Plan:     1. Encephalopathy. Most likely hepatic, although underlying seizures cannot be ruled out, patient will be admitted to stepdown unit. Lactulose 30gms scheduled add Xifaxin, loaded with Keppra and encrustation to oral, codominant narcotics and benzodiazepines, continue aspirin, he does not have any focal deficits at this time with head CT unremarkable. Will repeat ammonia level in the morning and monitor.  2. History of seizures. Noncompliant with Keppra. He has been counseled on compliance,-IV Keppra now been oral.  3. [name] [name] with cirrhosis and pancytopenia. No signs of bleeding or SBP, belly soft, he is afebrile, supportive care, for now Rocephin and monitor.  4. Chronic right foot ulcer. Question minimal cellulitis if any, blood cultures drawn, empiric vancomycin and Rocephin. Will taper down quickly. His ulcer is chronic for the last several years and unchanged. Outpatient follow-up.  5. COPD. At baseline. No wheezing. Supportive care.  6. Chronic pain and anxiety. Hold back narcotics and benzodiazepine, will start Ativan 0.5 daily at bedtime from tomorrow to avoid abrupt withdrawal.  7. Marijuana use. Concert quit.  8. DM type II. Check A1c, every 4 hours sliding scale.  9. GERD. Continue PPI.    DVT Prophylaxis Heparin    AM Labs Ordered, also please review Full Orders  Family Communication: Admission, patients condition and plan of care including tests being ordered have been  discussed with the patient and wife who indicate understanding and agree with the plan and Code Status.  Code Status DNR  Likely DC to  Home 1-2 days  Condition GUARDED     Consults called:  Neuro  Admission status: Stepdown  Time spent in  minutes : 35   Lala Lund K M.D on 12/07/2015 at 5:56 PM  Between 7am to 7pm - Pager - [phone]. After 7pm go to [email or web address] - password [email or web address]  [facility] - Office  [phone]

## 2015-12-07 NOTE — Consult Note (Signed)
Admission H&P    Chief Complaint: Altered mental status.  HPI: [name] [name] is an 55 y.o. male with a history of cirrhosis, hypertension, diabetes mellitus, hyperlipidemia, COPD, mental retardation and seizure disorder, brought to the emergency room with a history of increasing lethargy for the last several days. He also experienced a fall this morning and was unable to get back to his feet for several hours. No seizure activity was described. CT scan of his head was unremarkable with no acute intracranial abnormality. Ammonia level was elevated at 81. He takes lactulose daily. He's on Keppra for seizure control 500 mg twice a day. He has a chronic right foot ulcer with possible infection. He was started on antibiotics with Rocephin and vancomycin. Urine drug screen was positive for benzodiazepines and THC. He is on alprazolam for chronic anxiety. Mental status has improved since he arrived in the ED. He is now alert and less confused done earlier.  Past Medical History  Diagnosis Date  . Depression   . 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 Bipolar 1 disorder ([place])   . Hypertension   . Diabetes mellitus   . Seizures ([place])   . COPD (chronic obstructive pulmonary disease) ([place])   . Mental retardation   . Blind right eye   . GERD (gastroesophageal reflux disease)   . Chronic back pain   . Hepatic encephalopathy ([place])   . Anemia   . Thrombocytopenia ([place])   . Cardiomegaly   . Anxiety   . Arthritis   . Cholelithiasis     Asymptomatic  . Cerebrovascular disease     Right vertebral artery  . Hard of hearing   . Hyperlipidemia   . Cataract     Right eye  . Blood clots in brain     per patient  . Cirrhosis ([place])     diagnosed 07/2010 when presented with first episode of hepatic encephalopathy, afp on 416/13=4.8,  U/S on 12/14/11  liver stable, pt has received 2 hep A/B vaccines  . Diverticulosis     Past Surgical History  Procedure Laterality Date  . Tonsillectomy    . Ear  operations    . Cast application  10/25/3297    Procedure: MINOR CAST APPLICATION;  Surgeon: [name], MD;  Location: [facility];  Service: Orthopedics;  Laterality: Right;  no anesthesia , procedure room do not need or room !  . Orif ankle fracture  08/27/2011    Procedure: OPEN REDUCTION INTERNAL FIXATION (ORIF) ANKLE FRACTURE;  Surgeon: [name], MD;  Location: [facility];  Service: Orthopedics;  Laterality: Right;  . Esophagogastroduodenoscopy  02/02/2011    Rourk-Normal esophagus.  No varices/Question mild portal gastropathy, extrinsic compression on the antrum, lesser curvature of uncertain significance, some minimally  nodular mucosa status post biopsy, patent pylorus, normal duodenum 1 and duodenum 2.  . Colonoscopy  12/15/11    colonic divericulosis;poor prep, ACBE  recommended but has not been done  . Esophagogastroduodenoscopy  12/15/11    Rourk-->mild changes of portal gastropathy, gastric/duodenal erosions s/p bx  (reactive changes with mild chronic inflammation. No H.pylori  . Foot surgery      Family History  Problem Relation Age of Onset  . Heart failure Mother   . Thyroid disease Mother   . Cancer Father   . Asthma Brother   . Heart attack Brother   . Cirrhosis Brother     EtOH  . Colon cancer Maternal Aunt    Social History:  reports that he has been smoking Cigarettes.  He has a 30 pack-year smoking history. He does not have any smokeless tobacco history on file. He reports that he does not drink alcohol or use illicit drugs.  Allergies:  Allergies  Allergen Reactions  . Penicillins Rash    Medications: Patient's preadmission medications were reviewed by me.  ROS: Unavailable due to patient's mental status changes.  Physical Examination: Blood pressure 154/73, pulse 83, temperature 98.9 F (37.2 C), temperature source Rectal, resp. rate 12, weight 109.317 kg (241 lb), SpO2 98 %.  HEENT-  Normocephalic, no lesions, without obvious abnormality.  Normal  external eye and conjunctiva.  Normal TM's bilaterally.  Normal auditory canals and external ears. Normal external nose, mucus membranes and septum.  Normal pharynx. Neck supple with no masses, nodes, nodules or enlargement. Cardiovascular - regular rate and rhythm, S1, S2 normal, no murmur, click, rub or gallop Lungs - chest clear, no wheezing, rales, normal symmetric air entry Abdomen - soft, non-tender; bowel sounds normal; no masses,  no organomegaly Extremities - moderate edema distally in right and left lower extremities as well as the deformity and medial ulcer involving right foot.  Neurologic Examination: Mental Status: Alert, oriented to person and place but disoriented to time, no acute distress.  Speech dysarthric without evidence of aphasia. Able to follow commands without difficulty. Cranial Nerves: II-Visual fields were normal with use of left eye; no vision in right eye with opacification of the cornea. III/IV/VI-left pupil reacted normally to light. Extraocular movements were full and conjugate.    V/VII-no facial numbness and no facial weakness. VIII-normal. X-speech moderately dysarthric, with edentulous state noted; symmetrical palatal movement. XI: trapezius strength/neck flexion strength normal bilaterally XII-midline tongue extension with normal strength. Motor: 5/5 bilaterally with normal tone and bulk Sensory: Normal throughout. Deep Tendon Reflexes: 2+ and symmetric. Plantars: Mute bilaterally   Results for orders placed or performed during the hospital encounter of 12/07/15 (from the past 48 hour(s))  Comprehensive metabolic panel     Status: Abnormal   Collection Time: 12/07/15  2:04 PM  Result Value Ref Range   Sodium 142 135 - 145 mmol/L   Potassium 4.2 3.5 - 5.1 mmol/L   Chloride 109 101 - 111 mmol/L   CO2 23 22 - 32 mmol/L   Glucose, Bld 112 (H) 65 - 99 mg/dL   BUN 5 (L) 6 - 20 mg/dL   Creatinine, Ser 0.70 0.61 - 1.24 mg/dL   Calcium 8.9 8.9 - 10.3  mg/dL   Total Protein 5.9 (L) 6.5 - 8.1 g/dL   Albumin 2.8 (L) 3.5 - 5.0 g/dL   AST 62 (H) 15 - 41 U/L   ALT 20 17 - 63 U/L   Alkaline Phosphatase 103 38 - 126 U/L   Total Bilirubin 1.8 (H) 0.3 - 1.2 mg/dL   GFR calc non Af Amer >60 >60 mL/min   GFR calc Af Amer >60 >60 mL/min    Comment: (NOTE) The eGFR has been calculated using the CKD EPI equation. This calculation has not been validated in all clinical situations. eGFR's persistently <60 mL/min signify possible Chronic Kidney Disease.    Anion gap 10 5 - 15  CBC     Status: Abnormal   Collection Time: 12/07/15  2:04 PM  Result Value Ref Range   WBC 2.8 (L) 4.0 - 10.5 K/uL   RBC 3.47 (L) 4.22 - 5.81 MIL/uL   Hemoglobin 9.7 (L) 13.0 - 17.0 g/dL   HCT 30.9 (L)  39.0 - 52.0 %   MCV 89.0 78.0 - 100.0 fL   MCH 28.0 26.0 - 34.0 pg   MCHC 31.4 30.0 - 36.0 g/dL   RDW 17.7 (H) 11.5 - 15.5 %   Platelets 51 (L) 150 - 400 K/uL    Comment: PLATELET COUNT CONFIRMED BY SMEAR  Ammonia     Status: Abnormal   Collection Time: 12/07/15  2:04 PM  Result Value Ref Range   Ammonia 81 (H) 9 - 35 umol/L  CK     Status: Abnormal   Collection Time: 12/07/15  2:04 PM  Result Value Ref Range   Total CK 1027 (H) 49 - 397 U/L  I-Stat Arterial Blood Gas, ED - (order at [facility] and [facility] only)     Status: Abnormal   Collection Time: 12/07/15  2:23 PM  Result Value Ref Range   pH, Arterial 7.414 7.350 - 7.450   pCO2 arterial 37.5 35.0 - 45.0 mmHg   pO2, Arterial 78.0 (L) 80.0 - 100.0 mmHg   Bicarbonate 24.1 (H) 20.0 - 24.0 mEq/L   TCO2 25 0 - 100 mmol/L   O2 Saturation 96.0 %   Patient temperature 98.3 F    Collection site RADIAL, ALLEN'S TEST ACCEPTABLE    Drawn by RT    Sample type ARTERIAL   CBG monitoring, ED     Status: None   Collection Time: 12/07/15  2:24 PM  Result Value Ref Range   Glucose-Capillary 84 65 - 99 mg/dL  Urine rapid drug screen (hosp performed)     Status: Abnormal   Collection Time: 12/07/15  2:30 PM  Result Value Ref Range    Opiates NONE DETECTED NONE DETECTED   Cocaine NONE DETECTED NONE DETECTED   Benzodiazepines POSITIVE (A) NONE DETECTED   Amphetamines NONE DETECTED NONE DETECTED   Tetrahydrocannabinol POSITIVE (A) NONE DETECTED   Barbiturates NONE DETECTED NONE DETECTED    Comment:        DRUG SCREEN FOR MEDICAL PURPOSES ONLY.  IF CONFIRMATION IS NEEDED FOR ANY PURPOSE, NOTIFY LAB WITHIN 5 DAYS.        LOWEST DETECTABLE LIMITS FOR URINE DRUG SCREEN Drug Class       Cutoff (ng/mL) Amphetamine      1000 Barbiturate      200 Benzodiazepine   703 Tricyclics       500 Opiates          300 Cocaine          300 THC              50   Urinalysis, Routine w reflex microscopic (not at [facility])     Status: Abnormal   Collection Time: 12/07/15  2:30 PM  Result Value Ref Range   Color, Urine YELLOW YELLOW   APPearance CLEAR CLEAR   Specific Gravity, Urine 1.011 1.005 - 1.030   pH 6.0 5.0 - 8.0   Glucose, UA NEGATIVE NEGATIVE mg/dL   Hgb urine dipstick SMALL (A) NEGATIVE   Bilirubin Urine NEGATIVE NEGATIVE   Ketones, ur NEGATIVE NEGATIVE mg/dL   Protein, ur NEGATIVE NEGATIVE mg/dL   Nitrite NEGATIVE NEGATIVE   Leukocytes, UA NEGATIVE NEGATIVE  Urine microscopic-add on     Status: Abnormal   Collection Time: 12/07/15  2:30 PM  Result Value Ref Range   Squamous Epithelial / LPF 0-5 (A) NONE SEEN   WBC, UA 0-5 0 - 5 WBC/hpf   RBC / HPF 0-5 0 - 5 RBC/hpf   Bacteria,  UA NONE SEEN NONE SEEN  I-Stat CG4 Lactic Acid, ED     Status: Abnormal   Collection Time: 12/07/15  3:16 PM  Result Value Ref Range   Lactic Acid, Venous 2.24 (HH) 0.5 - 2.0 mmol/L   Comment NOTIFIED PHYSICIAN    Ct Head Wo Contrast  12/07/2015  CLINICAL DATA:  Altered mental status, found unresponsive on bathroom floor this morning EXAM: CT HEAD WITHOUT CONTRAST TECHNIQUE: Contiguous axial images were obtained from the base of the skull through the vertex without intravenous contrast. COMPARISON:  10/23/2013 FINDINGS: The bony calvarium  is intact. No acute soft tissue abnormality is noted. Chronic calcification of the right globe is seen. Changes of prior mastoidectomy are noted bilaterally and stable. No findings to suggest acute hemorrhage, acute infarction or space-occupying mass lesion are noted. IMPRESSION: Chronic changes without acute abnormality. Electronically Signed   By: Inez Catalina M.D.   On: 12/07/2015 16:36   Dg Chest Portable 1 View  12/07/2015  CLINICAL DATA:  Altered mental status.  Unwitnessed fall. EXAM: PORTABLE CHEST 1 VIEW COMPARISON:  11/23/2015 and 01/03/2014. FINDINGS: 1602 hours. There is stable cardiomegaly. The mediastinal contours are unchanged. The lungs appear clear. There is no pleural effusion or pneumothorax. No acute osseous findings are seen. IMPRESSION: Stable cardiomegaly without evidence of acute process. Electronically Signed   By: Richardean Sale M.D.   On: 12/07/2015 16:29    Assessment/Plan 55 year old man with multiple medical problems as indicated above, including liver cirrhosis and history of hepatic encephalopathy, presenting with altered mental status. Etiology is unclear, but likely secondary to multiple factors, including hepatic encephalopathy with elevated ammonia level, as well as multiple cellulitis involving the right foot and possible sepsis, and addition to baseline mental retardation. No seizure activity was reported. Recurrent seizure with postictal confusion is unlikely.  Recommendations: 1. MRI of the brain without contrast rule out possible acute stroke 2. EEG, routine adult study, to assess severity of encephalopathy as well as to rule out focal seizure activity 3. Antibiotic management per primary admitting team and pharmacy service 4. No change in current dose of Keppra recommended  We will continue to follow this patient with you.  C.R. Nicole Kindred, [facility] [phone]  12/07/2015, 7:44 PM

## 2015-12-07 NOTE — ED Provider Notes (Addendum)
CSN: [id number]     Arrival date & time 12/07/15  1336 History   First MD Initiated Contact with Patient 12/07/15 1401     Chief Complaint  Patient presents with  . Altered Mental Status  . Fall    Level V caveat secondary to acuity of condition and patient not responsive HPI 55 year old male with reported history of depression, cirrhosis, seizures, diabetes who presents today via EMS with reports that he has an altered mental status. EMS reports that a woman at the scene reported he was laying on the bathroom floor at 3 AM. She checked again at 7 AM and he was still on the floor and at noon she checked and he was still on the floor so she called EMS. No additional history is available. Past Medical History  Diagnosis Date  . Depression   . 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 Bipolar 1 disorder ([employer])   . Hypertension   . Diabetes mellitus   . Seizures ([place])   . COPD (chronic obstructive pulmonary disease) ([place])   . Mental retardation   . Blind right eye   . GERD (gastroesophageal reflux disease)   . Chronic back pain   . Hepatic encephalopathy ([place])   . Anemia   . Thrombocytopenia ([place])   . Cardiomegaly   . Anxiety   . Arthritis   . Cholelithiasis     Asymptomatic  . Cerebrovascular disease     Right vertebral artery  . Hard of hearing   . Hyperlipidemia   . Cataract     Right eye  . Blood clots in brain     per patient  . Cirrhosis ([place])     diagnosed 07/2010 when presented with first episode of hepatic encephalopathy, afp on 416/13=4.8,  U/S on 12/14/11  liver stable, pt has received 2 hep A/B vaccines  . Diverticulosis    Past Surgical History  Procedure Laterality Date  . Tonsillectomy    . Ear operations    . Cast application  0/10/4915    Procedure: MINOR CAST APPLICATION;  Surgeon: [name], MD;  Location: [facility];  Service: Orthopedics;  Laterality: Right;  no anesthesia , procedure room do not need or room !  . Orif ankle fracture  08/27/2011   Procedure: OPEN REDUCTION INTERNAL FIXATION (ORIF) ANKLE FRACTURE;  Surgeon: [name], MD;  Location: [facility];  Service: Orthopedics;  Laterality: Right;  . Esophagogastroduodenoscopy  02/02/2011    Rourk-Normal esophagus.  No varices/Question mild portal gastropathy, extrinsic compression on the antrum, lesser curvature of uncertain significance, some minimally  nodular mucosa status post biopsy, patent pylorus, normal duodenum 1 and duodenum 2.  . Colonoscopy  12/15/11    colonic divericulosis;poor prep, ACBE  recommended but has not been done  . Esophagogastroduodenoscopy  12/15/11    Rourk-->mild changes of portal gastropathy, gastric/duodenal erosions s/p bx  (reactive changes with mild chronic inflammation. No H.pylori  . Foot surgery     Family History  Problem Relation Age of Onset  . Heart failure Mother   . Thyroid disease Mother   . Cancer Father   . Asthma Brother   . Heart attack Brother   . Cirrhosis Brother     EtOH  . Colon cancer Maternal Aunt    Social History  Substance Use Topics  . Smoking status: Current Every Day Smoker -- 1.00 packs/day for 30 years    Types: Cigarettes  . Smokeless tobacco: None  Comment: only smokes about 1-2 a day  . Alcohol Use: No    Review of Systems  Unable to perform ROS: Mental status change      Allergies  Penicillins  Home Medications   Prior to Admission medications   Medication Sig Start Date End Date Taking? Authorizing Provider  ALPRAZolam Duanne Moron) 0.5 MG tablet Take 0.5 mg by mouth 3 (three) times daily as needed for anxiety.    Historical Provider, MD  aspirin EC 81 MG tablet Take 81 mg by mouth daily.    Historical Provider, MD  Cholecalciferol (VITAMIN D) 2000 UNITS tablet Take 1 tablet (2,000 Units total) by mouth daily. 09/20/12   Nimish Luther Parody, MD  fish oil-omega-3 fatty acids 1000 MG capsule Take 1 g by mouth 3 (three) times daily.    Historical Provider, MD  FLUoxetine (PROZAC) 40 MG capsule Take  40 mg by mouth daily. 10/26/13   Historical Provider, MD  furosemide (LASIX) 20 MG tablet Take 20 mg by mouth daily.    Historical Provider, MD  glimepiride (AMARYL) 2 MG tablet Take 2 mg by mouth daily before breakfast.    Historical Provider, MD  HYDROcodone-acetaminophen (NORCO/VICODIN) 5-325 MG tablet Take 1 tablet by mouth 3 (three) times daily as needed. For pain 11/10/15   Historical Provider, MD  lactulose (CHRONULAC) 10 GM/15ML solution Take 60 mLs (40 g total) by mouth 3 (three) times daily. Titrate for 3-4 bowel motions daily. 01/03/14   Carmin Muskrat, MD  levETIRAcetam (KEPPRA) 500 MG tablet Take 500 mg by mouth 2 (two) times daily.    Historical Provider, MD  lidocaine (XYLOCAINE) 2 % jelly Apply 1 application topically as needed. Irritation/pain    Historical Provider, MD  metoprolol tartrate (LOPRESSOR) 25 MG tablet Take 25-50 mg by mouth 2 (two) times daily. Take 25 mg in the morning and 50 mg at night    Historical Provider, MD  Multiple Vitamin (MULTIVITAMIN WITH MINERALS) TABS Take 1 tablet by mouth daily.    Historical Provider, MD  omeprazole (PRILOSEC) 20 MG capsule Take 1 capsule (20 mg total) by mouth daily. 09/20/12   Nimish Luther Parody, MD  potassium chloride SA (K-DUR,KLOR-CON) 20 MEQ tablet Take 1 tablet (20 mEq total) by mouth daily. 09/20/12   Nimish C Anastasio Champion, MD   BP 141/63 mmHg  Pulse 110  Temp(Src) 98.9 F (37.2 C) (Rectal)  Resp 13  Wt 109.317 kg  SpO2 100% Physical Exam  Constitutional: He appears well-developed.  Morbidly obese  HENT:  Head: Normocephalic and atraumatic.  Cataract right eye left eye pupil mid sized  Eyes: Conjunctivae are normal.  Neck: Normal range of motion. Neck supple. No JVD present. No tracheal deviation present. No thyromegaly present.  Cardiovascular: Tachycardia present.   Pulmonary/Chest: Effort normal and breath sounds normal.  Abdominal: Soft. Bowel sounds are normal.  Musculoskeletal:  Right ankle appears to have chronic  dislocation There is some erythema consistent with cellulitis in the right lower extremity Left lower leg is rigid  Neurological:  Patient mumbles some incoherent answers with verbal questioning. He does not appear to follow any commands. Bilateral upper extremities are flaccid. Left lower extremity is rigid and clonus is present on the patellar reflex Right patella also reveals clonus  Skin: Skin is warm. Rash noted.  Nursing note and vitals reviewed.   ED Course  Procedures (including critical care time) Labs Review Labs Reviewed  COMPREHENSIVE METABOLIC PANEL - Abnormal; Notable for the following:    Glucose, Bld 112 (*)  BUN 5 (*)    Total Protein 5.9 (*)    Albumin 2.8 (*)    AST 62 (*)    Total Bilirubin 1.8 (*)    All other components within normal limits  CBC - Abnormal; Notable for the following:    WBC 2.8 (*)    RBC 3.47 (*)    Hemoglobin 9.7 (*)    HCT 30.9 (*)    RDW 17.7 (*)    Platelets 51 (*)    All other components within normal limits  URINE RAPID DRUG SCREEN, HOSP PERFORMED - Abnormal; Notable for the following:    Benzodiazepines POSITIVE (*)    Tetrahydrocannabinol POSITIVE (*)    All other components within normal limits  URINALYSIS, ROUTINE W REFLEX MICROSCOPIC (NOT AT [facility]) - Abnormal; Notable for the following:    Hgb urine dipstick SMALL (*)    All other components within normal limits  AMMONIA - Abnormal; Notable for the following:    Ammonia 81 (*)    All other components within normal limits  CK - Abnormal; Notable for the following:    Total CK 1027 (*)    All other components within normal limits  URINE MICROSCOPIC-ADD ON - Abnormal; Notable for the following:    Squamous Epithelial / LPF 0-5 (*)    All other components within normal limits  I-STAT ARTERIAL BLOOD GAS, ED - Abnormal; Notable for the following:    pO2, Arterial 78.0 (*)    Bicarbonate 24.1 (*)    All other components within normal limits  I-STAT CG4 LACTIC ACID, ED -  Abnormal; Notable for the following:    Lactic Acid, Venous 2.24 (*)    All other components within normal limits  CULTURE, BLOOD (ROUTINE X 2)  CULTURE, BLOOD (ROUTINE X 2)  ETHANOL  BLOOD GAS, ARTERIAL  CBG MONITORING, ED    Imaging Review No results found. I have personally reviewed and evaluated these images and lab results as part of my medical decision-making.   EKG Interpretation   Date/Time:  Sunday December 07 2015 13:42:58 EDT Ventricular Rate:  108 PR Interval:  139 QRS Duration: 78 QT Interval:  325 QTC Calculation: 436 R Axis:   73 Text Interpretation:  Sinus tachycardia HEART RATE INCREASED SINCE 03 Jan 2014 Confirmed by Jeanell Sparrow MD, Andee Poles [phone]) on 12/07/2015 2:02:17 PM      MDM   Final diagnoses:  Altered mental status, unspecified altered mental status type    DDX Intracerebral abnormality-ct ordere Seizure- patient with history of seizure-med list states keppra Toxin- etoh, drugs of abuse- panel sent, od of meds, side effects of meds  UDS positive for thc and benzos  Elevated ammonia--lactulose ordered Infection-leukopenia,  Possible cellulitis rle- antibiotics given  Metabolic- patient with history of cirrhosis- ammonia ordered, electrolytes pending Hypercarbia?-not present on abg  Discussed with Dr. Vanita Panda and to be admitted pending ct and cxr results.     Pattricia Boss, MD 12/07/15 [place], MD 12/07/15 [phone]

## 2015-12-07 NOTE — ED Notes (Signed)
This RN accompanied patient to and from CT 2 without incident.

## 2015-12-07 NOTE — ED Notes (Signed)
[facility]- pt here from home after an unwitnessed fall. Pt found in bathroom by family member at approximately 0700 with unknown downtime. Family didn't call EMS until 1200. Unknown LSN. Pt alert and oriented to self and place. Pt confused. Hx of chirrosis of the liver, CHF. DNR at bedside.

## 2015-12-07 NOTE — ED Notes (Signed)
Dr. Vanita Panda at bedside providing update to family.

## 2015-12-08 ENCOUNTER — Inpatient Hospital Stay (HOSPITAL_COMMUNITY): Payer: Medicare HMO

## 2015-12-08 DIAGNOSIS — K7290 Hepatic failure, unspecified without coma: Secondary | ICD-10-CM

## 2015-12-08 DIAGNOSIS — R4182 Altered mental status, unspecified: Secondary | ICD-10-CM

## 2015-12-08 LAB — COMPREHENSIVE METABOLIC PANEL
ALBUMIN: 2.6 g/dL — AB (ref 3.5–5.0)
ALK PHOS: 91 U/L (ref 38–126)
ALT: 20 U/L (ref 17–63)
AST: 68 U/L — AB (ref 15–41)
Anion gap: 8 (ref 5–15)
BILIRUBIN TOTAL: 2.2 mg/dL — AB (ref 0.3–1.2)
CALCIUM: 8.4 mg/dL — AB (ref 8.9–10.3)
CO2: 24 mmol/L (ref 22–32)
Chloride: 110 mmol/L (ref 101–111)
Creatinine, Ser: 0.75 mg/dL (ref 0.61–1.24)
GFR calc Af Amer: >60 mL/min (ref >60)
GFR calc non Af Amer: >60 mL/min (ref >60)
Glucose, Bld: 93 mg/dL (ref 65–99)
Potassium: 3.5 mmol/L (ref 3.5–5.1)
Sodium: 142 mmol/L (ref 135–145)
TOTAL PROTEIN: 5.3 g/dL — AB (ref 6.5–8.1)

## 2015-12-08 LAB — GLUCOSE, CAPILLARY
Glucose-Capillary: 89 mg/dL (ref 65–99)
Glucose-Capillary: 92 mg/dL (ref 65–99)
Glucose-Capillary: 87 mg/dL (ref 65–99)
Glucose-Capillary: 86 mg/dL (ref 65–99)
Glucose-Capillary: 89 mg/dL (ref 65–99)

## 2015-12-08 LAB — AMMONIA: Ammonia: 111 umol/L — ABNORMAL HIGH (ref 9–35)

## 2015-12-08 LAB — CREATININE, SERUM
CREATININE: 0.76 mg/dL (ref 0.61–1.24)
GFR calc Af Amer: >60 mL/min (ref >60)
GFR calc non Af Amer: >60 mL/min (ref >60)

## 2015-12-08 LAB — MRSA PCR SCREENING: MRSA by PCR: NEGATIVE

## 2015-12-08 LAB — ETHANOL

## 2015-12-08 LAB — CBC
HCT: 28.6 % — ABNORMAL LOW (ref 39.0–52.0)
Hemoglobin: 8.7 g/dL — ABNORMAL LOW (ref 13.0–17.0)
MCH: 27 pg (ref 26.0–34.0)
MCHC: 30.4 g/dL (ref 30.0–36.0)
MCV: 88.8 fL (ref 78.0–100.0)
PLATELETS: 51 10*3/uL — AB (ref 150–400)
RBC: 3.22 MIL/uL — AB (ref 4.22–5.81)
RDW: 17.8 % — AB (ref 11.5–15.5)
WBC: 3.2 10*3/uL — ABNORMAL LOW (ref 4.0–10.5)

## 2015-12-08 LAB — TSH: TSH: 1.995 u[IU]/mL (ref 0.350–4.500)

## 2015-12-08 LAB — PROTIME-INR
INR: 1.54 — ABNORMAL HIGH (ref 0.00–1.49)
Prothrombin Time: 18.5 seconds — ABNORMAL HIGH (ref 11.6–15.2)

## 2015-12-08 LAB — CBG MONITORING, ED: Glucose-Capillary: 79 mg/dL (ref 65–99)

## 2015-12-08 MED ORDER — LACTULOSE ENEMA
300.0000 mL | Freq: Once | ORAL | Status: AC
  Administered 2015-12-08: 300 mL via RECTAL
  Filled 2015-12-08 (×2): qty 300

## 2015-12-08 MED ORDER — LACTULOSE ENEMA
300.0000 mL | Freq: Three times a day (TID) | ORAL | Status: DC
  Filled 2015-12-08: qty 300

## 2015-12-08 MED ORDER — POTASSIUM CHLORIDE CRYS ER 20 MEQ PO TBCR
40.0000 meq | EXTENDED_RELEASE_TABLET | Freq: Once | ORAL | Status: AC
  Administered 2015-12-08: 40 meq via ORAL
  Filled 2015-12-08: qty 2

## 2015-12-08 MED ORDER — ASPIRIN 81 MG PO CHEW
81.0000 mg | CHEWABLE_TABLET | Freq: Every day | ORAL | Status: DC
  Administered 2015-12-09 – 2015-12-10 (×2): 81 mg via ORAL
  Filled 2015-12-08 (×2): qty 1

## 2015-12-08 MED ORDER — INSULIN ASPART 100 UNIT/ML ~~LOC~~ SOLN: 0.0000 [IU] | Freq: Three times a day (TID) | SUBCUTANEOUS | Status: DC

## 2015-12-08 MED ORDER — LEVETIRACETAM 500 MG PO TABS
500.0000 mg | ORAL_TABLET | ORAL | Status: DC
  Administered 2015-12-08 – 2015-12-10 (×4): 500 mg via ORAL
  Filled 2015-12-08 (×4): qty 1

## 2015-12-08 MED ORDER — INSULIN ASPART 100 UNIT/ML ~~LOC~~ SOLN: 0.0000 [IU] | Freq: Every day | SUBCUTANEOUS | Status: DC

## 2015-12-08 NOTE — Evaluation (Signed)
Physical Therapy Evaluation Patient Details Name: [name] [name] MRN: [id number] DOB: 12-Apr-1961 Today's Date: 12/08/2015   History of Present Illness  Pt adm with encephalopathy most likely hepatic. PMH - seizures, chronic rt foot ulcer, DM, COPD, mental retardation, HTN, NASH cirrhosis, chronic back pain  Clinical Impression  Pt admitted with above diagnosis and presents to PT with functional limitations due to deficits listed below (See PT problem list). Pt needs skilled PT to maximize independence and safety to allow discharge to home with wife. Will follow acutely but don't feel pt will need further PT at DC.     Follow Up Recommendations No PT follow up;Supervision - Intermittent    Equipment Recommendations  None recommended by PT    Recommendations for Other Services       Precautions / Restrictions Precautions Precautions: Fall Restrictions Weight Bearing Restrictions: No      Mobility  Bed Mobility Overal bed mobility: Needs Assistance Bed Mobility: Supine to Sit     Supine to sit: Supervision;HOB elevated     General bed mobility comments: Incr time  Transfers Overall transfer level: Needs assistance Equipment used: Rolling walker (2 wheeled) Transfers: Sit to/from Stand Sit to Stand: Min guard         General transfer comment: Assist for safety  Ambulation/Gait Ambulation/Gait assistance: Min guard Ambulation Distance (Feet): 40 Feet Assistive device: Rolling walker (2 wheeled) Gait Pattern/deviations: Step-through pattern;Decreased stance time - right;Decreased step length - left;Antalgic Gait velocity: decr Gait velocity interpretation: Below normal speed for age/gender General Gait Details: Assist for safety. Distance limited by chronic rt foot pain  Stairs            Wheelchair Mobility    Modified Rankin (Stroke Patients Only)       Balance Overall balance assessment: Needs assistance Sitting-balance support: No upper  extremity supported;Feet supported Sitting balance-Leahy Scale: Good     Standing balance support: No upper extremity supported;During functional activity Standing balance-Leahy Scale: Fair                               Pertinent Vitals/Pain      Home Living Family/patient expects to be discharged to:: Private residence Living Arrangements: Spouse/significant other Available Help at Discharge: Family Type of Home: Apartment Home Access: Level entry     Home Layout: One level Home Equipment: Environmental consultant - 2 wheels;Cane - single point Additional Comments: Information from prior encounter    Prior Function Level of Independence: Needs assistance   Gait / Transfers Assistance Needed: amb modified independent with straight cane  ADL's / Homemaking Assistance Needed: Wife assist due to poor vision        Hand Dominance        Extremity/Trunk Assessment   Upper Extremity Assessment: Overall WFL for tasks assessed           Lower Extremity Assessment: Generalized weakness         Communication   Communication: HOH  Cognition Arousal/Alertness: Awake/alert Behavior During Therapy: WFL for tasks assessed/performed Overall Cognitive Status: No family/caregiver present to determine baseline cognitive functioning                      General Comments      Exercises        Assessment/Plan    PT Assessment Patient needs continued PT services  PT Diagnosis Generalized weakness;Difficulty walking   PT Problem List Decreased strength;Decreased balance;Decreased  mobility  PT Treatment Interventions DME instruction;Gait training;Functional mobility training;Therapeutic activities;Therapeutic exercise;Balance training;Patient/family education   PT Goals (Current goals can be found in the Care Plan section) Acute Rehab PT Goals Patient Stated Goal: Return home PT Goal Formulation: With patient Time For Goal Achievement: 12/15/15 Potential to  Achieve Goals: Good    Frequency Min 3X/week   Barriers to discharge        Co-evaluation               End of Session Equipment Utilized During Treatment: Gait belt Activity Tolerance: Patient tolerated treatment well Patient left: in chair;with call bell/phone within reach;with chair alarm set Nurse Communication: Mobility status         Time: 2194-7125 PT Time Calculation (min) (ACUTE ONLY): 20 min   Charges:   PT Evaluation $PT Eval Moderate Complexity: 1 Procedure     PT G Codes:        Sulay Brymer 12-20-15, 5:42 PM [facility] PT [phone]

## 2015-12-08 NOTE — Care Management Note (Addendum)
Case Management Note  Patient Details  Name: DEVAUNTE GASPARINI MRN: [id number] Date of Birth: 1960/12/16  Subjective/Objective:  Patient is from home with daughter , Hep encephalopathy ,ammonia level elevated for MRI and EEG today. has home oxygen as well. conts on iv steroids, patient also on xifaxan , rep came over to see NCM but patient has been transferred to floor.                  Action/Plan:   Expected Discharge Date:                  Expected Discharge Plan:  Home/Self Care  In-House Referral:     Discharge planning Services  CM Consult  Post Acute Care Choice:    Choice offered to:     DME Arranged:    DME Agency:     HH Arranged:    HH Agency:     Status of Service:  In process, will continue to follow  Medicare Important Message Given:    Date Medicare IM Given:    Medicare IM give by:    Date Additional Medicare IM Given:    Additional Medicare Important Message give by:     If discussed at [place] of Stay Meetings, dates discussed:    Additional Comments:  Zenon Mayo, RN 12/08/2015, 2:21 PM

## 2015-12-08 NOTE — Care Management Note (Addendum)
Case Management Note  Patient Details  Name: [name] [name] MRN: [id number] Date of Birth: 10-15-1960  Subjective/Objective:                 Spoke with patient's wife over the phone with permission from patient. Patient and wife live in [place], [place]. Patient has a BSC and uses a walker at home. PCP is Mishi Glennon Mac at [facility], [place]. Wife denies any difficulties obtaining meds and drives pt to appointments. Xifaxan drug covereage form for 1 week coverage faxed in at 3:15. Addendum- Xifaxan form received per rep, approved, and 1 week vouvher will be sent to pharmacy. Auth form to be faxed to CM to fill and return.    Action/Plan:  Xifaxan assistance at DC.   Expected Discharge Date:                  Expected Discharge Plan:  Home/Self Care  In-House Referral:     Discharge planning Services  CM Consult, Medication Assistance  Post Acute Care Choice:    Choice offered to:     DME Arranged:    DME Agency:     HH Arranged:    HH Agency:     Status of Service:  In process, will continue to follow  Medicare Important Message Given:    Date Medicare IM Given:    Medicare IM give by:    Date Additional Medicare IM Given:    Additional Medicare Important Message give by:     If discussed at [place] of Stay Meetings, dates discussed:    Additional Comments:  Carles Collet, RN 12/08/2015, 3:29 PM

## 2015-12-08 NOTE — Progress Notes (Signed)
PROGRESS NOTE                                                                                                                                                                                                             Patient Demographics:    [name] [name], is a 55 y.o. male, DOB - 05/05/1961, ZDG:[id number]  Admit date - 12/07/2015   Admitting Physician Thurnell Lose, MD  Outpatient Primary MD for the patient is DANIEL,JANICE, NP  LOS - 1  Outpatient Specialists: GI group at [facility] Complaint  Patient presents with  . Altered Mental Status  . Fall       Brief Narrative     Subjective:    [name] today has, No headache, No chest pain, No abdominal pain - No Nausea, No new weakness tingling or numbness, No Cough - SOB.     Assessment  & Plan :     1. Encephalopathy. Most likely hepatic, although underlying seizures cannot be ruled out, he is much improved despite his ammonia levels being further elevated than on the day of admission, question if it was underlying seizure which has improved with Keppra which was given IV loading dose in the ER.  Continue lactulose and Xifaxan, continue Keppra, CT head nonacute, neurology following. MRI and EEG pending. Mentation much better than on the day of admission. We will increase activity, PT eval, cleared by speech for oral diet.  2. History of seizures. Noncompliant with Keppra. He has been counseled on compliance,-IV Keppra now been oral.  3. NASH with cirrhosis and pancytopenia. No signs of bleeding or SBP, belly soft, he is afebrile, supportive care, for now Rocephin and monitor.  4. Chronic right foot ulcer. Question minimal cellulitis if any, blood cultures drawn, empiric vancomycin and Rocephin. Will taper down quickly. His ulcer is chronic for the last several years and unchanged. Outpatient follow-up.  5. COPD. At baseline. No wheezing.  Supportive care.  6. Chronic pain and anxiety. Hold back narcotics and benzodiazepine, will start Ativan 0.5 daily at bedtime from tomorrow to avoid abrupt withdrawal.  7. Marijuana use. Counseled to quit, he says his wife provided him with marijuana on the day of admission.  8. GERD. Continue PPI.  9. DM type II. Check A1c, every 4 hours sliding scale.  CBG (last 3)  Recent Labs  12/08/15 0126 12/08/15 0431 12/08/15 0840  GLUCAP 79 89 92     Code Status : DO NOT RESUSCITATE  Family Communication  : Wife on the day of admission  Disposition Plan  : Likely discharge in 1-2 days if stable  Barriers For Discharge : Encephalopathy  Consults  : Neuro  Procedures  :   CT head nonacute  MRI brain  EEG  DVT Prophylaxis  :   Heparin    Lab Results  Component Value Date   PLT 51* 12/07/2015    Antibiotics  :     Anti-infectives    Start     Dose/Rate Route Frequency Ordered Stop   12/08/15 0200  vancomycin (VANCOCIN) 1,250 mg in sodium chloride 0.9 % 250 mL IVPB     1,250 mg 166.7 mL/hr over 90 Minutes Intravenous Every 12 hours 12/07/15 1805     12/07/15 1800  rifaximin (XIFAXAN) tablet 550 mg     550 mg Oral 3 times per day 12/07/15 1751     12/07/15 1800  cefTRIAXone (ROCEPHIN) 1 g in dextrose 5 % 50 mL IVPB     1 g 100 mL/hr over 30 Minutes Intravenous Every 24 hours 12/07/15 1751     12/07/15 1500  piperacillin-tazobactam (ZOSYN) IVPB 3.375 g     3.375 g 12.5 mL/hr over 240 Minutes Intravenous  Once 12/07/15 1449 12/07/15 1851   12/07/15 1500  vancomycin (VANCOCIN) IVPB 1000 mg/200 mL premix     1,000 mg 200 mL/hr over 60 Minutes Intravenous  Once 12/07/15 1449 12/07/15 1700        Objective:   Filed Vitals:   12/08/15 0245 12/08/15 0315 12/08/15 0330 12/08/15 0841  BP: 122/69 157/86 157/77 129/78  Pulse: 96 80 86 93  Temp:   98.3 F (36.8 C) 98.4 F (36.9 C)  TempSrc:   Oral Oral  Resp:   12 19  Height:   5' 7"  (1.702 m)   Weight:   107.2 kg  (236 lb 5.3 oz)   SpO2: 95% 92% 98% 100%    Wt Readings from Last 3 Encounters:  12/08/15 107.2 kg (236 lb 5.3 oz)  02/14/14 72.576 kg (160 lb)  10/24/13 88.5 kg (195 lb 1.7 oz)     Intake/Output Summary (Last 24 hours) at 12/08/15 1013 Last data filed at 12/08/15 0622  Gross per 24 hour  Intake    460 ml  Output   1000 ml  Net   -540 ml     Physical Exam  Awake Alert, Oriented X 2, No new F.N deficits, Normal affect [place].AT,PERRAL Supple Neck,No JVD, No cervical lymphadenopathy appriciated.  Symmetrical Chest wall movement, Good air movement bilaterally, CTAB RRR,No Gallops,Rubs or new Murmurs, No Parasternal Heave +ve B.Sounds, Abd Soft, No tenderness, No organomegaly appriciated, No rebound - guarding or rigidity. No Cyanosis, Clubbing or edema, No new Rash or bruise       Data Review:    CBC  Recent Labs Lab 12/07/15 1404 12/07/15 2347  WBC 2.8* 3.2*  HGB 9.7* 8.7*  HCT 30.9* 28.6*  PLT 51* 51*  MCV 89.0 88.8  MCH 28.0 27.0  MCHC 31.4 30.4  RDW 17.7* 17.8*    Chemistries   Recent Labs Lab 12/07/15 1404 12/07/15 2347 12/08/15 0419  NA 142  --  142  K 4.2  --  3.5  CL 109  --  110  CO2 23  --  24  GLUCOSE 112*  --  93  BUN 5*  --  <5*  CREATININE 0.70 0.76 0.75  CALCIUM 8.9  --  8.4*  AST 62*  --  68*  ALT 20  --  20  ALKPHOS 103  --  91  BILITOT 1.8*  --  2.2*   ------------------------------------------------------------------------------------------------------------------ No results for input(s): CHOL, HDL, LDLCALC, TRIG, CHOLHDL, LDLDIRECT in the last 72 hours.  Lab Results  Component Value Date   HGBA1C 5.1 04/18/2013   ------------------------------------------------------------------------------------------------------------------  Recent Labs  12/07/15 2347  TSH 1.995   ------------------------------------------------------------------------------------------------------------------ No results for input(s): VITAMINB12,  FOLATE, FERRITIN, TIBC, IRON, RETICCTPCT in the last 72 hours.  Coagulation profile  Recent Labs Lab 12/07/15 2347  INR 1.54*    No results for input(s): DDIMER in the last 72 hours.  Cardiac Enzymes No results for input(s): CKMB, TROPONINI, MYOGLOBIN in the last 168 hours.  Invalid input(s): CK ------------------------------------------------------------------------------------------------------------------ No results found for: BNP  Inpatient Medications  Scheduled Meds: . ALPRAZolam  0.5 mg Oral QHS  . aspirin  150 mg Rectal Daily  . cefTRIAXone (ROCEPHIN)  IV  1 g Intravenous Q24H  . heparin  5,000 Units Subcutaneous 3 times per day  . insulin aspart  0-9 Units Subcutaneous Q4H  . lactulose  30 g Oral Q6H  . levETIRAcetam  500 mg Intravenous Q12H  . pantoprazole  40 mg Oral Daily  . rifaximin  550 mg Oral 3 times per day  . sodium chloride flush  3 mL Intravenous Q12H  . vancomycin  1,250 mg Intravenous Q12H   Continuous Infusions:  PRN Meds:.ondansetron **OR** ondansetron (ZOFRAN) IV  Micro Results Recent Results (from the past 240 hour(s))  MRSA PCR Screening     Status: None   Collection Time: 12/08/15  3:52 AM  Result Value Ref Range Status   MRSA by PCR NEGATIVE NEGATIVE Final    Comment:        The GeneXpert MRSA Assay (FDA approved for NASAL specimens only), is one component of a comprehensive MRSA colonization surveillance program. It is not intended to diagnose MRSA infection nor to guide or monitor treatment for MRSA infections.     Radiology Reports Dg Chest 2 View  11/23/2015  CLINICAL DATA:  Altered mental status, confusion EXAM: CHEST  2 VIEW COMPARISON:  01/03/2014 FINDINGS: Rotated exam to the right. Cardiomegaly evident without CHF or definite pneumonia. No collapse or consolidation. Negative for edema or pneumothorax. Monitor leads overlie the chest. No acute osseous finding. IMPRESSION: Rotated exam. Mild cardiomegaly without acute  process. No significant interval change. Electronically Signed   By: Jerilynn Mages.  Shick M.D.   On: 11/23/2015 14:04   Ct Head Wo Contrast  12/07/2015  CLINICAL DATA:  Altered mental status, found unresponsive on bathroom floor this morning EXAM: CT HEAD WITHOUT CONTRAST TECHNIQUE: Contiguous axial images were obtained from the base of the skull through the vertex without intravenous contrast. COMPARISON:  10/23/2013 FINDINGS: The bony calvarium is intact. No acute soft tissue abnormality is noted. Chronic calcification of the right globe is seen. Changes of prior mastoidectomy are noted bilaterally and stable. No findings to suggest acute hemorrhage, acute infarction or space-occupying mass lesion are noted. IMPRESSION: Chronic changes without acute abnormality. Electronically Signed   By: Inez Catalina M.D.   On: 12/07/2015 16:36   Dg Chest Portable 1 View  12/07/2015  CLINICAL DATA:  Altered mental status.  Unwitnessed fall. EXAM: PORTABLE CHEST 1 VIEW COMPARISON:  11/23/2015 and 01/03/2014. FINDINGS: 1602 hours. There is stable cardiomegaly. The  mediastinal contours are unchanged. The lungs appear clear. There is no pleural effusion or pneumothorax. No acute osseous findings are seen. IMPRESSION: Stable cardiomegaly without evidence of acute process. Electronically Signed   By: Richardean Sale M.D.   On: 12/07/2015 16:29    Time Spent in minutes  25   Lala Lund K M.D on 12/08/2015 at 10:13 AM  Between 7am to 7pm - Pager - [phone]  After 7pm go to [email or web address] - password [facility]  [facility] -  Office  [phone]

## 2015-12-08 NOTE — Progress Notes (Signed)
Patient transferred to 5W10 med surg bed, report called to [facility], RN.  Bernadene Garside, Tivis Ringer, RN

## 2015-12-08 NOTE — Progress Notes (Signed)
Routine EEG completed, results pending. 

## 2015-12-08 NOTE — Progress Notes (Signed)
Interval History:                                                                                                                      [name] [name] is an 55 y.o. male patient with confusion and right foot ulcer. Continues to have ome confusion and is not aware of the date. Able to follow simple commands.    Past Medical History: Past Medical History  Diagnosis Date  . Depression   . 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 Bipolar 1 disorder ([place])   . Hypertension   . Diabetes mellitus   . Seizures ([place])   . COPD (chronic obstructive pulmonary disease) ([place])   . Mental retardation   . Blind right eye   . GERD (gastroesophageal reflux disease)   . Chronic back pain   . Hepatic encephalopathy ([place])   . Anemia   . Thrombocytopenia ([place])   . Cardiomegaly   . Anxiety   . Arthritis   . Cholelithiasis     Asymptomatic  . Cerebrovascular disease     Right vertebral artery  . Hard of hearing   . Hyperlipidemia   . Cataract     Right eye  . Blood clots in brain     per patient  . Cirrhosis ([place])     diagnosed 07/2010 when presented with first episode of hepatic encephalopathy, afp on 416/13=4.8,  U/S on 12/14/11  liver stable, pt has received 2 hep A/B vaccines  . Diverticulosis     Past Surgical History  Procedure Laterality Date  . Tonsillectomy    . Ear operations    . Cast application  02/26/7208    Procedure: MINOR CAST APPLICATION;  Surgeon: [name], MD;  Location: [facility];  Service: Orthopedics;  Laterality: Right;  no anesthesia , procedure room do not need or room !  . Orif ankle fracture  08/27/2011    Procedure: OPEN REDUCTION INTERNAL FIXATION (ORIF) ANKLE FRACTURE;  Surgeon: [name], MD;  Location: [facility];  Service: Orthopedics;  Laterality: Right;  . Esophagogastroduodenoscopy  02/02/2011    Rourk-Normal esophagus.  No varices/Question mild portal gastropathy, extrinsic compression on the antrum, lesser curvature of uncertain significance, some  minimally  nodular mucosa status post biopsy, patent pylorus, normal duodenum 1 and duodenum 2.  . Colonoscopy  12/15/11    colonic divericulosis;poor prep, ACBE  recommended but has not been done  . Esophagogastroduodenoscopy  12/15/11    Rourk-->mild changes of portal gastropathy, gastric/duodenal erosions s/p bx  (reactive changes with mild chronic inflammation. No H.pylori  . Foot surgery      Family History: Family History  Problem Relation Age of Onset  . Heart failure Mother   . Thyroid disease Mother   . Cancer Father   . Asthma Brother   . Heart attack Brother   . Cirrhosis Brother     EtOH  . Colon cancer Maternal Aunt     Social History:   reports  that he has been smoking Cigarettes.  He has a 30 pack-year smoking history. He does not have any smokeless tobacco history on file. He reports that he does not drink alcohol or use illicit drugs.  Allergies:  Allergies  Allergen Reactions  . Penicillins Rash     Medications:                                                                                                                         Current facility-administered medications:  .  ALPRAZolam (XANAX) tablet 0.5 mg, 0.5 mg, Oral, QHS, Thurnell Lose, MD .  aspirin suppository 150 mg, 150 mg, Rectal, Daily, Thurnell Lose, MD, 150 mg at 12/07/15 2132 .  cefTRIAXone (ROCEPHIN) 1 g in dextrose 5 % 50 mL IVPB, 1 g, Intravenous, Q24H, Thurnell Lose, MD, Stopped at 12/07/15 2136 .  heparin injection 5,000 Units, 5,000 Units, Subcutaneous, 3 times per day, Thurnell Lose, MD, 5,000 Units at 12/08/15 [phone] .  insulin aspart (novoLOG) injection 0-9 Units, 0-9 Units, Subcutaneous, Q4H, Thurnell Lose, MD, 0 Units at 12/07/15 2126 .  lactulose (CHRONULAC) 10 GM/15ML solution 30 g, 30 g, Oral, Q6H, Thurnell Lose, MD, 30 g at 12/08/15 0622 .  levETIRAcetam (KEPPRA) 500 mg in sodium chloride 0.9 % 100 mL IVPB, 500 mg, Intravenous, Q12H, Carmin Muskrat, MD, Last Rate:  420 mL/hr at 12/08/15 0622, 500 mg at 12/08/15 0622 .  ondansetron (ZOFRAN) tablet 4 mg, 4 mg, Oral, Q6H PRN **OR** ondansetron (ZOFRAN) injection 4 mg, 4 mg, Intravenous, Q6H PRN, Thurnell Lose, MD .  pantoprazole (PROTONIX) EC tablet 40 mg, 40 mg, Oral, Daily, Thurnell Lose, MD, 40 mg at 12/07/15 2131 .  rifaximin (XIFAXAN) tablet 550 mg, 550 mg, Oral, 3 times per day, Thurnell Lose, MD, 550 mg at 12/08/15 0622 .  sodium chloride flush (NS) 0.9 % injection 3 mL, 3 mL, Intravenous, Q12H, Thurnell Lose, MD, 3 mL at 12/07/15 2136 .  vancomycin (VANCOCIN) 1,250 mg in sodium chloride 0.9 % 250 mL IVPB, 1,250 mg, Intravenous, Q12H, Thurnell Lose, MD, Last Rate: 166.7 mL/hr at 12/08/15 0255, 1,250 mg at 12/08/15 0255   Neurologic Examination:                                                                                                     Today's Vitals   12/08/15 0245 12/08/15 0315 12/08/15 0330 12/08/15 0841  BP: 122/69 157/86 157/77 129/78  Pulse: 96 80 86 93  Temp:   98.3 F (36.8 C) 98.4  F (36.9 C)  TempSrc:   Oral Oral  Resp:   12 19  Height:   '5\' 7"'$  (1.702 m)   Weight:   107.2 kg (236 lb 5.3 oz)   SpO2: 95% 92% 98% 100%  PainSc:        Evaluation of higher integrative functions including: Level of alertness: Alert, oriented but not to date.  Speech: positive dysarthria but no aphasia noted.  Test the following cranial nerves: right eye had sclerotic changes and could not visualize pupil. Left eye pupil and EOM intact.  Motor examination: Normal tone, bulk, full 5/5 motor strength in all 4 extremities Examination of sensation : Normal and symmetric sensation to pinprick in all 4 extremities and on face Examination of deep tendon reflexes: 2+, normal and symmetric in all extremities, normal plantars bilaterally Test coordination: Normal finger nose testing, Gait: Deferred   Lab Results: Basic Metabolic Panel:  Recent Labs Lab 12/07/15 1404 12/07/15 2347  12/08/15 0419  NA 142  --  142  K 4.2  --  3.5  CL 109  --  110  CO2 23  --  24  GLUCOSE 112*  --  93  BUN 5*  --  <5*  CREATININE 0.70 0.76 0.75  CALCIUM 8.9  --  8.4*    Liver Function Tests:  Recent Labs Lab 12/07/15 1404 12/08/15 0419  AST 62* 68*  ALT 20 20  ALKPHOS 103 91  BILITOT 1.8* 2.2*  PROT 5.9* 5.3*  ALBUMIN 2.8* 2.6*   No results for input(s): LIPASE, AMYLASE in the last 168 hours.  Recent Labs Lab 12/07/15 1404 12/08/15 0419  AMMONIA 81* 111*    CBC:  Recent Labs Lab 12/07/15 1404 12/07/15 2347  WBC 2.8* 3.2*  HGB 9.7* 8.7*  HCT 30.9* 28.6*  MCV 89.0 88.8  PLT 51* 51*    Cardiac Enzymes:  Recent Labs Lab 12/07/15 1404  CKTOTAL 1027*    Lipid Panel: No results for input(s): CHOL, TRIG, HDL, CHOLHDL, VLDL, LDLCALC in the last 168 hours.  CBG:  Recent Labs Lab 12/07/15 1424 12/07/15 2122 12/08/15 0126 12/08/15 0431 12/08/15 0840  GLUCAP 84 88 79 89 92    Microbiology: Results for orders placed or performed during the hospital encounter of 12/07/15  MRSA PCR Screening     Status: None   Collection Time: 12/08/15  3:52 AM  Result Value Ref Range Status   MRSA by PCR NEGATIVE NEGATIVE Final    Comment:        The GeneXpert MRSA Assay (FDA approved for NASAL specimens only), is one component of a comprehensive MRSA colonization surveillance program. It is not intended to diagnose MRSA infection nor to guide or monitor treatment for MRSA infections.     Imaging: Ct Head Wo Contrast  12/07/2015  CLINICAL DATA:  Altered mental status, found unresponsive on bathroom floor this morning EXAM: CT HEAD WITHOUT CONTRAST TECHNIQUE: Contiguous axial images were obtained from the base of the skull through the vertex without intravenous contrast. COMPARISON:  10/23/2013 FINDINGS: The bony calvarium is intact. No acute soft tissue abnormality is noted. Chronic calcification of the right globe is seen. Changes of prior mastoidectomy  are noted bilaterally and stable. No findings to suggest acute hemorrhage, acute infarction or space-occupying mass lesion are noted. IMPRESSION: Chronic changes without acute abnormality. Electronically Signed   By: Inez Catalina M.D.   On: 12/07/2015 16:36   Dg Chest Portable 1 View  12/07/2015  CLINICAL DATA:  Altered mental  status.  Unwitnessed fall. EXAM: PORTABLE CHEST 1 VIEW COMPARISON:  11/23/2015 and 01/03/2014. FINDINGS: 1602 hours. There is stable cardiomegaly. The mediastinal contours are unchanged. The lungs appear clear. There is no pleural effusion or pneumothorax. No acute osseous findings are seen. IMPRESSION: Stable cardiomegaly without evidence of acute process. Electronically Signed   By: Richardean Sale M.D.   On: 12/07/2015 16:29    Assessment and plan:   [name] [name] is an 55 y.o. male patient with AMS in setting of multiple factors, including hepatic encephalopathy with elevated ammonia level, as well as multiple cellulitis involving the right foot and possible sepsis, and addition to baseline mental retardation. Awaiting MRI and EEG.   Will be seen by Dr. Silverio Decamp. Please see his attestation note for A/P for any additional work up recommendations.

## 2015-12-08 NOTE — Procedures (Signed)
TECHNICAL SUMMARY:  A multichannel referential and bipolar montage EEG using the standard international 10-20 system was performed on the patient described as drowsy.  There is no occipital dominant rhythm.  When most awake, there is 5-1/2-6 Hz activity noted throughout all head regions.  ACTIVATION:  Stepwise photic stimulation at 4-20 flashes per second was performed and did not elicit any abnormal waveforms.  Hyperventilation was not performed  EPILEPTIFORM ACTIVITY:  There were no spikes, sharp waves or paroxysmal activity.  SLEEP:  Stage I and stage II sleep architecture were identified  CARDIAC:  The EKG lead revealed a regular sinus rhythm.  IMPRESSION:  This is an abnormal EEG demonstrating a moderate diffuse slowing of electrocerebral activity.  This can be seen in a wide variety of encephalopathic state including those of a toxic, metabolic, or degenerative nature.  There were no focal, hemispheric, or lateralizing features.  No epileptiform activity was recorded.

## 2015-12-08 NOTE — Evaluation (Signed)
Clinical/Bedside Swallow Evaluation Patient Details  Name: [name] [name] MRN: [id number] Date of Birth: [birth year]  Today's Date: 12/08/2015 Time: SLP Start Time (ACUTE ONLY): 0950 SLP Stop Time (ACUTE ONLY): 1000 SLP Time Calculation (min) (ACUTE ONLY): 10 min  Past Medical History:  Past Medical History  Diagnosis Date  . Depression   . 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 Bipolar 1 disorder ([place])   . Hypertension   . Diabetes mellitus   . Seizures ([place])   . COPD (chronic obstructive pulmonary disease) ([place])   . Mental retardation   . Blind right eye   . GERD (gastroesophageal reflux disease)   . Chronic back pain   . Hepatic encephalopathy ([place])   . Anemia   . Thrombocytopenia ([place])   . Cardiomegaly   . Anxiety   . Arthritis   . Cholelithiasis     Asymptomatic  . Cerebrovascular disease     Right vertebral artery  . Hard of hearing   . Hyperlipidemia   . Cataract     Right eye  . Blood clots in brain     per patient  . Cirrhosis ([place])     diagnosed 07/2010 when presented with first episode of hepatic encephalopathy, afp on 416/13=4.8,  U/S on 12/14/11  liver stable, pt has received 2 hep A/B vaccines  . Diverticulosis    Past Surgical History:  Past Surgical History  Procedure Laterality Date  . Tonsillectomy    . Ear operations    . Cast application  11/27/8293    Procedure: MINOR CAST APPLICATION;  Surgeon: [name], MD;  Location: [facility];  Service: Orthopedics;  Laterality: Right;  no anesthesia , procedure room do not need or room !  . Orif ankle fracture  08/27/2011    Procedure: OPEN REDUCTION INTERNAL FIXATION (ORIF) ANKLE FRACTURE;  Surgeon: [name], MD;  Location: [facility];  Service: Orthopedics;  Laterality: Right;  . Esophagogastroduodenoscopy  02/02/2011    Rourk-Normal esophagus.  No varices/Question mild portal gastropathy, extrinsic compression on the antrum, lesser curvature of uncertain significance, some minimally  nodular mucosa  status post biopsy, patent pylorus, normal duodenum 1 and duodenum 2.  . Colonoscopy  12/15/11    colonic divericulosis;poor prep, ACBE  recommended but has not been done  . Esophagogastroduodenoscopy  12/15/11    Rourk-->mild changes of portal gastropathy, gastric/duodenal erosions s/p bx  (reactive changes with mild chronic inflammation. No H.pylori  . Foot surgery     HPI:  55 year old man with multiple medical problems as indicated above, including liver cirrhosis and history of hepatic encephalopathy, presenting with altered mental status. Etiology is unclear, but likely secondary to multiple factors, including hepatic encephalopathy with elevated ammonia level, as well as multiple cellulitis involving the right foot and possible sepsis, and addition to baseline mental retardation.   Assessment / Plan / Recommendation Clinical Impression  Pt demonstrates swallow function with impairment. No signs of aspiration. Pt is alert and feeding himself with min assist. Recommend a regular diet and thin liquids.     Aspiration Risk  Mild aspiration risk    Diet Recommendation Regular;Thin liquid   Medication Administration: Whole meds with liquid Supervision: Patient able to self feed       Follow up Recommendations    none     Swallow Study   General HPI: 55 year old man with multiple medical problems as indicated above, including liver cirrhosis and history of hepatic encephalopathy, presenting with altered mental status. Etiology  is unclear, but likely secondary to multiple factors, including hepatic encephalopathy with elevated ammonia level, as well as multiple cellulitis involving the right foot and possible sepsis, and addition to baseline mental retardation. Type of Study: Bedside Swallow Evaluation Previous Swallow Assessment: no Diet Prior to this Study: NPO Temperature Spikes Noted: No Respiratory Status: Room air History of Recent Intubation: No Behavior/Cognition:  Alert;Cooperative;Pleasant mood Oral Cavity Assessment: Within Functional Limits Oral Care Completed by SLP: No Oral Cavity - Dentition: Edentulous Self-Feeding Abilities: Able to feed self Patient Positioning: Upright in bed Baseline Vocal Quality: Normal Volitional Cough: Strong    Oral/Motor/Sensory Function Overall Oral Motor/Sensory Function: Within functional limits   Ice Chips     Thin Liquid Thin Liquid: Within functional limits    Nectar Thick Nectar Thick Liquid: Not tested   Honey Thick Honey Thick Liquid: Not tested   Puree Puree: Within functional limits   Solid   GO   Solid: Within functional limits        [name] [name], [name] [name] 12/08/2015,10:08 AM

## 2015-12-09 LAB — COMPREHENSIVE METABOLIC PANEL
ALT: 22 U/L (ref 17–63)
ANION GAP: 9 (ref 5–15)
AST: 82 U/L — ABNORMAL HIGH (ref 15–41)
Albumin: 2.6 g/dL — ABNORMAL LOW (ref 3.5–5.0)
Alkaline Phosphatase: 92 U/L (ref 38–126)
BILIRUBIN TOTAL: 1.8 mg/dL — AB (ref 0.3–1.2)
BUN: <5 mg/dL — ABNORMAL LOW (ref 6–20)
CO2: 23 mmol/L (ref 22–32)
Calcium: 8.6 mg/dL — ABNORMAL LOW (ref 8.9–10.3)
Chloride: 109 mmol/L (ref 101–111)
Creatinine, Ser: 0.73 mg/dL (ref 0.61–1.24)
GFR calc Af Amer: >60 mL/min (ref >60)
Glucose, Bld: 105 mg/dL — ABNORMAL HIGH (ref 65–99)
POTASSIUM: 3.8 mmol/L (ref 3.5–5.1)
Sodium: 141 mmol/L (ref 135–145)
TOTAL PROTEIN: 5.4 g/dL — AB (ref 6.5–8.1)

## 2015-12-09 LAB — CBC
HEMATOCRIT: 28.8 % — AB (ref 39.0–52.0)
HEMOGLOBIN: 8.8 g/dL — AB (ref 13.0–17.0)
MCH: 27.1 pg (ref 26.0–34.0)
MCHC: 30.6 g/dL (ref 30.0–36.0)
MCV: 88.6 fL (ref 78.0–100.0)
Platelets: 45 10*3/uL — ABNORMAL LOW (ref 150–400)
RBC: 3.25 MIL/uL — ABNORMAL LOW (ref 4.22–5.81)
RDW: 17.7 % — ABNORMAL HIGH (ref 11.5–15.5)
WBC: 1.8 10*3/uL — AB (ref 4.0–10.5)

## 2015-12-09 LAB — GLUCOSE, CAPILLARY
GLUCOSE-CAPILLARY: 117 mg/dL — AB (ref 65–99)
GLUCOSE-CAPILLARY: 106 mg/dL — AB (ref 65–99)
GLUCOSE-CAPILLARY: 146 mg/dL — AB (ref 65–99)
Glucose-Capillary: 108 mg/dL — ABNORMAL HIGH (ref 65–99)

## 2015-12-09 LAB — HEMOGLOBIN A1C
HEMOGLOBIN A1C: 5.9 % — AB (ref 4.8–5.6)
MEAN PLASMA GLUCOSE: 123 mg/dL

## 2015-12-09 MED ORDER — DOXYCYCLINE HYCLATE 100 MG PO TABS
100.0000 mg | ORAL_TABLET | Freq: Two times a day (BID) | ORAL | Status: DC
  Administered 2015-12-09 – 2015-12-10 (×3): 100 mg via ORAL
  Filled 2015-12-09 (×3): qty 1

## 2015-12-09 MED ORDER — RIFAXIMIN 550 MG PO TABS: 550.0000 mg | ORAL_TABLET | Freq: Two times a day (BID) | ORAL | Status: DC

## 2015-12-09 MED ORDER — RIFAXIMIN 200 MG PO TABS
400.0000 mg | ORAL_TABLET | Freq: Three times a day (TID) | ORAL | Status: DC
  Administered 2015-12-09 – 2015-12-10 (×3): 400 mg via ORAL
  Filled 2015-12-09 (×4): qty 2

## 2015-12-09 MED ORDER — METOPROLOL TARTRATE 50 MG PO TABS
50.0000 mg | ORAL_TABLET | Freq: Two times a day (BID) | ORAL | Status: DC
  Administered 2015-12-09 – 2015-12-10 (×3): 50 mg via ORAL
  Filled 2015-12-09 (×3): qty 1

## 2015-12-09 MED ORDER — FUROSEMIDE 20 MG PO TABS
20.0000 mg | ORAL_TABLET | Freq: Every day | ORAL | Status: DC
Start: 2015-12-09 — End: 2015-12-10
  Administered 2015-12-09 – 2015-12-10 (×2): 20 mg via ORAL
  Filled 2015-12-09 (×2): qty 1

## 2015-12-09 MED ORDER — INSULIN ASPART 100 UNIT/ML ~~LOC~~ SOLN
0.0000 [IU] | Freq: Three times a day (TID) | SUBCUTANEOUS | Status: DC
  Administered 2015-12-10: 1 [IU] via SUBCUTANEOUS

## 2015-12-09 NOTE — Progress Notes (Addendum)
Spoke with Brainard at [facility], no other information is needed, 7 day voucher will be sent to [facility]. Patient Assistance form on front of chart with note that it is to go home with patinet at DC and will need completed (will personal financial information CM could not provide) and faxed in ASAP. Form also faxed to Dr Glennon Mac at [facility] in [place] to keep in the patient chart in case patient does not complete or loses the one provided at discharge. Spoke with Otila Kluver in Dr Marisue [place] office and explained this to her, she stated she would keep a look out for the fax and make sure MD gets it. Left VM with wife explaining form and notifying her that a copy is also available at MD's office, and to call CM back with questions.  Addendum 12-10-15 CM instructed Kayla RN to take form off of chart and include It in DC packet

## 2015-12-09 NOTE — Progress Notes (Signed)
PROGRESS NOTE                                                                                                                                                                                                             Patient Demographics:    [name] [name], is a 55 y.o. male, DOB - May 03, 1961, FUX:[id number]  Admit date - 12/07/2015   Admitting Physician Thurnell Lose, MD  Outpatient Primary MD for the patient is Carolee Rota, NP  LOS - 2  Outpatient Specialists: GI group at [facility] Complaint  Patient presents with  . Altered Mental Status  . Fall       Brief Narrative     Subjective:    [name] today has, No headache, No chest pain, No abdominal pain - No Nausea, No new weakness tingling or numbness, No Cough - SOB.     Assessment  & Plan :     1. Encephalopathy. Most likely hepatic, although underlying seizures cannot be ruled out, he is much improved despite his ammonia levels being further elevated than on the day of admission, question if it was underlying seizure which has improved with Keppra which was given IV loading dose in the ER.  Continue lactulose and Xifaxan, continue Keppra, CT head nonacute, neurology following. MRI and EEG non acute. Mentation much better than on the day of admission & likely close to baseline. We will increase activity, PT eval, cleared by speech for oral diet.  2. History of seizures. Noncompliant with Keppra. He has been counseled on compliance,-IV Keppra now been oral.  3. NASH with cirrhosis and pancytopenia. No signs of bleeding or SBP, belly soft, he is afebrile, supportive care, for now Rocephin and monitor. P CBC in the morning.  4. Chronic right foot ulcer. Question minimal cellulitis if any, blood cultures drawn, was on empiric vancomycin and Rocephin. Will taper down to Doxy. His ulcer is chronic for the last several years and unchanged. Outpatient  follow-up.  5. COPD. At baseline. No wheezing. Supportive care.  6. Chronic pain and anxiety. Hold back narcotics and benzodiazepine, will start Ativan 0.5 daily at bedtime from tomorrow to avoid abrupt withdrawal.  7. Marijuana use. Counseled to quit, he says his wife provided him with marijuana on the day of admission.  8. GERD. Continue PPI.  9. DM type  II. Check A1c, every 4 hours sliding scale.  CBG (last 3)   Recent Labs  12/08/15 2123 12/09/15 0806 12/09/15 1158  GLUCAP 89 108* 117*     Code Status : DO NOT RESUSCITATE  Family Communication  : Wife on the day of admission  Disposition Plan  : Likely discharge in 1-2 days if stable  Barriers For Discharge : Encephalopathy  Consults  : Neuro  Procedures  :   CT head nonacute  MRI brain  EEG  DVT Prophylaxis  :   Heparin    Lab Results  Component Value Date   PLT 45* 12/09/2015    Antibiotics  :     Anti-infectives    Start     Dose/Rate Route Frequency Ordered Stop   12/13/15 1000  rifaximin (XIFAXAN) tablet 550 mg     550 mg Oral 2 times daily 12/09/15 1049     12/09/15 1400  rifaximin (XIFAXAN) tablet 400 mg     400 mg Oral Every 8 hours 12/09/15 1049 12/13/15 0559   12/08/15 0200  vancomycin (VANCOCIN) 1,250 mg in sodium chloride 0.9 % 250 mL IVPB     1,250 mg 166.7 mL/hr over 90 Minutes Intravenous Every 12 hours 12/07/15 1805     12/07/15 1800  rifaximin (XIFAXAN) tablet 550 mg  Status:  Discontinued     550 mg Oral 3 times per day 12/07/15 1751 12/09/15 1049   12/07/15 1800  cefTRIAXone (ROCEPHIN) 1 g in dextrose 5 % 50 mL IVPB     1 g 100 mL/hr over 30 Minutes Intravenous Every 24 hours 12/07/15 1751     12/07/15 1500  piperacillin-tazobactam (ZOSYN) IVPB 3.375 g     3.375 g 12.5 mL/hr over 240 Minutes Intravenous  Once 12/07/15 1449 12/07/15 1851   12/07/15 1500  vancomycin (VANCOCIN) IVPB 1000 mg/200 mL premix     1,000 mg 200 mL/hr over 60 Minutes Intravenous  Once 12/07/15 1449  12/07/15 1700        Objective:   Filed Vitals:   12/08/15 1502 12/08/15 1800 12/08/15 2125 12/09/15 0442  BP: 172/80 150/80 166/75 161/78  Pulse: 92  86 84  Temp: 98.4 F (36.9 C)  98 F (36.7 C) 98 F (36.7 C)  TempSrc: Oral     Resp: 20  16 16   Height:      Weight:      SpO2: 100%  100% 99%    Wt Readings from Last 3 Encounters:  12/08/15 107.2 kg (236 lb 5.3 oz)  02/14/14 72.576 kg (160 lb)  10/24/13 88.5 kg (195 lb 1.7 oz)     Intake/Output Summary (Last 24 hours) at 12/09/15 1308 Last data filed at 12/09/15 0500  Gross per 24 hour  Intake    600 ml  Output   1000 ml  Net   -400 ml     Physical Exam  Awake Alert, Oriented X 2, No new F.N deficits, Normal affect [place].AT,PERRAL Supple Neck,No JVD, No cervical lymphadenopathy appriciated.  Symmetrical Chest wall movement, Good air movement bilaterally, CTAB RRR,No Gallops,Rubs or new Murmurs, No Parasternal Heave +ve B.Sounds, Abd Soft, No tenderness, No organomegaly appriciated, No rebound - guarding or rigidity. No Cyanosis, Clubbing or edema, No new Rash or bruise       Data Review:    CBC  Recent Labs Lab 12/07/15 1404 12/07/15 2347 12/09/15 0830  WBC 2.8* 3.2* 1.8*  HGB 9.7* 8.7* 8.8*  HCT 30.9* 28.6* 28.8*  PLT 51*  51* 45*  MCV 89.0 88.8 88.6  MCH 28.0 27.0 27.1  MCHC 31.4 30.4 30.6  RDW 17.7* 17.8* 17.7*    Chemistries   Recent Labs Lab 12/07/15 1404 12/07/15 2347 12/08/15 0419 12/09/15 0830  NA 142  --  142 141  K 4.2  --  3.5 3.8  CL 109  --  110 109  CO2 23  --  24 23  GLUCOSE 112*  --  93 105*  BUN 5*  --  <5* <5*  CREATININE 0.70 0.76 0.75 0.73  CALCIUM 8.9  --  8.4* 8.6*  AST 62*  --  68* 82*  ALT 20  --  20 22  ALKPHOS 103  --  91 92  BILITOT 1.8*  --  2.2* 1.8*   ------------------------------------------------------------------------------------------------------------------ No results for input(s): CHOL, HDL, LDLCALC, TRIG, CHOLHDL, LDLDIRECT in the last 72  hours.  Lab Results  Component Value Date   HGBA1C 5.9* 12/07/2015   ------------------------------------------------------------------------------------------------------------------  Recent Labs  12/07/15 2347  TSH 1.995   ------------------------------------------------------------------------------------------------------------------ No results for input(s): VITAMINB12, FOLATE, FERRITIN, TIBC, IRON, RETICCTPCT in the last 72 hours.  Coagulation profile  Recent Labs Lab 12/07/15 2347  INR 1.54*    No results for input(s): DDIMER in the last 72 hours.  Cardiac Enzymes No results for input(s): CKMB, TROPONINI, MYOGLOBIN in the last 168 hours.  Invalid input(s): CK ------------------------------------------------------------------------------------------------------------------ No results found for: BNP  Inpatient Medications  Scheduled Meds: . ALPRAZolam  0.5 mg Oral QHS  . aspirin  81 mg Oral Daily  . cefTRIAXone (ROCEPHIN)  IV  1 g Intravenous Q24H  . furosemide  20 mg Oral Daily  . heparin  5,000 Units Subcutaneous 3 times per day  . insulin aspart  0-5 Units Subcutaneous QHS  . insulin aspart  0-9 Units Subcutaneous TID WC  . lactulose  30 g Oral Q6H  . levETIRAcetam  500 mg Oral 2 times per day  . metoprolol tartrate  50 mg Oral BID  . pantoprazole  40 mg Oral Daily  . rifaximin  400 mg Oral Q8H   Followed by  . [START ON 12/13/2015] rifaximin  550 mg Oral BID  . sodium chloride flush  3 mL Intravenous Q12H  . vancomycin  1,250 mg Intravenous Q12H   Continuous Infusions:  PRN Meds:.[DISCONTINUED] ondansetron **OR** ondansetron (ZOFRAN) IV  Micro Results Recent Results (from the past 240 hour(s))  Culture, blood (Routine X 2) w Reflex to ID Panel     Status: None (Preliminary result)   Collection Time: 12/07/15  2:04 PM  Result Value Ref Range Status   Specimen Description BLOOD RIGHT ARM  Final   Special Requests BOTTLES DRAWN AEROBIC AND ANAEROBIC  5CC  Final   Culture NO GROWTH 2 DAYS  Final   Report Status PENDING  Incomplete  Culture, blood (Routine X 2) w Reflex to ID Panel     Status: None (Preliminary result)   Collection Time: 12/07/15  3:12 PM  Result Value Ref Range Status   Specimen Description BLOOD LEFT ANTECUBITAL  Final   Special Requests BOTTLES DRAWN AEROBIC AND ANAEROBIC 10CC  Final   Culture NO GROWTH 2 DAYS  Final   Report Status PENDING  Incomplete  MRSA PCR Screening     Status: None   Collection Time: 12/08/15  3:52 AM  Result Value Ref Range Status   MRSA by PCR NEGATIVE NEGATIVE Final    Comment:        The GeneXpert MRSA  Assay (FDA approved for NASAL specimens only), is one component of a comprehensive MRSA colonization surveillance program. It is not intended to diagnose MRSA infection nor to guide or monitor treatment for MRSA infections.   Culture, blood (routine x 2)     Status: None (Preliminary result)   Collection Time: 12/08/15  4:19 AM  Result Value Ref Range Status   Specimen Description BLOOD LEFT HAND  Final   Special Requests IN PEDIATRIC BOTTLE  2CC  Final   Culture NO GROWTH 1 DAY  Final   Report Status PENDING  Incomplete  Culture, blood (routine x 2)     Status: None (Preliminary result)   Collection Time: 12/08/15  4:24 AM  Result Value Ref Range Status   Specimen Description BLOOD RIGHT HAND  Final   Special Requests IN PEDIATRIC BOTTLE  3CC  Final   Culture NO GROWTH 1 DAY  Final   Report Status PENDING  Incomplete    Radiology Reports Dg Chest 2 View  11/23/2015  CLINICAL DATA:  Altered mental status, confusion EXAM: CHEST  2 VIEW COMPARISON:  01/03/2014 FINDINGS: Rotated exam to the right. Cardiomegaly evident without CHF or definite pneumonia. No collapse or consolidation. Negative for edema or pneumothorax. Monitor leads overlie the chest. No acute osseous finding. IMPRESSION: Rotated exam. Mild cardiomegaly without acute process. No significant interval change.  Electronically Signed   By: Jerilynn Mages.  Shick M.D.   On: 11/23/2015 14:04   Ct Head Wo Contrast  12/07/2015  CLINICAL DATA:  Altered mental status, found unresponsive on bathroom floor this morning EXAM: CT HEAD WITHOUT CONTRAST TECHNIQUE: Contiguous axial images were obtained from the base of the skull through the vertex without intravenous contrast. COMPARISON:  10/23/2013 FINDINGS: The bony calvarium is intact. No acute soft tissue abnormality is noted. Chronic calcification of the right globe is seen. Changes of prior mastoidectomy are noted bilaterally and stable. No findings to suggest acute hemorrhage, acute infarction or space-occupying mass lesion are noted. IMPRESSION: Chronic changes without acute abnormality. Electronically Signed   By: Inez Catalina M.D.   On: 12/07/2015 16:36   Mr Brain Wo Contrast  12/08/2015  CLINICAL DATA:  Altered mental status. Fall today. Cirrhosis, hypertension, diabetes, hyperlipidemia. Seizure disorder. EXAM: MRI HEAD WITHOUT CONTRAST TECHNIQUE: Multiplanar, multiecho pulse sequences of the brain and surrounding structures were obtained without intravenous contrast. COMPARISON:  CT 12/07/2015.  MRI 01/29/2011 FINDINGS: Ventricle size normal.  Cerebral volume normal. Negative for acute infarct. Scattered small white matter hyperintensities in the deep white matter bilateral have developed since the prior MRI. This most likely related to chronic microvascular ischemia. No cortical infarct. T1 hyperintensity in the basal ganglia bilaterally compatible with cirrhosis of liver. This is unchanged. Negative for intracranial hemorrhage.  Negative for mass or edema. Brainstem and cerebellum normal. Chronic injury to the right globe likely retinal hemorrhage. Left globe appears normal. Mild mucosal edema in the paranasal sinuses. Normal pituitary. Normal skullbase. Image quality degraded by mild motion. IMPRESSION: No acute abnormality Mild chronic white matter changes have progressed  since 2012 and likely due to microvascular ischemia Electronically Signed   By: Franchot Gallo M.D.   On: 12/08/2015 12:39   Dg Chest Portable 1 View  12/07/2015  CLINICAL DATA:  Altered mental status.  Unwitnessed fall. EXAM: PORTABLE CHEST 1 VIEW COMPARISON:  11/23/2015 and 01/03/2014. FINDINGS: 1602 hours. There is stable cardiomegaly. The mediastinal contours are unchanged. The lungs appear clear. There is no pleural effusion or pneumothorax. No acute osseous findings  are seen. IMPRESSION: Stable cardiomegaly without evidence of acute process. Electronically Signed   By: Richardean Sale M.D.   On: 12/07/2015 16:29    Time Spent in minutes  25   SINGH,PRASHANT K M.D on 12/09/2015 at 1:08 PM  Between 7am to 7pm - Pager - [phone]  After 7pm go to [email or web address] - password [facility]  [facility] -  Office  [phone]

## 2015-12-09 NOTE — Progress Notes (Signed)
Physical Therapy Treatment Patient Details Name: [name] [name] MRN: [id number] DOB: [birth year] Today's Date: 12/09/2015    History of Present Illness Pt adm with encephalopathy most likely hepatic. PMH - seizures, chronic rt foot ulcer, DM, COPD, mental retardation, HTN, NASH cirrhosis, chronic back pain    PT Comments    Pt performed increased gait distance require motivation to participate in intervention.  Pt set to d/c home tomorrow.  Pt unsteady and required assist and tactile guidance to avoid obstacles due to low vision.    Follow Up Recommendations  No PT follow up;Supervision - Intermittent     Equipment Recommendations  None recommended by PT    Recommendations for Other Services       Precautions / Restrictions Precautions Precautions: Fall Restrictions Weight Bearing Restrictions: No    Mobility  Bed Mobility Overal bed mobility: Needs Assistance Bed Mobility: Supine to Sit     Supine to sit: Supervision;HOB elevated     General bed mobility comments: Incr time  Transfers Overall transfer level: Needs assistance Equipment used: Rolling walker (2 wheeled) Transfers: Sit to/from Stand Sit to Stand: Min assist         General transfer comment: Relies heavily on leaning to back of bed for support,  poor weight shifting forward.  Pt demonstrates poor hand placement.    Ambulation/Gait Ambulation/Gait assistance: Min guard;Min assist Ambulation Distance (Feet): 220 Feet Assistive device: Rolling walker (2 wheeled) Gait Pattern/deviations: Staggering left;Decreased stride length;Step-through pattern;Antalgic Gait velocity: decr   General Gait Details: Assist for safety. Pt required cues to ambulate in center position of RW.  Pt required min assist to negotiate obstacles in halls.     Stairs            Wheelchair Mobility    Modified Rankin (Stroke Patients Only)       Balance Overall balance assessment: Needs  assistance Sitting-balance support: No upper extremity supported;Feet supported Sitting balance-Leahy Scale: Good       Standing balance-Leahy Scale: Fair                      Cognition Arousal/Alertness: Awake/alert Behavior During Therapy: WFL for tasks assessed/performed Overall Cognitive Status: No family/caregiver present to determine baseline cognitive functioning                      Exercises      General Comments        Pertinent Vitals/Pain Pain Assessment: 0-10 Pain Score: 5  Pain Location: reports pain in R foot.      Home Living                      Prior Function            PT Goals (current goals can now be found in the care plan section) Acute Rehab PT Goals Patient Stated Goal: Return home Potential to Achieve Goals: Good Progress towards PT goals: Progressing toward goals    Frequency  Min 3X/week    PT Plan      Co-evaluation             End of Session Equipment Utilized During Treatment: Gait belt Activity Tolerance: Patient tolerated treatment well Patient left: in chair;with call bell/phone within reach;with chair alarm set     Time: 1338-1400 PT Time Calculation (min) (ACUTE ONLY): 22 min  Charges:  $Gait Training: 8-22 mins  G Codes:      Cristela Blue 12/09/2015, 2:15 PM  Governor Rooks, PTA pager [phone]

## 2015-12-10 ENCOUNTER — Encounter (HOSPITAL_COMMUNITY): Payer: Self-pay | Admitting: General Practice

## 2015-12-10 LAB — GLUCOSE, CAPILLARY
GLUCOSE-CAPILLARY: 111 mg/dL — AB (ref 65–99)
Glucose-Capillary: 123 mg/dL — ABNORMAL HIGH (ref 65–99)

## 2015-12-10 MED ORDER — ALPRAZOLAM 0.5 MG PO TABS: 0.5000 mg | ORAL_TABLET | Freq: Every evening | ORAL | Status: DC | PRN

## 2015-12-10 MED ORDER — RIFAXIMIN 550 MG PO TABS: 550.0000 mg | ORAL_TABLET | Freq: Two times a day (BID) | ORAL | Status: DC

## 2015-12-10 MED ORDER — RIFAXIMIN 200 MG PO TABS: 400.0000 mg | ORAL_TABLET | Freq: Three times a day (TID) | ORAL | Status: DC

## 2015-12-10 MED ORDER — DOXYCYCLINE HYCLATE 100 MG PO TABS: 100.0000 mg | ORAL_TABLET | Freq: Two times a day (BID) | ORAL | Status: DC

## 2015-12-10 NOTE — Progress Notes (Signed)
Shared contact numbers of wife and relative with Jamesetta So of X[employer]an at her request as his home number was not working. Copay will be $642 after approval from [facility]. She was contacting to assist patient with Patient Assistance program. Patient discharged, assistance forms given to wife and PCP prior to DC.

## 2015-12-10 NOTE — Discharge Summary (Signed)
[name] [name], is a 55 y.o. male  DOB 1961/03/08  MRN [id number].  Admission date:  12/07/2015  Admitting Physician  Thurnell Lose, MD  Discharge Date:  12/10/2015   Primary MD  Carolee Rota, NP  Recommendations for primary care physician for things to follow:   Monitor glycemic control, lower extremity cellulitis closely. Check CBC BMP in 2-3 days.  Minimize narcotic benzodiazepine use, monitor recreational drug use.   Admission Diagnosis  Altered mental status, unspecified altered mental status type [R41.82]   Discharge Diagnosis  Altered mental status, unspecified altered mental status type [R41.82]     Principal Problem:   Hepatic encephalopathy ([place]) Active Problems:   COPD (chronic obstructive pulmonary disease) (HCC)   Cirrhosis (HCC)   Seizure disorder (HCC)   Tobacco abuse   Acute hepatic encephalopathy (HCC)   Altered mental state      Past Medical History  Diagnosis Date  . Depression   . Cirrhosis ([place])     suspected NASH  . Bipolar 1 disorder ([place])   . Hypertension   . Diabetes mellitus   . Seizures ([place])   . COPD (chronic obstructive pulmonary disease) ([place])   . Mental retardation   . Blind right eye   . GERD (gastroesophageal reflux disease)   . Chronic back pain   . Hepatic encephalopathy ([place])   . Anemia   . Thrombocytopenia ([place])   . Cardiomegaly   . Anxiety   . Arthritis   . Cholelithiasis     Asymptomatic  . Cerebrovascular disease     Right vertebral artery  . Hard of hearing   . Hyperlipidemia   . Cataract     Right eye  . Blood clots in brain     per patient  . Cirrhosis ([place])     diagnosed 07/2010 when presented with first episode of hepatic encephalopathy, afp on 416/13=4.8,  U/S on 12/14/11  liver stable, pt has received 2 hep A/B vaccines  .  Diverticulosis     Past Surgical History  Procedure Laterality Date  . Tonsillectomy    . Ear operations    . Cast application  08/26/4313    Procedure: MINOR CAST APPLICATION;  Surgeon: [name], MD;  Location: [facility];  Service: Orthopedics;  Laterality: Right;  no anesthesia , procedure room do not need or room !  . Orif ankle fracture  08/27/2011    Procedure: OPEN REDUCTION INTERNAL FIXATION (ORIF) ANKLE FRACTURE;  Surgeon: [name], MD;  Location: [facility];  Service: Orthopedics;  Laterality: Right;  . Esophagogastroduodenoscopy  02/02/2011    Rourk-Normal esophagus.  No varices/Question mild portal gastropathy, extrinsic compression on the antrum, lesser curvature of uncertain significance, some minimally  nodular mucosa status post biopsy, patent pylorus, normal duodenum 1 and duodenum 2.  . Colonoscopy  12/15/11    colonic divericulosis;poor prep, ACBE  recommended but has not been done  . Esophagogastroduodenoscopy  12/15/11    Rourk-->mild changes of portal gastropathy, gastric/duodenal erosions s/p bx  (reactive changes with  mild chronic inflammation. No H.pylori  . Foot surgery         HPI  from the history and physical done on the day of admission:    [name] [name] is a 55 y.o. male, History of [name] [name], cirrhosis with pancytopenia, chronic diastolic heart failure last EF 50%, seizure is not compliant with his Keppra, hepatic encephalopathy on lactulose, diabetes mellitus type 2 not on insulin, diverticulosis, cataract in the right eye, chronic back pain and anxiety on a cortex and benzodiazepines, decreased drug use, COPD, mental retarded addition, hypertension, depression who has had several episodes of hepatic encephalopathy in the past brought in by his wife for gradually progressive confusion and somnolence or the last week or so, he had a mechanical fall in the bathroom this morning and was able to get up and was on the floor for several hours, thereafter called 911  and brought him to the ER.   In the ER was found to be encephalopathic and somnolent, initial blood work showed elevated ammonia, pancytopenia, head CT was unremarkable, new to was consulted and I was called to admit the patient. Patient except for      Hospital Course:    1. Encephalopathy. Most likely hepatic, although underlying seizures cannot be ruled out, he is much improved despite his ammonia levels being further elevated than on the day of admission, question if it was underlying seizure which has improved with Keppra which was given IV loading dose in the ER.  Continue lactulose and Xifaxan, continue Keppra, CT head, EEG & MRI Brain nonacute, was seen by neurology, now close to baseline, cleared by speech and PT for home discharge. Today he is eager to be discharged home, will be placed on Xifaxan, continue home dose lactulose and Keppra, counseled on compliance.    2. History of seizures. Noncompliant with Keppra. He has been counseled on compliance, bloated with IV Keppra while he was here in the ER, now switched to oral Keppra will be discharged on home dose.  3. NASH with pancytopenia. No signs of bleeding or SBP, belly soft, he is afebrile, supportive care, PCP to monitor CBC and cirrhosis.  4. Chronic right foot ulcer. Question minimal cellulitis if any, blood cultures drawn, was on empiric vancomycin and Rocephin. Will taper down to Doxy for 6 more days. His ulcer is chronic for the last several years and unchanged. Outpatient follow-up.  5. COPD. At baseline. No wheezing. Supportive care.  6. Chronic pain and anxiety. Appear to be in no distress discontinue narcotics, have tapered down benzodiazepine to nighttime only as needed.  7. Marijuana use. Counseled to quit, he says his wife provided him with marijuana on the day of admission.  8. GERD. Continue PPI.  9. DM type II. In good control continue home medication. Given written instructions on Accu-Cheks every before  meals at bedtime and to maintain a logbook and show it to PCP next visit.  Lab Results  Component Value Date   HGBA1C 5.9* 12/07/2015   CBG (last 3)   Recent Labs  12/09/15 1659 12/09/15 2208 12/10/15 0811  GLUCAP 106* 146* 111*       Follow UP  Follow-up Information    Follow up with DANIEL,JANICE, NP. Schedule an appointment as soon as possible for a visit in 2 days.   Specialty:  Nurse Practitioner   Contact information:   [address] [place] [address] [phone]        Consults obtained - neuro  Discharge Condition:  Stable  Diet and Activity recommendation: See Discharge Instructions below  Discharge Instructions       Discharge Instructions    Discharge instructions    Complete by:  As directed   Follow with Primary MD DANIEL,JANICE, NP in 2-3 days   Get CBC, CMP, 2 view Chest X ray checked  by Primary MD next visit.    Activity: As tolerated with Full fall precautions use walker/cane & assistance as needed   Disposition Home     Diet:   Heart Healthy Low carb.  Accuchecks 4 times/day, Once in AM empty stomach and then before each meal. Log in all results and show them to your Prim.MD in 3 days. If any glucose reading is under 80 or above 300 call your Prim MD immidiately. Follow Low glucose instructions for glucose under 80 as instructed.   For Heart failure patients - Check your Weight same time everyday, if you gain over 2 pounds, or you develop in leg swelling, experience more shortness of breath or chest pain, call your Primary MD immediately. Follow Cardiac Low Salt Diet and 1.5 lit/day fluid restriction.   On your next visit with your primary care physician please Get Medicines reviewed and adjusted.   Please request your Prim.MD to go over all Hospital Tests and Procedure/Radiological results at the follow up, please get all Hospital records sent to your Prim MD by signing hospital release before you go home.   If you  experience worsening of your admission symptoms, develop shortness of breath, life threatening emergency, suicidal or homicidal thoughts you must seek medical attention immediately by calling 911 or calling your MD immediately  if symptoms less severe.  You Must read complete instructions/literature along with all the possible adverse reactions/side effects for all the Medicines you take and that have been prescribed to you. Take any new Medicines after you have completely understood and accpet all the possible adverse reactions/side effects.   Do not drive, operating heavy machinery, perform activities at heights, swimming or participation in water activities or provide baby sitting services if your were admitted for syncope or siezures until you have seen by Primary MD or a Neurologist and advised to do so again.  Do not drive when taking Pain medications.    Do not take more than prescribed Pain, Sleep and Anxiety Medications  Special Instructions: If you have smoked or chewed Tobacco  in the last 2 yrs please stop smoking, stop any regular Alcohol  and or any Recreational drug use.  Wear Seat belts while driving.   Please note  You were cared for by a hospitalist during your hospital stay. If you have any questions about your discharge medications or the care you received while you were in the hospital after you are discharged, you can call the unit and asked to speak with the hospitalist on call if the hospitalist that took care of you is not available. Once you are discharged, your primary care physician will handle any further medical issues. Please note that NO REFILLS for any discharge medications will be authorized once you are discharged, as it is imperative that you return to your primary care physician (or establish a relationship with a primary care physician if you do not have one) for your aftercare needs so that they can reassess your need for medications and monitor your lab  values.     Increase activity slowly    Complete by:  As directed  Discharge Medications       Medication List    STOP taking these medications        HYDROcodone-acetaminophen 5-325 MG tablet  Commonly known as:  NORCO/VICODIN      TAKE these medications        ALPRAZolam 0.5 MG tablet  Commonly known as:  XANAX  Take 1 tablet (0.5 mg total) by mouth at bedtime as needed for anxiety.     aspirin EC 81 MG tablet  Take 81 mg by mouth daily.     doxycycline 100 MG tablet  Commonly known as:  VIBRA-TABS  Take 1 tablet (100 mg total) by mouth every 12 (twelve) hours.     fish oil-omega-3 fatty acids 1000 MG capsule  Take 1 g by mouth 3 (three) times daily.     FLUoxetine 40 MG capsule  Commonly known as:  PROZAC  Take 40 mg by mouth daily.     furosemide 20 MG tablet  Commonly known as:  LASIX  Take 20 mg by mouth daily.     glimepiride 2 MG tablet  Commonly known as:  AMARYL  Take 2 mg by mouth daily before breakfast.     lactulose 10 GM/15ML solution  Commonly known as:  CHRONULAC  Take 60 mLs (40 g total) by mouth 3 (three) times daily. Titrate for 3-4 bowel motions daily.     levETIRAcetam 500 MG tablet  Commonly known as:  KEPPRA  Take 500 mg by mouth 2 (two) times daily.     lidocaine 2 % jelly  Commonly known as:  XYLOCAINE  Apply 1 application topically as needed. Irritation/pain     metoprolol tartrate 25 MG tablet  Commonly known as:  LOPRESSOR  Take 25-50 mg by mouth 2 (two) times daily. Take 25 mg in the morning and 50 mg at night     multivitamin with minerals Tabs tablet  Take 1 tablet by mouth daily.     omeprazole 20 MG capsule  Commonly known as:  PRILOSEC  Take 1 capsule (20 mg total) by mouth daily.     potassium chloride SA 20 MEQ tablet  Commonly known as:  K-DUR,KLOR-CON  Take 1 tablet (20 mEq total) by mouth daily.     rifaximin 200 MG tablet  Commonly known as:  XIFAXAN  Take 2 tablets (400 mg total) by  mouth every 8 (eight) hours.     rifaximin 550 MG Tabs tablet  Commonly known as:  XIFAXAN  Take 1 tablet (550 mg total) by mouth 2 (two) times daily.  Start taking on:  12/13/2015     Vitamin D 2000 units tablet  Take 1 tablet (2,000 Units total) by mouth daily.        Major procedures and Radiology Reports - PLEASE review detailed and final reports for all details, in brief -       Dg Chest 2 View  11/23/2015  CLINICAL DATA:  Altered mental status, confusion EXAM: CHEST  2 VIEW COMPARISON:  01/03/2014 FINDINGS: Rotated exam to the right. Cardiomegaly evident without CHF or definite pneumonia. No collapse or consolidation. Negative for edema or pneumothorax. Monitor leads overlie the chest. No acute osseous finding. IMPRESSION: Rotated exam. Mild cardiomegaly without acute process. No significant interval change. Electronically Signed   By: Jerilynn Mages.  Shick M.D.   On: 11/23/2015 14:04   Ct Head Wo Contrast  12/07/2015  CLINICAL DATA:  Altered mental status, found unresponsive on bathroom floor this morning EXAM: CT HEAD  WITHOUT CONTRAST TECHNIQUE: Contiguous axial images were obtained from the base of the skull through the vertex without intravenous contrast. COMPARISON:  10/23/2013 FINDINGS: The bony calvarium is intact. No acute soft tissue abnormality is noted. Chronic calcification of the right globe is seen. Changes of prior mastoidectomy are noted bilaterally and stable. No findings to suggest acute hemorrhage, acute infarction or space-occupying mass lesion are noted. IMPRESSION: Chronic changes without acute abnormality. Electronically Signed   By: Inez Catalina M.D.   On: 12/07/2015 16:36   Mr Brain Wo Contrast  12/08/2015  CLINICAL DATA:  Altered mental status. Fall today. Cirrhosis, hypertension, diabetes, hyperlipidemia. Seizure disorder. EXAM: MRI HEAD WITHOUT CONTRAST TECHNIQUE: Multiplanar, multiecho pulse sequences of the brain and surrounding structures were obtained without  intravenous contrast. COMPARISON:  CT 12/07/2015.  MRI 01/29/2011 FINDINGS: Ventricle size normal.  Cerebral volume normal. Negative for acute infarct. Scattered small white matter hyperintensities in the deep white matter bilateral have developed since the prior MRI. This most likely related to chronic microvascular ischemia. No cortical infarct. T1 hyperintensity in the basal ganglia bilaterally compatible with cirrhosis of liver. This is unchanged. Negative for intracranial hemorrhage.  Negative for mass or edema. Brainstem and cerebellum normal. Chronic injury to the right globe likely retinal hemorrhage. Left globe appears normal. Mild mucosal edema in the paranasal sinuses. Normal pituitary. Normal skullbase. Image quality degraded by mild motion. IMPRESSION: No acute abnormality Mild chronic white matter changes have progressed since 2012 and likely due to microvascular ischemia Electronically Signed   By: Franchot Gallo M.D.   On: 12/08/2015 12:39   Dg Chest Portable 1 View  12/07/2015  CLINICAL DATA:  Altered mental status.  Unwitnessed fall. EXAM: PORTABLE CHEST 1 VIEW COMPARISON:  11/23/2015 and 01/03/2014. FINDINGS: 1602 hours. There is stable cardiomegaly. The mediastinal contours are unchanged. The lungs appear clear. There is no pleural effusion or pneumothorax. No acute osseous findings are seen. IMPRESSION: Stable cardiomegaly without evidence of acute process. Electronically Signed   By: Richardean Sale M.D.   On: 12/07/2015 16:29    Micro Results      Recent Results (from the past 240 hour(s))  Culture, blood (Routine X 2) w Reflex to ID Panel     Status: None (Preliminary result)   Collection Time: 12/07/15  2:04 PM  Result Value Ref Range Status   Specimen Description BLOOD RIGHT ARM  Final   Special Requests BOTTLES DRAWN AEROBIC AND ANAEROBIC 5CC  Final   Culture NO GROWTH 2 DAYS  Final   Report Status PENDING  Incomplete  Culture, blood (Routine X 2) w Reflex to ID Panel      Status: None (Preliminary result)   Collection Time: 12/07/15  3:12 PM  Result Value Ref Range Status   Specimen Description BLOOD LEFT ANTECUBITAL  Final   Special Requests BOTTLES DRAWN AEROBIC AND ANAEROBIC 10CC  Final   Culture NO GROWTH 2 DAYS  Final   Report Status PENDING  Incomplete  MRSA PCR Screening     Status: None   Collection Time: 12/08/15  3:52 AM  Result Value Ref Range Status   MRSA by PCR NEGATIVE NEGATIVE Final    Comment:        The GeneXpert MRSA Assay (FDA approved for NASAL specimens only), is one component of a comprehensive MRSA colonization surveillance program. It is not intended to diagnose MRSA infection nor to guide or monitor treatment for MRSA infections.   Culture, blood (routine x 2)  Status: None (Preliminary result)   Collection Time: 12/08/15  4:19 AM  Result Value Ref Range Status   Specimen Description BLOOD LEFT HAND  Final   Special Requests IN PEDIATRIC BOTTLE  2CC  Final   Culture NO GROWTH 1 DAY  Final   Report Status PENDING  Incomplete  Culture, blood (routine x 2)     Status: None (Preliminary result)   Collection Time: 12/08/15  4:24 AM  Result Value Ref Range Status   Specimen Description BLOOD RIGHT HAND  Final   Special Requests IN PEDIATRIC BOTTLE  3CC  Final   Culture NO GROWTH 1 DAY  Final   Report Status PENDING  Incomplete       Today   Subjective    [name] [name] today has no headache,no chest abdominal pain,no new weakness tingling or numbness, feels much better wants to go home today.     Objective   Blood pressure 138/75, pulse 60, temperature 98.3 F (36.8 C), temperature source Oral, resp. rate 18, height 5' 7"  (1.702 m), weight 102.7 kg (226 lb 6.6 oz), SpO2 97 %.   Intake/Output Summary (Last 24 hours) at 12/10/15 0945 Last data filed at 12/10/15 0100  Gross per 24 hour  Intake    483 ml  Output    760 ml  Net   -277 ml    Exam Awake Alert, Oriented x 3, No new F.N deficits, Normal  affect [place].AT,PERRAL Supple Neck,No JVD, No cervical lymphadenopathy appriciated.  Symmetrical Chest wall movement, Good air movement bilaterally, CTAB RRR,No Gallops,Rubs or new Murmurs, No Parasternal Heave +ve B.Sounds, Abd Soft, Non tender, No organomegaly appriciated, No rebound -guarding or rigidity. No Cyanosis, Clubbing or edema, No new Rash or bruise   Data Review   CBC w Diff: Lab Results  Component Value Date   WBC 1.8* 12/09/2015   WBC 5.1 11/13/2008   HGB 8.8* 12/09/2015   HGB 14.5 11/13/2008   HCT 28.8* 12/09/2015   HCT 28 04/06/2012   PLT 45* 12/09/2015   PLT 97* 11/13/2008   LYMPHOPCT 33 11/23/2015   LYMPHOPCT 45.4 11/13/2008   MONOPCT 14 11/23/2015   MONOPCT 7.9 11/13/2008   EOSPCT 4 11/23/2015   EOSPCT 3.3 11/13/2008   BASOPCT 1 11/23/2015   BASOPCT 0.7 11/13/2008    CMP: Lab Results  Component Value Date   NA 141 12/09/2015   NA 144 04/06/2012   K 3.8 12/09/2015   K 4.4 04/06/2012   CL 109 12/09/2015   CO2 23 12/09/2015   BUN <5* 12/09/2015   CREATININE 0.73 12/09/2015   CREATININE 15 04/06/2012   PROT 5.4* 12/09/2015   ALBUMIN 2.6* 12/09/2015   ALBUMIN 2.7 04/06/2012   BILITOT 1.8* 12/09/2015   BILITOT 0.9 04/06/2012   ALKPHOS 92 12/09/2015   ALKPHOS 112 04/06/2012   AST 82* 12/09/2015   AST 36 04/06/2012   ALT 22 12/09/2015  .   Total Time in preparing paper work, data evaluation and todays exam - 35 minutes  Thurnell Lose M.D on 12/10/2015 at 9:45 AM  [facility]   Office  [phone]

## 2015-12-10 NOTE — Progress Notes (Signed)
[name] [name] to be D/C'd to home per MD order.  Discussed with the patient and all questions fully answered.  VSS, Skin clean, dry and intact without evidence of skin break down, no evidence of skin tears noted. IV catheter discontinued intact. Site without signs and symptoms of complications. Dressing and pressure applied.  An After Visit Summary was printed and given to the patient's wife. Patient received prescriptions.  D/c education completed with patient/family including follow up instructions, medication list, d/c activities limitations if indicated, with other d/c instructions as indicated by MD - patient and patient's wife able to verbalize understanding, all questions fully answered.   Patient instructed to return to ED, call 911, or call MD for any changes in condition.   Patient escorted via [place], and D/C home via private auto.  [name] 12/10/2015 4:40 PM

## 2015-12-10 NOTE — Care Management Important Message (Signed)
Important Message  Patient Details  Name: [name] [name] MRN: [id number] Date of Birth: 1960-11-22   Medicare Important Message Given:  Yes    [name] 12/10/2015, 10:01 AM

## 2015-12-10 NOTE — Discharge Instructions (Signed)
Follow with Primary MD DANIEL,JANICE, NP in 2-3 days   Get CBC, CMP, 2 view Chest X ray checked  by Primary MD next visit.    Activity: As tolerated with Full fall precautions use walker/cane & assistance as needed   Disposition Home     Diet:   Heart Healthy Low carb.  Accuchecks 4 times/day, Once in AM empty stomach and then before each meal. Log in all results and show them to your Prim.MD in 3 days. If any glucose reading is under 80 or above 300 call your Prim MD immidiately. Follow Low glucose instructions for glucose under 80 as instructed.   For Heart failure patients - Check your Weight same time everyday, if you gain over 2 pounds, or you develop in leg swelling, experience more shortness of breath or chest pain, call your Primary MD immediately. Follow Cardiac Low Salt Diet and 1.5 lit/day fluid restriction.   On your next visit with your primary care physician please Get Medicines reviewed and adjusted.   Please request your Prim.MD to go over all Hospital Tests and Procedure/Radiological results at the follow up, please get all Hospital records sent to your Prim MD by signing hospital release before you go home.   If you experience worsening of your admission symptoms, develop shortness of breath, life threatening emergency, suicidal or homicidal thoughts you must seek medical attention immediately by calling 911 or calling your MD immediately  if symptoms less severe.  You Must read complete instructions/literature along with all the possible adverse reactions/side effects for all the Medicines you take and that have been prescribed to you. Take any new Medicines after you have completely understood and accpet all the possible adverse reactions/side effects.   Do not drive, operating heavy machinery, perform activities at heights, swimming or participation in water activities or provide baby sitting services if your were admitted for syncope or siezures until you have  seen by Primary MD or a Neurologist and advised to do so again.  Do not drive when taking Pain medications.    Do not take more than prescribed Pain, Sleep and Anxiety Medications  Special Instructions: If you have smoked or chewed Tobacco  in the last 2 yrs please stop smoking, stop any regular Alcohol  and or any Recreational drug use.  Wear Seat belts while driving.   Please note  You were cared for by a hospitalist during your hospital stay. If you have any questions about your discharge medications or the care you received while you were in the hospital after you are discharged, you can call the unit and asked to speak with the hospitalist on call if the hospitalist that took care of you is not available. Once you are discharged, your primary care physician will handle any further medical issues. Please note that NO REFILLS for any discharge medications will be authorized once you are discharged, as it is imperative that you return to your primary care physician (or establish a relationship with a primary care physician if you do not have one) for your aftercare needs so that they can reassess your need for medications and monitor your lab values.

## 2015-12-12 LAB — CULTURE, BLOOD (ROUTINE X 2)
CULTURE: NO GROWTH
Culture: NO GROWTH

## 2015-12-13 LAB — CULTURE, BLOOD (ROUTINE X 2)
CULTURE: NO GROWTH
CULTURE: NO GROWTH

## 2016-10-29 ENCOUNTER — Encounter (HOSPITAL_COMMUNITY): Payer: Self-pay

## 2016-10-29 ENCOUNTER — Emergency Department (HOSPITAL_COMMUNITY)
Admission: EM | Admit: 2016-10-29 | Discharge: 2016-11-03 | Disposition: A | Discharge status: Home / Self Care | Payer: Medicare HMO | Attending: Emergency Medicine | Admitting: Emergency Medicine

## 2016-10-29 DIAGNOSIS — Z7982 Long term (current) use of aspirin: Secondary | ICD-10-CM | POA: Insufficient documentation

## 2016-10-29 DIAGNOSIS — F17210 Nicotine dependence, cigarettes, uncomplicated: Secondary | ICD-10-CM | POA: Insufficient documentation

## 2016-10-29 DIAGNOSIS — Z79899 Other long term (current) drug therapy: Secondary | ICD-10-CM | POA: Insufficient documentation

## 2016-10-29 DIAGNOSIS — J449 Chronic obstructive pulmonary disease, unspecified: Secondary | ICD-10-CM | POA: Diagnosis not present

## 2016-10-29 DIAGNOSIS — F32A Depression, unspecified: Secondary | ICD-10-CM

## 2016-10-29 DIAGNOSIS — F419 Anxiety disorder, unspecified: Secondary | ICD-10-CM | POA: Diagnosis present

## 2016-10-29 DIAGNOSIS — F329 Major depressive disorder, single episode, unspecified: Secondary | ICD-10-CM | POA: Diagnosis not present

## 2016-10-29 DIAGNOSIS — E119 Type 2 diabetes mellitus without complications: Secondary | ICD-10-CM | POA: Insufficient documentation

## 2016-10-29 DIAGNOSIS — I10 Essential (primary) hypertension: Secondary | ICD-10-CM | POA: Insufficient documentation

## 2016-10-29 DIAGNOSIS — R45851 Suicidal ideations: Secondary | ICD-10-CM

## 2016-10-29 LAB — CBC WITH DIFFERENTIAL/PLATELET
BASOS ABS: 0 10*3/uL (ref 0.0–0.1)
BASOS PCT: 1 %
Eosinophils Absolute: 0.3 10*3/uL (ref 0.0–0.7)
Eosinophils Relative: 6 %
HEMATOCRIT: 35.6 % — AB (ref 39.0–52.0)
HEMOGLOBIN: 12 g/dL — AB (ref 13.0–17.0)
Lymphocytes Relative: 21 %
Lymphs Abs: 0.9 10*3/uL (ref 0.7–4.0)
MCH: 32.6 pg (ref 26.0–34.0)
MCHC: 33.7 g/dL (ref 30.0–36.0)
MCV: 96.7 fL (ref 78.0–100.0)
Monocytes Absolute: 0.5 10*3/uL (ref 0.1–1.0)
Monocytes Relative: 12 %
NEUTROS ABS: 2.6 10*3/uL (ref 1.7–7.7)
NEUTROS PCT: 61 %
Platelets: 59 10*3/uL — ABNORMAL LOW (ref 150–400)
RBC: 3.68 MIL/uL — ABNORMAL LOW (ref 4.22–5.81)
RDW: 15 % (ref 11.5–15.5)
WBC: 4.2 10*3/uL (ref 4.0–10.5)

## 2016-10-29 LAB — COMPREHENSIVE METABOLIC PANEL
ALBUMIN: 3.1 g/dL — AB (ref 3.5–5.0)
ALT: 22 U/L (ref 17–63)
ANION GAP: 7 (ref 5–15)
AST: 42 U/L — ABNORMAL HIGH (ref 15–41)
Alkaline Phosphatase: 93 U/L (ref 38–126)
BILIRUBIN TOTAL: 2.2 mg/dL — AB (ref 0.3–1.2)
BUN: 12 mg/dL (ref 6–20)
CHLORIDE: 108 mmol/L (ref 101–111)
CO2: 23 mmol/L (ref 22–32)
Calcium: 8.8 mg/dL — ABNORMAL LOW (ref 8.9–10.3)
Creatinine, Ser: 0.73 mg/dL (ref 0.61–1.24)
GFR calc Af Amer: >60 mL/min (ref >60)
GLUCOSE: 103 mg/dL — AB (ref 65–99)
POTASSIUM: 3.9 mmol/L (ref 3.5–5.1)
Sodium: 138 mmol/L (ref 135–145)
TOTAL PROTEIN: 6.1 g/dL — AB (ref 6.5–8.1)

## 2016-10-29 LAB — RAPID URINE DRUG SCREEN, HOSP PERFORMED
AMPHETAMINES: NOT DETECTED
BARBITURATES: NOT DETECTED
BENZODIAZEPINES: NOT DETECTED
COCAINE: NOT DETECTED
Opiates: NOT DETECTED
TETRAHYDROCANNABINOL: NOT DETECTED

## 2016-10-29 LAB — ETHANOL

## 2016-10-29 MED ORDER — RIFAXIMIN 200 MG PO TABS
400.0000 mg | ORAL_TABLET | Freq: Three times a day (TID) | ORAL | Status: DC
  Filled 2016-10-29 (×7): qty 2

## 2016-10-29 MED ORDER — ASPIRIN EC 81 MG PO TBEC
81.0000 mg | DELAYED_RELEASE_TABLET | Freq: Every day | ORAL | Status: DC
  Administered 2016-10-30 – 2016-11-03 (×5): 81 mg via ORAL
  Filled 2016-10-29 (×5): qty 1

## 2016-10-29 MED ORDER — LACTULOSE 10 GM/15ML PO SOLN
40.0000 g | Freq: Three times a day (TID) | ORAL | Status: DC
  Administered 2016-10-31 – 2016-11-03 (×6): 40 g via ORAL
  Filled 2016-10-29 (×9): qty 60

## 2016-10-29 MED ORDER — OMEGA-3-ACID ETHYL ESTERS 1 G PO CAPS
1.0000 g | ORAL_CAPSULE | Freq: Three times a day (TID) | ORAL | Status: DC
  Administered 2016-10-30 – 2016-11-03 (×10): 1 g via ORAL
  Filled 2016-10-29 (×19): qty 1

## 2016-10-29 MED ORDER — ALPRAZOLAM 0.5 MG PO TABS
0.5000 mg | ORAL_TABLET | Freq: Every evening | ORAL | Status: DC | PRN
  Administered 2016-10-29 – 2016-11-02 (×3): 0.5 mg via ORAL
  Filled 2016-10-29 (×3): qty 1

## 2016-10-29 MED ORDER — GLIMEPIRIDE 2 MG PO TABS
2.0000 mg | ORAL_TABLET | Freq: Every day | ORAL | Status: DC
  Administered 2016-10-30 – 2016-11-03 (×5): 2 mg via ORAL
  Filled 2016-10-29 (×7): qty 1

## 2016-10-29 MED ORDER — LEVETIRACETAM 500 MG PO TABS
500.0000 mg | ORAL_TABLET | Freq: Two times a day (BID) | ORAL | Status: DC
  Administered 2016-10-29 – 2016-11-03 (×9): 500 mg via ORAL
  Filled 2016-10-29 (×10): qty 1

## 2016-10-29 MED ORDER — VITAMIN D 1000 UNITS PO TABS
2000.0000 [IU] | ORAL_TABLET | Freq: Every day | ORAL | Status: DC
  Administered 2016-10-30 – 2016-11-02 (×4): 2000 [IU] via ORAL
  Filled 2016-10-29 (×7): qty 2

## 2016-10-29 MED ORDER — PANTOPRAZOLE SODIUM 40 MG PO TBEC
40.0000 mg | DELAYED_RELEASE_TABLET | Freq: Every day | ORAL | Status: DC
  Administered 2016-10-30 – 2016-11-03 (×5): 40 mg via ORAL
  Filled 2016-10-29 (×5): qty 1

## 2016-10-29 MED ORDER — METOPROLOL TARTRATE 50 MG PO TABS
50.0000 mg | ORAL_TABLET | Freq: Every day | ORAL | Status: DC
  Administered 2016-10-29 – 2016-11-02 (×4): 50 mg via ORAL
  Filled 2016-10-29 (×5): qty 1

## 2016-10-29 MED ORDER — RIFAXIMIN 550 MG PO TABS
550.0000 mg | ORAL_TABLET | Freq: Two times a day (BID) | ORAL | Status: DC
  Administered 2016-10-30 – 2016-11-03 (×7): 550 mg via ORAL
  Filled 2016-10-29 (×13): qty 1

## 2016-10-29 MED ORDER — POTASSIUM CHLORIDE CRYS ER 20 MEQ PO TBCR
20.0000 meq | EXTENDED_RELEASE_TABLET | Freq: Every day | ORAL | Status: DC
  Administered 2016-10-31 – 2016-11-03 (×4): 20 meq via ORAL
  Filled 2016-10-29 (×4): qty 1

## 2016-10-29 MED ORDER — FLUOXETINE HCL 20 MG PO CAPS
40.0000 mg | ORAL_CAPSULE | Freq: Every day | ORAL | Status: DC
  Administered 2016-10-30 – 2016-11-03 (×5): 40 mg via ORAL
  Filled 2016-10-29 (×5): qty 2

## 2016-10-29 MED ORDER — ADULT MULTIVITAMIN W/MINERALS CH
1.0000 | ORAL_TABLET | Freq: Every day | ORAL | Status: DC
  Administered 2016-10-30 – 2016-11-03 (×5): 1 via ORAL
  Filled 2016-10-29 (×5): qty 1

## 2016-10-29 MED ORDER — METOPROLOL TARTRATE 25 MG PO TABS
25.0000 mg | ORAL_TABLET | Freq: Every day | ORAL | Status: DC
  Administered 2016-10-30 – 2016-11-03 (×5): 25 mg via ORAL
  Filled 2016-10-29 (×5): qty 1

## 2016-10-29 MED ORDER — METOPROLOL TARTRATE 25 MG PO TABS: 25.0000 mg | ORAL_TABLET | Freq: Two times a day (BID) | ORAL | Status: DC

## 2016-10-29 MED ORDER — FUROSEMIDE 40 MG PO TABS
20.0000 mg | ORAL_TABLET | Freq: Every day | ORAL | Status: DC
Start: 2016-10-29 — End: 2016-11-03
  Administered 2016-10-30 – 2016-11-03 (×5): 20 mg via ORAL
  Filled 2016-10-29 (×5): qty 1

## 2016-10-29 NOTE — ED Provider Notes (Signed)
[facility] DEPT Provider Note   CSN: [id number] Arrival date & time: 10/29/16  1431     History   Chief Complaint Chief Complaint  Patient presents with  . Anxiety    HPI [name] [name] is a 56 y.o. male.   Anxiety     Pt was seen at 1555.  Per pt and his family, c/o gradual onset and worsening of persistent anxiety, depression and SI for the past several years, worse today. Pt states he has not been taking his medications over the past few weeks. Pt states he "feels sad" and "lonely." Pt also states he has been having SI "by shooting myself." Denies SA, no HI, no hallucinations.   Past Medical History:  Diagnosis Date  . Anemia   . Anxiety   . Arthritis   . Bipolar 1 disorder ([place])   . Blind right eye   . Blood clots in brain    per patient  . Cardiomegaly   . Cataract    Right eye  . Cerebrovascular disease    Right vertebral artery  . Cholelithiasis    Asymptomatic  . Chronic back pain   . Cirrhosis ([place])    suspected NASH  . Cirrhosis ([place])    diagnosed 07/2010 when presented with first episode of hepatic encephalopathy, afp on 416/13=4.8,  U/S on 12/14/11  liver stable, pt has received 2 hep A/B vaccines  . COPD (chronic obstructive pulmonary disease) ([place])   . Depression   . Diabetes mellitus   . Diverticulosis   . GERD (gastroesophageal reflux disease)   . Hard of hearing   . Hepatic encephalopathy ([place])   . Hyperlipidemia   . Hypertension   . Mental retardation   . Seizures ([place])   . Thrombocytopenia [facility])     Patient Active Problem List   Diagnosis Date Noted  . Altered mental state 12/07/2015  . Acute hepatic encephalopathy ([place]) 08/02/2013  . Hypokalemia 02/01/2013  . Hyperammonemia ([place]) 01/31/2013  . Urinary incontinence 01/31/2013  . Pancytopenia ([place]) 09/17/2012  . Edema of right lower extremity 04/28/2012  . Hepatic encephalopathy ([place]) 04/28/2012  . Arthritis of ankle or foot, degenerative 04/26/2012  . Hematemesis 12/07/2011    . Thrombocytopenia ([place]) 12/07/2011  . Anemia 12/07/2011  . COPD (chronic obstructive pulmonary disease) ([place]) 08/25/2011  . DM (diabetes mellitus) ([place]) 08/25/2011  . HTN (hypertension) 08/25/2011  . Coagulopathy ([place]) 08/25/2011  . Cirrhosis ([place]) 08/25/2011  . Depression 08/25/2011  . Seizure disorder ([place]) 08/25/2011  . GERD (gastroesophageal reflux disease) 08/25/2011  . Tobacco abuse 08/25/2011  . Ankle fracture, bimalleolar, closed 08/04/2011    Past Surgical History:  Procedure Laterality Date  . CAST APPLICATION  02/27/6753   Procedure: MINOR CAST APPLICATION;  Surgeon: [name], MD;  Location: [facility] ORS;  Service: Orthopedics;  Laterality: Right;  no anesthesia , procedure room do not need or room !  . COLONOSCOPY  12/15/11   colonic divericulosis;poor prep, ACBE  recommended but has not been done  . ear operations    . ESOPHAGOGASTRODUODENOSCOPY  02/02/2011   Rourk-Normal esophagus.  No varices/Question mild portal gastropathy, extrinsic compression on the antrum, lesser curvature of uncertain significance, some minimally  nodular mucosa status post biopsy, patent pylorus, normal duodenum 1 and duodenum 2.  . ESOPHAGOGASTRODUODENOSCOPY  12/15/11   Rourk-->mild changes of portal gastropathy, gastric/duodenal erosions s/p bx  (reactive changes with mild chronic inflammation. No H.pylori  . FOOT SURGERY    . ORIF ANKLE FRACTURE  08/27/2011  Procedure: OPEN REDUCTION INTERNAL FIXATION (ORIF) ANKLE FRACTURE;  Surgeon: [name], MD;  Location: [facility] ORS;  Service: Orthopedics;  Laterality: Right;  . TONSILLECTOMY         Home Medications    Prior to Admission medications   Medication Sig Start Date End Date Taking? Authorizing Provider  ALPRAZolam Duanne Moron) 0.5 MG tablet Take 1 tablet (0.5 mg total) by mouth at bedtime as needed for anxiety. 12/10/15   Thurnell Lose, MD  aspirin EC 81 MG tablet Take 81 mg by mouth daily.    Historical Provider, MD  Cholecalciferol  (VITAMIN D) 2000 UNITS tablet Take 1 tablet (2,000 Units total) by mouth daily. 09/20/12   Nimish Luther Parody, MD  doxycycline (VIBRA-TABS) 100 MG tablet Take 1 tablet (100 mg total) by mouth every 12 (twelve) hours. 12/10/15   Thurnell Lose, MD  fish oil-omega-3 fatty acids 1000 MG capsule Take 1 g by mouth 3 (three) times daily.    Historical Provider, MD  FLUoxetine (PROZAC) 40 MG capsule Take 40 mg by mouth daily. 10/26/13   Historical Provider, MD  furosemide (LASIX) 20 MG tablet Take 20 mg by mouth daily.    Historical Provider, MD  glimepiride (AMARYL) 2 MG tablet Take 2 mg by mouth daily before breakfast.    Historical Provider, MD  lactulose (CHRONULAC) 10 GM/15ML solution Take 60 mLs (40 g total) by mouth 3 (three) times daily. Titrate for 3-4 bowel motions daily. 01/03/14   Carmin Muskrat, MD  levETIRAcetam (KEPPRA) 500 MG tablet Take 500 mg by mouth 2 (two) times daily.    Historical Provider, MD  lidocaine (XYLOCAINE) 2 % jelly Apply 1 application topically as needed. Irritation/pain    Historical Provider, MD  metoprolol tartrate (LOPRESSOR) 25 MG tablet Take 25-50 mg by mouth 2 (two) times daily. Take 25 mg in the morning and 50 mg at night    Historical Provider, MD  Multiple Vitamin (MULTIVITAMIN WITH MINERALS) TABS Take 1 tablet by mouth daily.    Historical Provider, MD  omeprazole (PRILOSEC) 20 MG capsule Take 1 capsule (20 mg total) by mouth daily. 09/20/12   Nimish Luther Parody, MD  potassium chloride SA (K-DUR,KLOR-CON) 20 MEQ tablet Take 1 tablet (20 mEq total) by mouth daily. 09/20/12   Nimish Luther Parody, MD  rifaximin (XIFAXAN) 200 MG tablet Take 2 tablets (400 mg total) by mouth every 8 (eight) hours. 12/10/15   Thurnell Lose, MD  rifaximin (XIFAXAN) 550 MG TABS tablet Take 1 tablet (550 mg total) by mouth 2 (two) times daily. 12/13/15   Thurnell Lose, MD    Family History Family History  Problem Relation Age of Onset  . Heart failure Mother   . Thyroid disease Mother   .  Cancer Father   . Asthma Brother   . Heart attack Brother   . Cirrhosis Brother     EtOH  . Colon cancer Maternal Aunt     Social History Social History  Substance Use Topics  . Smoking status: Current Every Day Smoker    Packs/day: 1.00    Years: 30.00    Types: Cigarettes  . Smokeless tobacco: Never Used     Comment: only smokes about 1-2 a day  . Alcohol use No     Allergies   Penicillins   Review of Systems Review of Systems ROS: Statement: All systems negative except as marked or noted in the HPI; Constitutional: Negative for fever and chills. ; ; Eyes: Negative for  eye pain, redness and discharge. ; ; ENMT: Negative for ear pain, hoarseness, nasal congestion, sinus pressure and sore throat. ; ; Cardiovascular: Negative for chest pain, palpitations, diaphoresis, dyspnea and peripheral edema. ; ; Respiratory: Negative for cough, wheezing and stridor. ; ; Gastrointestinal: Negative for nausea, vomiting, diarrhea, abdominal pain, blood in stool, hematemesis, jaundice and rectal bleeding. . ; ; Genitourinary: Negative for dysuria, flank pain and hematuria. ; ; Musculoskeletal: Negative for back pain and neck pain. Negative for swelling and trauma.; ; Skin: Negative for pruritus, rash, abrasions, blisters, bruising and skin lesion.; ; Neuro: Negative for headache, lightheadedness and neck stiffness. Negative for weakness, altered level of consciousness, altered mental status, extremity weakness, paresthesias, involuntary movement, seizure and syncope.; Psych:  +depression, +anxiety, +SI. No SA, no HI, no hallucinations.     Physical Exam Updated Vital Signs BP 190/79 (BP Location: Left Arm)   Pulse 101   Temp 97.9 F (36.6 C) (Oral)   Resp 17   Ht 5' 7"  (1.702 m)   Wt 227 lb (103 kg)   SpO2 98%   BMI 35.55 kg/m   Physical Exam 1600: Physical examination:  Nursing notes reviewed; Vital signs and O2 SAT reviewed;  Constitutional: Well developed, Well nourished, Well  hydrated, In no acute distress; Head:  Normocephalic, atraumatic; Eyes: EOMI, PERRL, No scleral icterus; ENMT: Mouth and pharynx normal, Mucous membranes moist; Neck: Supple, Full range of motion, No lymphadenopathy; Cardiovascular: Regular rate and rhythm, No gallop; Respiratory: Breath sounds clear & equal bilaterally, No wheezes.  Speaking full sentences with ease, Normal respiratory effort/excursion; Chest: Nontender, Movement normal; Abdomen: Soft, Nontender, Nondistended, Normal bowel sounds; Genitourinary: No CVA tenderness; Extremities: Pulses normal, No tenderness, No edema, No calf edema or asymmetry.; Neuro: AA&Ox3, Blind right eye and HOH per hx. No facial droop.  Speech clear. No gross focal motor deficits in extremities.; Skin: Color normal, Warm, Dry.; Psych:  Affect flat.    ED Treatments / Results  Labs (all labs ordered are listed, but only abnormal results are displayed)   EKG  EKG Interpretation None       Radiology   Procedures Procedures (including critical care time)  Medications Ordered in ED Medications - No data to display   Initial Impression / Assessment and Plan / ED Course  I have reviewed the triage vital signs and the nursing notes.  Pertinent labs & imaging results that were available during my care of the patient were reviewed by me and considered in my medical decision making (see chart for details).  MDM Reviewed: previous chart, nursing note and vitals Reviewed previous: labs, MRI and CT scan Interpretation: labs     Results for orders placed or performed during the hospital encounter of 10/29/16  Comprehensive metabolic panel  Result Value Ref Range   Sodium 138 135 - 145 mmol/L   Potassium 3.9 3.5 - 5.1 mmol/L   Chloride 108 101 - 111 mmol/L   CO2 23 22 - 32 mmol/L   Glucose, Bld 103 (H) 65 - 99 mg/dL   BUN 12 6 - 20 mg/dL   Creatinine, Ser 0.73 0.61 - 1.24 mg/dL   Calcium 8.8 (L) 8.9 - 10.3 mg/dL   Total Protein 6.1 (L) 6.5 -  8.1 g/dL   Albumin 3.1 (L) 3.5 - 5.0 g/dL   AST 42 (H) 15 - 41 U/L   ALT 22 17 - 63 U/L   Alkaline Phosphatase 93 38 - 126 U/L   Total Bilirubin 2.2 (H) 0.3 -  1.2 mg/dL   GFR calc non Af Amer >60 >60 mL/min   GFR calc Af Amer >60 >60 mL/min   Anion gap 7 5 - 15  Ethanol  Result Value Ref Range   Alcohol, Ethyl (B) <5 <5 mg/dL  CBC with Differential  Result Value Ref Range   WBC 4.2 4.0 - 10.5 K/uL   RBC 3.68 (L) 4.22 - 5.81 MIL/uL   Hemoglobin 12.0 (L) 13.0 - 17.0 g/dL   HCT 35.6 (L) 39.0 - 52.0 %   MCV 96.7 78.0 - 100.0 fL   MCH 32.6 26.0 - 34.0 pg   MCHC 33.7 30.0 - 36.0 g/dL   RDW 15.0 11.5 - 15.5 %   Platelets 59 (L) 150 - 400 K/uL   Neutrophils Relative % 61 %   Neutro Abs 2.6 1.7 - 7.7 K/uL   Lymphocytes Relative 21 %   Lymphs Abs 0.9 0.7 - 4.0 K/uL   Monocytes Relative 12 %   Monocytes Absolute 0.5 0.1 - 1.0 K/uL   Eosinophils Relative 6 %   Eosinophils Absolute 0.3 0.0 - 0.7 K/uL   Basophils Relative 1 %   Basophils Absolute 0.0 0.0 - 0.1 K/uL  Urine rapid drug screen (hosp performed)  Result Value Ref Range   Opiates NONE DETECTED NONE DETECTED   Cocaine NONE DETECTED NONE DETECTED   Benzodiazepines NONE DETECTED NONE DETECTED   Amphetamines NONE DETECTED NONE DETECTED   Tetrahydrocannabinol NONE DETECTED NONE DETECTED   Barbiturates NONE DETECTED NONE DETECTED     1605:  Pt states he "has blood clots on my brain."   MRI brain dated 12/08/2015:  "IMPRESSION: No acute abnormality. Mild chronic white matter changes have progressed since 2012 and likely due to microvascular ischemia.  Electronically Signed By: Franchot Gallo M.D.  On: 12/08/2015 12:39."  9292:  TTS evaluation pending.    2020:  TTS has evaluated pt: inpt treatment recommended, placement pending. Holding orders written.   Final Clinical Impressions(s) / ED Diagnoses     New Prescriptions New Prescriptions   No medications on file      Francine Graven, DO 10/29/16 2020

## 2016-10-29 NOTE — BH Assessment (Addendum)
Tele Assessment Note   [name] [name] is an 56 y.o. married male who presents unaccompanied to [facility] ED reporting symptoms of depression, anxiety and auditory hallucinations. Pt reports a history of bipolar disorder and says he is no longer prescribed psychiatric medications and cannot explain why. He says he feels like there is something wrong with him. Pt reports symptoms including crying spells, social withdrawal, loss of interest in usual pleasures, fatigue, decreased concentration, decreased sleep, decreased appetite and feelings of guilt and hopelessness. He says he feels sad and lonely. Pt acknowledges he has difficulty with memory. He reports suicidal ideation with thoughts of shooting himself but denies current intent. Pt denies access to firearms. He reports one previous suicide attempt in the distant past. Pt reports he was hearing people talking in his house but his wife told him there was no one there. Pt denies visual hallucinations. Pt denies homicidal ideation or history of violence.  Pt identifies medical problems as his primary stressor. Pt describes problems with his legs, liver, brain, ammonia levels and various other concerns. Pt says he needs assistance with bathing, dressing and uses a cane to ambulate. He says he is often incontinent of bowels and urine and wears pull-up underwear. He appears to have hearing loss in at least one ear. He describes having multiple losses including death of his sister by overdose, death of mother and says "I've had a hard life." Pt lives with his wife and identifies her as his primary support. Pt reports he has received mental health treatment at [facility] but denies recent treatment. Pt reports he has been psychiatrically hospitalized in the past but cannot specify dates or locations.  Pt is dressed in hospital scrubs. He is alert and oriented to person, place and situation but not date. Eye contact is good and Pt is tearful. Pt's  mood is depressed and anxious; affect is congruent with mood. Thought process is circumstantial. Pt appears to have limited insight. Pt says he will follow the recommendation provided by the psychiatrist and says he is motivated for treatment.     Diagnosis: Bipolar I Disorder, Current Episode Depressed, Severe With Psychotic Features  Past Medical History:  Past Medical History:  Diagnosis Date  . Anemia   . Anxiety   . Arthritis   . Bipolar 1 disorder ([place])   . Blind right eye   . Blood clots in brain    per patient  . Cardiomegaly   . Cataract    Right eye  . Cerebrovascular disease    Right vertebral artery  . Cholelithiasis    Asymptomatic  . Chronic back pain   . Cirrhosis ([place])    suspected NASH  . Cirrhosis ([place])    diagnosed 07/2010 when presented with first episode of hepatic encephalopathy, afp on 416/13=4.8,  U/S on 12/14/11  liver stable, pt has received 2 hep A/B vaccines  . COPD (chronic obstructive pulmonary disease) ([place])   . Depression   . Diabetes mellitus   . Diverticulosis   . GERD (gastroesophageal reflux disease)   . Hard of hearing   . Hepatic encephalopathy ([place])   . Hyperlipidemia   . Hypertension   . Mental retardation   . Seizures ([place])   . Thrombocytopenia ([place])     Past Surgical History:  Procedure Laterality Date  . CAST APPLICATION  08/30/4079   Procedure: MINOR CAST APPLICATION;  Surgeon: [name], MD;  Location: [facility] ORS;  Service: Orthopedics;  Laterality: Right;  no  anesthesia , procedure room do not need or room !  . COLONOSCOPY  12/15/11   colonic divericulosis;poor prep, ACBE  recommended but has not been done  . ear operations    . ESOPHAGOGASTRODUODENOSCOPY  02/02/2011   Rourk-Normal esophagus.  No varices/Question mild portal gastropathy, extrinsic compression on the antrum, lesser curvature of uncertain significance, some minimally  nodular mucosa status post biopsy, patent pylorus, normal duodenum 1 and duodenum 2.  .  ESOPHAGOGASTRODUODENOSCOPY  12/15/11   Rourk-->mild changes of portal gastropathy, gastric/duodenal erosions s/p bx  (reactive changes with mild chronic inflammation. No H.pylori  . FOOT SURGERY    . ORIF ANKLE FRACTURE  08/27/2011   Procedure: OPEN REDUCTION INTERNAL FIXATION (ORIF) ANKLE FRACTURE;  Surgeon: [name], MD;  Location: [facility] ORS;  Service: Orthopedics;  Laterality: Right;  . TONSILLECTOMY      Family History:  Family History  Problem Relation Age of Onset  . Heart failure Mother   . Thyroid disease Mother   . Cancer Father   . Asthma Brother   . Heart attack Brother   . Cirrhosis Brother     EtOH  . Colon cancer Maternal Aunt     Social History:  reports that he has been smoking Cigarettes.  He has a 30.00 pack-year smoking history. He has never used smokeless tobacco. He reports that he does not drink alcohol or use drugs.  Additional Social History:  Alcohol / Drug Use Pain Medications: See MAR Prescriptions: See MAR Over the Counter: See MAR History of alcohol / drug use?: No history of alcohol / drug abuse Longest period of sobriety (when/how long): NA  CIWA: CIWA-Ar BP: 183/62 Pulse Rate: 90 COWS:    PATIENT STRENGTHS: (choose at least two) Ability for insight Child psychotherapist Motivation for treatment/growth Supportive family/friends  Allergies:  Allergies  Allergen Reactions  . Penicillins Rash    Has patient had a PCN reaction causing immediate rash, facial/tongue/throat swelling, SOB or lightheadedness with hypotension: Yes Has patient had a PCN reaction causing severe rash involving mucus membranes or skin necrosis: No Has patient had a PCN reaction that required hospitalization No Has patient had a PCN reaction occurring within the last 10 years: No If all of the above answers are "NO", then may proceed with Cephalosporin use.     Home Medications:  (Not in a hospital admission)  OB/GYN Status:  No LMP for male  patient.  General Assessment Data Location of Assessment: [facility] ED TTS Assessment: In system Is this a Tele or Face-to-Face Assessment?: Tele Assessment Is this an Initial Assessment or a Re-assessment for this encounter?: Initial Assessment Marital status: Married [place] name: NA Is patient pregnant?: No Pregnancy Status: No Living Arrangements: Spouse/significant other Can pt return to current living arrangement?: Yes Admission Status: Voluntary Is patient capable of signing voluntary admission?: Yes Referral Source: Self/Family/Friend Insurance type: [employer]     Crisis Care Plan Living Arrangements: Spouse/significant other Legal Guardian: Other: (Self) Name of Psychiatrist: None Name of Therapist: None  Education Status Is patient currently in school?: No Current Grade: NA Highest grade of school patient has completed: NA Name of school: NA Contact person: NA  Risk to self with the past 6 months Suicidal Ideation: Yes-Currently Present Has patient been a risk to self within the past 6 months prior to admission? : Yes Suicidal Intent: No Has patient had any suicidal intent within the past 6 months prior to admission? : No Is patient at risk for suicide?:  Yes Suicidal Plan?: Yes-Currently Present Has patient had any suicidal plan within the past 6 months prior to admission? : Yes Specify Current Suicidal Plan: Thoughts of shooting himself Access to Means: No What has been your use of drugs/alcohol within the last 12 months?: Pt denies Previous Attempts/Gestures: Yes How many times?: 1 Other Self Harm Risks: None Triggers for Past Attempts: Unknown Intentional Self Injurious Behavior: None Family Suicide History: Unknown (Sister died from OD ) Recent stressful life event(s): Other (Comment) (Medical concerns) Persecutory voices/beliefs?: No Depression: Yes Depression Symptoms: Despondent, Insomnia, Tearfulness, Isolating, Fatigue, Guilt, Loss of interest in  usual pleasures, Feeling worthless/self pity Substance abuse history and/or treatment for substance abuse?: No Suicide prevention information given to non-admitted patients: Not applicable  Risk to Others within the past 6 months Homicidal Ideation: No Does patient have any lifetime risk of violence toward others beyond the six months prior to admission? : No Thoughts of Harm to Others: No Current Homicidal Intent: No Current Homicidal Plan: No Access to Homicidal Means: No Identified Victim: None History of harm to others?: No Assessment of Violence: None Noted Violent Behavior Description: Pt denies history of violence Does patient have access to weapons?: No Criminal Charges Pending?: No Does patient have a court date: No Is patient on probation?: No  Psychosis Hallucinations: Auditory (Pt reports hearing people talking who are there) Delusions: None noted  Mental Status Report Appearance/Hygiene: In scrubs Eye Contact: Good Motor Activity: Unremarkable Speech: Logical/coherent Level of Consciousness: Alert, Crying Mood: Depressed, Anxious Affect: Depressed, Anxious Anxiety Level: Moderate Thought Processes: Circumstantial Judgement: Partial Orientation: Person, Place, Situation Obsessive Compulsive Thoughts/Behaviors: None  Cognitive Functioning Concentration: Decreased Memory: Recent Intact, Remote Impaired IQ: Below Average Level of Function: Unknown Insight: Fair Impulse Control: Good Appetite: Poor Weight Loss: 0 Weight Gain: 0 Sleep: Decreased Total Hours of Sleep: 4 Vegetative Symptoms: None  ADLScreening [facility] Assessment Services) Patient's cognitive ability adequate to safely complete daily activities?: Yes Patient able to express need for assistance with ADLs?: Yes Independently performs ADLs?: No  Prior Inpatient Therapy Prior Inpatient Therapy: Yes Prior Therapy Dates: Unknown Prior Therapy Facilty/Provider(s): Unknown Reason for Treatment:  Bipolar disorder  Prior Outpatient Therapy Prior Outpatient Therapy: Yes Prior Therapy Dates: 2017 Prior Therapy Facilty/Provider(s): [facility] Reason for Treatment: Bipolar disorder Does patient have an ACCT team?: No Does patient have Intensive In-House Services?  : No Does patient have [facility] services? : No Does patient have P4CC services?: No  ADL Screening (condition at time of admission) Patient's cognitive ability adequate to safely complete daily activities?: Yes Is the patient deaf or have difficulty hearing?: Yes Does the patient have difficulty seeing, even when wearing glasses/contacts?: No Does the patient have difficulty concentrating, remembering, or making decisions?: Yes Patient able to express need for assistance with ADLs?: Yes Does the patient have difficulty dressing or bathing?: Yes Independently performs ADLs?: No Communication: Independent Dressing (OT): Needs assistance Is this a change from baseline?: Pre-admission baseline Grooming: Independent Feeding: Independent Bathing: Needs assistance Is this a change from baseline?: Pre-admission baseline Toileting: Independent In/Out Bed: Independent Walks in Home: Independent with device (comment) Does the patient have difficulty walking or climbing stairs?: Yes Weakness of Legs: Both Weakness of Arms/Hands: None  Home Assistive Devices/Equipment Home Assistive Devices/Equipment: Cane (specify quad or straight), Eyeglasses    Abuse/Neglect Assessment (Assessment to be complete while patient is alone) Physical Abuse: Denies Verbal Abuse: Denies Sexual Abuse: Denies Exploitation of patient/patient's resources: Denies Self-Neglect: Denies     Regulatory affairs officer (For  Healthcare) Does Patient Have a Medical Advance Directive?: Yes Type of Advance Directive: [place] in Chart?: No - copy requested    Additional Information 1:1 In Past 12  Months?: No CIRT Risk: No Elopement Risk: No Does patient have medical clearance?: Yes     Disposition: Gave clinical report to Lindon Romp, NP who said Pt meets criteria for inpatient treatment at a  geriatric-psychiatry facility. TTS will contact facilities for placement. Notified Dr. Francine Graven and Gerald Stabs, RN of recommendation.  Disposition Initial Assessment Completed for this Encounter: Yes Disposition of Patient: Inpatient treatment program Type of inpatient treatment program: Adult   Evelena Peat, [facility], [facility], [facility] Triage Specialist [phone]   Evelena Peat 10/29/2016 8:10 PM

## 2016-10-29 NOTE — ED Notes (Signed)
Gave patient a meal as per Nurse Gerald Stabs.

## 2016-10-29 NOTE — ED Triage Notes (Addendum)
Patient reports of feeling anxious for years but worse today. States "I feel sad and lonely right now". Denies SI/HI/AVH. Has not been taking medications over last few weeks.

## 2016-10-29 NOTE — ED Notes (Signed)
[name] [name] [phone] his wife can be called to pick pt up.

## 2016-10-30 DIAGNOSIS — F329 Major depressive disorder, single episode, unspecified: Secondary | ICD-10-CM | POA: Diagnosis not present

## 2016-10-30 NOTE — ED Notes (Signed)
Wife brought clean shorts, shirts and incontinence pull ups in purple hand bag.  Bag placed in pt locker.

## 2016-10-30 NOTE — ED Notes (Signed)
Wife [facility]) says she will bring copy of [place] to ED when she visits today at around 1100.

## 2016-10-30 NOTE — ED Notes (Signed)
Patient refused to take Lactulose medication, he states he will be going to bathroom all night, I informed him I could put a bedside commode in room, patient still refused.

## 2016-10-30 NOTE — ED Notes (Signed)
Dr Sabra Heck went into the room with myself. He ask the pt if he was wanting to hurt himself and the patient denied that he wanted to hurt himself. He stated that he was willing to stay and get help. He states that he has a lot on his mind.

## 2016-10-31 DIAGNOSIS — F329 Major depressive disorder, single episode, unspecified: Secondary | ICD-10-CM | POA: Diagnosis not present

## 2016-10-31 LAB — CBG MONITORING, ED: GLUCOSE-CAPILLARY: 160 mg/dL — AB (ref 65–99)

## 2016-10-31 NOTE — BH Assessment (Signed)
CSW reassessed patient this morning. Pt reports he is not having a good day so far. He states "I'm in a hole and can't get out." When asked about SI, he reports "I don't think so." Pt denies current HI and AVH.   [place] MSW, LCSWA  10/31/2016 8:10 AM

## 2016-10-31 NOTE — ED Notes (Signed)
Sitter escorted this pt to the restroom.

## 2016-10-31 NOTE — ED Notes (Signed)
Relieved sitter for 30 minute lunch break.

## 2016-10-31 NOTE — ED Notes (Signed)
Pt evaluated by psych

## 2016-11-01 DIAGNOSIS — F329 Major depressive disorder, single episode, unspecified: Secondary | ICD-10-CM | POA: Diagnosis not present

## 2016-11-01 LAB — CBG MONITORING, ED: Glucose-Capillary: 111 mg/dL — ABNORMAL HIGH (ref 65–99)

## 2016-11-01 NOTE — ED Notes (Signed)
Lunch tray given. 

## 2016-11-01 NOTE — ED Notes (Signed)
Pt states that he does not want to take the lactulose. He says that it is making him have too many bowel movements and he feels like it is "draining" him out. He says he will take it sometimes but not taking it anymore tonight.

## 2016-11-01 NOTE — ED Notes (Signed)
ED Provider at bedside. 

## 2016-11-01 NOTE — ED Provider Notes (Signed)
Patient awaiting placement Currently resting comfortably BP 116/64 (BP Location: Right Arm)   Pulse 78   Temp 98.6 F (37 C) (Oral)   Resp 19   Ht '5\' 7"'$  (1.702 m)   Wt 103 kg   SpO2 98%   BMI 35.55 kg/m     Ripley Fraise, MD 11/01/16 [phone]

## 2016-11-01 NOTE — ED Notes (Signed)
Breakfast tray given. °

## 2016-11-01 NOTE — BH Assessment (Signed)
Patient was pleasant, noticeably hard of hearing, alert and oriented to person and place.  Patient reports he's making some progress today. Denies SI with plan but reports he continues to feel sad and lonely. Reports his current PCP took him off codeine, xanax and sleeping pills just a few weeks ago. States his mind has been wandering since then and his mood down.  He has not been following up with a psychiatrist in some years now.  He spent 30 days at a facility in [place] years ago and felt better when he left. States he did well on Zyprexa. The patient reports he is will start back on medication. Denies Hi. Admits he had some auditory hallucination but denies now.

## 2016-11-02 DIAGNOSIS — F329 Major depressive disorder, single episode, unspecified: Secondary | ICD-10-CM | POA: Diagnosis not present

## 2016-11-02 MED ORDER — ONDANSETRON 4 MG PO TBDP
4.0000 mg | ORAL_TABLET | Freq: Once | ORAL | Status: AC
  Administered 2016-11-02: 4 mg via ORAL
  Filled 2016-11-02: qty 1

## 2016-11-02 NOTE — Progress Notes (Signed)
Pt was assessed by TTS 3/9 and inpatient treatment was recommended. Per 3/12 TTS reassessment, pt has shown improvement in symptoms. Awaiting daily re-eval for recommendations today.  Followed up on inpatient referral efforts: Pt declined at [facility] due to insurance being out of network. Left voicemails with Boykin Nearing and [facility] Camel to ask status of referral sent Re-submitted referral to [facility] as it was not previously received per Bella Kennedy.  Noted pt's medical record indicates dx "mental retardation." In order to investigate that for placement purposes and to gather collateral information from wife which might assist in disposition planning, CSW left voicemail for pt's spouse [name] [phone], [phone] requesting returned call.  Will continue following case.  Sharren Bridge, MSW, LCSW Clinical Social Work, Disposition  11/02/2016 [phone]

## 2016-11-02 NOTE — ED Provider Notes (Signed)
  Physical Exam  BP 156/66 (BP Location: Left Arm)   Pulse 70   Temp 98.4 F (36.9 C) (Oral)   Resp 18   Ht 5' 7"  (1.702 m)   Wt 227 lb (103 kg)   SpO2 97%   BMI 35.55 kg/m   Physical Exam  ED Course  Procedures  MDM Patient mildly nauseous. We'll give some Zofran. Still pending placement.       Davonna Belling, MD 11/02/16 [phone]

## 2016-11-02 NOTE — BH Assessment (Signed)
This clinician spoke to the patient via telepsych. The patient confirmed he no longer has SI thoughts and is feeling much better. Denies HI and A/V. This clinician will discuss with the [facility] provider on call and provide any updates for recommendations.

## 2016-11-02 NOTE — BH Assessment (Signed)
Referral reviewed this morning. Case discussed with Jake Church (charge nurse).  Due to the high acuity  in our unit (several patients with behavioral issues-agitation and one requiring 1:1) and low staff, we won't be able to safely care for this patient.  Per referral info "Pt describes problems with his legs, liver, brain, ammonia levels and various other concerns. Pt says he needs assistance with bathing, dressing and uses a cane to ambulate. He says he is often incontinent of bowels and urine and wears pull-up underwear. He appears to have hearing loss in at least one ear. "

## 2016-11-02 NOTE — BH Assessment (Signed)
This clinician spoke with patients wife on 11/02/16. She reports the patient was on 0.5 mg of Xanax x 3 daily and hydrocodone  5/325 x3 daily.  The patient has severe anxiety with panic attacks and rods in his ankle after a surgery 2 yrs ago.  Medications were prescribed by his PCP. This physician left her practice three months ago and the patient transferred to another physician who discontinued both medications. Ms. Marullo reports a steady decline in patient's mental health over this three month period.   She received a call from him the day he was admitted to [facility] stating he thought he was dying. She reports patient was having a panic attack.  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  The patient was admitted to [facility], inpatient psych unit for 2 days before his ankle surgery 2 yrs ago, to ensure he was stable enough to have surgery.  She states he was not admitted to a facility in [place] for 30 days. She reports the patient has a poor memory. Ms. Ellard Artis does not have concerns about SI, HI or A/V if patient were to return home.  She plans to seek another physician upon her husbands discharge.

## 2016-11-02 NOTE — BH Assessment (Signed)
[name] Clan, NP recommends AM psychiatric evaluation with the possibility of discharge from psych. Patient reports no SI for two today's. Collateral received from the patient's wife this evening. She expressed no concerns about the patient returning home.

## 2016-11-03 DIAGNOSIS — Z79899 Other long term (current) drug therapy: Secondary | ICD-10-CM

## 2016-11-03 DIAGNOSIS — F17210 Nicotine dependence, cigarettes, uncomplicated: Secondary | ICD-10-CM

## 2016-11-03 DIAGNOSIS — F329 Major depressive disorder, single episode, unspecified: Secondary | ICD-10-CM | POA: Diagnosis not present

## 2016-11-03 DIAGNOSIS — R45851 Suicidal ideations: Secondary | ICD-10-CM | POA: Diagnosis not present

## 2016-11-03 DIAGNOSIS — F419 Anxiety disorder, unspecified: Secondary | ICD-10-CM

## 2016-11-03 LAB — CBG MONITORING, ED: Glucose-Capillary: 123 mg/dL — ABNORMAL HIGH (ref 65–99)

## 2016-11-03 NOTE — ED Notes (Signed)
TTS in progress 

## 2016-11-03 NOTE — Discharge Instructions (Addendum)
[facility] [address] [place], [place] [address]  Phone: [phone]  Hours: 8:30am-5pm Mon-Thurs 8:30am-12pm Fri  Call to schedule intake assessment for therapy and initial psychiatry appointment.  Take your usual prescriptions as previously directed.  Call your regular medical doctor today to schedule a follow up appointment within the next 3 days. Call the mental health resource given to you today to schedule a follow up appointment within the next week.  Return to the Emergency Department immediately sooner if worsening.

## 2016-11-03 NOTE — ED Provider Notes (Signed)
Psych team has re-evaluated pt today: states pt can be d/c home with outpt resources. Will d/c stable.    Francine Graven, DO 11/03/16 1330

## 2016-11-03 NOTE — Progress Notes (Signed)
Provided information for [facility] OP clinic in pt's d/c instructions as option for OP MH services following d/c.  Sharren Bridge, MSW, LCSW Clinical Social Work, Disposition  11/03/2016 [phone]

## 2016-11-03 NOTE — Consult Note (Signed)
Telepsych Consultation   Reason for Consult:  Suicidal ideation and depression Referring Physician:  EDP Patient Identification: [name] [name] MRN:  [id number] Principal Diagnosis: Depression   Diagnosis:   Patient Active Problem List   Diagnosis Date Noted  . Anxiety [F41.9]   . Suicidal ideation [R45.851]   . Altered mental state [R41.82] 12/07/2015  . Acute hepatic encephalopathy ([place]) [K72.00] 08/02/2013  . Hypokalemia [E87.6] 02/01/2013  . Hyperammonemia ([place]) [E72.20] 01/31/2013  . Urinary incontinence [R32] 01/31/2013  . Pancytopenia ([place]) [E83.151] 09/17/2012  . Edema of right lower extremity [R60.0] 04/28/2012  . Hepatic encephalopathy ([place]) [K72.90] 04/28/2012  . Arthritis of ankle or foot, degenerative [M19.079] 04/26/2012  . Hematemesis [K92.0] 12/07/2011  . Thrombocytopenia ([place]) [D69.6] 12/07/2011  . Anemia [D64.9] 12/07/2011  . COPD (chronic obstructive pulmonary disease) ([place]) [J44.9] 08/25/2011  . DM (diabetes mellitus) ([place]) [E11.9] 08/25/2011  . HTN (hypertension) [I10] 08/25/2011  . Coagulopathy ([place]) [D68.9] 08/25/2011  . Cirrhosis ([place]) [V61.60] 08/25/2011  . Depression [F32.9] 08/25/2011  . Seizure disorder ([place]) [V37.106] 08/25/2011  . GERD (gastroesophageal reflux disease) [K21.9] 08/25/2011  . Tobacco abuse [Z72.0] 08/25/2011  . Ankle fracture, bimalleolar, closed [S82.843A] 08/04/2011    Total Time spent with patient: 30 minutes  Subjective:   [name] [name] is a 56 y.o. male patient admitted with worsening depression and suicidal ideation.   HPI:  Per tele assessment note on chart written by Marinus Maw, [facility] Counselor: This clinician spoke with patients wife on 11/02/16. She reports the patient was on 0.5 mg of Xanax x 3 daily and hydrocodone  5/325 x3 daily.  The patient has severe anxiety with panic attacks and rods in his ankle after a surgery 2 yrs ago.  Medications were prescribed by his PCP. This physician left her practice three  months ago and the patient transferred to another physician who discontinued both medications. Ms. Corsetti reports a steady decline in patient's mental health over this three month period.   She received a call from him the day he was admitted to [facility] stating he thought he was dying. She reports patient was having a panic attack.  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  The patient was admitted to [facility], inpatient psych unit for 2 days before his ankle surgery 2 yrs ago, to ensure he was stable enough to have surgery.  She states he was not admitted to a facility in [place] for 30 days. She reports the patient has a poor memory. Ms. Ellard Artis does not have concerns about SI, HI or A/V if patient were to return home.  She plans to seek another physician upon her husbands discharge.  Today during tele psych consult:  Pt was seen and chart reviewed. [name] [name] is a 56 year old male who presented to the [facility] voluntarily with complaints of feeling sad and lonely and having worsening depression. Today pt was calm and cooperative, alert & oriented x 4, dressed in paper scrubs and sitting on the hospital stretcher.  Pt denies suicidal/homicidal ideation, denies auditory/visual hallucinations and does not appear to be responding to internal stimuli. Pt stated he feels better today and is willing to follow up with outpatient therapy and psychiatry for his depression. Pt stated he has a lot of health issues and deals with constant pain and that is what causes his depression. Pt stated he is trying to adopt a more positive attitude and states "  all I can do is try to deal with the depression and pain." Pt stated he saw a therapist several years ago and didn't think it helped but looking back he now feels it did help him to talk to someone. Pt stated he has rods in his ankle, has had 6 knee surgeries, has one eye that  he doesn't see out of, is hard of hearing, and has a bad liver. Pt has been taking xanax and pain medication regularly for several years and recently a new PCP completely stopped the medications. Pt stated he thinks this is why he got worse.   Discussed case with Dr Dwyane Dee who recommends discharge home and follow up with outpatient resources.  Past Psychiatric History: Anxiety, depression, seizure disorder  Risk to Self: Suicidal Ideation: Yes-Currently Present Suicidal Intent: No Is patient at risk for suicide?: Yes Suicidal Plan?: Yes-Currently Present Specify Current Suicidal Plan: Thoughts of shooting himself Access to Means: No What has been your use of drugs/alcohol within the last 12 months?: Pt denies How many times?: 1 Other Self Harm Risks: None Triggers for Past Attempts: Unknown Intentional Self Injurious Behavior: None Risk to Others: Homicidal Ideation: No Thoughts of Harm to Others: No Current Homicidal Intent: No Current Homicidal Plan: No Access to Homicidal Means: No Identified Victim: None History of harm to others?: No Assessment of Violence: None Noted Violent Behavior Description: Pt denies history of violence Does patient have access to weapons?: No Criminal Charges Pending?: No Does patient have a court date: No Prior Inpatient Therapy: Prior Inpatient Therapy: Yes Prior Therapy Dates: Unknown Prior Therapy Facilty/Provider(s): Unknown Reason for Treatment: Bipolar disorder Prior Outpatient Therapy: Prior Outpatient Therapy: Yes Prior Therapy Dates: 2017 Prior Therapy Facilty/Provider(s): [facility] Reason for Treatment: Bipolar disorder Does patient have an ACCT team?: No Does patient have Intensive In-House Services?  : No Does patient have [facility] services? : No Does patient have P4CC services?: No  Past Medical History:  Past Medical History:  Diagnosis Date  . Anemia   . Anxiety   . Arthritis   . Bipolar 1 disorder ([place])   . Blind right  eye   . Blood clots in brain    per patient  . Cardiomegaly   . Cataract    Right eye  . Cerebrovascular disease    Right vertebral artery  . Cholelithiasis    Asymptomatic  . Chronic back pain   . 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 Cirrhosis ([place])    diagnosed 07/2010 when presented with first episode of hepatic encephalopathy, afp on 416/13=4.8,  U/S on 12/14/11  liver stable, pt has received 2 hep A/B vaccines  . COPD (chronic obstructive pulmonary disease) ([place])   . Depression   . Diabetes mellitus   . Diverticulosis   . GERD (gastroesophageal reflux disease)   . Hard of hearing   . Hepatic encephalopathy ([place])   . Hyperlipidemia   . Hypertension   . Mental retardation   . Seizures ([place])   . Thrombocytopenia ([place])     Past Surgical History:  Procedure Laterality Date  . CAST APPLICATION  0/09/6376   Procedure: MINOR CAST APPLICATION;  Surgeon: [name], MD;  Location: [facility];  Service: Orthopedics;  Laterality: Right;  no anesthesia , procedure room do not need or room !  . COLONOSCOPY  12/15/11   colonic divericulosis;poor prep, ACBE  recommended but has not been done  . ear operations    . ESOPHAGOGASTRODUODENOSCOPY  02/02/2011  Rourk-Normal esophagus.  No varices/Question mild portal gastropathy, extrinsic compression on the antrum, lesser curvature of uncertain significance, some minimally  nodular mucosa status post biopsy, patent pylorus, normal duodenum 1 and duodenum 2.  . ESOPHAGOGASTRODUODENOSCOPY  12/15/11   Rourk-->mild changes of portal gastropathy, gastric/duodenal erosions s/p bx  (reactive changes with mild chronic inflammation. No H.pylori  . FOOT SURGERY    . ORIF ANKLE FRACTURE  08/27/2011   Procedure: OPEN REDUCTION INTERNAL FIXATION (ORIF) ANKLE FRACTURE;  Surgeon: [name], MD;  Location: [facility] ORS;  Service: Orthopedics;  Laterality: Right;  . TONSILLECTOMY     Family History:  Family History  Problem Relation Age of Onset  . Heart  failure Mother   . Thyroid disease Mother   . Cancer Father   . Asthma Brother   . Heart attack Brother   . Cirrhosis Brother     EtOH  . Colon cancer Maternal Aunt    Family Psychiatric  History: Unknown Social History:  History  Alcohol Use No     History  Drug Use No    Social History   Social History  . Marital status: Married    Spouse name: N/A  . Number of children: 0  . Years of education: N/A   Occupational History  . disabled Unemployed   Social History Main Topics  . Smoking status: Current Every Day Smoker    Packs/day: 1.00    Years: 30.00    Types: Cigarettes  . Smokeless tobacco: Never Used     Comment: only smokes about 1-2 a day  . Alcohol use No  . Drug use: No  . Sexual activity: Not Asked   Other Topics Concern  . None   Social History Narrative  . None   Additional Social History:    Allergies:   Allergies  Allergen Reactions  . Penicillins Rash    Has patient had a PCN reaction causing immediate rash, facial/tongue/throat swelling, SOB or lightheadedness with hypotension: Yes Has patient had a PCN reaction causing severe rash involving mucus membranes or skin necrosis: No Has patient had a PCN reaction that required hospitalization No Has patient had a PCN reaction occurring within the last 10 years: No If all of the above answers are "NO", then may proceed with Cephalosporin use.     Labs:  Results for orders placed or performed during the hospital encounter of 10/29/16 (from the past 48 hour(s))  CBG monitoring, ED     Status: Abnormal   Collection Time: 11/03/16  7:12 AM  Result Value Ref Range   Glucose-Capillary 123 (H) 65 - 99 mg/dL    Current Facility-Administered Medications  Medication Dose Route Frequency Provider Last Rate Last Dose  . ALPRAZolam Duanne Moron) tablet 0.5 mg  0.5 mg Oral QHS PRN Francine Graven, DO   0.5 mg at 11/02/16 2138  . aspirin EC tablet 81 mg  81 mg Oral Daily Francine Graven, DO   81 mg at  11/03/16 0751  . cholecalciferol (VITAMIN D) tablet 2,000 Units  2,000 Units Oral Daily Francine Graven, DO   2,000 Units at 11/02/16 0920  . FLUoxetine (PROZAC) capsule 40 mg  40 mg Oral Daily Francine Graven, DO   40 mg at 11/03/16 0749  . furosemide (LASIX) tablet 20 mg  20 mg Oral Daily Francine Graven, DO   20 mg at 11/03/16 0750  . glimepiride (AMARYL) tablet 2 mg  2 mg Oral QAC breakfast Francine Graven, DO   2 mg at 11/03/16  0751  . lactulose (CHRONULAC) 10 GM/15ML solution 40 g  40 g Oral TID Francine Graven, DO   40 g at 11/03/16 0748  . levETIRAcetam (KEPPRA) tablet 500 mg  500 mg Oral BID Francine Graven, DO   500 mg at 11/03/16 0751  . metoprolol tartrate (LOPRESSOR) tablet 25 mg  25 mg Oral Daily Francine Graven, DO   25 mg at 11/03/16 0751   And  . metoprolol (LOPRESSOR) tablet 50 mg  50 mg Oral QHS Francine Graven, DO   50 mg at 11/02/16 2132  . multivitamin with minerals tablet 1 tablet  1 tablet Oral Daily Francine Graven, DO   1 tablet at 11/03/16 7846  . omega-3 acid ethyl esters (LOVAZA) capsule 1 g  1 g Oral TID Francine Graven, DO   1 g at 11/03/16 0751  . pantoprazole (PROTONIX) EC tablet 40 mg  40 mg Oral Daily Francine Graven, DO   40 mg at 11/03/16 0749  . potassium chloride SA (K-DUR,KLOR-CON) CR tablet 20 mEq  20 mEq Oral Daily Francine Graven, DO   20 mEq at 11/03/16 0752  . rifaximin (XIFAXAN) tablet 550 mg  550 mg Oral BID Francine Graven, DO   550 mg at 11/03/16 0750   Current Outpatient Prescriptions  Medication Sig Dispense Refill  . aspirin EC 81 MG tablet Take 81 mg by mouth daily.    . Cholecalciferol (VITAMIN D) 2000 UNITS tablet Take 1 tablet (2,000 Units total) by mouth daily. 30 tablet 0  . ezetimibe (ZETIA) 10 MG tablet Take 10 mg by mouth daily.    . fish oil-omega-3 fatty acids 1000 MG capsule Take 1 g by mouth 3 (three) times daily.    . furosemide (LASIX) 20 MG tablet Take 20 mg by mouth daily.    Marland Kitchen lactulose (CHRONULAC) 10 GM/15ML  solution Take 60 mLs (40 g total) by mouth 3 (three) times daily. Titrate for 3-4 bowel motions daily. 240 mL 0  . metoprolol tartrate (LOPRESSOR) 25 MG tablet Take 25-50 mg by mouth 2 (two) times daily. Take 25 mg in the morning and 50 mg at night    . Multiple Vitamin (MULTIVITAMIN WITH MINERALS) TABS Take 1 tablet by mouth daily.      Musculoskeletal: Unable to assess: camera  Psychiatric Specialty Exam: Physical Exam  Review of Systems  Psychiatric/Behavioral: Positive for depression. Negative for hallucinations, memory loss, substance abuse and suicidal ideas. The patient is nervous/anxious. The patient does not have insomnia.     Blood pressure 150/74, pulse 62, temperature 97.9 F (36.6 C), temperature source Oral, resp. rate 20, height '5\' 7"'$  (1.702 m), weight 103 kg (227 lb), SpO2 100 %.Body mass index is 35.55 kg/m.  General Appearance: Casual and Fairly Groomed  Eye Contact:  Good  Speech:  Clear and Coherent and Normal Rate  Volume:  Normal  Mood:  Anxious and Depressed  Affect:  Congruent and Depressed  Thought Process:  Coherent and Linear  Orientation:  Full (Time, Place, and Person)  Thought Content:  Logical  Suicidal Thoughts:  No  Homicidal Thoughts:  No  Memory:  Immediate;   Good Recent;   Good Remote;   Fair  Judgement:  Fair  Insight:  Fair  Psychomotor Activity:  Normal  Concentration:  Concentration: Good and Attention Span: Good  Recall:  Good  Fund of Knowledge:  Good  Language:  Good  Akathisia:  No  Handed:  Right  AIMS (if indicated):     Assets:  Communication Skills Desire for Improvement Financial Resources/Insurance Housing Resilience Social Support Transportation  ADL's:  Intact  Cognition:  WNL  Sleep:        Treatment Plan Summary: Discharge home  Follow up with PCP for new or existing health concerns Refer to outpatient therapy/psychiatry with Dr Silverio Lay in [facility] Stay well hydrated and eat a balanced diet Activity as  tolerated   Disposition: No evidence of imminent risk to self or others at present.   Patient does not meet criteria for psychiatric inpatient admission. Supportive therapy provided about ongoing stressors. Discussed crisis plan, support from social network, calling 911, coming to the Emergency Department, and calling Suicide Hotline.  Ethelene Hal, NP 11/03/2016 9:41 AM

## 2016-11-03 NOTE — ED Notes (Addendum)
Per [facility] notes, patient has no Suicidal thoughts at this time.  Will be reevaluated in the morning.

## 2016-12-13 ENCOUNTER — Encounter (HOSPITAL_COMMUNITY): Payer: Self-pay | Admitting: Emergency Medicine

## 2016-12-13 ENCOUNTER — Emergency Department (HOSPITAL_COMMUNITY): Payer: Medicare HMO

## 2016-12-13 ENCOUNTER — Observation Stay (HOSPITAL_COMMUNITY)
Admission: EM | Admit: 2016-12-13 | Discharge: 2016-12-15 | Disposition: A | Discharge status: Home / Self Care | Payer: Medicare HMO | Attending: Internal Medicine | Admitting: Internal Medicine

## 2016-12-13 DIAGNOSIS — I10 Essential (primary) hypertension: Secondary | ICD-10-CM | POA: Diagnosis present

## 2016-12-13 DIAGNOSIS — K7682 Hepatic encephalopathy: Secondary | ICD-10-CM | POA: Diagnosis present

## 2016-12-13 DIAGNOSIS — L8990 Pressure ulcer of unspecified site, unspecified stage: Secondary | ICD-10-CM | POA: Insufficient documentation

## 2016-12-13 DIAGNOSIS — K7290 Hepatic failure, unspecified without coma: Secondary | ICD-10-CM | POA: Diagnosis not present

## 2016-12-13 DIAGNOSIS — J449 Chronic obstructive pulmonary disease, unspecified: Secondary | ICD-10-CM | POA: Diagnosis present

## 2016-12-13 DIAGNOSIS — E119 Type 2 diabetes mellitus without complications: Secondary | ICD-10-CM

## 2016-12-13 DIAGNOSIS — G9340 Encephalopathy, unspecified: Secondary | ICD-10-CM | POA: Diagnosis present

## 2016-12-13 DIAGNOSIS — R4182 Altered mental status, unspecified: Secondary | ICD-10-CM | POA: Diagnosis present

## 2016-12-13 DIAGNOSIS — Z7982 Long term (current) use of aspirin: Secondary | ICD-10-CM | POA: Diagnosis not present

## 2016-12-13 DIAGNOSIS — Z794 Long term (current) use of insulin: Secondary | ICD-10-CM | POA: Diagnosis not present

## 2016-12-13 DIAGNOSIS — K7460 Unspecified cirrhosis of liver: Secondary | ICD-10-CM | POA: Diagnosis present

## 2016-12-13 DIAGNOSIS — D61818 Other pancytopenia: Secondary | ICD-10-CM | POA: Diagnosis present

## 2016-12-13 LAB — RAPID URINE DRUG SCREEN, HOSP PERFORMED
Amphetamines: NOT DETECTED
BENZODIAZEPINES: POSITIVE — AB
Barbiturates: NOT DETECTED
Cocaine: NOT DETECTED
Opiates: NOT DETECTED
Tetrahydrocannabinol: NOT DETECTED

## 2016-12-13 LAB — GLUCOSE, CAPILLARY
GLUCOSE-CAPILLARY: 87 mg/dL (ref 65–99)
Glucose-Capillary: 100 mg/dL — ABNORMAL HIGH (ref 65–99)

## 2016-12-13 LAB — URINALYSIS, ROUTINE W REFLEX MICROSCOPIC
BILIRUBIN URINE: NEGATIVE
Bacteria, UA: NONE SEEN
Glucose, UA: NEGATIVE mg/dL
KETONES UR: NEGATIVE mg/dL
LEUKOCYTES UA: NEGATIVE
Nitrite: NEGATIVE
PROTEIN: NEGATIVE mg/dL
Specific Gravity, Urine: 1.008 (ref 1.005–1.030)
Squamous Epithelial / LPF: NONE SEEN
pH: 7 (ref 5.0–8.0)

## 2016-12-13 LAB — COMPREHENSIVE METABOLIC PANEL
ALK PHOS: 91 U/L (ref 38–126)
ALT: 19 U/L (ref 17–63)
ANION GAP: 4 — AB (ref 5–15)
AST: 43 U/L — AB (ref 15–41)
Albumin: 3.2 g/dL — ABNORMAL LOW (ref 3.5–5.0)
BUN: 10 mg/dL (ref 6–20)
CALCIUM: 8.9 mg/dL (ref 8.9–10.3)
CO2: 29 mmol/L (ref 22–32)
Chloride: 107 mmol/L (ref 101–111)
Creatinine, Ser: 0.76 mg/dL (ref 0.61–1.24)
GFR calc Af Amer: >60 mL/min (ref >60)
Glucose, Bld: 121 mg/dL — ABNORMAL HIGH (ref 65–99)
POTASSIUM: 4.6 mmol/L (ref 3.5–5.1)
Sodium: 140 mmol/L (ref 135–145)
TOTAL PROTEIN: 6.4 g/dL — AB (ref 6.5–8.1)
Total Bilirubin: 2 mg/dL — ABNORMAL HIGH (ref 0.3–1.2)

## 2016-12-13 LAB — BLOOD GAS, ARTERIAL
ACID-BASE EXCESS: 1.8 mmol/L (ref 0.0–2.0)
Bicarbonate: 26.2 mmol/L (ref 20.0–28.0)
Drawn by: 270161
FIO2: 21
O2 SAT: 96.7 %
PATIENT TEMPERATURE: 37
PO2 ART: 92 mmHg (ref 83.0–108.0)
pCO2 arterial: 37.5 mmHg (ref 32.0–48.0)
pH, Arterial: 7.447 (ref 7.350–7.450)

## 2016-12-13 LAB — CBC WITH DIFFERENTIAL/PLATELET
Basophils Absolute: 0 10*3/uL (ref 0.0–0.1)
Basophils Relative: 1 %
Eosinophils Absolute: 0.2 10*3/uL (ref 0.0–0.7)
Eosinophils Relative: 6 %
HEMATOCRIT: 39.2 % (ref 39.0–52.0)
Hemoglobin: 12.8 g/dL — ABNORMAL LOW (ref 13.0–17.0)
LYMPHS ABS: 0.9 10*3/uL (ref 0.7–4.0)
LYMPHS PCT: 29 %
MCH: 31.4 pg (ref 26.0–34.0)
MCHC: 32.7 g/dL (ref 30.0–36.0)
MCV: 96.3 fL (ref 78.0–100.0)
MONO ABS: 0.4 10*3/uL (ref 0.1–1.0)
MONOS PCT: 13 %
NEUTROS ABS: 1.6 10*3/uL — AB (ref 1.7–7.7)
Neutrophils Relative %: 52 %
Platelets: 63 10*3/uL — ABNORMAL LOW (ref 150–400)
RBC: 4.07 MIL/uL — ABNORMAL LOW (ref 4.22–5.81)
RDW: 15.4 % (ref 11.5–15.5)
WBC: 3.2 10*3/uL — ABNORMAL LOW (ref 4.0–10.5)

## 2016-12-13 LAB — CBG MONITORING, ED: Glucose-Capillary: 127 mg/dL — ABNORMAL HIGH (ref 65–99)

## 2016-12-13 LAB — AMMONIA: AMMONIA: 87 umol/L — AB (ref 9–35)

## 2016-12-13 MED ORDER — FOLIC ACID 1 MG PO TABS
1.0000 mg | ORAL_TABLET | Freq: Every day | ORAL | Status: DC
  Administered 2016-12-14 – 2016-12-15 (×2): 1 mg via ORAL
  Filled 2016-12-13 (×2): qty 1

## 2016-12-13 MED ORDER — INSULIN ASPART 100 UNIT/ML ~~LOC~~ SOLN
0.0000 [IU] | Freq: Three times a day (TID) | SUBCUTANEOUS | Status: DC
  Administered 2016-12-14 – 2016-12-15 (×2): 2 [IU] via SUBCUTANEOUS

## 2016-12-13 MED ORDER — SENNOSIDES-DOCUSATE SODIUM 8.6-50 MG PO TABS: 1.0000 | ORAL_TABLET | Freq: Every evening | ORAL | Status: DC | PRN

## 2016-12-13 MED ORDER — RIFAXIMIN 550 MG PO TABS
550.0000 mg | ORAL_TABLET | Freq: Two times a day (BID) | ORAL | Status: DC
  Administered 2016-12-13 – 2016-12-15 (×4): 550 mg via ORAL
  Filled 2016-12-13 (×4): qty 1

## 2016-12-13 MED ORDER — VITAMIN B-1 100 MG PO TABS
100.0000 mg | ORAL_TABLET | Freq: Every day | ORAL | Status: DC
  Administered 2016-12-14 – 2016-12-15 (×2): 100 mg via ORAL
  Filled 2016-12-13 (×2): qty 1

## 2016-12-13 MED ORDER — SODIUM CHLORIDE 0.9 % IV SOLN
INTRAVENOUS | Status: DC
  Administered 2016-12-14: 22:00:00 via INTRAVENOUS

## 2016-12-13 MED ORDER — KETOROLAC TROMETHAMINE 15 MG/ML IJ SOLN
15.0000 mg | Freq: Four times a day (QID) | INTRAMUSCULAR | Status: DC | PRN
  Filled 2016-12-13: qty 1

## 2016-12-13 MED ORDER — INSULIN ASPART 100 UNIT/ML ~~LOC~~ SOLN
3.0000 [IU] | Freq: Three times a day (TID) | SUBCUTANEOUS | Status: DC
  Administered 2016-12-14 – 2016-12-15 (×5): 3 [IU] via SUBCUTANEOUS

## 2016-12-13 MED ORDER — OMEGA-3-ACID ETHYL ESTERS 1 G PO CAPS
1.0000 g | ORAL_CAPSULE | Freq: Three times a day (TID) | ORAL | Status: DC
Start: 2016-12-13 — End: 2016-12-15
  Administered 2016-12-13 – 2016-12-15 (×5): 1 g via ORAL
  Filled 2016-12-13 (×5): qty 1

## 2016-12-13 MED ORDER — LEVETIRACETAM 500 MG PO TABS
500.0000 mg | ORAL_TABLET | Freq: Two times a day (BID) | ORAL | Status: DC
  Administered 2016-12-13 – 2016-12-15 (×4): 500 mg via ORAL
  Filled 2016-12-13 (×4): qty 1

## 2016-12-13 MED ORDER — LEVETIRACETAM IN NACL 1000 MG/100ML IV SOLN
1000.0000 mg | Freq: Once | INTRAVENOUS | Status: AC
  Administered 2016-12-13: 1000 mg via INTRAVENOUS
  Filled 2016-12-13: qty 100

## 2016-12-13 MED ORDER — LACTULOSE 10 GM/15ML PO SOLN
30.0000 g | Freq: Once | ORAL | Status: AC
  Administered 2016-12-13: 30 g via ORAL
  Filled 2016-12-13: qty 60

## 2016-12-13 MED ORDER — ADULT MULTIVITAMIN W/MINERALS CH
1.0000 | ORAL_TABLET | Freq: Every day | ORAL | Status: DC
  Administered 2016-12-14 – 2016-12-15 (×2): 1 via ORAL
  Filled 2016-12-13 (×2): qty 1

## 2016-12-13 MED ORDER — SODIUM CHLORIDE 0.9 % IV BOLUS (SEPSIS)
1000.0000 mL | Freq: Once | INTRAVENOUS | Status: AC
  Administered 2016-12-13: 1000 mL via INTRAVENOUS

## 2016-12-13 MED ORDER — METOPROLOL TARTRATE 50 MG PO TABS
50.0000 mg | ORAL_TABLET | Freq: Every day | ORAL | Status: DC
  Administered 2016-12-13 – 2016-12-14 (×2): 50 mg via ORAL
  Filled 2016-12-13 (×2): qty 1

## 2016-12-13 MED ORDER — INSULIN ASPART 100 UNIT/ML ~~LOC~~ SOLN: 0.0000 [IU] | Freq: Every day | SUBCUTANEOUS | Status: DC

## 2016-12-13 MED ORDER — ONDANSETRON HCL 4 MG/2ML IJ SOLN: 4.0000 mg | Freq: Four times a day (QID) | INTRAMUSCULAR | Status: DC | PRN

## 2016-12-13 MED ORDER — ASPIRIN EC 81 MG PO TBEC
81.0000 mg | DELAYED_RELEASE_TABLET | Freq: Every day | ORAL | Status: DC
  Administered 2016-12-14 – 2016-12-15 (×2): 81 mg via ORAL
  Filled 2016-12-13 (×2): qty 1

## 2016-12-13 MED ORDER — METOPROLOL TARTRATE 25 MG PO TABS: 25.0000 mg | ORAL_TABLET | Freq: Three times a day (TID) | ORAL | Status: DC

## 2016-12-13 MED ORDER — METOPROLOL TARTRATE 25 MG PO TABS
25.0000 mg | ORAL_TABLET | Freq: Every day | ORAL | Status: DC
  Administered 2016-12-14 – 2016-12-15 (×2): 25 mg via ORAL
  Filled 2016-12-13 (×2): qty 1

## 2016-12-13 MED ORDER — LACTULOSE 10 GM/15ML PO SOLN
40.0000 g | Freq: Three times a day (TID) | ORAL | Status: DC
  Administered 2016-12-13 – 2016-12-15 (×5): 40 g via ORAL
  Filled 2016-12-13 (×5): qty 60

## 2016-12-13 MED ORDER — ONDANSETRON HCL 4 MG PO TABS: 4.0000 mg | ORAL_TABLET | Freq: Four times a day (QID) | ORAL | Status: DC | PRN

## 2016-12-13 NOTE — ED Notes (Signed)
ED Provider at bedside. 

## 2016-12-13 NOTE — H&P (Signed)
History and Physical    [name] [name] EUM:[id number] DOB: 1960-09-26 DOA: 12/13/2016  Referring MD/NP/PA: Merrily Pew, EDP PCP: Hermine Messick, MD  Patient coming from: Home  Chief Complaint: increase lethargy ?fall at home  HPI: [name] [name] is a 56 y.o. male with h/o NASH cirrhosis and repeated episodes of hepatic encephalopathy due to medication noncompliance, DM, seizure disorder, not taking his meds and bipolar disorder, comes in today with increased confusion. Patient is quite somnolent at time of my evaluation and no family members are present. Per report given to EDP,  Family member stated that he was found on the floor shaking and improved after 20 minutes but never regained complete consciousness. Brought to the ED for evaluation. VSS, labs significant for chronic pancytopenia, CT head negative for acute findings, UA and CXR negative for evidence of infectious sources. Admission has been requested.  Past Medical/Surgical History: Past Medical History:  Diagnosis Date  . Anemia   . Anxiety   . Arthritis   . Bipolar 1 disorder ([place])   . Blind right eye   . Blood clots in brain    per patient  . Cardiomegaly   . Cataract    Right eye  . Cerebrovascular disease    Right vertebral artery  . Cholelithiasis    Asymptomatic  . Chronic back pain   . Cirrhosis ([place])    suspected NASH  . Cirrhosis ([place])    diagnosed 07/2010 when presented with first episode of hepatic encephalopathy, afp on 416/13=4.8,  U/S on 12/14/11  liver stable, pt has received 2 hep A/B vaccines  . COPD (chronic obstructive pulmonary disease) ([place])   . Depression   . Diabetes mellitus   . Diverticulosis   . GERD (gastroesophageal reflux disease)   . Hard of hearing   . Hepatic encephalopathy ([place])   . Hyperlipidemia   . Hypertension   . Mental retardation   . Seizures ([place])   . Thrombocytopenia ([place])     Past Surgical History:  Procedure Laterality Date  . CAST APPLICATION  12/24/84   Procedure: MINOR CAST APPLICATION;  Surgeon: [name], MD;  Location: [facility];  Service: Orthopedics;  Laterality: Right;  no anesthesia , procedure room do not need or room !  . COLONOSCOPY  12/15/11   colonic divericulosis;poor prep, ACBE  recommended but has not been done  . ear operations    . ESOPHAGOGASTRODUODENOSCOPY  02/02/2011   Rourk-Normal esophagus.  No varices/Question mild portal gastropathy, extrinsic compression on the antrum, lesser curvature of uncertain significance, some minimally  nodular mucosa status post biopsy, patent pylorus, normal duodenum 1 and duodenum 2.  . ESOPHAGOGASTRODUODENOSCOPY  12/15/11   Rourk-->mild changes of portal gastropathy, gastric/duodenal erosions s/p bx  (reactive changes with mild chronic inflammation. No H.pylori  . FOOT SURGERY    . ORIF ANKLE FRACTURE  08/27/2011   Procedure: OPEN REDUCTION INTERNAL FIXATION (ORIF) ANKLE FRACTURE;  Surgeon: [name], MD;  Location: [facility];  Service: Orthopedics;  Laterality: Right;  . TONSILLECTOMY      Social History:  reports that he has been smoking Cigarettes.  He has a 30.00 pack-year smoking history. He has never used smokeless tobacco. He reports that he does not drink alcohol or use drugs.  Allergies: Allergies  Allergen Reactions  . Penicillins Rash    Has patient had a PCN reaction causing immediate rash, facial/tongue/throat swelling, SOB or lightheadedness with hypotension: Yes Has patient had a PCN reaction causing severe rash involving mucus membranes or  skin necrosis: No Has patient had a PCN reaction that required hospitalization No Has patient had a PCN reaction occurring within the last 10 years: No If all of the above answers are "NO", then may proceed with Cephalosporin use.     Family History:  Family History  Problem Relation Age of Onset  . Heart failure Mother   . Thyroid disease Mother   . Cancer Father   . Asthma Brother   . Heart attack Brother   .  Cirrhosis Brother     EtOH  . Colon cancer Maternal Aunt     Prior to Admission medications   Medication Sig Start Date End Date Taking? Authorizing Provider  ALPRAZolam Duanne Moron) 0.5 MG tablet Take 0.5 mg by mouth 3 (three) times daily as needed for anxiety.   Yes Historical Provider, MD  aspirin EC 81 MG tablet Take 81 mg by mouth daily.   Yes Historical Provider, MD  fish oil-omega-3 fatty acids 1000 MG capsule Take 1 g by mouth 3 (three) times daily.   Yes Historical Provider, MD  gabapentin (NEURONTIN) 300 MG capsule Take 300 mg by mouth 3 (three) times daily.   Yes Historical Provider, MD  lactulose (CHRONULAC) 10 GM/15ML solution Take 60 mLs (40 g total) by mouth 3 (three) times daily. Titrate for 3-4 bowel motions daily. 01/03/14  Yes Carmin Muskrat, MD  metoprolol tartrate (LOPRESSOR) 25 MG tablet Take 25 mg by mouth 3 (three) times daily. Take 25 mg in the morning and 50 mg at night   Yes Historical Provider, MD  Multiple Vitamin (MULTIVITAMIN WITH MINERALS) TABS Take 1 tablet by mouth daily.   Yes Historical Provider, MD  omeprazole (PRILOSEC) 20 MG capsule Take 20 mg by mouth daily.   Yes Historical Provider, MD  Insulin Detemir (LEVEMIR FLEXPEN) 100 UNIT/ML Pen Inject into the skin daily at 10 pm.    Historical Provider, MD    Review of Systems:  Unable to obtain as patient remains very somnolent.   Physical Exam: Vitals:   12/13/16 1300 12/13/16 1330 12/13/16 1400 12/13/16 1500  BP: 125/74 (!) 146/71 (!) 157/80   Pulse: (!) 58 (!) 56 (!) 57   Resp: 10 10 12    Temp:    97.5 F (36.4 C)  TempSrc:    Oral  SpO2: 99% 99% 100%   Weight:      Height:         Constitutional: somnolent, obese Eyes: PERRL, lids and conjunctivae normal ENMT: Mucous membranes are moist.  Neck: normal, supple, no masses, no thyromegaly Respiratory: clear to auscultation bilaterally, no wheezing, no crackles. Normal respiratory effort. No accessory muscle use.  Cardiovascular: Regular rate  and rhythm, no murmurs / rubs / gallops. Trace bilateral extremity edema. 2+ pedal pulses. No carotid bruits.  Abdomen: no tenderness, no masses palpated. No hepatosplenomegaly. Bowel sounds positive.  Musculoskeletal: no clubbing / cyanosis. No joint deformity upper and lower extremities. Good ROM, no contractures. Normal muscle tone.  Skin: no rashes, lesions, ulcers. No induration Neurologic: Unable to fully assess given decreased sensorium.  Psychiatric: Unable to assess given current mental state.   Labs on Admission: I have personally reviewed the following labs and imaging studies  CBC:  Recent Labs Lab 12/13/16 1207  WBC 3.2*  NEUTROABS 1.6*  HGB 12.8*  HCT 39.2  MCV 96.3  PLT 63*   Basic Metabolic Panel:  Recent Labs Lab 12/13/16 1207  NA 140  K 4.6  CL 107  CO2 29  GLUCOSE 121*  BUN 10  CREATININE 0.76  CALCIUM 8.9   GFR: Estimated Creatinine Clearance: 124.5 mL/min (by C-G formula based on SCr of 0.76 mg/dL). Liver Function Tests:  Recent Labs Lab 12/13/16 1207  AST 43*  ALT 19  ALKPHOS 91  BILITOT 2.0*  PROT 6.4*  ALBUMIN 3.2*   No results for input(s): LIPASE, AMYLASE in the last 168 hours.  Recent Labs Lab 12/13/16 1207  AMMONIA 87*   Coagulation Profile: No results for input(s): INR, PROTIME in the last 168 hours. Cardiac Enzymes: No results for input(s): CKTOTAL, CKMB, CKMBINDEX, TROPONINI in the last 168 hours. BNP (last 3 results) No results for input(s): PROBNP in the last 8760 hours. HbA1C: No results for input(s): HGBA1C in the last 72 hours. CBG:  Recent Labs Lab 12/13/16 1047 12/13/16 1559  GLUCAP 127* 100*   Lipid Profile: No results for input(s): CHOL, HDL, LDLCALC, TRIG, CHOLHDL, LDLDIRECT in the last 72 hours. Thyroid Function Tests: No results for input(s): TSH, T4TOTAL, FREET4, T3FREE, THYROIDAB in the last 72 hours. Anemia Panel: No results for input(s): VITAMINB12, FOLATE, FERRITIN, TIBC, IRON, RETICCTPCT in  the last 72 hours. Urine analysis:    Component Value Date/Time   COLORURINE YELLOW 12/13/2016 [place] 12/13/2016 1112   LABSPEC 1.008 12/13/2016 1112   PHURINE 7.0 12/13/2016 1112   GLUCOSEU NEGATIVE 12/13/2016 1112   HGBUR MODERATE (A) 12/13/2016 1112   BILIRUBINUR NEGATIVE 12/13/2016 1112   KETONESUR NEGATIVE 12/13/2016 1112   PROTEINUR NEGATIVE 12/13/2016 1112   UROBILINOGEN 1.0 01/03/2014 1748   NITRITE NEGATIVE 12/13/2016 1112   LEUKOCYTESUR NEGATIVE 12/13/2016 1112   Sepsis Labs: @LABRCNTIP (procalcitonin:4,lacticidven:4) )No results found for this or any previous visit (from the past 240 hour(s)).   Radiological Exams on Admission: Dg Chest 2 View  Result Date: 12/13/2016 CLINICAL DATA:  Confusion.  Questionable seizure EXAM: CHEST  2 VIEW COMPARISON:  December 07, 2015 FINDINGS: There is no edema or consolidation. The heart size and pulmonary vascularity are normal. No adenopathy. No bone lesions. IMPRESSION: No edema or consolidation. Electronically Signed   By: Lowella Grip III M.D.   On: 12/13/2016 13:01   Ct Head Wo Contrast  Result Date: 12/13/2016 CLINICAL DATA:  Witnessed seizure.  Urinary incontinence. EXAM: CT HEAD WITHOUT CONTRAST TECHNIQUE: Contiguous axial images were obtained from the base of the skull through the vertex without intravenous contrast. COMPARISON:  12/08/2015 FINDINGS: Brain: No acute intracranial hemorrhage. No focal mass lesion. No CT evidence of acute infarction. No midline shift or mass effect. No hydrocephalus. Basilar cisterns are patent. Vascular: No hyperdense vessel or unexpected calcification. Skull: Bilateral bilateral mastoidectomies.  No acute findings. 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 No acute orbital findings. Other: None IMPRESSION: 1. No acute intracranial findings. 2. Bilateral mastoidectomies. Electronically Signed   By: Suzy Bouchard M.D.   On: 12/13/2016 12:39    EKG:  Independently reviewed. NSR, no acute ischemic abnormalities  Assessment/Plan Principal Problem:   Hepatic encephalopathy (HCC) Active Problems:   COPD (chronic obstructive pulmonary disease) (HCC)   DM (diabetes mellitus) (HCC)   HTN (hypertension)   Cirrhosis (HCC)   Pancytopenia (HCC)    Acute Encephalopathy -Presumed due to Hepatic encephalopathy vs Seizure. -Has been non-complaint with all medications. -Will restart lactulose and xifaxan, recheck ammonia level in am. -Has been loaded with keppra in the ED. Will place on PO keppra. -Check EEG.  NASH Cirrhosis -Continue OP GI follow up. -Monitor LFTs.  Pancytopenia -Stable, due  to liver cirrhosis  DM -Check A1C. -Place on sensitive SSI.  HTN -Continue home meds. -Fair control at present   DVT prophylaxis: SCDs  Code Status: presumed full code  Family Communication: no family at bedside  Disposition Plan: pending medical stability. Will need PT evaluation prior to DC home  Consults called: None  Admission status: observation    Time Spent: 85 minutes  Lelon Frohlich MD [facility] Pager [phone]  If 7PM-7AM, please contact night-coverage [email or web address] Password [facility]  12/13/2016, 5:21 PM

## 2016-12-13 NOTE — ED Notes (Signed)
EKG given to Dr. Mesner. 

## 2016-12-13 NOTE — ED Notes (Signed)
To radiology

## 2016-12-13 NOTE — ED Provider Notes (Signed)
[facility] DEPT Provider Note   CSN: [id number] Arrival date & time: 12/13/16  1026   By signing my name below, I, Mayer Masker, attest that this documentation has been prepared under the direction and in the presence of Merrily Pew, MD. Electronically Signed: Mayer Masker, Scribe. 12/13/16. 11:25 AM.  History   Chief Complaint Chief Complaint  Patient presents with  . Altered Mental Status  Level 5 caveat due to mental status  HPI Comments: [name] [name] is a 56 y.o. male with a h/o seizure and severe mental illness per sister in law who presents to the Emergency Department for altered mental status. His brother in law states he was found on the ground shaking "all over" just PTA. His brother noticed a bruise on his back, and believes the patient had fallen but is not sure. His brother in law states his eyes were open and he was not aware of his surroundings. This lasted 20 seconds after they found him, but they do not know how long he had been in that state. Sister in law states pt is normally fairly talkative at baseline. Family at bedside states pt is several medications but they are not sure if he is taking them as he should and note h/o medication non-compliance.   The history is provided by a relative. No language interpreter was used.    Past Medical History:  Diagnosis Date  . Anemia   . Anxiety   . Arthritis   . Bipolar 1 disorder ([place])   . Blind right eye   . Blood clots in brain    per patient  . Cardiomegaly   . Cataract    Right eye  . Cerebrovascular disease    Right vertebral artery  . Cholelithiasis    Asymptomatic  . Chronic back pain   . Cirrhosis ([place])    suspected NASH  . Cirrhosis ([place])    diagnosed 07/2010 when presented with first episode of hepatic encephalopathy, afp on 416/13=4.8,  U/S on 12/14/11  liver stable, pt has received 2 hep A/B vaccines  . COPD (chronic obstructive pulmonary disease) ([place])   . Depression   . Diabetes mellitus     . Diverticulosis   . GERD (gastroesophageal reflux disease)   . Hard of hearing   . Hepatic encephalopathy ([place])   . Hyperlipidemia   . Hypertension   . Mental retardation   . Seizures ([place])   . Thrombocytopenia [facility])     Patient Active Problem List   Diagnosis Date Noted  . Encephalopathy 12/13/2016  . Anxiety   . Suicidal ideation   . Altered mental state 12/07/2015  . Acute hepatic encephalopathy ([place]) 08/02/2013  . Hypokalemia 02/01/2013  . Hyperammonemia ([place]) 01/31/2013  . Urinary incontinence 01/31/2013  . Pancytopenia ([place]) 09/17/2012  . Edema of right lower extremity 04/28/2012  . Hepatic encephalopathy ([place]) 04/28/2012  . Arthritis of ankle or foot, degenerative 04/26/2012  . Hematemesis 12/07/2011  . Thrombocytopenia ([place]) 12/07/2011  . Anemia 12/07/2011  . COPD (chronic obstructive pulmonary disease) ([place]) 08/25/2011  . DM (diabetes mellitus) ([place]) 08/25/2011  . HTN (hypertension) 08/25/2011  . Coagulopathy ([place]) 08/25/2011  . Cirrhosis ([place]) 08/25/2011  . Depression 08/25/2011  . Seizure disorder ([place]) 08/25/2011  . GERD (gastroesophageal reflux disease) 08/25/2011  . Tobacco abuse 08/25/2011  . Ankle fracture, bimalleolar, closed 08/04/2011    Past Surgical History:  Procedure Laterality Date  . CAST APPLICATION  3/0/1601   Procedure: MINOR CAST APPLICATION;  Surgeon: [name],  MD;  Location: [facility];  Service: Orthopedics;  Laterality: Right;  no anesthesia , procedure room do not need or room !  . COLONOSCOPY  12/15/11   colonic divericulosis;poor prep, ACBE  recommended but has not been done  . ear operations    . ESOPHAGOGASTRODUODENOSCOPY  02/02/2011   Rourk-Normal esophagus.  No varices/Question mild portal gastropathy, extrinsic compression on the antrum, lesser curvature of uncertain significance, some minimally  nodular mucosa status post biopsy, patent pylorus, normal duodenum 1 and duodenum 2.  . ESOPHAGOGASTRODUODENOSCOPY  12/15/11    Rourk-->mild changes of portal gastropathy, gastric/duodenal erosions s/p bx  (reactive changes with mild chronic inflammation. No H.pylori  . FOOT SURGERY    . ORIF ANKLE FRACTURE  08/27/2011   Procedure: OPEN REDUCTION INTERNAL FIXATION (ORIF) ANKLE FRACTURE;  Surgeon: [name], MD;  Location: [facility];  Service: Orthopedics;  Laterality: Right;  . TONSILLECTOMY         Home Medications    Prior to Admission medications   Medication Sig Start Date End Date Taking? Authorizing Provider  ALPRAZolam Duanne Moron) 0.5 MG tablet Take 0.5 mg by mouth 3 (three) times daily as needed for anxiety.   Yes Historical Provider, MD  aspirin EC 81 MG tablet Take 81 mg by mouth daily.   Yes Historical Provider, MD  fish oil-omega-3 fatty acids 1000 MG capsule Take 1 g by mouth 3 (three) times daily.   Yes Historical Provider, MD  gabapentin (NEURONTIN) 300 MG capsule Take 300 mg by mouth 3 (three) times daily.   Yes Historical Provider, MD  lactulose (CHRONULAC) 10 GM/15ML solution Take 60 mLs (40 g total) by mouth 3 (three) times daily. Titrate for 3-4 bowel motions daily. 01/03/14  Yes Carmin Muskrat, MD  metoprolol tartrate (LOPRESSOR) 25 MG tablet Take 25 mg by mouth 3 (three) times daily. Take 25 mg in the morning and 50 mg at night   Yes Historical Provider, MD  Multiple Vitamin (MULTIVITAMIN WITH MINERALS) TABS Take 1 tablet by mouth daily.   Yes Historical Provider, MD  omeprazole (PRILOSEC) 20 MG capsule Take 20 mg by mouth daily.   Yes Historical Provider, MD  Insulin Detemir (LEVEMIR FLEXPEN) 100 UNIT/ML Pen Inject into the skin daily at 10 pm.    Historical Provider, MD    Family History Family History  Problem Relation Age of Onset  . Heart failure Mother   . Thyroid disease Mother   . Cancer Father   . Asthma Brother   . Heart attack Brother   . Cirrhosis Brother     EtOH  . Colon cancer Maternal Aunt     Social History Social History  Substance Use Topics  . Smoking status:  Current Every Day Smoker    Packs/day: 1.00    Years: 30.00    Types: Cigarettes  . Smokeless tobacco: Never Used     Comment: only smokes about 1-2 a day  . Alcohol use No     Allergies   Penicillins   Review of Systems Review of Systems  Unable to perform ROS: Mental status change     Physical Exam Updated Vital Signs BP (!) 157/80   Pulse (!) 57   Temp 98.3 F (36.8 C) (Oral)   Resp 12   Ht 5' 8"  (1.727 m)   Wt 250 lb (113.4 kg)   SpO2 100%   BMI 38.01 kg/m   Physical Exam  Constitutional: He appears well-developed and well-nourished. No distress.  HENT:  Head: Normocephalic  and atraumatic.  Eyes:  left pupil 70m reactive; blind in right eye.  Cardiovascular: Normal rate, regular rhythm and normal heart sounds.  Exam reveals no friction rub.   No murmur heard. Pulmonary/Chest: Effort normal. No respiratory distress. He has wheezes. He has no rales. He exhibits no tenderness.  Abdominal: He exhibits no distension.  Musculoskeletal:  Abrasion and bruising to right posterior pelvis.   Neurological: He is alert.  Cranial nerves grossly intact. Does not follow commands  Skin: Skin is warm and dry.  Psychiatric:  Reacts/withdrawl to pain in both lower extrimities  Nursing note and vitals reviewed.    ED Treatments / Results  DIAGNOSTIC STUDIES: Oxygen Saturation is 97% on RA, normal by my interpretation.    COORDINATION OF CARE: 11:23 AM Discussed treatment plan with family at bedside. Labs (all labs ordered are listed, but only abnormal results are displayed) Labs Reviewed  CBC WITH DIFFERENTIAL/PLATELET - Abnormal; Notable for the following:       Result Value   WBC 3.2 (*)    RBC 4.07 (*)    Hemoglobin 12.8 (*)    Platelets 63 (*)    Neutro Abs 1.6 (*)    All other components within normal limits  COMPREHENSIVE METABOLIC PANEL - Abnormal; Notable for the following:    Glucose, Bld 121 (*)    Total Protein 6.4 (*)    Albumin 3.2 (*)    AST 43  (*)    Total Bilirubin 2.0 (*)    Anion gap 4 (*)    All other components within normal limits  AMMONIA - Abnormal; Notable for the following:    Ammonia 87 (*)    All other components within normal limits  RAPID URINE DRUG SCREEN, HOSP PERFORMED - Abnormal; Notable for the following:    Benzodiazepines POSITIVE (*)    All other components within normal limits  URINALYSIS, ROUTINE W REFLEX MICROSCOPIC - Abnormal; Notable for the following:    Hgb urine dipstick MODERATE (*)    All other components within normal limits  CBG MONITORING, ED - Abnormal; Notable for the following:    Glucose-Capillary 127 (*)    All other components within normal limits  BLOOD GAS, ARTERIAL    EKG  EKG Interpretation  Date/Time:  Monday December 13 2016 10:40:34 EDT Ventricular Rate:  59 PR Interval:    QRS Duration: 93 QT Interval:  461 QTC Calculation: 457 R Axis:   56 Text Interpretation:  Sinus rhythm Baseline wander in lead(s) V4 No significant change since last tracing Confirmed by [facility] VailMD, JCorene Cornea([phone] on 12/13/2016 12:49:34 PM       Radiology Dg Chest 2 View  Result Date: 12/13/2016 CLINICAL DATA:  Confusion.  Questionable seizure EXAM: CHEST  2 VIEW COMPARISON:  December 07, 2015 FINDINGS: There is no edema or consolidation. The heart size and pulmonary vascularity are normal. No adenopathy. No bone lesions. IMPRESSION: No edema or consolidation. Electronically Signed   By: WLowella GripIII M.D.   On: 12/13/2016 13:01   Ct Head Wo Contrast  Result Date: 12/13/2016 CLINICAL DATA:  Witnessed seizure.  Urinary incontinence. EXAM: CT HEAD WITHOUT CONTRAST TECHNIQUE: Contiguous axial images were obtained from the base of the skull through the vertex without intravenous contrast. COMPARISON:  12/08/2015 FINDINGS: Brain: No acute intracranial hemorrhage. No focal mass lesion. No CT evidence of acute infarction. No midline shift or mass effect. No hydrocephalus. Basilar cisterns are patent.  Vascular: No hyperdense vessel or unexpected calcification. Skull: Bilateral  bilateral mastoidectomies.  No acute findings. 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 No acute orbital findings. Other: None IMPRESSION: 1. No acute intracranial findings. 2. Bilateral mastoidectomies. Electronically Signed   By: Suzy Bouchard M.D.   On: 12/13/2016 12:39    Procedures Procedures (including critical care time)  Medications Ordered in ED Medications  sodium chloride 0.9 % bolus 1,000 mL (0 mLs Intravenous Stopped 12/13/16 1421)  levETIRAcetam (KEPPRA) IVPB 1000 mg/100 mL premix (0 mg Intravenous Stopped 12/13/16 1350)  lactulose (CHRONULAC) 10 GM/15ML solution 30 g (30 g Oral Given 12/13/16 1335)     Initial Impression / Assessment and Plan / ED Course  I have reviewed the triage vital signs and the nursing notes.  Pertinent labs & imaging results that were available during my care of the patient were reviewed by me and considered in my medical decision making (see chart for details).  Encephalopathic. Difficult to ascertain if it is post ictal but seems to be more related to hepatic cause. Given keppra here (supposed to be on it at home?) and lactulose to see if it helps. Protecting airway currently but not participating in history giving. Will admit for obserbation for further workup/management.  Final Clinical Impressions(s) / ED Diagnoses   Final diagnoses:  Altered mental status, unspecified altered mental status type   I personally performed the services described in this documentation, which was scribed in my presence. The recorded information has been reviewed and is accurate.    Merrily Pew, MD 12/13/16 [phone]

## 2016-12-13 NOTE — ED Notes (Signed)
EKG given to Dr. Dayna Barker. Aware of pt symptoms.

## 2016-12-13 NOTE — ED Triage Notes (Signed)
Per EMS: Pt alert on EMS arrival but confused, was crawling around on floor, episode of incontinence.  PT is blind in R eye, L eye dilated to 37m. Pt was following commands with EMS, negative stroke scale by EMS. Bilateral arms cool to touch. Sister in law states he had a seizure, witnessed. A "narcotic" bottle is missing, sister in law thinks his wife may have hid medication. Wife is currently admitted to hospital.   139 cbg, 97%, 188/101, 57p, 20rr lungs clear

## 2016-12-14 ENCOUNTER — Observation Stay (HOSPITAL_COMMUNITY)
Admit: 2016-12-14 | Discharge: 2016-12-14 | Disposition: A | Discharge status: Home / Self Care | Payer: Medicare HMO | Attending: Internal Medicine | Admitting: Internal Medicine

## 2016-12-14 DIAGNOSIS — K7290 Hepatic failure, unspecified without coma: Secondary | ICD-10-CM | POA: Diagnosis not present

## 2016-12-14 DIAGNOSIS — L8990 Pressure ulcer of unspecified site, unspecified stage: Secondary | ICD-10-CM | POA: Insufficient documentation

## 2016-12-14 LAB — COMPREHENSIVE METABOLIC PANEL
ALBUMIN: 2.8 g/dL — AB (ref 3.5–5.0)
ALT: 17 U/L (ref 17–63)
AST: 35 U/L (ref 15–41)
Alkaline Phosphatase: 82 U/L (ref 38–126)
Anion gap: 6 (ref 5–15)
BUN: 13 mg/dL (ref 6–20)
CHLORIDE: 110 mmol/L (ref 101–111)
CO2: 25 mmol/L (ref 22–32)
CREATININE: 0.67 mg/dL (ref 0.61–1.24)
Calcium: 8.4 mg/dL — ABNORMAL LOW (ref 8.9–10.3)
GFR calc non Af Amer: >60 mL/min (ref >60)
GLUCOSE: 90 mg/dL (ref 65–99)
Potassium: 3.5 mmol/L (ref 3.5–5.1)
SODIUM: 141 mmol/L (ref 135–145)
Total Bilirubin: 2 mg/dL — ABNORMAL HIGH (ref 0.3–1.2)
Total Protein: 5.7 g/dL — ABNORMAL LOW (ref 6.5–8.1)

## 2016-12-14 LAB — CBC
HCT: 34.8 % — ABNORMAL LOW (ref 39.0–52.0)
HEMOGLOBIN: 11.2 g/dL — AB (ref 13.0–17.0)
MCH: 31.4 pg (ref 26.0–34.0)
MCHC: 32.2 g/dL (ref 30.0–36.0)
MCV: 97.5 fL (ref 78.0–100.0)
PLATELETS: 61 10*3/uL — AB (ref 150–400)
RBC: 3.57 MIL/uL — AB (ref 4.22–5.81)
RDW: 15 % (ref 11.5–15.5)
WBC: 3.5 10*3/uL — ABNORMAL LOW (ref 4.0–10.5)

## 2016-12-14 LAB — AMMONIA: Ammonia: 96 umol/L — ABNORMAL HIGH (ref 9–35)

## 2016-12-14 LAB — GLUCOSE, CAPILLARY
GLUCOSE-CAPILLARY: 81 mg/dL (ref 65–99)
GLUCOSE-CAPILLARY: 67 mg/dL (ref 65–99)
Glucose-Capillary: 103 mg/dL — ABNORMAL HIGH (ref 65–99)
Glucose-Capillary: 195 mg/dL — ABNORMAL HIGH (ref 65–99)
Glucose-Capillary: 113 mg/dL — ABNORMAL HIGH (ref 65–99)

## 2016-12-14 LAB — HIV ANTIBODY (ROUTINE TESTING W REFLEX): HIV SCREEN 4TH GENERATION: NONREACTIVE

## 2016-12-14 NOTE — Progress Notes (Signed)
EEG completed, results pending. 

## 2016-12-14 NOTE — Progress Notes (Signed)
PROGRESS NOTE    [name] [name]  ZOX:[id number] DOB: Jan 12, 1961 DOA: 12/13/2016 PCP: Hermine Messick, MD     Brief Narrative:  56 y/o man admitted from home on 4/23 due to confusion and fall. Suspect hepatic encephalopathy and medication non-compliance as main etiology.   Assessment & Plan:   Principal Problem:   Hepatic encephalopathy (HCC) Active Problems:   COPD (chronic obstructive pulmonary disease) (HCC)   DM (diabetes mellitus) ([employer])   HTN (hypertension)   Cirrhosis (HCC)   Pancytopenia (HCC)   Pressure injury of skin   Hepatic Encephalopathy -Improved today, altho still not at baseline. -Continue lactulose/xifaxan, recheck ammonia level in am. -Suspect medication non-compliance as main etiology. -EEG done with results pending.  NASH Cirrhosis -F/u with GI OP.  Pancytopenia -Due to cirrhosis -At baseline.  DM -Well controlled. -Continue current regimen.  HTN -Fair control. -Do not anticipate medication changes while in the hospital.   DVT prophylaxis: SCDs Code Status: full code Family Communication: wife via phone Disposition Plan: PT evaluation, anticipate DC home in 24-48 hours  Consultants:   None  Procedures:   None  Antimicrobials:  Anti-infectives    Start     Dose/Rate Route Frequency Ordered Stop   12/13/16 2200  rifaximin (XIFAXAN) tablet 550 mg     550 mg Oral 2 times daily 12/13/16 1723         Subjective: Anxious to be discharged, more awake but still drowsy. Per wife, not at baseline.  Objective: Vitals:   12/13/16 2016 12/13/16 2200 12/14/16 0621 12/14/16 1110  BP:  (!) 166/78 (!) 158/70 (!) 154/69  Pulse:  63 (!) 57   Resp:  16 16   Temp:  98.3 F (36.8 C) 98.7 F (37.1 C)   TempSrc:  Oral Oral   SpO2: 99% 98% 95%   Weight:      Height:        Intake/Output Summary (Last 24 hours) at 12/14/16 1720 Last data filed at 12/14/16 0245  Gross per 24 hour  Intake               50 ml  Output              700 ml    Net             -650 ml   Filed Weights   12/13/16 1032  Weight: 113.4 kg (250 lb)    Examination:  General exam:  Awake but falls asleep easily,  Respiratory system: Clear to auscultation. Respiratory effort normal. Cardiovascular system:RRR. No murmurs, rubs, gallops. Gastrointestinal system: Abdomen is nondistended, soft and nontender. No organomegaly or masses felt. Normal bowel sounds heard. Central nervous system: Alert and oriented. No focal neurological deficits. Extremities: No C/C/E, +pedal pulses Skin: No rashes, lesions or ulcers Psychiatry: Judgement and insight appear normal. Mood & affect appropriate.     Data Reviewed: I have personally reviewed following labs and imaging studies  CBC:  Recent Labs Lab 12/13/16 1207 12/14/16 0407  WBC 3.2* 3.5*  NEUTROABS 1.6*  --   HGB 12.8* 11.2*  HCT 39.2 34.8*  MCV 96.3 97.5  PLT 63* 61*   Basic Metabolic Panel:  Recent Labs Lab 12/13/16 1207 12/14/16 0407  NA 140 141  K 4.6 3.5  CL 107 110  CO2 29 25  GLUCOSE 121* 90  BUN 10 13  CREATININE 0.76 0.67  CALCIUM 8.9 8.4*   GFR: Estimated Creatinine Clearance: 124.5 mL/min (by C-G formula based on SCr of  0.67 mg/dL). Liver Function Tests:  Recent Labs Lab 12/13/16 1207 12/14/16 0407  AST 43* 35  ALT 19 17  ALKPHOS 91 82  BILITOT 2.0* 2.0*  PROT 6.4* 5.7*  ALBUMIN 3.2* 2.8*   No results for input(s): LIPASE, AMYLASE in the last 168 hours.  Recent Labs Lab 12/13/16 1207 12/14/16 0407  AMMONIA 87* 96*   Coagulation Profile: No results for input(s): INR, PROTIME in the last 168 hours. Cardiac Enzymes: No results for input(s): CKTOTAL, CKMB, CKMBINDEX, TROPONINI in the last 168 hours. BNP (last 3 results) No results for input(s): PROBNP in the last 8760 hours. HbA1C: No results for input(s): HGBA1C in the last 72 hours. CBG:  Recent Labs Lab 12/13/16 1047 12/13/16 1559 12/13/16 2203 12/14/16 0755 12/14/16 1126  GLUCAP 127* 100* 87  103* 81   Lipid Profile: No results for input(s): CHOL, HDL, LDLCALC, TRIG, CHOLHDL, LDLDIRECT in the last 72 hours. Thyroid Function Tests: No results for input(s): TSH, T4TOTAL, FREET4, T3FREE, THYROIDAB in the last 72 hours. Anemia Panel: No results for input(s): VITAMINB12, FOLATE, FERRITIN, TIBC, IRON, RETICCTPCT in the last 72 hours. Urine analysis:    Component Value Date/Time   COLORURINE YELLOW 12/13/2016 [place] 12/13/2016 1112   LABSPEC 1.008 12/13/2016 1112   PHURINE 7.0 12/13/2016 1112   GLUCOSEU NEGATIVE 12/13/2016 1112   HGBUR MODERATE (A) 12/13/2016 1112   BILIRUBINUR NEGATIVE 12/13/2016 1112   KETONESUR NEGATIVE 12/13/2016 1112   PROTEINUR NEGATIVE 12/13/2016 1112   UROBILINOGEN 1.0 01/03/2014 1748   NITRITE NEGATIVE 12/13/2016 1112   LEUKOCYTESUR NEGATIVE 12/13/2016 1112   Sepsis Labs: @LABRCNTIP (procalcitonin:4,lacticidven:4)  )No results found for this or any previous visit (from the past 240 hour(s)).       Radiology Studies: Dg Chest 2 View  Result Date: 12/13/2016 CLINICAL DATA:  Confusion.  Questionable seizure EXAM: CHEST  2 VIEW COMPARISON:  December 07, 2015 FINDINGS: There is no edema or consolidation. The heart size and pulmonary vascularity are normal. No adenopathy. No bone lesions. IMPRESSION: No edema or consolidation. Electronically Signed   By: Lowella Grip III M.D.   On: 12/13/2016 13:01   Ct Head Wo Contrast  Result Date: 12/13/2016 CLINICAL DATA:  Witnessed seizure.  Urinary incontinence. EXAM: CT HEAD WITHOUT CONTRAST TECHNIQUE: Contiguous axial images were obtained from the base of the skull through the vertex without intravenous contrast. COMPARISON:  12/08/2015 FINDINGS: Brain: No acute intracranial hemorrhage. No focal mass lesion. No CT evidence of acute infarction. No midline shift or mass effect. No hydrocephalus. Basilar cisterns are patent. Vascular: No hyperdense vessel or unexpected calcification. Skull:  Bilateral bilateral mastoidectomies.  No acute findings. 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 No acute orbital findings. Other: None IMPRESSION: 1. No acute intracranial findings. 2. Bilateral mastoidectomies. Electronically Signed   By: Suzy Bouchard M.D.   On: 12/13/2016 12:39        Scheduled Meds: . aspirin EC  81 mg Oral Daily  . folic acid  1 mg Oral Daily  . insulin aspart  0-5 Units Subcutaneous QHS  . insulin aspart  0-9 Units Subcutaneous TID WC  . insulin aspart  3 Units Subcutaneous TID WC  . lactulose  40 g Oral TID  . levETIRAcetam  500 mg Oral BID  . metoprolol tartrate  25 mg Oral Daily   And  . metoprolol tartrate  50 mg Oral QHS  . multivitamin with minerals  1 tablet Oral Daily  . omega-3 acid ethyl esters  1 g Oral TID  . rifaximin  550 mg Oral BID  . thiamine  100 mg Oral Daily   Continuous Infusions: . sodium chloride       LOS: 0 days    Time spent: 25 minutes. Greater than 50% of this time was spent in direct contact with the patient coordinating care.     Lelon Frohlich, MD [facility] Pager [phone]  If 7PM-7AM, please contact night-coverage [email or web address] Password [email or web address] 12/14/2016, 5:20 PM

## 2016-12-14 NOTE — Care Management Obs Status (Signed)
[facility] NOTIFICATION   Patient Details  Name: [name] [name] MRN: [id number] Date of Birth: July 26, 1961   Medicare Observation Status Notification Given:  Yes    Sherald Barge, RN 12/14/2016, 9:58 AM

## 2016-12-15 DIAGNOSIS — K7469 Other cirrhosis of liver: Secondary | ICD-10-CM

## 2016-12-15 DIAGNOSIS — K7290 Hepatic failure, unspecified without coma: Secondary | ICD-10-CM | POA: Diagnosis not present

## 2016-12-15 DIAGNOSIS — I10 Essential (primary) hypertension: Secondary | ICD-10-CM | POA: Diagnosis not present

## 2016-12-15 DIAGNOSIS — D61818 Other pancytopenia: Secondary | ICD-10-CM | POA: Diagnosis not present

## 2016-12-15 LAB — GLUCOSE, CAPILLARY
GLUCOSE-CAPILLARY: 161 mg/dL — AB (ref 65–99)
Glucose-Capillary: 96 mg/dL (ref 65–99)

## 2016-12-15 LAB — AMMONIA: AMMONIA: 49 umol/L — AB (ref 9–35)

## 2016-12-15 MED ORDER — LACTULOSE 10 GM/15ML PO SOLN: 40.0000 g | Freq: Three times a day (TID) | ORAL | 2 refills | Status: DC

## 2016-12-15 NOTE — Progress Notes (Signed)
Discharge instructions gone over with patient and family. Verbalized understanding. Printed prescription given to patient.

## 2016-12-15 NOTE — Care Management Note (Signed)
Case Management Note  Patient Details  Name: [name] [name] MRN: [id number] Date of Birth: 26-Jun-1961  Subjective/Objective:                  Pt admitted with AMS. He is from home, lives with his wife and is ind with ADL's. He has PCP, transportation and insurance with drug coverage. Per wife pt has no HH or DME needs. PT has seen pt and recommends no f/u. No needs communicated.   Action/Plan: Pt discharging home today with self care.   Expected Discharge Date:  12/15/16               Expected Discharge Plan:  Home/Self Care  In-House Referral:  NA  Discharge planning Services  CM Consult  Post Acute Care Choice:  NA Choice offered to:  NA  Status of Service:  Completed, signed off  Sherald Barge, RN 12/15/2016, 1:44 PM

## 2016-12-15 NOTE — Evaluation (Signed)
Physical Therapy Evaluation Patient Details Name: [name] [name] MRN: [id number] DOB: 09-May-1961 Today's Date: 12/15/2016   History of Present Illness  56 y.o. male with h/o NASH cirrhosis and repeated episodes of hepatic encephalopathy due to medication noncompliance, DM, seizure disorder, not taking his meds and bipolar disorder, comes in today with increased confusion. Patient is quite somnolent at time of my evaluation and no family members are present. Per report given to EDP,  Family member stated that he was found on the floor shaking and improved after 20 minutes but never regained complete consciousness. Brought to the ED for evaluation. VSS, labs significant for chronic pancytopenia, CT head negative for acute findings, UA and CXR negative for evidence of infectious sources.  Dx: toxic metabolic encephalopathy  Clinical Impression  Pt received in bed, and was agreeable to PT evaluation.  Pt states he uses a RW and cane for household mobility.  He is independent with dressing, but requires assistance from his wife getting in/out of the shower.  During PT evaluation, he demonstrated sit<>stand at Mod (I) level with RW, and he was able to ambulate 252f with RW at Mod (I) level.  Pt states he is at baseline, therefore, acute PT will sign off, no follow up needs at this time.     Follow Up Recommendations No PT follow up    Equipment Recommendations  None recommended by PT    Recommendations for Other Services       Precautions / Restrictions Precautions Precautions: Fall Precaution Comments: 2 falls in the past 6 months.  Restrictions Weight Bearing Restrictions: No      Mobility  Bed Mobility Overal bed mobility: Modified Independent                Transfers Overall transfer level: Modified independent Equipment used: Rolling walker (2 wheeled)                Ambulation/Gait Ambulation/Gait assistance: Modified independent (Device/Increase time) Ambulation  Distance (Feet): 200 Feet Assistive device: Rolling walker (2 wheeled) Gait Pattern/deviations: WFL(Within Functional Limits)   Gait velocity interpretation: <1.8 ft/sec, indicative of risk for recurrent falls General Gait Details: Extremely pronated feet with R being worse than the left.  Noted chronic wound on R navicular/medial cunieform area.    Stairs            Wheelchair Mobility    Modified Rankin (Stroke Patients Only)       Balance Overall balance assessment: History of Falls;Needs assistance Sitting-balance support: Bilateral upper extremity supported;Feet supported Sitting balance-Leahy Scale: Good     Standing balance support: Bilateral upper extremity supported Standing balance-Leahy Scale: Fair                               Pertinent Vitals/Pain Pain Assessment: 0-10 Pain Score: 5  Pain Location: bad HA Pain Descriptors / Indicators: Aching Pain Intervention(s): Limited activity within patient's tolerance;Monitored during session;Repositioned    Home Living   Living Arrangements: Spouse/significant other Available Help at Discharge: Available 24 hours/day Type of Home: Apartment Home Access: Level entry     Home Layout: One level Home Equipment: Walker - 2 wheels;Cane - single point      Prior Function     Gait / Transfers Assistance Needed: Pt ambulates with RW for short trips.  He occasionally uses the cane.  Household ambulation distances only.   ADL's / Homemaking Assistance Needed: independent with dressing, and assistance  from wife to get in/out of the shower for bathing.         Hand Dominance   Dominant Hand: Left    Extremity/Trunk Assessment   Upper Extremity Assessment Upper Extremity Assessment: Overall WFL for tasks assessed    Lower Extremity Assessment Lower Extremity Assessment: Overall WFL for tasks assessed       Communication   Communication: HOH;Other (comment) (poor vision)  Cognition  Arousal/Alertness: Awake/alert Behavior During Therapy: WFL for tasks assessed/performed Overall Cognitive Status: Within Functional Limits for tasks assessed                                        General Comments      Exercises     Assessment/Plan    PT Assessment Patent does not need any further PT services  PT Problem List         PT Treatment Interventions Gait training;Therapeutic activities    PT Goals (Current goals can be found in the Care Plan section)  Acute Rehab PT Goals Patient Stated Goal: To go home.  PT Goal Formulation: All assessment and education complete, DC therapy    Frequency     Barriers to discharge        Co-evaluation               End of Session Equipment Utilized During Treatment: Gait belt Activity Tolerance: Patient tolerated treatment well Patient left: in chair;with call bell/phone within reach;with chair alarm set Nurse Communication: Mobility status PT Visit Diagnosis: Other abnormalities of gait and mobility (R26.89)    Time: 0947-0962 PT Time Calculation (min) (ACUTE ONLY): 30 min   Charges:   PT Evaluation $PT Eval Low Complexity: 1 Procedure PT Treatments $Gait Training: 8-22 mins   PT G Codes:   PT G-Codes **NOT FOR INPATIENT CLASS** Functional Assessment Tool Used: AM-PAC 6 Clicks Basic Mobility;Clinical judgement Functional Limitation: Mobility: Walking and moving around Mobility: Walking and Moving Around Current Status (E3662): At least 20 percent but less than 40 percent impaired, limited or restricted Mobility: Walking and Moving Around Goal Status [phone]): At least 20 percent but less than 40 percent impaired, limited or restricted Mobility: Walking and Moving Around Discharge Status [phone]): At least 20 percent but less than 40 percent impaired, limited or restricted    Beth Davette Nugent, PT, DPT X: [phone]

## 2016-12-15 NOTE — Discharge Summary (Signed)
[name] [name], is a 56 y.o. male  DOB [birth year]  MRN [id number].  Admission date:  12/13/2016  Admitting Physician  Erline Hau, MD  Discharge Date:  12/15/2016   Primary MD  Hermine Messick, MD  Recommendations for primary care physician for things to follow:  - please check CBC, CMP, ammonia during next visit.   Admission Diagnosis  AMS   Discharge Diagnosis  AMS   Principal Problem:   Hepatic encephalopathy (HCC) Active Problems:   COPD (chronic obstructive pulmonary disease) (HCC)   DM (diabetes mellitus) ([place])   HTN (hypertension)   Cirrhosis ([place])   Pancytopenia (HCC)   Pressure injury of skin      Past Medical History:  Diagnosis Date  . Anemia   . Anxiety   . Arthritis   . Bipolar 1 disorder ([place])   . Blind right eye   . Blood clots in brain    per patient  . Cardiomegaly   . Cataract    Right eye  . Cerebrovascular disease    Right vertebral artery  . Cholelithiasis    Asymptomatic  . Chronic back pain   . Cirrhosis ([place])    suspected NASH  . Cirrhosis ([place])    diagnosed 07/2010 when presented with first episode of hepatic encephalopathy, afp on 416/13=4.8,  U/S on 12/14/11  liver stable, pt has received 2 hep A/B vaccines  . COPD (chronic obstructive pulmonary disease) ([place])   . Depression   . Diabetes mellitus   . Diverticulosis   . GERD (gastroesophageal reflux disease)   . Hard of hearing   . Hepatic encephalopathy ([place])   . Hyperlipidemia   . Hypertension   . Mental retardation   . Seizures ([place])   . Thrombocytopenia ([place])     Past Surgical History:  Procedure Laterality Date  . CAST APPLICATION  10/01/7679   Procedure: MINOR CAST APPLICATION;  Surgeon: [name], MD;  Location: [facility];  Service: Orthopedics;  Laterality: Right;  no anesthesia , procedure room do not need or room !  . COLONOSCOPY  12/15/11   colonic divericulosis;poor  prep, ACBE  recommended but has not been done  . ear operations    . ESOPHAGOGASTRODUODENOSCOPY  02/02/2011   [place]ourk-Normal esophagus.  No varices/Question mild portal gastropathy, extrinsic compression on the antrum, lesser curvature of uncertain significance, some minimally  nodular mucosa status post biopsy, patent pylorus, normal duodenum 1 and duodenum 2.  . ESOPHAGOGASTRODUODENOSCOPY  12/15/11   Rourk-->mild changes of portal gastropathy, gastric/duodenal erosions s/p bx  (reactive changes with mild chronic inflammation. No H.pylori  . FOOT SURGERY    . ORIF ANKLE FRACTURE  08/27/2011   Procedure: OPEN REDUCTION INTERNAL FIXATION (ORIF) ANKLE FRACTURE;  Surgeon: [name], MD;  Location: [facility];  Service: Orthopedics;  Laterality: Right;  . TONSILLECTOMY         History of present illness and  Hospital Course:     Kindly see H&P for history of present illness and admission details, please review  complete Labs, Consult reports and Test reports for all details in brief  HPI  from the history and physical done on the day of admission 12/13/2016 HPI: [name] [name] is a 56 y.o. male with h/o NASH cirrhosis and repeated episodes of hepatic encephalopathy due to medication noncompliance, DM, seizure disorder, not taking his meds and bipolar disorder, comes in today with increased confusion. Patient is quite somnolent at time of my evaluation and no family members are present. Per report given to EDP,  Family member stated that he was found on the floor shaking and improved after 20 minutes but never regained complete consciousness. Brought to the ED for evaluation. VSS, labs significant for chronic pancytopenia, CT head negative for acute findings, UA and CXR negative for evidence of infectious sources. Admission has been requested.   Hospital Course   Acute encephalopathy - Related to hepatic metabolic encephalopathy, secondary to noncompliance with his kilos, as he been skipping the  dose whenever he goes to church or outside, you him back on lactulose and Xifaxan during hospital stay, with significant improvement of his mentation, currently back to baseline, ammonia level today is 49, was 87 on admission, EEG will with no evidence of seizures, but significant for metabolic picture, T head with no acute or cranial findings, explained to patient and family at bedside at importance of adherence to lactulose.   NASH Cirrhosis -F/u with GI OP.  Pancytopenia -Due to cirrhosis -At baseline.  DM -Well controlled. -Continue current regimen.  HTN -Fair control. -Do not anticipate medication changes while in the hospital.  Discharge Condition:  - Stable - D/W brother in law at bedside   Follow UP  [place], MD Follow up in 1 week(s).   Specialty:  Family Medicine Contact information: [address] [place] [address] [phone]             Discharge Instructions  and  Discharge Medications     Discharge Instructions    Discharge instructions    Complete by:  As directed    Follow with Primary MD Hermine Messick, MD in 7 days   Get CBC, CMP, ammonia checked  by Primary MD next visit.    Activity: As tolerated with Full fall precautions use walker/cane & assistance as needed   Disposition Home    Diet: Heart Healthy, carb modified   , with feeding assistance and aspiration precautions.  For Heart failure patients - Check your Weight same time everyday, if you gain over 2 pounds, or you develop in leg swelling, experience more shortness of breath or chest pain, call your Primary MD immediately. Follow Cardiac Low Salt Diet and 1.5 lit/day fluid restriction.   On your next visit with your primary care physician please Get Medicines reviewed and adjusted.   Please request your Prim.MD to go over all Hospital Tests and Procedure/Radiological results at the follow up, please get all Hospital records sent to  your Prim MD by signing hospital release before you go home.   If you experience worsening of your admission symptoms, develop shortness of breath, life threatening emergency, suicidal or homicidal thoughts you must seek medical attention immediately by calling 911 or calling your MD immediately  if symptoms less severe.  You Must read complete instructions/literature along with all the possible adverse reactions/side effects for all the Medicines you take and that have been prescribed to you. Take any new Medicines after you have completely understood and accpet all the possible adverse reactions/side  effects.   Do not drive, operating heavy machinery, perform activities at heights, swimming or participation in water activities or provide baby sitting services if your were admitted for syncope or siezures until you have seen by Primary MD or a Neurologist and advised to do so again.  Do not drive when taking Pain medications.    Do not take more than prescribed Pain, Sleep and Anxiety Medications  Special Instructions: If you have smoked or chewed Tobacco  in the last 2 yrs please stop smoking, stop any regular Alcohol  and or any Recreational drug use.  Wear Seat belts while driving.   Please note  You were cared for by a hospitalist during your hospital stay. If you have any questions about your discharge medications or the care you received while you were in the hospital after you are discharged, you can call the unit and asked to speak with the hospitalist on call if the hospitalist that took care of you is not available. Once you are discharged, your primary care physician will handle any further medical issues. Please note that NO REFILLS for any discharge medications will be authorized once you are discharged, as it is imperative that you return to your primary care physician (or establish a relationship with a primary care physician if you do not have one) for your aftercare needs so  that they can reassess your need for medications and monitor your lab values.   Increase activity slowly    Complete by:  As directed      Allergies as of 12/15/2016      Reactions   Penicillins Rash   Has patient had a PCN reaction causing immediate rash, facial/tongue/throat swelling, SOB or lightheadedness with hypotension: Yes Has patient had a PCN reaction causing severe rash involving mucus membranes or skin necrosis: No Has patient had a PCN reaction that required hospitalization No Has patient had a PCN reaction occurring within the last 10 years: No If all of the above answers are "NO", then may proceed with Cephalosporin use.      Medication List    TAKE these medications   ALPRAZolam 0.5 MG tablet Commonly known as:  XANAX Take 0.5 mg by mouth 3 (three) times daily as needed for anxiety.   aspirin EC 81 MG tablet Take 81 mg by mouth daily.   fish oil-omega-3 fatty acids 1000 MG capsule Take 1 g by mouth 3 (three) times daily.   gabapentin 300 MG capsule Commonly known as:  NEURONTIN Take 300 mg by mouth 3 (three) times daily.   lactulose 10 GM/15ML solution Commonly known as:  CHRONULAC Take 60 mLs (40 g total) by mouth 3 (three) times daily. Titrate for 3-4 bowel motions daily.   LEVEMIR FLEXPEN 100 UNIT/ML Pen Generic drug:  Insulin Detemir Inject into the skin daily at 10 pm.   metoprolol tartrate 25 MG tablet Commonly known as:  LOPRESSOR Take 25 mg by mouth 3 (three) times daily. Take 25 mg in the morning and 50 mg at night   multivitamin with minerals Tabs tablet Take 1 tablet by mouth daily.   omeprazole 20 MG capsule Commonly known as:  PRILOSEC Take 20 mg by mouth daily.         Diet and Activity recommendation: See Discharge Instructions above   Consults obtained -  None   Major procedures and Radiology Reports - PLEASE review detailed and final reports for all details, in brief -      Dg Chest  2 View  Result Date:  12/13/2016 CLINICAL DATA:  Confusion.  Questionable seizure EXAM: CHEST  2 VIEW COMPARISON:  December 07, 2015 FINDINGS: There is no edema or consolidation. The heart size and pulmonary vascularity are normal. No adenopathy. No bone lesions. IMPRESSION: No edema or consolidation. Electronically Signed   By: Lowella Grip III M.D.   On: 12/13/2016 13:01   Ct Head Wo Contrast  Result Date: 12/13/2016 CLINICAL DATA:  Witnessed seizure.  Urinary incontinence. EXAM: CT HEAD WITHOUT CONTRAST TECHNIQUE: Contiguous axial images were obtained from the base of the skull through the vertex without intravenous contrast. COMPARISON:  12/08/2015 FINDINGS: Brain: No acute intracranial hemorrhage. No focal mass lesion. No CT evidence of acute infarction. No midline shift or mass effect. No hydrocephalus. Basilar cisterns are patent. Vascular: No hyperdense vessel or unexpected calcification. Skull: Bilateral bilateral mastoidectomies.  No acute findings. 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 No acute orbital findings. Other: None IMPRESSION: 1. No acute intracranial findings. 2. Bilateral mastoidectomies. Electronically Signed   By: Suzy Bouchard M.D.   On: 12/13/2016 12:39    Micro Results     No results found for this or any previous visit (from the past 240 hour(s)).     Today   Subjective:   Daimien Patmon today has no headache,no chest or  abdominal pain,no new weakness tingling or numbness, feels much better wants to go home today.   Objective:   Blood pressure (!) 152/69, pulse 68, temperature 99.2 F (37.3 C), temperature source Oral, resp. rate 18, height 5' 8"  (1.727 m), weight 113.4 kg (250 lb), SpO2 96 %.   Intake/Output Summary (Last 24 hours) at 12/15/16 1326 Last data filed at 12/15/16 0352  Gross per 24 hour  Intake            822.5 ml  Output                0 ml  Net            822.5 ml    Exam Awake Alert, Oriented x 3,  Supple Neck,No JVD,   Symmetrical Chest wall movement, Good air movement bilaterally, CTAB RRR,No Gallops,Rubs or new Murmurs, No Parasternal Heave +ve B.Sounds, Abd Soft, Non tender, No rebound -guarding or rigidity. No Cyanosis, Clubbing or edema, No new Rash or bruise  Data Review   CBC w Diff:  Lab Results  Component Value Date   WBC 3.5 (L) 12/14/2016   HGB 11.2 (L) 12/14/2016   HGB 14.5 11/13/2008   HCT 34.8 (L) 12/14/2016   HCT 28 04/06/2012   PLT 61 (L) 12/14/2016   PLT 97 (L) 11/13/2008   LYMPHOPCT 29 12/13/2016   LYMPHOPCT 45.4 11/13/2008   MONOPCT 13 12/13/2016   MONOPCT 7.9 11/13/2008   EOSPCT 6 12/13/2016   EOSPCT 3.3 11/13/2008   BASOPCT 1 12/13/2016   BASOPCT 0.7 11/13/2008    CMP:  Lab Results  Component Value Date   NA 141 12/14/2016   NA 144 04/06/2012   K 3.5 12/14/2016   K 4.4 04/06/2012   CL 110 12/14/2016   CO2 25 12/14/2016   BUN 13 12/14/2016   CREATININE 0.67 12/14/2016   CREATININE 15 04/06/2012   PROT 5.7 (L) 12/14/2016   ALBUMIN 2.8 (L) 12/14/2016   ALBUMIN 2.7 04/06/2012   BILITOT 2.0 (H) 12/14/2016   BILITOT 0.9 04/06/2012   ALKPHOS 82 12/14/2016   ALKPHOS 112 04/06/2012   AST 35 12/14/2016   AST  36 04/06/2012   ALT 17 12/14/2016  .   Total Time in preparing paper work, data evaluation and todays exam - 35 minutes  Latrell Reitan M.D on 12/15/2016 at 1:26 PM  [facility]   Office  [phone]

## 2016-12-15 NOTE — Procedures (Signed)
  [place] A. Merlene Laughter, MD     [email or web address]           HISTORY: The patient is a 56 year old man who presents with altered mental status and confusion. This study is being done to evaluate for seizures or cause of the confusion.  MEDICATIONS: Scheduled Meds: Continuous Infusions: PRN Meds:.  Prior to Admission medications   Medication Sig Start Date End Date Taking? Authorizing Provider  ALPRAZolam Duanne Moron) 0.5 MG tablet Take 0.5 mg by mouth 3 (three) times daily as needed for anxiety.    Historical Provider, MD  aspirin EC 81 MG tablet Take 81 mg by mouth daily.    Historical Provider, MD  fish oil-omega-3 fatty acids 1000 MG capsule Take 1 g by mouth 3 (three) times daily.    Historical Provider, MD  gabapentin (NEURONTIN) 300 MG capsule Take 300 mg by mouth 3 (three) times daily.    Historical Provider, MD  Insulin Detemir (LEVEMIR FLEXPEN) 100 UNIT/ML Pen Inject into the skin daily at 10 pm.    Historical Provider, MD  lactulose (CHRONULAC) 10 GM/15ML solution Take 60 mLs (40 g total) by mouth 3 (three) times daily. Titrate for 3-4 bowel motions daily. 01/03/14   Carmin Muskrat, MD  metoprolol tartrate (LOPRESSOR) 25 MG tablet Take 25 mg by mouth 3 (three) times daily. Take 25 mg in the morning and 50 mg at night    Historical Provider, MD  Multiple Vitamin (MULTIVITAMIN WITH MINERALS) TABS Take 1 tablet by mouth daily.    Historical Provider, MD  omeprazole (PRILOSEC) 20 MG capsule Take 20 mg by mouth daily.    Historical Provider, MD      ANALYSIS: A 16 channel recording using standard 10 20 measurements is conducted for 21 minutes. The background activity consists as high as 5-1/2 Hz. There is beta activity observed in the frontal areas. Awake and sleep architecture are observed. Spindles are observed. Additionally, prominent vertex sharp waves are seen throughout the recording. Photic stimulation and hyperventilation are not conducted. There is no focal  or lateralized slowing. There are multiple episodes of triphasic waves seen throughout the recording. No epileptiform activities are observed.   IMPRESSION: This recording is abnormal due to the following reasons: 1. Moderately severe global slowing indicating a moderate to severe global encephalopathy. 2. Multiple triphasic waves which are typically seen in toxic metabolic encephalopathies particularly from hepatic failure or renal failure.      Kanchan Gal A. Merlene Laughter, M.D.  Diplomate, Tax adviser of Psychiatry and Neurology ( Neurology).

## 2016-12-15 NOTE — Discharge Instructions (Signed)
Follow with Primary MD Hermine Messick, MD in 7 days   Get CBC, CMP, ammonia checked  by Primary MD next visit.    Activity: As tolerated with Full fall precautions use walker/cane & assistance as needed   Disposition Home    Diet: Heart Healthy, carb modified   , with feeding assistance and aspiration precautions.  For Heart failure patients - Check your Weight same time everyday, if you gain over 2 pounds, or you develop in leg swelling, experience more shortness of breath or chest pain, call your Primary MD immediately. Follow Cardiac Low Salt Diet and 1.5 lit/day fluid restriction.   On your next visit with your primary care physician please Get Medicines reviewed and adjusted.   Please request your Prim.MD to go over all Hospital Tests and Procedure/Radiological results at the follow up, please get all Hospital records sent to your Prim MD by signing hospital release before you go home.   If you experience worsening of your admission symptoms, develop shortness of breath, life threatening emergency, suicidal or homicidal thoughts you must seek medical attention immediately by calling 911 or calling your MD immediately  if symptoms less severe.  You Must read complete instructions/literature along with all the possible adverse reactions/side effects for all the Medicines you take and that have been prescribed to you. Take any new Medicines after you have completely understood and accpet all the possible adverse reactions/side effects.   Do not drive, operating heavy machinery, perform activities at heights, swimming or participation in water activities or provide baby sitting services if your were admitted for syncope or siezures until you have seen by Primary MD or a Neurologist and advised to do so again.  Do not drive when taking Pain medications.    Do not take more than prescribed Pain, Sleep and Anxiety Medications  Special Instructions: If you have smoked or chewed Tobacco   in the last 2 yrs please stop smoking, stop any regular Alcohol  and or any Recreational drug use.  Wear Seat belts while driving.   Please note  You were cared for by a hospitalist during your hospital stay. If you have any questions about your discharge medications or the care you received while you were in the hospital after you are discharged, you can call the unit and asked to speak with the hospitalist on call if the hospitalist that took care of you is not available. Once you are discharged, your primary care physician will handle any further medical issues. Please note that NO REFILLS for any discharge medications will be authorized once you are discharged, as it is imperative that you return to your primary care physician (or establish a relationship with a primary care physician if you do not have one) for your aftercare needs so that they can reassess your need for medications and monitor your lab values.

## 2016-12-21 ENCOUNTER — Inpatient Hospital Stay (HOSPITAL_COMMUNITY)
Admission: EM | Admit: 2016-12-21 | Discharge: 2017-01-01 | DRG: 441 | Disposition: A | Discharge status: Skilled Nursing Facility | Payer: Medicare HMO | Attending: Family Medicine | Admitting: Family Medicine

## 2016-12-21 ENCOUNTER — Emergency Department (HOSPITAL_COMMUNITY): Payer: Medicare HMO

## 2016-12-21 DIAGNOSIS — E785 Hyperlipidemia, unspecified: Secondary | ICD-10-CM | POA: Diagnosis present

## 2016-12-21 DIAGNOSIS — R296 Repeated falls: Secondary | ICD-10-CM | POA: Diagnosis present

## 2016-12-21 DIAGNOSIS — Z880 Allergy status to penicillin: Secondary | ICD-10-CM

## 2016-12-21 DIAGNOSIS — R509 Fever, unspecified: Secondary | ICD-10-CM

## 2016-12-21 DIAGNOSIS — W06XXXA Fall from bed, initial encounter: Secondary | ICD-10-CM | POA: Diagnosis present

## 2016-12-21 DIAGNOSIS — W19XXXS Unspecified fall, sequela: Secondary | ICD-10-CM | POA: Diagnosis not present

## 2016-12-21 DIAGNOSIS — K7290 Hepatic failure, unspecified without coma: Principal | ICD-10-CM | POA: Diagnosis present

## 2016-12-21 DIAGNOSIS — S066X9A Traumatic subarachnoid hemorrhage with loss of consciousness of unspecified duration, initial encounter: Secondary | ICD-10-CM | POA: Diagnosis present

## 2016-12-21 DIAGNOSIS — G9340 Encephalopathy, unspecified: Secondary | ICD-10-CM | POA: Diagnosis not present

## 2016-12-21 DIAGNOSIS — E669 Obesity, unspecified: Secondary | ICD-10-CM | POA: Diagnosis present

## 2016-12-21 DIAGNOSIS — S06330A Contusion and laceration of cerebrum, unspecified, without loss of consciousness, initial encounter: Secondary | ICD-10-CM | POA: Diagnosis not present

## 2016-12-21 DIAGNOSIS — K7469 Other cirrhosis of liver: Secondary | ICD-10-CM | POA: Diagnosis not present

## 2016-12-21 DIAGNOSIS — K219 Gastro-esophageal reflux disease without esophagitis: Secondary | ICD-10-CM | POA: Diagnosis present

## 2016-12-21 DIAGNOSIS — E7220 Disorder of urea cycle metabolism, unspecified: Secondary | ICD-10-CM | POA: Diagnosis present

## 2016-12-21 DIAGNOSIS — R4587 Impulsiveness: Secondary | ICD-10-CM | POA: Diagnosis present

## 2016-12-21 DIAGNOSIS — D696 Thrombocytopenia, unspecified: Secondary | ICD-10-CM | POA: Diagnosis present

## 2016-12-21 DIAGNOSIS — R4182 Altered mental status, unspecified: Secondary | ICD-10-CM | POA: Diagnosis present

## 2016-12-21 DIAGNOSIS — S06339A Contusion and laceration of cerebrum, unspecified, with loss of consciousness of unspecified duration, initial encounter: Secondary | ICD-10-CM

## 2016-12-21 DIAGNOSIS — S06320A Contusion and laceration of left cerebrum without loss of consciousness, initial encounter: Secondary | ICD-10-CM

## 2016-12-21 DIAGNOSIS — F17210 Nicotine dependence, cigarettes, uncomplicated: Secondary | ICD-10-CM | POA: Diagnosis present

## 2016-12-21 DIAGNOSIS — Z8249 Family history of ischemic heart disease and other diseases of the circulatory system: Secondary | ICD-10-CM

## 2016-12-21 DIAGNOSIS — F79 Unspecified intellectual disabilities: Secondary | ICD-10-CM | POA: Diagnosis present

## 2016-12-21 DIAGNOSIS — Z9119 Patient's noncompliance with other medical treatment and regimen: Secondary | ICD-10-CM

## 2016-12-21 DIAGNOSIS — G8929 Other chronic pain: Secondary | ICD-10-CM | POA: Diagnosis present

## 2016-12-21 DIAGNOSIS — F319 Bipolar disorder, unspecified: Secondary | ICD-10-CM | POA: Diagnosis present

## 2016-12-21 DIAGNOSIS — K8020 Calculus of gallbladder without cholecystitis without obstruction: Secondary | ICD-10-CM | POA: Diagnosis present

## 2016-12-21 DIAGNOSIS — W19XXXA Unspecified fall, initial encounter: Secondary | ICD-10-CM | POA: Diagnosis present

## 2016-12-21 DIAGNOSIS — R7881 Bacteremia: Secondary | ICD-10-CM

## 2016-12-21 DIAGNOSIS — E119 Type 2 diabetes mellitus without complications: Secondary | ICD-10-CM | POA: Diagnosis present

## 2016-12-21 DIAGNOSIS — Z9181 History of falling: Secondary | ICD-10-CM

## 2016-12-21 DIAGNOSIS — Z6832 Body mass index (BMI) 32.0-32.9, adult: Secondary | ICD-10-CM

## 2016-12-21 DIAGNOSIS — K7460 Unspecified cirrhosis of liver: Secondary | ICD-10-CM | POA: Diagnosis present

## 2016-12-21 DIAGNOSIS — J449 Chronic obstructive pulmonary disease, unspecified: Secondary | ICD-10-CM | POA: Diagnosis present

## 2016-12-21 DIAGNOSIS — B954 Other streptococcus as the cause of diseases classified elsewhere: Secondary | ICD-10-CM | POA: Diagnosis not present

## 2016-12-21 DIAGNOSIS — F419 Anxiety disorder, unspecified: Secondary | ICD-10-CM | POA: Diagnosis present

## 2016-12-21 DIAGNOSIS — I609 Nontraumatic subarachnoid hemorrhage, unspecified: Secondary | ICD-10-CM

## 2016-12-21 DIAGNOSIS — I10 Essential (primary) hypertension: Secondary | ICD-10-CM | POA: Diagnosis present

## 2016-12-21 DIAGNOSIS — K7581 Nonalcoholic steatohepatitis (NASH): Secondary | ICD-10-CM | POA: Diagnosis present

## 2016-12-21 DIAGNOSIS — M549 Dorsalgia, unspecified: Secondary | ICD-10-CM | POA: Diagnosis present

## 2016-12-21 DIAGNOSIS — Z79899 Other long term (current) drug therapy: Secondary | ICD-10-CM

## 2016-12-21 DIAGNOSIS — R471 Dysarthria and anarthria: Secondary | ICD-10-CM

## 2016-12-21 DIAGNOSIS — K7682 Hepatic encephalopathy: Secondary | ICD-10-CM

## 2016-12-21 DIAGNOSIS — Z7982 Long term (current) use of aspirin: Secondary | ICD-10-CM

## 2016-12-21 DIAGNOSIS — H9190 Unspecified hearing loss, unspecified ear: Secondary | ICD-10-CM | POA: Diagnosis present

## 2016-12-21 DIAGNOSIS — S0633AA Contusion and laceration of cerebrum, unspecified, with loss of consciousness status unknown, initial encounter: Secondary | ICD-10-CM

## 2016-12-21 DIAGNOSIS — F0390 Unspecified dementia without behavioral disturbance: Secondary | ICD-10-CM | POA: Diagnosis present

## 2016-12-21 DIAGNOSIS — Z794 Long term (current) use of insulin: Secondary | ICD-10-CM

## 2016-12-21 DIAGNOSIS — Z66 Do not resuscitate: Secondary | ICD-10-CM | POA: Diagnosis present

## 2016-12-21 DIAGNOSIS — G40909 Epilepsy, unspecified, not intractable, without status epilepticus: Secondary | ICD-10-CM | POA: Diagnosis present

## 2016-12-21 LAB — CBC WITH DIFFERENTIAL/PLATELET
Basophils Absolute: 0.1 10*3/uL (ref 0.0–0.1)
Basophils Relative: 1 %
EOS ABS: 0.2 10*3/uL (ref 0.0–0.7)
EOS PCT: 3 %
HCT: 40.4 % (ref 39.0–52.0)
HEMOGLOBIN: 13.2 g/dL (ref 13.0–17.0)
Lymphocytes Relative: 15 %
Lymphs Abs: 0.7 10*3/uL (ref 0.7–4.0)
MCH: 31.7 pg (ref 26.0–34.0)
MCHC: 32.7 g/dL (ref 30.0–36.0)
MCV: 97.1 fL (ref 78.0–100.0)
MONOS PCT: 12 %
Monocytes Absolute: 0.6 10*3/uL (ref 0.1–1.0)
NEUTROS PCT: 69 %
Neutro Abs: 3.4 10*3/uL (ref 1.7–7.7)
Platelets: 63 10*3/uL — ABNORMAL LOW (ref 150–400)
RBC: 4.16 MIL/uL — ABNORMAL LOW (ref 4.22–5.81)
RDW: 15.5 % (ref 11.5–15.5)
WBC: 4.9 10*3/uL (ref 4.0–10.5)

## 2016-12-21 LAB — COMPREHENSIVE METABOLIC PANEL
ALT: 22 U/L (ref 17–63)
ANION GAP: 7 (ref 5–15)
AST: 48 U/L — ABNORMAL HIGH (ref 15–41)
Albumin: 3.4 g/dL — ABNORMAL LOW (ref 3.5–5.0)
Alkaline Phosphatase: 103 U/L (ref 38–126)
BILIRUBIN TOTAL: 2.6 mg/dL — AB (ref 0.3–1.2)
BUN: 10 mg/dL (ref 6–20)
CALCIUM: 9.1 mg/dL (ref 8.9–10.3)
CO2: 30 mmol/L (ref 22–32)
Chloride: 101 mmol/L (ref 101–111)
Creatinine, Ser: 0.81 mg/dL (ref 0.61–1.24)
GFR calc Af Amer: >60 mL/min (ref >60)
Glucose, Bld: 101 mg/dL — ABNORMAL HIGH (ref 65–99)
POTASSIUM: 4.2 mmol/L (ref 3.5–5.1)
Sodium: 138 mmol/L (ref 135–145)
TOTAL PROTEIN: 6.8 g/dL (ref 6.5–8.1)

## 2016-12-21 LAB — URINALYSIS, ROUTINE W REFLEX MICROSCOPIC
BILIRUBIN URINE: NEGATIVE
Glucose, UA: NEGATIVE mg/dL
KETONES UR: NEGATIVE mg/dL
LEUKOCYTES UA: NEGATIVE
Nitrite: NEGATIVE
Protein, ur: NEGATIVE mg/dL
SPECIFIC GRAVITY, URINE: 1.012 (ref 1.005–1.030)
SQUAMOUS EPITHELIAL / LPF: NONE SEEN
pH: 6 (ref 5.0–8.0)

## 2016-12-21 LAB — RAPID URINE DRUG SCREEN, HOSP PERFORMED
Amphetamines: NOT DETECTED
BENZODIAZEPINES: POSITIVE — AB
Barbiturates: NOT DETECTED
COCAINE: NOT DETECTED
OPIATES: NOT DETECTED
Tetrahydrocannabinol: NOT DETECTED

## 2016-12-21 LAB — AMMONIA: AMMONIA: 54 umol/L — AB (ref 9–35)

## 2016-12-21 LAB — PROTIME-INR
INR: 1.33
PROTHROMBIN TIME: 16.6 s — AB (ref 11.4–15.2)

## 2016-12-21 LAB — MRSA PCR SCREENING: MRSA by PCR: NEGATIVE

## 2016-12-21 MED ORDER — CHLORHEXIDINE GLUCONATE 0.12 % MT SOLN
15.0000 mL | Freq: Two times a day (BID) | OROMUCOSAL | Status: DC
  Administered 2016-12-21 – 2017-01-01 (×21): 15 mL via OROMUCOSAL
  Filled 2016-12-21 (×21): qty 15

## 2016-12-21 MED ORDER — METOPROLOL TARTRATE 25 MG PO TABS
25.0000 mg | ORAL_TABLET | Freq: Every day | ORAL | Status: DC
  Administered 2016-12-24 – 2017-01-01 (×9): 25 mg via ORAL
  Filled 2016-12-21 (×10): qty 1

## 2016-12-21 MED ORDER — ALPRAZOLAM 0.25 MG PO TABS
0.2500 mg | ORAL_TABLET | Freq: Two times a day (BID) | ORAL | Status: DC | PRN
  Administered 2016-12-21 – 2016-12-29 (×5): 0.25 mg via ORAL
  Filled 2016-12-21 (×6): qty 1

## 2016-12-21 MED ORDER — METOPROLOL TARTRATE 50 MG PO TABS
50.0000 mg | ORAL_TABLET | Freq: Every day | ORAL | Status: DC
  Administered 2016-12-21 – 2017-01-01 (×9): 50 mg via ORAL
  Filled 2016-12-21 (×9): qty 1

## 2016-12-21 MED ORDER — PANTOPRAZOLE SODIUM 40 MG PO TBEC
40.0000 mg | DELAYED_RELEASE_TABLET | Freq: Every day | ORAL | Status: DC
  Administered 2016-12-21 – 2017-01-01 (×10): 40 mg via ORAL
  Filled 2016-12-21 (×10): qty 1

## 2016-12-21 MED ORDER — METOPROLOL TARTRATE 25 MG PO TABS: 25.0000 mg | ORAL_TABLET | Freq: Every day | ORAL | Status: DC

## 2016-12-21 MED ORDER — LACTULOSE 10 GM/15ML PO SOLN
40.0000 g | Freq: Three times a day (TID) | ORAL | Status: DC
  Administered 2016-12-21 (×2): 40 g via ORAL
  Filled 2016-12-21: qty 60

## 2016-12-21 MED ORDER — ADULT MULTIVITAMIN W/MINERALS CH
1.0000 | ORAL_TABLET | Freq: Every day | ORAL | Status: DC
  Administered 2016-12-21 – 2017-01-01 (×9): 1 via ORAL
  Filled 2016-12-21 (×10): qty 1

## 2016-12-21 MED ORDER — ORAL CARE MOUTH RINSE
15.0000 mL | Freq: Two times a day (BID) | OROMUCOSAL | Status: DC
  Administered 2016-12-21 – 2016-12-31 (×15): 15 mL via OROMUCOSAL

## 2016-12-21 MED ORDER — LACTULOSE 10 GM/15ML PO SOLN
40.0000 g | ORAL | Status: AC
  Administered 2016-12-21: 40 g via ORAL
  Filled 2016-12-21: qty 60

## 2016-12-21 MED ORDER — METOPROLOL TARTRATE 25 MG PO TABS: 25.0000 mg | ORAL_TABLET | Freq: Three times a day (TID) | ORAL | Status: DC

## 2016-12-21 MED ORDER — NYSTATIN 100000 UNIT/GM EX POWD
Freq: Three times a day (TID) | CUTANEOUS | Status: DC
  Administered 2016-12-21 – 2017-01-01 (×33): via TOPICAL
  Filled 2016-12-21 (×7): qty 15

## 2016-12-21 MED ORDER — POTASSIUM CHLORIDE CRYS ER 20 MEQ PO TBCR
20.0000 meq | EXTENDED_RELEASE_TABLET | Freq: Two times a day (BID) | ORAL | Status: DC
  Administered 2016-12-21 – 2017-01-01 (×18): 20 meq via ORAL
  Filled 2016-12-21 (×19): qty 1

## 2016-12-21 MED ORDER — FLUCONAZOLE 100 MG PO TABS
100.0000 mg | ORAL_TABLET | Freq: Every day | ORAL | Status: DC
  Administered 2016-12-21 – 2017-01-01 (×10): 100 mg via ORAL
  Filled 2016-12-21 (×10): qty 1

## 2016-12-21 MED ORDER — EZETIMIBE 10 MG PO TABS
10.0000 mg | ORAL_TABLET | Freq: Every day | ORAL | Status: DC
  Administered 2016-12-21 – 2017-01-01 (×10): 10 mg via ORAL
  Filled 2016-12-21 (×10): qty 1

## 2016-12-21 NOTE — ED Provider Notes (Signed)
[facility] DEPT Provider Note   CSN: [id number] Arrival date & time: 12/21/16  1111 By signing my name below, I, Gaspar Cola, attest that this documentation has been prepared under the direction and in the presence of Davonna Belling, MD . Electronically Signed: Gaspar Cola Scribe. 12/21/2016. 12:24 PM History   Chief Complaint Chief Complaint  Patient presents with  . Fall     The history is provided by the patient and the EMS personnel (nursing note). The history is limited by the condition of the patient. No language interpreter was used.   LEVEL 5 CAVEAT: POOR HISTORIANAnd mental status change HPI Comments: [name] [name] is a 56 y.o. male brought in by ambulance, who presents to the Emergency Department s/p fall this morning. Per nursing note, patient struck the right side of his head and wife noted he has had frequent falls recently due to taking too much Xanax. Patient notes he has a HA currently but unable to glean much other information about his condition. He has a history of NASH and was discharged 4 days ago; has worsening encephalopathy due to non-compliance.   Past Medical History:  Diagnosis Date  . Anemia   . Anxiety   . Arthritis   . Bipolar 1 disorder ([place])   . Blind right eye   . Blood clots in brain    per patient  . Cardiomegaly   . Cataract    Right eye  . Cerebrovascular disease    Right vertebral artery  . Cholelithiasis    Asymptomatic  . Chronic back pain   . Cirrhosis ([place])    suspected NASH  . Cirrhosis ([place])    diagnosed 07/2010 when presented with first episode of hepatic encephalopathy, afp on 416/13=4.8,  U/S on 12/14/11  liver stable, pt has received 2 hep A/B vaccines  . COPD (chronic obstructive pulmonary disease) ([place])   . Depression   . Diabetes mellitus   . Diverticulosis   . GERD (gastroesophageal reflux disease)   . Hard of hearing   . Hepatic encephalopathy ([place])   . Hyperlipidemia   . Hypertension   . Mental  retardation   . Seizures ([place])   . Thrombocytopenia [facility])     Patient Active Problem List   Diagnosis Date Noted  . Pressure injury of skin 12/14/2016  . Anxiety   . Suicidal ideation   . Altered mental state 12/07/2015  . Acute hepatic encephalopathy ([place]) 08/02/2013  . Hypokalemia 02/01/2013  . Hyperammonemia ([place]) 01/31/2013  . Urinary incontinence 01/31/2013  . Pancytopenia ([place]) 09/17/2012  . Edema of right lower extremity 04/28/2012  . Hepatic encephalopathy ([place]) 04/28/2012  . Arthritis of ankle or foot, degenerative 04/26/2012  . Hematemesis 12/07/2011  . Thrombocytopenia ([place]) 12/07/2011  . Anemia 12/07/2011  . COPD (chronic obstructive pulmonary disease) ([place]) 08/25/2011  . DM (diabetes mellitus) ([place]) 08/25/2011  . HTN (hypertension) 08/25/2011  . Coagulopathy ([place]) 08/25/2011  . Cirrhosis ([place]) 08/25/2011  . Depression 08/25/2011  . Seizure disorder ([place]) 08/25/2011  . GERD (gastroesophageal reflux disease) 08/25/2011  . Tobacco abuse 08/25/2011  . Ankle fracture, bimalleolar, closed 08/04/2011    Past Surgical History:  Procedure Laterality Date  . CAST APPLICATION  09/26/4008   Procedure: MINOR CAST APPLICATION;  Surgeon: [name], MD;  Location: [facility] OR[facility];  Service: Orthopedics;  Laterality: Right;  no anesthesia , procedure room do not need or room !  . COLONOSCOPY  12/15/11   colonic divericulosis;poor prep, ACBE  recommended but has not been  done  . ear operations    . ESOPHAGOGASTRODUODENOSCOPY  02/02/2011   [place]-Normal esophagus.  No varices/Question mild portal gastropathy, extrinsic compression on the antrum, lesser curvature of uncertain significance, some minimally  nodular mucosa status post biopsy, patent pylorus, normal duodenum 1 and duodenum 2.  . ESOPHAGOGASTRODUODENOSCOPY  12/15/11   [place]k-->mild changes of portal gastropathy, gastric/duodenal erosions s/p bx  (reactive changes with mild chronic inflammation. No H.pylori  . FOOT SURGERY      . ORIF ANKLE FRACTURE  08/27/2011   Procedure: OPEN REDUCTION INTERNAL FIXATION (ORIF) ANKLE FRACTURE;  Surgeon: [name], MD;  Location: [facility];  Service: Orthopedics;  Laterality: Right;  . TONSILLECTOMY       Home Medications    Prior to Admission medications   Medication Sig Start Date End Date Taking? Authorizing Provider  ALPRAZolam Duanne Moron) 0.5 MG tablet Take 0.5 mg by mouth 3 (three) times daily as needed for anxiety.   Yes Historical Provider, MD  ezetimibe (ZETIA) 10 MG tablet Take 10 mg by mouth daily.   Yes Historical Provider, MD  gabapentin (NEURONTIN) 300 MG capsule Take 300 mg by mouth 3 (three) times daily.   Yes Historical Provider, MD  metoprolol tartrate (LOPRESSOR) 25 MG tablet Take 25 mg by mouth 3 (three) times daily. Take 25 mg in the morning and 50 mg at night   Yes Historical Provider, MD  omeprazole (PRILOSEC) 20 MG capsule Take 20 mg by mouth daily.   Yes Historical Provider, MD  potassium chloride SA (K-DUR,KLOR-CON) 20 MEQ tablet Take 20 mEq by mouth 2 (two) times daily.   Yes Historical Provider, MD  aspirin EC 81 MG tablet Take 81 mg by mouth daily.    Historical Provider, MD  fish oil-omega-3 fatty acids 1000 MG capsule Take 1 g by mouth 3 (three) times daily.    Historical Provider, MD  Insulin Detemir (LEVEMIR FLEXPEN) 100 UNIT/ML Pen Inject into the skin daily at 10 pm.    Historical Provider, MD  lactulose (CHRONULAC) 10 GM/15ML solution Take 60 mLs (40 g total) by mouth 3 (three) times daily. Titrate for 3-4 bowel motions daily. 12/15/16   Silver Huguenin Elgergawy, MD  Multiple Vitamin (MULTIVITAMIN WITH MINERALS) TABS Take 1 tablet by mouth daily.    Historical Provider, MD    Family History Family History  Problem Relation Age of Onset  . Heart failure Mother   . Thyroid disease Mother   . Cancer Father   . Asthma Brother   . Heart attack Brother   . Cirrhosis Brother     EtOH  . Colon cancer Maternal Aunt     Social History Social History   Substance Use Topics  . Smoking status: Current Every Day Smoker    Packs/day: 1.00    Years: 30.00    Types: Cigarettes  . Smokeless tobacco: Never Used     Comment: only smokes about 1-2 a day  . Alcohol use No     Allergies   Penicillins  Review of Systems Review of Systems  Unable to perform ROS: Other   Physical Exam Updated Vital Signs BP (!) 151/72   Pulse 60   Temp 98.3 F (36.8 C) (Oral)   Resp 13   Ht 5' 8"  (1.727 m)   Wt 250 lb (113.4 kg)   SpO2 97%   BMI 38.01 kg/m   Physical Exam  Constitutional: He appears well-developed and well-nourished.  HENT:  Head: Normocephalic.  1 cm curved laceration with small  hematoma to the right parietal scalp.  Eyes:  Right eye is cloudy, appears to be chronic. Left eye is normal and reactive to light.   Neck: Neck supple.  Cardiovascular: Normal rate and regular rhythm.   No murmur heard. Pulmonary/Chest: Effort normal. No respiratory distress. He has wheezes (diffuse). He has rales.  Abdominal: Soft. There is no tenderness. There is no rebound and no guarding.  Musculoskeletal: He exhibits no edema or tenderness.  No tenderness over extremities. No peripheral edema   Neurological: He is alert.  Confused. Answers questions but unable to fully understand  Skin: Skin is warm and dry.  Chronic skin changes (peeling) diffuse  Psychiatric: He has a normal mood and affect.  Nursing note and vitals reviewed.  ED Treatments / Results   Labs (all labs ordered are listed, but only abnormal results are displayed) Labs Reviewed  COMPREHENSIVE METABOLIC PANEL - Abnormal; Notable for the following:       Result Value   Glucose, Bld 101 (*)    Albumin 3.4 (*)    AST 48 (*)    Total Bilirubin 2.6 (*)    All other components within normal limits  PROTIME-INR - Abnormal; Notable for the following:    Prothrombin Time 16.6 (*)    All other components within normal limits  CBC WITH DIFFERENTIAL/PLATELET - Abnormal; Notable  for the following:    RBC 4.16 (*)    Platelets 63 (*)    All other components within normal limits  AMMONIA - Abnormal; Notable for the following:    Ammonia 54 (*)    All other components within normal limits  URINALYSIS, ROUTINE W REFLEX MICROSCOPIC  RAPID URINE DRUG SCREEN, HOSP PERFORMED    EKG  EKG Interpretation  Date/Time:  Tuesday Dec 21 2016 11:16:18 EDT Ventricular Rate:  61 PR Interval:    QRS Duration: 92 QT Interval:  430 QTC Calculation: 434 R Axis:   72 Text Interpretation:  Sinus rhythm No significant change since last tracing Confirmed by Alvino Chapel  MD, Ovid Curd [phone]) on 12/21/2016 12:20:30 PM       Radiology Ct Head Wo Contrast  Result Date: 12/21/2016 CLINICAL DATA:  Fall from bed this morning EXAM: CT HEAD WITHOUT CONTRAST TECHNIQUE: Contiguous axial images were obtained from the base of the skull through the vertex without intravenous contrast. COMPARISON:  12/13/2016 FINDINGS: Brain: Focal area of increased attenuation is noted along the medial aspect of the left frontoparietal region just above the corpus callosum. This likely represents a small area of contusion. No subarachnoid hemorrhage or extra-axial hemorrhage is seen. Mild atrophic changes are noted. Vascular: No hyperdense vessel or unexpected calcification. Skull: Changes of prior mastoidectomies bilaterally. Sinuses/Orbits: Chronic changes are noted in the orbits bilaterally. Other: Mild soft tissue swelling is noted near the vertex posteriorly consistent with the recent injury. IMPRESSION: Scalp hematoma consistent with the recent injury. Area of increased attenuation along the medial aspect of the left cerebral hemisphere adjacent to the falx best seen on image number 24 series 2. This is consistent with a mild area of parenchymal contusion. Short-term follow-up examination is recommended. Electronically Signed   By: Inez Catalina M.D.   On: 12/21/2016 13:20   Dg Chest Portable 1 View  Result Date:  12/21/2016 CLINICAL DATA:  Status post fall from bed this morning. History of COPD, diabetes, current smoker. EXAM: PORTABLE CHEST 1 VIEW COMPARISON:  Chest x-ray of December 13, 2016 FINDINGS: The lungs are adequately inflated. The interstitial markings are mildly  prominent bilaterally and more conspicuous today. The cardiac silhouette is enlarged and the pulmonary vascularity is more engorged today. There is calcification in the wall of the aortic arch. The mediastinum is normal in width. There is no pleural effusion. The bony thorax exhibits no acute abnormality. IMPRESSION: COPD with superimposed mild CHF. No focal pneumonia. The observed bony structures are unremarkable. Electronically Signed   By: David  Martinique M.D.   On: 12/21/2016 12:29    Procedures Procedures (including critical care time)  Medications Ordered in ED Medications - No data to display   Initial Impression / Assessment and Plan / ED Course  I have reviewed the triage vital signs and the nursing notes.  Pertinent labs & imaging results that were available during my care of the patient were reviewed by me and considered in my medical decision making (see chart for details).     Patient with mental status changes. Family members later arrived and said that he has really not improved back to his baseline over the last week. He had improved his mental status when he was discharged from the hospital on 25th but then quickly decreases mental status again. Reportedly fell out of bed last night. Does have slight cranial contusion. Discussed with Dr. Cyndy Freeze from neurosurgery. Nothing surgical and is okay to stay up at [facility]. Could however be a concussion with this also. But I think likely the mental status changes or from hepatic encephalopathy. Will admit to internal medicine.  Final Clinical Impressions(s) / ED Diagnoses   Final diagnoses:  Fall, initial encounter  Hepatic encephalopathy ([place])  Contusion of left cerebral  hemisphere without loss of consciousness, initial encounter [facility])    New Prescriptions New Prescriptions   No medications on file  I personally performed the services described in this documentation, which was scribed in my presence. The recorded information has been reviewed and is accurate.   CRITICAL CARE Performed by: Mackie Pai Total critical care time: 30 minutes Critical care time was exclusive of separately billable procedures and treating other patients. Critical care was necessary to treat or prevent imminent or life-threatening deterioration. Critical care was time spent personally by me on the following activities: development of treatment plan with patient and/or surrogate as well as nursing, discussions with consultants, evaluation of patient's response to treatment, examination of patient, obtaining history from patient or surrogate, ordering and performing treatments and interventions, ordering and review of laboratory studies, ordering and review of radiographic studies, pulse oximetry and re-evaluation of patient's condition.     Davonna Belling, MD 12/21/16 1430

## 2016-12-21 NOTE — H&P (Signed)
TRH H&P   Patient Demographics:    [name] [name], is a 56 y.o. male  MRN: [id number]   DOB - September 01, 1960  Admit Date - 12/21/2016  Outpatient Primary MD for the patient is Hermine Messick, MD  Referring MD/NP/PA: Dr Alvino Chapel  Patient coming from: Home  Chief Complaint  Patient presents with  . Fall      HPI:    [name] [name]  is a 56 y.o. male, with h/o NASH cirrhosis and repeated episodes of hepatic encephalopathy due to medication noncompliance, DM, seizure disorder, Patient with recent hospitalization secondary to acute hepatic encephalopathy from noncompliance with lactulose, patient is altered, history was obtained from wife via phone, patient has been more confused one day after discharge, been having multiple falls over last 4 days, as well more confused, wife reports patient was compliant with medication, taking his daily dose  lactulose with average  4-5 bowel movements per day, had another fall today, which prompted her to call EMS, workup was significant for ammonia of 54, white count of 63, and CT head significant for cerebral contusion, she reports patient does not take any lactulose today, as well had no bowel movement, ED discussed with neurosurgery on call, who recommended no further workup at this point, I was called to admit.    Review of systems:    Tried to obtain, unable given his mental status   With Past History of the following :    Past Medical History:  Diagnosis Date  . Anemia   . Anxiety   . Arthritis   . Bipolar 1 disorder ([place])   . Blind right eye   . Blood clots in brain    per patient  . Cardiomegaly   . Cataract    Right eye  . Cerebrovascular disease    Right vertebral artery  . Cholelithiasis    Asymptomatic  . Chronic back pain   . Cirrhosis ([place])    suspected NASH  . Cirrhosis ([place])    diagnosed 07/2010 when presented with  first episode of hepatic encephalopathy, afp on 416/13=4.8,  U/S on 12/14/11  liver stable, pt has received 2 hep A/B vaccines  . COPD (chronic obstructive pulmonary disease) ([place])   . Depression   . Diabetes mellitus   . Diverticulosis   . GERD (gastroesophageal reflux disease)   . Hard of hearing   . Hepatic encephalopathy ([place])   . Hyperlipidemia   . Hypertension   . Mental retardation   . Seizures ([place])   . Thrombocytopenia ([place])       Past Surgical History:  Procedure Laterality Date  . CAST APPLICATION  8/0/0349   Procedure: MINOR CAST APPLICATION;  Surgeon: [name], MD;  Location: [facility];  Service: Orthopedics;  Laterality: Right;  no anesthesia , procedure room do not need or room !  . COLONOSCOPY  12/15/11   colonic divericulosis;poor prep, ACBE  recommended but has not been done  . ear operations    . ESOPHAGOGASTRODUODENOSCOPY  02/02/2011   [place]-Normal esophagus.  No varices/Question mild portal gastropathy, extrinsic compression on the antrum, lesser curvature of uncertain significance, some minimally  nodular mucosa status post biopsy, patent pylorus, normal duodenum 1 and duodenum 2.  . ESOPHAGOGASTRODUODENOSCOPY  12/15/11   [place]ourk-->mild changes of portal gastropathy, gastric/duodenal erosions s/p bx  (reactive changes with mild chronic inflammation. No H.pylori  . FOOT SURGERY    . ORIF ANKLE FRACTURE  08/27/2011   Procedure: OPEN REDUCTION INTERNAL FIXATION (ORIF) ANKLE FRACTURE;  Surgeon: [name], MD;  Location: [facility];  Service: Orthopedics;  Laterality: Right;  . TONSILLECTOMY        Social History:     Social History  Substance Use Topics  . Smoking status: Current Every Day Smoker    Packs/day: 1.00    Years: 30.00    Types: Cigarettes  . Smokeless tobacco: Never Used     Comment: only smokes about 1-2 a day  . Alcohol use No     Lives - At home  Mobility - with assistance    Family History :     Family History  Problem  Relation Age of Onset  . Heart failure Mother   . Thyroid disease Mother   . Cancer Father   . Asthma Brother   . Heart attack Brother   . Cirrhosis Brother     EtOH  . Colon cancer Maternal Aunt       Home Medications:   Prior to Admission medications   Medication Sig Start Date End Date Taking? Authorizing Provider  ALPRAZolam Duanne Moron) 0.5 MG tablet Take 0.5 mg by mouth 3 (three) times daily as needed for anxiety.   Yes Historical Provider, MD  aspirin EC 81 MG tablet Take 81 mg by mouth daily.   Yes Historical Provider, MD  ezetimibe (ZETIA) 10 MG tablet Take 10 mg by mouth daily.   Yes Historical Provider, MD  gabapentin (NEURONTIN) 300 MG capsule Take 300 mg by mouth 3 (three) times daily.   Yes Historical Provider, MD  lactulose (CHRONULAC) 10 GM/15ML solution Take 60 mLs (40 g total) by mouth 3 (three) times daily. Titrate for 3-4 bowel motions daily. 12/15/16  Yes Albertine Patricia, MD  metoprolol tartrate (LOPRESSOR) 25 MG tablet Take 25 mg by mouth 3 (three) times daily. Take 25 mg in the morning and 50 mg at night   Yes Historical Provider, MD  Multiple Vitamin (MULTIVITAMIN WITH MINERALS) TABS Take 1 tablet by mouth daily.   Yes Historical Provider, MD  omeprazole (PRILOSEC) 20 MG capsule Take 20 mg by mouth daily.   Yes Historical Provider, MD  potassium chloride SA (K-DUR,KLOR-CON) 20 MEQ tablet Take 20 mEq by mouth 2 (two) times daily.   Yes Historical Provider, MD     Allergies:     Allergies  Allergen Reactions  . Penicillins Rash    Has patient had a PCN reaction causing immediate rash, facial/tongue/throat swelling, SOB or lightheadedness with hypotension: Yes Has patient had a PCN reaction causing severe rash involving mucus membranes or skin necrosis: No Has patient had a PCN reaction that required hospitalization No Has patient had a PCN reaction occurring within the last 10 years: No If all of the above answers are "NO", then may proceed with Cephalosporin  use.      Physical Exam:   Vitals  Blood pressure (!) 163/79, pulse 63, temperature 98.3 F (  36.8 C), temperature source Oral, resp. rate 15, height 5' 8"  (1.727 m), weight 113.4 kg (250 lb), SpO2 97 %.   1. General Ill-appearing male lying in bed in NAD,    2. Confused, and appropriate, answering only to name,   3. Neurologic exam was limited secondary to mental status, but overall no focal deficits could be appreciated, motor strength grossly intac in all extremities  4. Ears  appear Normal, right eye blindness. Moist Oral Mucosa. Bruise in the right posterior and midline occipital  5. Supple Neck, No JVD, No cervical lymphadenopathy appriciated, No Carotid Bruits.  6. Symmetrical Chest wall movement, Good air movement bilaterally, CTAB.  7. RRR, No Gallops, Rubs or Murmurs, No Parasternal Heave.  8. Positive Bowel Sounds, Abdomen Soft, No tenderness, No organomegaly appriciated,No rebound -guarding or rigidity.  9.  No Cyanosis, Normal Skin Turgor, bruising of the right arm and hip from fall, has fungal skin rash in groin area  10. Good muscle tone,  joints appear normal , no effusions, Normal ROM.  11. No Palpable Lymph Nodes in Neck or Axillae     Data Review:    CBC  Recent Labs Lab 12/21/16 1125  WBC 4.9  HGB 13.2  HCT 40.4  PLT 63*  MCV 97.1  MCH 31.7  MCHC 32.7  RDW 15.5  LYMPHSABS 0.7  MONOABS 0.6  EOSABS 0.2  BASOSABS 0.1   ------------------------------------------------------------------------------------------------------------------  Chemistries   Recent Labs Lab 12/21/16 1125  NA 138  K 4.2  CL 101  CO2 30  GLUCOSE 101*  BUN 10  CREATININE 0.81  CALCIUM 9.1  AST 48*  ALT 22  ALKPHOS 103  BILITOT 2.6*   ------------------------------------------------------------------------------------------------------------------ estimated creatinine clearance is 123 mL/min (by C-G formula based on SCr of 0.81  mg/dL). ------------------------------------------------------------------------------------------------------------------ No results for input(s): TSH, T4TOTAL, T3FREE, THYROIDAB in the last 72 hours.  Invalid input(s): FREET3  Coagulation profile  Recent Labs Lab 12/21/16 1125  INR 1.33   ------------------------------------------------------------------------------------------------------------------- No results for input(s): DDIMER in the last 72 hours. -------------------------------------------------------------------------------------------------------------------  Cardiac Enzymes No results for input(s): CKMB, TROPONINI, MYOGLOBIN in the last 168 hours.  Invalid input(s): CK ------------------------------------------------------------------------------------------------------------------ No results found for: BNP   ---------------------------------------------------------------------------------------------------------------  Urinalysis    Component Value Date/Time   COLORURINE YELLOW 12/21/2016 [place] 12/21/2016 1211   LABSPEC 1.012 12/21/2016 1211   PHURINE 6.0 12/21/2016 1211   GLUCOSEU NEGATIVE 12/21/2016 1211   HGBUR MODERATE (A) 12/21/2016 1211   BILIRUBINUR NEGATIVE 12/21/2016 1211   KETONESUR NEGATIVE 12/21/2016 1211   PROTEINUR NEGATIVE 12/21/2016 1211   UROBILINOGEN 1.0 01/03/2014 1748   NITRITE NEGATIVE 12/21/2016 1211   LEUKOCYTESUR NEGATIVE 12/21/2016 1211    ----------------------------------------------------------------------------------------------------------------   Imaging Results:    Ct Head Wo Contrast  Result Date: 12/21/2016 CLINICAL DATA:  Fall from bed this morning EXAM: CT HEAD WITHOUT CONTRAST TECHNIQUE: Contiguous axial images were obtained from the base of the skull through the vertex without intravenous contrast. COMPARISON:  12/13/2016 FINDINGS: Brain: Focal area of increased attenuation is noted along the  medial aspect of the left frontoparietal region just above the corpus callosum. This likely represents a small area of contusion. No subarachnoid hemorrhage or extra-axial hemorrhage is seen. Mild atrophic changes are noted. Vascular: No hyperdense vessel or unexpected calcification. Skull: Changes of prior mastoidectomies bilaterally. Sinuses/Orbits: Chronic changes are noted in the orbits bilaterally. Other: Mild soft tissue swelling is noted near the vertex posteriorly consistent with the recent injury. IMPRESSION: Scalp hematoma consistent  with the recent injury. Area of increased attenuation along the medial aspect of the left cerebral hemisphere adjacent to the falx best seen on image number 24 series 2. This is consistent with a mild area of parenchymal contusion. Short-term follow-up examination is recommended. Electronically Signed   By: Inez Catalina M.D.   On: 12/21/2016 13:20   Dg Chest Portable 1 View  Result Date: 12/21/2016 CLINICAL DATA:  Status post fall from bed this morning. History of COPD, diabetes, current smoker. EXAM: PORTABLE CHEST 1 VIEW COMPARISON:  Chest x-ray of December 13, 2016 FINDINGS: The lungs are adequately inflated. The interstitial markings are mildly prominent bilaterally and more conspicuous today. The cardiac silhouette is enlarged and the pulmonary vascularity is more engorged today. There is calcification in the wall of the aortic arch. The mediastinum is normal in width. There is no pleural effusion. The bony thorax exhibits no acute abnormality. IMPRESSION: COPD with superimposed mild CHF. No focal pneumonia. The observed bony structures are unremarkable. Electronically Signed   By: David  Martinique M.D.   On: 12/21/2016 12:29      Assessment & Plan:    Active Problems:   Cirrhosis (HCC)   Thrombocytopenia ([facility])   Hyperammonemia ([facility])   Altered mental state   Anxiety   Fall   Cerebral contusion ([place])   Encephalopathy   Acute encephalopathy - Hepatic  encephalopathy versus medication induced, unlikely related to cerebral contusion, as patient is confused before fall. - Patient on significant dose of Xanax, wife supervising him taken it, and he takes it appropriately, discussed with her, will decrease his home dose Xanax to 0.25 mg twice a day when necessary (was on 0.5 mg 3 times a day when necessary) - she Reports patient  been compliant with his lactulose, but didn't take any today, so we'll give him one dose now, continue and monitor ammonia level closely.  Cerebral contusion - Will admit to stepdown, continue with neuro checks every 2 hours, ED physician discussed with neurosurgery on call - We'll hold aspirin  Hyperammonemia -As resume on lactulose, and recheck ammonia level in a.m.   [place] cirrhosis - Follow with GI as an outpatient  Thrombocytopenia - At baseline secondary to liver disease  Hypertension - Continue with home medication  Diabetes mellitus - Will start an insulin sliding scale  Multiple falls - Secondary to generalized weakness, encephalopathy, likely will need SNF placement  DVT Prophylaxis SCDs   AM Labs Ordered, also please review Full Orders  Family Communication: Admission, patients condition and plan of care including tests being ordered have been discussed with the wife over phone who indicate understanding and agree with the plan and Code Status.  Code Status Full  Likely DC to : Pending further workup and PT consult, likely will need SNF   Condition GUARDED    Consults called: none   Admission status: observation   Time spent in minutes : 55 minutes   Mack Thurmon M.D on 12/21/2016 at 4:10 PM  Between 7am to 7pm - Pager - [phone]. After 7pm go to [email or web address] - password [facility]  [facility] - Office  [phone]

## 2016-12-21 NOTE — ED Triage Notes (Signed)
Patient brought in by EMS, states he fell out of bed this morning, hitting the right side of his head. Patient has abrasion noted to right side of head. No bleeding noted at this time. Per EMS, wife states patient has been falling "a lot lately" and she thinks he is taking too many xanax. Patient's speech is slurred and he is drowsy at triage.

## 2016-12-21 NOTE — ED Notes (Signed)
Per EMS, patient was recently admitted to hospital and wife states the physician wanted to admit patient to rehab facility but patient refused.

## 2016-12-22 ENCOUNTER — Inpatient Hospital Stay (HOSPITAL_COMMUNITY): Payer: Medicare HMO

## 2016-12-22 DIAGNOSIS — S066X9A Traumatic subarachnoid hemorrhage with loss of consciousness of unspecified duration, initial encounter: Secondary | ICD-10-CM | POA: Diagnosis present

## 2016-12-22 DIAGNOSIS — K219 Gastro-esophageal reflux disease without esophagitis: Secondary | ICD-10-CM | POA: Diagnosis present

## 2016-12-22 DIAGNOSIS — E119 Type 2 diabetes mellitus without complications: Secondary | ICD-10-CM | POA: Diagnosis present

## 2016-12-22 DIAGNOSIS — E7220 Disorder of urea cycle metabolism, unspecified: Secondary | ICD-10-CM | POA: Diagnosis not present

## 2016-12-22 DIAGNOSIS — I609 Nontraumatic subarachnoid hemorrhage, unspecified: Secondary | ICD-10-CM | POA: Diagnosis not present

## 2016-12-22 DIAGNOSIS — F0391 Unspecified dementia with behavioral disturbance: Secondary | ICD-10-CM | POA: Diagnosis not present

## 2016-12-22 DIAGNOSIS — I10 Essential (primary) hypertension: Secondary | ICD-10-CM

## 2016-12-22 DIAGNOSIS — F17210 Nicotine dependence, cigarettes, uncomplicated: Secondary | ICD-10-CM | POA: Diagnosis present

## 2016-12-22 DIAGNOSIS — R4587 Impulsiveness: Secondary | ICD-10-CM | POA: Diagnosis present

## 2016-12-22 DIAGNOSIS — S06320A Contusion and laceration of left cerebrum without loss of consciousness, initial encounter: Secondary | ICD-10-CM | POA: Diagnosis not present

## 2016-12-22 DIAGNOSIS — E669 Obesity, unspecified: Secondary | ICD-10-CM | POA: Diagnosis present

## 2016-12-22 DIAGNOSIS — J449 Chronic obstructive pulmonary disease, unspecified: Secondary | ICD-10-CM

## 2016-12-22 DIAGNOSIS — K7460 Unspecified cirrhosis of liver: Secondary | ICD-10-CM | POA: Diagnosis present

## 2016-12-22 DIAGNOSIS — E785 Hyperlipidemia, unspecified: Secondary | ICD-10-CM | POA: Diagnosis present

## 2016-12-22 DIAGNOSIS — R7881 Bacteremia: Secondary | ICD-10-CM | POA: Diagnosis not present

## 2016-12-22 DIAGNOSIS — H9190 Unspecified hearing loss, unspecified ear: Secondary | ICD-10-CM | POA: Diagnosis present

## 2016-12-22 DIAGNOSIS — M549 Dorsalgia, unspecified: Secondary | ICD-10-CM | POA: Diagnosis present

## 2016-12-22 DIAGNOSIS — F79 Unspecified intellectual disabilities: Secondary | ICD-10-CM | POA: Diagnosis present

## 2016-12-22 DIAGNOSIS — W19XXXA Unspecified fall, initial encounter: Secondary | ICD-10-CM | POA: Diagnosis not present

## 2016-12-22 DIAGNOSIS — D696 Thrombocytopenia, unspecified: Secondary | ICD-10-CM | POA: Diagnosis present

## 2016-12-22 DIAGNOSIS — S06330A Contusion and laceration of cerebrum, unspecified, without loss of consciousness, initial encounter: Secondary | ICD-10-CM | POA: Diagnosis not present

## 2016-12-22 DIAGNOSIS — K7581 Nonalcoholic steatohepatitis (NASH): Secondary | ICD-10-CM | POA: Diagnosis present

## 2016-12-22 DIAGNOSIS — K8020 Calculus of gallbladder without cholecystitis without obstruction: Secondary | ICD-10-CM | POA: Diagnosis present

## 2016-12-22 DIAGNOSIS — K7290 Hepatic failure, unspecified without coma: Principal | ICD-10-CM

## 2016-12-22 DIAGNOSIS — R296 Repeated falls: Secondary | ICD-10-CM | POA: Diagnosis present

## 2016-12-22 DIAGNOSIS — G9340 Encephalopathy, unspecified: Secondary | ICD-10-CM | POA: Diagnosis not present

## 2016-12-22 DIAGNOSIS — Z66 Do not resuscitate: Secondary | ICD-10-CM | POA: Diagnosis present

## 2016-12-22 DIAGNOSIS — W06XXXA Fall from bed, initial encounter: Secondary | ICD-10-CM | POA: Diagnosis present

## 2016-12-22 DIAGNOSIS — F319 Bipolar disorder, unspecified: Secondary | ICD-10-CM | POA: Diagnosis present

## 2016-12-22 DIAGNOSIS — G40909 Epilepsy, unspecified, not intractable, without status epilepticus: Secondary | ICD-10-CM | POA: Diagnosis present

## 2016-12-22 DIAGNOSIS — G8929 Other chronic pain: Secondary | ICD-10-CM | POA: Diagnosis present

## 2016-12-22 DIAGNOSIS — F419 Anxiety disorder, unspecified: Secondary | ICD-10-CM | POA: Diagnosis present

## 2016-12-22 DIAGNOSIS — K7469 Other cirrhosis of liver: Secondary | ICD-10-CM | POA: Diagnosis not present

## 2016-12-22 LAB — CBC
HEMATOCRIT: 38 % — AB (ref 39.0–52.0)
Hemoglobin: 12.5 g/dL — ABNORMAL LOW (ref 13.0–17.0)
MCH: 31.6 pg (ref 26.0–34.0)
MCHC: 32.9 g/dL (ref 30.0–36.0)
MCV: 96 fL (ref 78.0–100.0)
Platelets: 59 10*3/uL — ABNORMAL LOW (ref 150–400)
RBC: 3.96 MIL/uL — AB (ref 4.22–5.81)
RDW: 15.7 % — ABNORMAL HIGH (ref 11.5–15.5)
WBC: 7.9 10*3/uL (ref 4.0–10.5)

## 2016-12-22 LAB — BLOOD GAS, ARTERIAL
Acid-Base Excess: 2.7 mmol/L — ABNORMAL HIGH (ref 0.0–2.0)
Bicarbonate: 26.8 mmol/L (ref 20.0–28.0)
DRAWN BY: 234301
FIO2: 21
O2 Saturation: 90.8 %
PH ART: 7.454 — AB (ref 7.350–7.450)
pCO2 arterial: 38.1 mmHg (ref 32.0–48.0)
pO2, Arterial: 61.5 mmHg — ABNORMAL LOW (ref 83.0–108.0)

## 2016-12-22 LAB — BASIC METABOLIC PANEL
ANION GAP: 8 (ref 5–15)
BUN: 11 mg/dL (ref 6–20)
CHLORIDE: 104 mmol/L (ref 101–111)
CO2: 24 mmol/L (ref 22–32)
Calcium: 8.6 mg/dL — ABNORMAL LOW (ref 8.9–10.3)
Creatinine, Ser: 0.72 mg/dL (ref 0.61–1.24)
GFR calc Af Amer: >60 mL/min (ref >60)
GLUCOSE: 105 mg/dL — AB (ref 65–99)
POTASSIUM: 3.9 mmol/L (ref 3.5–5.1)
Sodium: 136 mmol/L (ref 135–145)

## 2016-12-22 LAB — AMMONIA: Ammonia: 56 umol/L — ABNORMAL HIGH (ref 9–35)

## 2016-12-22 LAB — GLUCOSE, CAPILLARY: Glucose-Capillary: 106 mg/dL — ABNORMAL HIGH (ref 65–99)

## 2016-12-22 MED ORDER — VANCOMYCIN HCL IN DEXTROSE 1-5 GM/200ML-% IV SOLN
1000.0000 mg | INTRAVENOUS | Status: AC
  Administered 2016-12-22 (×2): 1000 mg via INTRAVENOUS
  Filled 2016-12-22 (×2): qty 200

## 2016-12-22 MED ORDER — IPRATROPIUM-ALBUTEROL 0.5-2.5 (3) MG/3ML IN SOLN
3.0000 mL | Freq: Four times a day (QID) | RESPIRATORY_TRACT | Status: DC
  Administered 2016-12-22 – 2016-12-24 (×10): 3 mL via RESPIRATORY_TRACT
  Filled 2016-12-22 (×10): qty 3

## 2016-12-22 MED ORDER — SODIUM CHLORIDE 0.9 % IV SOLN
1.0000 g | Freq: Three times a day (TID) | INTRAVENOUS | Status: DC
  Administered 2016-12-22 – 2016-12-25 (×8): 1 g via INTRAVENOUS
  Filled 2016-12-22 (×16): qty 1

## 2016-12-22 MED ORDER — RIFAXIMIN 550 MG PO TABS
550.0000 mg | ORAL_TABLET | Freq: Two times a day (BID) | ORAL | Status: DC
  Administered 2016-12-24 – 2017-01-01 (×17): 550 mg via ORAL
  Filled 2016-12-22 (×17): qty 1

## 2016-12-22 MED ORDER — HYDRALAZINE HCL 20 MG/ML IJ SOLN
10.0000 mg | Freq: Four times a day (QID) | INTRAMUSCULAR | Status: DC | PRN
  Administered 2016-12-22 (×2): 10 mg via INTRAVENOUS
  Filled 2016-12-22 (×2): qty 1

## 2016-12-22 MED ORDER — LACTULOSE ENEMA
300.0000 mL | Freq: Three times a day (TID) | ORAL | Status: DC
  Administered 2016-12-22 – 2016-12-23 (×6): 300 mL via RECTAL
  Filled 2016-12-22 (×16): qty 300

## 2016-12-22 MED ORDER — ACETAMINOPHEN 650 MG RE SUPP
650.0000 mg | RECTAL | Status: DC | PRN
  Administered 2016-12-22 – 2016-12-23 (×2): 650 mg via RECTAL
  Filled 2016-12-22 (×2): qty 1

## 2016-12-22 MED ORDER — KETOROLAC TROMETHAMINE 30 MG/ML IJ SOLN
30.0000 mg | Freq: Four times a day (QID) | INTRAMUSCULAR | Status: AC | PRN
  Administered 2016-12-22 – 2016-12-23 (×2): 30 mg via INTRAVENOUS
  Filled 2016-12-22 (×3): qty 1

## 2016-12-22 MED ORDER — VANCOMYCIN HCL 10 G IV SOLR
1250.0000 mg | Freq: Two times a day (BID) | INTRAVENOUS | Status: DC
  Administered 2016-12-23 – 2016-12-25 (×5): 1250 mg via INTRAVENOUS
  Filled 2016-12-22 (×9): qty 1250

## 2016-12-22 MED ORDER — ACETAMINOPHEN 325 MG PO TABS
650.0000 mg | ORAL_TABLET | ORAL | Status: DC | PRN
  Administered 2016-12-24 – 2016-12-30 (×2): 650 mg via ORAL
  Filled 2016-12-22 (×2): qty 2

## 2016-12-22 NOTE — Progress Notes (Signed)
Pharmacy Antibiotic Note  [name] [name] is a 56 y.o. male admitted on 12/21/2016 with fall . He now has FUO.  Pharmacy has been consulted for meropenem and vancomycin dosing.  Plan: Meropenem 1gm IV q8 hours Vancomycin 2 gm load then '1250mg'$  IV q12 hours f/u renal function, cultures and clinical course  Height: '5\' 8"'$  (172.7 cm) Weight: 250 lb (113.4 kg) IBW/kg (Calculated) : 68.4  Temp (24hrs), Avg:100.4 F (38 C), Min:99 F (37.2 C), Max:101.7 F (38.7 C)   Recent Labs Lab 12/21/16 1125 12/22/16 0502  WBC 4.9 7.9  CREATININE 0.81 0.72    Estimated Creatinine Clearance: 124.5 mL/min (by C-G formula based on SCr of 0.72 mg/dL).    Allergies  Allergen Reactions  . Penicillins Rash    Has patient had a PCN reaction causing immediate rash, facial/tongue/throat swelling, SOB or lightheadedness with hypotension: Yes Has patient had a PCN reaction causing severe rash involving mucus membranes or skin necrosis: No Has patient had a PCN reaction that required hospitalization No Has patient had a PCN reaction occurring within the last 10 years: No If all of the above answers are "NO", then may proceed with Cephalosporin use.     Antimicrobials this admission: vanc 5/2 >>  meropenem 5/2 >>     Thank you for allowing pharmacy to be a part of this patient's care.  Beverlee Nims 12/22/2016 5:19 PM

## 2016-12-22 NOTE — Progress Notes (Signed)
PT Cancellation Note  Patient Details Name: ELEX MAINWARING MRN: [id number] DOB: 07-19-1961   Cancelled Treatment:    Reason Eval/Treat Not Completed: Patient's level of consciousness (Attempted to perform initial PT evaluation, however pt is only minimally arousable to sternal rub, and then quickly closes eyes.  He is not currently following commands.  Will check back later.)   Beth Shardai Star, PT, DPT X: [phone]

## 2016-12-22 NOTE — Progress Notes (Addendum)
PROGRESS NOTE    [name] [name]  ITG:[id number] DOB: 03/17/1961 DOA: 12/21/2016 PCP: Hermine Messick, MD    Brief Narrative:  56 year old male with history of [name] cirrhosis and repeated admissions for hepatic encephalopathy related to medication noncompliance, was recently discharged on 4/25 after being treated for hepatic encephalopathy. He was reportedly doing well after discharge, the following day became increasingly confused again. He began having multiple falls. He was brought back to the ER on 5/1 and admitted for recurrent encephalopathy. CT scan of the head showed scalp hematoma as well as parenchymal contusion. Ammonia was mildly elevated. He did not have any obvious signs of infection. He was admitted for further evaluation.   Assessment & Plan:   Active Problems:   Cirrhosis ([place])   Thrombocytopenia (HCC)   Hyperammonemia (HCC)   Altered mental state   Anxiety   Fall   Cerebral contusion (HCC)   Encephalopathy   1. Acute encephalopathy. Possibly multifactorial, related to hepatic encephalopathy versus benzodiazepine use versus fall with subsequent cerebral contusion. Patient continues to be lethargic today. Per staff, patient was more conversant yesterday. Ammonia level is relatively unchanged since yesterday. Will repeat CT scan today. Check ABG. Urinalysis and chest x-ray did not show any signs of infection.  2. Cerebral contusion. ED physician had discussed case with neurosurgery on call yesterday and no surgery was recommended at that time. It was felt patient could be monitored at [facility]. Continue neuro checks. Will repeat CT scan today.  3. Hyperammonemia. Patient has had recurrent admissions for hepatic encephalopathy due to noncompliance with lactulose. We'll continue him on lactulose and started on Xifaxan.  4. [place] cirrhosis. Follow-up with GI as an outpatient  5. Thrombocytopenia. Related to liver disease. Currently at baseline.  6. Hypertension.  Currently on metoprolol. Will use when necessary IV hydralazine for now until patient is able to take oral medications.  7. Multiple falls. Related to generalized weakness and encephalopathy. Physical therapy consult for possible placement.   DVT prophylaxis: scd Code Status: DNR Family Communication: discussed with wife over the phone Disposition Plan: will likely need placement.   Consultants:     Procedures:     Antimicrobials:      Subjective: Lethargic, does not answer questions  Objective: Vitals:   12/22/16 0600 12/22/16 0716 12/22/16 0800 12/22/16 0908  BP: (!) 146/76  (!) 165/80 (!) 171/91  Pulse:   69 73  Resp: 18 16 17 19   Temp:  99 F (37.2 C)    TempSrc:  Axillary    SpO2:   91% 93%  Weight:      Height:        Intake/Output Summary (Last 24 hours) at 12/22/16 0936 Last data filed at 12/22/16 0900  Gross per 24 hour  Intake                0 ml  Output                0 ml  Net                0 ml   Filed Weights   12/21/16 1116  Weight: 113.4 kg (250 lb)    Examination:  General exam: Appears lethargic and somnolent Respiratory system: mild wheeze bilaterally. Respiratory effort normal. Cardiovascular system: S1 & S2 heard, RRR. No JVD, murmurs, rubs, gallops or clicks. Trace-1+ pedal edema. Gastrointestinal system: Abdomen is nondistended, soft and nontender. No organomegaly or masses felt. Normal bowel sounds heard. Central nervous  system: somnolent. No focal neurological deficits. Extremities: no cyanosis or clubbing. Skin: No rashes, lesions or ulcers Psychiatry: wakes up to voice, does not answer questions or follow commands    Data Reviewed: I have personally reviewed following labs and imaging studies  CBC:  Recent Labs Lab 12/21/16 1125  WBC 4.9  NEUTROABS 3.4  HGB 13.2  HCT 40.4  MCV 97.1  PLT 63*   Basic Metabolic Panel:  Recent Labs Lab 12/21/16 1125  NA 138  K 4.2  CL 101  CO2 30  GLUCOSE 101*  BUN 10    CREATININE 0.81  CALCIUM 9.1   GFR: Estimated Creatinine Clearance: 123 mL/min (by C-G formula based on SCr of 0.81 mg/dL). Liver Function Tests:  Recent Labs Lab 12/21/16 1125  AST 48*  ALT 22  ALKPHOS 103  BILITOT 2.6*  PROT 6.8  ALBUMIN 3.4*   No results for input(s): LIPASE, AMYLASE in the last 168 hours.  Recent Labs Lab 12/21/16 1125 12/22/16 0502  AMMONIA 54* 56*   Coagulation Profile:  Recent Labs Lab 12/21/16 1125  INR 1.33   Cardiac Enzymes: No results for input(s): CKTOTAL, CKMB, CKMBINDEX, TROPONINI in the last 168 hours. BNP (last 3 results) No results for input(s): PROBNP in the last 8760 hours. HbA1C: No results for input(s): HGBA1C in the last 72 hours. CBG:  Recent Labs Lab 12/15/16 1136  GLUCAP 161*   Lipid Profile: No results for input(s): CHOL, HDL, LDLCALC, TRIG, CHOLHDL, LDLDIRECT in the last 72 hours. Thyroid Function Tests: No results for input(s): TSH, T4TOTAL, FREET4, T3FREE, THYROIDAB in the last 72 hours. Anemia Panel: No results for input(s): VITAMINB12, FOLATE, FERRITIN, TIBC, IRON, RETICCTPCT in the last 72 hours. Sepsis Labs: No results for input(s): PROCALCITON, LATICACIDVEN in the last 168 hours.  Recent Results (from the past 240 hour(s))  MRSA PCR Screening     Status: None   Collection Time: 12/21/16  4:25 PM  Result Value Ref Range Status   MRSA by PCR NEGATIVE NEGATIVE Final    Comment:        The GeneXpert MRSA Assay (FDA approved for NASAL specimens only), is one component of a comprehensive MRSA colonization surveillance program. It is not intended to diagnose MRSA infection nor to guide or monitor treatment for MRSA infections.          Radiology Studies: Ct Head Wo Contrast  Result Date: 12/21/2016 CLINICAL DATA:  Fall from bed this morning EXAM: CT HEAD WITHOUT CONTRAST TECHNIQUE: Contiguous axial images were obtained from the base of the skull through the vertex without intravenous contrast.  COMPARISON:  12/13/2016 FINDINGS: Brain: Focal area of increased attenuation is noted along the medial aspect of the left frontoparietal region just above the corpus callosum. This likely represents a small area of contusion. No subarachnoid hemorrhage or extra-axial hemorrhage is seen. Mild atrophic changes are noted. Vascular: No hyperdense vessel or unexpected calcification. Skull: Changes of prior mastoidectomies bilaterally. Sinuses/Orbits: Chronic changes are noted in the orbits bilaterally. Other: Mild soft tissue swelling is noted near the vertex posteriorly consistent with the recent injury. IMPRESSION: Scalp hematoma consistent with the recent injury. Area of increased attenuation along the medial aspect of the left cerebral hemisphere adjacent to the falx best seen on image number 24 series 2. This is consistent with a mild area of parenchymal contusion. Short-term follow-up examination is recommended. Electronically Signed   By: Inez Catalina M.D.   On: 12/21/2016 13:20   Dg Chest Portable 1 View  Result Date: 12/21/2016 CLINICAL DATA:  Status post fall from bed this morning. History of COPD, diabetes, current smoker. EXAM: PORTABLE CHEST 1 VIEW COMPARISON:  Chest x-ray of December 13, 2016 FINDINGS: The lungs are adequately inflated. The interstitial markings are mildly prominent bilaterally and more conspicuous today. The cardiac silhouette is enlarged and the pulmonary vascularity is more engorged today. There is calcification in the wall of the aortic arch. The mediastinum is normal in width. There is no pleural effusion. The bony thorax exhibits no acute abnormality. IMPRESSION: COPD with superimposed mild CHF. No focal pneumonia. The observed bony structures are unremarkable. Electronically Signed   By: David  Martinique M.D.   On: 12/21/2016 12:29        Scheduled Meds: . chlorhexidine  15 mL Mouth Rinse BID  . ezetimibe  10 mg Oral Daily  . fluconazole  100 mg Oral Daily  . lactulose  40 g  Oral TID  . mouth rinse  15 mL Mouth Rinse q12n4p  . metoprolol tartrate  50 mg Oral QHS  . metoprolol tartrate  25 mg Oral Daily  . multivitamin with minerals  1 tablet Oral Daily  . nystatin   Topical TID  . pantoprazole  40 mg Oral Daily  . potassium chloride SA  20 mEq Oral BID  . rifaximin  550 mg Oral BID   Continuous Infusions:   LOS: 0 days    Time spent: 51mns    Adel Burch, MD [facility] Pager [phone] If 7PM-7AM, please contact night-coverage [email or web address] Password [email or web address] 12/22/2016, 9:36 AM   Addendum 11:15:  Results of repeat CT head now available. Discussed case with radiologist Dr. HNevada Crane who felt that there is a trace amount of posttraumatic subarachnoid hemorrhage, which was also present on CT head done yesterday. No worsening findings or significant changes on CT head today. Will consider repeating CT scan if patient's mental status continues to deteriorate. If he improves, no further imaging needed.  Qunicy Higinbotham  Ad

## 2016-12-23 LAB — BLOOD CULTURE ID PANEL (REFLEXED)
Acinetobacter baumannii: NOT DETECTED
CANDIDA TROPICALIS: NOT DETECTED
Candida albicans: NOT DETECTED
Candida glabrata: NOT DETECTED
Candida krusei: NOT DETECTED
Candida parapsilosis: NOT DETECTED
Enterobacter cloacae complex: NOT DETECTED
Enterobacteriaceae species: NOT DETECTED
Enterococcus species: NOT DETECTED
Escherichia coli: NOT DETECTED
HAEMOPHILUS INFLUENZAE: NOT DETECTED
KLEBSIELLA PNEUMONIAE: NOT DETECTED
Klebsiella oxytoca: NOT DETECTED
Listeria monocytogenes: NOT DETECTED
NEISSERIA MENINGITIDIS: NOT DETECTED
PROTEUS SPECIES: NOT DETECTED
PSEUDOMONAS AERUGINOSA: NOT DETECTED
STAPHYLOCOCCUS AUREUS BCID: NOT DETECTED
STAPHYLOCOCCUS SPECIES: NOT DETECTED
STREPTOCOCCUS AGALACTIAE: NOT DETECTED
STREPTOCOCCUS PNEUMONIAE: NOT DETECTED
STREPTOCOCCUS SPECIES: DETECTED — AB
Serratia marcescens: NOT DETECTED
Streptococcus pyogenes: NOT DETECTED

## 2016-12-23 LAB — COMPREHENSIVE METABOLIC PANEL
ALT: 17 U/L (ref 17–63)
AST: 41 U/L (ref 15–41)
Albumin: 3 g/dL — ABNORMAL LOW (ref 3.5–5.0)
Alkaline Phosphatase: 83 U/L (ref 38–126)
Anion gap: 9 (ref 5–15)
BUN: 16 mg/dL (ref 6–20)
CHLORIDE: 108 mmol/L (ref 101–111)
CO2: 22 mmol/L (ref 22–32)
Calcium: 8.7 mg/dL — ABNORMAL LOW (ref 8.9–10.3)
Creatinine, Ser: 0.83 mg/dL (ref 0.61–1.24)
GFR calc Af Amer: >60 mL/min (ref >60)
Glucose, Bld: 160 mg/dL — ABNORMAL HIGH (ref 65–99)
POTASSIUM: 3.2 mmol/L — AB (ref 3.5–5.1)
SODIUM: 139 mmol/L (ref 135–145)
Total Bilirubin: 2.3 mg/dL — ABNORMAL HIGH (ref 0.3–1.2)
Total Protein: 6.5 g/dL (ref 6.5–8.1)

## 2016-12-23 LAB — TYPE AND SCREEN
ABO/RH(D): A POS
ANTIBODY SCREEN: NEGATIVE

## 2016-12-23 LAB — AMMONIA: AMMONIA: 52 umol/L — AB (ref 9–35)

## 2016-12-23 LAB — CBC
HCT: 38.5 % — ABNORMAL LOW (ref 39.0–52.0)
Hemoglobin: 12.7 g/dL — ABNORMAL LOW (ref 13.0–17.0)
MCH: 31.8 pg (ref 26.0–34.0)
MCHC: 33 g/dL (ref 30.0–36.0)
MCV: 96.3 fL (ref 78.0–100.0)
PLATELETS: 42 10*3/uL — AB (ref 150–400)
RBC: 4 MIL/uL — ABNORMAL LOW (ref 4.22–5.81)
RDW: 15.9 % — AB (ref 11.5–15.5)
WBC: 6 10*3/uL (ref 4.0–10.5)

## 2016-12-23 MED ORDER — SODIUM CHLORIDE 0.9 % IV SOLN
Freq: Once | INTRAVENOUS | Status: AC
  Administered 2016-12-23: 16:00:00 via INTRAVENOUS

## 2016-12-23 MED ORDER — SODIUM CHLORIDE 0.9 % IV SOLN: INTRAVENOUS | Status: DC

## 2016-12-23 MED ORDER — POTASSIUM CHLORIDE 10 MEQ/100ML IV SOLN
10.0000 meq | INTRAVENOUS | Status: AC
  Administered 2016-12-23 (×4): 10 meq via INTRAVENOUS
  Filled 2016-12-23 (×4): qty 100

## 2016-12-23 MED ORDER — POTASSIUM CHLORIDE IN NACL 40-0.9 MEQ/L-% IV SOLN
INTRAVENOUS | Status: DC
  Administered 2016-12-23 – 2016-12-24 (×2): 75 mL/h via INTRAVENOUS

## 2016-12-23 NOTE — Progress Notes (Signed)
CRITICAL VALUE ALERT  Critical value received:  Blood Culture-Gram Positive [place]  Date of notification:  12/23/2016  Time of notification:  1250  Nurse who received alert:  Lysbeth Galas RN  MD notified (1st page):  [place]  Time of first page:  1252

## 2016-12-23 NOTE — Progress Notes (Signed)
Patient has had approx. 8 loose stools since 0700 this AM. Flexi Seal placed. It is currently leaking. Placement checked by 2 RN's. Patient is still only responding to stimulation or by yelling his name. Patient is not following commands.  RN spoke with wife and obtained consent for PICC placement, Lumbar Puncture, and infusion of platelets.   Will continue to monitor  Margaret Pyle, RN

## 2016-12-23 NOTE — Progress Notes (Signed)
1010 Spoke with [place] with [facility] regarding new order for PICC line placement, ETA 11:30am Claiborne Billings, RN).

## 2016-12-23 NOTE — Consult Note (Addendum)
[place] A. Merlene Laughter, MD     [email or web address]          [name] [name] is an 57 y.o. male.   ASSESSMENT/PLAN: 1. Acute encephalopathy: The most likely etiology is from hepatic disease despite the patient having a couple episodes of fever. The patient is improving and therefore I think will continue with his lactulose and the repeat neurological evaluation. The patient does have 1 blood culture positive for enterococcus. Unclear significance but consider discussing with ID. I do not get the sense that he has a primary CNS infection.  2. Small intracranial hemorrhage which seems to be improving slightly. Unlikely to cause encephalopathy given the small size.  3. Marked thrombocytopenia:   Unable to do procedures such as spinal tap.  4. Gait disorder due to #1.   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 He has been noncompliant with care including with lactulose. Patient presented with a fall and encephalopathy/confusion. He was noted to have a small interhemispheric fissure bleed which has stabilized. He was quite stuporous on presentation. This evening he has gotten better as has become more responsive. Patient has had fevers of 101 and 102.9. Blood cultures being positive as outlined below. No sources have been identified. Review systems very limited given the confusion.    GENERAL: He is somewhat restless but does cooperate with evaluation.  HEENT: Neck is supple.  ABDOMEN: soft  EXTREMITIES: No edema   BACK: Normal  SKIN: Normal by inspection.    MENTAL STATUS: He is awake and alert. He does follow midline commands and to some degree appendicular commands. Speech is quite dysarthric and difficult to understand the patient. He does seem to indicate that he is in the hospital at [facility].  CRANIAL NERVES: Pupils R -cloudy and non-reactive; L equal,  round and reactive to light; extra ocular movements are full, there is no significant nystagmus; visual fields are full; upper and lower facial muscles are normal in strength and symmetric, there is no flattening of the nasolabial folds; tongue is midline; uvula is midline.  MOTOR: Bulk and tone are normal throughout. He has antigravity strength in both upper and lower extremities bilaterally.  COORDINATION: No asterixis; no tremors, no dysmetria or parkinsonism.  REFLEXES: Deep tendon reflexes are symmetrical and normal. Babinski reflexes are flexor bilaterally.   SENSATION: He does respond to painful some light bilaterally although this is reduced.       Blood pressure (!) 155/80, pulse 92, temperature 98.6 F (37 C), temperature source Axillary, resp. rate 20, height '5\' 8"'  (1.727 m), weight 218 lb 14.7 oz (99.3 kg), SpO2 99 %.  Past Medical History:  Diagnosis Date  . Anemia   . Anxiety   . Arthritis   . Bipolar 1 disorder ([place])   . Blind right eye   . Blood clots in brain    per patient  . Cardiomegaly   . Cataract    Right eye  . Cerebrovascular disease    Right vertebral artery  . Cholelithiasis    Asymptomatic  . Chronic back pain   . 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 Cirrhosis ([place])    diagnosed 07/2010 when presented with first episode of hepatic encephalopathy, afp on 416/13=4.8,  U/S on 12/14/11  liver stable, pt has received 2 hep A/B vaccines  . COPD (chronic obstructive pulmonary disease) ([place])   .  Depression   . Diabetes mellitus   . Diverticulosis   . GERD (gastroesophageal reflux disease)   . Hard of hearing   . Hepatic encephalopathy ([place])   . Hyperlipidemia   . Hypertension   . Mental retardation   . Seizures ([place])   . Thrombocytopenia ([place])     Past Surgical History:  Procedure Laterality Date  . CAST APPLICATION  02/28/2955   Procedure: MINOR CAST APPLICATION;  Surgeon: [name], MD;  Location: [facility];  Service: Orthopedics;   Laterality: Right;  no anesthesia , procedure room do not need or room !  . COLONOSCOPY  12/15/11   colonic divericulosis;poor prep, ACBE  recommended but has not been done  . ear operations    . ESOPHAGOGASTRODUODENOSCOPY  02/02/2011   [place]k-Normal esophagus.  No varices/Question mild portal gastropathy, extrinsic compression on the antrum, lesser curvature of uncertain significance, some minimally  nodular mucosa status post biopsy, patent pylorus, normal duodenum 1 and duodenum 2.  . ESOPHAGOGASTRODUODENOSCOPY  12/15/11   Rourk-->mild changes of portal gastropathy, gastric/duodenal erosions s/p bx  (reactive changes with mild chronic inflammation. No H.pylori  . FOOT SURGERY    . ORIF ANKLE FRACTURE  08/27/2011   Procedure: OPEN REDUCTION INTERNAL FIXATION (ORIF) ANKLE FRACTURE;  Surgeon: [name], MD;  Location: [facility];  Service: Orthopedics;  Laterality: Right;  . TONSILLECTOMY      Family History  Problem Relation Age of Onset  . Heart failure Mother   . Thyroid disease Mother   . Cancer Father   . Asthma Brother   . Heart attack Brother   . Cirrhosis Brother     EtOH  . Colon cancer Maternal Aunt     Social History:  reports that he has been smoking Cigarettes.  He has a 30.00 pack-year smoking history. He has never used smokeless tobacco. He reports that he does not drink alcohol or use drugs.  Allergies:  Allergies  Allergen Reactions  . Penicillins Rash    Has patient had a PCN reaction causing immediate rash, facial/tongue/throat swelling, SOB or lightheadedness with hypotension: Yes Has patient had a PCN reaction causing severe rash involving mucus membranes or skin necrosis: No Has patient had a PCN reaction that required hospitalization No Has patient had a PCN reaction occurring within the last 10 years: No If all of the above answers are "NO", then may proceed with Cephalosporin use.     Medications: Prior to Admission medications   Medication Sig Start  Date End Date Taking? Authorizing Provider  ALPRAZolam Duanne Moron) 0.5 MG tablet Take 0.5 mg by mouth 3 (three) times daily as needed for anxiety.   Yes Historical Provider, MD  aspirin EC 81 MG tablet Take 81 mg by mouth daily.   Yes Historical Provider, MD  ezetimibe (ZETIA) 10 MG tablet Take 10 mg by mouth daily.   Yes Historical Provider, MD  gabapentin (NEURONTIN) 300 MG capsule Take 300 mg by mouth 3 (three) times daily.   Yes Historical Provider, MD  lactulose (CHRONULAC) 10 GM/15ML solution Take 60 mLs (40 g total) by mouth 3 (three) times daily. Titrate for 3-4 bowel motions daily. 12/15/16  Yes Albertine Patricia, MD  metoprolol tartrate (LOPRESSOR) 25 MG tablet Take 25 mg by mouth 3 (three) times daily. Take 25 mg in the morning and 50 mg at night   Yes Historical Provider, MD  Multiple Vitamin (MULTIVITAMIN WITH MINERALS) TABS Take 1 tablet by mouth daily.   Yes Historical Provider, MD  omeprazole (PRILOSEC) 20 MG capsule Take 20 mg by mouth daily.   Yes Historical Provider, MD  potassium chloride SA (K-DUR,KLOR-CON) 20 MEQ tablet Take 20 mEq by mouth 2 (two) times daily.   Yes Historical Provider, MD    Scheduled Meds: . chlorhexidine  15 mL Mouth Rinse BID  . ezetimibe  10 mg Oral Daily  . fluconazole  100 mg Oral Daily  . ipratropium-albuterol  3 mL Nebulization Q6H  . lactulose  300 mL Rectal TID  . mouth rinse  15 mL Mouth Rinse q12n4p  . metoprolol tartrate  50 mg Oral QHS  . metoprolol tartrate  25 mg Oral Daily  . multivitamin with minerals  1 tablet Oral Daily  . nystatin   Topical TID  . pantoprazole  40 mg Oral Daily  . potassium chloride SA  20 mEq Oral BID  . rifaximin  550 mg Oral BID   Continuous Infusions: . 0.9 % NaCl with KCl 40 mEq / L 75 mL/hr (12/23/16 0944)  . meropenem (MERREM) IV Stopped (12/23/16 1506)  . vancomycin 1,250 mg (12/23/16 1752)   PRN Meds:.acetaminophen, acetaminophen, ALPRAZolam, hydrALAZINE, ketorolac     Results for orders placed or  performed during the hospital encounter of 12/21/16 (from the past 48 hour(s))  Ammonia     Status: Abnormal   Collection Time: 12/22/16  5:02 AM  Result Value Ref Range   Ammonia 56 (H) 9 - 35 umol/L  CBC     Status: Abnormal   Collection Time: 12/22/16  5:02 AM  Result Value Ref Range   WBC 7.9 4.0 - 10.5 K/uL   RBC 3.96 (L) 4.22 - 5.81 MIL/uL   Hemoglobin 12.5 (L) 13.0 - 17.0 g/dL   HCT 38.0 (L) 39.0 - 52.0 %   MCV 96.0 78.0 - 100.0 fL   MCH 31.6 26.0 - 34.0 pg   MCHC 32.9 30.0 - 36.0 g/dL   RDW 15.7 (H) 11.5 - 15.5 %   Platelets 59 (L) 150 - 400 K/uL    Comment: SPECIMEN CHECKED FOR CLOTS PLATELET COUNT CONFIRMED BY SMEAR LARGE PLATELETS PRESENT   Basic metabolic panel     Status: Abnormal   Collection Time: 12/22/16  5:02 AM  Result Value Ref Range   Sodium 136 135 - 145 mmol/L   Potassium 3.9 3.5 - 5.1 mmol/L   Chloride 104 101 - 111 mmol/L   CO2 24 22 - 32 mmol/L   Glucose, Bld 105 (H) 65 - 99 mg/dL   BUN 11 6 - 20 mg/dL   Creatinine, Ser 0.72 0.61 - 1.24 mg/dL   Calcium 8.6 (L) 8.9 - 10.3 mg/dL   GFR calc non Af Amer >60 >60 mL/min   GFR calc Af Amer >60 >60 mL/min    Comment: (NOTE) The eGFR has been calculated using the CKD EPI equation. This calculation has not been validated in all clinical situations. eGFR's persistently <60 mL/min signify possible Chronic Kidney Disease.    Anion gap 8 5 - 15  Blood gas, arterial     Status: Abnormal   Collection Time: 12/22/16 11:00 AM  Result Value Ref Range   FIO2 21.00    Delivery systems ROOM AIR    pH, Arterial 7.454 (H) 7.350 - 7.450   pCO2 arterial 38.1 32.0 - 48.0 mmHg   pO2, Arterial 61.5 (L) 83.0 - 108.0 mmHg   Bicarbonate 26.8 20.0 - 28.0 mmol/L   Acid-Base Excess 2.7 (H) 0.0 - 2.0 mmol/L   O2  Saturation 90.8 %   Collection site RIGHT RADIAL    Drawn by [phone]    Sample type ARTERIAL DRAW    Allens test (pass/fail) PASS PASS  Glucose, capillary     Status: Abnormal   Collection Time: 12/22/16  2:30 PM   Result Value Ref Range   Glucose-Capillary 106 (H) 65 - 99 mg/dL  Culture, blood (routine x 2)     Status: None (Preliminary result)   Collection Time: 12/22/16  5:31 PM  Result Value Ref Range   Specimen Description BLOOD LEFT HAND    Special Requests      BOTTLES DRAWN AEROBIC ONLY Blood Culture adequate volume   Culture NO GROWTH < 24 HOURS    Report Status PENDING   Culture, blood (routine x 2)     Status: None (Preliminary result)   Collection Time: 12/22/16  5:40 PM  Result Value Ref Range   Specimen Description LEFT ANTECUBITAL    Special Requests      BOTTLES DRAWN AEROBIC AND ANAEROBIC Blood Culture adequate volume   Culture  Setup Time      GRAM POSITIVE COCCI Gram Stain Report Called to,Read Back By and Verified With: HOWARD C AT 1250 ON [id number] BY THOMPSON S. AEROBIC BOTTLE ONLY Organism ID to follow Performed at [facility], [place] [address], [place], [place] [address]    Culture PENDING    Report Status PENDING   Blood Culture ID Panel (Reflexed)     Status: Abnormal   Collection Time: 12/22/16  5:40 PM  Result Value Ref Range   Enterococcus species NOT DETECTED NOT DETECTED   Listeria monocytogenes NOT DETECTED NOT DETECTED   Staphylococcus species NOT DETECTED NOT DETECTED   Staphylococcus aureus NOT DETECTED NOT DETECTED   Streptococcus species DETECTED (A) NOT DETECTED    Comment: Not Enterococcus species, Streptococcus agalactiae, Streptococcus pyogenes, or Streptococcus pneumoniae. CRITICAL RESULT CALLED TO, READ BACK BY AND VERIFIED WITH: Tana Conch RN 17:30 12/23/16 ([email or web address])    Streptococcus agalactiae NOT DETECTED NOT DETECTED   Streptococcus pneumoniae NOT DETECTED NOT DETECTED   Streptococcus pyogenes NOT DETECTED NOT DETECTED   Acinetobacter baumannii NOT DETECTED NOT DETECTED   Enterobacteriaceae species NOT DETECTED NOT DETECTED   Enterobacter cloacae complex NOT DETECTED NOT DETECTED   Escherichia coli NOT DETECTED NOT DETECTED   Klebsiella  oxytoca NOT DETECTED NOT DETECTED   Klebsiella pneumoniae NOT DETECTED NOT DETECTED   Proteus species NOT DETECTED NOT DETECTED   Serratia marcescens NOT DETECTED NOT DETECTED   Haemophilus influenzae NOT DETECTED NOT DETECTED   Neisseria meningitidis NOT DETECTED NOT DETECTED   Pseudomonas aeruginosa NOT DETECTED NOT DETECTED   Candida albicans NOT DETECTED NOT DETECTED   Candida glabrata NOT DETECTED NOT DETECTED   Candida krusei NOT DETECTED NOT DETECTED   Candida parapsilosis NOT DETECTED NOT DETECTED   Candida tropicalis NOT DETECTED NOT DETECTED    Comment: Performed at [facility] [address] [place], [place] [address]  CBC     Status: Abnormal   Collection Time: 12/23/16  4:33 AM  Result Value Ref Range   WBC 6.0 4.0 - 10.5 K/uL   RBC 4.00 (L) 4.22 - 5.81 MIL/uL   Hemoglobin 12.7 (L) 13.0 - 17.0 g/dL   HCT 38.5 (L) 39.0 - 52.0 %   MCV 96.3 78.0 - 100.0 fL   MCH 31.8 26.0 - 34.0 pg   MCHC 33.0 30.0 - 36.0 g/dL   RDW 15.9 (H) 11.5 -  15.5 %   Platelets 42 (L) 150 - 400 K/uL    Comment: SPECIMEN CHECKED FOR CLOTS PLATELET COUNT CONFIRMED BY SMEAR   Comprehensive metabolic panel     Status: Abnormal   Collection Time: 12/23/16  4:33 AM  Result Value Ref Range   Sodium 139 135 - 145 mmol/L   Potassium 3.2 (L) 3.5 - 5.1 mmol/L    Comment: DELTA CHECK NOTED   Chloride 108 101 - 111 mmol/L   CO2 22 22 - 32 mmol/L   Glucose, Bld 160 (H) 65 - 99 mg/dL   BUN 16 6 - 20 mg/dL   Creatinine, Ser 0.83 0.61 - 1.24 mg/dL   Calcium 8.7 (L) 8.9 - 10.3 mg/dL   Total Protein 6.5 6.5 - 8.1 g/dL   Albumin 3.0 (L) 3.5 - 5.0 g/dL   AST 41 15 - 41 U/L   ALT 17 17 - 63 U/L   Alkaline Phosphatase 83 38 - 126 U/L   Total Bilirubin 2.3 (H) 0.3 - 1.2 mg/dL   GFR calc non Af Amer >60 >60 mL/min   GFR calc Af Amer >60 >60 mL/min    Comment: (NOTE) The eGFR has been calculated using the CKD EPI equation. This calculation has not been validated in all clinical situations. eGFR's  persistently <60 mL/min signify possible Chronic Kidney Disease.    Anion gap 9 5 - 15  Ammonia     Status: Abnormal   Collection Time: 12/23/16  4:33 AM  Result Value Ref Range   Ammonia 52 (H) 9 - 35 umol/L  Type and screen [facility]     Status: None   Collection Time: 12/23/16  1:46 PM  Result Value Ref Range   ABO/RH(D) A POS    Antibody Screen NEG    Sample Expiration 12/26/2016   Prepare Pheresed Platelets     Status: None (Preliminary result)   Collection Time: 12/23/16  1:48 PM  Result Value Ref Range   Unit Number [id number]    Blood Component Type PLTP LR2 PAS    Unit division 00    Status of Unit ISSUED    Transfusion Status OK TO TRANSFUSE    Unit Number [id number]    Blood Component Type PLTPHER LR3    Unit division 00    Status of Unit ALLOCATED    Transfusion Status OK TO TRANSFUSE     Studies/Results:  HEAD CT FINDINGS: Brain: Trace hyperdensity along the left aspect of the interhemispheric fissure and medial left frontal lobe on series 2, image 24 is stable and is most compatible with trace subarachnoid hemorrhage. A trace amount of parafalcine subarachnoid is also visible on the opposite side (series 2, images 22 and 23). No associated mass effect or cerebral edema.  No other acute intracranial hemorrhage identified. No intraventricular blood.  No midline shift, ventriculomegaly, mass effect, evidence of mass lesion, or cortically based acute infarction. Gray-white matter differentiation is within normal limits throughout the brain.  Vascular: Calcified atherosclerosis at the skull base.  Skull: Stable and intact.  No acute osseous abnormality identified.  Sinuses/Orbits: Previous mastoidectomies appears stable. Stable paranasal sinus pneumatization, anterior left ethmoid mucosal thickening and opacification.  Other: Right phthisis bulbi. Left orbits soft tissues are stable and intact. Mild broad-based posterior convexity  scalp hematoma has regressed (series 3, image 30). No other scalp hematoma or acute superficial soft tissue injury identified.  IMPRESSION: 1. Small parafalcine density along the medial frontal lobes is stable an most compatible  with a trace amount of posttraumatic Subarachnoid Hemorrhage. 2. No associated mass effect and no other acute intracranial abnormality. 3. Regressed mild posterior convexity scalp hematoma. No skull fracture identified.      EEG 12/15/16 This recording is abnormal due to the following reasons: 1. Moderately severe global slowing indicating a moderate to severe global encephalopathy. 2. Multiple triphasic waves which are typically seen in toxic metabolic encephalopathies particularly from hepatic failure or renal failure.      Head CT scan is reviewed in person. His compared to the previous scan. The increased brightness seen in the interhemispheric fissure is a about the same or slightly reduced on  today's scan. It is seen on 4 cuts and does not have mass effect.  Hind Chesler A. Merlene Laughter, M.D.  Diplomate, Tax adviser of Psychiatry and Neurology ( Neurology). 12/23/2016, 6:21 PM

## 2016-12-23 NOTE — Progress Notes (Signed)
While patient working with PT, patient was more alert and able to respond to RN and Physical therapist. Patient stated he was at [facility] and in pain. Other orientation questions were not answered. He also stated he had to use the restroom. RN reoriented patient and talked to him about what was occurring at the moment. Pt speech is still very incomprehensible. RN can understand some answers  Pt did follow some commands, but not all. He is still very drowsy and falls back to sleep immediately. Charted changes in Flowsheet.  Lumbar Puncture is scheduled for tomorrow PICC was placed Type and Screen completed RN will begin to infuse platelets.   Will continue to monitor patient  Margaret Pyle, RN

## 2016-12-23 NOTE — Progress Notes (Signed)
Pt identified on [facility] report. Currently encephalopathic w/ lethargy. He is difficult to arouse and does not answer questions appropriately. Unable to obtain any nutritional history from patient and no historians at bed side. He has been admitted ~48 hrs. No PO intake since admit. Continue to monitor situation for now.   Of note, his weight of 250 lbs is though to have been estimated as patient was unable to provide any info. Bed weight today was 99.3 kg.   Burtis Junes RD, LDN, CNSC Clinical Nutrition Pager: [phone] 12/23/2016 12:08 PM

## 2016-12-23 NOTE — Evaluation (Signed)
Physical Therapy Evaluation Patient Details Name: [name] [name] MRN: [id number] DOB: 1961-08-16 Today's Date: 12/23/2016   History of Present Illness  56 y.o. male with h/o NASH cirrhosis and repeated episodes of hepatic encephalopathy due to medication noncompliance, DM, seizure disorder, not taking his meds and bipolar disorder, comes in today with increased confusion. Patient is quite somnolent at time of my evaluation and no family members are present. Per report given to EDP,  Family member stated that he was found on the floor shaking and improved after 20 minutes but never regained complete consciousness. Brought to the ED for evaluation. VSS, labs significant for chronic pancytopenia, CT head did show small subarachnoid hemorrhage, but this was unchanged on follow up CT, UA and CXR negative for evidence of infectious sources.  Dx: toxic metabolic encephalopathy      Clinical Impression  Pt received in bed, and was agreeable to PT evaluation.  Pt is very lethargic, and requires sternal and loudly calling pt's name for pt to awaken.  Pt is very HOH, and unable to obtain PLOF from the pt, but received from chart review.  Pt normally uses a RW for ambulation and requires assistance from wife for bathing.  Pt is very confused and has garbled/babbling speech sometimes, but is able to speak clearly at times as well.  He has difficulty attending to the L side of his body throughout, and has tendency to face the right when in the bed.  Pt is recommended to d/c to SNF at this time due to high risk for falling, and increased need for assistance with all functional mobility tasks.     Follow Up Recommendations SNF    Equipment Recommendations  None recommended by PT    Recommendations for Other Services       Precautions / Restrictions Precautions Precautions: Fall Precaution Comments: 2 falls in the past 6 months.  Plus an additional fall that brought him into the hospital this time.        Mobility  Bed Mobility Overal bed mobility: Needs Assistance Bed Mobility: Supine to Sit     Supine to sit: Min assist;HOB elevated        Transfers Overall transfer level: Needs assistance Equipment used: None;2 person hand held assist Transfers: Sit to/from Stand Sit to Stand: Mod assist         General transfer comment: Pt was able to take a few lateral steps towards the [facility].    Ambulation/Gait                Stairs            Wheelchair Mobility    Modified Rankin (Stroke Patients Only)       Balance Overall balance assessment: History of Falls;Needs assistance Sitting-balance support: Bilateral upper extremity supported;Feet supported Sitting balance-Leahy Scale: Fair     Standing balance support: Bilateral upper extremity supported Standing balance-Leahy Scale: Poor                               Pertinent Vitals/Pain Pain Assessment:  ("All over" )    Home Living   Living Arrangements: Spouse/significant other Available Help at Discharge: Available 24 hours/day Type of Home: Apartment Home Access: Level entry     Home Layout: One level Home Equipment: Walker - 2 wheels;Cane - single point      Prior Function     Gait / Transfers Assistance Needed: Pt ambulates  with RW for short trips.  He occasionally uses the cane.  Household ambulation distances only.   ADL's / Homemaking Assistance Needed: independent with dressing, and assistance from wife to get in/out of the shower for bathing.         Hand Dominance   Dominant Hand: Left    Extremity/Trunk Assessment   Upper Extremity Assessment Upper Extremity Assessment: Generalized weakness;LUE deficits/detail LUE Deficits / Details: Pt demonstrates some neglect to the L side    Lower Extremity Assessment Lower Extremity Assessment: Generalized weakness       Communication   Communication: HOH;Other (comment) (poor vision)  Cognition Arousal/Alertness:  Awake/alert (But quickly goes back to sleep when laying down) Behavior During Therapy: Impulsive Overall Cognitive Status: Impaired/Different from baseline Area of Impairment: Orientation;Memory                 Orientation Level: Disoriented to;Place;Situation;Time             General Comments: Initially states that we are at a bank, but then he is eventually able to state that he is at [place]     Assessment/Plan    PT Assessment Patient needs continued PT services  PT Problem List Decreased strength;Decreased activity tolerance;Decreased balance;Decreased mobility;Decreased coordination;Decreased cognition;Decreased knowledge of use of DME;Decreased safety awareness;Decreased skin integrity       PT Treatment Interventions Gait training;Therapeutic activities    PT Goals (Current goals can be found in the Care Plan section)  Acute Rehab PT Goals Patient Stated Goal: To go home.  PT Goal Formulation: All assessment and education complete, DC therapy Time For Goal Achievement: 01/06/17 Potential to Achieve Goals: Fair    Frequency Min 5X/week   Barriers to discharge Decreased caregiver support      Co-evaluation               AM-PAC PT "6 Clicks" Daily Activity  Outcome Measure Difficulty turning over in bed (including adjusting bedclothes, sheets and blankets)?: A Little Difficulty moving from lying on back to sitting on the side of the bed? : A Little Difficulty sitting down on and standing up from a chair with arms (e.g., wheelchair, bedside commode, etc,.)?: A Lot Help needed moving to and from a bed to chair (including a wheelchair)?: A Lot Help needed walking in hospital room?: A Lot Help needed climbing 3-5 steps with a railing? : A Lot 6 Click Score: 14    End of Session Equipment Utilized During Treatment: Gait belt;Oxygen Activity Tolerance: Patient tolerated treatment well Patient left: in  bed;with call bell/phone within reach;with bed alarm set Nurse Communication: Mobility status (RN present and assisted with mobility.  ) PT Visit Diagnosis: Other abnormalities of gait and mobility (R26.89);Muscle weakness (generalized) (M62.81);History of falling (Z91.81);Other symptoms and signs involving the nervous system (R29.898)    Time: 9211-9417 PT Time Calculation (min) (ACUTE ONLY): 26 min   Charges:   PT Evaluation $PT Eval Moderate Complexity: 1 Procedure PT Treatments $Therapeutic Activity: 8-22 mins   PT G Codes:   PT G-Codes **NOT FOR INPATIENT CLASS** Functional Assessment Tool Used: AM-PAC 6 Clicks Basic Mobility;Clinical judgement Functional Limitation: Mobility: Walking and moving around Mobility: Walking and Moving Around Current Status (E0814): At least 40 percent but less than 60 percent impaired, limited or restricted Mobility: Walking and Moving Around Goal Status [phone]): At least 20 percent but less than 40 percent impaired, limited or restricted  Beth Tawan Degroote, PT, DPT X: [phone]

## 2016-12-23 NOTE — Care Management Note (Signed)
Case Management Note  Patient Details  Name: [name] [name] MRN: [id number] Date of Birth: [birth year]  Subjective/Objective:    Adm from home with encephalopathy/falls. Hx of NAHS cirrhosis, non compliance with medications. Recommended for SNF.              Action/Plan: CM following patient's progression and needs.   Expected Discharge Date:        12/26/2016          Expected Discharge Plan:     In-House Referral:     Discharge planning Services  CM Consult  Post Acute Care Choice:    Choice offered to:     DME Arranged:    DME Agency:     HH Arranged:    HH Agency:     Status of Service:  In process, will continue to follow  If discussed at Long Length of Stay Meetings, dates discussed:    Additional Comments:  Tyara Dassow, Chauncey Reading, RN 12/23/2016, 2:07 PM

## 2016-12-23 NOTE — Progress Notes (Signed)
PROGRESS NOTE    [name] [name]  TJQ:[id number] DOB: Mar 29, 1961 DOA: 12/21/2016 PCP: [name] Messick, MD    Brief Narrative:  56 year old male with history of [name] [name] cirrhosis and repeated admissions for hepatic encephalopathy related to medication noncompliance, was recently discharged on 4/25 after being treated for hepatic encephalopathy. He was reportedly doing well after discharge, the following day became increasingly confused again. He began having multiple falls. He was brought back to the ER on 5/1 and admitted for recurrent encephalopathy. CT scan of the head showed scalp hematoma as well as parenchymal contusion. Ammonia was mildly elevated. He did not have any obvious signs of infection. He was admitted for further evaluation.   Assessment & Plan:   Active Problems:   COPD (chronic obstructive pulmonary disease) (HCC)   HTN (hypertension)   Cirrhosis (HCC)   Thrombocytopenia (HCC)   Hyperammonemia (HCC)   Altered mental state   Anxiety   Fall   Cerebral contusion (HCC)   Encephalopathy   1. Acute encephalopathy. Unclear etiology. Ammonia level has been mildly elevated, but it is unclear if this degree of elevation has clinical significance. He was taking benzodiazepines prior to admission, but this dose has been decreased since admission and effectively, patient has not been receiving any benzos since admission. CT head did show small subarachnoid hemorrhage, but this was unchanged on follow up CT. ABG does not show elevated PCO2. He is having fevers. No clear source of infection. Urinalysis, chest xray and abdominal exam are benign. Patient continues to be lethargic today. With ongoing fevers, blood cultures were ordered and he was started on intravenous antibiotics. Neurology has been consulted. Will consider lumbar puncture. He will need platelet transfusion prior to procedure.  2. Cerebral contusion. ED physician had discussed case with neurosurgery on call at the time of  admission and no surgery was recommended at that time. It was felt patient could be [facility] at [name]. Continue neuro checks. CT head was repeated on 5/2 with no significant changes.  3. Hyperammonemia. Patient has had recurrent admissions for hepatic encephalopathy due to noncompliance with lactulose. He is receiving lactulose enemas (since he is unable to take po meds). He is having bowel movements. Ammonia level has been stable.  4. Nash cirrhosis. Follow-up with GI as an outpatient  5. Thrombocytopenia. Related to liver disease/infection. No signs of bleeding. Continue to monitor  6. Hypertension. Currently on metoprolol. Will use when necessary IV hydralazine for now until patient is able to take oral medications.  7. Multiple falls. Related to generalized weakness and encephalopathy. Physical therapy consult for possible placement.   DVT prophylaxis: scd Code Status: DNR Family Communication: discussed with wife over the phone Disposition Plan: will likely need placement.   Consultants:   neurology  Procedures:     Antimicrobials:   Vancomycin 5/2>>  Meropenem 5/2>>   Subjective: Opens eyes to voice and moans but does not answer questions  Objective: Vitals:   12/23/16 0600 12/23/16 0700 12/23/16 0711 12/23/16 0816  BP: (!) 144/63 120/65    Pulse: 87 82 80   Resp: 14 14 14    Temp:   97.6 F (36.4 C)   TempSrc:   Axillary   SpO2: 96% 93% 93% 100%  Weight:      Height:        Intake/Output Summary (Last 24 hours) at 12/23/16 0905 Last data filed at 12/23/16 7622  Gross per 24 hour  Intake  600 ml  Output              300 ml  Net              300 ml   Filed Weights   12/21/16 1116  Weight: 113.4 kg (250 lb)    Examination:  General exam: somnolent, wakes up to voice, does not answer questions Respiratory system: bilateral rhonchi. Respiratory effort normal. Cardiovascular system:RRR. No murmurs, rubs,  gallops. Gastrointestinal system: Abdomen is nondistended, soft and nontender. No organomegaly or masses felt. Normal bowel sounds heard. Central nervous system:  No focal neurological deficits. Extremities: No C/C/E, +pedal pulses Skin: No rashes, lesions or ulcers Psychiatry: lethargic    Data Reviewed: I have personally reviewed following labs and imaging studies  CBC:  Recent Labs Lab 12/21/16 1125 12/22/16 0502 12/23/16 0433  WBC 4.9 7.9 6.0  NEUTROABS 3.4  --   --   HGB 13.2 12.5* 12.7*  HCT 40.4 38.0* 38.5*  MCV 97.1 96.0 96.3  PLT 63* 59* 42*   Basic Metabolic Panel:  Recent Labs Lab 12/21/16 1125 12/22/16 0502 12/23/16 0433  NA 138 136 139  K 4.2 3.9 3.2*  CL 101 104 108  CO2 30 24 22   GLUCOSE 101* 105* 160*  BUN 10 11 16   CREATININE 0.81 0.72 0.83  CALCIUM 9.1 8.6* 8.7*   GFR: Estimated Creatinine Clearance: 120 mL/min (by C-G formula based on SCr of 0.83 mg/dL). Liver Function Tests:  Recent Labs Lab 12/21/16 1125 12/23/16 0433  AST 48* 41  ALT 22 17  ALKPHOS 103 83  BILITOT 2.6* 2.3*  PROT 6.8 6.5  ALBUMIN 3.4* 3.0*   No results for input(s): LIPASE, AMYLASE in the last 168 hours.  Recent Labs Lab 12/21/16 1125 12/22/16 0502 12/23/16 0433  AMMONIA 54* 56* 52*   Coagulation Profile:  Recent Labs Lab 12/21/16 1125  INR 1.33   Cardiac Enzymes: No results for input(s): CKTOTAL, CKMB, CKMBINDEX, TROPONINI in the last 168 hours. BNP (last 3 results) No results for input(s): PROBNP in the last 8760 hours. HbA1C: No results for input(s): HGBA1C in the last 72 hours. CBG:  Recent Labs Lab 12/22/16 1430  GLUCAP 106*   Lipid Profile: No results for input(s): CHOL, HDL, LDLCALC, TRIG, CHOLHDL, LDLDIRECT in the last 72 hours. Thyroid Function Tests: No results for input(s): TSH, T4TOTAL, FREET4, T3FREE, THYROIDAB in the last 72 hours. Anemia Panel: No results for input(s): VITAMINB12, FOLATE, FERRITIN, TIBC, IRON, RETICCTPCT in  the last 72 hours. Sepsis Labs: No results for input(s): PROCALCITON, LATICACIDVEN in the last 168 hours.  Recent Results (from the past 240 hour(s))  MRSA PCR Screening     Status: None   Collection Time: 12/21/16  4:25 PM  Result Value Ref Range Status   MRSA by PCR NEGATIVE NEGATIVE Final    Comment:        The GeneXpert MRSA Assay (FDA approved for NASAL specimens only), is one component of a comprehensive MRSA colonization surveillance program. It is not intended to diagnose MRSA infection nor to guide or monitor treatment for MRSA infections.   Culture, blood (routine x 2)     Status: None (Preliminary result)   Collection Time: 12/22/16  5:31 PM  Result Value Ref Range Status   Specimen Description BLOOD LEFT HAND  Final   Special Requests   Final    BOTTLES DRAWN AEROBIC ONLY Blood Culture adequate volume   Culture NO GROWTH < 24 HOURS  Final  Report Status PENDING  Incomplete  Culture, blood (routine x 2)     Status: None (Preliminary result)   Collection Time: 12/22/16  5:40 PM  Result Value Ref Range Status   Specimen Description LEFT ANTECUBITAL  Final   Special Requests   Final    BOTTLES DRAWN AEROBIC AND ANAEROBIC Blood Culture adequate volume   Culture NO GROWTH < 24 HOURS  Final   Report Status PENDING  Incomplete         Radiology Studies: Ct Head Wo Contrast  Addendum Date: 12/22/2016   ADDENDUM REPORT: 12/22/2016 11:24 ADDENDUM: Study discussed by telephone with Dr. Jolaine Artist MEMON on 12/22/2016 at 1117 hours. Electronically Signed   By: Genevie Ann M.D.   On: 12/22/2016 11:24   Result Date: 12/22/2016 CLINICAL DATA:  56 year old male with worsening altered mental status. [place] out of bed yesterday striking the right side. Slurred speech. EXAM: CT HEAD WITHOUT CONTRAST TECHNIQUE: Contiguous axial images were obtained from the base of the skull through the vertex without intravenous contrast. COMPARISON:  Head CT 12/21/2016 and earlier. FINDINGS: Brain: Trace  hyperdensity along the left aspect of the interhemispheric fissure and medial left frontal lobe on series 2, image 24 is stable and is most compatible with trace subarachnoid hemorrhage. A trace amount of parafalcine subarachnoid is also visible on the opposite side (series 2, images 22 and 23). No associated mass effect or cerebral edema. No other acute intracranial hemorrhage identified. No intraventricular blood. No midline shift, ventriculomegaly, mass effect, evidence of mass lesion, or cortically based acute infarction. Gray-white matter differentiation is within normal limits throughout the brain. Vascular: Calcified atherosclerosis at the skull base. Skull: Stable and intact.  No acute osseous abnormality identified. Sinuses/Orbits: Previous mastoidectomies appears stable. Stable paranasal sinus pneumatization, anterior left ethmoid mucosal thickening and opacification. Other: Right phthisis bulbi. Left orbits soft tissues are stable and intact. Mild broad-based posterior convexity scalp hematoma has regressed (series 3, image 30). No other scalp hematoma or acute superficial soft tissue injury identified. IMPRESSION: 1. Small parafalcine density along the medial frontal lobes is stable an most compatible with a trace amount of posttraumatic Subarachnoid Hemorrhage. 2. No associated mass effect and no other acute intracranial abnormality. 3. Regressed mild posterior convexity scalp hematoma. No skull fracture identified. Electronically Signed: By: Genevie Ann M.D. On: 12/22/2016 11:05   Ct Head Wo Contrast  Result Date: 12/21/2016 CLINICAL DATA:  Fall from bed this morning EXAM: CT HEAD WITHOUT CONTRAST TECHNIQUE: Contiguous axial images were obtained from the base of the skull through the vertex without intravenous contrast. COMPARISON:  12/13/2016 FINDINGS: Brain: Focal area of increased attenuation is noted along the medial aspect of the left frontoparietal region just above the corpus callosum. This  likely represents a small area of contusion. No subarachnoid hemorrhage or extra-axial hemorrhage is seen. Mild atrophic changes are noted. Vascular: No hyperdense vessel or unexpected calcification. Skull: Changes of prior mastoidectomies bilaterally. Sinuses/Orbits: Chronic changes are noted in the orbits bilaterally. Other: Mild soft tissue swelling is noted near the vertex posteriorly consistent with the recent injury. IMPRESSION: Scalp hematoma consistent with the recent injury. Area of increased attenuation along the medial aspect of the left cerebral hemisphere adjacent to the falx best seen on image number 24 series 2. This is consistent with a mild area of parenchymal contusion. Short-term follow-up examination is recommended. Electronically Signed   By: Inez Catalina M.D.   On: 12/21/2016 13:20   Dg Chest Port 1 View  Result Date:  12/22/2016 CLINICAL DATA:  New onset of fever last night. History of DM, HTN, COPD, GERD, Cirrhosis, seizures. Pt would not wake during exam. EXAM: PORTABLE CHEST 1 VIEW COMPARISON:  12/21/2016 FINDINGS: Mild enlargement of the cardiopericardial silhouette. No mediastinal or hilar masses. No evidence of adenopathy. Lungs are clear.  No pleural effusion.  No pneumothorax. Skeletal structures are grossly intact. IMPRESSION: No acute cardiopulmonary disease. Electronically Signed   By: Lajean Manes M.D.   On: 12/22/2016 19:24   Dg Chest Portable 1 View  Result Date: 12/21/2016 CLINICAL DATA:  Status post fall from bed this morning. History of COPD, diabetes, current smoker. EXAM: PORTABLE CHEST 1 VIEW COMPARISON:  Chest x-ray of December 13, 2016 FINDINGS: The lungs are adequately inflated. The interstitial markings are mildly prominent bilaterally and more conspicuous today. The cardiac silhouette is enlarged and the pulmonary vascularity is more engorged today. There is calcification in the wall of the aortic arch. The mediastinum is normal in width. There is no pleural  effusion. The bony thorax exhibits no acute abnormality. IMPRESSION: COPD with superimposed mild CHF. No focal pneumonia. The observed bony structures are unremarkable. Electronically Signed   By: David  Martinique M.D.   On: 12/21/2016 12:29        Scheduled Meds: . chlorhexidine  15 mL Mouth Rinse BID  . ezetimibe  10 mg Oral Daily  . fluconazole  100 mg Oral Daily  . ipratropium-albuterol  3 mL Nebulization Q6H  . lactulose  300 mL Rectal TID  . mouth rinse  15 mL Mouth Rinse q12n4p  . metoprolol tartrate  50 mg Oral QHS  . metoprolol tartrate  25 mg Oral Daily  . multivitamin with minerals  1 tablet Oral Daily  . nystatin   Topical TID  . pantoprazole  40 mg Oral Daily  . potassium chloride SA  20 mEq Oral BID  . rifaximin  550 mg Oral BID   Continuous Infusions: . meropenem (MERREM) IV Stopped (12/23/16 1959)  . potassium chloride    . sodium chloride 0.9 % 1,000 mL with potassium chloride 40 mEq infusion    . vancomycin Stopped (12/23/16 0703)     LOS: 1 day    Time spent: 49mns    MEMON,JEHANZEB, MD [facility] Pager [phone] If 7PM-7AM, please contact night-coverage [email or web address] Password [email or web address] 12/23/2016, 9:05 AM

## 2016-12-24 ENCOUNTER — Inpatient Hospital Stay (HOSPITAL_COMMUNITY): Payer: Medicare HMO

## 2016-12-24 ENCOUNTER — Encounter (HOSPITAL_COMMUNITY): Payer: Self-pay

## 2016-12-24 LAB — GLUCOSE, CSF: GLUCOSE CSF: 94 mg/dL — AB (ref 40–70)

## 2016-12-24 LAB — CBC
HCT: 34.7 % — ABNORMAL LOW (ref 39.0–52.0)
HEMOGLOBIN: 11 g/dL — AB (ref 13.0–17.0)
MCH: 31.5 pg (ref 26.0–34.0)
MCHC: 31.7 g/dL (ref 30.0–36.0)
MCV: 99.4 fL (ref 78.0–100.0)
Platelets: 74 10*3/uL — ABNORMAL LOW (ref 150–400)
RBC: 3.49 MIL/uL — AB (ref 4.22–5.81)
RDW: 16.4 % — ABNORMAL HIGH (ref 11.5–15.5)
WBC: 3.4 10*3/uL — ABNORMAL LOW (ref 4.0–10.5)

## 2016-12-24 LAB — CSF CELL COUNT WITH DIFFERENTIAL
RBC Count, CSF: 1020 /mm3 — ABNORMAL HIGH
TUBE #: 4
WBC CSF: 3 /mm3 (ref 0–5)

## 2016-12-24 LAB — BASIC METABOLIC PANEL
Anion gap: 8 (ref 5–15)
BUN: 17 mg/dL (ref 6–20)
CHLORIDE: 117 mmol/L — AB (ref 101–111)
CO2: 24 mmol/L (ref 22–32)
CREATININE: 0.69 mg/dL (ref 0.61–1.24)
Calcium: 8.7 mg/dL — ABNORMAL LOW (ref 8.9–10.3)
GFR calc Af Amer: >60 mL/min (ref >60)
GFR calc non Af Amer: >60 mL/min (ref >60)
GLUCOSE: 219 mg/dL — AB (ref 65–99)
POTASSIUM: 3.5 mmol/L (ref 3.5–5.1)
SODIUM: 149 mmol/L — AB (ref 135–145)

## 2016-12-24 LAB — PROTEIN, CSF: TOTAL PROTEIN, CSF: 58 mg/dL — AB (ref 15–45)

## 2016-12-24 MED ORDER — LIDOCAINE HCL (PF) 1 % IJ SOLN
INTRAMUSCULAR | Status: AC
  Filled 2016-12-24: qty 10

## 2016-12-24 MED ORDER — LACTULOSE 10 GM/15ML PO SOLN
40.0000 g | Freq: Three times a day (TID) | ORAL | Status: DC
  Administered 2016-12-24 – 2017-01-01 (×22): 40 g via ORAL
  Filled 2016-12-24 (×26): qty 60

## 2016-12-24 MED ORDER — POVIDONE-IODINE 10 % EX SOLN
CUTANEOUS | Status: AC
  Filled 2016-12-24: qty 15

## 2016-12-24 MED ORDER — SODIUM CHLORIDE 0.9% FLUSH
10.0000 mL | INTRAVENOUS | Status: DC | PRN
  Administered 2017-01-01: 10 mL
  Filled 2016-12-24: qty 40

## 2016-12-24 MED ORDER — SODIUM CHLORIDE 0.9% FLUSH
10.0000 mL | Freq: Two times a day (BID) | INTRAVENOUS | Status: DC
  Administered 2016-12-24 – 2016-12-25 (×5): 10 mL
  Administered 2016-12-26: 30 mL
  Administered 2016-12-26 – 2016-12-27 (×3): 10 mL
  Administered 2016-12-28: 3 mL
  Administered 2016-12-29 – 2016-12-31 (×6): 10 mL
  Administered 2017-01-01: 30 mL
  Administered 2017-01-01: 10 mL

## 2016-12-24 MED ORDER — CHLORHEXIDINE GLUCONATE CLOTH 2 % EX PADS
6.0000 | MEDICATED_PAD | Freq: Every day | CUTANEOUS | Status: DC
  Administered 2016-12-24 – 2017-01-01 (×9): 6 via TOPICAL

## 2016-12-24 NOTE — Clinical Social Work Note (Signed)
Clinical Social Work Assessment  Patient Details  Name: [name] [name] MRN: [id number] Date of Birth: 12/29/1960  Date of referral:  12/24/16               Reason for consult:  Facility Placement, Discharge Planning                Permission sought to share information with:  Case Manager, Customer service manager, Family Supports Permission granted to share information::  Yes, Verbal Permission Granted  Name::        Agency::     Relationship::  wife: Doctor, hospital Information:     Housing/Transportation Living arrangements for the past 2 months:  Tax adviser of Information:  Medical Team, Tourist information centre manager, Spouse Patient Interpreter Needed:  None Criminal Activity/Legal Involvement Pertinent to Current Situation/Hospitalization:  No - Comment as needed Significant Relationships:  Other Family Members, Spouse Lives with:  Spouse Do you feel safe going back to the place where you live?  No Need for family participation in patient care:  Yes (Comment)  Care giving concerns:  Patient unable to complete assessment secondary to confusion, HOH, and speech issues.  Spoke with wife via phone. 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  Reports when he found out from the state he was MR he tore up all the papers, thus there is no record of documentation per her report.  Reports he lives in the apartment with wife and other family and he typically stays in the bed all day.  He does not drive, but can walk around unassisted. He can typically do his ADLs and feeding independently.  Wife reports he has been in long term ALF placement in past and LCSW was able to see past placements starting in 2012.  Reports at this time she wants short term SNF in the [place] area if possible.   Social Worker assessment / plan:  LCSW completed assessment and educated wife on role and assistance while in hospital.  Wife reports patient is not at  baseline and agreeable to recommendation or SNF.  Wife discussed that patient has been to placement in the past, but nothing recent.  She reports he had medicaid and is ran out, but she recently re-established it with [facility].  LCSW explained insurance authorization and payment if denied. LCSW submitted for insurance authorization and patient is managed by [facility] per [facility].  Awaiting review. Passar is also pending manual review due to mental health and mental retardation dx.  Passar has been submitted.   LCSW has sent out referrals and will follow up once bed offers are pending.  Will continue to follow.  Employment status:  Disabled (Comment on whether or not currently receiving Disability) Insurance information:  Programmer, applications (medicaid pending per wife, just re-applied) PT Recommendations:  [place] / Referral to community resources:  [place]  Patient/Family's Response to care:  Agreeable to plan  Patient/Family's Understanding of and Emotional Response to Diagnosis, Current Treatment, and Prognosis:  Still assessing, family seems to be aware of current conditions, but patient remains confused.  Emotional Assessment Appearance:  Appears stated age Attitude/Demeanor/Rapport:  Unable to Assess (confused) Affect (typically observed):  Anxious Orientation:  Oriented to Self Alcohol / Substance use:  Not Applicable Psych involvement (Current and /or in the community):  Yes (Comment)  Discharge Needs  Concerns to be addressed:  Adjustment to Illness, Financial / Insurance Concerns Readmission within the  last 30 days:  Yes Current discharge risk:  None Barriers to Discharge:  Ship broker, Continued Medical Work up, Other (pending passar)   [place], LCSW 12/24/2016, 11:17 AM

## 2016-12-24 NOTE — Progress Notes (Signed)
PHARMACY - PHYSICIAN COMMUNICATION CRITICAL VALUE ALERT - BLOOD CULTURE IDENTIFICATION (BCID)  Results for orders placed or performed during the hospital encounter of 12/21/16  Blood Culture ID Panel (Reflexed) (Collected: 12/22/2016  5:40 PM)  Result Value Ref Range   Enterococcus species NOT DETECTED NOT DETECTED   Listeria monocytogenes NOT DETECTED NOT DETECTED   Staphylococcus species NOT DETECTED NOT DETECTED   Staphylococcus aureus NOT DETECTED NOT DETECTED   Streptococcus species DETECTED (A) NOT DETECTED   Streptococcus agalactiae NOT DETECTED NOT DETECTED   Streptococcus pneumoniae NOT DETECTED NOT DETECTED   Streptococcus pyogenes NOT DETECTED NOT DETECTED   Acinetobacter baumannii NOT DETECTED NOT DETECTED   Enterobacteriaceae species NOT DETECTED NOT DETECTED   Enterobacter cloacae complex NOT DETECTED NOT DETECTED   Escherichia coli NOT DETECTED NOT DETECTED   Klebsiella oxytoca NOT DETECTED NOT DETECTED   Klebsiella pneumoniae NOT DETECTED NOT DETECTED   Proteus species NOT DETECTED NOT DETECTED   Serratia marcescens NOT DETECTED NOT DETECTED   Haemophilus influenzae NOT DETECTED NOT DETECTED   Neisseria meningitidis NOT DETECTED NOT DETECTED   Pseudomonas aeruginosa NOT DETECTED NOT DETECTED   Candida albicans NOT DETECTED NOT DETECTED   Candida glabrata NOT DETECTED NOT DETECTED   Candida krusei NOT DETECTED NOT DETECTED   Candida parapsilosis NOT DETECTED NOT DETECTED   Candida tropicalis NOT DETECTED NOT DETECTED    Name of physician (or Provider) Contacted: Dr. Roderic Palau  Reviewed results with Dr. Roderic Palau. 1 of 2 BCX with Strep species. Patient to get LP today. Wants to continue with Merrem and Vancomycin for additional 24 more hours and re-evaluate then consider deescalation to Ceftriaxone.  Thanks for the opportunity to participate in the care of this patient,  Isac Sarna, BS [place], [place] Clinical Pharmacist Pager [phone]  12/24/2016  3:15 PM

## 2016-12-24 NOTE — NC FL2 (Addendum)
[place] LEVEL OF CARE SCREENING TOOL     IDENTIFICATION  Patient Name: [name] [name] Birthdate: January 22, 1961 Sex: male Admission Date (Current Location): 12/21/2016  [facility] and [place] Number:  [employer] and Address:  [facility] [address] [place]      Provider Number: [id number]  Attending Physician Name and Address:  Kathie Dike, MD  Relative Name and Phone Number:       Current Level of Care: Hospital Recommended Level of Care: [place] Prior Approval Number:    Date Approved/Denied:   PASRR Number:   [id number]   (May 6,2018- January 25, 2017)  Discharge Plan: SNF    Current Diagnoses: Patient Active Problem List   Diagnosis Date Noted  . Fall 12/21/2016  . Cerebral contusion ([place]) 12/21/2016  . Encephalopathy 12/21/2016  . Pressure injury of skin 12/14/2016  . Anxiety   . Suicidal ideation   . Altered mental state 12/07/2015  . Acute hepatic encephalopathy ([place]) 08/02/2013  . Hypokalemia 02/01/2013  . Hyperammonemia ([place]) 01/31/2013  . Urinary incontinence 01/31/2013  . Pancytopenia ([place]) 09/17/2012  . Edema of right lower extremity 04/28/2012  . Hepatic encephalopathy ([place]) 04/28/2012  . Arthritis of ankle or foot, degenerative 04/26/2012  . Hematemesis 12/07/2011  . Thrombocytopenia ([place]) 12/07/2011  . Anemia 12/07/2011  . COPD (chronic obstructive pulmonary disease) ([place]) 08/25/2011  . DM (diabetes mellitus) ([place]) 08/25/2011  . HTN (hypertension) 08/25/2011  . Coagulopathy ([place]) 08/25/2011  . Cirrhosis ([place]) 08/25/2011  . Depression 08/25/2011  . Seizure disorder ([place]) 08/25/2011  . GERD (gastroesophageal reflux disease) 08/25/2011  . Tobacco abuse 08/25/2011  . Ankle fracture, bimalleolar, closed 08/04/2011    Orientation RESPIRATION BLADDER Height & Weight     Self, Situation, Place  Normal Incontinent, Indwelling catheter Weight: 216 lb 4.3 oz (98.1 kg) Height:  5'  8" (172.7 cm)  BEHAVIORAL SYMPTOMS/MOOD NEUROLOGICAL BOWEL NUTRITION STATUS    Convulsions/Seizures Continent Diet (see DC summary)  NPO currently:    AMBULATORY STATUS COMMUNICATION OF NEEDS Skin   Extensive Assist Verbally (difficult to understand) Other (Comment)   Stage I -Intact skin with non-blanchable redness of a localized area usually over a bony prominence  (buttocks)  Contusion to crown of head, slightly bleeding                      Personal Care Assistance Level of Assistance  Bathing, Feeding, Dressing Bathing Assistance: Limited assistance Feeding assistance: Independent Dressing Assistance: Limited assistance     Functional Limitations Info  Sight, Hearing, Speech (blind in right eye, speech garbled, HOH) Sight Info: Impaired Hearing Info: Impaired Speech Info: Impaired    SPECIAL CARE FACTORS FREQUENCY  PT (By licensed PT), OT (By licensed OT)     PT Frequency: 5x OT Frequency: 5x            Contractures Contractures Info: Not present    Additional Factors Info  Code Status, Allergies, Psychotropic, Insulin Sliding Scale Code Status Info: DNR Allergies Info: Penicillins  Psychotropic Info: (XANAX) 0.5 MG tabletTake 0.5 mg by mouth 3 (three) times daily as needed for anxiety,  gabapentin (NEURONTIN) 300 MG capsuleTake 300 mg by mouth 3 (three) times daily.,   Insulin Sliding Scale Info: Insulin Detemir (LEVEMIR FLEXPEN) 100 UNIT/ML Pen       Current Medications (12/24/2016):  This is the current hospital active medication list Current Facility-Administered Medications  Medication Dose Route Frequency Provider Last Rate  Last Dose  . acetaminophen (TYLENOL) suppository 650 mg  650 mg Rectal Q4H PRN Kathie Dike, MD   650 mg at 12/23/16 0014  . acetaminophen (TYLENOL) tablet 650 mg  650 mg Oral Q4H PRN Orvan Falconer, MD      . ALPRAZolam Duanne Moron) tablet 0.25 mg  0.25 mg Oral BID PRN Albertine Patricia, MD   0.25 mg at 12/21/16 2109  . chlorhexidine  (PERIDEX) 0.12 % solution 15 mL  15 mL Mouth Rinse BID Albertine Patricia, MD   15 mL at 12/24/16 1031  . Chlorhexidine Gluconate Cloth 2 % PADS 6 each  6 each Topical Daily Kathie Dike, MD   6 each at 12/24/16 1000  . ezetimibe (ZETIA) tablet 10 mg  10 mg Oral Daily Albertine Patricia, MD   10 mg at 12/24/16 1031  . fluconazole (DIFLUCAN) tablet 100 mg  100 mg Oral Daily Albertine Patricia, MD   100 mg at 12/24/16 1031  . hydrALAZINE (APRESOLINE) injection 10 mg  10 mg Intravenous Q6H PRN Kathie Dike, MD   10 mg at 12/22/16 2326  . ipratropium-albuterol (DUONEB) 0.5-2.5 (3) MG/3ML nebulizer solution 3 mL  3 mL Nebulization Q6H Kathie Dike, MD   3 mL at 12/24/16 0811  . ketorolac (TORADOL) 30 MG/ML injection 30 mg  30 mg Intravenous Q6H PRN Kathie Dike, MD   30 mg at 12/23/16 0321  . lactulose (CHRONULAC) 10 GM/15ML solution 40 g  40 g Oral TID Kathie Dike, MD   40 g at 12/24/16 1031  . MEDLINE mouth rinse  15 mL Mouth Rinse q12n4p Albertine Patricia, MD   15 mL at 12/23/16 1754  . meropenem (MERREM) 1 g in sodium chloride 0.9 % 100 mL IVPB  1 g Intravenous Q8H Kathie Dike, MD   Stopped at 12/23/16 2148  . metoprolol (LOPRESSOR) tablet 50 mg  50 mg Oral QHS Albertine Patricia, MD   50 mg at 12/21/16 2109  . metoprolol tartrate (LOPRESSOR) tablet 25 mg  25 mg Oral Daily Albertine Patricia, MD   25 mg at 12/24/16 1031  . multivitamin with minerals tablet 1 tablet  1 tablet Oral Daily Albertine Patricia, MD   1 tablet at 12/24/16 1031  . nystatin (MYCOSTATIN/NYSTOP) topical powder   Topical TID Albertine Patricia, MD      . pantoprazole (PROTONIX) EC tablet 40 mg  40 mg Oral Daily Albertine Patricia, MD   40 mg at 12/24/16 1031  . potassium chloride SA (K-DUR,KLOR-CON) CR tablet 20 mEq  20 mEq Oral BID Albertine Patricia, MD   20 mEq at 12/24/16 1031  . rifaximin (XIFAXAN) tablet 550 mg  550 mg Oral BID Kathie Dike, MD   550 mg at 12/24/16 1031  . sodium chloride flush (NS) 0.9 %  injection 10-40 mL  10-40 mL Intracatheter Q12H Kathie Dike, MD   10 mL at 12/24/16 1032  . sodium chloride flush (NS) 0.9 % injection 10-40 mL  10-40 mL Intracatheter PRN Kathie Dike, MD      . vancomycin (VANCOCIN) 1,250 mg in sodium chloride 0.9 % 250 mL IVPB  1,250 mg Intravenous Q12H Kathie Dike, MD 166.7 mL/hr at 12/24/16 0435 1,250 mg at 12/24/16 0435     Discharge Medications: Please see discharge summary for a list of discharge medications.  Relevant Imaging Results:  Relevant Lab Results:   Additional Information SSN:  [id number]   Patient is recieving IV Antibiotics and has a PICC  Line Placed.  Lilly Cove, LCSW

## 2016-12-24 NOTE — Procedures (Signed)
Preprocedure Dx: AMS Postprocedure Dx: AMS Procedure:  Fluoroscopically guided lumbar puncture Radiologist:  Thornton Papas Anesthesia:  3 ml of 1% lidocaine Specimen:  9.5 mll CSF, clear ? Slightly xanthochromic EBL:   < 1 ml Opening pressure: Not measured, subjectively normal Complications: None

## 2016-12-24 NOTE — Clinical Social Work Placement (Signed)
   CLINICAL SOCIAL WORK PLACEMENT  NOTE  Date:  12/24/2016  Patient Details  Name: [name] [name] MRN: [id number] Date of Birth: 05/23/1961  Clinical Social Work is seeking post-discharge placement for this patient at the [place] level of care (*CSW will initial, date and re-position this form in  chart as items are completed):  Yes   Patient/family provided with [place] Work Department's list of facilities offering this level of care within the geographic area requested by the patient (or if unable, by the patient's family).  Yes   Patient/family informed of their freedom to choose among providers that offer the needed level of care, that participate in Medicare, Medicaid or managed care program needed by the patient, have an available bed and are willing to accept the patient.  Yes   Patient/family informed of [facility]'s ownership interest in [facility] and [facility], as well as of the fact that they are under no obligation to receive care at these facilities.  PASRR submitted to EDS on 12/24/16     PASRR number received on       Existing PASRR number confirmed on       FL2 transmitted to all facilities in geographic area requested by pt/family on 12/24/16     FL2 transmitted to all facilities within larger geographic area on       Patient informed that his/her managed care company has contracts with or will negotiate with certain facilities, including the following:            Patient/family informed of bed offers received.  Patient chooses bed at       Physician recommends and patient chooses bed at      Patient to be transferred to   on  .  Patient to be transferred to facility by       Patient family notified on   of transfer.  Name of family member notified:        PHYSICIAN Please sign FL2, Please sign DNR     Additional Comment:    _______________________________________________ Lilly Cove,  LCSW 12/24/2016, 11:12 AM

## 2016-12-24 NOTE — Progress Notes (Addendum)
Inpatient Diabetes Program Recommendations  AACE/ADA: New Consensus Statement on Inpatient Glycemic Control (2015)  Target Ranges:  Prepandial:   less than 140 mg/dL      Peak postprandial:   less than 180 mg/dL (1-2 hours)      Critically ill patients:  140 - 180 mg/dL   Lab Results  Component Value Date   GLUCAP 106 (H) 12/22/2016   HGBA1C 5.9 (H) 12/07/2015    Review of Glycemic Control  Diabetes history: Noted hx of DM Outpatient Diabetes medications: none Current orders for Inpatient glycemic control:   Inpatient Diabetes Program Recommendations:    Noted HgbA1C of 5.9 (pre-dm)  (hx of thrombocytopenia may effect results of HgbA1C) Lab glucose this early am of 219 mg/dL.  May want to consider orders cbg's and sensitive correction tidwc.  Thank you Rosita Kea, RN, MSN, CDE  Diabetes Inpatient Program Office: [phone] Pager: [phone] 8:00 am to 5:00 pm

## 2016-12-24 NOTE — Progress Notes (Signed)
PROGRESS NOTE    VERLAND SPRINKLE  HER:[id number] DOB: [birth year] DOA: 12/21/2016 PCP: Hermine Messick, MD    Brief Narrative:  56 year old male with history of [name] cirrhosis and repeated admissions for hepatic encephalopathy related to medication noncompliance, was recently discharged on 4/25 after being treated for hepatic encephalopathy. He was reportedly doing well after discharge, the following day became increasingly confused again. He began having multiple falls. He was brought back to the ER on 5/1 and admitted for recurrent encephalopathy. CT scan of the head showed scalp hematoma as well as parenchymal contusion. Ammonia was mildly elevated. He did not have any obvious signs of infection. He was admitted for further evaluation.   Assessment & Plan:   Active Problems:   COPD (chronic obstructive pulmonary disease) (HCC)   HTN (hypertension)   Cirrhosis (HCC)   Thrombocytopenia (HCC)   Hyperammonemia (HCC)   Altered mental state   Anxiety   Fall   Cerebral contusion (HCC)   Encephalopathy   1. Acute encephalopathy. Unclear etiology. Ammonia level has been mildly elevated, but it is unclear if this degree of elevation has clinical significance. He was taking benzodiazepines prior to admission, but this dose has been decreased since admission and effectively, patient has not been receiving any benzos since admission. CT head did show small subarachnoid hemorrhage, but this was unchanged on follow up CT. ABG does not show elevated PCO2. He was having fevers without any clear source of infection. Urinalysis, chest xray and abdominal exam are benign. Blood culture positive for strep species. He was started on intravenous antibiotics and appears to be improving today. Neurology following. Probable lumbar puncture later today.  2. Cerebral contusion. ED physician had discussed case with neurosurgery on call at the time of admission and no surgery was recommended at that time. It was felt  patient could be monitored at [facility]. Continue neuro checks. CT head was repeated on 5/2 with no significant changes.  3. Hyperammonemia. Patient has had recurrent admissions for hepatic encephalopathy due to noncompliance with lactulose. He is receiving lactulose enemas (since he is unable to take po meds). He is having bowel movements. Ammonia level has been stable.  4. Nash cirrhosis. Follow-up with GI as an outpatient  5. Thrombocytopenia. Related to liver disease/infection. No signs of bleeding. Continue to monitor  6. Hypertension. Currently on metoprolol. Will use when necessary IV hydralazine for now until patient is able to take oral medications.  7. Multiple falls. Related to generalized weakness and encephalopathy. Physical therapy has seen the patient and recommends SNF.  8. Fever/Bacteremia. Patient had one positive blood culture for strep species. Repeat blood cultures are in process. Source of bacteremia is not entirely clear. He is on broad spectrum antibiotics.    DVT prophylaxis: scd Code Status: DNR Family Communication: discussed with wife over the phone Disposition Plan: SNF placement on discharge   Consultants:   neurology  Procedures:     Antimicrobials:   Vancomycin 5/2>>  Meropenem 5/2>>   Subjective: Patient is more awake and interactive today. He says he is thirsty. Wants to go home.  Objective: Vitals:   12/24/16 0500 12/24/16 0600 12/24/16 0800 12/24/16 0811  BP: (!) 158/76 (!) 147/69    Pulse: 94 93    Resp: 11 16    Temp:   98.2 F (36.8 C)   TempSrc:   Axillary   SpO2: 100% 99%  99%  Weight: 98.1 kg (216 lb 4.3 oz)     Height:  Intake/Output Summary (Last 24 hours) at 12/24/16 0922 Last data filed at 12/24/16 9702  Gross per 24 hour  Intake             4967 ml  Output              650 ml  Net             4317 ml   Filed Weights   12/21/16 1116 12/23/16 1209 12/24/16 0500  Weight: 113.4 kg (250 lb) 99.3 kg  (218 lb 14.7 oz) 98.1 kg (216 lb 4.3 oz)    Examination:  General exam: Alert, awake, no distress Respiratory system: Clear to auscultation. Respiratory effort normal. Cardiovascular system:RRR. No murmurs, rubs, gallops. Gastrointestinal system: Abdomen is nondistended, soft and nontender. No organomegaly or masses felt. Normal bowel sounds heard. Central nervous system:  No focal neurological deficits. Extremities: No C/C/E, +pedal pulses Skin: No rashes, lesions or ulcers Psychiatry: awake, conversant, confused  Data Reviewed: I have personally reviewed following labs and imaging studies  CBC:  Recent Labs Lab 12/21/16 1125 12/22/16 0502 12/23/16 0433 12/24/16 0526  WBC 4.9 7.9 6.0 3.4*  NEUTROABS 3.4  --   --   --   HGB 13.2 12.5* 12.7* 11.0*  HCT 40.4 38.0* 38.5* 34.7*  MCV 97.1 96.0 96.3 99.4  PLT 63* 59* 42* 74*   Basic Metabolic Panel:  Recent Labs Lab 12/21/16 1125 12/22/16 0502 12/23/16 0433 12/24/16 0526  NA 138 136 139 149*  K 4.2 3.9 3.2* 3.5  CL 101 104 108 117*  CO2 30 24 22 24   GLUCOSE 101* 105* 160* 219*  BUN 10 11 16 17   CREATININE 0.81 0.72 0.83 0.69  CALCIUM 9.1 8.6* 8.7* 8.7*   GFR: Estimated Creatinine Clearance: 115.7 mL/min (by C-G formula based on SCr of 0.69 mg/dL). Liver Function Tests:  Recent Labs Lab 12/21/16 1125 12/23/16 0433  AST 48* 41  ALT 22 17  ALKPHOS 103 83  BILITOT 2.6* 2.3*  PROT 6.8 6.5  ALBUMIN 3.4* 3.0*   No results for input(s): LIPASE, AMYLASE in the last 168 hours.  Recent Labs Lab 12/21/16 1125 12/22/16 0502 12/23/16 0433  AMMONIA 54* 56* 52*   Coagulation Profile:  Recent Labs Lab 12/21/16 1125  INR 1.33   Cardiac Enzymes: No results for input(s): CKTOTAL, CKMB, CKMBINDEX, TROPONINI in the last 168 hours. BNP (last 3 results) No results for input(s): PROBNP in the last 8760 hours. HbA1C: No results for input(s): HGBA1C in the last 72 hours. CBG:  Recent Labs Lab 12/22/16 1430    GLUCAP 106*   Lipid Profile: No results for input(s): CHOL, HDL, LDLCALC, TRIG, CHOLHDL, LDLDIRECT in the last 72 hours. Thyroid Function Tests: No results for input(s): TSH, T4TOTAL, FREET4, T3FREE, THYROIDAB in the last 72 hours. Anemia Panel: No results for input(s): VITAMINB12, FOLATE, FERRITIN, TIBC, IRON, RETICCTPCT in the last 72 hours. Sepsis Labs: No results for input(s): PROCALCITON, LATICACIDVEN in the last 168 hours.  Recent Results (from the past 240 hour(s))  MRSA PCR Screening     Status: None   Collection Time: 12/21/16  4:25 PM  Result Value Ref Range Status   MRSA by PCR NEGATIVE NEGATIVE Final    Comment:        The GeneXpert MRSA Assay (FDA approved for NASAL specimens only), is one component of a comprehensive MRSA colonization surveillance program. It is not intended to diagnose MRSA infection nor to guide or monitor treatment for MRSA infections.  Culture, blood (routine x 2)     Status: None (Preliminary result)   Collection Time: 12/22/16  5:31 PM  Result Value Ref Range Status   Specimen Description BLOOD LEFT HAND  Final   Special Requests   Final    BOTTLES DRAWN AEROBIC ONLY Blood Culture adequate volume   Culture NO GROWTH 2 DAYS  Final   Report Status PENDING  Incomplete  Culture, blood (routine x 2)     Status: None (Preliminary result)   Collection Time: 12/22/16  5:40 PM  Result Value Ref Range Status   Specimen Description LEFT ANTECUBITAL  Final   Special Requests   Final    BOTTLES DRAWN AEROBIC AND ANAEROBIC Blood Culture adequate volume   Culture  Setup Time   Final    GRAM POSITIVE COCCI Gram Stain Report Called to,Read Back By and Verified With: HOWARD C AT 1250 ON [id number] BY THOMPSON S. AEROBIC BOTTLE ONLY    Culture   Final    GRAM POSITIVE COCCI IDENTIFICATION TO FOLLOW Performed at [facility], [place] [address] [place], [place] [address]    Report Status PENDING  Incomplete  Blood Culture ID Panel (Reflexed)      Status: Abnormal   Collection Time: 12/22/16  5:40 PM  Result Value Ref Range Status   Enterococcus species NOT DETECTED NOT DETECTED Final   Listeria monocytogenes NOT DETECTED NOT DETECTED Final   Staphylococcus species NOT DETECTED NOT DETECTED Final   Staphylococcus aureus NOT DETECTED NOT DETECTED Final   Streptococcus species DETECTED (A) NOT DETECTED Final    Comment: Not Enterococcus species, Streptococcus agalactiae, Streptococcus pyogenes, or Streptococcus pneumoniae. CRITICAL RESULT CALLED TO, READ BACK BY AND VERIFIED WITH: Tana Conch RN 17:30 12/23/16 ([email or web address])    Streptococcus agalactiae NOT DETECTED NOT DETECTED Final   Streptococcus pneumoniae NOT DETECTED NOT DETECTED Final   Streptococcus pyogenes NOT DETECTED NOT DETECTED Final   Acinetobacter baumannii NOT DETECTED NOT DETECTED Final   Enterobacteriaceae species NOT DETECTED NOT DETECTED Final   Enterobacter cloacae complex NOT DETECTED NOT DETECTED Final   Escherichia coli NOT DETECTED NOT DETECTED Final   Klebsiella oxytoca NOT DETECTED NOT DETECTED Final   Klebsiella pneumoniae NOT DETECTED NOT DETECTED Final   Proteus species NOT DETECTED NOT DETECTED Final   Serratia marcescens NOT DETECTED NOT DETECTED Final   Haemophilus influenzae NOT DETECTED NOT DETECTED Final   Neisseria meningitidis NOT DETECTED NOT DETECTED Final   Pseudomonas aeruginosa NOT DETECTED NOT DETECTED Final   Candida albicans NOT DETECTED NOT DETECTED Final   Candida glabrata NOT DETECTED NOT DETECTED Final   Candida krusei NOT DETECTED NOT DETECTED Final   Candida parapsilosis NOT DETECTED NOT DETECTED Final   Candida tropicalis NOT DETECTED NOT DETECTED Final    Comment: Performed at [facility], [place] [address], [place], [place] [address]  Culture, blood (routine x 2)     Status: None (Preliminary result)   Collection Time: 12/23/16  8:09 PM  Result Value Ref Range Status   Specimen Description BLOOD PICC LINE  Final   Special  Requests   Final    BOTTLES DRAWN AEROBIC AND ANAEROBIC Blood Culture results may not be optimal due to an inadequate volume of blood received in culture bottles   Culture NO GROWTH < 12 HOURS  Final   Report Status PENDING  Incomplete  Culture, blood (routine x 2)     Status: None (Preliminary result)   Collection Time: 12/23/16  8:10  PM  Result Value Ref Range Status   Specimen Description BLOOD LEFT ANTECUBITAL  Final   Special Requests   Final    BOTTLES DRAWN AEROBIC AND ANAEROBIC Blood Culture adequate volume   Culture NO GROWTH < 12 HOURS  Final   Report Status PENDING  Incomplete         Radiology Studies: Ct Head Wo Contrast  Addendum Date: 12/22/2016   ADDENDUM REPORT: 12/22/2016 11:24 ADDENDUM: Study discussed by telephone with Dr. Jolaine Artist Jair Lindblad on 12/22/2016 at 1117 hours. Electronically Signed   By: Genevie Ann M.D.   On: 12/22/2016 11:24   Result Date: 12/22/2016 CLINICAL DATA:  56 year old male with worsening altered mental status. [place] out of bed yesterday striking the right side. Slurred speech. EXAM: CT HEAD WITHOUT CONTRAST TECHNIQUE: Contiguous axial images were obtained from the base of the skull through the vertex without intravenous contrast. COMPARISON:  Head CT 12/21/2016 and earlier. FINDINGS: Brain: Trace hyperdensity along the left aspect of the interhemispheric fissure and medial left frontal lobe on series 2, image 24 is stable and is most compatible with trace subarachnoid hemorrhage. A trace amount of parafalcine subarachnoid is also visible on the opposite side (series 2, images 22 and 23). No associated mass effect or cerebral edema. No other acute intracranial hemorrhage identified. No intraventricular blood. No midline shift, ventriculomegaly, mass effect, evidence of mass lesion, or cortically based acute infarction. Gray-white matter differentiation is within normal limits throughout the brain. Vascular: Calcified atherosclerosis at the skull base. Skull: Stable  and intact.  No acute osseous abnormality identified. Sinuses/Orbits: Previous mastoidectomies appears stable. Stable paranasal sinus pneumatization, anterior left ethmoid mucosal thickening and opacification. Other: Right phthisis bulbi. Left orbits soft tissues are stable and intact. Mild broad-based posterior convexity scalp hematoma has regressed (series 3, image 30). No other scalp hematoma or acute superficial soft tissue injury identified. IMPRESSION: 1. Small parafalcine density along the medial frontal lobes is stable an most compatible with a trace amount of posttraumatic Subarachnoid Hemorrhage. 2. No associated mass effect and no other acute intracranial abnormality. 3. Regressed mild posterior convexity scalp hematoma. No skull fracture identified. Electronically Signed: By: Genevie Ann M.D. On: 12/22/2016 11:05   Dg Chest Port 1 View  Result Date: 12/22/2016 CLINICAL DATA:  New onset of fever last night. History of DM, HTN, COPD, GERD, Cirrhosis, seizures. Pt would not wake during exam. EXAM: PORTABLE CHEST 1 VIEW COMPARISON:  12/21/2016 FINDINGS: Mild enlargement of the cardiopericardial silhouette. No mediastinal or hilar masses. No evidence of adenopathy. Lungs are clear.  No pleural effusion.  No pneumothorax. Skeletal structures are grossly intact. IMPRESSION: No acute cardiopulmonary disease. Electronically Signed   By: Lajean Manes M.D.   On: 12/22/2016 19:24        Scheduled Meds: . chlorhexidine  15 mL Mouth Rinse BID  . Chlorhexidine Gluconate Cloth  6 each Topical Daily  . ezetimibe  10 mg Oral Daily  . fluconazole  100 mg Oral Daily  . ipratropium-albuterol  3 mL Nebulization Q6H  . lactulose  300 mL Rectal TID  . mouth rinse  15 mL Mouth Rinse q12n4p  . metoprolol tartrate  50 mg Oral QHS  . metoprolol tartrate  25 mg Oral Daily  . multivitamin with minerals  1 tablet Oral Daily  . nystatin   Topical TID  . pantoprazole  40 mg Oral Daily  . potassium chloride SA  20 mEq  Oral BID  . rifaximin  550 mg Oral BID  .  sodium chloride flush  10-40 mL Intracatheter Q12H   Continuous Infusions: . meropenem (MERREM) IV Stopped (12/23/16 2148)  . vancomycin 1,250 mg (12/24/16 0435)     LOS: 2 days    Time spent: 27mns    Shikira Folino, MD [facility] Pager [phone] If 7PM-7AM, please contact night-coverage [email or web address] Password [email or web address] 12/24/2016, 9:22 AM

## 2016-12-24 NOTE — Progress Notes (Signed)
Transported to CT for LP. Pt alert and following commands

## 2016-12-24 NOTE — Progress Notes (Signed)
[place] A. Merlene Laughter, MD     [email or web address]          [name] [name] is an 56 y.o. male.   ASSESSMENT/PLAN: 1. Acute encephalopathy: The most likely etiology is from hepatic disease despite the patient having a couple episodes of fever. Spinal fluid does not indicate CNS infection.   2. Small intracranial hemorrhage which seems to be improving slightly. Unlikely to cause encephalopathy given the small size.   3. Marked thrombocytopenia:     4. Gait disorder due to #1.  5. Positive Blood culture   6. CSF findings due to intracranial hemorrhage.    GENERAL: He is calm and cooperates with evaluation.  HEENT: Neck is supple.  ABDOMEN: soft  EXTREMITIES: No edema   BACK: Normal  SKIN: Normal by inspection.    MENTAL STATUS: He is awake and alert. He does follow midline commands and to some degree appendicular commands. Speech is moderately dysarthric. Speech is more lucid and fluent. He does seem to indicate that he is in the hospital at [facility]. Oriented to 2018 and Month.   CRANIAL NERVES: Pupils R non reactive and cloudy, L round and reactive to light; extra ocular movements are full, there is no significant nystagmus; visual fields are full; upper and lower facial muscles are normal in strength and symmetric, there is no flattening of the nasolabial folds; tongue is midline; uvula is midline.  MOTOR: Bulk and tone are normal throughout. He has antigravity strength in both upper and lower extremities bilaterally.  COORDINATION: No asterixis; no tremors, no dysmetria or parkinsonism.  REFLEXES: Deep tendon reflexes are symmetrical and normal. Babinski reflexes are flexor bilaterally.   SENSATION: He does respond to painful some light bilaterally although this is reduced.       Blood pressure (!) 147/69, pulse 93, temperature 98.2 F (36.8 C), temperature source Axillary, resp. rate 16, height _0  (1.727 m), weight 216 lb 4.3 oz (98.1 kg),  SpO2 95 %.  Past Medical History:  Diagnosis Date  . Anemia   . Anxiety   . Arthritis   . Bipolar 1 disorder ([place])   . Blind right eye   . Blood clots in brain    per patient  . Cardiomegaly   . Cataract    Right eye  . Cerebrovascular disease    Right vertebral artery  . Cholelithiasis    Asymptomatic  . Chronic back pain   . Cirrhosis ([place])    suspected NASH  . Cirrhosis ([place])    diagnosed 07/2010 when presented with first episode of hepatic encephalopathy, afp on 416/13=4.8,  U/S on 12/14/11  liver stable, pt has received 2 hep A/B vaccines  . COPD (chronic obstructive pulmonary disease) ([place])   . Depression   . Diabetes mellitus   . Diverticulosis   . GERD (gastroesophageal reflux disease)   . Hard of hearing   . Hepatic encephalopathy ([place])   . Hyperlipidemia   . Hypertension   . Mental retardation   . Seizures ([place])   . Thrombocytopenia ([place])     Past Surgical History:  Procedure Laterality Date  . CAST APPLICATION  09/30/3660   Procedure: MINOR CAST APPLICATION;  Surgeon: [name], MD;  Location: [facility];  Service: Orthopedics;  Laterality: Right;  no anesthesia , procedure room do not need or room !  . COLONOSCOPY  12/15/11   colonic divericulosis;poor prep, ACBE  recommended but has not been done  . ear operations    .  ESOPHAGOGASTRODUODENOSCOPY  02/02/2011   Rourk-Normal esophagus.  No varices/Question mild portal gastropathy, extrinsic compression on the antrum, lesser curvature of uncertain significance, some minimally  nodular mucosa status post biopsy, patent pylorus, normal duodenum 1 and duodenum 2.  . ESOPHAGOGASTRODUODENOSCOPY  12/15/11   Rourk-->mild changes of portal gastropathy, gastric/duodenal erosions s/p bx  (reactive changes with mild chronic inflammation. No H.pylori  . FOOT SURGERY    . ORIF ANKLE FRACTURE  08/27/2011   Procedure: OPEN REDUCTION INTERNAL FIXATION (ORIF) ANKLE FRACTURE;  Surgeon: [name], MD;  Location: [facility];   Service: Orthopedics;  Laterality: Right;  . TONSILLECTOMY      Family History  Problem Relation Age of Onset  . Heart failure Mother   . Thyroid disease Mother   . Cancer Father   . Asthma Brother   . Heart attack Brother   . Cirrhosis Brother     EtOH  . Colon cancer Maternal Aunt     Social History:  reports that he has been smoking Cigarettes.  He has a 30.00 pack-year smoking history. He has never used smokeless tobacco. He reports that he does not drink alcohol or use drugs.  Allergies:  Allergies  Allergen Reactions  . Penicillins Rash    Has patient had a PCN reaction causing immediate rash, facial/tongue/throat swelling, SOB or lightheadedness with hypotension: Yes Has patient had a PCN reaction causing severe rash involving mucus membranes or skin necrosis: No Has patient had a PCN reaction that required hospitalization No Has patient had a PCN reaction occurring within the last 10 years: No If all of the above answers are "NO", then may proceed with Cephalosporin use.     Medications: Prior to Admission medications   Medication Sig Start Date End Date Taking? Authorizing Provider  ALPRAZolam Duanne Moron) 0.5 MG tablet Take 0.5 mg by mouth 3 (three) times daily as needed for anxiety.   Yes Historical Provider, MD  aspirin EC 81 MG tablet Take 81 mg by mouth daily.   Yes Historical Provider, MD  ezetimibe (ZETIA) 10 MG tablet Take 10 mg by mouth daily.   Yes Historical Provider, MD  gabapentin (NEURONTIN) 300 MG capsule Take 300 mg by mouth 3 (three) times daily.   Yes Historical Provider, MD  lactulose (CHRONULAC) 10 GM/15ML solution Take 60 mLs (40 g total) by mouth 3 (three) times daily. Titrate for 3-4 bowel motions daily. 12/15/16  Yes Albertine Patricia, MD  metoprolol tartrate (LOPRESSOR) 25 MG tablet Take 25 mg by mouth 3 (three) times daily. Take 25 mg in the morning and 50 mg at night   Yes Historical Provider, MD  Multiple Vitamin (MULTIVITAMIN WITH MINERALS) TABS  Take 1 tablet by mouth daily.   Yes Historical Provider, MD  omeprazole (PRILOSEC) 20 MG capsule Take 20 mg by mouth daily.   Yes Historical Provider, MD  potassium chloride SA (K-DUR,KLOR-CON) 20 MEQ tablet Take 20 mEq by mouth 2 (two) times daily.   Yes Historical Provider, MD    Scheduled Meds: . chlorhexidine  15 mL Mouth Rinse BID  . Chlorhexidine Gluconate Cloth  6 each Topical Daily  . ezetimibe  10 mg Oral Daily  . fluconazole  100 mg Oral Daily  . ipratropium-albuterol  3 mL Nebulization Q6H  . lactulose  40 g Oral TID  . mouth rinse  15 mL Mouth Rinse q12n4p  . metoprolol tartrate  50 mg Oral QHS  . metoprolol tartrate  25 mg Oral Daily  . multivitamin with minerals  1 tablet Oral Daily  . nystatin   Topical TID  . pantoprazole  40 mg Oral Daily  . potassium chloride SA  20 mEq Oral BID  . rifaximin  550 mg Oral BID  . sodium chloride flush  10-40 mL Intracatheter Q12H   Continuous Infusions: . meropenem (MERREM) IV Stopped (12/24/16 1625)  . vancomycin 1,250 mg (12/24/16 1714)   PRN Meds:.acetaminophen, acetaminophen, ALPRAZolam, hydrALAZINE, ketorolac, sodium chloride flush     Results for orders placed or performed during the hospital encounter of 12/21/16 (from the past 48 hour(s))  CBC     Status: Abnormal   Collection Time: 12/23/16  4:33 AM  Result Value Ref Range   WBC 6.0 4.0 - 10.5 K/uL   RBC 4.00 (L) 4.22 - 5.81 MIL/uL   Hemoglobin 12.7 (L) 13.0 - 17.0 g/dL   HCT 38.5 (L) 39.0 - 52.0 %   MCV 96.3 78.0 - 100.0 fL   MCH 31.8 26.0 - 34.0 pg   MCHC 33.0 30.0 - 36.0 g/dL   RDW 15.9 (H) 11.5 - 15.5 %   Platelets 42 (L) 150 - 400 K/uL    Comment: SPECIMEN CHECKED FOR CLOTS PLATELET COUNT CONFIRMED BY SMEAR   Comprehensive metabolic panel     Status: Abnormal   Collection Time: 12/23/16  4:33 AM  Result Value Ref Range   Sodium 139 135 - 145 mmol/L   Potassium 3.2 (L) 3.5 - 5.1 mmol/L    Comment: DELTA CHECK NOTED   Chloride 108 101 - 111 mmol/L    CO2 22 22 - 32 mmol/L   Glucose, Bld 160 (H) 65 - 99 mg/dL   BUN 16 6 - 20 mg/dL   Creatinine, Ser 0.83 0.61 - 1.24 mg/dL   Calcium 8.7 (L) 8.9 - 10.3 mg/dL   Total Protein 6.5 6.5 - 8.1 g/dL   Albumin 3.0 (L) 3.5 - 5.0 g/dL   AST 41 15 - 41 U/L   ALT 17 17 - 63 U/L   Alkaline Phosphatase 83 38 - 126 U/L   Total Bilirubin 2.3 (H) 0.3 - 1.2 mg/dL   GFR calc non Af Amer >60 >60 mL/min   GFR calc Af Amer >60 >60 mL/min    Comment: (NOTE) The eGFR has been calculated using the CKD EPI equation. This calculation has not been validated in all clinical situations. eGFR's persistently <60 mL/min signify possible Chronic Kidney Disease.    Anion gap 9 5 - 15  Ammonia     Status: Abnormal   Collection Time: 12/23/16  4:33 AM  Result Value Ref Range   Ammonia 52 (H) 9 - 35 umol/L  Type and screen [facility]     Status: None   Collection Time: 12/23/16  1:46 PM  Result Value Ref Range   ABO/RH(D) A POS    Antibody Screen NEG    Sample Expiration 12/26/2016   Prepare Pheresed Platelets     Status: None (Preliminary result)   Collection Time: 12/23/16  1:48 PM  Result Value Ref Range   Unit Number [id number]    Blood Component Type PLTP LR2 PAS    Unit division 00    Status of Unit ISSUED,FINAL    Transfusion Status OK TO TRANSFUSE    Unit Number [id number]    Blood Component Type PLTPHER LR3    Unit division 00    Status of Unit ISSUED    Transfusion Status OK TO TRANSFUSE   Culture, blood (routine x 2)  Status: None (Preliminary result)   Collection Time: 12/23/16  8:09 PM  Result Value Ref Range   Specimen Description BLOOD PICC LINE    Special Requests      BOTTLES DRAWN AEROBIC AND ANAEROBIC Blood Culture results may not be optimal due to an inadequate volume of blood received in culture bottles   Culture NO GROWTH < 12 HOURS    Report Status PENDING   Culture, blood (routine x 2)     Status: None (Preliminary result)   Collection Time: 12/23/16  8:10 PM    Result Value Ref Range   Specimen Description BLOOD LEFT ANTECUBITAL    Special Requests      BOTTLES DRAWN AEROBIC AND ANAEROBIC Blood Culture adequate volume   Culture NO GROWTH < 12 HOURS    Report Status PENDING   CBC     Status: Abnormal   Collection Time: 12/24/16  5:26 AM  Result Value Ref Range   WBC 3.4 (L) 4.0 - 10.5 K/uL   RBC 3.49 (L) 4.22 - 5.81 MIL/uL   Hemoglobin 11.0 (L) 13.0 - 17.0 g/dL   HCT 34.7 (L) 39.0 - 52.0 %   MCV 99.4 78.0 - 100.0 fL   MCH 31.5 26.0 - 34.0 pg   MCHC 31.7 30.0 - 36.0 g/dL   RDW 16.4 (H) 11.5 - 15.5 %   Platelets 74 (L) 150 - 400 K/uL    Comment: SPECIMEN CHECKED FOR CLOTS  Basic metabolic panel     Status: Abnormal   Collection Time: 12/24/16  5:26 AM  Result Value Ref Range   Sodium 149 (H) 135 - 145 mmol/L    Comment: DELTA CHECK NOTED   Potassium 3.5 3.5 - 5.1 mmol/L   Chloride 117 (H) 101 - 111 mmol/L   CO2 24 22 - 32 mmol/L   Glucose, Bld 219 (H) 65 - 99 mg/dL   BUN 17 6 - 20 mg/dL   Creatinine, Ser 0.69 0.61 - 1.24 mg/dL   Calcium 8.7 (L) 8.9 - 10.3 mg/dL   GFR calc non Af Amer >60 >60 mL/min   GFR calc Af Amer >60 >60 mL/min    Comment: (NOTE) The eGFR has been calculated using the CKD EPI equation. This calculation has not been validated in all clinical situations. eGFR's persistently <60 mL/min signify possible Chronic Kidney Disease.    Anion gap 8 5 - 15  Glucose, CSF     Status: Abnormal   Collection Time: 12/24/16  3:08 PM  Result Value Ref Range   Glucose, CSF 94 (H) 40 - 70 mg/dL  Protein, CSF     Status: Abnormal   Collection Time: 12/24/16  3:08 PM  Result Value Ref Range   Total  Protein, CSF 58 (H) 15 - 45 mg/dL  CSF cell count with differential     Status: Abnormal   Collection Time: 12/24/16  3:08 PM  Result Value Ref Range   Tube # 4    Color, CSF STRAW (A) COLORLESS   Appearance, CSF CLEAR CLEAR   Supernatant CLEAR    RBC Count, CSF 1,020 (H) 0 /cu mm   WBC, CSF 3 0 - 5 /cu mm   Segmented  Neutrophils-CSF TOO FEW TO COUNT, SMEAR AVAILABLE FOR REVIEW 0 - 6 %   Lymphs, CSF TOO FEW TO COUNT, SMEAR AVAILABLE FOR REVIEW 40 - 80 %   Monocyte-Macrophage-Spinal Fluid TOO FEW TO COUNT, SMEAR AVAILABLE FOR REVIEW 15 - 45 %   Eosinophils, CSF TOO  FEW TO COUNT, SMEAR AVAILABLE FOR REVIEW 0 - 1 %   Other Cells, CSF TOO FEW TO COUNT, SMEAR AVAILABLE FOR REVIEW   CSF culture     Status: None (Preliminary result)   Collection Time: 12/24/16  3:08 PM  Result Value Ref Range   Specimen Description CSF    Special Requests NONE    Gram Stain      NO ORGANISMS SEEN WBC PRESENT, PREDOMINANTLY PMN Gram Stain Report Called to,Read Back By and Verified With: GAMMONS S. AT 1615 ON [id number] BY THOMPSON S.    Culture PENDING    Report Status PENDING     Studies/Results:  HEAD CT FINDINGS: Brain: Trace hyperdensity along the left aspect of the interhemispheric fissure and medial left frontal lobe on series 2, image 24 is stable and is most compatible with trace subarachnoid hemorrhage. A trace amount of parafalcine subarachnoid is also visible on the opposite side (series 2, images 22 and 23). No associated mass effect or cerebral edema.  No other acute intracranial hemorrhage identified. No intraventricular blood.  No midline shift, ventriculomegaly, mass effect, evidence of mass lesion, or cortically based acute infarction. Gray-white matter differentiation is within normal limits throughout the brain.  Vascular: Calcified atherosclerosis at the skull base.  Skull: Stable and intact.  No acute osseous abnormality identified.  Sinuses/Orbits: Previous mastoidectomies appears stable. Stable paranasal sinus pneumatization, anterior left ethmoid mucosal thickening and opacification.  Other: Right phthisis bulbi. Left orbits soft tissues are stable and intact. Mild broad-based posterior convexity scalp hematoma has regressed (series 3, image 30). No other scalp hematoma or  acute superficial soft tissue injury identified.  IMPRESSION: 1. Small parafalcine density along the medial frontal lobes is stable an most compatible with a trace amount of posttraumatic Subarachnoid Hemorrhage. 2. No associated mass effect and no other acute intracranial abnormality. 3. Regressed mild posterior convexity scalp hematoma. No skull fracture identified.      EEG 12/15/16 This recording is abnormal due to the following reasons: 1. Moderately severe global slowing indicating a moderate to severe global encephalopathy. 2. Multiple triphasic waves which are typically seen in toxic metabolic encephalopathies particularly from hepatic failure or renal failure.      Head CT scan is reviewed in person. His compared to the previous scan. The increased brightness seen in the interhemispheric fissure is a about the same or slightly reduced on  today's scan. It is seen on 4 cuts and does not have mass effect.  Jafar Poffenberger A. Merlene Laughter, M.D.  Diplomate, Tax adviser of Psychiatry and Neurology ( Neurology). 12/24/2016, 6:52 PM

## 2016-12-25 LAB — AMMONIA: AMMONIA: 94 umol/L — AB (ref 9–35)

## 2016-12-25 LAB — BPAM PLATELET PHERESIS
BLOOD PRODUCT EXPIRATION DATE: 201805052359
BLOOD PRODUCT EXPIRATION DATE: 201805052359
ISSUE DATE / TIME: 201805031637
ISSUE DATE / TIME: 201805040211
UNIT TYPE AND RH: 6200
Unit Type and Rh: 5100

## 2016-12-25 LAB — CBC
HCT: 32.3 % — ABNORMAL LOW (ref 39.0–52.0)
Hemoglobin: 10.6 g/dL — ABNORMAL LOW (ref 13.0–17.0)
MCH: 32.1 pg (ref 26.0–34.0)
MCHC: 32.8 g/dL (ref 30.0–36.0)
MCV: 97.9 fL (ref 78.0–100.0)
PLATELETS: 78 10*3/uL — AB (ref 150–400)
RBC: 3.3 MIL/uL — AB (ref 4.22–5.81)
RDW: 16.2 % — ABNORMAL HIGH (ref 11.5–15.5)
WBC: 4.3 10*3/uL (ref 4.0–10.5)

## 2016-12-25 LAB — BASIC METABOLIC PANEL
Anion gap: 3 — ABNORMAL LOW (ref 5–15)
BUN: 10 mg/dL (ref 6–20)
CHLORIDE: 109 mmol/L (ref 101–111)
CO2: 26 mmol/L (ref 22–32)
CREATININE: 0.55 mg/dL — AB (ref 0.61–1.24)
Calcium: 8.2 mg/dL — ABNORMAL LOW (ref 8.9–10.3)
Glucose, Bld: 106 mg/dL — ABNORMAL HIGH (ref 65–99)
Potassium: 3.8 mmol/L (ref 3.5–5.1)
SODIUM: 138 mmol/L (ref 135–145)

## 2016-12-25 LAB — PREPARE PLATELET PHERESIS
UNIT DIVISION: 0
Unit division: 0

## 2016-12-25 LAB — CULTURE, BLOOD (ROUTINE X 2): SPECIAL REQUESTS: ADEQUATE

## 2016-12-25 MED ORDER — LORAZEPAM 2 MG/ML IJ SOLN
1.0000 mg | INTRAMUSCULAR | Status: DC | PRN
  Administered 2016-12-25 – 2016-12-29 (×11): 2 mg via INTRAVENOUS
  Administered 2016-12-29: 1 mg via INTRAVENOUS
  Filled 2016-12-25 (×12): qty 1

## 2016-12-25 MED ORDER — ALTEPLASE 2 MG IJ SOLR
2.0000 mg | Freq: Once | INTRAMUSCULAR | Status: AC
  Administered 2016-12-25: 2 mg
  Filled 2016-12-25: qty 2

## 2016-12-25 MED ORDER — IPRATROPIUM-ALBUTEROL 0.5-2.5 (3) MG/3ML IN SOLN
3.0000 mL | Freq: Three times a day (TID) | RESPIRATORY_TRACT | Status: DC
  Administered 2016-12-25 – 2016-12-26 (×6): 3 mL via RESPIRATORY_TRACT
  Filled 2016-12-25 (×6): qty 3

## 2016-12-25 MED ORDER — DEXTROSE 5 % IV SOLN
2.0000 g | INTRAVENOUS | Status: DC
  Administered 2016-12-25 – 2016-12-27 (×3): 2 g via INTRAVENOUS
  Filled 2016-12-25 (×5): qty 2

## 2016-12-25 MED ORDER — SODIUM CHLORIDE 0.9 % IV SOLN
INTRAVENOUS | Status: DC
  Administered 2016-12-25 – 2016-12-29 (×7): via INTRAVENOUS

## 2016-12-25 MED ORDER — ALBUTEROL SULFATE (2.5 MG/3ML) 0.083% IN NEBU: 2.5000 mg | INHALATION_SOLUTION | RESPIRATORY_TRACT | Status: DC | PRN

## 2016-12-25 MED ORDER — LORAZEPAM 2 MG/ML IJ SOLN: 1.0000 mg | Freq: Once | INTRAMUSCULAR | Status: AC

## 2016-12-25 MED ORDER — LORAZEPAM 2 MG/ML IJ SOLN
1.0000 mg | Freq: Four times a day (QID) | INTRAMUSCULAR | Status: DC | PRN
  Filled 2016-12-25: qty 1

## 2016-12-25 MED ORDER — LORAZEPAM 2 MG/ML IJ SOLN
INTRAMUSCULAR | Status: AC
  Administered 2016-12-25: 1 mg
  Filled 2016-12-25: qty 1

## 2016-12-25 NOTE — Progress Notes (Signed)
2 of the patient's ports on his PICC line will not flush or give blood return. AC notified and is not certified to administer TPA through line. Will notify next shift to resolve problem.  Margaret Pyle, RN

## 2016-12-25 NOTE — Progress Notes (Signed)
PROGRESS NOTE    [name] [name]  ION:[id number] DOB: 1961-08-10 DOA: 12/21/2016 PCP: Hermine Messick, MD    Brief Narrative:  56 year old male with history of [name] cirrhosis and repeated admissions for hepatic encephalopathy related to medication noncompliance, was recently discharged on 4/25 after being treated for hepatic encephalopathy. He was reportedly doing well after discharge, the following day became increasingly confused again. He began having multiple falls. He was brought back to the ER on 5/1 and admitted for recurrent encephalopathy. CT scan of the head showed scalp hematoma as well as parenchymal contusion. Ammonia was mildly elevated. He did not have any obvious signs of infection. He was admitted for further evaluation.   Assessment & Plan:   Active Problems:   COPD (chronic obstructive pulmonary disease) (HCC)   HTN (hypertension)   Cirrhosis (HCC)   Thrombocytopenia (HCC)   Hyperammonemia (HCC)   Altered mental state   Anxiety   Fall   Cerebral contusion (HCC)   Encephalopathy   1. Acute encephalopathy. Unclear etiology. Ammonia level has been mildly elevated, but it is unclear if this degree of elevation has clinical significance. He was taking benzodiazepines prior to admission, but this dose has been decreased since admission and effectively, patient has not been receiving any benzos since admission. CT head did show small subarachnoid hemorrhage, but this was unchanged on follow up CT. ABG did not show elevated PCO2. He was having fevers without any clear source of infection. Urinalysis, chest xray and abdominal exam are benign. Blood culture positive for strep species. He was started on intravenous antibiotics and appears to be improving. Neurology following. Lumbar puncture did not show any signs of CNS infection. Overall mental status is improving.  2. Cerebral contusion. ED physician had discussed case with neurosurgery on call at the time of admission and no  surgery was recommended at that time. It was felt patient could be monitored at [facility]. CT head was repeated on 5/2 with no significant changes. No further work up at this time.  3. Hyperammonemia. Patient has had recurrent admissions for hepatic encephalopathy due to noncompliance with lactulose. Ammonia level has trended up today. He is receiving lactulose and is having bowel movements. He is also on xifaxan. Continue current treatments.  4. Nash cirrhosis. Follow-up with GI as an outpatient  5. Thrombocytopenia. Related to liver disease/infection. No signs of bleeding. Continue to monitor  6. Hypertension. Currently on metoprolol. Blood pressure has been stable.  7. Multiple falls. Related to generalized weakness and encephalopathy. Physical therapy has seen the patient and recommends SNF.  8. Fever/Bacteremia. Patient had one out of two sets positive blood culture for strep viridans. Source of bacteremia is not entirely clear. Currently on vancomycin and meropenem. Will de escalate to rocephin    DVT prophylaxis: scd Code Status: DNR Family Communication: discussed with wife over the phone Disposition Plan: SNF placement on discharge   Consultants:   neurology  Procedures:   Lumbar puncture 5/4  Antimicrobials:   Vancomycin 5/2>>  Meropenem 5/2>>   Subjective: He is confused today. Does not know that he is in the hospital. agitated  Objective: Vitals:   12/25/16 0330 12/25/16 0400 12/25/16 0600 12/25/16 0914  BP:  131/68 122/69 124/72  Pulse:  67 65 74  Resp:  19 16   Temp: 98 F (36.7 C)     TempSrc: Oral     SpO2:  95% 95%   Weight: 102.6 kg (226 lb 3.1 oz)  Height:        Intake/Output Summary (Last 24 hours) at 12/25/16 1013 Last data filed at 12/25/16 0644  Gross per 24 hour  Intake             1720 ml  Output             1050 ml  Net              670 ml   Filed Weights   12/23/16 1209 12/24/16 0500 12/25/16 0330  Weight: 99.3 kg  (218 lb 14.7 oz) 98.1 kg (216 lb 4.3 oz) 102.6 kg (226 lb 3.1 oz)    Examination:  General exam: agitated and confused Respiratory system: Clear to auscultation. Respiratory effort normal. Cardiovascular system:RRR. No murmurs, rubs, gallops. Gastrointestinal system: Abdomen is nondistended, soft and nontender. No organomegaly or masses felt. Normal bowel sounds heard. Central nervous system: agitated. No focal neurological deficits. Extremities: No C/C/E, +pedal pulses Skin: No rashes, lesions or ulcers Psychiatry: confused.    Data Reviewed: I have personally reviewed following labs and imaging studies  CBC:  Recent Labs Lab 12/21/16 1125 12/22/16 0502 12/23/16 0433 12/24/16 0526 12/25/16 0453  WBC 4.9 7.9 6.0 3.4* 4.3  NEUTROABS 3.4  --   --   --   --   HGB 13.2 12.5* 12.7* 11.0* 10.6*  HCT 40.4 38.0* 38.5* 34.7* 32.3*  MCV 97.1 96.0 96.3 99.4 97.9  PLT 63* 59* 42* 74* 78*   Basic Metabolic Panel:  Recent Labs Lab 12/21/16 1125 12/22/16 0502 12/23/16 0433 12/24/16 0526 12/25/16 0453  NA 138 136 139 149* 138  K 4.2 3.9 3.2* 3.5 3.8  CL 101 104 108 117* 109  CO2 30 24 22 24 26   GLUCOSE 101* 105* 160* 219* 106*  BUN 10 11 16 17 10   CREATININE 0.81 0.72 0.83 0.69 0.55*  CALCIUM 9.1 8.6* 8.7* 8.7* 8.2*   GFR: Estimated Creatinine Clearance: 118.3 mL/min (A) (by C-G formula based on SCr of 0.55 mg/dL (L)). Liver Function Tests:  Recent Labs Lab 12/21/16 1125 12/23/16 0433  AST 48* 41  ALT 22 17  ALKPHOS 103 83  BILITOT 2.6* 2.3*  PROT 6.8 6.5  ALBUMIN 3.4* 3.0*   No results for input(s): LIPASE, AMYLASE in the last 168 hours.  Recent Labs Lab 12/21/16 1125 12/22/16 0502 12/23/16 0433 12/25/16 0453  AMMONIA 54* 56* 52* 94*   Coagulation Profile:  Recent Labs Lab 12/21/16 1125  INR 1.33   Cardiac Enzymes: No results for input(s): CKTOTAL, CKMB, CKMBINDEX, TROPONINI in the last 168 hours. BNP (last 3 results) No results for input(s):  PROBNP in the last 8760 hours. HbA1C: No results for input(s): HGBA1C in the last 72 hours. CBG:  Recent Labs Lab 12/22/16 1430  GLUCAP 106*   Lipid Profile: No results for input(s): CHOL, HDL, LDLCALC, TRIG, CHOLHDL, LDLDIRECT in the last 72 hours. Thyroid Function Tests: No results for input(s): TSH, T4TOTAL, FREET4, T3FREE, THYROIDAB in the last 72 hours. Anemia Panel: No results for input(s): VITAMINB12, FOLATE, FERRITIN, TIBC, IRON, RETICCTPCT in the last 72 hours. Sepsis Labs: No results for input(s): PROCALCITON, LATICACIDVEN in the last 168 hours.  Recent Results (from the past 240 hour(s))  MRSA PCR Screening     Status: None   Collection Time: 12/21/16  4:25 PM  Result Value Ref Range Status   MRSA by PCR NEGATIVE NEGATIVE Final    Comment:        The GeneXpert MRSA Assay (FDA approved  for NASAL specimens only), is one component of a comprehensive MRSA colonization surveillance program. It is not intended to diagnose MRSA infection nor to guide or monitor treatment for MRSA infections.   Culture, blood (routine x 2)     Status: None (Preliminary result)   Collection Time: 12/22/16  5:31 PM  Result Value Ref Range Status   Specimen Description BLOOD LEFT HAND  Final   Special Requests   Final    BOTTLES DRAWN AEROBIC ONLY Blood Culture adequate volume   Culture NO GROWTH 3 DAYS  Final   Report Status PENDING  Incomplete  Culture, blood (routine x 2)     Status: Abnormal   Collection Time: 12/22/16  5:40 PM  Result Value Ref Range Status   Specimen Description LEFT ANTECUBITAL  Final   Special Requests   Final    BOTTLES DRAWN AEROBIC AND ANAEROBIC Blood Culture adequate volume   Culture  Setup Time   Final    GRAM POSITIVE COCCI Gram Stain Report Called to,Read Back By and Verified With: HOWARD C AT 1250 ON [id number] BY THOMPSON S. AEROBIC BOTTLE ONLY    Culture (A)  Final    VIRIDANS STREPTOCOCCUS THE SIGNIFICANCE OF ISOLATING THIS ORGANISM FROM A SINGLE  SET OF BLOOD CULTURES WHEN MULTIPLE SETS ARE DRAWN IS UNCERTAIN. PLEASE NOTIFY THE MICROBIOLOGY DEPARTMENT WITHIN ONE WEEK IF SPECIATION AND SENSITIVITIES ARE REQUIRED. Performed at [facility], [place] [address], [place], [place] [address]    Report Status 12/25/2016 FINAL  Final  Blood Culture ID Panel (Reflexed)     Status: Abnormal   Collection Time: 12/22/16  5:40 PM  Result Value Ref Range Status   Enterococcus species NOT DETECTED NOT DETECTED Final   Listeria monocytogenes NOT DETECTED NOT DETECTED Final   Staphylococcus species NOT DETECTED NOT DETECTED Final   Staphylococcus aureus NOT DETECTED NOT DETECTED Final   Streptococcus species DETECTED (A) NOT DETECTED Final    Comment: Not Enterococcus species, Streptococcus agalactiae, Streptococcus pyogenes, or Streptococcus pneumoniae. CRITICAL RESULT CALLED TO, READ BACK BY AND VERIFIED WITH: Tana Conch RN 17:30 12/23/16 ([email or web address])    Streptococcus agalactiae NOT DETECTED NOT DETECTED Final   Streptococcus pneumoniae NOT DETECTED NOT DETECTED Final   Streptococcus pyogenes NOT DETECTED NOT DETECTED Final   Acinetobacter baumannii NOT DETECTED NOT DETECTED Final   Enterobacteriaceae species NOT DETECTED NOT DETECTED Final   Enterobacter cloacae complex NOT DETECTED NOT DETECTED Final   Escherichia coli NOT DETECTED NOT DETECTED Final   Klebsiella oxytoca NOT DETECTED NOT DETECTED Final   Klebsiella pneumoniae NOT DETECTED NOT DETECTED Final   Proteus species NOT DETECTED NOT DETECTED Final   Serratia marcescens NOT DETECTED NOT DETECTED Final   Haemophilus influenzae NOT DETECTED NOT DETECTED Final   Neisseria meningitidis NOT DETECTED NOT DETECTED Final   Pseudomonas aeruginosa NOT DETECTED NOT DETECTED Final   Candida albicans NOT DETECTED NOT DETECTED Final   Candida glabrata NOT DETECTED NOT DETECTED Final   Candida krusei NOT DETECTED NOT DETECTED Final   Candida parapsilosis NOT DETECTED NOT DETECTED Final   Candida  tropicalis NOT DETECTED NOT DETECTED Final    Comment: Performed at [facility], [place] [address], [place], [place] [address]  Culture, blood (routine x 2)     Status: None (Preliminary result)   Collection Time: 12/23/16  8:09 PM  Result Value Ref Range Status   Specimen Description BLOOD PICC LINE  Final   Special Requests   Final  BOTTLES DRAWN AEROBIC AND ANAEROBIC Blood Culture results may not be optimal due to an inadequate volume of blood received in culture bottles   Culture NO GROWTH 2 DAYS  Final   Report Status PENDING  Incomplete  Culture, blood (routine x 2)     Status: None (Preliminary result)   Collection Time: 12/23/16  8:10 PM  Result Value Ref Range Status   Specimen Description BLOOD LEFT ANTECUBITAL  Final   Special Requests   Final    BOTTLES DRAWN AEROBIC AND ANAEROBIC Blood Culture adequate volume   Culture NO GROWTH 2 DAYS  Final   Report Status PENDING  Incomplete  CSF culture     Status: None (Preliminary result)   Collection Time: 12/24/16  3:08 PM  Result Value Ref Range Status   Specimen Description CSF  Final   Special Requests NONE  Final   Gram Stain   Final    NO ORGANISMS SEEN WBC PRESENT, PREDOMINANTLY PMN Gram Stain Report Called to,Read Back By and Verified With: GAMMONS S. AT 1615 ON [id number] BY THOMPSON S.    Culture PENDING  Incomplete   Report Status PENDING  Incomplete         Radiology Studies: Dg Fluoro Guide Lumbar Puncture  Result Date: 12/24/2016 CLINICAL DATA:  Altered mental status EXAM: DIAGNOSTIC LUMBAR PUNCTURE UNDER FLUOROSCOPIC GUIDANCE FLUOROSCOPY TIME:  Fluoroscopy Time:  0 minutes 6 seconds Radiation Exposure Index (if provided by the fluoroscopic device): 2.2 mGy Number of Acquired Spot Images: Single screen capture during fluoroscopy PROCEDURE: Written informed consent was obtained from the patient's spouse. Timeout protocol followed. Patient placed prone. L4-L5 disc space was localized under fluoroscopy. Skin  prepped and draped in usual sterile fashion. Skin and soft tissues anesthetized with 2 ml of 1% lidocaine. 22 gauge needle was advanced into the spinal canal where CSF was encounter. Question CSF slightly xanthochromic. 9.5 mL of CSF was obtained in 4 tubes for requested analysis. Procedure tolerated very well by patient without immediate complication. IMPRESSION: Successful fluoroscopic guided lumbar puncture. Electronically Signed   By: Lavonia Dana M.D.   On: 12/24/2016 15:33        Scheduled Meds: . chlorhexidine  15 mL Mouth Rinse BID  . Chlorhexidine Gluconate Cloth  6 each Topical Daily  . ezetimibe  10 mg Oral Daily  . fluconazole  100 mg Oral Daily  . ipratropium-albuterol  3 mL Nebulization TID  . lactulose  40 g Oral TID  . LORazepam  1 mg Intravenous Once  . mouth rinse  15 mL Mouth Rinse q12n4p  . metoprolol tartrate  50 mg Oral QHS  . metoprolol tartrate  25 mg Oral Daily  . multivitamin with minerals  1 tablet Oral Daily  . nystatin   Topical TID  . pantoprazole  40 mg Oral Daily  . potassium chloride SA  20 mEq Oral BID  . rifaximin  550 mg Oral BID  . sodium chloride flush  10-40 mL Intracatheter Q12H   Continuous Infusions: . meropenem (MERREM) IV Stopped (12/25/16 0714)  . vancomycin Stopped (12/25/16 0630)     LOS: 3 days    Time spent: 3mns    Shamieka Gullo, MD [facility] Pager [phone] If 7PM-7AM, please contact night-coverage [email or web address] Password [email or web address] 12/25/2016, 10:13 AM

## 2016-12-26 LAB — CBC
HCT: 31.8 % — ABNORMAL LOW (ref 39.0–52.0)
Hemoglobin: 10.4 g/dL — ABNORMAL LOW (ref 13.0–17.0)
MCH: 31.7 pg (ref 26.0–34.0)
MCHC: 32.7 g/dL (ref 30.0–36.0)
MCV: 97 fL (ref 78.0–100.0)
PLATELETS: 67 10*3/uL — AB (ref 150–400)
RBC: 3.28 MIL/uL — AB (ref 4.22–5.81)
RDW: 16 % — ABNORMAL HIGH (ref 11.5–15.5)
WBC: 3.4 10*3/uL — AB (ref 4.0–10.5)

## 2016-12-26 LAB — BASIC METABOLIC PANEL
Anion gap: 3 — ABNORMAL LOW (ref 5–15)
BUN: 8 mg/dL (ref 6–20)
CALCIUM: 7.9 mg/dL — AB (ref 8.9–10.3)
CO2: 29 mmol/L (ref 22–32)
CREATININE: 0.55 mg/dL — AB (ref 0.61–1.24)
Chloride: 107 mmol/L (ref 101–111)
GFR calc non Af Amer: >60 mL/min (ref >60)
Glucose, Bld: 98 mg/dL (ref 65–99)
Potassium: 3.5 mmol/L (ref 3.5–5.1)
SODIUM: 139 mmol/L (ref 135–145)

## 2016-12-26 LAB — AMMONIA: AMMONIA: 40 umol/L — AB (ref 9–35)

## 2016-12-26 MED ORDER — RISPERIDONE 0.5 MG PO TABS
0.5000 mg | ORAL_TABLET | Freq: Two times a day (BID) | ORAL | Status: DC
  Administered 2016-12-26 – 2016-12-29 (×7): 0.5 mg via ORAL
  Filled 2016-12-26 (×7): qty 1

## 2016-12-26 MED ORDER — IPRATROPIUM-ALBUTEROL 0.5-2.5 (3) MG/3ML IN SOLN
3.0000 mL | Freq: Two times a day (BID) | RESPIRATORY_TRACT | Status: DC
  Administered 2016-12-27: 3 mL via RESPIRATORY_TRACT
  Filled 2016-12-26 (×2): qty 3

## 2016-12-26 NOTE — Progress Notes (Signed)
PROGRESS NOTE    ANTAEUS KAREL  TWK:[id number] DOB: 1961/04/20 DOA: 12/21/2016 PCP: Hermine Messick, MD    Brief Narrative:  56 year old male with history of [name] cirrhosis and repeated admissions for hepatic encephalopathy related to medication noncompliance, was recently discharged on 4/25 after being treated for hepatic encephalopathy. He was reportedly doing well after discharge, the following day became increasingly confused again. He began having multiple falls. He was brought back to the ER on 5/1 and admitted for recurrent encephalopathy. CT scan of the head showed scalp hematoma as well as parenchymal contusion. Ammonia was mildly elevated. He did not have any obvious signs of infection. He was admitted for further evaluation.   Assessment & Plan:   Active Problems:   COPD (chronic obstructive pulmonary disease) (HCC)   HTN (hypertension)   Cirrhosis (HCC)   Thrombocytopenia (HCC)   Hyperammonemia (HCC)   Altered mental state   Anxiety   Fall   Cerebral contusion (HCC)   Encephalopathy   1. Acute encephalopathy. Unclear etiology. Ammonia level has been mildly elevated, but it is unclear if this degree of elevation has clinical significance. He was taking benzodiazepines prior to admission, but this dose has been decreased since admission and effectively, patient has not been receiving any benzos since admission. CT head did show small subarachnoid hemorrhage, but this was unchanged on follow up CT. ABG did not show elevated PCO2. He was having fevers without any clear source of infection. Urinalysis, chest xray and abdominal exam are benign. Blood culture positive for strep species. He was started on intravenous antibiotics and appears to be improving. Neurology following. Lumbar puncture did not show any signs of CNS infection. Currently, he is having episodes of agitation, but I suspect to some degree this may be his baseline. Using ativan prn for agitation.  2. Cerebral  contusion. ED physician had discussed case with neurosurgery on call at the time of admission and no surgery was recommended at that time. It was felt patient could be monitored at [facility]. CT head was repeated on 5/2 with no significant changes. No further work up at this time.  3. Hyperammonemia. Patient has had recurrent admissions for hepatic encephalopathy due to noncompliance with lactulose. Ammonia level has improved today. He is receiving lactulose and is having bowel movements. He is also on xifaxan. Continue current treatments.  4. Nash cirrhosis. Follow-up with GI as an outpatient  5. Thrombocytopenia. Related to liver disease/infection. No signs of bleeding. Continue to monitor  6. Hypertension. Currently on metoprolol. Blood pressure has been stable.  7. Multiple falls. Related to generalized weakness and encephalopathy. Physical therapy has seen the patient and recommends SNF.  8. Fever/Bacteremia. Patient had one out of two sets positive blood culture for strep viridans. Source of bacteremia is not entirely clear. He is on intravenous antibiotics. Discussed with Dr. Megan Salon on call for ID. Recommended transitioning patient to oral antibiotics on discharge.   DVT prophylaxis: scd Code Status: DNR Family Communication: no family present Disposition Plan: SNF placement on discharge   Consultants:   neurology  Procedures:   Lumbar puncture 5/4  Antimicrobials:   Vancomycin 5/2>>5/5  Meropenem 5/2>>5/5  Ceftriaxone 5/5>>   Subjective: Patient is sleeping this morning. Does not wake up to voice. Was awake most of the night  Objective: Vitals:   12/26/16 0600 12/26/16 0700 12/26/16 0731 12/26/16 0809  BP: (!) 150/78 125/66    Pulse: 78 67    Resp:  14    Temp:  97.8 F (36.6 C)   TempSrc:   Axillary   SpO2: 92% (!) 88%  (!) 80%  Weight:      Height:        Intake/Output Summary (Last 24 hours) at 12/26/16 0902 Last data filed at 12/26/16  0800  Gross per 24 hour  Intake             1540 ml  Output             2200 ml  Net             -660 ml   Filed Weights   12/24/16 0500 12/25/16 0330 12/26/16 0500  Weight: 98.1 kg (216 lb 4.3 oz) 102.6 kg (226 lb 3.1 oz) 104.3 kg (229 lb 15 oz)    Examination:  General exam: sleeping Respiratory system: Clear to auscultation. Respiratory effort normal. Cardiovascular system:RRR. No murmurs, rubs, gallops. Gastrointestinal system: Abdomen is nondistended, soft and nontender. No organomegaly or masses felt. Normal bowel sounds heard. Central nervous system: agitated. No focal neurological deficits. Extremities: No C/C/E, +pedal pulses Skin: No rashes, lesions or ulcers Psychiatry: sleeping    Data Reviewed: I have personally reviewed following labs and imaging studies  CBC:  Recent Labs Lab 12/21/16 1125 12/22/16 0502 12/23/16 0433 12/24/16 0526 12/25/16 0453 12/26/16 0520  WBC 4.9 7.9 6.0 3.4* 4.3 3.4*  NEUTROABS 3.4  --   --   --   --   --   HGB 13.2 12.5* 12.7* 11.0* 10.6* 10.4*  HCT 40.4 38.0* 38.5* 34.7* 32.3* 31.8*  MCV 97.1 96.0 96.3 99.4 97.9 97.0  PLT 63* 59* 42* 74* 78* 67*   Basic Metabolic Panel:  Recent Labs Lab 12/22/16 0502 12/23/16 0433 12/24/16 0526 12/25/16 0453 12/26/16 0520  NA 136 139 149* 138 139  K 3.9 3.2* 3.5 3.8 3.5  CL 104 108 117* 109 107  CO2 24 22 24 26 29   GLUCOSE 105* 160* 219* 106* 98  BUN 11 16 17 10 8   CREATININE 0.72 0.83 0.69 0.55* 0.55*  CALCIUM 8.6* 8.7* 8.7* 8.2* 7.9*   GFR: Estimated Creatinine Clearance: 119.3 mL/min (A) (by C-G formula based on SCr of 0.55 mg/dL (L)). Liver Function Tests:  Recent Labs Lab 12/21/16 1125 12/23/16 0433  AST 48* 41  ALT 22 17  ALKPHOS 103 83  BILITOT 2.6* 2.3*  PROT 6.8 6.5  ALBUMIN 3.4* 3.0*   No results for input(s): LIPASE, AMYLASE in the last 168 hours.  Recent Labs Lab 12/21/16 1125 12/22/16 0502 12/23/16 0433 12/25/16 0453 12/26/16 0520  AMMONIA 54* 56*  52* 94* 40*   Coagulation Profile:  Recent Labs Lab 12/21/16 1125  INR 1.33   Cardiac Enzymes: No results for input(s): CKTOTAL, CKMB, CKMBINDEX, TROPONINI in the last 168 hours. BNP (last 3 results) No results for input(s): PROBNP in the last 8760 hours. HbA1C: No results for input(s): HGBA1C in the last 72 hours. CBG:  Recent Labs Lab 12/22/16 1430  GLUCAP 106*   Lipid Profile: No results for input(s): CHOL, HDL, LDLCALC, TRIG, CHOLHDL, LDLDIRECT in the last 72 hours. Thyroid Function Tests: No results for input(s): TSH, T4TOTAL, FREET4, T3FREE, THYROIDAB in the last 72 hours. Anemia Panel: No results for input(s): VITAMINB12, FOLATE, FERRITIN, TIBC, IRON, RETICCTPCT in the last 72 hours. Sepsis Labs: No results for input(s): PROCALCITON, LATICACIDVEN in the last 168 hours.  Recent Results (from the past 240 hour(s))  MRSA PCR Screening     Status: None   Collection  Time: 12/21/16  4:25 PM  Result Value Ref Range Status   MRSA by PCR NEGATIVE NEGATIVE Final    Comment:        The GeneXpert MRSA Assay (FDA approved for NASAL specimens only), is one component of a comprehensive MRSA colonization surveillance program. It is not intended to diagnose MRSA infection nor to guide or monitor treatment for MRSA infections.   Culture, blood (routine x 2)     Status: None (Preliminary result)   Collection Time: 12/22/16  5:31 PM  Result Value Ref Range Status   Specimen Description BLOOD LEFT HAND  Final   Special Requests   Final    BOTTLES DRAWN AEROBIC ONLY Blood Culture adequate volume   Culture NO GROWTH 3 DAYS  Final   Report Status PENDING  Incomplete  Culture, blood (routine x 2)     Status: Abnormal   Collection Time: 12/22/16  5:40 PM  Result Value Ref Range Status   Specimen Description LEFT ANTECUBITAL  Final   Special Requests   Final    BOTTLES DRAWN AEROBIC AND ANAEROBIC Blood Culture adequate volume   Culture  Setup Time   Final    GRAM POSITIVE  COCCI Gram Stain Report Called to,Read Back By and Verified With: HOWARD C AT 1250 ON [id number] BY THOMPSON S. AEROBIC BOTTLE ONLY    Culture (A)  Final    VIRIDANS STREPTOCOCCUS THE SIGNIFICANCE OF ISOLATING THIS ORGANISM FROM A SINGLE SET OF BLOOD CULTURES WHEN MULTIPLE SETS ARE DRAWN IS UNCERTAIN. PLEASE NOTIFY THE MICROBIOLOGY DEPARTMENT WITHIN ONE WEEK IF SPECIATION AND SENSITIVITIES ARE REQUIRED. Performed at [facility], [place] [address], [place], [place] [address]    Report Status 12/25/2016 FINAL  Final  Blood Culture ID Panel (Reflexed)     Status: Abnormal   Collection Time: 12/22/16  5:40 PM  Result Value Ref Range Status   Enterococcus species NOT DETECTED NOT DETECTED Final   Listeria monocytogenes NOT DETECTED NOT DETECTED Final   Staphylococcus species NOT DETECTED NOT DETECTED Final   Staphylococcus aureus NOT DETECTED NOT DETECTED Final   Streptococcus species DETECTED (A) NOT DETECTED Final    Comment: Not Enterococcus species, Streptococcus agalactiae, Streptococcus pyogenes, or Streptococcus pneumoniae. CRITICAL RESULT CALLED TO, READ BACK BY AND VERIFIED WITH: Tana Conch RN 17:30 12/23/16 ([email or web address])    Streptococcus agalactiae NOT DETECTED NOT DETECTED Final   Streptococcus pneumoniae NOT DETECTED NOT DETECTED Final   Streptococcus pyogenes NOT DETECTED NOT DETECTED Final   Acinetobacter baumannii NOT DETECTED NOT DETECTED Final   Enterobacteriaceae species NOT DETECTED NOT DETECTED Final   Enterobacter cloacae complex NOT DETECTED NOT DETECTED Final   Escherichia coli NOT DETECTED NOT DETECTED Final   Klebsiella oxytoca NOT DETECTED NOT DETECTED Final   Klebsiella pneumoniae NOT DETECTED NOT DETECTED Final   Proteus species NOT DETECTED NOT DETECTED Final   Serratia marcescens NOT DETECTED NOT DETECTED Final   Haemophilus influenzae NOT DETECTED NOT DETECTED Final   Neisseria meningitidis NOT DETECTED NOT DETECTED Final   Pseudomonas aeruginosa NOT DETECTED NOT  DETECTED Final   Candida albicans NOT DETECTED NOT DETECTED Final   Candida glabrata NOT DETECTED NOT DETECTED Final   Candida krusei NOT DETECTED NOT DETECTED Final   Candida parapsilosis NOT DETECTED NOT DETECTED Final   Candida tropicalis NOT DETECTED NOT DETECTED Final    Comment: Performed at [facility], [place] [address], [place], [place] [address]  Culture, blood (routine x 2)  Status: None (Preliminary result)   Collection Time: 12/23/16  8:09 PM  Result Value Ref Range Status   Specimen Description BLOOD PICC LINE  Final   Special Requests   Final    BOTTLES DRAWN AEROBIC AND ANAEROBIC Blood Culture results may not be optimal due to an inadequate volume of blood received in culture bottles   Culture NO GROWTH 2 DAYS  Final   Report Status PENDING  Incomplete  Culture, blood (routine x 2)     Status: None (Preliminary result)   Collection Time: 12/23/16  8:10 PM  Result Value Ref Range Status   Specimen Description BLOOD LEFT ANTECUBITAL  Final   Special Requests   Final    BOTTLES DRAWN AEROBIC AND ANAEROBIC Blood Culture adequate volume   Culture NO GROWTH 2 DAYS  Final   Report Status PENDING  Incomplete  CSF culture     Status: None (Preliminary result)   Collection Time: 12/24/16  3:08 PM  Result Value Ref Range Status   Specimen Description CSF  Final   Special Requests NONE  Final   Gram Stain   Final    NO ORGANISMS SEEN WBC PRESENT, PREDOMINANTLY PMN Gram Stain Report Called to,Read Back By and Verified With: GAMMONS S. AT 8466 ON [id number] BY THOMPSON S.    Culture   Final    NO GROWTH 1 DAY Performed at [facility], [place] [address] [place], [place] [address]    Report Status PENDING  Incomplete         Radiology Studies: Dg Fluoro Guide Lumbar Puncture  Result Date: 12/24/2016 CLINICAL DATA:  Altered mental status EXAM: DIAGNOSTIC LUMBAR PUNCTURE UNDER FLUOROSCOPIC GUIDANCE FLUOROSCOPY TIME:  Fluoroscopy Time:  0 minutes 6 seconds Radiation  Exposure Index (if provided by the fluoroscopic device): 2.2 mGy Number of Acquired Spot Images: Single screen capture during fluoroscopy PROCEDURE: Written informed consent was obtained from the patient's spouse. Timeout protocol followed. Patient placed prone. L4-L5 disc space was localized under fluoroscopy. Skin prepped and draped in usual sterile fashion. Skin and soft tissues anesthetized with 2 ml of 1% lidocaine. 22 gauge needle was advanced into the spinal canal where CSF was encounter. Question CSF slightly xanthochromic. 9.5 mL of CSF was obtained in 4 tubes for requested analysis. Procedure tolerated very well by patient without immediate complication. IMPRESSION: Successful fluoroscopic guided lumbar puncture. Electronically Signed   By: Lavonia Dana M.D.   On: 12/24/2016 15:33        Scheduled Meds: . chlorhexidine  15 mL Mouth Rinse BID  . Chlorhexidine Gluconate Cloth  6 each Topical Daily  . ezetimibe  10 mg Oral Daily  . fluconazole  100 mg Oral Daily  . ipratropium-albuterol  3 mL Nebulization TID  . lactulose  40 g Oral TID  . mouth rinse  15 mL Mouth Rinse q12n4p  . metoprolol tartrate  50 mg Oral QHS  . metoprolol tartrate  25 mg Oral Daily  . multivitamin with minerals  1 tablet Oral Daily  . nystatin   Topical TID  . pantoprazole  40 mg Oral Daily  . potassium chloride SA  20 mEq Oral BID  . rifaximin  550 mg Oral BID  . sodium chloride flush  10-40 mL Intracatheter Q12H   Continuous Infusions: . sodium chloride 100 mL/hr at 12/26/16 0800  . cefTRIAXone (ROCEPHIN)  IV Stopped (12/25/16 1705)     LOS: 4 days    Time spent: 76mns    Tailynn Armetta,  MD [facility] Pager [phone]  If 7PM-7AM, please contact night-coverage [email or web address] Password [email or web address] 12/26/2016, 9:02 AM

## 2016-12-27 ENCOUNTER — Inpatient Hospital Stay (HOSPITAL_COMMUNITY): Payer: Medicare HMO

## 2016-12-27 LAB — CULTURE, BLOOD (ROUTINE X 2)
CULTURE: NO GROWTH
SPECIAL REQUESTS: ADEQUATE

## 2016-12-27 LAB — CSF CULTURE

## 2016-12-27 LAB — CSF CULTURE W GRAM STAIN
Culture: NO GROWTH
Gram Stain: NONE SEEN

## 2016-12-27 MED ORDER — LORAZEPAM 1 MG PO TABS
1.0000 mg | ORAL_TABLET | Freq: Three times a day (TID) | ORAL | Status: DC
  Administered 2016-12-27 – 2016-12-30 (×9): 1 mg via ORAL
  Filled 2016-12-27 (×9): qty 1

## 2016-12-27 NOTE — Progress Notes (Signed)
[place] A. [name] Laughter, MD     [email or web address]          [name] [name] is an 56 y.o. male.   ASSESSMENT/PLAN: 1. Acute encephalopathy: The most likely etiology is from hepatic disease. Medication effect is also likely a contributing factor. Spinal fluid does not indicate CNS infection.   2. Small intracranial hemorrhage which seems to be improving slightly. Unlikely to cause encephalopathy given the small size.   3. Marked thrombocytopenia:     4. Gait disorder due to #1.  5. Positive Blood culture   6. CSF findings due to intracranial hemorrhage.  7. Marked dysarthria out of proportion to encephalopathy and rest of the evaluation. A MRI will be obtained.  Overall the patient remains about the same. He is still confused and moderately encephalopathic.    GENERAL: He is calm and cooperates with evaluation.  HEENT: Neck is supple.  ABDOMEN: soft  EXTREMITIES: No edema   BACK: Normal  SKIN: Normal by inspection.    MENTAL STATUS: He is awake and alert. He does follow midline commands and to some degree appendicular commands. Speech is markedly dysarthric.   CRANIAL NERVES: Pupils R non reactive and cloudy, L round and reactive to light; extra ocular movements are full, there is no significant nystagmus; visual fields are full; upper and lower facial muscles are normal in strength and symmetric, there is no flattening of the nasolabial folds; tongue is midline; uvula is midline.  MOTOR: Bulk and tone are normal throughout. He has antigravity strength in both upper and lower extremities bilaterally.  COORDINATION: No asterixis; no tremors, no dysmetria or parkinsonism.  SENSATION: He does respond to painful some light bilaterally although this is reduced.       Blood pressure (!) 168/78, pulse 75, temperature 99.4 F (37.4 C), temperature source Oral, resp. rate 18, height '5\' 8"'  (1.727 m), weight 229 lb 15 oz (104.3 kg), SpO2 96 %.  Past  Medical History:  Diagnosis Date  . Anemia   . Anxiety   . Arthritis   . Bipolar 1 disorder ([name] [name])   . Blind right eye   . Blood clots in brain    per patient  . Cardiomegaly   . Cataract    Right eye  . Cerebrovascular disease    Right vertebral artery  . Cholelithiasis    Asymptomatic  . Chronic back pain   . Cirrhosis ([name] [name])    suspected NASH  . Cirrhosis ([name] [name])    diagnosed 07/2010 when presented with first episode of hepatic encephalopathy, afp on 416/13=4.8,  U/S on 12/14/11  liver stable, pt has received 2 hep A/B vaccines  . COPD (chronic obstructive pulmonary disease) ([place])   . Depression   . Diabetes mellitus   . Diverticulosis   . GERD (gastroesophageal reflux disease)   . Hard of hearing   . Hepatic encephalopathy ([name] [name])   . Hyperlipidemia   . Hypertension   . Mental retardation   . Seizures ([name] [name])   . Thrombocytopenia ([name] [name])     Past Surgical History:  Procedure Laterality Date  . CAST APPLICATION  08/28/3844   Procedure: MINOR CAST APPLICATION;  Surgeon: [name] Abbott, MD;  Location: [facility];  Service: Orthopedics;  Laterality: Right;  no anesthesia , procedure room do not need or room !  . COLONOSCOPY  12/15/11   colonic divericulosis;poor prep, ACBE  recommended but has not been done  . ear operations    . ESOPHAGOGASTRODUODENOSCOPY  02/02/2011   Rourk-Normal  esophagus.  No varices/Question mild portal gastropathy, extrinsic compression on the antrum, lesser curvature of uncertain significance, some minimally  nodular mucosa status post biopsy, patent pylorus, normal duodenum 1 and duodenum 2.  . ESOPHAGOGASTRODUODENOSCOPY  12/15/11   Rourk-->mild changes of portal gastropathy, gastric/duodenal erosions s/p bx  (reactive changes with mild chronic inflammation. No H.pylori  . FOOT SURGERY    . ORIF ANKLE FRACTURE  08/27/2011   Procedure: OPEN REDUCTION INTERNAL FIXATION (ORIF) ANKLE FRACTURE;  Surgeon: [name] Abbott, MD;  Location: [facility];  Service: Orthopedics;   Laterality: Right;  . TONSILLECTOMY      Family History  Problem Relation Age of Onset  . Heart failure Mother   . Thyroid disease Mother   . Cancer Father   . Asthma Brother   . Heart attack Brother   . Cirrhosis Brother     EtOH  . Colon cancer Maternal Aunt     Social History:  reports that he has been smoking Cigarettes.  He has a 30.00 pack-year smoking history. He has never used smokeless tobacco. He reports that he does not drink alcohol or use drugs.  Allergies:  Allergies  Allergen Reactions  . Penicillins Rash    Has patient had a PCN reaction causing immediate rash, facial/tongue/throat swelling, SOB or lightheadedness with hypotension: Yes Has patient had a PCN reaction causing severe rash involving mucus membranes or skin necrosis: No Has patient had a PCN reaction that required hospitalization No Has patient had a PCN reaction occurring within the last 10 years: No If all of the above answers are "NO", then may proceed with Cephalosporin use.     Medications: Prior to Admission medications   Medication Sig Start Date End Date Taking? Authorizing Provider  ALPRAZolam Duanne Moron) 0.5 MG tablet Take 0.5 mg by mouth 3 (three) times daily as needed for anxiety.   Yes Historical Provider, MD  aspirin EC 81 MG tablet Take 81 mg by mouth daily.   Yes Historical Provider, MD  ezetimibe (ZETIA) 10 MG tablet Take 10 mg by mouth daily.   Yes Historical Provider, MD  gabapentin (NEURONTIN) 300 MG capsule Take 300 mg by mouth 3 (three) times daily.   Yes Historical Provider, MD  lactulose (CHRONULAC) 10 GM/15ML solution Take 60 mLs (40 g total) by mouth 3 (three) times daily. Titrate for 3-4 bowel motions daily. 12/15/16  Yes [name] Patricia, MD  metoprolol tartrate (LOPRESSOR) 25 MG tablet Take 25 mg by mouth 3 (three) times daily. Take 25 mg in the morning and 50 mg at night   Yes Historical Provider, MD  Multiple Vitamin (MULTIVITAMIN WITH MINERALS) TABS Take 1 tablet by  mouth daily.   Yes Historical Provider, MD  omeprazole (PRILOSEC) 20 MG capsule Take 20 mg by mouth daily.   Yes Historical Provider, MD  potassium chloride SA (K-DUR,KLOR-CON) 20 MEQ tablet Take 20 mEq by mouth 2 (two) times daily.   Yes Historical Provider, MD    Scheduled Meds: . chlorhexidine  15 mL Mouth Rinse BID  . Chlorhexidine Gluconate Cloth  6 each Topical Daily  . ezetimibe  10 mg Oral Daily  . fluconazole  100 mg Oral Daily  . ipratropium-albuterol  3 mL Nebulization BID  . lactulose  40 g Oral TID  . mouth rinse  15 mL Mouth Rinse q12n4p  . metoprolol tartrate  50 mg Oral QHS  . metoprolol tartrate  25 mg Oral Daily  . multivitamin with minerals  1 tablet Oral Daily  .  nystatin   Topical TID  . pantoprazole  40 mg Oral Daily  . potassium chloride SA  20 mEq Oral BID  . rifaximin  550 mg Oral BID  . risperiDONE  0.5 mg Oral BID  . sodium chloride flush  10-40 mL Intracatheter Q12H   Continuous Infusions: . sodium chloride 100 mL/hr at 12/26/16 1321  . cefTRIAXone (ROCEPHIN)  IV Stopped (12/26/16 1304)   PRN Meds:.acetaminophen, acetaminophen, albuterol, ALPRAZolam, hydrALAZINE, ketorolac, LORazepam, sodium chloride flush     Results for orders placed or performed during the hospital encounter of 12/21/16 (from the past 48 hour(s))  Basic metabolic panel     Status: Abnormal   Collection Time: 12/26/16  5:20 AM  Result Value Ref Range   Sodium 139 135 - 145 mmol/L   Potassium 3.5 3.5 - 5.1 mmol/L   Chloride 107 101 - 111 mmol/L   CO2 29 22 - 32 mmol/L   Glucose, Bld 98 65 - 99 mg/dL   BUN 8 6 - 20 mg/dL   Creatinine, Ser 0.55 (L) 0.61 - 1.24 mg/dL   Calcium 7.9 (L) 8.9 - 10.3 mg/dL   GFR calc non Af Amer >60 >60 mL/min   GFR calc Af Amer >60 >60 mL/min    Comment: (NOTE) The eGFR has been calculated using the CKD EPI equation. This calculation has not been validated in all clinical situations. eGFR's persistently <60 mL/min signify possible Chronic  Kidney Disease.    Anion gap 3 (L) 5 - 15  CBC     Status: Abnormal   Collection Time: 12/26/16  5:20 AM  Result Value Ref Range   WBC 3.4 (L) 4.0 - 10.5 K/uL   RBC 3.28 (L) 4.22 - 5.81 MIL/uL   Hemoglobin 10.4 (L) 13.0 - 17.0 g/dL   HCT 31.8 (L) 39.0 - 52.0 %   MCV 97.0 78.0 - 100.0 fL   MCH 31.7 26.0 - 34.0 pg   MCHC 32.7 30.0 - 36.0 g/dL   RDW 16.0 (H) 11.5 - 15.5 %   Platelets 67 (L) 150 - 400 K/uL    Comment: SPECIMEN CHECKED FOR CLOTS  Ammonia     Status: Abnormal   Collection Time: 12/26/16  5:20 AM  Result Value Ref Range   Ammonia 40 (H) 9 - 35 umol/L    Studies/Results:  [name] CT FINDINGS: Brain: Trace hyperdensity along the left aspect of the interhemispheric fissure and medial left frontal lobe on series 2, image 24 is stable and is most compatible with trace subarachnoid hemorrhage. A trace amount of parafalcine subarachnoid is also visible on the opposite side (series 2, images 22 and 23). No associated mass effect or cerebral edema.  No other acute intracranial hemorrhage identified. No intraventricular blood.  No midline shift, ventriculomegaly, mass effect, evidence of mass lesion, or cortically based acute infarction. Gray-white matter differentiation is within normal limits throughout the brain.  Vascular: Calcified atherosclerosis at the skull base.  Skull: Stable and intact.  No acute osseous abnormality identified.  Sinuses/Orbits: Previous mastoidectomies appears stable. Stable paranasal sinus pneumatization, anterior left ethmoid mucosal thickening and opacification.  Other: Right phthisis bulbi. Left orbits soft tissues are stable and intact. Mild broad-based posterior convexity scalp hematoma has regressed (series 3, image 30). No other scalp hematoma or acute superficial soft tissue injury identified.  IMPRESSION: 1. Small parafalcine density along the medial frontal lobes is stable an most compatible with a trace amount of  posttraumatic Subarachnoid Hemorrhage. 2. No associated mass effect and  no other acute intracranial abnormality. 3. Regressed mild posterior convexity scalp hematoma. No skull fracture identified.      EEG 12/15/16 This recording is abnormal due to the following reasons: 1. Moderately severe global slowing indicating a moderate to severe global encephalopathy. 2. Multiple triphasic waves which are typically seen in toxic metabolic encephalopathies particularly from hepatic failure or renal failure.      [name] CT scan is reviewed in person. His compared to the previous scan. The increased brightness seen in the interhemispheric fissure is a about the same or slightly reduced on  today's scan. It is seen on 4 cuts and does not have mass effect.  Torrie Lafavor A. [name] [name], M.D.  Diplomate, Tax adviser of Psychiatry and Neurology ( Neurology). 12/27/2016, 8:11 AM

## 2016-12-27 NOTE — Progress Notes (Signed)
Pt would not take his pm lactulose, educated pt on importance of compliance, pt refused.

## 2016-12-27 NOTE — Progress Notes (Signed)
Physical Therapy Treatment Patient Details Name: [name] [name] MRN: [id number] DOB: 24-Jan-1961 Today's Date: 12/27/2016    History of Present Illness 56 y.o. male with h/o NASH cirrhosis and repeated episodes of hepatic encephalopathy due to medication noncompliance, DM, seizure disorder, not taking his meds and bipolar disorder, comes in today with increased confusion. Patient is quite somnolent at time of my evaluation and no family members are present. Per report given to EDP,  Family member stated that he was found on the floor shaking and improved after 20 minutes but never regained complete consciousness. Brought to the ED for evaluation. VSS, labs significant for chronic pancytopenia, CT head did show small subarachnoid hemorrhage, but this was unchanged on follow up CT, UA and CXR negative for evidence of infectious sources.  Dx: toxic metabolic encephalopathy      PT Comments    Pt received in bed, with legs hanging out of the bed, but was agreeable to PT tx.   Pt remains confused and mumbling at times.  Pt requires Mod/Max A for supine<>sit, and requires varying level of assistance for static sitting balance.  He demonstrates lean to the left and posteriorly, but able to correct once he is able to hold to the end of the bed on the right.  He required Min A +2 for sit<>Stand with RW, and he then ambulated 7f with RW and Mod A.  Continue to recommend SNF.     Follow Up Recommendations  SNF     Equipment Recommendations  None recommended by PT    Recommendations for Other Services       Precautions / Restrictions Precautions Precautions: Fall (B mitts) Precaution Comments: 2 falls in the past 6 months.  Plus an additional fall that brought him into the hospital this time.  Restrictions Weight Bearing Restrictions: No    Mobility  Bed Mobility Overal bed mobility: Needs Assistance Bed Mobility: Supine to Sit     Supine to sit: Mod assist;HOB elevated;Max assist      General bed mobility comments: Pt initiates transfer, but is unable to complete without assistance.  Once sitting on the EOB, he demonstrates strong push to the left, and posterior LOB.  Correct with pt  holding onto the bed rail with R hand.    Transfers Overall transfer level: Needs assistance Equipment used: Rolling walker (2 wheeled) Transfers: Sit to/from Stand Sit to Stand: Min assist;+2 safety/equipment         General transfer comment: Pt was able to take a few lateral steps [facility]    Ambulation/Gait Ambulation/Gait assistance: Mod assist Ambulation Distance (Feet): 20 Feet Assistive device: Rolling walker (2 wheeled) Gait Pattern/deviations: Step-to pattern;Trunk flexed   Gait velocity interpretation: <1.8 ft/sec, indicative of risk for recurrent falls General Gait Details: Pt has extremely pronated feet  with (+) out toeing position and difficulty keeping feet inside the base of the RW.  He requires assistance for forward progression and navigation of the RW.     Stairs            Wheelchair Mobility    Modified Rankin (Stroke Patients Only)       Balance Overall balance assessment: History of Falls;Needs assistance Sitting-balance support: Bilateral upper extremity supported;Feet supported Sitting balance-Leahy Scale: Poor     Standing balance support: Bilateral upper extremity supported Standing balance-Leahy Scale: Poor  Cognition Arousal/Alertness: Awake/alert Behavior During Therapy: Impulsive;Flat affect;Restless Overall Cognitive Status: Impaired/Different from baseline                                 General Comments: Pt continues to be confused, and mumbling at times.      Exercises      General Comments        Pertinent Vitals/Pain Pain Assessment: No/denies pain    Home Living                      Prior Function            PT Goals (current goals can  now be found in the care plan section) Acute Rehab PT Goals Patient Stated Goal: To go home.  PT Goal Formulation: All assessment and education complete, DC therapy Time For Goal Achievement: 01/06/17 Potential to Achieve Goals: Fair Progress towards PT goals: Progressing toward goals    Frequency           PT Plan Current plan remains appropriate    Co-evaluation              AM-PAC PT "6 Clicks" Daily Activity  Outcome Measure  Difficulty turning over in bed (including adjusting bedclothes, sheets and blankets)?: A Little Difficulty moving from lying on back to sitting on the side of the bed? : A Lot Difficulty sitting down on and standing up from a chair with arms (e.g., wheelchair, bedside commode, etc,.)?: A Little Help needed moving to and from a bed to chair (including a wheelchair)?: A Lot Help needed walking in hospital room?: A Lot Help needed climbing 3-5 steps with a railing? : A Lot 6 Click Score: 14    End of Session Equipment Utilized During Treatment: Gait belt;Oxygen Activity Tolerance: Patient limited by fatigue Patient left: in bed;with call bell/phone within reach;with bed alarm set Nurse Communication: Mobility status (RN present during mobiltiy, nurse tech assisted with tx.  ) PT Visit Diagnosis: Other abnormalities of gait and mobility (R26.89);Muscle weakness (generalized) (M62.81);History of falling (Z91.81);Other symptoms and signs involving the nervous system (R29.898)     Time: 7106-2694 PT Time Calculation (min) (ACUTE ONLY): 24 min  Charges:  $Gait Training: 8-22 mins $Therapeutic Activity: 8-22 mins                    G Codes:       Beth Christoffer Currier, PT, DPT X: [phone]

## 2016-12-27 NOTE — Progress Notes (Signed)
PROGRESS NOTE    [name] [name]  KPQ:[id number] DOB: 1960-10-26 DOA: 12/21/2016 PCP: [name] Messick, MD    Brief Narrative:  56 year old male with history of [name] [name] cirrhosis and repeated admissions for hepatic encephalopathy related to medication noncompliance, was recently discharged on 4/25 after being treated for hepatic encephalopathy. He was reportedly doing well after discharge, the following day became increasingly confused again. He began having multiple falls. He was brought back to the ER on 5/1 and admitted for recurrent encephalopathy. CT scan of the head showed scalp hematoma as well as parenchymal contusion. Ammonia was mildly elevated. He did not have any obvious signs of infection. He was admitted for further evaluation.   Assessment & Plan:   Active Problems:   COPD (chronic obstructive pulmonary disease) (HCC)   HTN (hypertension)   Cirrhosis (HCC)   Thrombocytopenia (HCC)   Hyperammonemia (HCC)   Altered mental state   Anxiety   Fall   Cerebral contusion (HCC)   Encephalopathy   1. Acute encephalopathy. Unclear etiology. Ammonia level has been mildly elevated, but it is unclear if this degree of elevation has clinical significance. He was taking benzodiazepines prior to admission, but this dose has been decreased since admission and effectively, patient has not been receiving any benzos since admission. CT head did show small subarachnoid hemorrhage, but this was unchanged on follow up CT. ABG did not show elevated PCO2. He was having fevers without any clear source of infection. Urinalysis, chest xray and abdominal exam are benign. Blood culture positive for strep species. He was started on intravenous antibiotics and appears to be improving. Neurology following. Lumbar puncture did not show any signs of CNS infection. Currently, he is having episodes of agitation, but I suspect to some degree this may be his baseline. Will try and schedule ativan. Will recheck labs and  ammonia in AM. Discontinue sitter once patient is calm.  2. Cerebral contusion. ED physician had discussed case with neurosurgery on call at the time of admission and no surgery was recommended at that time. It was felt patient could be monitored at [facility]. CT head was repeated on 5/2 with no significant changes. No further work up at this time.  3. Hyperammonemia. Patient has had recurrent admissions for hepatic encephalopathy due to noncompliance with lactulose. Ammonia level has improved. He is receiving lactulose and is having bowel movements. He is also on xifaxan. Continue current treatments.  4. Nash cirrhosis. Follow-up with GI as an outpatient  5. Thrombocytopenia. Related to liver disease/infection. No signs of bleeding. Continue to monitor  6. Hypertension. Currently on metoprolol. Blood pressure has been stable.  7. Multiple falls. Related to generalized weakness and encephalopathy. Physical therapy has seen the patient and recommends SNF.  8. Fever/Bacteremia. Patient had one out of two sets positive blood culture for strep viridans. Source of bacteremia is not entirely clear. He is on intravenous antibiotics. Discussed with Dr. Megan Salon on call for ID. Recommended transitioning patient to oral antibiotics on discharge.   DVT prophylaxis: scd Code Status: DNR Family Communication: no family present Disposition Plan: SNF placement on discharge   Consultants:   neurology  Procedures:   Lumbar puncture 5/4  Antimicrobials:   Vancomycin 5/2>>5/5  Meropenem 5/2>>5/5  Ceftriaxone 5/5>>   Subjective: Patient continues to have safety sitter in order to prevent him from getting out of bed  Objective: Vitals:   12/26/16 1950 12/26/16 2040 12/27/16 0552 12/27/16 0721  BP:  (!) 168/76 (!) 168/78   Pulse:  82 75   Resp:  18 18   Temp:  98.7 F (37.1 C) 99.4 F (37.4 C)   TempSrc:  Oral Oral   SpO2: 96% 95% 100% 96%  Weight:      Height:         Intake/Output Summary (Last 24 hours) at 12/27/16 1334 Last data filed at 12/27/16 1331  Gross per 24 hour  Intake             1590 ml  Output             5551 ml  Net            -3961 ml   Filed Weights   12/24/16 0500 12/25/16 0330 12/26/16 0500  Weight: 98.1 kg (216 lb 4.3 oz) 102.6 kg (226 lb 3.1 oz) 104.3 kg (229 lb 15 oz)    Examination:  General exam: Alert, awake, able to carry on a conversation Respiratory system: Clear to auscultation. Respiratory effort normal. Cardiovascular system:RRR. No murmurs, rubs, gallops. Gastrointestinal system: Abdomen is nondistended, soft and nontender. No organomegaly or masses felt. Normal bowel sounds heard. Central nervous system: .No focal neurological deficits. Extremities: No C/C/E, +pedal pulses Skin: No rashes, lesions or ulcers Psychiatry: confused at times, trying to climb out of bed    Data Reviewed: I have personally reviewed following labs and imaging studies  CBC:  Recent Labs Lab 12/21/16 1125 12/22/16 0502 12/23/16 0433 12/24/16 0526 12/25/16 0453 12/26/16 0520  WBC 4.9 7.9 6.0 3.4* 4.3 3.4*  NEUTROABS 3.4  --   --   --   --   --   HGB 13.2 12.5* 12.7* 11.0* 10.6* 10.4*  HCT 40.4 38.0* 38.5* 34.7* 32.3* 31.8*  MCV 97.1 96.0 96.3 99.4 97.9 97.0  PLT 63* 59* 42* 74* 78* 67*   Basic Metabolic Panel:  Recent Labs Lab 12/22/16 0502 12/23/16 0433 12/24/16 0526 12/25/16 0453 12/26/16 0520  NA 136 139 149* 138 139  K 3.9 3.2* 3.5 3.8 3.5  CL 104 108 117* 109 107  CO2 24 22 24 26 29   GLUCOSE 105* 160* 219* 106* 98  BUN 11 16 17 10 8   CREATININE 0.72 0.83 0.69 0.55* 0.55*  CALCIUM 8.6* 8.7* 8.7* 8.2* 7.9*   GFR: Estimated Creatinine Clearance: 119.3 mL/min (A) (by C-G formula based on SCr of 0.55 mg/dL (L)). Liver Function Tests:  Recent Labs Lab 12/21/16 1125 12/23/16 0433  AST 48* 41  ALT 22 17  ALKPHOS 103 83  BILITOT 2.6* 2.3*  PROT 6.8 6.5  ALBUMIN 3.4* 3.0*   No results for  input(s): LIPASE, AMYLASE in the last 168 hours.  Recent Labs Lab 12/21/16 1125 12/22/16 0502 12/23/16 0433 12/25/16 0453 12/26/16 0520  AMMONIA 54* 56* 52* 94* 40*   Coagulation Profile:  Recent Labs Lab 12/21/16 1125  INR 1.33   Cardiac Enzymes: No results for input(s): CKTOTAL, CKMB, CKMBINDEX, TROPONINI in the last 168 hours. BNP (last 3 results) No results for input(s): PROBNP in the last 8760 hours. HbA1C: No results for input(s): HGBA1C in the last 72 hours. CBG:  Recent Labs Lab 12/22/16 1430  GLUCAP 106*   Lipid Profile: No results for input(s): CHOL, HDL, LDLCALC, TRIG, CHOLHDL, LDLDIRECT in the last 72 hours. Thyroid Function Tests: No results for input(s): TSH, T4TOTAL, FREET4, T3FREE, THYROIDAB in the last 72 hours. Anemia Panel: No results for input(s): VITAMINB12, FOLATE, FERRITIN, TIBC, IRON, RETICCTPCT in the last 72 hours. Sepsis Labs: No results for input(s): PROCALCITON,  LATICACIDVEN in the last 168 hours.  Recent Results (from the past 240 hour(s))  MRSA PCR Screening     Status: None   Collection Time: 12/21/16  4:25 PM  Result Value Ref Range Status   MRSA by PCR NEGATIVE NEGATIVE Final    Comment:        The GeneXpert MRSA Assay (FDA approved for NASAL specimens only), is one component of a comprehensive MRSA colonization surveillance program. It is not intended to diagnose MRSA infection nor to guide or monitor treatment for MRSA infections.   Culture, blood (routine x 2)     Status: None   Collection Time: 12/22/16  5:31 PM  Result Value Ref Range Status   Specimen Description BLOOD LEFT HAND  Final   Special Requests   Final    BOTTLES DRAWN AEROBIC ONLY Blood Culture adequate volume   Culture NO GROWTH 5 DAYS  Final   Report Status 12/27/2016 FINAL  Final  Culture, blood (routine x 2)     Status: Abnormal   Collection Time: 12/22/16  5:40 PM  Result Value Ref Range Status   Specimen Description LEFT ANTECUBITAL  Final    Special Requests   Final    BOTTLES DRAWN AEROBIC AND ANAEROBIC Blood Culture adequate volume   Culture  Setup Time   Final    GRAM POSITIVE COCCI Gram Stain Report Called to,Read Back By and Verified With: HOWARD C AT 1250 ON [id number] BY THOMPSON S. AEROBIC BOTTLE ONLY    Culture (A)  Final    VIRIDANS STREPTOCOCCUS THE SIGNIFICANCE OF ISOLATING THIS ORGANISM FROM A SINGLE SET OF BLOOD CULTURES WHEN MULTIPLE SETS ARE DRAWN IS UNCERTAIN. PLEASE NOTIFY THE MICROBIOLOGY DEPARTMENT WITHIN ONE WEEK IF SPECIATION AND SENSITIVITIES ARE REQUIRED. Performed at [facility], [place] [address] [place], [place] [address]    Report Status 12/25/2016 FINAL  Final  Blood Culture ID Panel (Reflexed)     Status: Abnormal   Collection Time: 12/22/16  5:40 PM  Result Value Ref Range Status   Enterococcus species NOT DETECTED NOT DETECTED Final   Listeria monocytogenes NOT DETECTED NOT DETECTED Final   Staphylococcus species NOT DETECTED NOT DETECTED Final   Staphylococcus aureus NOT DETECTED NOT DETECTED Final   Streptococcus species DETECTED (A) NOT DETECTED Final    Comment: Not Enterococcus species, Streptococcus agalactiae, Streptococcus pyogenes, or Streptococcus pneumoniae. CRITICAL RESULT CALLED TO, READ BACK BY AND VERIFIED WITH: Tana Conch RN 17:30 12/23/16 ([email or web address])    Streptococcus agalactiae NOT DETECTED NOT DETECTED Final   Streptococcus pneumoniae NOT DETECTED NOT DETECTED Final   Streptococcus pyogenes NOT DETECTED NOT DETECTED Final   Acinetobacter baumannii NOT DETECTED NOT DETECTED Final   Enterobacteriaceae species NOT DETECTED NOT DETECTED Final   Enterobacter cloacae complex NOT DETECTED NOT DETECTED Final   Escherichia coli NOT DETECTED NOT DETECTED Final   Klebsiella oxytoca NOT DETECTED NOT DETECTED Final   Klebsiella pneumoniae NOT DETECTED NOT DETECTED Final   Proteus species NOT DETECTED NOT DETECTED Final   Serratia marcescens NOT DETECTED NOT DETECTED Final   Haemophilus  influenzae NOT DETECTED NOT DETECTED Final   Neisseria meningitidis NOT DETECTED NOT DETECTED Final   Pseudomonas aeruginosa NOT DETECTED NOT DETECTED Final   Candida albicans NOT DETECTED NOT DETECTED Final   Candida glabrata NOT DETECTED NOT DETECTED Final   Candida krusei NOT DETECTED NOT DETECTED Final   Candida parapsilosis NOT DETECTED NOT DETECTED Final   Candida tropicalis NOT DETECTED NOT DETECTED Final  Comment: Performed at [facility], [place] [address] [place], [place] [address]  Culture, blood (routine x 2)     Status: None (Preliminary result)   Collection Time: 12/23/16  8:09 PM  Result Value Ref Range Status   Specimen Description BLOOD PICC LINE  Final   Special Requests   Final    BOTTLES DRAWN AEROBIC AND ANAEROBIC Blood Culture results may not be optimal due to an inadequate volume of blood received in culture bottles   Culture NO GROWTH 4 DAYS  Final   Report Status PENDING  Incomplete  Culture, blood (routine x 2)     Status: None (Preliminary result)   Collection Time: 12/23/16  8:10 PM  Result Value Ref Range Status   Specimen Description BLOOD LEFT ANTECUBITAL  Final   Special Requests   Final    BOTTLES DRAWN AEROBIC AND ANAEROBIC Blood Culture adequate volume   Culture NO GROWTH 4 DAYS  Final   Report Status PENDING  Incomplete  CSF culture     Status: None (Preliminary result)   Collection Time: 12/24/16  3:08 PM  Result Value Ref Range Status   Specimen Description CSF  Final   Special Requests NONE  Final   Gram Stain   Final    NO ORGANISMS SEEN WBC PRESENT, PREDOMINANTLY PMN Gram Stain Report Called to,Read Back By and Verified With: GAMMONS S. AT 3491 ON [id number] BY THOMPSON S.    Culture   Final    NO GROWTH 3 DAYS Performed at [facility], [place] [address], [place], [place] [address]    Report Status PENDING  Incomplete         Radiology Studies: No results found.      Scheduled Meds: . chlorhexidine  15 mL Mouth  Rinse BID  . Chlorhexidine Gluconate Cloth  6 each Topical Daily  . ezetimibe  10 mg Oral Daily  . fluconazole  100 mg Oral Daily  . ipratropium-albuterol  3 mL Nebulization BID  . lactulose  40 g Oral TID  . LORazepam  1 mg Oral TID  . mouth rinse  15 mL Mouth Rinse q12n4p  . metoprolol tartrate  50 mg Oral QHS  . metoprolol tartrate  25 mg Oral Daily  . multivitamin with minerals  1 tablet Oral Daily  . nystatin   Topical TID  . pantoprazole  40 mg Oral Daily  . potassium chloride SA  20 mEq Oral BID  . rifaximin  550 mg Oral BID  . risperiDONE  0.5 mg Oral BID  . sodium chloride flush  10-40 mL Intracatheter Q12H   Continuous Infusions: . sodium chloride 100 mL/hr at 12/27/16 0940  . cefTRIAXone (ROCEPHIN)  IV Stopped (12/26/16 1304)     LOS: 5 days    Time spent: 63mns    Tej Murdaugh, MD [facility] Pager [phone] If 7PM-7AM, please contact night-coverage [email or web address] Password [email or web address] 12/27/2016, 1:34 PM

## 2016-12-27 NOTE — Progress Notes (Addendum)
LCSW following for disposition:  New SNF placement.  [facility] has been submitted and received:  [id number]   (May 6,2018- January 25, 2017) Bed offers have also been received and anticipating [facility] per discussion with wife on Friday but will verify.   Insurance for [employer] has been submitted today 9:48 AM and awaiting review.  Once insurance Josem Kaufmann has been processed, patient will be set for placement options.  12:35 PM Insurance requested additional PT notes, sent over today's notes of care.  Still pending approval. 3:04 PM Insurance has called back with authorization. Insurance is wanting bed choice and LCSW has called numerous times to reach wife on contact numbers, however unsuccessful as it appears something is wrong with connection and phone line. Message left on a cell phone.  Will continue to try and reach wife to understand bed choice. Insurance can be reached at:  [phone]  [phone]. Once wife returns call and gives bed choice then insurance will give authorization.  Will anticipate DC for possibly Tuesday:  Patient continues to have a sitter will need sitter DC for 24 hours prior to transfer to SNF along with mitts! Will continue to follow and assist with acute care and transition.  Lane Hacker, MSW Clinical Social Work: [employer] Coverage for :  [phone]

## 2016-12-28 LAB — BASIC METABOLIC PANEL
Anion gap: 5 (ref 5–15)
CHLORIDE: 104 mmol/L (ref 101–111)
CO2: 30 mmol/L (ref 22–32)
Calcium: 8.3 mg/dL — ABNORMAL LOW (ref 8.9–10.3)
Creatinine, Ser: 0.51 mg/dL — ABNORMAL LOW (ref 0.61–1.24)
GFR calc Af Amer: >60 mL/min (ref >60)
GFR calc non Af Amer: >60 mL/min (ref >60)
GLUCOSE: 97 mg/dL (ref 65–99)
POTASSIUM: 3.7 mmol/L (ref 3.5–5.1)
Sodium: 139 mmol/L (ref 135–145)

## 2016-12-28 LAB — CULTURE, BLOOD (ROUTINE X 2)
CULTURE: NO GROWTH
Culture: NO GROWTH
Special Requests: ADEQUATE

## 2016-12-28 LAB — AMMONIA: Ammonia: 22 umol/L (ref 9–35)

## 2016-12-28 LAB — CBC
HEMATOCRIT: 31.6 % — AB (ref 39.0–52.0)
Hemoglobin: 10.3 g/dL — ABNORMAL LOW (ref 13.0–17.0)
MCH: 31.4 pg (ref 26.0–34.0)
MCHC: 32.6 g/dL (ref 30.0–36.0)
MCV: 96.3 fL (ref 78.0–100.0)
Platelets: 63 10*3/uL — ABNORMAL LOW (ref 150–400)
RBC: 3.28 MIL/uL — AB (ref 4.22–5.81)
RDW: 15.9 % — ABNORMAL HIGH (ref 11.5–15.5)
WBC: 3.6 10*3/uL — ABNORMAL LOW (ref 4.0–10.5)

## 2016-12-28 MED ORDER — CEPHALEXIN 500 MG PO CAPS
500.0000 mg | ORAL_CAPSULE | Freq: Three times a day (TID) | ORAL | Status: AC
  Administered 2016-12-28 – 2016-12-31 (×9): 500 mg via ORAL
  Filled 2016-12-28 (×9): qty 1

## 2016-12-28 NOTE — Care Management Note (Signed)
Case Management Note  Patient Details  Name: [name] [name] MRN: [id number] Date of Birth: [birth year]  If discussed at [place] Length of Stay Meetings, dates discussed:  12/28/2016   Sherald Barge, RN 12/28/2016, 10:19 AM

## 2016-12-28 NOTE — Progress Notes (Signed)
PHARMACY - PHYSICIAN COMMUNICATION CRITICAL VALUE ALERT - BLOOD CULTURE IDENTIFICATION (BCID)  Results for orders placed or performed during the hospital encounter of 12/21/16  Blood Culture ID Panel (Reflexed) (Collected: 12/22/2016  5:40 PM)  Result Value Ref Range   Enterococcus species NOT DETECTED NOT DETECTED   Listeria monocytogenes NOT DETECTED NOT DETECTED   Staphylococcus species NOT DETECTED NOT DETECTED   Staphylococcus aureus NOT DETECTED NOT DETECTED   Streptococcus species DETECTED (A) NOT DETECTED   Streptococcus agalactiae NOT DETECTED NOT DETECTED   Streptococcus pneumoniae NOT DETECTED NOT DETECTED   Streptococcus pyogenes NOT DETECTED NOT DETECTED   Acinetobacter baumannii NOT DETECTED NOT DETECTED   Enterobacteriaceae species NOT DETECTED NOT DETECTED   Enterobacter cloacae complex NOT DETECTED NOT DETECTED   Escherichia coli NOT DETECTED NOT DETECTED   Klebsiella oxytoca NOT DETECTED NOT DETECTED   Klebsiella pneumoniae NOT DETECTED NOT DETECTED   Proteus species NOT DETECTED NOT DETECTED   Serratia marcescens NOT DETECTED NOT DETECTED   Haemophilus influenzae NOT DETECTED NOT DETECTED   Neisseria meningitidis NOT DETECTED NOT DETECTED   Pseudomonas aeruginosa NOT DETECTED NOT DETECTED   Candida albicans NOT DETECTED NOT DETECTED   Candida glabrata NOT DETECTED NOT DETECTED   Candida krusei NOT DETECTED NOT DETECTED   Candida parapsilosis NOT DETECTED NOT DETECTED   Candida tropicalis NOT DETECTED NOT DETECTED   Name of physician (or Provider) Contacted: Dr Roderic Palau  Changes to prescribed antibiotics required: Continue Rocephin  Hart Robinsons A 12/28/2016  12:40 PM

## 2016-12-28 NOTE — Progress Notes (Signed)
[place] A. Merlene Laughter, MD     [email or web address]          [name] [name] is an 56 y.o. male.   ASSESSMENT/PLAN: 1. Acute encephalopathy: The most likely etiology is from hepatic disease. Medication effect is also likely a contributing factor. Spinal fluid does not indicate CNS infection.   2. Small intracranial hemorrhage which seems to be improving slightly. Unlikely to cause encephalopathy given the small size.   3. Marked thrombocytopenia:     4. Gait disorder due to #1.  5. Positive Blood culture   6. CSF findings due to intracranial hemorrhage.  7. Marked dysarthria out of proportion to encephalopathy and rest of the evaluation. However, MRI does not show acute focal findings to explain the dysarthria.  8. There is severe hearing impairment which may contribute to some of the confusion.     Overall the patient remains about the same. He is still confused and moderately encephalopathic.    GENERAL: He is calm and cooperates with evaluation.  HEENT: Neck is supple.  ABDOMEN: soft  EXTREMITIES: No edema   BACK: Normal  SKIN: Normal by inspection.    MENTAL STATUS: He is awake and alert. He does follow midline commands and to some degree appendicular commands. Speech is markedly dysarthric. There is severe hearing impairment which I think contributes to the patient having difficulties following commands. The patient knows that he is in the hospital at [facility].  CRANIAL NERVES: Pupils R non reactive and cloudy, L round and reactive to light; extra ocular movements are full, there is no significant nystagmus; visual fields are full; upper and lower facial muscles are normal in strength and symmetric, there is no flattening of the nasolabial folds; tongue is midline; uvula is midline.  MOTOR: Bulk and tone are normal throughout. He has antigravity strength in both upper and lower extremities bilaterally.  COORDINATION: No asterixis; no  tremors, no dysmetria or parkinsonism.  SENSATION: He does respond to painful some light bilaterally although this is reduced.       Blood pressure (!) 143/83, pulse 63, temperature 98.5 F (36.9 C), temperature source Oral, resp. rate 18, height '5\' 8"'$  (1.727 m), weight 229 lb 15 oz (104.3 kg), SpO2 100 %.  Past Medical History:  Diagnosis Date  . Anemia   . Anxiety   . Arthritis   . Bipolar 1 disorder ([place])   . Blind right eye   . Blood clots in brain    per patient  . Cardiomegaly   . Cataract    Right eye  . Cerebrovascular disease    Right vertebral artery  . Cholelithiasis    Asymptomatic  . Chronic back pain   . Cirrhosis ([place])    suspected NASH  . Cirrhosis ([place])    diagnosed 07/2010 when presented with first episode of hepatic encephalopathy, afp on 416/13=4.8,  U/S on 12/14/11  liver stable, pt has received 2 hep A/B vaccines  . COPD (chronic obstructive pulmonary disease) ([place])   . Depression   . Diabetes mellitus   . Diverticulosis   . GERD (gastroesophageal reflux disease)   . Hard of hearing   . Hepatic encephalopathy ([place])   . Hyperlipidemia   . Hypertension   . Mental retardation   . Seizures ([place])   . Thrombocytopenia ([place])     Past Surgical History:  Procedure Laterality Date  . CAST APPLICATION  10/25/3297   Procedure: MINOR CAST APPLICATION;  Surgeon: [name], MD;  Location:  [facility];  Service: Orthopedics;  Laterality: Right;  no anesthesia , procedure room do not need or room !  . COLONOSCOPY  12/15/11   colonic divericulosis;poor prep, ACBE  recommended but has not been done  . ear operations    . ESOPHAGOGASTRODUODENOSCOPY  02/02/2011   [place]k-Normal esophagus.  No varices/Question mild portal gastropathy, extrinsic compression on the antrum, lesser curvature of uncertain significance, some minimally  nodular mucosa status post biopsy, patent pylorus, normal duodenum 1 and duodenum 2.  . ESOPHAGOGASTRODUODENOSCOPY  12/15/11    Rourk-->mild changes of portal gastropathy, gastric/duodenal erosions s/p bx  (reactive changes with mild chronic inflammation. No H.pylori  . FOOT SURGERY    . ORIF ANKLE FRACTURE  08/27/2011   Procedure: OPEN REDUCTION INTERNAL FIXATION (ORIF) ANKLE FRACTURE;  Surgeon: [name], MD;  Location: [facility];  Service: Orthopedics;  Laterality: Right;  . TONSILLECTOMY      Family History  Problem Relation Age of Onset  . Heart failure Mother   . Thyroid disease Mother   . Cancer Father   . Asthma Brother   . Heart attack Brother   . Cirrhosis Brother     EtOH  . Colon cancer Maternal Aunt     Social History:  reports that he has been smoking Cigarettes.  He has a 30.00 pack-year smoking history. He has never used smokeless tobacco. He reports that he does not drink alcohol or use drugs.  Allergies:  Allergies  Allergen Reactions  . Penicillins Rash    Has patient had a PCN reaction causing immediate rash, facial/tongue/throat swelling, SOB or lightheadedness with hypotension: Yes Has patient had a PCN reaction causing severe rash involving mucus membranes or skin necrosis: No Has patient had a PCN reaction that required hospitalization No Has patient had a PCN reaction occurring within the last 10 years: No If all of the above answers are "NO", then may proceed with Cephalosporin use.     Medications: Prior to Admission medications   Medication Sig Start Date End Date Taking? Authorizing Provider  ALPRAZolam Duanne Moron) 0.5 MG tablet Take 0.5 mg by mouth 3 (three) times daily as needed for anxiety.   Yes Historical Provider, MD  aspirin EC 81 MG tablet Take 81 mg by mouth daily.   Yes Historical Provider, MD  ezetimibe (ZETIA) 10 MG tablet Take 10 mg by mouth daily.   Yes Historical Provider, MD  gabapentin (NEURONTIN) 300 MG capsule Take 300 mg by mouth 3 (three) times daily.   Yes Historical Provider, MD  lactulose (CHRONULAC) 10 GM/15ML solution Take 60 mLs (40 g total) by  mouth 3 (three) times daily. Titrate for 3-4 bowel motions daily. 12/15/16  Yes Albertine Patricia, MD  metoprolol tartrate (LOPRESSOR) 25 MG tablet Take 25 mg by mouth 3 (three) times daily. Take 25 mg in the morning and 50 mg at night   Yes Historical Provider, MD  Multiple Vitamin (MULTIVITAMIN WITH MINERALS) TABS Take 1 tablet by mouth daily.   Yes Historical Provider, MD  omeprazole (PRILOSEC) 20 MG capsule Take 20 mg by mouth daily.   Yes Historical Provider, MD  potassium chloride SA (K-DUR,KLOR-CON) 20 MEQ tablet Take 20 mEq by mouth 2 (two) times daily.   Yes Historical Provider, MD    Scheduled Meds: . chlorhexidine  15 mL Mouth Rinse BID  . Chlorhexidine Gluconate Cloth  6 each Topical Daily  . ezetimibe  10 mg Oral Daily  . fluconazole  100 mg Oral Daily  . lactulose  40 g Oral TID  . LORazepam  1 mg Oral TID  . mouth rinse  15 mL Mouth Rinse q12n4p  . metoprolol tartrate  50 mg Oral QHS  . metoprolol tartrate  25 mg Oral Daily  . multivitamin with minerals  1 tablet Oral Daily  . nystatin   Topical TID  . pantoprazole  40 mg Oral Daily  . potassium chloride SA  20 mEq Oral BID  . rifaximin  550 mg Oral BID  . risperiDONE  0.5 mg Oral BID  . sodium chloride flush  10-40 mL Intracatheter Q12H   Continuous Infusions: . sodium chloride 100 mL/hr at 12/28/16 0014  . cefTRIAXone (ROCEPHIN)  IV Stopped (12/27/16 1621)   PRN Meds:.acetaminophen, acetaminophen, albuterol, ALPRAZolam, hydrALAZINE, LORazepam, sodium chloride flush     Results for orders placed or performed during the hospital encounter of 12/21/16 (from the past 48 hour(s))  CBC     Status: Abnormal   Collection Time: 12/28/16  4:40 AM  Result Value Ref Range   WBC 3.6 (L) 4.0 - 10.5 K/uL   RBC 3.28 (L) 4.22 - 5.81 MIL/uL   Hemoglobin 10.3 (L) 13.0 - 17.0 g/dL   HCT 31.6 (L) 39.0 - 52.0 %   MCV 96.3 78.0 - 100.0 fL   MCH 31.4 26.0 - 34.0 pg   MCHC 32.6 30.0 - 36.0 g/dL   RDW 15.9 (H) 11.5 - 15.5 %    Platelets 63 (L) 150 - 400 K/uL    Comment: SPECIMEN CHECKED FOR CLOTS  Basic metabolic panel     Status: Abnormal   Collection Time: 12/28/16  4:40 AM  Result Value Ref Range   Sodium 139 135 - 145 mmol/L   Potassium 3.7 3.5 - 5.1 mmol/L   Chloride 104 101 - 111 mmol/L   CO2 30 22 - 32 mmol/L   Glucose, Bld 97 65 - 99 mg/dL   BUN <5 (L) 6 - 20 mg/dL   Creatinine, Ser 0.51 (L) 0.61 - 1.24 mg/dL   Calcium 8.3 (L) 8.9 - 10.3 mg/dL   GFR calc non Af Amer >60 >60 mL/min   GFR calc Af Amer >60 >60 mL/min    Comment: (NOTE) The eGFR has been calculated using the CKD EPI equation. This calculation has not been validated in all clinical situations. eGFR's persistently <60 mL/min signify possible Chronic Kidney Disease.    Anion gap 5 5 - 15  Ammonia     Status: None   Collection Time: 12/28/16  4:40 AM  Result Value Ref Range   Ammonia 22 9 - 35 umol/L    Studies/Results:  HEAD CT FINDINGS: Brain: Trace hyperdensity along the left aspect of the interhemispheric fissure and medial left frontal lobe on series 2, image 24 is stable and is most compatible with trace subarachnoid hemorrhage. A trace amount of parafalcine subarachnoid is also visible on the opposite side (series 2, images 22 and 23). No associated mass effect or cerebral edema.  No other acute intracranial hemorrhage identified. No intraventricular blood.  No midline shift, ventriculomegaly, mass effect, evidence of mass lesion, or cortically based acute infarction. Gray-white matter differentiation is within normal limits throughout the brain.  Vascular: Calcified atherosclerosis at the skull base.  Skull: Stable and intact.  No acute osseous abnormality identified.  Sinuses/Orbits: Previous mastoidectomies appears stable. Stable paranasal sinus pneumatization, anterior left ethmoid mucosal thickening and opacification.  Other: Right phthisis bulbi. Left orbits soft tissues are stable and intact. Mild  broad-based posterior  convexity scalp hematoma has regressed (series 3, image 30). No other scalp hematoma or acute superficial soft tissue injury identified.  IMPRESSION: 1. Small parafalcine density along the medial frontal lobes is stable an most compatible with a trace amount of posttraumatic Subarachnoid Hemorrhage. 2. No associated mass effect and no other acute intracranial abnormality. 3. Regressed mild posterior convexity scalp hematoma. No skull fracture identified.      EEG 12/15/16 This recording is abnormal due to the following reasons: 1. Moderately severe global slowing indicating a moderate to severe global encephalopathy. 2. Multiple triphasic waves which are typically seen in toxic metabolic encephalopathies particularly from hepatic failure or renal failure.     BRAIN MRI Brain: The midline structures are normal. No focal diffusion restriction to indicate acute infarct. No intraparenchymal hemorrhage. Mild hyperintense T2 weighted signal within the periventricular white matter adjacent to the left lateral ventricle occipital horn. No mass lesion. Susceptibility weighted imaging is severely motion degraded. No hydrocephalus, age advanced atrophy or lobar predominant volume loss. No dural abnormality or extra-axial collection.  Vascular: Major intracranial arterial and venous sinus flow voids are preserved.  Skull and upper cervical spine: The visualized skull base, calvarium, upper cervical spine and extracranial soft tissues are normal.  Sinuses/Orbits: No fluid levels or advanced mucosal thickening. No mastoid effusion. Normal orbits.  IMPRESSION: 1. Motion degraded examination, particularly of the susceptibility weighted images. 2. Within the above limitation, no visible acute intracranial abnormality.     Brain MRI is reviewed in person. No Acute changes on DWI, TI and SWI are normal ( although SWI is degraded by motion artifact).  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            Nadyne Gariepy A. Merlene Laughter, M.D.  Diplomate, [employer] adviser of Psychiatry and Neurology ( Neurology). 12/28/2016, 8:20 AM

## 2016-12-28 NOTE — Progress Notes (Signed)
PROGRESS NOTE    [name] [name]  VEH:[id number] DOB: 1960/09/23 DOA: 12/21/2016 PCP: Hermine Messick, MD    Brief Narrative:  56 year old male with history of [name] cirrhosis and repeated admissions for hepatic encephalopathy related to medication noncompliance, was recently discharged on 4/25 after being treated for hepatic encephalopathy. He was reportedly doing well after discharge, the following day became increasingly confused again. He began having multiple falls. He was brought back to the ER on 5/1 and admitted for recurrent encephalopathy. CT scan of the head showed scalp hematoma from fall as well as parenchymal contusion. Ammonia was mildly elevated. He began having fevers and had 1/2 positive blood cultures for strep species. After starting antibiotics, lethargy resolved. He has since been agitated and requires a Actuary. He is receiving ativan and risperdal. Once sitter can be discontinued, he can be discharged to SNF   Assessment & Plan:   Active Problems:   COPD (chronic obstructive pulmonary disease) (HCC)   HTN (hypertension)   Cirrhosis (HCC)   Thrombocytopenia (HCC)   Hyperammonemia (HCC)   Altered mental state   Anxiety   Fall   Cerebral contusion (HCC)   Encephalopathy   1. Acute encephalopathy. Unclear etiology. Ammonia level has been mildly elevated, but it is unclear if this degree of elevation has clinical significance. He was taking benzodiazepines prior to admission, but this dose has been decreased since admission and effectively, patient has not been receiving any benzos since admission. CT head did show small subarachnoid hemorrhage, but this was unchanged on follow up CT. ABG did not show elevated PCO2. He was having fevers without any clear source of infection. Urinalysis, chest xray and abdominal exam are benign. Blood culture positive for strep species. He was started on intravenous antibiotics and lethargy improved. Neurology following. Lumbar puncture did  not show any signs of CNS infection. Currently, he is having episodes of agitation, but I suspect to some degree this may be his baseline. He is still requiring sitter precautions since he keeps trying to get out of bed and is at risk for falling. Continue ativan as needed.  2. Cerebral contusion/subarachnoid hemorrhage. ED physician had discussed case with neurosurgery on call at the time of admission and no surgery was recommended at that time. It was felt patient could be monitored at [facility]. CT head was repeated on 5/2 with no significant changes. No further work up at this time.  3. Hyperammonemia. Patient has had recurrent admissions for hepatic encephalopathy due to noncompliance with lactulose. Ammonia level has improved. He is receiving lactulose and is having bowel movements. He is also on xifaxan. Continue current treatments.  4. Nash cirrhosis. Follow-up with GI as an outpatient  5. Thrombocytopenia. Related to liver disease/infection. No signs of bleeding. Continue to monitor  6. Hypertension. Currently on metoprolol. Blood pressure has been stable.  7. Multiple falls. Related to generalized weakness and encephalopathy. Physical therapy has seen the patient and recommends SNF.  8. Fever/Bacteremia. Patient had one out of two sets positive blood culture for strep viridans. Source of bacteremia is not entirely clear. He is on intravenous antibiotics. Discussed with Dr. Megan Salon on call for ID. Will transition patient to keflex to complete a 10 day course of antibiotics   DVT prophylaxis: scd Code Status: DNR Family Communication: no family present Disposition Plan: SNF placement on discharge   Consultants:   neurology  Procedures:   Lumbar puncture 5/4  Antimicrobials:   Vancomycin 5/2>>5/5  Meropenem 5/2>>5/5  Ceftriaxone 5/5>>5/8  keflex 5/8>>    Subjective: Still has safety sitter, tries to climb out of bed  Objective: Vitals:   12/27/16 0721  12/27/16 2051 12/28/16 0727 12/28/16 1300  BP:  (!) 166/80 (!) 143/83 (!) 151/71  Pulse:  86 63 72  Resp:  18 18 18   Temp:  99.3 F (37.4 C) 98.5 F (36.9 C) 98.7 F (37.1 C)  TempSrc:  Oral Oral Oral  SpO2: 96% 100% 100% 97%  Weight:      Height:        Intake/Output Summary (Last 24 hours) at 12/28/16 1749 Last data filed at 12/28/16 1736  Gross per 24 hour  Intake              480 ml  Output              600 ml  Net             -120 ml   Filed Weights   12/24/16 0500 12/25/16 0330 12/26/16 0500  Weight: 98.1 kg (216 lb 4.3 oz) 102.6 kg (226 lb 3.1 oz) 104.3 kg (229 lb 15 oz)    Examination:  General exam: Alert, awake, able to carry on a conversation Respiratory system: Clear to auscultation. Respiratory effort normal. Cardiovascular system:RRR. No murmurs, rubs, gallops. Gastrointestinal system: Abdomen is nondistended, soft and nontender. No organomegaly or masses felt. Normal bowel sounds heard. Central nervous system: .No focal neurological deficits. Extremities: No C/C/E, +pedal pulses Skin: No rashes, lesions or ulcers Psychiatry: confused at times, trying to climb out of bed    Data Reviewed: I have personally reviewed following labs and imaging studies  CBC:  Recent Labs Lab 12/23/16 0433 12/24/16 0526 12/25/16 0453 12/26/16 0520 12/28/16 0440  WBC 6.0 3.4* 4.3 3.4* 3.6*  HGB 12.7* 11.0* 10.6* 10.4* 10.3*  HCT 38.5* 34.7* 32.3* 31.8* 31.6*  MCV 96.3 99.4 97.9 97.0 96.3  PLT 42* 74* 78* 67* 63*   Basic Metabolic Panel:  Recent Labs Lab 12/23/16 0433 12/24/16 0526 12/25/16 0453 12/26/16 0520 12/28/16 0440  NA 139 149* 138 139 139  K 3.2* 3.5 3.8 3.5 3.7  CL 108 117* 109 107 104  CO2 22 24 26 29 30   GLUCOSE 160* 219* 106* 98 97  BUN 16 17 10 8  <5*  CREATININE 0.83 0.69 0.55* 0.55* 0.51*  CALCIUM 8.7* 8.7* 8.2* 7.9* 8.3*   GFR: Estimated Creatinine Clearance: 119.3 mL/min (A) (by C-G formula based on SCr of 0.51 mg/dL (L)). Liver  Function Tests:  Recent Labs Lab 12/23/16 0433  AST 41  ALT 17  ALKPHOS 83  BILITOT 2.3*  PROT 6.5  ALBUMIN 3.0*   No results for input(s): LIPASE, AMYLASE in the last 168 hours.  Recent Labs Lab 12/22/16 0502 12/23/16 0433 12/25/16 0453 12/26/16 0520 12/28/16 0440  AMMONIA 56* 52* 94* 40* 22   Coagulation Profile: No results for input(s): INR, PROTIME in the last 168 hours. Cardiac Enzymes: No results for input(s): CKTOTAL, CKMB, CKMBINDEX, TROPONINI in the last 168 hours. BNP (last 3 results) No results for input(s): PROBNP in the last 8760 hours. HbA1C: No results for input(s): HGBA1C in the last 72 hours. CBG:  Recent Labs Lab 12/22/16 1430  GLUCAP 106*   Lipid Profile: No results for input(s): CHOL, HDL, LDLCALC, TRIG, CHOLHDL, LDLDIRECT in the last 72 hours. Thyroid Function Tests: No results for input(s): TSH, T4TOTAL, FREET4, T3FREE, THYROIDAB in the last 72 hours. Anemia Panel: No results for input(s): VITAMINB12, FOLATE, FERRITIN, TIBC,  IRON, RETICCTPCT in the last 72 hours. Sepsis Labs: No results for input(s): PROCALCITON, LATICACIDVEN in the last 168 hours.  Recent Results (from the past 240 hour(s))  MRSA PCR Screening     Status: None   Collection Time: 12/21/16  4:25 PM  Result Value Ref Range Status   MRSA by PCR NEGATIVE NEGATIVE Final    Comment:        The GeneXpert MRSA Assay (FDA approved for NASAL specimens only), is one component of a comprehensive MRSA colonization surveillance program. It is not intended to diagnose MRSA infection nor to guide or monitor treatment for MRSA infections.   Culture, blood (routine x 2)     Status: None   Collection Time: 12/22/16  5:31 PM  Result Value Ref Range Status   Specimen Description BLOOD LEFT HAND  Final   Special Requests   Final    BOTTLES DRAWN AEROBIC ONLY Blood Culture adequate volume   Culture NO GROWTH 5 DAYS  Final   Report Status 12/27/2016 FINAL  Final  Culture, blood  (routine x 2)     Status: Abnormal   Collection Time: 12/22/16  5:40 PM  Result Value Ref Range Status   Specimen Description LEFT ANTECUBITAL  Final   Special Requests   Final    BOTTLES DRAWN AEROBIC AND ANAEROBIC Blood Culture adequate volume   Culture  Setup Time   Final    GRAM POSITIVE COCCI Gram Stain Report Called to,Read Back By and Verified With: HOWARD C AT 1250 ON [id number] BY THOMPSON S. AEROBIC BOTTLE ONLY    Culture (A)  Final    VIRIDANS STREPTOCOCCUS THE SIGNIFICANCE OF ISOLATING THIS ORGANISM FROM A SINGLE SET OF BLOOD CULTURES WHEN MULTIPLE SETS ARE DRAWN IS UNCERTAIN. PLEASE NOTIFY THE MICROBIOLOGY DEPARTMENT WITHIN ONE WEEK IF SPECIATION AND SENSITIVITIES ARE REQUIRED. Performed at [facility], [place] [address] [place],  [address]    Report Status 12/25/2016 FINAL  Final  Blood Culture ID Panel (Reflexed)     Status: Abnormal   Collection Time: 12/22/16  5:40 PM  Result Value Ref Range Status   Enterococcus species NOT DETECTED NOT DETECTED Final   Listeria monocytogenes NOT DETECTED NOT DETECTED Final   Staphylococcus species NOT DETECTED NOT DETECTED Final   Staphylococcus aureus NOT DETECTED NOT DETECTED Final   Streptococcus species DETECTED (A) NOT DETECTED Final    Comment: Not Enterococcus species, Streptococcus agalactiae, Streptococcus pyogenes, or Streptococcus pneumoniae. CRITICAL RESULT CALLED TO, READ BACK BY AND VERIFIED WITH: Tana Conch RN 17:30 12/23/16 ([email or web address])    Streptococcus agalactiae NOT DETECTED NOT DETECTED Final   Streptococcus pneumoniae NOT DETECTED NOT DETECTED Final   Streptococcus pyogenes NOT DETECTED NOT DETECTED Final   Acinetobacter baumannii NOT DETECTED NOT DETECTED Final   Enterobacteriaceae species NOT DETECTED NOT DETECTED Final   Enterobacter cloacae complex NOT DETECTED NOT DETECTED Final   Escherichia coli NOT DETECTED NOT DETECTED Final   Klebsiella oxytoca NOT DETECTED NOT DETECTED Final   Klebsiella  pneumoniae NOT DETECTED NOT DETECTED Final   Proteus species NOT DETECTED NOT DETECTED Final   Serratia marcescens NOT DETECTED NOT DETECTED Final   Haemophilus influenzae NOT DETECTED NOT DETECTED Final   Neisseria meningitidis NOT DETECTED NOT DETECTED Final   Pseudomonas aeruginosa NOT DETECTED NOT DETECTED Final   Candida albicans NOT DETECTED NOT DETECTED Final   Candida glabrata NOT DETECTED NOT DETECTED Final   Candida krusei NOT DETECTED NOT DETECTED Final   Candida parapsilosis NOT  DETECTED NOT DETECTED Final   Candida tropicalis NOT DETECTED NOT DETECTED Final    Comment: Performed at [facility], [place] [address] [place], [place] [address]  Culture, blood (routine x 2)     Status: None   Collection Time: 12/23/16  8:09 PM  Result Value Ref Range Status   Specimen Description BLOOD PICC LINE  Final   Special Requests   Final    BOTTLES DRAWN AEROBIC AND ANAEROBIC Blood Culture results may not be optimal due to an inadequate volume of blood received in culture bottles   Culture NO GROWTH 5 DAYS  Final   Report Status 12/28/2016 FINAL  Final  Culture, blood (routine x 2)     Status: None   Collection Time: 12/23/16  8:10 PM  Result Value Ref Range Status   Specimen Description BLOOD LEFT ANTECUBITAL  Final   Special Requests   Final    BOTTLES DRAWN AEROBIC AND ANAEROBIC Blood Culture adequate volume   Culture NO GROWTH 5 DAYS  Final   Report Status 12/28/2016 FINAL  Final  CSF culture     Status: None   Collection Time: 12/24/16  3:08 PM  Result Value Ref Range Status   Specimen Description CSF  Final   Special Requests NONE  Final   Gram Stain   Final    NO ORGANISMS SEEN WBC PRESENT, PREDOMINANTLY PMN Gram Stain Report Called to,Read Back By and Verified With: GAMMONS S. AT [id number] ON [id number] BY THOMPSON S.    Culture   Final    NO GROWTH 3 DAYS Performed at [facility], [place] [address], [name], [place] [address]    Report Status 12/27/2016 FINAL  Final           Radiology Studies: Mr Brain Wo Contrast  Result Date: 12/27/2016 CLINICAL DATA:  Fall 3 days ago.  Possible subarachnoid hematoma. EXAM: MRI HEAD WITHOUT CONTRAST TECHNIQUE: Multiplanar, multiecho pulse sequences of the brain and surrounding structures were obtained without intravenous contrast. COMPARISON:  Head CT 12/22/2016, 12/21/2016 FINDINGS: Despite efforts by the technologist and patient, motion artifact is present on today's examination and could not be eliminated. The findings of this study are interpreted in the context of that limitation. Brain: The midline structures are normal. No focal diffusion restriction to indicate acute infarct. No intraparenchymal hemorrhage. Mild hyperintense T2 weighted signal within the periventricular white matter adjacent to the left lateral ventricle occipital horn. No mass lesion. Susceptibility weighted imaging is severely motion degraded. No hydrocephalus, age advanced atrophy or lobar predominant volume loss. No dural abnormality or extra-axial collection. Vascular: Major intracranial arterial and venous sinus flow voids are preserved. Skull and upper cervical spine: The visualized skull base, calvarium, upper cervical spine and extracranial soft tissues are normal. Sinuses/Orbits: No fluid levels or advanced mucosal thickening. No mastoid effusion. Normal orbits. IMPRESSION: 1. Motion degraded examination, particularly of the susceptibility weighted images. 2. Within the above limitation, no visible acute intracranial abnormality. Electronically Signed   By: Ulyses Jarred M.D.   On: 12/27/2016 16:37        Scheduled Meds: . chlorhexidine  15 mL Mouth Rinse BID  . Chlorhexidine Gluconate Cloth  6 each Topical Daily  . ezetimibe  10 mg Oral Daily  . fluconazole  100 mg Oral Daily  . lactulose  40 g Oral TID  . LORazepam  1 mg Oral TID  . mouth rinse  15 mL Mouth Rinse q12n4p  . metoprolol tartrate  50  mg Oral QHS  . metoprolol tartrate   25 mg Oral Daily  . multivitamin with minerals  1 tablet Oral Daily  . nystatin   Topical TID  . pantoprazole  40 mg Oral Daily  . potassium chloride SA  20 mEq Oral BID  . rifaximin  550 mg Oral BID  . risperiDONE  0.5 mg Oral BID  . sodium chloride flush  10-40 mL Intracatheter Q12H   Continuous Infusions: . sodium chloride 100 mL/hr at 12/28/16 0014  . cefTRIAXone (ROCEPHIN)  IV Stopped (12/27/16 1621)     LOS: 6 days    Time spent: 2mns    MEMON,JEHANZEB, MD [facility] Pager [phone] If 7PM-7AM, please contact night-coverage [email or web address] Password [email or web address]5/03/2017, 5:49 PM

## 2016-12-29 DIAGNOSIS — R7881 Bacteremia: Secondary | ICD-10-CM

## 2016-12-29 DIAGNOSIS — G9340 Encephalopathy, unspecified: Secondary | ICD-10-CM

## 2016-12-29 DIAGNOSIS — D696 Thrombocytopenia, unspecified: Secondary | ICD-10-CM

## 2016-12-29 DIAGNOSIS — K7469 Other cirrhosis of liver: Secondary | ICD-10-CM

## 2016-12-29 DIAGNOSIS — I609 Nontraumatic subarachnoid hemorrhage, unspecified: Secondary | ICD-10-CM

## 2016-12-29 HISTORY — DX: Bacteremia: R78.81

## 2016-12-29 LAB — CBC
HEMATOCRIT: 33.8 % — AB (ref 39.0–52.0)
HEMOGLOBIN: 10.9 g/dL — AB (ref 13.0–17.0)
MCH: 31.4 pg (ref 26.0–34.0)
MCHC: 32.2 g/dL (ref 30.0–36.0)
MCV: 97.4 fL (ref 78.0–100.0)
Platelets: 65 10*3/uL — ABNORMAL LOW (ref 150–400)
RBC: 3.47 MIL/uL — ABNORMAL LOW (ref 4.22–5.81)
RDW: 15.7 % — ABNORMAL HIGH (ref 11.5–15.5)
WBC: 3.5 10*3/uL — AB (ref 4.0–10.5)

## 2016-12-29 LAB — BASIC METABOLIC PANEL
ANION GAP: 6 (ref 5–15)
BUN: <5 mg/dL — ABNORMAL LOW (ref 6–20)
CALCIUM: 8.4 mg/dL — AB (ref 8.9–10.3)
CO2: 29 mmol/L (ref 22–32)
Chloride: 104 mmol/L (ref 101–111)
Creatinine, Ser: 0.57 mg/dL — ABNORMAL LOW (ref 0.61–1.24)
GFR calc Af Amer: >60 mL/min (ref >60)
GLUCOSE: 98 mg/dL (ref 65–99)
POTASSIUM: 4 mmol/L (ref 3.5–5.1)
Sodium: 139 mmol/L (ref 135–145)

## 2016-12-29 NOTE — Progress Notes (Signed)
Walked by and saw tech with patient, patient was in floor on his knees leaning on the bed. Patient was disoriented as he had been all this morning on my shift. I assisted the tech to help him back in the bed, no new injuries were noted. Patient was talking about needing to go to the store and asking [place] if we had a "light" I assisted the tech in cleaning up the patient and changing his bed linens. I then text paged the MD to let him know about the incident and he said he was going to come assess the patient to see if he needs a sitter back. I then attempted to contact the family, but was not able to because all of the phone numbers listed for the patient's family was out of order.

## 2016-12-29 NOTE — Clinical Social Work Note (Signed)
Late entry for 12/28/16: LCSW attempted to contact wife and received a loud ringing sound from number.    Lamount Bankson, Clydene Pugh, LCSW

## 2016-12-29 NOTE — Plan of Care (Signed)
Problem: Safety: Goal: Ability to remain free from injury will improve Outcome: Progressing Pt tele sitter d/c at 12/28/16 23:00. Pt has been calm and resting as of 02:52 12/29/16.

## 2016-12-29 NOTE — Progress Notes (Signed)
Initial Nutrition Assessment  DOCUMENTATION CODES:  Obesity unspecified  INTERVENTION:  Continue to monitor PO intake and add supplement, vitamins or provide education as warranted  NUTRITION DIAGNOSIS:  Increased nutrient needs related to wound healing, chronic illness (Cirrhosis) as evidenced by estimated nutritional requirements for this outcome and for this disease state  GOAL:  Patient will meet greater than or equal to 90% of their needs  MONITOR:  PO intake, Diet advancement, Labs, Weight trends  REASON FOR ASSESSMENT:  LOS, Low Braden    ASSESSMENT:  56 y/o male PMHx NASH cirrhosis, Hepatic Encephalopathy, DM, seizure disorder, Bipolar disorder, mental retardation, Anxiety, Depression, DM2, HTN, HLD, COPD. Hospitalized 422-4/24 for hepatic Encephalopathy. Presented with ongoing confusion (starting 1 day after past admission) and multiple falls. CT showed small subarachnoid hemorrhage.  Pt seen for LOS and prior LB. He still has acute encephalopathy of unclear etiology.  Per intake records, patient ate nothing 5/1-5/3, but since that time he appears to be consuming >50% of meals, however, per nursing today, he still has meals where he eats very little. Ate poor at breakfast yesterday and today.   Pt's bed weight today is 101.6 kg or 224 lbs. Per Care Everywhere, he was 228 lbs this past March, 225 this past April. Lowest wt this admission has been 216 lbs. No significant wt loss detected  Pt today is obtunded and does not respond to vocal or physical stimulus. PLast ativan dose 1002  With reported PO intake, currently not malnourished, continue to monitor.   Physical Exam: WDL  Labs : BG 95-105, Ammonia 22 Medications: Lactulose, ABx, Diflucan, Ativan, MVI, KCL, ppi, IVF   Recent Labs Lab 12/26/16 0520 12/28/16 0440 12/29/16 0542  NA 139 139 139  K 3.5 3.7 4.0  CL 107 104 104  CO2 '29 30 29  '$ BUN 8 <5* <5*  CREATININE 0.55* 0.51* 0.57*  CALCIUM 7.9* 8.3* 8.4*   GLUCOSE 98 97 98   Diet Order:  Diet Heart Room service appropriate? Yes; Fluid consistency: Thin  Skin:  Abrasions to back, head, and legs. Blisters+Cracking to feet, MSAD to buttocks and groin. Non pressure wound to head. PU stage 1 to medial buttocks  Last BM:  5/8-loose stools-lactulose  Height:  Ht Readings from Last 1 Encounters:  12/21/16 '5\' 8"'$  (1.727 m)   Weight:  Wt Readings from Last 1 Encounters:  12/29/16 223 lb 15.8 oz (101.6 kg)   Wt Readings from Last 10 Encounters:  12/29/16 223 lb 15.8 oz (101.6 kg)  12/13/16 250 lb (113.4 kg)  10/29/16 227 lb (103 kg)  12/10/15 226 lb 6.6 oz (102.7 kg)  02/14/14 160 lb (72.6 kg)  10/24/13 195 lb 1.7 oz (88.5 kg)  08/02/13 206 lb 9.1 oz (93.7 kg)  05/17/13 207 lb 0.2 oz (93.9 kg)  04/17/13 199 lb 8.3 oz (90.5 kg)  02/02/13 203 lb 9.6 oz (92.4 kg)   Ideal Body Weight:  70 kg  BMI:  Body mass index is 34.06 kg/m.  Estimated Nutritional Needs:  Kcal:  1850-2050 (18-20 kcal/kg bw) Protein:  91-105 g Pro (1.3-1.5 g/kg ibw) Fluid:  1.9-2.1 Liters (1 ml/kcal)  EDUCATION NEEDS:  No education needs identified at this time  Burtis Junes RD, LDN, [place] Pager: [phone] 12/29/2016 2:34 PM

## 2016-12-29 NOTE — Clinical Social Work Note (Signed)
LCSW attempted to contact patient's wife for bed offers and again received loud ringing tone. LCSW attempted to contact another person who was listed on the chart and the number was not working.    LCSW contacted law enforcement to do a welfare check on patient's wife as staff has attempted contact multiple times.       Aqua Denslow, Clydene Pugh, LCSW

## 2016-12-29 NOTE — Progress Notes (Signed)
PROGRESS NOTE  [name] [name] OYD:[id number] DOB: 08-10-1961 DOA: 12/21/2016 PCP: Hermine Messick, MD  Brief Narrative: 56 year old man PMH cirrhosis, multiple episodes of hepatic encephalopathy secondary to noncompliance, seizure disorder, recent hospitalization for hepatic encephalopathy; presented to the emergency department after a fall at home. Admitted for acute encephalopathy, cerebral contusion (per neurosurgery no treatment recommended). He remained confused and encephalopathic, developed fever and was found to have bacteremia. Lethargy resolved with antibiotics but the patient has remained confused, agitated and had a fall 5/8 necessitating a safety sitter be reinstituted.  Assessment/Plan 1. Acute encephalopathy. Etiology unclear, ammonia level was mildly elevated on admission. No history of significant benzodiazepine use. CT with small subarachnoid hemorrhage unchanged on follow-up. No intervention per neurosurgery. Evaluated by neurology. Felt to be secondary to hepatic disease. Status post lumbar puncture with no evidence of CNS infection. ABG unremarkable. Oriented to self only but did fall this morning. Etiology of dysarthria unclear. 2. Small subarachnoid hemorrhage, traumatic. Stable on CT. No further evaluation suggested. 3. Hyperammonemia, resolved with lactulose. 4. Multiple falls at home per chart. According to chart wife was concerned the patient was taking too much Xanax.  5. Dysarthria. Etiology unclear. MRI brain negative. 6. Streptococcus viridans bacteremia, per infectious disease treat with 10 days of oral antibiotics.  7. NASH cirrhosis with multiple episodes of hepatic encephalopathy, associated with thrombocytopenia. 8. Seizure disorder 9. PMH bipolar 1 disorder, mental retardation 10. COPD 56. Obesity   Complicated patient with unclear etiology for acute encephalopathy, however improvement apparently has been made since admission. Bipolar disorder and mental  retardation complicate picture. No evidence of reversible or acute illness this point. Plan safety sitter today. Stop her estradiol. Reevaluate in the morning. Assess fall risk in the morning. Plan SNF when sitter discontinued.  DVT prophylaxis: SCDs Code Status: DNR Family Communication:  Disposition Plan: SNF    [name], MD  [facility] Direct contact: [phone] --Via amion app OR  --[email or web address]; password [email or web address]  7PM-7AM contact night coverage as above 12/29/2016, 6:38 PM  LOS: 7 days   Consultants:  Neurology  Procedures:  5/4: Lumbar puncture  Antimicrobials:  Vancomycin 5/2>>5/5  Meropenem 5/2>>5/5  Ceftriaxone 5/5>>5/8  keflex 5/8>>   Interval history/Subjective: Confused this morning per nursing. Dysarthric. Was found on the floor this morning having fallen out of bed.   Objective: Vitals:   12/28/16 2200 12/29/16 0640 12/29/16 1430 12/29/16 1450  BP: (!) 155/80 (!) 145/79  (!) 148/74  Pulse: 78   64  Resp: 18 20  18   Temp: 98.1 F (36.7 C) 97.4 F (36.3 C)  98.4 F (36.9 C)  TempSrc: Oral Oral  Axillary  SpO2: 100% 95%  100%  Weight:   101.6 kg (223 lb 15.8 oz)   Height:        Intake/Output Summary (Last 24 hours) at 12/29/16 1838 Last data filed at 12/29/16 0700  Gross per 24 hour  Intake               60 ml  Output             1600 ml  Net            -1540 ml     Filed Weights   12/25/16 0330 12/26/16 0500 12/29/16 1430  Weight: 102.6 kg (226 lb 3.1 oz) 104.3 kg (229 lb 15 oz) 101.6 kg (223 lb 15.8 oz)    Exam:    Constitutional: Confused, however calm, lying in bed participates in examination Eyes: Right  eye cloudy. Left pupil and iris appear normal ENT: Hard of hearing Cardiovascular regular rate and rhythm. No murmur, rub or gallop. No lower extremity edema. Respiratory: Clear to auscultation bilaterally. No wheezes, rales or rhonchi. Normal respiratory effort. Musculoskeletal: Was all extremities well. Grossly  normal tone and strength. Psychiatric: Difficult to assess. Oriented to self, not clearly oriented to location. Disoriented to month and year.  I have personally reviewed the following:   Labs:  Basic metabolic panel unremarkable   CBC stable, hemoglobin 10.9, platelets 65, WBC 3.5  CSF study noted, unremarkable. CSF culture no growth.   Repeat blood cultures no growth. Initial blood culture sent noted.  Imaging studies:  MRI brain no acute findings  CT head 2 reviewed   Most recent chest x-ray no acute disease   Scheduled Meds: . cephALEXin  500 mg Oral Q8H  . chlorhexidine  15 mL Mouth Rinse BID  . Chlorhexidine Gluconate Cloth  6 each Topical Daily  . ezetimibe  10 mg Oral Daily  . fluconazole  100 mg Oral Daily  . lactulose  40 g Oral TID  . LORazepam  1 mg Oral TID  . mouth rinse  15 mL Mouth Rinse q12n4p  . metoprolol tartrate  50 mg Oral QHS  . metoprolol tartrate  25 mg Oral Daily  . multivitamin with minerals  1 tablet Oral Daily  . nystatin   Topical TID  . pantoprazole  40 mg Oral Daily  . potassium chloride SA  20 mEq Oral BID  . rifaximin  550 mg Oral BID  . sodium chloride flush  10-40 mL Intracatheter Q12H   Continuous Infusions:   Principal Problem:   Acute encephalopathy Active Problems:   COPD (chronic obstructive pulmonary disease) (HCC)   HTN (hypertension)   Cirrhosis (HCC)   Thrombocytopenia (HCC)   Anxiety   Fall   Cerebral contusion ([place])   Subarachnoid hemorrhage ([place])   Bacteremia   LOS: 7 days

## 2016-12-29 NOTE — Clinical Social Work Placement (Signed)
   CLINICAL SOCIAL WORK PLACEMENT  NOTE  Date:  12/29/2016  Patient Details  Name: [name] [name] MRN: [id number] Date of Birth: 1961-05-17  Clinical Social Work is seeking post-discharge placement for this patient at the [place] level of care (*CSW will initial, date and re-position this form in  chart as items are completed):  Yes   Patient/family provided with [facility] Work Department's list of facilities offering this level of care within the geographic area requested by the patient (or if unable, by the patient's family).  Yes   Patient/family informed of their freedom to choose among providers that offer the needed level of care, that participate in Medicare, Medicaid or managed care program needed by the patient, have an available bed and are willing to accept the patient.  Yes   Patient/family informed of [facility]'s ownership interest in [facility] and [facility], as well as of the fact that they are under no obligation to receive care at these facilities.  PASRR submitted to EDS on 12/24/16     PASRR number received on       Existing PASRR number confirmed on       FL2 transmitted to all facilities in geographic area requested by pt/family on 12/24/16     FL2 transmitted to all facilities within larger geographic area on       Patient informed that his/her managed care company has contracts with or will negotiate with certain facilities, including the following:        Yes   Patient/family informed of bed offers received.  Patient chooses bed at [facility]     Physician recommends and patient chooses bed at      Patient to be transferred to [facility] on  .  Patient to be transferred to facility by       Patient family notified on   of transfer.  Name of family member notified:        PHYSICIAN Please sign FL2, Please sign DNR     Additional Comment:     _______________________________________________ Ihor Gully, LCSW 12/29/2016, 1:30 PM

## 2016-12-29 NOTE — Clinical Social Work Note (Signed)
LCSW spoke with [name] [name] at [facility] and was advised that patient was authorized. LCSW advised that patient would discharge tomorrow.      Delynn Pursley, Clydene Pugh, LCSW

## 2016-12-29 NOTE — Progress Notes (Signed)
Physical Therapy Treatment Patient Details Name: [name] [name] MRN: [id number] DOB: [birth year] Today's Date: 12/29/2016    History of Present Illness 56 y.o. male with h/o NASH cirrhosis and repeated episodes of hepatic encephalopathy due to medication noncompliance, DM, seizure disorder, not taking his meds and bipolar disorder, comes in today with increased confusion. Patient is quite somnolent at time of my evaluation and no family members are present. Per report given to EDP,  Family member stated that he was found on the floor shaking and improved after 20 minutes but never regained complete consciousness. Brought to the ED for evaluation. VSS, labs significant for chronic pancytopenia, CT head did show small subarachnoid hemorrhage, but this was unchanged on follow up CT, UA and CXR negative for evidence of infectious sources.  Dx: toxic metabolic encephalopathy.  Fall on 12/29/2016.    PT Comments    Pt received in room attempting to get out of the bed, and confused.  He just had a fall prior to PT's arrival, however cleared with RN for tx to assist with reduction of restlessness.  He was agreeable to PT tx, but expressed that he needed to go to the store.  He has poor attention throughout tx, and therefore requires constant hands on assistance.  He requires Mod A for supine<>sit and demonstrates significant lean to the left with L inattention.  Upon standing, he strangely demonstrates lean to the right instead.  He is very unsteady during short distance ambulation x 30f with RW and Mod A.  He requires cues for forward progression of the RW and maintained midline balance.  Pt was assisted back into the bed at end of tx and fidget apron was provided.  Pt is recommended for SNF.   Follow Up Recommendations  SNF     Equipment Recommendations  None recommended by PT    Recommendations for Other Services       Precautions / Restrictions Precautions Precautions: Fall Precaution Comments:  2 falls in the past 6 months.  Plus an additional fall that brought him into the hospital this time.   Pt had a fall today 12/29/2016 Restrictions Weight Bearing Restrictions: No    Mobility  Bed Mobility Overal bed mobility: Needs Assistance Bed Mobility: Supine to Sit     Supine to sit: Mod assist     General bed mobility comments: Continues to demonstrate significant lean to the left and L sided inattention.   Transfers Overall transfer level: Needs assistance Equipment used: Rolling walker (2 wheeled) Transfers: Sit to/from Stand Sit to Stand: Mod assist         General transfer comment: Upon standing pt strangely demonstrates lean to the right (inconsistent with sitting balance).    Ambulation/Gait Ambulation/Gait assistance: Mod assist Ambulation Distance (Feet): 20 Feet Assistive device: Rolling walker (2 wheeled) Gait Pattern/deviations: Step-to pattern;Shuffle;Trunk flexed   Gait velocity interpretation: <1.8 ft/sec, indicative of risk for recurrent falls General Gait Details: Pt has extremely pronated feet  with (+) out toeing position and difficulty keeping feet inside the base of the RW.  He requires assistance for forward progression and navigation of the RW.     Stairs            Wheelchair Mobility    Modified Rankin (Stroke Patients Only)       Balance Overall balance assessment: History of Falls;Needs assistance Sitting-balance support: Bilateral upper extremity supported;Feet supported Sitting balance-Leahy Scale: Poor     Standing balance support: Bilateral upper extremity supported Standing  balance-Leahy Scale: Poor                              Cognition Arousal/Alertness: Awake/alert Behavior During Therapy: Impulsive;Restless Overall Cognitive Status: Impaired/Different from baseline Area of Impairment: Orientation;Memory;Following commands;Safety/judgement                 Orientation Level: Disoriented  to;Place;Situation;Time     Following Commands: Follows one step commands inconsistently Safety/Judgement: Decreased awareness of deficits;Decreased awareness of safety     General Comments: Continues with slurred/thick speech       Exercises      General Comments        Pertinent Vitals/Pain      Home Living                      Prior Function            PT Goals (current goals can now be found in the care plan section) Acute Rehab PT Goals Patient Stated Goal: To go home.  PT Goal Formulation: All assessment and education complete, DC therapy Time For Goal Achievement: 01/06/17 Potential to Achieve Goals: Fair Progress towards PT goals: Not progressing toward goals - comment (progression towards goals has been limited by pt's confusion. )    Frequency    Min 3X/week      PT Plan Current plan remains appropriate    Co-evaluation              AM-PAC PT "6 Clicks" Daily Activity  Outcome Measure  Difficulty turning over in bed (including adjusting bedclothes, sheets and blankets)?: A Little Difficulty moving from lying on back to sitting on the side of the bed? : A Lot Difficulty sitting down on and standing up from a chair with arms (e.g., wheelchair, bedside commode, etc,.)?: A Little Help needed moving to and from a bed to chair (including a wheelchair)?: A Lot Help needed walking in hospital room?: A Lot Help needed climbing 3-5 steps with a railing? : A Lot 6 Click Score: 14    End of Session Equipment Utilized During Treatment: Gait belt Activity Tolerance: Patient limited by fatigue Patient left: in bed;with call bell/phone within reach;with bed alarm set;Other (comment) (fidget apron provided) Nurse Communication: Mobility status Lonn [name], RN notified of pt's mobility.) PT Visit Diagnosis: Other abnormalities of gait and mobility (R26.89);Muscle weakness (generalized) (M62.81);History of falling (Z91.81);Other symptoms and signs  involving the nervous system (R29.898)     Time: 1100-1130 PT Time Calculation (min) (ACUTE ONLY): 30 min  Charges:  $Gait Training: 8-22 mins $Therapeutic Activity: 8-22 mins                    G Codes:       Beth Cid [employer], PT, DPT X: [phone]

## 2016-12-29 NOTE — Clinical Social Work Note (Signed)
LCSW was able to reach spouse and updated phone number in chart.  Mrs. Vivier chooses [facility].      Perian Tedder, Clydene Pugh, LCSW

## 2016-12-30 MED ORDER — LORAZEPAM 0.5 MG PO TABS: 0.5000 mg | ORAL_TABLET | Freq: Three times a day (TID) | ORAL | Status: DC | PRN

## 2016-12-30 MED ORDER — QUETIAPINE FUMARATE 25 MG PO TABS
25.0000 mg | ORAL_TABLET | Freq: Two times a day (BID) | ORAL | Status: DC
  Administered 2016-12-30 – 2016-12-31 (×3): 25 mg via ORAL
  Filled 2016-12-30 (×3): qty 1

## 2016-12-30 NOTE — Care Management Note (Signed)
Case Management Note  Patient Details  Name: [name] [name] MRN: [id number] Date of Birth: 1961-05-16  If discussed at Long Length of Stay Meetings, dates discussed:  12/30/2016   Sherald Barge, RN 12/30/2016, 10:21 AM

## 2016-12-30 NOTE — Progress Notes (Addendum)
LCSW continues to follow for disposition and new SNF placement.  Call placed to insurance company:  [phone]  (case manager for patient  [facility] [place]) Message left in reference of patient and current status. LCSW has faxed information to [facility] to update insurance authorization so dates can reflect admission this weekend if able.  Facility is aware and also in agreement with plan.  Patient's plans are to discharge to [facility] however he continues to attempt to get up and be confused requiring him to have a sitter for redirection.  Sitter must be removed for 24 hours prior to patient going to facility.  Team continues to address issues and assist with controlling impulsive behaviors.  No other needs at this time.  Patient fell per nursing on 5/9.  Holding discharge until sitter can be removed.  Lane Hacker, MSW Clinical Social Work: [employer] Coverage for :  [phone]

## 2016-12-30 NOTE — Progress Notes (Signed)
Physical Therapy Treatment Patient Details Name: [name] [name] MRN: [id number] DOB: 01-02-1961 Today's Date: 12/30/2016    History of Present Illness 56 y.o. male with h/o NASH cirrhosis and repeated episodes of hepatic encephalopathy due to medication noncompliance, DM, seizure disorder, not taking his meds and bipolar disorder, comes in today with increased confusion. Patient is quite somnolent at time of my evaluation and no family members are present. Per report given to EDP,  Family member stated that he was found on the floor shaking and improved after 20 minutes but never regained complete consciousness. Brought to the ED for evaluation. VSS, labs significant for chronic pancytopenia, CT head did show small subarachnoid hemorrhage, but this was unchanged on follow up CT, UA and CXR negative for evidence of infectious sources.  Dx: toxic metabolic encephalopathy.  Fall on 12/29/2016.    PT Comments    Pt received in bed, pt is restless and wants to mobilize.  Pt continues to require Mod A for supine<>sit due to consistent posterior lean.  He only requires Min A for sit<>Stand with RW.  He was able to significantly increase gait distance to 262f with RW and Mod A with w/c follow.  He demonstrates significant lean to the right and posterior at times, and multimodal cues used for directional changes.  He continues to demonstrate some neglect/inattention to the L sided, and follows commands best when presented in midline.  Continue to recommend SNF.    Follow Up Recommendations  SNF     Equipment Recommendations  None recommended by PT    Recommendations for Other Services       Precautions / Restrictions Precautions Precautions: Fall Precaution Comments: 2 falls in the past 6 months.  Plus an additional fall that brought him into the hospital this time.   Pt had a fall today 12/29/2016 Restrictions Weight Bearing Restrictions: No    Mobility  Bed Mobility Overal bed mobility: Needs  Assistance Bed Mobility: Supine to Sit     Supine to sit: Mod assist     General bed mobility comments: Continues to demonstrates strong posterior LOB while sitting on the EOB, as well as rotation to the right.    Transfers Overall transfer level: Needs assistance Equipment used: Rolling walker (2 wheeled) Transfers: Sit to/from Stand Sit to Stand: Min assist         General transfer comment: Pulling up on the RW with PT stabilizing RW.   Ambulation/Gait Ambulation/Gait assistance: Mod assist;Max assist Ambulation Distance (Feet): 200 Feet Assistive device: Rolling walker (2 wheeled)       General Gait Details: Pt has extremely pronated feet  with (+) out toeing position with R foot essentially turned 90* outward, and difficulty keeping feet inside the base of the RW.  He requires assistance for forward progression and navigation of the RW.  He requires assistance due to significant lean to the right.  He required multiple short standing rest breaks.  W/C follow due to unsteadiness.  Pt required midline visual cues, and auditory cues to change direction, and constant cues to focus on task instead of looking into other pt's rooms.    Stairs            Wheelchair Mobility    Modified Rankin (Stroke Patients Only)       Balance Overall balance assessment: History of Falls;Needs assistance Sitting-balance support: Bilateral upper extremity supported;Feet supported       Standing balance support: Bilateral upper extremity supported Standing balance-Leahy Scale: Poor  Standing balance comment: Lean to the right                            Cognition Arousal/Alertness: Awake/alert Behavior During Therapy: Restless;Impulsive                           Following Commands: Follows one step commands inconsistently Safety/Judgement: Decreased awareness of safety;Decreased awareness of deficits     General Comments: Pt follows midline commands,  however he continues to demonstrate L sided in neglect/attention.  Pt continues to have dysarthric speech, and at times PT is not able to understand what he is saying.       Exercises      General Comments        Pertinent Vitals/Pain Pain Assessment: No/denies pain    Home Living                      Prior Function            PT Goals (current goals can now be found in the care plan section) Acute Rehab PT Goals Patient Stated Goal: To go home.  Time For Goal Achievement: 01/06/17 Potential to Achieve Goals: Fair Progress towards PT goals: Progressing toward goals    Frequency    Min 3X/week      PT Plan Current plan remains appropriate    Co-evaluation              AM-PAC PT "6 Clicks" Daily Activity  Outcome Measure  Difficulty turning over in bed (including adjusting bedclothes, sheets and blankets)?: A Little Difficulty moving from lying on back to sitting on the side of the bed? : A Lot Difficulty sitting down on and standing up from a chair with arms (e.g., wheelchair, bedside commode, etc,.)?: A Little Help needed moving to and from a bed to chair (including a wheelchair)?: A Lot Help needed walking in hospital room?: A Lot Help needed climbing 3-5 steps with a railing? : A Lot 6 Click Score: 14    End of Session Equipment Utilized During Treatment: Gait belt Activity Tolerance: Patient limited by fatigue Patient left: in bed;with call bell/phone within reach;with nursing/sitter in room Nurse Communication: Mobility status PT Visit Diagnosis: Other abnormalities of gait and mobility (R26.89);Muscle weakness (generalized) (M62.81);History of falling (Z91.81);Other symptoms and signs involving the nervous system (R29.898)     Time: 4098-1191 PT Time Calculation (min) (ACUTE ONLY): 23 min  Charges:  $Gait Training: 23-37 mins                    G Codes:       Beth Laiken Nohr, PT, DPT X: [phone]

## 2016-12-30 NOTE — Progress Notes (Signed)
PROGRESS NOTE  [name]:[id number] DOB: May 31, 1961 DOA: 12/21/2016 PCP: Hermine Messick, MD  Brief Narrative: 56 year old man PMH cirrhosis, multiple episodes of hepatic encephalopathy secondary to noncompliance, seizure disorder, recent hospitalization for hepatic encephalopathy; presented to the emergency department after a fall at home. Admitted for acute encephalopathy, cerebral contusion (per neurosurgery no treatment recommended). He remained confused and encephalopathic, developed fever and was found to have bacteremia. Lethargy resolved with antibiotics but the patient has remained confused, agitated and had a fall 5/8 necessitating a safety sitter be reinstituted.  Assessment/Plan 1. Acute encephalopathy. After further discussion with wife, it does appear the patient's probably at baseline at this point. Extensive investigation including MRI of the brain, ABG, lumbar puncture and other laboratory tests were unrevealing. No evidence of infection or acute CNS process. See discussion below. 2. Small subarachnoid hemorrhage, traumatic. Stable on CT. No further evaluation suggested. 3. Hyperammonemia, resolved with lactulose. 4. Multiple falls at home per chart. According to chart wife was concerned the patient was taking too much Xanax. No falls last 24 hours. 5. Dysarthria. Long-standing per discussion with wife, at baseline. MRI brain negative. 6. Streptococcus viridans bacteremia, per infectious disease treat with 10 days of oral antibiotics. Appears stable. Continue antibiotics. 7. NASH cirrhosis with multiple episodes of hepatic encephalopathy, associated with thrombocytopenia. 8. Seizure disorder 9. PMH bipolar 1 disorder, mental retardation 10. COPD 73. Obesity    Discussed in detail with wife at bedside. She reports for the last 2 years his speech is dysarthric, he is very impulsive and unless he is sleeping he is constantly restless and moving about the house. She  occasionally takes him outside but notes he is a significant fall risk and falls often at home. He is confused at baseline.  Main issue at this point is safety, the patient did fall yesterday. He is constantly trying to get up today. Discussed medications with wife. Recommend starting Seroquel at this point, she is not aware of any previous problems with this medication. We discussed the risk of cardiac arrhythmias and sudden death with the steps of medications and she accepts the risk. It is felt that the benefit of the medication far outweighs the risk.  Once the patient is more calm and no longer so impulsive, discontinue sitter, then transfer skilled nursing facility  DVT prophylaxis: SCDs Code Status: DNR Family Communication: As above Disposition Plan: SNF    [name], MD  [facility] Direct contact: [phone] --Via amion app OR  --[email or web address]; password [email or web address]  7PM-7AM contact night coverage as above 12/30/2016, 12:58 PM  LOS: 8 days   Consultants:  Neurology  Procedures:  5/4: Lumbar puncture  Antimicrobials:  Vancomycin 5/2>>5/5  Meropenem 5/2>>5/5  Ceftriaxone 5/5>>5/8  keflex 5/8>>   Interval history/Subjective: Cigarette bedside. Patient continually trying to get out of bed. Wife at bedside reports patient is at baseline.  Objective: Vitals:   12/29/16 1430 12/29/16 1450 12/29/16 2240 12/30/16 0556  BP:  (!) 148/74 (!) 159/78 (!) 141/82  Pulse:  64 69 64  Resp:  18 20 18   Temp:  98.4 F (36.9 C) 98.8 F (37.1 C) 98.6 F (37 C)  TempSrc:  Axillary Oral Oral  SpO2:  100% 97% 99%  Weight: 101.6 kg (223 lb 15.8 oz)   98.2 kg (216 lb 7.9 oz)  Height:        Intake/Output Summary (Last 24 hours) at 12/30/16 1258 Last data filed at 12/30/16 0559  Gross per 24 hour  Intake  120 ml  Output              800 ml  Net             -680 ml     Filed Weights   12/26/16 0500 12/29/16 1430 12/30/16 0556  Weight: 104.3 kg (229 lb  15 oz) 101.6 kg (223 lb 15.8 oz) 98.2 kg (216 lb 7.9 oz)    Exam:    Constitutional: Confused, trying to get out, got out of bed. Speech difficult to understand. Cardiovascular: Regular rate and rhythm. No murmur, rub or gallop. No lower extremity edema. Respiratory: Clear to auscultation bilaterally. No wheezes, rales or rhonchi. Normal respiratory effort. Psychiatric: Confused. Speech is very dysarthric. Musculoskeletal: Moves all extremities spontaneously.  I have personally reviewed the following:   Labs:    Imaging studies:     Scheduled Meds: . cephALEXin  500 mg Oral Q8H  . chlorhexidine  15 mL Mouth Rinse BID  . Chlorhexidine Gluconate Cloth  6 each Topical Daily  . ezetimibe  10 mg Oral Daily  . fluconazole  100 mg Oral Daily  . lactulose  40 g Oral TID  . LORazepam  1 mg Oral TID  . mouth rinse  15 mL Mouth Rinse q12n4p  . metoprolol tartrate  50 mg Oral QHS  . metoprolol tartrate  25 mg Oral Daily  . multivitamin with minerals  1 tablet Oral Daily  . nystatin   Topical TID  . pantoprazole  40 mg Oral Daily  . potassium chloride SA  20 mEq Oral BID  . QUEtiapine  25 mg Oral BID  . rifaximin  550 mg Oral BID  . sodium chloride flush  10-40 mL Intracatheter Q12H   Continuous Infusions:   Principal Problem:   Acute encephalopathy Active Problems:   COPD (chronic obstructive pulmonary disease) (HCC)   HTN (hypertension)   Cirrhosis (HCC)   Thrombocytopenia (HCC)   Anxiety   Fall   Cerebral contusion ([place])   Subarachnoid hemorrhage ([place])   Bacteremia   LOS: 8 days

## 2016-12-31 DIAGNOSIS — F0391 Unspecified dementia with behavioral disturbance: Secondary | ICD-10-CM

## 2016-12-31 MED ORDER — QUETIAPINE FUMARATE 25 MG PO TABS: 25.0000 mg | ORAL_TABLET | Freq: Every day | ORAL | Status: DC

## 2016-12-31 MED ORDER — RIFAXIMIN 550 MG PO TABS: 550.0000 mg | ORAL_TABLET | Freq: Two times a day (BID) | ORAL | Status: DC

## 2016-12-31 MED ORDER — METOPROLOL TARTRATE 25 MG PO TABS: 25.0000 mg | ORAL_TABLET | Freq: Two times a day (BID) | ORAL | Status: DC

## 2016-12-31 MED ORDER — LORAZEPAM 0.5 MG PO TABS
0.5000 mg | ORAL_TABLET | Freq: Two times a day (BID) | ORAL | Status: DC
  Administered 2016-12-31 – 2017-01-01 (×3): 0.5 mg via ORAL
  Filled 2016-12-31 (×3): qty 1

## 2016-12-31 NOTE — Discharge Summary (Addendum)
Physician Discharge Summary  [name] [name] QMV:[id number] DOB: [birth year] DOA: 12/21/2016  PCP: Hermine Messick, MD  Admit date: 12/21/2016 Discharge date: 01/01/2017  Recommendations for Outpatient Follow-up:  1. Short-term rehabilitation at skilled nursing facility 2. Consider outpatient evaluation for suspected dementia 3. High fall risk, consider weaning benzodiazepine as an outpatient under the supervision of physician complete antibiotics for bacteremia 4. Consider outpatient follow-up with neurology, will defer to primary care physician 5. Started on quetiapine this hospitalization, adjust as needed     Contact information for follow-up providers    Hermine Messick, MD. Schedule an appointment as soon as possible for a visit in 2 week(s).   Specialty:  Family Medicine Contact information: [address] [place] [address] [phone]        Phillips Odor, MD Follow up in 1 month(s).   Specialty:  Neurology Contact information: [address] [place] [address] [phone]            Contact information for after-discharge care    Destination    [facility] Follow up.   Specialty:  Skilled Nursing Facility Contact information: [place] [phone]                  Filed Weights   12/26/16 0500 12/29/16 1430 12/30/16 0556  Weight: 104.3 kg (229 lb 15 oz) 101.6 kg (223 lb 15.8 oz) 98.2 kg (216 lb 7.9 oz)     Discharge Diagnoses:  1. Acute encephalopathy 2. Presumed dementia, chronic encephalopathy 3. Small traumatic subarachnoid hemorrhage 4. Streptococcus viridans bacteremia 5. Hyperammonemia 6. Multiple falls at home 7. Chronic dysarthria 8. 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 Obesity  Discharge Condition: Improved Disposition: Skilled nursing facility  Diet recommendation: heart healthy  Filed Weights   12/26/16 0500 12/29/16 1430  12/30/16 0556  Weight: 104.3 kg (229 lb 15 oz) 101.6 kg (223 lb 15.8 oz) 98.2 kg (216 lb 7.9 oz)    History of present illness:  57 year old man PMH cirrhosis, multiple episodes of hepatic encephalopathy secondary to noncompliance, seizure disorder, recent hospitalization for hepatic encephalopathy; presented to the emergency department after a fall at home. Admitted for acute encephalopathy, cerebral contusion (per neurosurgery no treatment recommended)  Hospital Course:  Follow-up head CT the following day demonstrated a stable, small subarachnoid hemorrhage. This was noncontributory. Patient remained encephalopathic and confused, developed fever and was found to have bacteremia. He underwent extensive investigation including lumbar puncture which was negative for infection. Source of bacteremia was unclear, however case was discussed with infectious disease who recommended 10 days of Keflex. The patient has remained afebrile. Other issues included hyperammonemia which resolved with treatment with lactulose. Further discussion with wife revealed the patient to be near baseline: Per her description the patient has been quite confused for the last 2 years, severely dysarthric for the last 2 years, impulsive and a high fall risk. At home he falls frequently. Unless he is sleeping he is very active. After further discussion with her at the bedside, it became clear that the patient was very close to his baseline. Wife also suspects dementia. Physical therapy recommended skilled nursing facility for rehabilitation. Hospitalization was prolonged by high fall risk and the patient did sustain a fall during hospitalization without apparent sequela. However at this point the facility has waived the 24 hour sitter requirement and therefore the patient will be discharged today to begin rehabilitation as he appears to have received maximum medical benefit.  #  1: Acute encephalopathy superimposed on presumed dementia.    -Discussed in detail with wife. For the last 2 years the patient has had dysarthria, confusion and is a high fall risk. He mostly spends time in the house. Based on our discussion, the patient does appear to be very close to baseline. -He remains a significant fall risk. Seroquel has been started and the risk/benefit was discussed with wife which she accepted.   #2: Small subarachnoid hemorrhage, traumatic.  -Stable on CT. No further evaluation suggested.  #3: Hyperammonemia, secondary to cirrhosis, resolved with lactulose.  #4: Multiple falls at home per chart. According to chart wife was concerned the patient was taking too much Xanax. No falls last 72 hours. However impulsive and remains a fall risk. -Given significant Xanax use, will schedule low-dose benzodiazepine for now. Recommend weaning as an outpatient.  #5: Dysarthria. At baseline. Long-standing per wife. MRI was unremarkable.  #6: Streptococcus viridans bacteremia, per infectious disease treat with 10 days of oral antibiotics.  -Remains stable. Continue Keflex.  #7: NASH cirrhosis with multiple episodes of hepatic encephalopathy, associated with thrombocytopenia. Appears to be stable.  #8:Seizure disorder? Stable.   #9: PMH bipolar 1 disorder, mental retardation #10: COPD, appears stable. #11: Obesity   Consultants:  Neurology  Procedures:  5/4: Lumbar puncture  Antimicrobials:  Vancomycin 5/2>>5/5  Meropenem 5/2>>5/5  Ceftriaxone 5/5>>5/8  Keflex 5/8>>5/17   Today's assessment: S: Per sitter at bedside, the patient was awake from 11 PM to 5 PM. He has been sleeping this morning. O: Vitals: Temperature 90.8, respirations 20, pulse 72, blood pressure 100/54, SPO2 93%.  Appears calm, comfortable. Cardiovascular: Regular rate and rhythm. No murmur, rub or gallop. No lower extremity edema. Respiratory: Clear to auscultation bilaterally. No wheezes, rales or rhonchi. Normal respiratory  effort. Psychiatric: Wakes up easily. Speech unchanged. Follows simple commands. Alert and interactive.  Discharge Instructions   Allergies as of 01/01/2017      Reactions   Penicillins Rash   Has patient had a PCN reaction causing immediate rash, facial/tongue/throat swelling, SOB or lightheadedness with hypotension: Yes Has patient had a PCN reaction causing severe rash involving mucus membranes or skin necrosis: No Has patient had a PCN reaction that required hospitalization No Has patient had a PCN reaction occurring within the last 10 years: No If all of the above answers are "NO", then may proceed with Cephalosporin use.      Medication List    STOP taking these medications   aspirin EC 81 MG tablet     TAKE these medications   ALPRAZolam 0.5 MG tablet Commonly known as:  XANAX Take 0.5 mg by mouth 3 (three) times daily as needed for anxiety.   ezetimibe 10 MG tablet Commonly known as:  ZETIA Take 10 mg by mouth daily.   gabapentin 300 MG capsule Commonly known as:  NEURONTIN Take 300 mg by mouth 3 (three) times daily.   lactulose 10 GM/15ML solution Commonly known as:  CHRONULAC Take 60 mLs (40 g total) by mouth 3 (three) times daily. Titrate for 3-4 bowel motions daily.   metoprolol tartrate 25 MG tablet Commonly known as:  LOPRESSOR Take 1 tablet (25 mg total) by mouth 2 (two) times daily. What changed:  when to take this  additional instructions   multivitamin with minerals Tabs tablet Take 1 tablet by mouth daily.   omeprazole 20 MG capsule Commonly known as:  PRILOSEC Take 20 mg by mouth daily.   potassium chloride SA 20 MEQ tablet  Commonly known as:  K-DUR,KLOR-CON Take 20 mEq by mouth 2 (two) times daily.   QUEtiapine 25 MG tablet Commonly known as:  SEROQUEL Take 1 tablet (25 mg total) by mouth at bedtime.   rifaximin 550 MG Tabs tablet Commonly known as:  XIFAXAN Take 1 tablet (550 mg total) by mouth 2 (two) times daily.       Allergies  Allergen Reactions  . Penicillins Rash    Has patient had a PCN reaction causing immediate rash, facial/tongue/throat swelling, SOB or lightheadedness with hypotension: Yes Has patient had a PCN reaction causing severe rash involving mucus membranes or skin necrosis: No Has patient had a PCN reaction that required hospitalization No Has patient had a PCN reaction occurring within the last 10 years: No If all of the above answers are "NO", then may proceed with Cephalosporin use.     The results of significant diagnostics from this hospitalization (including imaging, microbiology, ancillary and laboratory) are listed below for reference.    Significant Diagnostic Studies: Ct Head Wo Contrast  Addendum Date: 12/22/2016   ADDENDUM REPORT: 12/22/2016 11:24 ADDENDUM: Study discussed by telephone with Dr. Jolaine Artist MEMON on 12/22/2016 at 1117 hours. Electronically Signed   By: Genevie Ann M.D.   On: 12/22/2016 11:24   Result Date: 12/22/2016 CLINICAL DATA:  57 year old male with worsening altered mental status. [place] out of bed yesterday striking the right side. Slurred speech. EXAM: CT HEAD WITHOUT CONTRAST TECHNIQUE: Contiguous axial images were obtained from the base of the skull through the vertex without intravenous contrast. COMPARISON:  Head CT 12/21/2016 and earlier. FINDINGS: Brain: Trace hyperdensity along the left aspect of the interhemispheric fissure and medial left frontal lobe on series 2, image 24 is stable and is most compatible with trace subarachnoid hemorrhage. A trace amount of parafalcine subarachnoid is also visible on the opposite side (series 2, images 22 and 23). No associated mass effect or cerebral edema. No other acute intracranial hemorrhage identified. No intraventricular blood. No midline shift, ventriculomegaly, mass effect, evidence of mass lesion, or cortically based acute infarction. Gray-white matter differentiation is within normal limits throughout the brain.  Vascular: Calcified atherosclerosis at the skull base. Skull: Stable and intact.  No acute osseous abnormality identified. Sinuses/Orbits: Previous mastoidectomies appears stable. Stable paranasal sinus pneumatization, anterior left ethmoid mucosal thickening and opacification. Other: Right phthisis bulbi. Left orbits soft tissues are stable and intact. Mild broad-based posterior convexity scalp hematoma has regressed (series 3, image 30). No other scalp hematoma or acute superficial soft tissue injury identified. IMPRESSION: 1. Small parafalcine density along the medial frontal lobes is stable an most compatible with a trace amount of posttraumatic Subarachnoid Hemorrhage. 2. No associated mass effect and no other acute intracranial abnormality. 3. Regressed mild posterior convexity scalp hematoma. No skull fracture identified. Electronically Signed: By: Genevie Ann M.D. On: 12/22/2016 11:05   Ct Head Wo Contrast  Result Date: 12/21/2016 CLINICAL DATA:  Fall from bed this morning EXAM: CT HEAD WITHOUT CONTRAST TECHNIQUE: Contiguous axial images were obtained from the base of the skull through the vertex without intravenous contrast. COMPARISON:  12/13/2016 FINDINGS: Brain: Focal area of increased attenuation is noted along the medial aspect of the left frontoparietal region just above the corpus callosum. This likely represents a small area of contusion. No subarachnoid hemorrhage or extra-axial hemorrhage is seen. Mild atrophic changes are noted. Vascular: No hyperdense vessel or unexpected calcification. Skull: Changes of prior mastoidectomies bilaterally. Sinuses/Orbits: Chronic changes are noted in the orbits bilaterally. Other:  Mild soft tissue swelling is noted near the vertex posteriorly consistent with the recent injury. IMPRESSION: Scalp hematoma consistent with the recent injury. Area of increased attenuation along the medial aspect of the left cerebral hemisphere adjacent to the falx best seen on image  number 24 series 2. This is consistent with a mild area of parenchymal contusion. Short-term follow-up examination is recommended. Electronically Signed   By: Inez Catalina M.D.   On: 12/21/2016 13:20   Mr Brain Wo Contrast  Result Date: 12/27/2016 CLINICAL DATA:  Fall 3 days ago.  Possible subarachnoid hematoma. EXAM: MRI HEAD WITHOUT CONTRAST TECHNIQUE: Multiplanar, multiecho pulse sequences of the brain and surrounding structures were obtained without intravenous contrast. COMPARISON:  Head CT 12/22/2016, 12/21/2016 FINDINGS: Despite efforts by the technologist and patient, motion artifact is present on today's examination and could not be eliminated. The findings of this study are interpreted in the context of that limitation. Brain: The midline structures are normal. No focal diffusion restriction to indicate acute infarct. No intraparenchymal hemorrhage. Mild hyperintense T2 weighted signal within the periventricular white matter adjacent to the left lateral ventricle occipital horn. No mass lesion. Susceptibility weighted imaging is severely motion degraded. No hydrocephalus, age advanced atrophy or lobar predominant volume loss. No dural abnormality or extra-axial collection. Vascular: Major intracranial arterial and venous sinus flow voids are preserved. Skull and upper cervical spine: The visualized skull base, calvarium, upper cervical spine and extracranial soft tissues are normal. Sinuses/Orbits: No fluid levels or advanced mucosal thickening. No mastoid effusion. Normal orbits. IMPRESSION: 1. Motion degraded examination, particularly of the susceptibility weighted images. 2. Within the above limitation, no visible acute intracranial abnormality. Electronically Signed   By: Ulyses Jarred M.D.   On: 12/27/2016 16:37   Dg Chest Port 1 View  Result Date: 12/22/2016 CLINICAL DATA:  New onset of fever last night. History of DM, HTN, COPD, GERD, Cirrhosis, seizures. Pt would not wake during exam. EXAM:  PORTABLE CHEST 1 VIEW COMPARISON:  12/21/2016 FINDINGS: Mild enlargement of the cardiopericardial silhouette. No mediastinal or hilar masses. No evidence of adenopathy. Lungs are clear.  No pleural effusion.  No pneumothorax. Skeletal structures are grossly intact. IMPRESSION: No acute cardiopulmonary disease. Electronically Signed   By: Lajean Manes M.D.   On: 12/22/2016 19:24   Dg Chest Portable 1 View  Result Date: 12/21/2016 CLINICAL DATA:  Status post fall from bed this morning. History of COPD, diabetes, current smoker. EXAM: PORTABLE CHEST 1 VIEW COMPARISON:  Chest x-ray of December 13, 2016 FINDINGS: The lungs are adequately inflated. The interstitial markings are mildly prominent bilaterally and more conspicuous today. The cardiac silhouette is enlarged and the pulmonary vascularity is more engorged today. There is calcification in the wall of the aortic arch. The mediastinum is normal in width. There is no pleural effusion. The bony thorax exhibits no acute abnormality. IMPRESSION: COPD with superimposed mild CHF. No focal pneumonia. The observed bony structures are unremarkable. Electronically Signed   By: David  Martinique M.D.   On: 12/21/2016 12:29   Dg Fluoro Guide Lumbar Puncture  Result Date: 12/24/2016 CLINICAL DATA:  Altered mental status EXAM: DIAGNOSTIC LUMBAR PUNCTURE UNDER FLUOROSCOPIC GUIDANCE FLUOROSCOPY TIME:  Fluoroscopy Time:  0 minutes 6 seconds Radiation Exposure Index (if provided by the fluoroscopic device): 2.2 mGy Number of Acquired Spot Images: Single screen capture during fluoroscopy PROCEDURE: Written informed consent was obtained from the patient's spouse. Timeout protocol followed. Patient placed prone. L4-L5 disc space was localized under fluoroscopy. Skin prepped and draped  in usual sterile fashion. Skin and soft tissues anesthetized with 2 ml of 1% lidocaine. 22 gauge needle was advanced into the spinal canal where CSF was encounter. Question CSF slightly xanthochromic. 9.5  mL of CSF was obtained in 4 tubes for requested analysis. Procedure tolerated very well by patient without immediate complication. IMPRESSION: Successful fluoroscopic guided lumbar puncture. Electronically Signed   By: Lavonia Dana M.D.   On: 12/24/2016 15:33    Microbiology: Recent Results (from the past 240 hour(s))  Culture, blood (routine x 2)     Status: None   Collection Time: 12/22/16  5:31 PM  Result Value Ref Range Status   Specimen Description BLOOD LEFT HAND  Final   Special Requests   Final    BOTTLES DRAWN AEROBIC ONLY Blood Culture adequate volume   Culture NO GROWTH 5 DAYS  Final   Report Status 12/27/2016 FINAL  Final  Culture, blood (routine x 2)     Status: Abnormal   Collection Time: 12/22/16  5:40 PM  Result Value Ref Range Status   Specimen Description LEFT ANTECUBITAL  Final   Special Requests   Final    BOTTLES DRAWN AEROBIC AND ANAEROBIC Blood Culture adequate volume   Culture  Setup Time   Final    GRAM POSITIVE COCCI Gram Stain Report Called to,Read Back By and Verified With: HOWARD C AT 1250 ON [id number] BY THOMPSON S. AEROBIC BOTTLE ONLY    Culture (A)  Final    VIRIDANS STREPTOCOCCUS THE SIGNIFICANCE OF ISOLATING THIS ORGANISM FROM A SINGLE SET OF BLOOD CULTURES WHEN MULTIPLE SETS ARE DRAWN IS UNCERTAIN. PLEASE NOTIFY THE MICROBIOLOGY DEPARTMENT WITHIN ONE WEEK IF SPECIATION AND SENSITIVITIES ARE REQUIRED. Performed at [facility], [place] [address], [place], [place] [id number]    Report Status 12/25/2016 FINAL  Final  Blood Culture ID Panel (Reflexed)     Status: Abnormal   Collection Time: 12/22/16  5:40 PM  Result Value Ref Range Status   Enterococcus species NOT DETECTED NOT DETECTED Final   Listeria monocytogenes NOT DETECTED NOT DETECTED Final   Staphylococcus species NOT DETECTED NOT DETECTED Final   Staphylococcus aureus NOT DETECTED NOT DETECTED Final   Streptococcus species DETECTED (A) NOT DETECTED Final    Comment: Not Enterococcus  species, Streptococcus agalactiae, Streptococcus pyogenes, or Streptococcus pneumoniae. CRITICAL RESULT CALLED TO, READ BACK BY AND VERIFIED WITH: Tana Conch RN 17:30 12/23/16 ([email or web address])    Streptococcus agalactiae NOT DETECTED NOT DETECTED Final   Streptococcus pneumoniae NOT DETECTED NOT DETECTED Final   Streptococcus pyogenes NOT DETECTED NOT DETECTED Final   Acinetobacter baumannii NOT DETECTED NOT DETECTED Final   Enterobacteriaceae species NOT DETECTED NOT DETECTED Final   Enterobacter cloacae complex NOT DETECTED NOT DETECTED Final   Escherichia coli NOT DETECTED NOT DETECTED Final   Klebsiella oxytoca NOT DETECTED NOT DETECTED Final   Klebsiella pneumoniae NOT DETECTED NOT DETECTED Final   Proteus species NOT DETECTED NOT DETECTED Final   Serratia marcescens NOT DETECTED NOT DETECTED Final   Haemophilus influenzae NOT DETECTED NOT DETECTED Final   Neisseria meningitidis NOT DETECTED NOT DETECTED Final   Pseudomonas aeruginosa NOT DETECTED NOT DETECTED Final   Candida albicans NOT DETECTED NOT DETECTED Final   Candida glabrata NOT DETECTED NOT DETECTED Final   Candida krusei NOT DETECTED NOT DETECTED Final   Candida parapsilosis NOT DETECTED NOT DETECTED Final   Candida tropicalis NOT DETECTED NOT DETECTED Final    Comment: Performed at [facility], [place]  [address], [place], [place] [address]  Culture, blood (routine x 2)     Status: None   Collection Time: 12/23/16  8:09 PM  Result Value Ref Range Status   Specimen Description BLOOD PICC LINE  Final   Special Requests   Final    BOTTLES DRAWN AEROBIC AND ANAEROBIC Blood Culture results may not be optimal due to an inadequate volume of blood received in culture bottles   Culture NO GROWTH 5 DAYS  Final   Report Status 12/28/2016 FINAL  Final  Culture, blood (routine x 2)     Status: None   Collection Time: 12/23/16  8:10 PM  Result Value Ref Range Status   Specimen Description BLOOD LEFT ANTECUBITAL  Final   Special  Requests   Final    BOTTLES DRAWN AEROBIC AND ANAEROBIC Blood Culture adequate volume   Culture NO GROWTH 5 DAYS  Final   Report Status 12/28/2016 FINAL  Final  CSF culture     Status: None   Collection Time: 12/24/16  3:08 PM  Result Value Ref Range Status   Specimen Description CSF  Final   Special Requests NONE  Final   Gram Stain   Final    NO ORGANISMS SEEN WBC PRESENT, PREDOMINANTLY PMN Gram Stain Report Called to,Read Back By and Verified With: GAMMONS S. AT 8563 ON [id number] BY THOMPSON S.    Culture   Final    NO GROWTH 3 DAYS Performed at [facility], [place] [address], [place], [place] [address]    Report Status 12/27/2016 FINAL  Final     Labs: Basic Metabolic Panel:  Recent Labs Lab 12/26/16 0520 12/28/16 0440 12/29/16 0542  NA 139 139 139  K 3.5 3.7 4.0  CL 107 104 104  CO2 29 30 29   GLUCOSE 98 97 98  BUN 8 <5* <5*  CREATININE 0.55* 0.51* 0.57*  CALCIUM 7.9* 8.3* 8.4*    Recent Labs Lab 12/26/16 0520 12/28/16 0440  AMMONIA 40* 22   CBC:  Recent Labs Lab 12/26/16 0520 12/28/16 0440 12/29/16 0542  WBC 3.4* 3.6* 3.5*  HGB 10.4* 10.3* 10.9*  HCT 31.8* 31.6* 33.8*  MCV 97.0 96.3 97.4  PLT 67* 63* 65*    Principal Problem:   Acute encephalopathy Active Problems:   COPD (chronic obstructive pulmonary disease) (HCC)   HTN (hypertension)   Cirrhosis (HCC)   Thrombocytopenia (HCC)   Anxiety   Fall   Cerebral contusion ([place])   Subarachnoid hemorrhage ([place])   Bacteremia   Time coordinating discharge: 35 minutes  Signed:  [name], MD [facility] 01/01/2017, 10:17 AM

## 2016-12-31 NOTE — Progress Notes (Signed)
PROGRESS NOTE  [name] [name] YQI:[id number] DOB: 1961/06/27 DOA: 12/21/2016 PCP: Hermine Messick, MD  Brief Narrative: 56 year old man PMH cirrhosis, multiple episodes of hepatic encephalopathy secondary to noncompliance, seizure disorder, recent hospitalization for hepatic encephalopathy; presented to the emergency department after a fall at home. Admitted for acute encephalopathy, cerebral contusion (per neurosurgery no treatment recommended). He remained confused and encephalopathic, developed fever and was found to have bacteremia. Lethargy resolved with antibiotics but the patient has remained confused, agitated and had a fall 5/8 necessitating a safety sitter be reinstituted.  Assessment/Plan #1: Acute encephalopathy superimposed on presumed dementia.  -Discussed in detail with wife. For the last 2 years the patient has had dysarthria, confusion and is a high fall risk. He mostly spends time in the house. Based on our discussion, the patient does appear to be very close to baseline. -He remains a significant fall risk and currently requires a sitter. Seroquel has been started and the risk/benefit was discussed with wife which she accepted.   #2: Small subarachnoid hemorrhage, traumatic.  -Stable on CT. No further evaluation suggested.  #3: Hyperammonemia, resolved with lactulose.  #4: Multiple falls at home per chart. According to chart wife was concerned the patient was taking too much Xanax. No falls last 48 hours. However impulsive and remains a fall risk. -Given significant Xanax use is now patient, will schedule low-dose benzodiazepine for now. Recommend weaning as an outpatient.  #5: Dysarthria. At baseline. Long-standing per wife. MRI was unremarkable.  #6: Streptococcus viridans bacteremia, per infectious disease treat with 10 days of oral antibiotics.  -Remains stable. Continue Keflex.  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 Appears to be stable.  #8:Seizure disorder, stable. #9: PMH bipolar 1 disorder, mental retardation #10: COPD, appears stable. #11: Obesity    Appears to be medically stable at this point, main issue is impulsive impulsiveness and fall risk. One sitter can be discontinued, can transfer to skilled nursing facility.  DVT prophylaxis: SCDs Code Status: DNR Family Communication:  Disposition Plan: SNF    [name], MD  [facility] Direct contact: [phone] --Via amion app OR  --[email or web address]; password [email or web address]  7PM-7AM contact night coverage as above 12/31/2016, 9:36 AM  LOS: 9 days   Consultants:  Neurology  Procedures:  5/4: Lumbar puncture  Antimicrobials:  Vancomycin 5/2>>5/5  Meropenem 5/2>>5/5  Ceftriaxone 5/5>>5/8  Keflex 5/8>>   Interval history/Subjective: Rested most the night. Sitter at bedside reports he is confused, impulsive and remains a fall risk.  Objective: Temperature 97.3, respirations 18, pulse 72, blood pressure 135/65   Constitutional: Appears calm, comfortable. Sleeping.  Cardiovascular: Regular rate and rhythm. No murmur, rub or gallop. No lower extremity edema.  Respiratory: Clear to auscultation bilaterally. No wheezes, rales or rhonchi. Normal respiratory effort.  Psychiatric: Cannot assess at this point.  Intake/Output Summary (Last 24 hours) at 12/31/16 0936 Last data filed at 12/31/16 0900  Gross per 24 hour  Intake              870 ml  Output              100 ml  Net              770 ml     Filed Weights   12/26/16 0500 12/29/16 1430 12/30/16 0556  Weight: 104.3 kg (229 lb 15 oz) 101.6 kg (223 lb 15.8 oz) 98.2 kg (216 lb 7.9 oz)    Exam:     Constitutional:  Confused, trying to get out, got out of bed. Speech difficult to understand.  Cardiovascular: Regular rate and rhythm. No murmur, rub or gallop. No lower extremity edema.  Respiratory: Clear to auscultation bilaterally. No wheezes,  rales or rhonchi. Normal respiratory effort.  Psychiatric: Confused. Speech is very dysarthric.  Musculoskeletal: Moves all extremities spontaneously.  I have personally reviewed the following:   Labs:    Imaging studies:     Scheduled Meds: . cephALEXin  500 mg Oral Q8H  . chlorhexidine  15 mL Mouth Rinse BID  . Chlorhexidine Gluconate Cloth  6 each Topical Daily  . ezetimibe  10 mg Oral Daily  . fluconazole  100 mg Oral Daily  . lactulose  40 g Oral TID  . mouth rinse  15 mL Mouth Rinse q12n4p  . metoprolol tartrate  50 mg Oral QHS  . metoprolol tartrate  25 mg Oral Daily  . multivitamin with minerals  1 tablet Oral Daily  . nystatin   Topical TID  . pantoprazole  40 mg Oral Daily  . potassium chloride SA  20 mEq Oral BID  . QUEtiapine  25 mg Oral BID  . rifaximin  550 mg Oral BID  . sodium chloride flush  10-40 mL Intracatheter Q12H   Continuous Infusions:   Principal Problem:   Acute encephalopathy Active Problems:   COPD (chronic obstructive pulmonary disease) (HCC)   HTN (hypertension)   Cirrhosis (HCC)   Thrombocytopenia (HCC)   Anxiety   Fall   Cerebral contusion ([place])   Subarachnoid hemorrhage ([place])   Bacteremia   LOS: 9 days

## 2016-12-31 NOTE — Care Management Important Message (Signed)
Important Message  Patient Details  Name: THOREN HOSANG MRN: [id number] Date of Birth: 1960/12/29   Medicare Important Message Given:  Yes    Earnest Mcgillis, Chauncey [place], RN 12/31/2016, 3:35 PM

## 2016-12-31 NOTE — Progress Notes (Signed)
CSW following for disposition/ DC planning. Family has selected [facility] for [employer]. [employer] provided auth# [id number] (representative Lauren).  Spoke with facility. Per [facility] [place], will waive 24 hours without sitter protocol in pt's case. Can admit pt 01/01/17 am.  Spoke with pt's wife [place] (note correct number is [phone]) and she will be available over the weekend to assist in pt's admission to [facility].  CSW will continue following and assist with DC.  Sharren Bridge, MSW, LCSW Clinical Social Work 12/31/2016 [phone]

## 2017-01-01 DIAGNOSIS — I609 Nontraumatic subarachnoid hemorrhage, unspecified: Secondary | ICD-10-CM

## 2017-01-01 NOTE — Clinical Social Work Placement (Signed)
   CLINICAL SOCIAL WORK PLACEMENT  NOTE  Date:  01/01/2017  Patient Details  Name: [name] [name] MRN: [id number] Date of Birth: 1961-04-18  Clinical Social Work is seeking post-discharge placement for this patient at the [place] level of care (*CSW will initial, date and re-position this form in  chart as items are completed):  Yes   Patient/family provided with [place] Work Department's list of facilities offering this level of care within the geographic area requested by the patient (or if unable, by the patient's family).  Yes   Patient/family informed of their freedom to choose among providers that offer the needed level of care, that participate in Medicare, Medicaid or managed care program needed by the patient, have an available bed and are willing to accept the patient.  Yes   Patient/family informed of [facility]'s ownership interest in [facility] and [facility], as well as of the fact that they are under no obligation to receive care at these facilities.  PASRR submitted to EDS on 12/24/16     PASRR number received on       Existing PASRR number confirmed on       FL2 transmitted to all facilities in geographic area requested by pt/family on 12/24/16     FL2 transmitted to all facilities within larger geographic area on       Patient informed that his/her managed care company has contracts with or will negotiate with certain facilities, including the following:        Yes   Patient/family informed of bed offers received.  Patient chooses bed at [facility]     Physician recommends and patient chooses bed at      Patient to be transferred to [facility] on 01/01/17.  Patient to be transferred to facility by [facility]     Patient family notified on 01/01/17 of transfer.  Name of family member notified:  wife     PHYSICIAN Please sign FL2, Please sign DNR     Additional Comment:     _______________________________________________ Lilly C[place], LCSW 01/01/2017, 10:39 AM

## 2017-01-01 NOTE — Progress Notes (Signed)
Pt to be transported to [facility] via EMS.  Called report to [place] at [facility] and answered all questions at this time.

## 2017-01-01 NOTE — Progress Notes (Signed)
PICC line removed without difficulty, vaseline gauze and 2x2's applied, pressure held x 2 minutes, secured with tape. 

## 2017-01-01 NOTE — Progress Notes (Signed)
Patient is medically stable for discharge to SNF: [facility]. Patient will transport by EMS to facility. RN to call report: [phone]  Wife aware of plans Insurance Josem Kaufmann has been obtained  Information has been faxed to facility.  No other needs.  Lane Hacker, MSW Clinical Social Work: [employer] Coverage for :  [phone]

## 2017-04-20 ENCOUNTER — Ambulatory Visit: Payer: Medicare HMO | Admitting: Nurse Practitioner

## 2017-08-26 DIAGNOSIS — T1490XA Injury, unspecified, initial encounter: Secondary | ICD-10-CM | POA: Diagnosis not present

## 2017-09-11 ENCOUNTER — Emergency Department (HOSPITAL_COMMUNITY): Payer: Medicare HMO

## 2017-09-11 ENCOUNTER — Encounter (HOSPITAL_COMMUNITY): Payer: Self-pay

## 2017-09-11 ENCOUNTER — Inpatient Hospital Stay (HOSPITAL_COMMUNITY)
Admission: EM | Admit: 2017-09-11 | Discharge: 2017-09-14 | DRG: 443 | Disposition: A | Discharge status: Hospice | Payer: Medicare HMO | Attending: Family Medicine | Admitting: Family Medicine

## 2017-09-11 ENCOUNTER — Other Ambulatory Visit: Payer: Self-pay

## 2017-09-11 DIAGNOSIS — G9340 Encephalopathy, unspecified: Secondary | ICD-10-CM

## 2017-09-11 DIAGNOSIS — M1990 Unspecified osteoarthritis, unspecified site: Secondary | ICD-10-CM | POA: Diagnosis present

## 2017-09-11 DIAGNOSIS — R809 Proteinuria, unspecified: Secondary | ICD-10-CM | POA: Diagnosis present

## 2017-09-11 DIAGNOSIS — K7460 Unspecified cirrhosis of liver: Secondary | ICD-10-CM | POA: Diagnosis not present

## 2017-09-11 DIAGNOSIS — R69 Illness, unspecified: Secondary | ICD-10-CM | POA: Diagnosis not present

## 2017-09-11 DIAGNOSIS — R4182 Altered mental status, unspecified: Secondary | ICD-10-CM | POA: Diagnosis not present

## 2017-09-11 DIAGNOSIS — Z66 Do not resuscitate: Secondary | ICD-10-CM | POA: Diagnosis present

## 2017-09-11 DIAGNOSIS — I119 Hypertensive heart disease without heart failure: Secondary | ICD-10-CM | POA: Diagnosis present

## 2017-09-11 DIAGNOSIS — F329 Major depressive disorder, single episode, unspecified: Secondary | ICD-10-CM | POA: Diagnosis present

## 2017-09-11 DIAGNOSIS — Z515 Encounter for palliative care: Secondary | ICD-10-CM | POA: Diagnosis not present

## 2017-09-11 DIAGNOSIS — R748 Abnormal levels of other serum enzymes: Secondary | ICD-10-CM | POA: Diagnosis not present

## 2017-09-11 DIAGNOSIS — D696 Thrombocytopenia, unspecified: Secondary | ICD-10-CM | POA: Diagnosis present

## 2017-09-11 DIAGNOSIS — E119 Type 2 diabetes mellitus without complications: Secondary | ICD-10-CM | POA: Diagnosis not present

## 2017-09-11 DIAGNOSIS — K7682 Hepatic encephalopathy: Secondary | ICD-10-CM | POA: Diagnosis present

## 2017-09-11 DIAGNOSIS — S299XXA Unspecified injury of thorax, initial encounter: Secondary | ICD-10-CM | POA: Diagnosis not present

## 2017-09-11 DIAGNOSIS — H5461 Unqualified visual loss, right eye, normal vision left eye: Secondary | ICD-10-CM | POA: Diagnosis present

## 2017-09-11 DIAGNOSIS — K7200 Acute and subacute hepatic failure without coma: Principal | ICD-10-CM | POA: Diagnosis present

## 2017-09-11 DIAGNOSIS — F319 Bipolar disorder, unspecified: Secondary | ICD-10-CM | POA: Diagnosis present

## 2017-09-11 DIAGNOSIS — R823 Hemoglobinuria: Secondary | ICD-10-CM | POA: Diagnosis present

## 2017-09-11 DIAGNOSIS — Z7189 Other specified counseling: Secondary | ICD-10-CM

## 2017-09-11 DIAGNOSIS — M549 Dorsalgia, unspecified: Secondary | ICD-10-CM | POA: Diagnosis present

## 2017-09-11 DIAGNOSIS — Z8673 Personal history of transient ischemic attack (TIA), and cerebral infarction without residual deficits: Secondary | ICD-10-CM

## 2017-09-11 DIAGNOSIS — F32A Depression, unspecified: Secondary | ICD-10-CM | POA: Diagnosis present

## 2017-09-11 DIAGNOSIS — E785 Hyperlipidemia, unspecified: Secondary | ICD-10-CM | POA: Diagnosis present

## 2017-09-11 DIAGNOSIS — Z880 Allergy status to penicillin: Secondary | ICD-10-CM

## 2017-09-11 DIAGNOSIS — F17210 Nicotine dependence, cigarettes, uncomplicated: Secondary | ICD-10-CM | POA: Diagnosis present

## 2017-09-11 DIAGNOSIS — I10 Essential (primary) hypertension: Secondary | ICD-10-CM | POA: Diagnosis present

## 2017-09-11 DIAGNOSIS — F79 Unspecified intellectual disabilities: Secondary | ICD-10-CM | POA: Diagnosis present

## 2017-09-11 DIAGNOSIS — K7581 Nonalcoholic steatohepatitis (NASH): Secondary | ICD-10-CM | POA: Diagnosis present

## 2017-09-11 DIAGNOSIS — K219 Gastro-esophageal reflux disease without esophagitis: Secondary | ICD-10-CM | POA: Diagnosis present

## 2017-09-11 DIAGNOSIS — R339 Retention of urine, unspecified: Secondary | ICD-10-CM | POA: Diagnosis present

## 2017-09-11 DIAGNOSIS — E876 Hypokalemia: Secondary | ICD-10-CM | POA: Diagnosis present

## 2017-09-11 DIAGNOSIS — F419 Anxiety disorder, unspecified: Secondary | ICD-10-CM | POA: Diagnosis present

## 2017-09-11 DIAGNOSIS — Z79899 Other long term (current) drug therapy: Secondary | ICD-10-CM

## 2017-09-11 DIAGNOSIS — E13638 Other specified diabetes mellitus with other oral complications: Secondary | ICD-10-CM | POA: Diagnosis not present

## 2017-09-11 DIAGNOSIS — G8929 Other chronic pain: Secondary | ICD-10-CM | POA: Diagnosis present

## 2017-09-11 DIAGNOSIS — R410 Disorientation, unspecified: Secondary | ICD-10-CM | POA: Diagnosis not present

## 2017-09-11 DIAGNOSIS — E7220 Disorder of urea cycle metabolism, unspecified: Secondary | ICD-10-CM | POA: Diagnosis not present

## 2017-09-11 DIAGNOSIS — J449 Chronic obstructive pulmonary disease, unspecified: Secondary | ICD-10-CM | POA: Diagnosis present

## 2017-09-11 DIAGNOSIS — K7290 Hepatic failure, unspecified without coma: Secondary | ICD-10-CM | POA: Diagnosis present

## 2017-09-11 DIAGNOSIS — R402411 Glasgow coma scale score 13-15, in the field [EMT or ambulance]: Secondary | ICD-10-CM | POA: Diagnosis not present

## 2017-09-11 DIAGNOSIS — K7469 Other cirrhosis of liver: Secondary | ICD-10-CM | POA: Diagnosis not present

## 2017-09-11 DIAGNOSIS — Z7951 Long term (current) use of inhaled steroids: Secondary | ICD-10-CM

## 2017-09-11 HISTORY — DX: Altered mental status, unspecified: R41.82

## 2017-09-11 LAB — CBC WITH DIFFERENTIAL/PLATELET
BASOS ABS: 0 10*3/uL (ref 0.0–0.1)
BASOS PCT: 1 %
Eosinophils Absolute: 0.2 10*3/uL (ref 0.0–0.7)
Eosinophils Relative: 4 %
HEMATOCRIT: 45.9 % (ref 39.0–52.0)
HEMOGLOBIN: 15.6 g/dL (ref 13.0–17.0)
Lymphocytes Relative: 24 %
Lymphs Abs: 1.3 10*3/uL (ref 0.7–4.0)
MCH: 33.7 pg (ref 26.0–34.0)
MCHC: 34 g/dL (ref 30.0–36.0)
MCV: 99.1 fL (ref 78.0–100.0)
Monocytes Absolute: 0.6 10*3/uL (ref 0.1–1.0)
Monocytes Relative: 11 %
NEUTROS ABS: 3.1 10*3/uL (ref 1.7–7.7)
NEUTROS PCT: 60 %
Platelets: 88 10*3/uL — ABNORMAL LOW (ref 150–400)
RBC: 4.63 MIL/uL (ref 4.22–5.81)
RDW: 14.3 % (ref 11.5–15.5)
WBC: 5.2 10*3/uL (ref 4.0–10.5)

## 2017-09-11 LAB — COMPREHENSIVE METABOLIC PANEL
ALBUMIN: 3.3 g/dL — AB (ref 3.5–5.0)
ALK PHOS: 142 U/L — AB (ref 38–126)
ALT: 17 U/L (ref 17–63)
AST: 44 U/L — AB (ref 15–41)
Anion gap: 9 (ref 5–15)
BILIRUBIN TOTAL: 2.9 mg/dL — AB (ref 0.3–1.2)
BUN: 14 mg/dL (ref 6–20)
CO2: 23 mmol/L (ref 22–32)
CREATININE: 0.74 mg/dL (ref 0.61–1.24)
Calcium: 9.4 mg/dL (ref 8.9–10.3)
Chloride: 108 mmol/L (ref 101–111)
GFR calc Af Amer: >60 mL/min (ref >60)
GFR calc non Af Amer: >60 mL/min (ref >60)
GLUCOSE: 112 mg/dL — AB (ref 65–99)
POTASSIUM: 3.5 mmol/L (ref 3.5–5.1)
Sodium: 140 mmol/L (ref 135–145)
TOTAL PROTEIN: 6.9 g/dL (ref 6.5–8.1)

## 2017-09-11 LAB — ETHANOL: Alcohol, Ethyl (B): <10 mg/dL (ref <10)

## 2017-09-11 LAB — AMMONIA: Ammonia: 73 umol/L — ABNORMAL HIGH (ref 9–35)

## 2017-09-11 LAB — LIPASE, BLOOD: Lipase: 54 U/L — ABNORMAL HIGH (ref 11–51)

## 2017-09-11 MED ORDER — LACTULOSE 10 GM/15ML PO SOLN
30.0000 g | ORAL | Status: AC
  Administered 2017-09-12: 30 g via ORAL
  Filled 2017-09-11: qty 60

## 2017-09-11 NOTE — ED Notes (Signed)
Patient transported to CT 

## 2017-09-11 NOTE — ED Triage Notes (Signed)
[facility] got called out for a fall. Pt has hx of dementia and is a poor historian. Pt family is unable to drive to hospital to accompany pt. EMS reports family says "when he gets like this, usually his ammonia level is elevated." EMS found pt on knees beside couch upon arrival. Pt reports pain to left thumb, but denies any other pain or symptoms. Pt alert to name only.

## 2017-09-11 NOTE — ED Provider Notes (Signed)
[facility] EMERGENCY DEPARTMENT Provider Note   CSN: [id number] Arrival date & time:        History   Chief Complaint Chief Complaint  Patient presents with  . Fall   Level 5 caveat HPI AMAURY KUZEL is a 57 y.o. male. Level 5 caveat-patient unable to give history.  Patient history obtained from ems.  Remainder of history from wife via phone.  HPI  57 y.o. Man ho  presents today from home via ems- wife states he acted like something was wrong.  He was staring into space and not blinking- when she asked him something he would state "I don't know"  She and her sister decided he needed to go to the hospital.  Two days ago he knew the date, he walks using a cane.   No fever, decreased po unable to get him to get up today.  No vomiting or diarrhea. Review of old records reveal hospitalizations in 2018 for cirrhosis, hepatic encephalopathy, seizure disorder.       Primary md [facility] Past Medical History:  Diagnosis Date  . Anemia   . Anxiety   . Arthritis   . Bipolar 1 disorder ([place])   . Blind right eye   . Blood clots in brain    per patient  . Cardiomegaly   . Cataract    Right eye  . Cerebrovascular disease    Right vertebral artery  . Cholelithiasis    Asymptomatic  . Chronic back pain   . Cirrhosis ([place])    suspected NASH  . Cirrhosis ([place])    diagnosed 07/2010 when presented with first episode of hepatic encephalopathy, afp on 416/13=4.8,  U/S on 12/14/11  liver stable, pt has received 2 hep A/B vaccines  . COPD (chronic obstructive pulmonary disease) ([place])   . Depression   . Diabetes mellitus   . Diverticulosis   . GERD (gastroesophageal reflux disease)   . Hard of hearing   . Hepatic encephalopathy ([place])   . Hyperlipidemia   . Hypertension   . Mental retardation   . Seizures ([place])   . Thrombocytopenia [facility])     Patient Active Problem List   Diagnosis Date Noted  . Acute encephalopathy 12/29/2016  . Subarachnoid hemorrhage ([place])  12/29/2016  . Bacteremia 12/29/2016  . Fall 12/21/2016  . Cerebral contusion ([place]) 12/21/2016  . Pressure injury of skin 12/14/2016  . Anxiety   . Suicidal ideation   . Acute hepatic encephalopathy ([place]) 08/02/2013  . Hypokalemia 02/01/2013  . Urinary incontinence 01/31/2013  . Pancytopenia ([place]) 09/17/2012  . Edema of right lower extremity 04/28/2012  . Hepatic encephalopathy ([place]) 04/28/2012  . Arthritis of ankle or foot, degenerative 04/26/2012  . Hematemesis 12/07/2011  . Thrombocytopenia ([place]) 12/07/2011  . Anemia 12/07/2011  . COPD (chronic obstructive pulmonary disease) ([place]) 08/25/2011  . DM (diabetes mellitus) ([place]) 08/25/2011  . HTN (hypertension) 08/25/2011  . Coagulopathy ([place]) 08/25/2011  . Cirrhosis ([place]) 08/25/2011  . Depression 08/25/2011  . Seizure disorder ([place]) 08/25/2011  . GERD (gastroesophageal reflux disease) 08/25/2011  . Tobacco abuse 08/25/2011  . Ankle fracture, bimalleolar, closed 08/04/2011    Past Surgical History:  Procedure Laterality Date  . CAST APPLICATION  5/0/0938   Procedure: MINOR CAST APPLICATION;  Surgeon: [name], MD;  Location: [facility];  Service: Orthopedics;  Laterality: Right;  no anesthesia , procedure room do not need or room !  . COLONOSCOPY  12/15/11   colonic divericulosis;poor prep, ACBE  recommended  but has not been done  . ear operations    . ESOPHAGOGASTRODUODENOSCOPY  02/02/2011   [place]-Normal esophagus.  No varices/Question mild portal gastropathy, extrinsic compression on the antrum, lesser curvature of uncertain significance, some minimally  nodular mucosa status post biopsy, patent pylorus, normal duodenum 1 and duodenum 2.  . ESOPHAGOGASTRODUODENOSCOPY  12/15/11   [place]k-->mild changes of portal gastropathy, gastric/duodenal erosions s/p bx  (reactive changes with mild chronic inflammation. No H.pylori  . FOOT SURGERY    . ORIF ANKLE FRACTURE  08/27/2011   Procedure: OPEN REDUCTION INTERNAL FIXATION (ORIF) ANKLE  FRACTURE;  Surgeon: [name], MD;  Location: [facility];  Service: Orthopedics;  Laterality: Right;  . TONSILLECTOMY         Home Medications    Prior to Admission medications   Medication Sig Start Date End Date Taking? Authorizing Provider  ALPRAZolam Duanne Moron) 0.5 MG tablet Take 0.5 mg by mouth 3 (three) times daily as needed for anxiety.    [provider]  ezetimibe (ZETIA) 10 MG tablet Take 10 mg by mouth daily.    [provider]  gabapentin (NEURONTIN) 300 MG capsule Take 300 mg by mouth 3 (three) times daily.    [provider]  lactulose (CHRONULAC) 10 GM/15ML solution Take 60 mLs (40 g total) by mouth 3 (three) times daily. Titrate for 3-4 bowel motions daily. 12/15/16   Elgergawy, Silver Huguenin, MD  metoprolol tartrate (LOPRESSOR) 25 MG tablet Take 1 tablet (25 mg total) by mouth 2 (two) times daily. 12/31/16   Samuella Cota, MD  Multiple Vitamin (MULTIVITAMIN WITH MINERALS) TABS Take 1 tablet by mouth daily.    [provider]  omeprazole (PRILOSEC) 20 MG capsule Take 20 mg by mouth daily.    [provider]  potassium chloride SA (K-DUR,KLOR-CON) 20 MEQ tablet Take 20 mEq by mouth 2 (two) times daily.    [provider]  QUEtiapine (SEROQUEL) 25 MG tablet Take 1 tablet (25 mg total) by mouth at bedtime. 12/31/16   Samuella Cota, MD  rifaximin (XIFAXAN) 550 MG TABS tablet Take 1 tablet (550 mg total) by mouth 2 (two) times daily. 12/31/16   Samuella Cota, MD    Family History Family History  Problem Relation Age of Onset  . Heart failure Mother   . Thyroid disease Mother   . Cancer Father   . Asthma Brother   . Heart attack Brother   . Cirrhosis Brother        EtOH  . Colon cancer Maternal Aunt     Social History Social History   Tobacco Use  . Smoking status: Current Every Day Smoker    Packs/day: 1.00    Years: 30.00    Pack years: 30.00    Types: Cigarettes  . Smokeless tobacco: Never Used  .  Tobacco comment: only smokes about 1-2 a day  Substance Use Topics  . Alcohol use: No  . Drug use: No     Allergies   Penicillins   Review of Systems Review of Systems   Physical Exam Updated Vital Signs Wt 98 kg (216 lb)   BMI 32.84 kg/m   Physical Exam  Constitutional: He appears well-developed and well-nourished. No distress.  HENT:  Head: Normocephalic and atraumatic.  Right eye with prior injury  Neck: Normal range of motion.  Cardiovascular: Normal rate and regular rhythm.  Pulmonary/Chest: Effort normal and breath sounds normal.  Abdominal: Soft. Bowel sounds are normal.  Musculoskeletal:  rle with  previous injury and abnormal healing right ankle No acute abnormalties noted  Neurological: He is alert.  Patient is alert and does not have focal abnormalities but has difficulty answering questions.  Able to tell name,but not oriented to place or date  Skin: Skin is warm and dry. Capillary refill takes less than 2 seconds.  Nursing note and vitals reviewed.    ED Treatments / Results  Labs (all labs ordered are listed, but only abnormal results are displayed) Labs Reviewed  CBC WITH DIFFERENTIAL/PLATELET  COMPREHENSIVE METABOLIC PANEL  LIPASE, BLOOD  AMMONIA    EKG  EKG Interpretation None       Radiology Ct Head Wo Contrast  Result Date: 09/11/2017 CLINICAL DATA:  Altered mental status, poor historian EXAM: CT HEAD WITHOUT CONTRAST TECHNIQUE: Contiguous axial images were obtained from the base of the skull through the vertex without intravenous contrast. COMPARISON:  MRI 12/27/2016, CT 12/22/2016, 12/21/2016, 12/13/2016 FINDINGS: Brain: No acute territorial infarction, hemorrhage or intracranial mass is visualized. Mild atrophy. Normal ventricle size. Vascular: No hyperdense vessels. Minimal calcification at the carotid siphons Skull: No fracture or suspicious abnormality. Sinuses/Orbits: Right this is bulbi. Minimal mucosal thickening in the ethmoid  sinuses. Other: None IMPRESSION: No definite CT evidence for acute intracranial abnormality. Electronically Signed   By: Donavan Foil M.D.   On: 09/11/2017 22:52   Dg Chest Port 1 View  Result Date: 09/11/2017 CLINICAL DATA:  Fall. EXAM: PORTABLE CHEST 1 VIEW COMPARISON:  Dec 22, 2016 FINDINGS: Mild cardiomegaly. The hila and mediastinum are unchanged. No pulmonary nodules, masses, or focal infiltrates. No overt edema. IMPRESSION: No active disease. Electronically Signed   By: Dorise Bullion III M.D   On: 09/11/2017 22:09    Procedures Procedures (including critical care time)  Medications Ordered in ED Medications - No data to display   Initial Impression / Assessment and Plan / ED Course  I have reviewed the triage vital signs and the nursing notes.  Pertinent labs & imaging results that were available during my care of the patient were reviewed by me and considered in my medical decision making (see chart for details).     1 altered mental status- difficult to know baseline but from discussion with wife has some superimposed encephalopathy.Patient with elevated ammonia here, no acute abnormality on head ct.  Will give lactulose Will add uds as patient has h.o of xanax use.  Also ho seizure disorder- no evidence of seizures here. DDX- toxic- uds and etoh pending Infection- no fever, wbc normal, cxr clear No evidence of hypoxia- sats 95-97% Electrolytes normal No lateralized deficits c.w. Stroke  2- elevated lipase and mildly elevated transaminase-stable from prior .  HO nash   Discussed with Dr. Olevia Bowens and he will admit Final Clinical Impressions(s) / ED Diagnoses   Final diagnoses:  Encephalopathy  Hyperammonemia ([place])  Elevated liver enzymes    ED Discharge Orders    None       Pattricia Boss, MD 09/11/17 2332

## 2017-09-12 ENCOUNTER — Other Ambulatory Visit: Payer: Self-pay

## 2017-09-12 ENCOUNTER — Encounter (HOSPITAL_COMMUNITY): Payer: Self-pay

## 2017-09-12 DIAGNOSIS — Z515 Encounter for palliative care: Secondary | ICD-10-CM | POA: Diagnosis not present

## 2017-09-12 DIAGNOSIS — Z880 Allergy status to penicillin: Secondary | ICD-10-CM | POA: Diagnosis not present

## 2017-09-12 DIAGNOSIS — K7200 Acute and subacute hepatic failure without coma: Principal | ICD-10-CM

## 2017-09-12 DIAGNOSIS — F17210 Nicotine dependence, cigarettes, uncomplicated: Secondary | ICD-10-CM | POA: Diagnosis present

## 2017-09-12 DIAGNOSIS — R410 Disorientation, unspecified: Secondary | ICD-10-CM | POA: Diagnosis not present

## 2017-09-12 DIAGNOSIS — Z8673 Personal history of transient ischemic attack (TIA), and cerebral infarction without residual deficits: Secondary | ICD-10-CM | POA: Diagnosis not present

## 2017-09-12 DIAGNOSIS — E785 Hyperlipidemia, unspecified: Secondary | ICD-10-CM | POA: Diagnosis present

## 2017-09-12 DIAGNOSIS — R748 Abnormal levels of other serum enzymes: Secondary | ICD-10-CM | POA: Diagnosis not present

## 2017-09-12 DIAGNOSIS — M1990 Unspecified osteoarthritis, unspecified site: Secondary | ICD-10-CM | POA: Diagnosis present

## 2017-09-12 DIAGNOSIS — F79 Unspecified intellectual disabilities: Secondary | ICD-10-CM | POA: Diagnosis present

## 2017-09-12 DIAGNOSIS — D696 Thrombocytopenia, unspecified: Secondary | ICD-10-CM | POA: Diagnosis not present

## 2017-09-12 DIAGNOSIS — K7469 Other cirrhosis of liver: Secondary | ICD-10-CM | POA: Diagnosis not present

## 2017-09-12 DIAGNOSIS — E13638 Other specified diabetes mellitus with other oral complications: Secondary | ICD-10-CM | POA: Diagnosis not present

## 2017-09-12 DIAGNOSIS — H5461 Unqualified visual loss, right eye, normal vision left eye: Secondary | ICD-10-CM | POA: Diagnosis not present

## 2017-09-12 DIAGNOSIS — E119 Type 2 diabetes mellitus without complications: Secondary | ICD-10-CM | POA: Diagnosis not present

## 2017-09-12 DIAGNOSIS — M549 Dorsalgia, unspecified: Secondary | ICD-10-CM | POA: Diagnosis not present

## 2017-09-12 DIAGNOSIS — R339 Retention of urine, unspecified: Secondary | ICD-10-CM | POA: Diagnosis present

## 2017-09-12 DIAGNOSIS — F329 Major depressive disorder, single episode, unspecified: Secondary | ICD-10-CM | POA: Diagnosis not present

## 2017-09-12 DIAGNOSIS — J449 Chronic obstructive pulmonary disease, unspecified: Secondary | ICD-10-CM | POA: Diagnosis not present

## 2017-09-12 DIAGNOSIS — R69 Illness, unspecified: Secondary | ICD-10-CM | POA: Diagnosis not present

## 2017-09-12 DIAGNOSIS — Z7189 Other specified counseling: Secondary | ICD-10-CM | POA: Diagnosis not present

## 2017-09-12 DIAGNOSIS — F319 Bipolar disorder, unspecified: Secondary | ICD-10-CM | POA: Diagnosis present

## 2017-09-12 DIAGNOSIS — G8929 Other chronic pain: Secondary | ICD-10-CM | POA: Diagnosis present

## 2017-09-12 DIAGNOSIS — Z66 Do not resuscitate: Secondary | ICD-10-CM | POA: Diagnosis not present

## 2017-09-12 DIAGNOSIS — E876 Hypokalemia: Secondary | ICD-10-CM | POA: Diagnosis present

## 2017-09-12 DIAGNOSIS — K219 Gastro-esophageal reflux disease without esophagitis: Secondary | ICD-10-CM | POA: Diagnosis not present

## 2017-09-12 DIAGNOSIS — R823 Hemoglobinuria: Secondary | ICD-10-CM | POA: Diagnosis present

## 2017-09-12 DIAGNOSIS — E7220 Disorder of urea cycle metabolism, unspecified: Secondary | ICD-10-CM | POA: Diagnosis present

## 2017-09-12 DIAGNOSIS — K7581 Nonalcoholic steatohepatitis (NASH): Secondary | ICD-10-CM | POA: Diagnosis present

## 2017-09-12 DIAGNOSIS — F419 Anxiety disorder, unspecified: Secondary | ICD-10-CM | POA: Diagnosis present

## 2017-09-12 DIAGNOSIS — I119 Hypertensive heart disease without heart failure: Secondary | ICD-10-CM | POA: Diagnosis present

## 2017-09-12 DIAGNOSIS — R809 Proteinuria, unspecified: Secondary | ICD-10-CM | POA: Diagnosis present

## 2017-09-12 DIAGNOSIS — K7460 Unspecified cirrhosis of liver: Secondary | ICD-10-CM | POA: Diagnosis not present

## 2017-09-12 LAB — MAGNESIUM: Magnesium: 1.7 mg/dL (ref 1.7–2.4)

## 2017-09-12 LAB — CBC WITH DIFFERENTIAL/PLATELET
BASOS ABS: 0 10*3/uL (ref 0.0–0.1)
Basophils Relative: 0 %
EOS PCT: 4 %
Eosinophils Absolute: 0.2 10*3/uL (ref 0.0–0.7)
HCT: 40 % (ref 39.0–52.0)
Hemoglobin: 14.3 g/dL (ref 13.0–17.0)
LYMPHS PCT: 31 %
Lymphs Abs: 1.5 10*3/uL (ref 0.7–4.0)
MCH: 34.8 pg — ABNORMAL HIGH (ref 26.0–34.0)
MCHC: 35.8 g/dL (ref 30.0–36.0)
MCV: 97.3 fL (ref 78.0–100.0)
Monocytes Absolute: 0.6 10*3/uL (ref 0.1–1.0)
Monocytes Relative: 12 %
Neutro Abs: 2.5 10*3/uL (ref 1.7–7.7)
Neutrophils Relative %: 53 %
PLATELETS: 78 10*3/uL — AB (ref 150–400)
RBC: 4.11 MIL/uL — ABNORMAL LOW (ref 4.22–5.81)
RDW: 14.3 % (ref 11.5–15.5)
WBC: 4.7 10*3/uL (ref 4.0–10.5)

## 2017-09-12 LAB — GLUCOSE, CAPILLARY
Glucose-Capillary: 89 mg/dL (ref 65–99)
Glucose-Capillary: 142 mg/dL — ABNORMAL HIGH (ref 65–99)
Glucose-Capillary: 99 mg/dL (ref 65–99)
Glucose-Capillary: 123 mg/dL — ABNORMAL HIGH (ref 65–99)

## 2017-09-12 LAB — URINALYSIS, ROUTINE W REFLEX MICROSCOPIC
BACTERIA UA: NONE SEEN
Bilirubin Urine: NEGATIVE
Glucose, UA: NEGATIVE mg/dL
Ketones, ur: NEGATIVE mg/dL
Leukocytes, UA: NEGATIVE
Nitrite: NEGATIVE
PROTEIN: 100 mg/dL — AB
Specific Gravity, Urine: 1.012 (ref 1.005–1.030)
pH: 7 (ref 5.0–8.0)

## 2017-09-12 LAB — COMPREHENSIVE METABOLIC PANEL
ALT: 17 U/L (ref 17–63)
AST: 39 U/L (ref 15–41)
Albumin: 3 g/dL — ABNORMAL LOW (ref 3.5–5.0)
Alkaline Phosphatase: 126 U/L (ref 38–126)
Anion gap: 9 (ref 5–15)
BUN: 13 mg/dL (ref 6–20)
CHLORIDE: 110 mmol/L (ref 101–111)
CO2: 22 mmol/L (ref 22–32)
CREATININE: 0.59 mg/dL — AB (ref 0.61–1.24)
Calcium: 9.1 mg/dL (ref 8.9–10.3)
GFR calc Af Amer: >60 mL/min (ref >60)
GFR calc non Af Amer: >60 mL/min (ref >60)
Glucose, Bld: 112 mg/dL — ABNORMAL HIGH (ref 65–99)
Potassium: 3 mmol/L — ABNORMAL LOW (ref 3.5–5.1)
SODIUM: 141 mmol/L (ref 135–145)
Total Bilirubin: 2.9 mg/dL — ABNORMAL HIGH (ref 0.3–1.2)
Total Protein: 6.3 g/dL — ABNORMAL LOW (ref 6.5–8.1)

## 2017-09-12 LAB — RAPID URINE DRUG SCREEN, HOSP PERFORMED
AMPHETAMINES: NOT DETECTED
Barbiturates: NOT DETECTED
Benzodiazepines: POSITIVE — AB
Cocaine: NOT DETECTED
OPIATES: NOT DETECTED
TETRAHYDROCANNABINOL: NOT DETECTED

## 2017-09-12 LAB — AMMONIA: Ammonia: 66 umol/L — ABNORMAL HIGH (ref 9–35)

## 2017-09-12 LAB — PHOSPHORUS: Phosphorus: 2.8 mg/dL (ref 2.5–4.6)

## 2017-09-12 MED ORDER — RIFAXIMIN 550 MG PO TABS
550.0000 mg | ORAL_TABLET | Freq: Two times a day (BID) | ORAL | Status: DC
  Administered 2017-09-12 – 2017-09-14 (×6): 550 mg via ORAL
  Filled 2017-09-12 (×6): qty 1

## 2017-09-12 MED ORDER — VITAMIN D 1000 UNITS PO TABS
1000.0000 [IU] | ORAL_TABLET | Freq: Every day | ORAL | Status: DC
  Administered 2017-09-12 – 2017-09-14 (×3): 1000 [IU] via ORAL
  Filled 2017-09-12 (×4): qty 1

## 2017-09-12 MED ORDER — EZETIMIBE 10 MG PO TABS
10.0000 mg | ORAL_TABLET | Freq: Every day | ORAL | Status: DC
  Administered 2017-09-12 – 2017-09-14 (×3): 10 mg via ORAL
  Filled 2017-09-12 (×3): qty 1

## 2017-09-12 MED ORDER — ONDANSETRON HCL 4 MG PO TABS: 4.0000 mg | ORAL_TABLET | Freq: Four times a day (QID) | ORAL | Status: DC | PRN

## 2017-09-12 MED ORDER — QUETIAPINE FUMARATE 25 MG PO TABS
25.0000 mg | ORAL_TABLET | Freq: Every day | ORAL | Status: DC
  Administered 2017-09-12: 25 mg via ORAL
  Filled 2017-09-12: qty 1

## 2017-09-12 MED ORDER — METOPROLOL TARTRATE 50 MG PO TABS: 50.0000 mg | ORAL_TABLET | Freq: Two times a day (BID) | ORAL | Status: DC

## 2017-09-12 MED ORDER — TRAZODONE HCL 50 MG PO TABS
25.0000 mg | ORAL_TABLET | Freq: Every day | ORAL | Status: DC
  Administered 2017-09-12: 25 mg via ORAL
  Filled 2017-09-12: qty 1

## 2017-09-12 MED ORDER — ALBUTEROL SULFATE (2.5 MG/3ML) 0.083% IN NEBU: 2.5000 mg | INHALATION_SOLUTION | RESPIRATORY_TRACT | Status: DC | PRN

## 2017-09-12 MED ORDER — INSULIN ASPART 100 UNIT/ML ~~LOC~~ SOLN
0.0000 [IU] | Freq: Three times a day (TID) | SUBCUTANEOUS | Status: DC
  Administered 2017-09-12 – 2017-09-13 (×2): 1 [IU] via SUBCUTANEOUS
  Administered 2017-09-14: 2 [IU] via SUBCUTANEOUS

## 2017-09-12 MED ORDER — FUROSEMIDE 20 MG PO TABS
20.0000 mg | ORAL_TABLET | Freq: Every day | ORAL | Status: DC
  Administered 2017-09-12 – 2017-09-14 (×3): 20 mg via ORAL
  Filled 2017-09-12 (×3): qty 1

## 2017-09-12 MED ORDER — POTASSIUM CHLORIDE CRYS ER 20 MEQ PO TBCR: 20.0000 meq | EXTENDED_RELEASE_TABLET | Freq: Every day | ORAL | Status: DC

## 2017-09-12 MED ORDER — VITAMIN D (ERGOCALCIFEROL) 1.25 MG (50000 UNIT) PO CAPS: 50000.0000 [IU] | ORAL_CAPSULE | ORAL | Status: DC

## 2017-09-12 MED ORDER — TIOTROPIUM BROMIDE MONOHYDRATE 18 MCG IN CAPS
18.0000 ug | ORAL_CAPSULE | Freq: Every day | RESPIRATORY_TRACT | Status: DC
  Administered 2017-09-12 – 2017-09-14 (×2): 18 ug via RESPIRATORY_TRACT
  Filled 2017-09-12: qty 5

## 2017-09-12 MED ORDER — METOPROLOL TARTRATE 50 MG PO TABS
50.0000 mg | ORAL_TABLET | Freq: Every day | ORAL | Status: DC
  Administered 2017-09-12 – 2017-09-14 (×3): 50 mg via ORAL
  Filled 2017-09-12 (×3): qty 1

## 2017-09-12 MED ORDER — ALPRAZOLAM 0.5 MG PO TABS: 0.5000 mg | ORAL_TABLET | Freq: Three times a day (TID) | ORAL | Status: DC | PRN

## 2017-09-12 MED ORDER — GABAPENTIN 300 MG PO CAPS
300.0000 mg | ORAL_CAPSULE | Freq: Three times a day (TID) | ORAL | Status: DC
  Administered 2017-09-12: 300 mg via ORAL
  Filled 2017-09-12: qty 1

## 2017-09-12 MED ORDER — PANTOPRAZOLE SODIUM 40 MG PO TBEC
40.0000 mg | DELAYED_RELEASE_TABLET | Freq: Every day | ORAL | Status: DC
  Administered 2017-09-12 – 2017-09-14 (×3): 40 mg via ORAL
  Filled 2017-09-12 (×3): qty 1

## 2017-09-12 MED ORDER — POTASSIUM CHLORIDE CRYS ER 20 MEQ PO TBCR
20.0000 meq | EXTENDED_RELEASE_TABLET | Freq: Every day | ORAL | Status: DC
  Administered 2017-09-12 – 2017-09-14 (×3): 20 meq via ORAL
  Filled 2017-09-12 (×3): qty 1

## 2017-09-12 MED ORDER — ALPRAZOLAM 0.5 MG PO TABS: 0.5000 mg | ORAL_TABLET | Freq: Three times a day (TID) | ORAL | Status: DC

## 2017-09-12 MED ORDER — METOPROLOL TARTRATE 50 MG PO TABS
100.0000 mg | ORAL_TABLET | Freq: Every day | ORAL | Status: DC
  Administered 2017-09-12 – 2017-09-13 (×3): 100 mg via ORAL
  Filled 2017-09-12 (×3): qty 2

## 2017-09-12 MED ORDER — POTASSIUM CHLORIDE CRYS ER 20 MEQ PO TBCR
20.0000 meq | EXTENDED_RELEASE_TABLET | Freq: Once | ORAL | Status: AC
  Administered 2017-09-12: 20 meq via ORAL
  Filled 2017-09-12: qty 1

## 2017-09-12 MED ORDER — LACTULOSE 10 GM/15ML PO SOLN
40.0000 g | Freq: Three times a day (TID) | ORAL | Status: DC
  Administered 2017-09-12 – 2017-09-14 (×7): 40 g via ORAL
  Filled 2017-09-12 (×7): qty 60

## 2017-09-12 MED ORDER — ONDANSETRON HCL 4 MG/2ML IJ SOLN: 4.0000 mg | Freq: Four times a day (QID) | INTRAMUSCULAR | Status: DC | PRN

## 2017-09-12 MED ORDER — ACETAMINOPHEN 325 MG PO TABS
650.0000 mg | ORAL_TABLET | Freq: Four times a day (QID) | ORAL | Status: DC | PRN
  Administered 2017-09-12: 650 mg via ORAL
  Filled 2017-09-12: qty 2

## 2017-09-12 NOTE — H&P (Signed)
4        History and Physical    [name] [name] WHQ:[id number] DOB: Mar 26, 1961 DOA: 09/11/2017  PCP: Hermine Messick, MD   Patient coming from: Home.  I have personally briefly reviewed patient's old medical records in [facility]  Chief Complaint: AMS.  HPI: [name] [name] is a 57 y.o. 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  His wife told EMS that they were unable to drive and accompany the patient to the hospital.  She spoke briefly to Dr. Jeanell Sparrow on the phone.  Apparently no other symptoms like fever, respiratory distress, chest pain complains, nausea, emesis or diarrhea.  ED Course: Initial vital signs temperature 37.6C (99.7 F, pulse 74, respirations 17, blood pressure 150/76 mmHg and O2 sat 97% on room air.  He received lactulose 30 g p.o. in the emergency department.  His workup shows a urinalysis with large hemoglobinuria, proteinuria 19m/day.  Microscopic exam shows TNTC RBC/hpf, 6-30 WBC/hpf and no bacteria.  His CBC was normal, except for thrombocytopenia of 88.  Urine toxicology was positive for benzodiazepines (the patient takes alprazolam at home).  Alcohol level was less than 10 mg/dL.  Lipase was 54 U/L.  Ammonia level was 66 ummol/L.  CMP showed normal electrolytes and renal function.  Glucose is 112 bilirubin 2.9 mg/dL.  Albumin 3.3 g/dL.  AST was 44, ALT 17 and alkaline phosphatase of 142 U/L.  Chest radiograph and CT  of the head did not show any acute pathology.  Please see images and full radiology report for further detail.  Review of Systems: Unable to obtain due to AMS.   Past Medical History:  Diagnosis Date  . Anemia   . Anxiety   . Arthritis   . Bipolar 1 disorder ([place]   . Blind right eye   . Blood clots in brain    per patient  . Cardiomegaly   . Cataract    Right eye  . Cerebrovascular disease    Right vertebral artery  . Cholelithiasis    Asymptomatic  . Chronic back pain   . Cirrhosis ([place]    suspected NASH  . Cirrhosis ([facility]    diagnosed 07/2010 when presented with first episode of hepatic encephalopathy, afp on 416/13=4.8,  U/S on 12/14/11  liver stable, pt has received 2 hep A/B vaccines  . COPD (chronic obstructive pulmonary disease) ([place]   . Depression   . Diabetes mellitus   . Diverticulosis   . GERD (gastroesophageal reflux disease)   . Hard of hearing   . Hepatic encephalopathy ([facility]   . Hyperlipidemia   . Hypertension   . Mental retardation   . Seizures ([facility]   . Thrombocytopenia ([facility]     Past Surgical History:  Procedure Laterality Date  . CAST APPLICATION  16/12/9933  Procedure: MINOR CAST APPLICATION;  Surgeon: SArther Abbott MD;  Location: [facility];  Service: Orthopedics;  Laterality: Right;  no anesthesia , procedure room do not need or room !  . COLONOSCOPY  12/15/11   colonic divericulosis;poor prep, ACBE  recommended but has not  been done  . ear operations    . ESOPHAGOGASTRODUODENOSCOPY  02/02/2011   [place]-Normal esophagus.  No varices/Question mild portal gastropathy, extrinsic compression on the antrum, lesser curvature of uncertain significance, some minimally  nodular mucosa status post biopsy, patent pylorus, normal duodenum 1 and duodenum 2.  . ESOPHAGOGASTRODUODENOSCOPY  12/15/11   [place]k-->mild changes of portal gastropathy, gastric/duodenal erosions s/p bx  (reactive changes with mild chronic inflammation. No H.pylori  . FOOT SURGERY    .  ORIF ANKLE FRACTURE  08/27/2011   Procedure: OPEN REDUCTION INTERNAL FIXATION (ORIF) ANKLE FRACTURE;  Surgeon: [name], MD;  Location: [facility];  Service: Orthopedics;  Laterality: Right;  . TONSILLECTOMY       reports that he has been smoking cigarettes.  He has a 30.00 pack-year smoking history. he has never used smokeless tobacco. He reports that he does not drink alcohol or use drugs.  Allergies  Allergen Reactions  . Penicillins Rash    Has patient had a PCN reaction causing immediate rash, facial/tongue/throat swelling, SOB or lightheadedness with hypotension: Yes Has patient had a PCN reaction causing severe rash involving mucus membranes or skin necrosis: No Has patient had a PCN reaction that required hospitalization No Has patient had a PCN reaction occurring within the last 10 years: No If all of the above answers are "NO", then may proceed with Cephalosporin use.     Family History  Problem Relation Age of Onset  . Heart failure Mother   . Thyroid disease Mother   . Cancer Father   . Asthma Brother   . Heart attack Brother   . Cirrhosis Brother        EtOH  . Colon cancer Maternal Aunt     Prior to Admission medications   Medication Sig Start Date End Date Taking? Authorizing Provider  albuterol (PROVENTIL HFA;VENTOLIN HFA) 108 (90 Base) MCG/ACT inhaler Inhale 2 puffs into the lungs every 4 (four) hours as needed for wheezing or shortness of breath.   Yes [provider]  ALPRAZolam Duanne Moron) 0.5 MG tablet Take 0.5 mg by mouth 3 (three) times daily.    Yes [provider]  cholecalciferol (VITAMIN D) 1000 units tablet Take 1,000 Units by mouth daily.   Yes [provider]  ezetimibe (ZETIA) 10 MG tablet Take 10 mg by mouth daily.   Yes [provider]  furosemide (LASIX) 20 MG tablet Take 20 mg by mouth daily.   Yes [provider]  gabapentin (NEURONTIN) 300 MG capsule Take 300 mg by mouth 3 (three) times daily.   Yes  [provider]  lactulose (CHRONULAC) 10 GM/15ML solution Take 60 mLs (40 g total) by mouth 3 (three) times daily. Titrate for 3-4 bowel motions daily. 12/15/16  Yes Elgergawy, Silver Huguenin, MD  Magnesium 250 MG TABS Take 1 tablet by mouth daily.   Yes [provider]  metoprolol tartrate (LOPRESSOR) 50 MG tablet Take 50-100 mg by mouth 2 (two) times daily. 48m in the morning and 1033min the evening   Yes [provider]  Multiple Vitamin (MULTIVITAMIN WITH MINERALS) TABS Take 1 tablet by mouth daily.   Yes [provider]  omeprazole (PRILOSEC) 20 MG capsule Take 20 mg by mouth daily.   Yes [provider]  potassium chloride SA (K-DUR,KLOR-CON) 20 MEQ tablet Take 20 mEq by mouth daily.    Yes [provider]  QUEtiapine (SEROQUEL) 25 MG tablet Take 1 tablet (25 mg total) by mouth at bedtime. 12/31/16  Yes Samuella Cota, MD  rifaximin (XIFAXAN) 550 MG TABS tablet Take 1 tablet (550 mg total) by mouth 2 (two) times daily. 12/31/16  Yes Samuella Cota, MD  tiotropium (SPIRIVA) 18 MCG inhalation capsule Place 18 mcg into inhaler and inhale daily.   Yes [provider]  traZODone (DESYREL) 50 MG tablet Take 25 mg by mouth at bedtime.   Yes [provider]  vitamin B-12 (CYANOCOBALAMIN) 1000 MCG tablet Take 1,000 mcg by mouth daily.   Yes [provider]    Physical Exam: Vitals:   09/11/17 2200 09/11/17 2230 09/11/17 2300 09/11/17 2330  BP: (!) 179/84 (!) 166/83 (!) 184/84 (!) 162/88  Pulse: 74 71 71 77  Resp: 17 17 17 17   Temp: 99.7 F (37.6 C)     TempSrc: Rectal     SpO2: 97% 95% 96% 96%  Weight:        Constitutional: NAD, calm, comfortable Eyes: Right eye blindness, left pupil is reactive to light, lids and conjunctivae normal ENMT: Mucous membranes are moist. Posterior pharynx clear of any exudate or lesions. Neck: Normal, supple, no masses, no thyromegaly Respiratory: Clear to auscultation  bilaterally, no wheezing, no crackles. Normal respiratory effort. No accessory muscle use.  Cardiovascular: Regular rate and rhythm, no murmurs / rubs / gallops.  Trace lower extremities edema. 2+ pedal pulses. No carotid bruits.  Abdomen: Obese, soft, no tenderness, no masses palpated. No hepatosplenomegaly. Bowel sounds positive.  Musculoskeletal: no clubbing / cyanosis. Good ROM, no contractures. Normal muscle tone.  Skin: small ecchymosis areas and small hyperpigmented macules on upper extremities, otherwise no other significant rashes, lesions, ulcers.  Neurologic: Grossly nonfocal.  Moves all extremities. Psychiatric:  Alert and oriented x 2.  Disoriented to time and situation.    Labs on Admission: I have personally reviewed following labs and imaging studies  CBC: Recent Labs  Lab 09/11/17 2158  WBC 5.2  NEUTROABS 3.1  HGB 15.6  HCT 45.9  MCV 99.1  PLT 88*   Basic Metabolic Panel: Recent Labs  Lab 09/11/17 2158  NA 140  K 3.5  CL 108  CO2 23  GLUCOSE 112*  BUN 14  CREATININE 0.74  CALCIUM 9.4   GFR: CrCl cannot be calculated (Unknown ideal weight.). Liver Function Tests: Recent Labs  Lab 09/11/17 2158  AST 44*  ALT 17  ALKPHOS 142*  BILITOT 2.9*  PROT 6.9  ALBUMIN 3.3*   Recent Labs  Lab 09/11/17 2158  LIPASE 54*   Recent Labs  Lab 09/11/17 2159  AMMONIA 73*   Coagulation Profile: No results for input(s): INR, PROTIME in the last 168 hours. Cardiac Enzymes: No results for input(s): CKTOTAL, CKMB, CKMBINDEX, TROPONINI in the last 168 hours. BNP (last 3 results) No results for input(s): PROBNP in the last 8760 hours. HbA1C: No results for input(s): HGBA1C in the last 72 hours. CBG: No results for input(s): GLUCAP in the last 168 hours. Lipid Profile: No results for input(s): CHOL, HDL, LDLCALC, TRIG, CHOLHDL, LDLDIRECT in the last 72 hours. Thyroid Function Tests: No results for input(s): TSH, T4TOTAL, FREET4, T3FREE, THYROIDAB in the last  72 hours. Anemia Panel: No results for input(s): VITAMINB12, FOLATE, FERRITIN, TIBC, IRON, RETICCTPCT in the last 72 hours. Urine analysis:    Component Value Date/Time   COLORURINE YELLOW 12/21/2016 [place] 12/21/2016 1211   LABSPEC 1.012 12/21/2016 1211   PHURINE 6.0 12/21/2016 1211   GLUCOSEU NEGATIVE 12/21/2016 1211   HGBUR MODERATE (A) 12/21/2016  [place] 12/21/2016 [place] 12/21/2016 1211   PROTEINUR NEGATIVE 12/21/2016 1211   UROBILINOGEN 1.0 01/03/2014 1748   NITRITE NEGATIVE 12/21/2016 1211   LEUKOCYTESUR NEGATIVE 12/21/2016 1211    Radiological Exams on Admission: Ct Head Wo Contrast  Result Date: 09/11/2017 CLINICAL DATA:  Altered mental status, poor historian EXAM: CT HEAD WITHOUT CONTRAST TECHNIQUE: Contiguous axial images were obtained from the base of the skull through the vertex without intravenous contrast. COMPARISON:  MRI 12/27/2016, CT 12/22/2016, 12/21/2016, 12/13/2016 FINDINGS: Brain: No acute territorial infarction, hemorrhage or intracranial mass is visualized. Mild atrophy. Normal ventricle size. Vascular: No hyperdense vessels. Minimal calcification at the carotid siphons Skull: No fracture or suspicious abnormality. Sinuses/Orbits: Right this is bulbi. Minimal mucosal thickening in the ethmoid sinuses. Other: None IMPRESSION: No definite CT evidence for acute intracranial abnormality. Electronically Signed   By: Donavan Foil M.D.   On: 09/11/2017 22:52   Dg Chest Port 1 View  Result Date: 09/11/2017 CLINICAL DATA:  Fall. EXAM: PORTABLE CHEST 1 VIEW COMPARISON:  Dec 22, 2016 FINDINGS: Mild cardiomegaly. The hila and mediastinum are unchanged. No pulmonary nodules, masses, or focal infiltrates. No overt edema. IMPRESSION: No active disease. Electronically Signed   By: Dorise Bullion III M.D   On: 09/11/2017 22:09    EKG: Independently reviewed.   Assessment/Plan Principal Problem:   Altered mental  status   secondary to   Hyperammonemia (HCC)   Secondary to   Cirrhosis (HCC) Continue lactulose 40 g p.o. 3 times daily. Follow-up ammonia level. Hold Seroquel tonight. Resume Seroquel on Monday evening. Switch alprazolam to as needed only. Continue Xifaxan 550 mg p.o. twice daily.  Active Problems:   COPD (chronic obstructive pulmonary disease) (HCC) Supplemental oxygen bronchodilators as needed.    DM (diabetes mellitus) ([place]) Last hemoglobin A1c 5.9% in April 2017 Carbohydrate modified diet. CBG monitoring with regular insulin sliding scale.    HTN (hypertension) Continue metoprolol 50 mg p.o. in the morning and 100 mg p.o. at bedtime. Monitor blood pressure and heart rate.    Depression Continue trazodone 25 mg p.o. at bedtime. Resume Seroquel on Monday evening.    GERD (gastroesophageal reflux disease) Protonix 40 mg p.o. daily.    Thrombocytopenia (HCC) Monitor platelets.    Anxiety Changing scheduled alprazolam 0.25 mg p.o.to 3 times daily as needed.    DVT prophylaxis: SCDs. Code Status: Full code. Family Communication: None at bedside. Disposition Plan: Observation for lactulose treatment. Consults called:  Admission status: Observation/telemetry.   Reubin Milan MD [facility] Pager [phone].  If 7PM-7AM, please contact night-coverage [email or web address] Password [facility]  09/12/2017, 12:04 AM

## 2017-09-12 NOTE — Progress Notes (Signed)
Pt admitted from ED, kept stating that he had to pee. Condom catheter placed with no success. NT bladder scanned pt with >300 in bladder. Dr. Olevia Bowens paged and made aware.  New order to place foley catheter. Catheter placed with no problems, urine returned. Pt educated. Will continue to monitor pt

## 2017-09-12 NOTE — Progress Notes (Addendum)
PROGRESS NOTE                                                                                                                                                                                                             Patient Demographics:    [name] [name], is a 57 y.o. male, DOB - [birth year], JGO:[id number]  Admit date - 09/11/2017   Admitting Physician Reubin Milan, MD  Outpatient Primary MD for the patient is Hermine Messick, MD  LOS - 0  Outpatient Specialists: None  Chief Complaint  Patient presents with  . 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   Subjective:   Patient poorly  communicative and not oriented   Assessment  & Plan :    Principal Problem: Acute hepatic encephalopathy Continue lactulose 40 g 3 times a day, titrated to at least 3 loose bowel movements.  Continue rifaximin. Hold Xanax, Seroquel and trazodone until mental status improves.  Active Problems:   COPD (chronic obstructive pulmonary disease) (HCC) Stable. Bronchodilators as needed.    DM (diabetes mellitus) ([place]), controlled A1c of 5.9. Monitor on sliding scale coverage.  Essential hypertension Continue metoprolol.  Depression Hold Seroquel and trazodone until mental status improves.  GERD Continue Protonix     Thrombocytopenia ([place]) Secondary to cirrhosis. Monitor for now.    Hypokalemia Being replenished.  Acute urinary retention Foley placed on admission. Will monitor.      Code Status : Full code  Family Communication  :None at bedside. Spoke with wife who's involved in care.  Disposition Plan  : Home  once improved, possibly tomorrow  Barriers For Discharge : Active symptoms  Consults  :  None  Procedures  : CT head  DVT Prophylaxis  :   SCDs   Lab Results  Component Value Date   PLT 78 (L) 09/12/2017    Antibiotics  :    Anti-infectives (From admission, onward)   Start     Dose/Rate Route Frequency Ordered Stop   09/12/17 0115  rifaximin (XIFAXAN) tablet 550 mg     550 mg Oral 2 times daily  09/12/17 0109          Objective:   Vitals:   09/12/17 0111 09/12/17 0149 09/12/17 0500 09/12/17 0840  BP: (!) 159/63  (!) 122/56 (!) 144/76  Pulse: 83  70 66  Resp: 16  16   Temp: 98.3 F (36.8 C)  98.6 F (37 C)   TempSrc: Oral  Oral   SpO2: 96%  97%   Weight: 104.4 kg (230 lb 2.6 oz)     Height:  5' 8"  (1.727 m)      Wt Readings from Last 3 Encounters:  09/12/17 104.4 kg (230 lb 2.6 oz)  12/30/16 98.2 kg (216 lb 7.9 oz)  12/13/16 113.4 kg (250 lb)     Intake/Output Summary (Last 24 hours) at 09/12/2017 0916 Last data filed at 09/12/2017 0500 Gross per 24 hour  Intake 240 ml  Output 400 ml  Net -160 ml     Physical Exam  Gen: not in distress,flat affect  HEENT: no pallor, moist mucosa, supple neck Chest: clear b/l, no added sounds CVS: N S1&S2, no murmurs, rubs or gallop GI: soft, NT, ND, BS+, foley+ Musculoskeletal: warm, trace edema CNS: AAOX0, flapping tremors    Data Review:    CBC Recent Labs  Lab 09/11/17 2158 09/12/17 0417  WBC 5.2 4.7  HGB 15.6 14.3  HCT 45.9 40.0  PLT 88* 78*  MCV 99.1 97.3  MCH 33.7 34.8*  MCHC 34.0 35.8  RDW 14.3 14.3  LYMPHSABS 1.3 1.5  MONOABS 0.6 0.6  EOSABS 0.2 0.2  BASOSABS 0.0 0.0    Chemistries  Recent Labs  Lab 09/11/17 2158 09/12/17 0417  NA 140 141  K 3.5 3.0*  CL 108 110  CO2 23 22  GLUCOSE 112* 112*  BUN 14 13  CREATININE 0.74 0.59*  CALCIUM 9.4 9.1  MG  --  1.7  AST 44* 39  ALT 17 17  ALKPHOS 142* 126  BILITOT 2.9* 2.9*    ------------------------------------------------------------------------------------------------------------------ No results for input(s): CHOL, HDL, LDLCALC, TRIG, CHOLHDL, LDLDIRECT in the last 72 hours.  Lab Results  Component Value Date   HGBA1C 5.9 (H) 12/07/2015   ------------------------------------------------------------------------------------------------------------------ No results for input(s): TSH, T4TOTAL, T3FREE, THYROIDAB in the last 72 hours.  Invalid input(s): FREET3 ------------------------------------------------------------------------------------------------------------------ No results for input(s): VITAMINB12, FOLATE, FERRITIN, TIBC, IRON, RETICCTPCT in the last 72 hours.  Coagulation profile No results for input(s): INR, PROTIME in the last 168 hours.  No results for input(s): DDIMER in the last 72 hours.  Cardiac Enzymes No results for input(s): CKMB, TROPONINI, MYOGLOBIN in the last 168 hours.  Invalid input(s): CK ------------------------------------------------------------------------------------------------------------------ No results found for: BNP  Inpatient Medications  Scheduled Meds: . cholecalciferol  1,000 Units Oral Daily  . ezetimibe  10 mg Oral Daily  . furosemide  20 mg Oral Daily  . gabapentin  300 mg Oral TID  . insulin aspart  0-9 Units Subcutaneous TID WC  . lactulose  40 g Oral TID  . metoprolol tartrate  100 mg Oral QHS  . metoprolol tartrate  50 mg Oral Daily  . pantoprazole  40 mg Oral Daily  . potassium chloride SA  20 mEq Oral Daily  . QUEtiapine  25 mg Oral QHS  . rifaximin  550 mg Oral BID  . tiotropium  18 mcg Inhalation Daily  . traZODone  25 mg Oral QHS   Continuous Infusions: PRN Meds:.albuterol, ALPRAZolam, ondansetron **OR** ondansetron (ZOFRAN) IV  Micro Results No results found for this or any previous visit (  from the past 240 hour(s)).  Radiology Reports Ct Head Wo Contrast  Result Date:  09/11/2017 CLINICAL DATA:  Altered mental status, poor historian EXAM: CT HEAD WITHOUT CONTRAST TECHNIQUE: Contiguous axial images were obtained from the base of the skull through the vertex without intravenous contrast. COMPARISON:  MRI 12/27/2016, CT 12/22/2016, 12/21/2016, 12/13/2016 FINDINGS: Brain: No acute territorial infarction, hemorrhage or intracranial mass is visualized. Mild atrophy. Normal ventricle size. Vascular: No hyperdense vessels. Minimal calcification at the carotid siphons Skull: No fracture or suspicious abnormality. Sinuses/Orbits: Right this is bulbi. Minimal mucosal thickening in the ethmoid sinuses. Other: None IMPRESSION: No definite CT evidence for acute intracranial abnormality. Electronically Signed   By: Donavan Foil M.D.   On: 09/11/2017 22:52   Dg Chest Port 1 View  Result Date: 09/11/2017 CLINICAL DATA:  Fall. EXAM: PORTABLE CHEST 1 VIEW COMPARISON:  Dec 22, 2016 FINDINGS: Mild cardiomegaly. The hila and mediastinum are unchanged. No pulmonary nodules, masses, or focal infiltrates. No overt edema. IMPRESSION: No active disease. Electronically Signed   By: Dorise Bullion III M.D   On: 09/11/2017 22:09    Time Spent in minutes  20   Whitney Hillegass M.D on 09/12/2017 at 9:16 AM  Between 7am to 7pm - Pager - [phone]  After 7pm go to [email or web address] - password [facility]  [facility] -  Office  [phone]

## 2017-09-13 DIAGNOSIS — J449 Chronic obstructive pulmonary disease, unspecified: Secondary | ICD-10-CM

## 2017-09-13 DIAGNOSIS — R748 Abnormal levels of other serum enzymes: Secondary | ICD-10-CM

## 2017-09-13 DIAGNOSIS — R410 Disorientation, unspecified: Secondary | ICD-10-CM

## 2017-09-13 DIAGNOSIS — K7469 Other cirrhosis of liver: Secondary | ICD-10-CM

## 2017-09-13 DIAGNOSIS — F419 Anxiety disorder, unspecified: Secondary | ICD-10-CM

## 2017-09-13 LAB — BASIC METABOLIC PANEL
Anion gap: 8 (ref 5–15)
BUN: 14 mg/dL (ref 6–20)
CALCIUM: 8.6 mg/dL — AB (ref 8.9–10.3)
CO2: 24 mmol/L (ref 22–32)
Chloride: 107 mmol/L (ref 101–111)
Creatinine, Ser: 0.62 mg/dL (ref 0.61–1.24)
GFR calc non Af Amer: >60 mL/min (ref >60)
Glucose, Bld: 97 mg/dL (ref 65–99)
Potassium: 3.1 mmol/L — ABNORMAL LOW (ref 3.5–5.1)
Sodium: 139 mmol/L (ref 135–145)

## 2017-09-13 LAB — GLUCOSE, CAPILLARY
GLUCOSE-CAPILLARY: 127 mg/dL — AB (ref 65–99)
Glucose-Capillary: 79 mg/dL (ref 65–99)
Glucose-Capillary: 148 mg/dL — ABNORMAL HIGH (ref 65–99)
Glucose-Capillary: 106 mg/dL — ABNORMAL HIGH (ref 65–99)

## 2017-09-13 NOTE — Plan of Care (Signed)
  Activity: Risk for activity intolerance will decrease 09/13/2017 0936 - Progressing by Berton Bon, RN   Coping: Level of anxiety will decrease 09/13/2017 0936 - Progressing by Berton Bon, RN   Safety: Ability to remain free from injury will improve 09/13/2017 0936 - Progressing by Berton Bon, RN   Skin Integrity: Risk for impaired skin integrity will decrease 09/13/2017 0936 - Progressing by Berton Bon, RN   Nutrition: Adequate nutrition will be maintained 09/13/2017 0936 - Completed/Met by Berton Bon, RN

## 2017-09-13 NOTE — Progress Notes (Signed)
PROGRESS NOTE                                                                                                                                                                                                             Patient Demographics:    [name] [name], is a 57 y.o. male, DOB - January 31, 1961, DGL:[id number]  Admit date - 09/11/2017   Admitting Physician Reubin Milan, MD  Outpatient Primary MD for the patient is Hermine Messick, MD  LOS - 1  Outpatient Specialists: None  Chief Complaint  Patient presents with  . 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   Subjective:   Was seen and examined this morning, stable, no acute distress.  Nursing staff reporting Has not had a bowel movement overnight or this a.m. Yet. Poor p.o. intake portable nursing staff   Assessment  & Plan :    Principal Problem: Acute hepatic encephalopathy - Patient at baseline now We will continue continue lactulose 40 g 3 times a day, titrated to at least 3 loose bowel movements.  Continue rifaximin. Hold Xanax, Seroquel and trazodone -rotation at baseline now, if any behavioral disturbances anticipating restarting Seroquel as needed Xanax  Active Problems:   COPD (chronic obstructive pulmonary disease) (HCC) Stable. Bronchodilators as needed.    DM (diabetes mellitus) ([place]), controlled A1c of 5.9. Monitor on sliding scale coverage.  Essential hypertension Continue metoprolol.  Depression Hold Seroquel and trazodone until mental status improves.  GERD Continue Protonix    Thrombocytopenia ([place]) Secondary to cirrhosis. Monitor  for now.    Hypokalemia Being replenished.  Acute urinary retention Foley placed on admission. Will monitor.  Code Status : Full code  Family Communication  :None at bedside. Spoke with wife who's involved in care.  Disposition Plan: Home once improved, stable possibly in a.m. Barriers For Discharge : Active symptoms  Consults  :  None  Procedures  : CT head  DVT Prophylaxis  :   SCDs   Lab Results  Component Value Date   PLT 78 (L) 09/12/2017    Antibiotics  :  Anti-infectives (From admission, onward)   Start     Dose/Rate Route Frequency Ordered Stop   09/12/17 0115  rifaximin (XIFAXAN) tablet 550 mg     550 mg Oral 2 times daily 09/12/17 0109          Objective:   Vitals:   09/12/17 2100 09/12/17 2230 09/13/17 0500 09/13/17 0925  BP:  129/62 (!) 107/57 133/62  Pulse: 69  (!) 55   Resp:      Temp:  98.2 F (36.8 C) 98.1 F (36.7 C)   TempSrc:  Oral Oral   SpO2: 95%  97%   Weight:      Height:        Wt Readings from Last 3 Encounters:  09/12/17 104.4 kg (230 lb 2.6 oz)  12/30/16 98.2 kg (216 lb 7.9 oz)  12/13/16 113.4 kg (250 lb)     Intake/Output Summary (Last 24 hours) at 09/13/2017 1156 Last data filed at 09/13/2017 0522 Gross per 24 hour  Intake 600 ml  Output 1700 ml  Net -1100 ml     Physical Exam  Gen: not in distress,flat affect  HEENT: no pallor, moist mucosa, supple neck Chest: clear b/l, no added sounds CVS: N S1&S2, no murmurs, rubs or gallop GI: soft, NT, ND, BS+, foley+ Musculoskeletal: warm, trace edema CNS: AAOX0, flapping tremors    Data Review:    CBC Recent Labs  Lab 09/11/17 2158 09/12/17 0417  WBC 5.2 4.7  HGB 15.6 14.3  HCT 45.9 40.0  PLT 88* 78*  MCV 99.1 97.3  MCH 33.7 34.8*  MCHC 34.0 35.8  RDW 14.3 14.3  LYMPHSABS 1.3 1.5  MONOABS 0.6 0.6  EOSABS 0.2 0.2  BASOSABS 0.0 0.0    Chemistries  Recent Labs  Lab 09/11/17 2158 09/12/17 0417 09/13/17 0710  NA 140 141 139  K 3.5 3.0* 3.1*  CL  108 110 107  CO2 23 22 24   GLUCOSE 112* 112* 97  BUN 14 13 14   CREATININE 0.74 0.59* 0.62  CALCIUM 9.4 9.1 8.6*  MG  --  1.7  --   AST 44* 39  --   ALT 17 17  --   ALKPHOS 142* 126  --   BILITOT 2.9* 2.9*  --    ------------------------------------------------------------------------------------------------------------------ No results for input(s): CHOL, HDL, LDLCALC, TRIG, CHOLHDL, LDLDIRECT in the last 72 hours.  Lab Results  Component Value Date   HGBA1C 5.9 (H) 12/07/2015   ------------------------------------------------------------------------------------------------------------------ No results for input(s): TSH, T4TOTAL, T3FREE, THYROIDAB in the last 72 hours.  Invalid input(s): FREET3 ------------------------------------------------------------------------------------------------------------------ No results for input(s): VITAMINB12, FOLATE, FERRITIN, TIBC, IRON, RETICCTPCT in the last 72 hours.  Coagulation profile No results for input(s): INR, PROTIME in the last 168 hours.  No results for input(s): DDIMER in the last 72 hours.  Cardiac Enzymes No results for input(s): CKMB, TROPONINI, MYOGLOBIN in the last 168 hours.  Invalid input(s): CK ------------------------------------------------------------------------------------------------------------------ No results found for: BNP  Inpatient Medications  Scheduled Meds: . cholecalciferol  1,000 Units Oral Daily  . ezetimibe  10 mg Oral Daily  . furosemide  20 mg Oral Daily  . insulin aspart  0-9 Units Subcutaneous TID WC  . lactulose  40 g Oral TID  . metoprolol tartrate  100 mg Oral QHS  . metoprolol tartrate  50 mg Oral Daily  . pantoprazole  40 mg Oral Daily  . potassium chloride SA  20 mEq Oral Daily  . rifaximin  550 mg Oral BID  . tiotropium  18 mcg Inhalation Daily   Continuous Infusions: PRN Meds:.acetaminophen, albuterol, ondansetron **OR** ondansetron (ZOFRAN) IV  Micro Results No results  found for this or any previous visit (from the past 240 hour(s)).  Radiology Reports Ct Head Wo Contrast  Result Date: 09/11/2017 CLINICAL DATA:  Altered mental status, poor historian EXAM: CT HEAD WITHOUT CONTRAST TECHNIQUE: Contiguous axial images were obtained from the base of the skull through the vertex without intravenous contrast. COMPARISON:  MRI 12/27/2016, CT 12/22/2016, 12/21/2016, 12/13/2016 FINDINGS: Brain: No acute territorial infarction, hemorrhage or intracranial mass is visualized. Mild atrophy. Normal ventricle size. Vascular: No hyperdense vessels. Minimal calcification at the carotid siphons Skull: No fracture or suspicious abnormality. Sinuses/Orbits: Right this is bulbi. Minimal mucosal thickening in the ethmoid sinuses. Other: None IMPRESSION: No definite CT evidence for acute intracranial abnormality. Electronically Signed   By: Donavan Foil M.D.   On: 09/11/2017 22:52   Dg Chest Port 1 View  Result Date: 09/11/2017 CLINICAL DATA:  Fall. EXAM: PORTABLE CHEST 1 VIEW COMPARISON:  Dec 22, 2016 FINDINGS: Mild cardiomegaly. The hila and mediastinum are unchanged. No pulmonary nodules, masses, or focal infiltrates. No overt edema. IMPRESSION: No active disease. Electronically Signed   By: Dorise Bullion III M.D   On: 09/11/2017 22:09    Time Spent in minutes   > 20   Deatra James M.D on 09/13/2017 at 11:56 AM  Between 7am to 7pm - Pager - [phone]  After 7pm go to [email or web address] - password [facility]  [facility] -  Office  [phone]

## 2017-09-14 ENCOUNTER — Encounter (HOSPITAL_COMMUNITY): Payer: Self-pay | Admitting: Primary Care

## 2017-09-14 DIAGNOSIS — Z7189 Other specified counseling: Secondary | ICD-10-CM

## 2017-09-14 DIAGNOSIS — Z515 Encounter for palliative care: Secondary | ICD-10-CM

## 2017-09-14 DIAGNOSIS — F329 Major depressive disorder, single episode, unspecified: Secondary | ICD-10-CM

## 2017-09-14 DIAGNOSIS — E13638 Other specified diabetes mellitus with other oral complications: Secondary | ICD-10-CM

## 2017-09-14 LAB — GLUCOSE, CAPILLARY
Glucose-Capillary: 92 mg/dL (ref 65–99)
Glucose-Capillary: 160 mg/dL — ABNORMAL HIGH (ref 65–99)

## 2017-09-14 MED ORDER — POTASSIUM CHLORIDE CRYS ER 20 MEQ PO TBCR: 40.0000 meq | EXTENDED_RELEASE_TABLET | Freq: Every day | ORAL | Status: DC

## 2017-09-14 NOTE — Consult Note (Signed)
Consultation Note Date: 09/14/2017   Patient Name: [name] [name]  DOB: 04/01/1961  MRN: [id number]  Age / Sex: 57 y.o., male  PCP: [name] Messick, MD Referring Physician: Deatra James, MD  Reason for Consultation: Establishing goals of care, Hospice Evaluation and Psychosocial/spiritual support  HPI/Patient Profile: 57 y.o. male  with past medical history of NASH, diabetes, COPD, high blood pressure, GERD, anxiety and depression, mild MR admitted on 09/11/2017 with altered mental status related to hepatic encephalopathy.   Clinical Assessment and Goals of Care: [name] [name] is sitting up in the [place] chair in his room. He greets me making and keeping eye contact. He is calm, cooperative, and pleasant. There is no family at bedside at this time. We talk about what is next. [name] [name] states that he would like to go home with the benefits of hospice of [name] [name]. He states that he would like for his life to look look and be the way he wants it to be. He states that he does not want to suffer. He states that his wife is also agreeable. We talk about life support. He shares with me that he would not want life-support, no CPR, no intubation. He agrees for me to call his wife, [name] [name].  Call to wife [name] [name]. We talk about the benefits of hospice and home service. She states that she was requesting hospice [name] [name]. She requests that they call her today if possible, stating something about a rent increase. She also talk about code status. [name] [name] verifies that her husband would like a "allow a natural death". She states that paperwork should be on file with the [name]. No further questions at this time.  Healthcare power of attorney NEXT OF KIN - [name] [name] states that his wife is his Ambulance person.   SUMMARY OF RECOMMENDATIONS   home with the benefits of hospice of  [name] [name] in-home services. Continue to treat the treatable.  Code Status/Advance Care Planning:  DNR - code status verify with patient and wife. Wife states that we should have a copy of this on file.  Symptom Management:   per hospitalist, no additional needs at this time.  Palliative Prophylaxis:   Frequent Pain Assessment  Additional Recommendations (Limitations, Scope, Preferences):  Treat the treatable but now CPR, no intubation.  Psycho-social/Spiritual:   Desire for further Chaplaincy support:no  Additional Recommendations: Caregiving  Support/Resources and Education on Hospice  Prognosis:   < 12 months, 6 to 12 months, would not be surprising based on functional status, severity of illness.   Discharge Planning: Home with the benefits of hospice of [name] [name]      Primary Diagnoses: Present on Admission: . Altered mental status . COPD (chronic obstructive pulmonary disease) ([place]) . HTN (hypertension) . GERD (gastroesophageal reflux disease) . Cirrhosis ([place]) . Thrombocytopenia ([place]) . Depression . Anxiety . Hyperammonemia ([place]) . Hepatic encephalopathy ([place])   I have reviewed the medical record, interviewed the patient and family, and examined the patient. The following aspects  are pertinent.  Past Medical History:  Diagnosis Date  . Anemia   . Anxiety   . Arthritis   . Bipolar 1 disorder ([place])   . Blind right eye   . Blood clots in brain    per patient  . Cardiomegaly   . Cataract    Right eye  . Cerebrovascular disease    Right vertebral artery  . Cholelithiasis    Asymptomatic  . Chronic back pain   . Cirrhosis ([place])    suspected NASH  . Cirrhosis ([place])    diagnosed 07/2010 when presented with first episode of hepatic encephalopathy, afp on 416/13=4.8,  U/S on 12/14/11  liver stable, pt has received 2 hep A/B vaccines  . COPD (chronic obstructive pulmonary disease) ([place])   . Depression   . Diabetes mellitus   .  Diverticulosis   . GERD (gastroesophageal reflux disease)   . Hard of hearing   . Hepatic encephalopathy ([place])   . Hyperlipidemia   . Hypertension   . Mental retardation   . Seizures ([place])   . Thrombocytopenia ([place])    Social History   Socioeconomic History  . Marital status: Married    Spouse name: None  . Number of children: 0  . Years of education: None  . Highest education level: None  Social Needs  . Financial resource strain: None  . Food insecurity - worry: None  . Food insecurity - inability: None  . Transportation needs - medical: None  . Transportation needs - non-medical: None  Occupational History  . Occupation: disabled    Fish farm manager: UNEMPLOYED  Tobacco Use  . Smoking status: Current Every Day Smoker    Packs/day: 1.00    Years: 30.00    Pack years: 30.00    Types: Cigarettes  . Smokeless tobacco: Never Used  . Tobacco comment: only smokes about 1-2 a day  Substance and Sexual Activity  . Alcohol use: No  . Drug use: No  . Sexual activity: None  Other Topics Concern  . None  Social History Narrative  . None   Family History  Problem Relation Age of Onset  . Heart failure Mother   . Thyroid disease Mother   . Cancer Father   . Asthma Brother   . Heart attack Brother   . Cirrhosis Brother        EtOH  . Colon cancer Maternal Aunt    Scheduled Meds: . cholecalciferol  1,000 Units Oral Daily  . ezetimibe  10 mg Oral Daily  . furosemide  20 mg Oral Daily  . insulin aspart  0-9 Units Subcutaneous TID WC  . lactulose  40 g Oral TID  . metoprolol tartrate  100 mg Oral QHS  . metoprolol tartrate  50 mg Oral Daily  . pantoprazole  40 mg Oral Daily  . [START ON 09/15/2017] potassium chloride SA  40 mEq Oral Daily  . rifaximin  550 mg Oral BID  . tiotropium  18 mcg Inhalation Daily   Continuous Infusions: PRN Meds:.acetaminophen, albuterol, ondansetron **OR** ondansetron (ZOFRAN) IV Medications Prior to Admission:  Prior to Admission medications     Medication Sig Start Date End Date Taking? Authorizing Provider  albuterol (PROVENTIL HFA;VENTOLIN HFA) 108 (90 Base) MCG/ACT inhaler Inhale 2 puffs into the lungs every 4 (four) hours as needed for wheezing or shortness of breath.   Yes [provider]  ALPRAZolam Duanne Moron) 0.5 MG tablet Take 0.5 mg by mouth 3 (three) times daily.    Yes  [provider]  cholecalciferol (VITAMIN D) 1000 units tablet Take 1,000 Units by mouth daily.   Yes [provider]  ezetimibe (ZETIA) 10 MG tablet Take 10 mg by mouth daily.   Yes [provider]  furosemide (LASIX) 20 MG tablet Take 20 mg by mouth daily.   Yes [provider]  gabapentin (NEURONTIN) 300 MG capsule Take 300 mg by mouth 3 (three) times daily.   Yes [provider]  lactulose (CHRONULAC) 10 GM/15ML solution Take 60 mLs (40 g total) by mouth 3 (three) times daily. Titrate for 3-4 bowel motions daily. 12/15/16  Yes Elgergawy, Silver Huguenin, MD  Magnesium 250 MG TABS Take 1 tablet by mouth daily.   Yes [provider]  metoprolol tartrate (LOPRESSOR) 50 MG tablet Take 50-100 mg by mouth 2 (two) times daily. 50mg  in the morning and 100mg  in the evening   Yes [provider]  Multiple Vitamin (MULTIVITAMIN WITH MINERALS) TABS Take 1 tablet by mouth daily.   Yes [provider]  omeprazole (PRILOSEC) 20 MG capsule Take 20 mg by mouth daily.   Yes [provider]  potassium chloride SA (K-DUR,KLOR-CON) 20 MEQ tablet Take 20 mEq by mouth daily.    Yes [provider]  QUEtiapine (SEROQUEL) 25 MG tablet Take 1 tablet (25 mg total) by mouth at bedtime. 12/31/16  Yes Samuella Cota, MD  rifaximin (XIFAXAN) 550 MG TABS tablet Take 1 tablet (550 mg total) by mouth 2 (two) times daily. 12/31/16  Yes Samuella Cota, MD  tiotropium (SPIRIVA) 18 MCG inhalation capsule Place 18 mcg into inhaler and inhale daily.   Yes [provider]  traZODone (DESYREL) 50 MG  tablet Take 25 mg by mouth at bedtime.   Yes [provider]  vitamin B-12 (CYANOCOBALAMIN) 1000 MCG tablet Take 1,000 mcg by mouth daily.   Yes [provider]   Allergies  Allergen Reactions  . Penicillins Rash    Has patient had a PCN reaction causing immediate rash, facial/tongue/throat swelling, SOB or lightheadedness with hypotension: Yes Has patient had a PCN reaction causing severe rash involving mucus membranes or skin necrosis: No Has patient had a PCN reaction that required hospitalization No Has patient had a PCN reaction occurring within the last 10 years: No If all of the above answers are "NO", then may proceed with Cephalosporin use.    Review of Systems  Unable to perform ROS: Mental status change    Physical Exam  Constitutional: He is oriented to person, place, and time. No distress.  Appears chronically ill, calm and cooperative, pleasant.  HENT:  Head: Normocephalic and atraumatic.  Cardiovascular: Normal rate and regular rhythm.  Pulmonary/Chest: No respiratory distress.  Abdominal: Soft. He exhibits no distension.  Musculoskeletal: He exhibits no edema.  Neurological: He is alert and oriented to person, place, and time.  Skin: Skin is warm and dry.  Psychiatric: He has a normal mood and affect. Judgment and thought content normal.  Nursing note and vitals reviewed.   Vital Signs: BP 139/86 (BP Location: Right Arm)   Pulse 76   Temp 98.4 F (36.9 C) (Oral)   Resp 16   Ht 5\' 8"  (1.727 m)   Wt 104.4 kg (230 lb 2.6 oz)   SpO2 97%   BMI 35.00 kg/m  Pain Assessment: No/denies pain   Pain Score: 0-No pain   SpO2: SpO2: 97 % O2 Device:SpO2: 97 % O2 Flow Rate: .   IO: Intake/output summary:  Intake/Output Summary (Last 24 hours) at 09/14/2017 1155 Last data filed at 09/14/2017 0407 Gross per 24 hour  Intake 600 ml  Output 1750 ml  Net -1150 ml    LBM: Last BM Date: 09/13/17 Baseline Weight: Weight: 98 kg (216 lb) Most  recent weight: Weight: 104.4 kg (230 lb 2.6 oz)     Palliative Assessment/Data:   Flowsheet Rows     Most Recent Value  Intake Tab  Referral Department  Hospitalist  Unit at Time of Referral  Cardiac/Telemetry Unit  Palliative Care Primary Diagnosis  -- [name] ]  Date Notified  09/13/17  Palliative Care Type  New Palliative care  Reason for referral  Clarify Goals of Care, Counsel Regarding Hospice, Psychosocial or Spiritual support  Date of Admission  09/11/17  Date first seen by Palliative Care  09/14/17  # of days Palliative referral response time  1 Day(s)  # of days IP prior to Palliative referral  2  Clinical Assessment  Palliative Performance Scale Score  60%  Pain Max last 24 hours  Not able to report  Pain Min Last 24 hours  Not able to report  Dyspnea Max Last 24 Hours  Not able to report  Dyspnea Min Last 24 hours  Not able to report  Psychosocial & Spiritual Assessment  Palliative Care Outcomes  Patient/Family meeting held?  Yes  Who was at the meeting?  patient at bedside, wife via phone  [place] regarding hospice, Provided end of life care assistance, Clarified goals of care  Patient/Family wishes: Interventions discontinued/not started   Mechanical Ventilation      Time In: 1030 Time Out: 1100 Time Total: 30 minutes Greater than 50%  of this time was spent counseling and coordinating care related to the above assessment and plan.  Signed by: Drue Novel, NP   Please contact Palliative Medicine Team phone at [phone] for questions and concerns.  For individual provider: See Shea Sircy

## 2017-09-14 NOTE — Progress Notes (Signed)
Waiting on patient's wife to go over D/C paperwork.

## 2017-09-14 NOTE — Discharge Summary (Signed)
Physician Discharge Summary      Patient: [name] [name]                   Admit date: 09/11/2017   DOB: April 29, 1961             Discharge date:09/14/2017/11:26 AM AQT:[id number]                           PCP: [name] Messick, MD Recommendations for Outpatient Follow-up:   1.       Please follow-up with your gastroenterologist/liver specialist in 1-3 weeks 2. Please follow-up with your primary care physician within 1-2 weeks.   Discharge Condition: Stable  CODE STATUS:  Full code Diet recommendation:  Regular healthy diet  ----------------------------------------------------------------------------------------------------------------------  Discharge Diagnoses:   Principal Problem:   Altered mental status Active Problems:   COPD (chronic obstructive pulmonary disease) (HCC)   DM (diabetes mellitus) (HCC)   HTN (hypertension)   Cirrhosis (HCC)   Depression   GERD (gastroesophageal reflux disease)   Thrombocytopenia (HCC)   Hepatic encephalopathy (HCC)   Hyperammonemia (HCC)   Anxiety   History of present illness :  HPI: [name] [name] is a 57 y.o. 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  His wife told EMS that they were unable to drive and accompany the patient to the hospital.  She spoke briefly to Dr. Jeanell Sparrow on the phone.  Apparently no other symptoms like fever, respiratory distress, chest pain complains, nausea, emesis  or diarrhea.   Hospital course / Brief Summary:  Acute hepatic encephalopathy - Patient at baseline now continue continue lactulose 40 g 3 times a day, titrated to at least 3 loose bowel movements. Continue rifaximin. D/c Xanax, and trazodone -mentation at baseline Continue nightly Seroquel     COPD (chronic obstructive pulmonary disease) (HCC) Stable.  PRN bronchodilators albuterol   DM (diabetes mellitus) ([place]), controlled A1c of 5.9.  Continue diabetic diet, no medication  Essential hypertension Continue metoprolol.  At bedtime  Depression DC trazodone, and Xanax,  continue Seroquel   GERD Patient remains asymptomatic recommending DC PPI  Thrombocytopenia ([place]) Secondary to cirrhosis.,  Changes no signs of bleeding, platelets are 78  Hypokalemia - Was monitored and replaced  Acute urinary retention Foley placed on admission. Will monitor.  Code Status : Full code  Family Communication: Plan of care has been discussed with the patient and his wife over the phone in detail.  She would like more information regarding his prognosis, palliative care and hospice -we have deferred a final determination to PCP and liver specialist regarding his prognosis -we do agree to hospice evaluation and services   Disposition Plan: Home -possibly hospice  Consultations:   Social worker/palliative  Procedures: No admission procedures for hospital encounter.    ----------------------------------------------------------------------------------------------------------------------  Discharge Instructions:   Discharge Instructions    Activity as tolerated - No restrictions   Complete by:  As directed    Call MD for:  difficulty breathing, headache or visual disturbances   Complete by:  As directed  Call MD for:  redness, tenderness, or signs of infection (pain, swelling, redness, odor or green/yellow discharge around incision site)   Complete by:  As directed     Call MD for:  temperature >100.4   Complete by:  As directed    Diet - low sodium heart healthy   Complete by:  As directed    Discharge instructions   Complete by:  As directed    Please follow-up with the palliative care team.  Please follow with the PCP and your gastroenterologist regarding reassessing prognosis,  And changing current medications   Increase activity slowly   Complete by:  As directed        Medication List    STOP taking these medications   ALPRAZolam 0.5 MG tablet Commonly known as:  XANAX   omeprazole 20 MG capsule Commonly known as:  PRILOSEC   traZODone 50 MG tablet Commonly known as:  DESYREL     TAKE these medications   albuterol 108 (90 Base) MCG/ACT inhaler Commonly known as:  PROVENTIL HFA;VENTOLIN HFA Inhale 2 puffs into the lungs every 4 (four) hours as needed for wheezing or shortness of breath.   cholecalciferol 1000 units tablet Commonly known as:  VITAMIN D Take 1,000 Units by mouth daily.   ezetimibe 10 MG tablet Commonly known as:  ZETIA Take 10 mg by mouth daily.   furosemide 20 MG tablet Commonly known as:  LASIX Take 20 mg by mouth daily.   gabapentin 300 MG capsule Commonly known as:  NEURONTIN Take 300 mg by mouth 3 (three) times daily.   lactulose 10 GM/15ML solution Commonly known as:  CHRONULAC Take 60 mLs (40 g total) by mouth 3 (three) times daily. Titrate for 3-4 bowel motions daily.   Magnesium 250 MG Tabs Take 1 tablet by mouth daily.   metoprolol tartrate 50 MG tablet Commonly known as:  LOPRESSOR Take 50-100 mg by mouth 2 (two) times daily. 65m in the morning and 1075min the evening   multivitamin with minerals Tabs tablet Take 1 tablet by mouth daily.   potassium chloride SA 20 MEQ tablet Commonly known as:  K-DUR,KLOR-CON Take 20 mEq by mouth daily.   QUEtiapine 25 MG tablet Commonly known as:  SEROQUEL Take 1 tablet (25 mg total) by mouth at bedtime.   rifaximin 550 MG Tabs tablet Commonly  known as:  XIFAXAN Take 1 tablet (550 mg total) by mouth 2 (two) times daily.   tiotropium 18 MCG inhalation capsule Commonly known as:  SPIRIVA Place 18 mcg into inhaler and inhale daily.   vitamin B-12 1000 MCG tablet Commonly known as:  CYANOCOBALAMIN Take 1,000 mcg by mouth daily.       Allergies  Allergen Reactions  . Penicillins Rash    Has patient had a PCN reaction causing immediate rash, facial/tongue/throat swelling, SOB or lightheadedness with hypotension: Yes Has patient had a PCN reaction causing severe rash involving mucus membranes or skin necrosis: No Has patient had a PCN reaction that required hospitalization No Has patient had a PCN reaction occurring within the last 10 years: No If all of the above answers are "NO", then may proceed with Cephalosporin use.       Procedures/Studies: Ct Head Wo Contrast  Result Date: 09/11/2017 CLINICAL DATA:  Altered mental status, poor historian EXAM: CT HEAD WITHOUT CONTRAST TECHNIQUE: Contiguous axial images were obtained from the base of the skull through the vertex without intravenous contrast. COMPARISON:  MRI 12/27/2016, CT 12/22/2016, 12/21/2016, 12/13/2016  FINDINGS: Brain: No acute territorial infarction, hemorrhage or intracranial mass is visualized. Mild atrophy. Normal ventricle size. Vascular: No hyperdense vessels. Minimal calcification at the carotid siphons Skull: No fracture or suspicious abnormality. Sinuses/Orbits: Right this is bulbi. Minimal mucosal thickening in the ethmoid sinuses. Other: None IMPRESSION: No definite CT evidence for acute intracranial abnormality. Electronically Signed   By: Donavan Foil M.D.   On: 09/11/2017 22:52   Dg Chest Port 1 View  Result Date: 09/11/2017 CLINICAL DATA:  Fall. EXAM: PORTABLE CHEST 1 VIEW COMPARISON:  Dec 22, 2016 FINDINGS: Mild cardiomegaly. The hila and mediastinum are unchanged. No pulmonary nodules, masses, or focal infiltrates. No overt edema. IMPRESSION: No  active disease. Electronically Signed   By: Dorise Bullion III M.D   On: 09/11/2017 22:09     Subjective: Patient was seen and examined 09/14/2017, 11:26 AM Patient stable  Today. No acute distress.  No issues overnight Stable for discharge.  Discharge Exam:  Vitals:   09/13/17 2225 09/14/17 0407 09/14/17 0742 09/14/17 0831  BP: (!) 154/75 133/66  139/86  Pulse: 64 63  76  Resp: 20 18  16   Temp: 97.9 F (36.6 C) 98.4 F (36.9 C)    TempSrc: Oral Oral    SpO2: 97% 95% 96% 97%  Weight:      Height:        General: Pt lying comfortably in bed & appears in no obvious distress. Cardiovascular: S1 & S2 heard, RRR, S1/S2 +. No murmurs, rubs, gallops or clicks. No JVD or pedal edema. Respiratory: Clear to auscultation without wheezing, rhonchi or crackles. No increased work of breathing. Abdominal:  Non distended, non tender & soft. No organomegaly or masses appreciated. Normal bowel sounds heard. CNS: Alert and oriented. No focal deficits. Extremities: no edema, no cyanosis    The results of significant diagnostics from this hospitalization (including imaging, microbiology, ancillary and laboratory) are listed below for reference.     Microbiology: No results found for this or any previous visit (from the past 240 hour(s)).   Labs: CBC: Recent Labs  Lab 09/11/17 2158 09/12/17 0417  WBC 5.2 4.7  NEUTROABS 3.1 2.5  HGB 15.6 14.3  HCT 45.9 40.0  MCV 99.1 97.3  PLT 88* 78*   Basic Metabolic Panel: Recent Labs  Lab 09/11/17 2158 09/12/17 0417 09/13/17 0710  NA 140 141 139  K 3.5 3.0* 3.1*  CL 108 110 107  CO2 23 22 24   GLUCOSE 112* 112* 97  BUN 14 13 14   CREATININE 0.74 0.59* 0.62  CALCIUM 9.4 9.1 8.6*  MG  --  1.7  --   PHOS  --  2.8  --    Liver Function Tests: Recent Labs  Lab 09/11/17 2158 09/12/17 0417  AST 44* 39  ALT 17 17  ALKPHOS 142* 126  BILITOT 2.9* 2.9*  PROT 6.9 6.3*  ALBUMIN 3.3* 3.0*   BNP (last 3 results) No results for input(s):  BNP in the last 8760 hours. Cardiac Enzymes: No results for input(s): CKTOTAL, CKMB, CKMBINDEX, TROPONINI in the last 168 hours. CBG: Recent Labs  Lab 09/13/17 0745 09/13/17 1113 09/13/17 1613 09/13/17 2029 09/14/17 0726  GLUCAP 79 148* 106* 127* 92   Hgb A1c No results for input(s): HGBA1C in the last 72 hours. Lipid Profile No results for input(s): CHOL, HDL, LDLCALC, TRIG, CHOLHDL, LDLDIRECT in the last 72 hours. Thyroid function studies No results for input(s): TSH, T4TOTAL, T3FREE, THYROIDAB in the last 72 hours.  Invalid input(s): FREET3  Anemia work up No results for input(s): VITAMINB12, FOLATE, FERRITIN, TIBC, IRON, RETICCTPCT in the last 72 hours. Urinalysis    Component Value Date/Time   COLORURINE YELLOW 09/11/2017 2358   APPEARANCEUR CLEAR 09/11/2017 2358   LABSPEC 1.012 09/11/2017 2358   PHURINE 7.0 09/11/2017 2358   GLUCOSEU NEGATIVE 09/11/2017 2358   HGBUR LARGE (A) 09/11/2017 2358   BILIRUBINUR NEGATIVE 09/11/2017 [place] 09/11/2017 2358   PROTEINUR 100 (A) 09/11/2017 2358   UROBILINOGEN 1.0 01/03/2014 1748   NITRITE NEGATIVE 09/11/2017 2358   LEUKOCYTESUR NEGATIVE 09/11/2017 2358      Time coordinating discharge: Over 40 minutes  SIGNED: Deatra James, MD, FACP, FHM. [facility] Pager [phone]  If 7PM-7AM, please contact night-coverage [email or web address] Password [facility] 09/14/2017, 11:26 AM

## 2017-09-14 NOTE — Care Management Note (Signed)
Case Management Note  Patient Details  Name: [name] [name] MRN: [id number] Date of Birth: 02-05-1961  Subjective/Objective:           Admitted with hepatic encephalopathy. Pt lives at home with his wife. He has advanced cirrhosis. Pt's wife requesting hospice referral. She requests referral be sent to [facility]. No DME needed at this time.          Action/Plan: CM has faxed hospice referral to [facility]. Will f/u with [facility] to ensure they have received info. Pt's wife to pick pt up.   Expected Discharge Date:  09/14/17               Expected Discharge Plan:  [place] Referral:  Hospice / Palliative Care  Discharge planning Services  CM Consult  Post Acute Care Choice:  Hospice Choice offered to:  Spouse  HH Arranged:  RN [facility] Agency:  [facility]  Status of Service:  Completed, signed off  Sherald Barge, RN 09/14/2017, 11:30 AM

## 2017-09-14 NOTE — Progress Notes (Signed)
Patient discharged home IV removed and site intact. Discharged with all personal belongings.

## 2017-09-14 NOTE — Care Management (Signed)
Patient Information   SS# [id number]  Patient Name [name] [name], [name] [name] ([id number]) Sex Male DOB [birth year]  Room Bed  A334 A334-01  Patient Demographics   Address [place] [address] Phone [phone] (Home)  Patient Ethnicity & Race   Ethnic Group Patient Race  Not Hispanic or Latino White or Caucasian  Emergency Contact(s)   Name Relation Home Work Mobile  [place] Spouse [phone]    Delrae Rend Relative [phone]  [phone]  Documents on File    Status Date Received Description  Documents for the Patient  EMR Patient Summary Not Received    [place] Received 08/04/11   [place] E-Signature HIPAA Notice of Privacy Received 12/14/11   [place] E-Signature HIPAA Notice of Privacy Spanish Not Received    Driver's License Received 93/23/55   Advance Directives/Living Will/HCPOA/POA Not Received    Release of Information Not Received    [place] HIPAA NOTICE OF PRIVACY - Scanned Not Received  [facility] ENTERED INFO: [phone]  Insurance Card Received 02/13/75   Financial Application Not Received    Insurance Card Not Received    Insurance Card Not Received    [place] HIPAA NOTICE OF PRIVACY - Scanned Not Received    AMB Intake Forms/Questionnaires Not Received    AMB Correspondence Not Received  12/12 office note [facility]  AMB Correspondence Not Received  01/13 Note [facility]  AMB HH/NH/Hospice Not Received  01/13 Order ADV [facility]   Driver's License Not Received    AMB Correspondence Not Received  04/13 fam med [facility]  AMB Correspondence Not Received  08/13 fam med [place] [facility]  HIM ROI Authorization Not Received    HIM ROI Authorization Not Received    Release of Information Not Received    AMB HH/NH/Hospice Not Received  28/31 Cert/POC Adv [facility]  AMB Provider Completed Forms Not Received  10/13 Order [facility] Record Not Received  08/13 fam med [place]  08/14/13 pa-tamara perry  HIM ROI Authorization  03/16/14   HIM ROI Authorization  03/16/14   HIM ROI Authorization  03/23/14   HIM ROI Authorization  05/14/14   Other Photo ID Not Received    AMB Correspondence  07/16/14 ADMISSION RECORD [place]  AMB Outside Consult Note  12/04/14   [place] E-Signature HIPAA Notice of Privacy Signed 10/29/16   Insurance Card (Deleted) 08/25/11   AMB Correspondence (Deleted) 09/03/11 01/13 Note [place]  AMB HH/NH/Hospice (Deleted) 51/76/16 07/37 Cert/POC/POT Adv [facility]  AMB Correspondence (Deleted) 04/14/12   AMB Provider Completed Forms (Deleted) 12/04/14 REPORT OF CONSULTATION  Documents for the Encounter  AOB (Assignment of Insurance Benefits) Not Received    E-signature AOB Unable to Obtain 09/11/17   MEDICARE RIGHTS Not Received    E-signature Medicare Rights Unable to Obtain 09/11/17   ED Patient Billing Extract   ED PB Billing Extract  Photos     Photos     Cardiac Monitoring Strip Received 09/12/17   Cardiac Monitoring Strip Shift Summary Received 09/12/17   Admission Information   Attending Provider Admitting Provider Admission Type Admission Date/Time  Deatra James, MD Reubin Milan, MD Emergency 09/11/17 2057  Discharge Date Hospital Service Auth/Cert Status Service Area   Internal Medicine Incomplete [place]  Unit Room/Bed Admission Status   AP-DEPT 300 A334/A334-01 Admission (Confirmed)   Admission  Complaint  fall  Hospital Account   Name Acct ID Class Status Primary Coverage  [name] [name], [name] [name] [id number] Inpatient Open [place] MEDICARE HMO/PPO      Guarantor Account (for Hospital Account [id number])   Name Relation to Pt Service Area Active? Acct Type  [name] [name], [name] [name] Yes Personal/Family  Address Phone    [address], [place] [address] [phone])        Coverage Information (for  Hospital Account [id number])   F/O Payor/Plan Precert #  [employer] [place] MEDICARE HMO/PPO   Subscriber Subscriber #  [name] [name], [name] [name] [facility]  [place]  [address] [place] [address] [phone]

## 2017-09-14 NOTE — ACP (Advance Care Planning) (Signed)
Patient and wife endorse DNR. Forms completed.

## 2017-09-14 NOTE — Care Management Important Message (Signed)
Important Message  Patient Details  Name: [name] [name] MRN: [id number] Date of Birth: [birth year]   Medicare Important Message Given:  Yes    Sherald Barge, RN 09/14/2017, 11:32 AM

## 2017-10-03 DIAGNOSIS — M86671 Other chronic osteomyelitis, right ankle and foot: Secondary | ICD-10-CM | POA: Diagnosis not present

## 2017-10-03 DIAGNOSIS — R262 Difficulty in walking, not elsewhere classified: Secondary | ICD-10-CM | POA: Diagnosis not present

## 2017-10-03 DIAGNOSIS — E1149 Type 2 diabetes mellitus with other diabetic neurological complication: Secondary | ICD-10-CM | POA: Diagnosis not present

## 2017-10-03 DIAGNOSIS — E11621 Type 2 diabetes mellitus with foot ulcer: Secondary | ICD-10-CM | POA: Diagnosis not present

## 2017-10-03 DIAGNOSIS — M6281 Muscle weakness (generalized): Secondary | ICD-10-CM | POA: Diagnosis not present

## 2017-10-03 DIAGNOSIS — I6350 Cerebral infarction due to unspecified occlusion or stenosis of unspecified cerebral artery: Secondary | ICD-10-CM | POA: Diagnosis not present

## 2017-10-03 DIAGNOSIS — K7460 Unspecified cirrhosis of liver: Secondary | ICD-10-CM | POA: Diagnosis not present

## 2017-10-03 DIAGNOSIS — E1165 Type 2 diabetes mellitus with hyperglycemia: Secondary | ICD-10-CM | POA: Diagnosis not present

## 2017-10-03 DIAGNOSIS — J449 Chronic obstructive pulmonary disease, unspecified: Secondary | ICD-10-CM | POA: Diagnosis not present

## 2017-10-03 DIAGNOSIS — S82309A Unspecified fracture of lower end of unspecified tibia, initial encounter for closed fracture: Secondary | ICD-10-CM | POA: Diagnosis not present

## 2017-10-04 DIAGNOSIS — K7290 Hepatic failure, unspecified without coma: Secondary | ICD-10-CM | POA: Diagnosis not present

## 2017-10-04 DIAGNOSIS — Z09 Encounter for follow-up examination after completed treatment for conditions other than malignant neoplasm: Secondary | ICD-10-CM | POA: Diagnosis not present

## 2017-10-04 DIAGNOSIS — K7460 Unspecified cirrhosis of liver: Secondary | ICD-10-CM | POA: Diagnosis not present

## 2017-10-04 DIAGNOSIS — F319 Bipolar disorder, unspecified: Secondary | ICD-10-CM | POA: Diagnosis not present

## 2017-10-04 DIAGNOSIS — E1142 Type 2 diabetes mellitus with diabetic polyneuropathy: Secondary | ICD-10-CM | POA: Diagnosis not present

## 2017-10-04 DIAGNOSIS — G894 Chronic pain syndrome: Secondary | ICD-10-CM | POA: Diagnosis not present

## 2017-10-04 DIAGNOSIS — I10 Essential (primary) hypertension: Secondary | ICD-10-CM | POA: Diagnosis not present

## 2017-10-04 DIAGNOSIS — R3129 Other microscopic hematuria: Secondary | ICD-10-CM | POA: Diagnosis not present

## 2017-10-04 DIAGNOSIS — R69 Illness, unspecified: Secondary | ICD-10-CM | POA: Diagnosis not present

## 2017-10-10 ENCOUNTER — Ambulatory Visit (INDEPENDENT_AMBULATORY_CARE_PROVIDER_SITE_OTHER): Payer: Medicare HMO | Admitting: Gastroenterology

## 2017-10-10 ENCOUNTER — Encounter: Payer: Self-pay | Admitting: Gastroenterology

## 2017-10-10 ENCOUNTER — Telehealth: Payer: Self-pay

## 2017-10-10 ENCOUNTER — Other Ambulatory Visit: Payer: Self-pay

## 2017-10-10 VITALS — BP 157/79 | HR 74 | Temp 96.9°F | Ht 67.0 in | Wt 233.2 lb

## 2017-10-10 DIAGNOSIS — R1310 Dysphagia, unspecified: Secondary | ICD-10-CM | POA: Insufficient documentation

## 2017-10-10 DIAGNOSIS — Z1211 Encounter for screening for malignant neoplasm of colon: Secondary | ICD-10-CM

## 2017-10-10 DIAGNOSIS — K7460 Unspecified cirrhosis of liver: Secondary | ICD-10-CM

## 2017-10-10 MED ORDER — PEG 3350-KCL-NA BICARB-NACL 420 G PO SOLR: 4000.0000 mL | ORAL | 0 refills | Status: DC

## 2017-10-10 MED ORDER — PANTOPRAZOLE SODIUM 40 MG PO TBEC: 40.0000 mg | DELAYED_RELEASE_TABLET | Freq: Every day | ORAL | 3 refills | Status: DC

## 2017-10-10 NOTE — Telephone Encounter (Signed)
Called and spoke to pt's wife. TCS/EGD/DIL w/Propofol with RMR scheduled for 11/07/17 at 12:30pm. Rx for prep sent to pharmacy. Orders entered. AB advised no Glimeperide morning of procedure. Will mail instructions after pre-op appt is scheduled.

## 2017-10-10 NOTE — Telephone Encounter (Signed)
Called and informed pt's wife of pre-op appt 10/31/17 at 12:45pm. Also informed of [place] abd RUQ appt 10/13/17 at 9:30am, pt to arrive at 9:15am. NPO after midnight before test. Letters mailed with procedure instructions.

## 2017-10-10 NOTE — Progress Notes (Signed)
Primary Care Physician:  Hermine Messick, MD Primary Gastroenterologist:  Dr. Gala Romney   Chief Complaint  Patient presents with  . elevated LFT's    HPI:   [name] [name] is a 57 y.o. male presenting today at the request of Dr. Hermine Messick secondary to cirrhosis.   Last seen in Sept 2013 by [facility]. 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 He was AMA Positive in the past. Hep C negative in 2011. Hep B surface antigen negative. Unclear if he is immune to Hep A and B, as there is mention of starting vaccinations in 2014. History of encephalopathy. Last EGD in 2013 with portal gastropathy. Poor prep on colonoscopy in April 2013. Recently inpatient Jan 2019 with encephalopathy. Markedly overdue for hepatoma screening.   Not a candidate for hospice. Has 10-15 BMs a day. Taking 60 mgl TID. Xifaxan BID. He states his brother has "too much iron" in his liver. Prilosec 20 mg daily. Reflux is flaring up.  Slight pain in stomach occasionally. No distension. Loves to eat. Notes occasional solid food dysphagia. Taking Ibuprofen routinely.   Oxycodone prescribed but hasn't starting taking it yet.   Past Medical History:  Diagnosis Date  . Anemia   . Anxiety   . Arthritis   . Bipolar 1 disorder ([place])   . Blind right eye   . Blood clots in brain    per patient  . Cardiomegaly   . Cataract    Right eye  . Cerebrovascular disease    Right vertebral artery  . Cholelithiasis    Asymptomatic  . Chronic back pain   . Cirrhosis ([place])    suspected NASH  . Cirrhosis ([place])    diagnosed 07/2010 when presented with first episode of hepatic encephalopathy, afp on 416/13=4.8,  U/S on 12/14/11  liver stable, pt has received 2 hep A/B vaccines  . COPD (chronic obstructive pulmonary disease) ([place])   . Depression   . Diabetes mellitus   . Diverticulosis   . GERD (gastroesophageal reflux disease)   . Hard of hearing   . Hepatic encephalopathy ([place])   . Hyperlipidemia   . Hypertension   . Mental retardation     . Seizures ([place])   . Thrombocytopenia ([place])     Past Surgical History:  Procedure Laterality Date  . CAST APPLICATION  05/01/2425   Procedure: MINOR CAST APPLICATION;  Surgeon: [name], MD;  Location: [facility];  Service: Orthopedics;  Laterality: Right;  no anesthesia , procedure room do not need or room !  . COLONOSCOPY  12/15/11   colonic divericulosis;poor prep, ACBE  recommended but has not been done  . ear operations    . ESOPHAGOGASTRODUODENOSCOPY  02/02/2011   [place]-Normal esophagus.  No varices/Question mild portal gastropathy, extrinsic compression on the antrum, lesser curvature of uncertain significance, some minimally  nodular mucosa status post biopsy, patent pylorus, normal duodenum 1 and duodenum 2.  . ESOPHAGOGASTRODUODENOSCOPY  12/15/11   [place]-->mild changes of portal gastropathy, gastric/duodenal erosions s/p bx  (reactive changes with mild chronic inflammation. No H.pylori  . FOOT SURGERY    . ORIF ANKLE FRACTURE  08/27/2011   Procedure: OPEN REDUCTION INTERNAL FIXATION (ORIF) ANKLE FRACTURE;  Surgeon: [name], MD;  Location: [facility];  Service: Orthopedics;  Laterality: Right;  . TONSILLECTOMY      Current Outpatient Medications  Medication Sig Dispense Refill  . albuterol (PROVENTIL HFA;VENTOLIN HFA) 108 (90 Base) MCG/ACT inhaler Inhale 2 puffs into the lungs every 4 (four) hours as needed  for wheezing or shortness of breath.    Marland Kitchen albuterol (PROVENTIL) (2.5 MG/3ML) 0.083% nebulizer solution Take 2.5 mg by nebulization every 6 (six) hours as needed for wheezing or shortness of breath.    Marland Kitchen aspirin EC 81 MG tablet Take 81 mg by mouth daily.    . cholecalciferol (VITAMIN D) 1000 units tablet Take 2,000 Units by mouth daily.     Marland Kitchen ezetimibe (ZETIA) 10 MG tablet Take 10 mg by mouth daily.    . furosemide (LASIX) 20 MG tablet Take 20 mg by mouth daily.    Marland Kitchen gabapentin (NEURONTIN) 300 MG capsule Take 300 mg by mouth 3 (three) times daily.    Marland Kitchen glimepiride  (AMARYL) 2 MG tablet Take 2 mg by mouth daily with breakfast.    . ibuprofen (ADVIL,MOTRIN) 600 MG tablet Take 600 mg by mouth every 6 (six) hours as needed.    . lactulose (CHRONULAC) 10 GM/15ML solution Take 60 mLs (40 g total) by mouth 3 (three) times daily. Titrate for 3-4 bowel motions daily. 240 mL 2  . Magnesium 250 MG TABS Take 1 tablet by mouth daily.    . metoprolol tartrate (LOPRESSOR) 50 MG tablet Take 50 mg by mouth 3 (three) times daily.     . Multiple Vitamin (MULTIVITAMIN WITH MINERALS) TABS Take 1 tablet by mouth daily.    . Omega-3 Fatty Acids (FISH OIL) 1000 MG CAPS Take 1 capsule by mouth daily.    Marland Kitchen omeprazole (PRILOSEC) 20 MG capsule Take 20 mg by mouth daily.    . potassium chloride SA (K-DUR,KLOR-CON) 20 MEQ tablet Take 20 mEq by mouth daily.     . QUEtiapine (SEROQUEL) 25 MG tablet Take 1 tablet (25 mg total) by mouth at bedtime.    . rifaximin (XIFAXAN) 550 MG TABS tablet Take 1 tablet (550 mg total) by mouth 2 (two) times daily.    Marland Kitchen tiotropium (SPIRIVA) 18 MCG inhalation capsule Place 18 mcg into inhaler and inhale daily.    . vitamin B-12 (CYANOCOBALAMIN) 1000 MCG tablet Take 1,000 mcg by mouth daily.     No current facility-administered medications for this visit.     Allergies as of 10/10/2017 - Review Complete 10/10/2017  Allergen Reaction Noted  . Penicillins Rash 02/02/2011    Family History  Problem Relation Age of Onset  . Heart failure Mother   . Thyroid disease Mother   . Cancer Father   . Asthma Brother   . Heart attack Brother   . Cirrhosis Brother        EtOH  . Colon cancer Maternal Aunt     Social History   Socioeconomic History  . Marital status: Married    Spouse name: Not on file  . Number of children: 0  . Years of education: Not on file  . Highest education level: Not on file  Social Needs  . Financial resource strain: Not on file  . Food insecurity - worry: Not on file  . Food insecurity - inability: Not on file  .  Transportation needs - medical: Not on file  . Transportation needs - non-medical: Not on file  Occupational History  . Occupation: disabled    Fish farm manager: UNEMPLOYED  Tobacco Use  . Smoking status: Current Every Day Smoker    Packs/day: 1.00    Years: 30.00    Pack years: 30.00    Types: Cigarettes  . Smokeless tobacco: Never Used  . Tobacco comment: only smokes about 1-2 a day  Substance  and Sexual Activity  . Alcohol use: No  . Drug use: No  . Sexual activity: Not on file  Other Topics Concern  . Not on file  Social History Narrative  . Not on file    Review of Systems: Gen: Denies any fever, chills, fatigue, weight loss, lack of appetite.  CV: Denies chest pain, heart palpitations, peripheral edema, syncope.  Resp: Denies shortness of breath at rest or with exertion. Denies wheezing or cough.  GI: see HPI  GU : Denies urinary burning, urinary frequency, urinary hesitancy MS: Denies joint pain, muscle weakness, cramps, or limitation of movement.  Derm: Denies rash, itching, dry skin Psych: Denies depression, anxiety, memory loss, and confusion Heme: Denies bruising, bleeding, and enlarged lymph nodes.  Physical Exam: BP (!) 157/79   Pulse 74   Temp (!) 96.9 F (36.1 C) (Oral)   Ht 5' 7"  (1.702 m)   Wt 233 lb 3.2 oz (105.8 kg)   BMI 36.52 kg/m  General:   Alert and oriented. Pleasant and cooperative. Well-nourished and well-developed.  Head:  Normocephalic and atraumatic. Eyes:  Without icterus, sclera clear and conjunctiva pink.  Ears:  Hard of hearing  Nose:  No deformity, discharge,  or lesions. Mouth:  No deformity or lesions, oral mucosa pink.  Lungs:  Clear to auscultation bilaterally.  Heart:  S1, S2 present without murmurs appreciated.  Abdomen:  +BS, soft, non-tender and non-distended. No HSM noted. No guarding or rebound. No masses appreciated.  Rectal:  Deferred  Msk:  Symmetrical without gross deformities. Normal posture. Extremities:  Without   edema. Neurologic:  Alert and  oriented x4;  grossly normal neurologically. Psych:  Alert and cooperative. Normal mood and affect.  Lab Results  Component Value Date   WBC 4.7 09/12/2017   HGB 14.3 09/12/2017   HCT 40.0 09/12/2017   MCV 97.3 09/12/2017   PLT 78 (L) 09/12/2017   Lab Results  Component Value Date   ALT 17 09/12/2017   AST 39 09/12/2017   ALKPHOS 126 09/12/2017   BILITOT 2.9 (H) 09/12/2017

## 2017-10-10 NOTE — Patient Instructions (Addendum)
For your liver: 1. I have ordered an ultrasound and blood work. 2. Decrease lactulose to 30 milliliters three times a day. Call me if you still have diarrhea. The goal is 3 soft bowel movements a day. 3. Keep taking Xifaxan twice a day. 4. We have scheduled you for an upper endoscopy to look at your esophagus and stomach and make sure there are no swollen vessels in your esophagus that can happen with chronic liver disease. 5. STOP IBUPROFEN. This is not helpful for your liver. You can have tylenol occasionally if needed, but do not take more than 2 grams a day.    We have also scheduled a colonoscopy in the near future at time of the upper endoscopy.  I will see you back in about 8 weeks!  It was a pleasure to see you today. I strive to create trusting relationships with patients to provide genuine, compassionate, and quality care. I value your feedback. If you receive a survey regarding your visit,  I greatly appreciate you the taking time to fill this out.   Annitta Needs, PhD, ANP-BC [facility]

## 2017-10-11 ENCOUNTER — Ambulatory Visit: Payer: Self-pay | Admitting: Gastroenterology

## 2017-10-13 ENCOUNTER — Ambulatory Visit (HOSPITAL_COMMUNITY): Payer: Medicare HMO | Attending: Gastroenterology

## 2017-10-16 ENCOUNTER — Telehealth: Payer: Self-pay | Admitting: Gastroenterology

## 2017-10-16 NOTE — Assessment & Plan Note (Signed)
Dilation as appropriate at time of EGD. Stop Prilosec. Trial of Protonix.

## 2017-10-16 NOTE — Telephone Encounter (Signed)
[name] [name]:  Patient has not completed labs yet. I ordered additional labs that he needs to have done, so they should be printed already.  [facility] clinical pool: let's add an extra half day of clear liquids prior to colonoscopy due to poor prep in the past.

## 2017-10-16 NOTE — Assessment & Plan Note (Signed)
Poor prep in 2013. Will add extra day of clear liquids in preparation.   Proceed with TCS with Dr. Gala Romney in near future: the risks, benefits, and alternatives have been discussed with the patient in detail. The patient states understanding and desires to proceed. Propofol due to polypharmacy

## 2017-10-16 NOTE — Assessment & Plan Note (Addendum)
57 year old male with suspected NASH cirrhosis, history of encephalopathy, and lost to follow-up. AMA positive in past. Overdue for hepatoma screening. Today, he appears fairly well-compensated. Due to cognitive deficits, compliance is a concern; however, his wife is present with him and a good historian. Discussed at length titration of lactulose. Due for EGD for variceal screening, as last was in 2013 with portal gastropathy. May pursue dilation at time of EGD due to solid food dysphagia.   Proceed with upper endoscopy/dilation  in the near future with Dr. Gala Romney. The risks, benefits, and alternatives have been discussed in detail with patient. They have stated understanding and desire to proceed.  PROPOFOL due to polypharmacy Diarrhea on lactulose: titrate down with goal of 3 soft bowel movements a day Continue Xifaxan BID Labs to include CBC, CMP, INR, AMA, ASMA, ANA, immunoglobulins, iron studies (reports history of brother with iron overload), and assess immunity to Hep A and B as it is unclear the status US abdomen for hepatoma screening Stop ibruprofen Return for close follow-up in 8 weeks

## 2017-10-17 ENCOUNTER — Telehealth: Payer: Self-pay | Admitting: Internal Medicine

## 2017-10-17 ENCOUNTER — Encounter: Payer: Self-pay | Admitting: *Deleted

## 2017-10-17 NOTE — Telephone Encounter (Signed)
pts spouse notified that pt needs labs done.

## 2017-10-17 NOTE — Telephone Encounter (Signed)
Spoke with pts spouse. She needed an address for [facility].

## 2017-10-17 NOTE — Telephone Encounter (Signed)
Please call patient at [phone].

## 2017-10-17 NOTE — Progress Notes (Signed)
cc'd to pcp 

## 2017-10-17 NOTE — Telephone Encounter (Signed)
Called spoke with spouse and is aware need to add extra half day of clear liquids. New instructions mailed.

## 2017-10-18 DIAGNOSIS — K7460 Unspecified cirrhosis of liver: Secondary | ICD-10-CM | POA: Diagnosis not present

## 2017-10-21 LAB — COMPLETE METABOLIC PANEL WITH GFR
AG Ratio: 1.1 (calc) (ref 1.0–2.5)
ALBUMIN MSPROF: 3 g/dL — AB (ref 3.6–5.1)
ALKALINE PHOSPHATASE (APISO): 119 U/L — AB (ref 40–115)
ALT: 24 U/L (ref 9–46)
AST: 55 U/L — ABNORMAL HIGH (ref 10–35)
BUN: 10 mg/dL (ref 7–25)
CALCIUM: 8.7 mg/dL (ref 8.6–10.3)
CO2: 29 mmol/L (ref 20–32)
CREATININE: 0.78 mg/dL (ref 0.70–1.33)
Chloride: 108 mmol/L (ref 98–110)
GFR, EST NON AFRICAN AMERICAN: 99 mL/min/{1.73_m2} (ref >=60)
GFR, Est African American: 115 mL/min/{1.73_m2} (ref >=60)
GLUCOSE: 90 mg/dL (ref 65–139)
Globulin: 2.7 g/dL (calc) (ref 1.9–3.7)
Potassium: 4.2 mmol/L (ref 3.5–5.3)
Sodium: 140 mmol/L (ref 135–146)
Total Bilirubin: 1.8 mg/dL — ABNORMAL HIGH (ref 0.2–1.2)
Total Protein: 5.7 g/dL — ABNORMAL LOW (ref 6.1–8.1)

## 2017-10-21 LAB — PROTIME-INR
INR: 1.2 — ABNORMAL HIGH
PROTHROMBIN TIME: 12.7 s — AB (ref 9.0–11.5)

## 2017-10-21 LAB — HEPATITIS B SURFACE ANTIBODY,QUALITATIVE: HEP B S AB: NONREACTIVE

## 2017-10-21 LAB — IRON,TIBC AND FERRITIN PANEL
%SAT: 42 % (calc) (ref 15–60)
Ferritin: 111 ng/mL (ref 20–380)
IRON: 107 ug/dL (ref 50–180)
TIBC: 254 mcg/dL (calc) (ref 250–425)

## 2017-10-21 LAB — MITOCHONDRIAL ANTIBODIES

## 2017-10-21 LAB — HEPATITIS A ANTIBODY, TOTAL: Hepatitis A AB,Total: NONREACTIVE

## 2017-10-21 LAB — IGG, IGA, IGM
IGM, SERUM: 211 mg/dL (ref 48–271)
IgG (Immunoglobin G), Serum: 1282 mg/dL (ref 694–1618)
Immunoglobulin A: 341 mg/dL (ref 81–463)

## 2017-10-21 LAB — ANA: Anti Nuclear Antibody(ANA): NEGATIVE

## 2017-10-21 LAB — ANTI-SMOOTH MUSCLE ANTIBODY, IGG: Actin (Smooth Muscle) Antibody (IGG): <20 U (ref <20)

## 2017-10-27 NOTE — Patient Instructions (Signed)
[name] [name]  10/27/2017     @PREFPERIOPPHARMACY @   Your procedure is scheduled on  11/07/2017   Report to [facility] at  60  A.M.  Call this number if you have problems the morning of surgery:  [phone]   Remember:  Do not eat food or drink liquids after midnight.  Take these medicines the morning of surgery with A SIP OF WATER  Neurontin, metoprolol, protonix, oxycodone, rifaximin. Use your nebulizer and your inhaler before you come. DO NOT take any medications for diabetes the morning of your procedure.   Do not wear jewelry, make-up or nail polish.  Do not wear lotions, powders, or perfumes, or deodorant.  Do not shave 48 hours prior to surgery.  Men may shave face and neck.  Do not bring valuables to the hospital.  [facility] is not responsible for any belongings or valuables.  Contacts, dentures or bridgework may not be worn into surgery.  Leave your suitcase in the car.  After surgery it may be brought to your room.  For patients admitted to the hospital, discharge time will be determined by your treatment team.  Patients discharged the day of surgery will not be allowed to drive home.   Name and phone number of your driver:   family Special instructions:  Follow the diet and prep instructions given to you by Dr Roseanne Kaufman office.  Please read over the following fact sheets that you were given. Anesthesia Post-op Instructions and Care and Recovery After Surgery       Esophagogastroduodenoscopy Esophagogastroduodenoscopy (EGD) is a procedure to examine the lining of the esophagus, stomach, and first part of the small intestine (duodenum). This procedure is done to check for problems such as inflammation, bleeding, ulcers, or growths. During this procedure, a long, flexible, lighted tube with a camera attached (endoscope) is inserted down the throat. Tell a health care provider about:  Any allergies you have.  All medicines you are  taking, including vitamins, herbs, eye drops, creams, and over-the-counter medicines.  Any problems you or family members have had with anesthetic medicines.  Any blood disorders you have.  Any surgeries you have had.  Any medical conditions you have.  Whether you are pregnant or may be pregnant. What are the risks? Generally, this is a safe procedure. However, problems may occur, including:  Infection.  Bleeding.  A tear (perforation) in the esophagus, stomach, or duodenum.  Trouble breathing.  Excessive sweating.  Spasms of the larynx.  A slowed heartbeat.  Low blood pressure.  What happens before the procedure?  Follow instructions from your health care provider about eating or drinking restrictions.  Ask your health care provider about: ? Changing or stopping your regular medicines. This is especially important if you are taking diabetes medicines or blood thinners. ? Taking medicines such as aspirin and ibuprofen. These medicines can thin your blood. Do not take these medicines before your procedure if your health care provider instructs you not to.  Plan to have someone take you home after the procedure.  If you wear dentures, be ready to remove them before the procedure. What happens during the procedure?  To reduce your risk of infection, your health care team will wash or sanitize their hands.  An IV tube will be put in a vein in your hand or arm. You will get medicines and fluids through this tube.  You will  be given one or more of the following: ? A medicine to help you relax (sedative). ? A medicine to numb the area (local anesthetic). This medicine may be sprayed into your throat. It will make you feel more comfortable and keep you from gagging or coughing during the procedure. ? A medicine for pain.  A mouth guard may be placed in your mouth to protect your teeth and to keep you from biting on the endoscope.  You will be asked to lie on your left  side.  The endoscope will be lowered down your throat into your esophagus, stomach, and duodenum.  Air will be put into the endoscope. This will help your health care provider see better.  The lining of your esophagus, stomach, and duodenum will be examined.  Your health care provider may: ? Take a tissue sample so it can be looked at in a lab (biopsy). ? Remove growths. ? Remove objects (foreign bodies) that are stuck. ? Treat any bleeding with medicines or other devices that stop tissue from bleeding. ? Widen (dilate) or stretch narrowed areas of your esophagus and stomach.  The endoscope will be taken out. The procedure may vary among health care providers and hospitals. What happens after the procedure?  Your blood pressure, heart rate, breathing rate, and blood oxygen level will be monitored often until the medicines you were given have worn off.  Do not eat or drink anything until the numbing medicine has worn off and your gag reflex has returned. This information is not intended to replace advice given to you by your health care provider. Make sure you discuss any questions you have with your health care provider. Document Released: 12/10/2004 Document Revised: 01/15/2016 Document Reviewed: 07/03/2015 Elsevier Interactive Patient Education  2018 [employer]. Esophagogastroduodenoscopy, Care After Refer to this sheet in the next few weeks. These instructions provide you with information about caring for yourself after your procedure. Your health care provider may also give you more specific instructions. Your treatment has been planned according to current medical practices, but problems sometimes occur. Call your health care provider if you have any problems or questions after your procedure. What can I expect after the procedure? After the procedure, it is common to have:  A sore throat.  Nausea.  Bloating.  Dizziness.  Fatigue.  Follow these instructions at  home:  Do not eat or drink anything until the numbing medicine (local anesthetic) has worn off and your gag reflex has returned. You will know that the local anesthetic has worn off when you can swallow comfortably.  Do not drive for 24 hours if you received a medicine to help you relax (sedative).  If your health care provider took a tissue sample for testing during the procedure, make sure to get your test results. This is your responsibility. Ask your health care provider or the department performing the test when your results will be ready.  Keep all follow-up visits as told by your health care provider. This is important. Contact a health care provider if:  You cannot stop coughing.  You are not urinating.  You are urinating less than usual. Get help right away if:  You have trouble swallowing.  You cannot eat or drink.  You have throat or chest pain that gets worse.  You are dizzy or light-headed.  You faint.  You have nausea or vomiting.  You have chills.  You have a fever.  You have severe abdominal pain.  You have black, tarry,  or bloody stools. This information is not intended to replace advice given to you by your health care provider. Make sure you discuss any questions you have with your health care provider. Document Released: 07/26/2012 Document Revised: 01/15/2016 Document Reviewed: 07/03/2015 Elsevier Interactive Patient Education  2018 [employer].  Esophageal Dilatation Esophageal dilatation is a procedure to open a blocked or narrowed part of the esophagus. The esophagus is the long tube in your throat that carries food and liquid from your mouth to your stomach. The procedure is also called esophageal dilation. You may need this procedure if you have a buildup of scar tissue in your esophagus that makes it difficult, painful, or even impossible to swallow. This can be caused by gastroesophageal reflux disease (GERD). In rare cases, people need this  procedure because they have cancer of the esophagus or a problem with the way food moves through the esophagus. Sometimes you may need to have another dilatation to enlarge the opening of the esophagus gradually. Tell a health care provider about:  Any allergies you have.  All medicines you are taking, including vitamins, herbs, eye drops, creams, and over-the-counter medicines.  Any problems you or family members have had with anesthetic medicines.  Any blood disorders you have.  Any surgeries you have had.  Any medical conditions you have.  Any antibiotic medicines you are required to take before dental procedures. What are the risks? Generally, this is a safe procedure. However, problems can occur and include:  Bleeding from a tear in the lining of the esophagus.  A hole (perforation) in the esophagus.  What happens before the procedure?  Do not eat or drink anything after midnight on the night before the procedure or as directed by your health care provider.  Ask your health care provider about changing or stopping your regular medicines. This is especially important if you are taking diabetes medicines or blood thinners.  Plan to have someone take you home after the procedure. What happens during the procedure?  You will be given a medicine that makes you relaxed and sleepy (sedative).  A medicine may be sprayed or gargled to numb the back of the throat.  Your health care provider can use various instruments to do an esophageal dilatation. During the procedure, the instrument used will be placed in your mouth and passed down into your esophagus. Options include: ? Simple dilators. This instrument is carefully placed in the esophagus to stretch it. ? Guided wire bougies. In this method, a flexible tube (endoscope) is used to insert a wire into the esophagus. The dilator is passed over this wire to enlarge the esophagus. Then the wire is removed. ? Balloon dilators. An  endoscope with a small balloon at the end is passed down into the esophagus. Inflating the balloon gently stretches the esophagus and opens it up. What happens after the procedure?  Your blood pressure, heart rate, breathing rate, and blood oxygen level will be monitored often until the medicines you were given have worn off.  Your throat may feel slightly sore and will probably still feel numb. This will improve slowly over time.  You will not be allowed to eat or drink until the throat numbness has resolved.  If this is a same-day procedure, you may be allowed to go home once you have been able to drink, urinate, and sit on the edge of the bed without nausea or dizziness.  If this is a same-day procedure, you should have a friend or family  member with you for the next 24 hours after the procedure. This information is not intended to replace advice given to you by your health care provider. Make sure you discuss any questions you have with your health care provider. Document Released: 09/30/2005 Document Revised: 01/15/2016 Document Reviewed: 12/19/2013 Elsevier Interactive Patient Education  [employer].  Colonoscopy, Adult A colonoscopy is an exam to look at the large intestine. It is done to check for problems, such as:  Lumps (tumors).  Growths (polyps).  Swelling (inflammation).  Bleeding.  What happens before the procedure? Eating and drinking Follow instructions from your doctor about eating and drinking. These instructions may include:  A few days before the procedure - follow a low-fiber diet. ? Avoid nuts. ? Avoid seeds. ? Avoid dried fruit. ? Avoid raw fruits. ? Avoid vegetables.  1-3 days before the procedure - follow a clear liquid diet. Avoid liquids that have red or purple dye. Drink only clear liquids, such as: ? Clear broth or bouillon. ? Black coffee or tea. ? Clear juice. ? Clear soft drinks or sports drinks. ? Gelatin dessert. ? Popsicles.  On  the day of the procedure - do not eat or drink anything during the 2 hours before the procedure.  Bowel prep If you were prescribed an oral bowel prep:  Take it as told by your doctor. Starting the day before your procedure, you will need to drink a lot of liquid. The liquid will cause you to poop (have bowel movements) until your poop is almost clear or light green.  If your skin or butt gets irritated from diarrhea, you may: ? Wipe the area with wipes that have medicine in them, such as adult wet wipes with aloe and vitamin E. ? Put something on your skin that soothes the area, such as petroleum jelly.  If you throw up (vomit) while drinking the bowel prep, take a break for up to 60 minutes. Then begin the bowel prep again. If you keep throwing up and you cannot take the bowel prep without throwing up, call your doctor.  General instructions  Ask your doctor about changing or stopping your normal medicines. This is important if you take diabetes medicines or blood thinners.  Plan to have someone take you home from the hospital or clinic. What happens during the procedure?  An IV tube may be put into one of your veins.  You will be given medicine to help you relax (sedative).  To reduce your risk of infection: ? Your doctors will wash their hands. ? Your anal area will be washed with soap.  You will be asked to lie on your side with your knees bent.  Your doctor will get a long, thin, flexible tube ready. The tube will have a camera and a light on the end.  The tube will be put into your anus.  The tube will be gently put into your large intestine.  Air will be delivered into your large intestine to keep it open. You may feel some pressure or cramping.  The camera will be used to take photos.  A small tissue sample may be removed from your body to be looked at under a microscope (biopsy). If any possible problems are found, the tissue will be sent to a lab for testing.  If  small growths are found, your doctor may remove them and have them checked for cancer.  The tube that was put into your anus will be slowly removed. The  procedure may vary among doctors and hospitals. What happens after the procedure?  Your doctor will check on you often until the medicines you were given have worn off.  Do not drive for 24 hours after the procedure.  You may have a small amount of blood in your poop.  You may pass gas.  You may have mild cramps or bloating in your belly (abdomen).  It is up to you to get the results of your procedure. Ask your doctor, or the department performing the procedure, when your results will be ready. This information is not intended to replace advice given to you by your health care provider. Make sure you discuss any questions you have with your health care provider. Document Released: 09/11/2010 Document Revised: 06/09/2016 Document Reviewed: 10/21/2015 Elsevier Interactive Patient Education  2017 [employer]  Colonoscopy, Adult, Care After This sheet gives you information about how to care for yourself after your procedure. Your health care provider may also give you more specific instructions. If you have problems or questions, contact your health care provider. What can I expect after the procedure? After the procedure, it is common to have:  A small amount of blood in your stool for 24 hours after the procedure.  Some gas.  Mild abdominal cramping or bloating.  Follow these instructions at home: General instructions   For the first 24 hours after the procedure: ? Do not drive or use machinery. ? Do not sign important documents. ? Do not drink alcohol. ? Do your regular daily activities at a slower pace than normal. ? Eat soft, easy-to-digest foods. ? Rest often.  Take over-the-counter or prescription medicines only as told by your health care provider.  It is up to you to get the results of your procedure. Ask your  health care provider, or the department performing the procedure, when your results will be ready. Relieving cramping and bloating  Try walking around when you have cramps or feel bloated.  Apply heat to your abdomen as told by your health care provider. Use a heat source that your health care provider recommends, such as a moist heat pack or a heating pad. ? Place a towel between your skin and the heat source. ? Leave the heat on for 20-30 minutes. ? Remove the heat if your skin turns bright red. This is especially important if you are unable to feel pain, heat, or cold. You may have a greater risk of getting burned. Eating and drinking  Drink enough fluid to keep your urine clear or pale yellow.  Resume your normal diet as instructed by your health care provider. Avoid heavy or fried foods that are hard to digest.  Avoid drinking alcohol for as long as instructed by your health care provider. Contact a health care provider if:  You have blood in your stool 2-3 days after the procedure. Get help right away if:  You have more than a small spotting of blood in your stool.  You pass large blood clots in your stool.  Your abdomen is swollen.  You have nausea or vomiting.  You have a fever.  You have increasing abdominal pain that is not relieved with medicine. This information is not intended to replace advice given to you by your health care provider. Make sure you discuss any questions you have with your health care provider. Document Released: 03/23/2004 Document Revised: 05/03/2016 Document Reviewed: 10/21/2015 Elsevier Interactive Patient Education  2018 [employer].  Monitored Anesthesia Care Anesthesia is a  term that refers to techniques, procedures, and medicines that help a person stay safe and comfortable during a medical procedure. Monitored anesthesia care, or sedation, is one type of anesthesia. Your anesthesia specialist may recommend sedation if you will be having a  procedure that does not require you to be unconscious, such as:  Cataract surgery.  A dental procedure.  A biopsy.  A colonoscopy.  During the procedure, you may receive a medicine to help you relax (sedative). There are three levels of sedation:  Mild sedation. At this level, you may feel awake and relaxed. You will be able to follow directions.  Moderate sedation. At this level, you will be sleepy. You may not remember the procedure.  Deep sedation. At this level, you will be asleep. You will not remember the procedure.  The more medicine you are given, the deeper your level of sedation will be. Depending on how you respond to the procedure, the anesthesia specialist may change your level of sedation or the type of anesthesia to fit your needs. An anesthesia specialist will monitor you closely during the procedure. Let your health care provider know about:  Any allergies you have.  All medicines you are taking, including vitamins, herbs, eye drops, creams, and over-the-counter medicines.  Any use of steroids (by mouth or as a cream).  Any problems you or family members have had with sedatives and anesthetic medicines.  Any blood disorders you have.  Any surgeries you have had.  Any medical conditions you have, such as sleep apnea.  Whether you are pregnant or may be pregnant.  Any use of cigarettes, alcohol, or street drugs. What are the risks? Generally, this is a safe procedure. However, problems may occur, including:  Getting too much medicine (oversedation).  Nausea.  Allergic reaction to medicines.  Trouble breathing. If this happens, a breathing tube may be used to help with breathing. It will be removed when you are awake and breathing on your own.  Heart trouble.  Lung trouble.  Before the procedure Staying hydrated Follow instructions from your health care provider about hydration, which may include:  Up to 2 hours before the procedure - you may  continue to drink clear liquids, such as water, clear fruit juice, black coffee, and plain tea.  Eating and drinking restrictions Follow instructions from your health care provider about eating and drinking, which may include:  8 hours before the procedure - stop eating heavy meals or foods such as meat, fried foods, or fatty foods.  6 hours before the procedure - stop eating light meals or foods, such as toast or cereal.  6 hours before the procedure - stop drinking milk or drinks that contain milk.  2 hours before the procedure - stop drinking clear liquids.  Medicines Ask your health care provider about:  Changing or stopping your regular medicines. This is especially important if you are taking diabetes medicines or blood thinners.  Taking medicines such as aspirin and ibuprofen. These medicines can thin your blood. Do not take these medicines before your procedure if your health care provider instructs you not to.  Tests and exams  You will have a physical exam.  You may have blood tests done to show: ? How well your kidneys and liver are working. ? How well your blood can clot.  General instructions  Plan to have someone take you home from the hospital or clinic.  If you will be going home right after the procedure, plan to have someone  with you for 24 hours.  What happens during the procedure?  Your blood pressure, heart rate, breathing, level of pain and overall condition will be monitored.  An IV tube will be inserted into one of your veins.  Your anesthesia specialist will give you medicines as needed to keep you comfortable during the procedure. This may mean changing the level of sedation.  The procedure will be performed. After the procedure  Your blood pressure, heart rate, breathing rate, and blood oxygen level will be monitored until the medicines you were given have worn off.  Do not drive for 24 hours if you received a sedative.  You may: ? Feel  sleepy, clumsy, or nauseous. ? Feel forgetful about what happened after the procedure. ? Have a sore throat if you had a breathing tube during the procedure. ? Vomit. This information is not intended to replace advice given to you by your health care provider. Make sure you discuss any questions you have with your health care provider. Document Released: 05/05/2005 Document Revised: 01/16/2016 Document Reviewed: 11/30/2015 Elsevier Interactive Patient Education  2018 [place], Care After These instructions provide you with information about caring for yourself after your procedure. Your health care provider may also give you more specific instructions. Your treatment has been planned according to current medical practices, but problems sometimes occur. Call your health care provider if you have any problems or questions after your procedure. What can I expect after the procedure? After your procedure, it is common to:  Feel sleepy for several hours.  Feel clumsy and have poor balance for several hours.  Feel forgetful about what happened after the procedure.  Have poor judgment for several hours.  Feel nauseous or vomit.  Have a sore throat if you had a breathing tube during the procedure.  Follow these instructions at home: For at least 24 hours after the procedure:   Do not: ? Participate in activities in which you could fall or become injured. ? Drive. ? Use heavy machinery. ? Drink alcohol. ? Take sleeping pills or medicines that cause drowsiness. ? Make important decisions or sign legal documents. ? Take care of children on your own.  Rest. Eating and drinking  Follow the diet that is recommended by your health care provider.  If you vomit, drink water, juice, or soup when you can drink without vomiting.  Make sure you have little or no nausea before eating solid foods. General instructions  Have a responsible adult stay with you  until you are awake and alert.  Take over-the-counter and prescription medicines only as told by your health care provider.  If you smoke, do not smoke without supervision.  Keep all follow-up visits as told by your health care provider. This is important. Contact a health care provider if:  You keep feeling nauseous or you keep vomiting.  You feel light-headed.  You develop a rash.  You have a fever. Get help right away if:  You have trouble breathing. This information is not intended to replace advice given to you by your health care provider. Make sure you discuss any questions you have with your health care provider. Document Released: 11/30/2015 Document Revised: 03/31/2016 Document Reviewed: 11/30/2015 Elsevier Interactive Patient Education  [employer].

## 2017-10-31 ENCOUNTER — Encounter (HOSPITAL_COMMUNITY)
Admission: RE | Admit: 2017-10-31 | Discharge: 2017-10-31 | Disposition: A | Discharge status: Home / Self Care | Payer: Medicare HMO | Source: Ambulatory Visit | Attending: Internal Medicine | Admitting: Internal Medicine

## 2017-10-31 ENCOUNTER — Other Ambulatory Visit: Payer: Self-pay

## 2017-10-31 ENCOUNTER — Encounter (HOSPITAL_COMMUNITY): Payer: Self-pay

## 2017-10-31 DIAGNOSIS — I6350 Cerebral infarction due to unspecified occlusion or stenosis of unspecified cerebral artery: Secondary | ICD-10-CM | POA: Diagnosis not present

## 2017-10-31 DIAGNOSIS — Z01812 Encounter for preprocedural laboratory examination: Secondary | ICD-10-CM | POA: Diagnosis present

## 2017-10-31 DIAGNOSIS — M86671 Other chronic osteomyelitis, right ankle and foot: Secondary | ICD-10-CM | POA: Diagnosis not present

## 2017-10-31 DIAGNOSIS — S82309A Unspecified fracture of lower end of unspecified tibia, initial encounter for closed fracture: Secondary | ICD-10-CM | POA: Diagnosis not present

## 2017-10-31 DIAGNOSIS — M6281 Muscle weakness (generalized): Secondary | ICD-10-CM | POA: Diagnosis not present

## 2017-10-31 DIAGNOSIS — K7460 Unspecified cirrhosis of liver: Secondary | ICD-10-CM | POA: Diagnosis not present

## 2017-10-31 DIAGNOSIS — E1149 Type 2 diabetes mellitus with other diabetic neurological complication: Secondary | ICD-10-CM | POA: Diagnosis not present

## 2017-10-31 DIAGNOSIS — R262 Difficulty in walking, not elsewhere classified: Secondary | ICD-10-CM | POA: Diagnosis not present

## 2017-10-31 DIAGNOSIS — J449 Chronic obstructive pulmonary disease, unspecified: Secondary | ICD-10-CM | POA: Diagnosis not present

## 2017-10-31 DIAGNOSIS — E11621 Type 2 diabetes mellitus with foot ulcer: Secondary | ICD-10-CM | POA: Diagnosis not present

## 2017-10-31 DIAGNOSIS — E1165 Type 2 diabetes mellitus with hyperglycemia: Secondary | ICD-10-CM | POA: Diagnosis not present

## 2017-10-31 HISTORY — DX: Unspecified dementia, unspecified severity, without behavioral disturbance, psychotic disturbance, mood disturbance, and anxiety: F03.90

## 2017-10-31 HISTORY — DX: Inflammatory liver disease, unspecified: K75.9

## 2017-10-31 HISTORY — DX: Dyspnea, unspecified: R06.00

## 2017-10-31 LAB — CBC WITH DIFFERENTIAL/PLATELET
BASOS ABS: 0 10*3/uL (ref 0.0–0.1)
BASOS PCT: 1 %
EOS ABS: 0.2 10*3/uL (ref 0.0–0.7)
EOS PCT: 6 %
HCT: 44.1 % (ref 39.0–52.0)
Hemoglobin: 14.7 g/dL (ref 13.0–17.0)
LYMPHS PCT: 35 %
Lymphs Abs: 1.2 10*3/uL (ref 0.7–4.0)
MCH: 34.1 pg — ABNORMAL HIGH (ref 26.0–34.0)
MCHC: 33.3 g/dL (ref 30.0–36.0)
MCV: 102.3 fL — ABNORMAL HIGH (ref 78.0–100.0)
Monocytes Absolute: 0.5 10*3/uL (ref 0.1–1.0)
Monocytes Relative: 14 %
Neutro Abs: 1.6 10*3/uL — ABNORMAL LOW (ref 1.7–7.7)
Neutrophils Relative %: 44 %
PLATELETS: 71 10*3/uL — AB (ref 150–400)
RBC: 4.31 MIL/uL (ref 4.22–5.81)
RDW: 13.9 % (ref 11.5–15.5)
WBC: 3.5 10*3/uL — AB (ref 4.0–10.5)

## 2017-10-31 LAB — BASIC METABOLIC PANEL
ANION GAP: 10 (ref 5–15)
BUN: 9 mg/dL (ref 6–20)
CALCIUM: 9.1 mg/dL (ref 8.9–10.3)
CO2: 22 mmol/L (ref 22–32)
Chloride: 106 mmol/L (ref 101–111)
Creatinine, Ser: 0.62 mg/dL (ref 0.61–1.24)
Glucose, Bld: 146 mg/dL — ABNORMAL HIGH (ref 65–99)
Potassium: 3.3 mmol/L — ABNORMAL LOW (ref 3.5–5.1)
SODIUM: 138 mmol/L (ref 135–145)

## 2017-11-01 NOTE — Progress Notes (Signed)
MELD Na 11. Ferritin not significantly elevated. sats are 43, will follow. AMA, ASMA, ANA negative. Hep B surface antibody negative. Needs Hep B core antibody, which I don't see here. Needs Hep A vaccinations and likely Hep B. Awaiting Hep B core antibody.

## 2017-11-01 NOTE — H&P (View-Only) (Signed)
MELD Na 11. Ferritin not significantly elevated. sats are 72, will follow. AMA, ASMA, ANA negative. Hep B surface antibody negative. Needs Hep B core antibody, which I don't see here. Needs Hep A vaccinations and likely Hep B. Awaiting Hep B core antibody.

## 2017-11-07 ENCOUNTER — Encounter (HOSPITAL_COMMUNITY): Payer: Self-pay | Admitting: *Deleted

## 2017-11-07 ENCOUNTER — Ambulatory Visit (HOSPITAL_COMMUNITY): Payer: Medicare HMO | Admitting: Anesthesiology

## 2017-11-07 ENCOUNTER — Ambulatory Visit (HOSPITAL_COMMUNITY)
Admission: RE | Admit: 2017-11-07 | Discharge: 2017-11-07 | Disposition: A | Discharge status: Home / Self Care | Payer: Medicare HMO | Source: Ambulatory Visit | Attending: Internal Medicine | Admitting: Internal Medicine

## 2017-11-07 ENCOUNTER — Encounter (HOSPITAL_COMMUNITY)
Admission: RE | Disposition: A | Discharge status: Home / Self Care | Payer: Self-pay | Source: Ambulatory Visit | Attending: Internal Medicine

## 2017-11-07 ENCOUNTER — Other Ambulatory Visit: Payer: Self-pay

## 2017-11-07 DIAGNOSIS — I10 Essential (primary) hypertension: Secondary | ICD-10-CM | POA: Diagnosis not present

## 2017-11-07 DIAGNOSIS — K766 Portal hypertension: Secondary | ICD-10-CM | POA: Insufficient documentation

## 2017-11-07 DIAGNOSIS — E119 Type 2 diabetes mellitus without complications: Secondary | ICD-10-CM | POA: Insufficient documentation

## 2017-11-07 DIAGNOSIS — F419 Anxiety disorder, unspecified: Secondary | ICD-10-CM | POA: Insufficient documentation

## 2017-11-07 DIAGNOSIS — F0390 Unspecified dementia without behavioral disturbance: Secondary | ICD-10-CM | POA: Insufficient documentation

## 2017-11-07 DIAGNOSIS — J449 Chronic obstructive pulmonary disease, unspecified: Secondary | ICD-10-CM | POA: Diagnosis not present

## 2017-11-07 DIAGNOSIS — R69 Illness, unspecified: Secondary | ICD-10-CM | POA: Diagnosis not present

## 2017-11-07 DIAGNOSIS — K7460 Unspecified cirrhosis of liver: Secondary | ICD-10-CM | POA: Diagnosis not present

## 2017-11-07 DIAGNOSIS — F319 Bipolar disorder, unspecified: Secondary | ICD-10-CM | POA: Insufficient documentation

## 2017-11-07 DIAGNOSIS — Z1211 Encounter for screening for malignant neoplasm of colon: Secondary | ICD-10-CM | POA: Diagnosis not present

## 2017-11-07 DIAGNOSIS — K640 First degree hemorrhoids: Secondary | ICD-10-CM | POA: Insufficient documentation

## 2017-11-07 DIAGNOSIS — F17210 Nicotine dependence, cigarettes, uncomplicated: Secondary | ICD-10-CM | POA: Insufficient documentation

## 2017-11-07 DIAGNOSIS — E785 Hyperlipidemia, unspecified: Secondary | ICD-10-CM | POA: Diagnosis not present

## 2017-11-07 DIAGNOSIS — Z79899 Other long term (current) drug therapy: Secondary | ICD-10-CM | POA: Diagnosis not present

## 2017-11-07 DIAGNOSIS — Z1212 Encounter for screening for malignant neoplasm of rectum: Secondary | ICD-10-CM

## 2017-11-07 DIAGNOSIS — R1310 Dysphagia, unspecified: Secondary | ICD-10-CM | POA: Insufficient documentation

## 2017-11-07 DIAGNOSIS — K3189 Other diseases of stomach and duodenum: Secondary | ICD-10-CM | POA: Insufficient documentation

## 2017-11-07 DIAGNOSIS — Z7982 Long term (current) use of aspirin: Secondary | ICD-10-CM | POA: Diagnosis not present

## 2017-11-07 DIAGNOSIS — Z538 Procedure and treatment not carried out for other reasons: Secondary | ICD-10-CM | POA: Diagnosis not present

## 2017-11-07 DIAGNOSIS — K31819 Angiodysplasia of stomach and duodenum without bleeding: Secondary | ICD-10-CM | POA: Diagnosis not present

## 2017-11-07 HISTORY — PX: COLONOSCOPY WITH PROPOFOL: SHX5780

## 2017-11-07 HISTORY — PX: MALONEY DILATION: SHX5535

## 2017-11-07 HISTORY — PX: ESOPHAGOGASTRODUODENOSCOPY (EGD) WITH PROPOFOL: SHX5813

## 2017-11-07 LAB — GLUCOSE, CAPILLARY
GLUCOSE-CAPILLARY: 87 mg/dL (ref 65–99)
Glucose-Capillary: 97 mg/dL (ref 65–99)

## 2017-11-07 SURGERY — COLONOSCOPY WITH PROPOFOL
Anesthesia: Monitor Anesthesia Care

## 2017-11-07 MED ORDER — LIDOCAINE VISCOUS 2 % MT SOLN: 6.0000 mL | Freq: Once | OROMUCOSAL | Status: DC

## 2017-11-07 MED ORDER — DEXTROSE 50 % IV SOLN
INTRAVENOUS | Status: AC
  Filled 2017-11-07: qty 50

## 2017-11-07 MED ORDER — PROPOFOL 10 MG/ML IV BOLUS
INTRAVENOUS | Status: AC
  Filled 2017-11-07: qty 20

## 2017-11-07 MED ORDER — DEXTROSE 50 % IV SOLN
25.0000 mL | Freq: Once | INTRAVENOUS | Status: AC
  Administered 2017-11-07: 25 mL via INTRAVENOUS

## 2017-11-07 MED ORDER — FENTANYL CITRATE (PF) 100 MCG/2ML IJ SOLN
25.0000 ug | Freq: Once | INTRAMUSCULAR | Status: AC
  Administered 2017-11-07: 25 ug via INTRAVENOUS

## 2017-11-07 MED ORDER — FENTANYL CITRATE (PF) 100 MCG/2ML IJ SOLN
INTRAMUSCULAR | Status: AC
  Filled 2017-11-07: qty 2

## 2017-11-07 MED ORDER — MIDAZOLAM HCL 2 MG/2ML IJ SOLN
INTRAMUSCULAR | Status: AC
  Filled 2017-11-07: qty 2

## 2017-11-07 MED ORDER — LACTATED RINGERS IV SOLN
INTRAVENOUS | Status: DC
  Administered 2017-11-07: 11:00:00 via INTRAVENOUS

## 2017-11-07 MED ORDER — LIDOCAINE VISCOUS 2 % MT SOLN
OROMUCOSAL | Status: AC
  Filled 2017-11-07: qty 15

## 2017-11-07 MED ORDER — LIDOCAINE VISCOUS 2 % MT SOLN
OROMUCOSAL | Status: DC | PRN
  Administered 2017-11-07: 1 via OROMUCOSAL

## 2017-11-07 MED ORDER — MIDAZOLAM HCL 2 MG/2ML IJ SOLN
1.0000 mg | INTRAMUSCULAR | Status: AC
  Administered 2017-11-07: 2 mg via INTRAVENOUS

## 2017-11-07 MED ORDER — PROPOFOL 500 MG/50ML IV EMUL
INTRAVENOUS | Status: DC | PRN
  Administered 2017-11-07: 200 ug/kg/min via INTRAVENOUS

## 2017-11-07 NOTE — Op Note (Signed)
[facility] Patient Name: [name] [name] Procedure Date: 11/07/2017 11:40 AM MRN: [id number] Date of Birth: 1960-11-24 Attending MD: Norvel Richards , MD CSN: [id number] Age: 57 Admit Type: Outpatient Procedure:                Colonoscopy Indications:              Screening for colorectal malignant neoplasm Providers:                Norvel Richards, MD, Janeece Riggers, RN, Aram Candela Referring MD:              Medicines:                Propofol per Anesthesia Complications:            No immediate complications. Estimated Blood Loss:     Estimated blood loss: none. Procedure:                Pre-Anesthesia Assessment:                           - Prior to the procedure, a History and Physical                            was performed, and patient medications and                            allergies were reviewed. The patient's tolerance of                            previous anesthesia was also reviewed. The risks                            and benefits of the procedure and the sedation                            options and risks were discussed with the patient.                            All questions were answered, and informed consent                            was obtained. Prior Anticoagulants: The patient has                            taken no previous anticoagulant or antiplatelet                            agents. ASA Grade Assessment: II - A patient with                            mild systemic disease. After reviewing the risks  and benefits, the patient was deemed in                            satisfactory condition to undergo the procedure.                           After obtaining informed consent, the colonoscope                            was passed under direct vision. Throughout the                            procedure, the patient's blood pressure, pulse, and                            oxygen  saturations were monitored continuously. The                            EC-[id number] ([id number]) scope was introduced through                            the and advanced to the the cecum, identified by                            appendiceal orifice and ileocecal valve. The                            ileocecal valve, appendiceal orifice, and rectum                            were photographed. The colonoscopy was performed                            without difficulty. The patient tolerated the                            procedure well. The quality of the bowel                            preparation was inadequate. Scope In: 11:41:43 AM Scope Out: 69:67:89 AM Scope Withdrawal Time: 0 hours 10 minutes 11 seconds  Total Procedure Duration: 0 hours 14 minutes 25 seconds  Findings:      The perianal and digital rectal examinations were normal.      The colon (entire examined portion) appeared normal. inadequate       preparation precluded examination of all the colon surface.      Internal hemorrhoids were found during retroflexion. The hemorrhoids       were Grade I (internal hemorrhoids that do not prolapse).      No additional abnormalities were found on retroflexion. Impression:               - The entire examined colon is normal but                            incompletely seen  due to inadequate preparation..                           - Internal hemorrhoids.                           - No specimens collected. Moderate Sedation:      Moderate (conscious) sedation was personally administered by an       anesthesia professional. The following parameters were monitored: oxygen       saturation, heart rate, blood pressure, respiratory rate, EKG, adequacy       of pulmonary ventilation, and response to care. Total physician       intraservice time was 28 minutes. Recommendation:           - Patient has a contact number available for                            emergencies. The signs and symptoms of  potential                            delayed complications were discussed with the                            patient. Return to normal activities tomorrow.                            Written discharge instructions were provided to the                            patient.                           - Advance diet as tolerated.                           - Continue present medications.                           - Repeat colonoscopy in 1 year for screening                            purposes.                           - Return to GI clinic in 6 weeks. See EGD report. Procedure Code(s):        --- Professional ---                           [phone], Colonoscopy, flexible; diagnostic, including                            collection of specimen(s) by brushing or washing,                            when performed (separate procedure) Diagnosis Code(s):        --- Professional ---  Z12.11, Encounter for screening for malignant                            neoplasm of colon                           K64.0, First degree hemorrhoids CPT copyright 2016 American Medical Association. All rights reserved. The codes documented in this report are preliminary and upon coder review may  be revised to meet current compliance requirements. Cristopher Estimable. Shayona Hibbitts, MD Norvel Richards, MD 11/07/2017 12:06:50 PM This report has been signed electronically. Number of Addenda: 0

## 2017-11-07 NOTE — Discharge Instructions (Signed)
°Colonoscopy °Discharge Instructions ° °Read the instructions outlined below and refer to this sheet in the next few weeks. These discharge instructions provide you with general information on caring for yourself after you leave the hospital. Your doctor may also give you specific instructions. While your treatment has been planned according to the most current medical practices available, unavoidable complications occasionally occur. If you have any problems or questions after discharge, call Dr. Rourk at [phone]. °ACTIVITY °· You may resume your regular activity, but move at a slower pace for the next 24 hours.  °· Take frequent rest periods for the next 24 hours.  °· Walking will help get rid of the air and reduce the bloated feeling in your belly (abdomen).  °· No driving for 24 hours (because of the medicine (anesthesia) used during the test).   °· Do not sign any important legal documents or operate any machinery for 24 hours (because of the anesthesia used during the test).  °NUTRITION °· Drink plenty of fluids.  °· You may resume your normal diet as instructed by your doctor.  °· Begin with a light meal and progress to your normal diet. Heavy or fried foods are harder to digest and may make you feel sick to your stomach (nauseated).  °· Avoid alcoholic beverages for 24 hours or as instructed.  °MEDICATIONS °· You may resume your normal medications unless your doctor tells you otherwise.  °WHAT YOU CAN EXPECT TODAY °· Some feelings of bloating in the abdomen.  °· Passage of more gas than usual.  °· Spotting of blood in your stool or on the toilet paper.  °IF YOU HAD POLYPS REMOVED DURING THE COLONOSCOPY: °· No aspirin products for 7 days or as instructed.  °· No alcohol for 7 days or as instructed.  °· Eat a soft diet for the next 24 hours.  °FINDING OUT THE RESULTS OF YOUR TEST °Not all test results are available during your visit. If your test results are not back during the visit, make an appointment  with your caregiver to find out the results. Do not assume everything is normal if you have not heard from your caregiver or the medical facility. It is important for you to follow up on all of your test results.  °SEEK IMMEDIATE MEDICAL ATTENTION IF: °· You have more than a spotting of blood in your stool.  °· Your belly is swollen (abdominal distention).  °· You are nauseated or vomiting.  °· You have a temperature over 101.  °· You have abdominal pain or discomfort that is severe or gets worse throughout the day.  °EGD °Discharge instructions °Please read the instructions outlined below and refer to this sheet in the next few weeks. These discharge instructions provide you with general information on caring for yourself after you leave the hospital. Your doctor may also give you specific instructions. While your treatment has been planned according to the most current medical practices available, unavoidable complications occasionally occur. If you have any problems or questions after discharge, please call your doctor. °ACTIVITY °· You may resume your regular activity but move at a slower pace for the next 24 hours.  °· Take frequent rest periods for the next 24 hours.  °· Walking will help expel (get rid of) the air and reduce the bloated feeling in your abdomen.  °· No driving for 24 hours (because of the anesthesia (medicine) used during the test).  °· You may shower.  °· Do not sign any important   legal documents or operate any machinery for 24 hours (because of the anesthesia used during the test).  NUTRITION  Drink plenty of fluids.   You may resume your normal diet.   Begin with a light meal and progress to your normal diet.   Avoid alcoholic beverages for 24 hours or as instructed by your caregiver.  MEDICATIONS  You may resume your normal medications unless your caregiver tells you otherwise.  WHAT YOU CAN EXPECT TODAY  You may experience abdominal discomfort such as a feeling of fullness  or gas pains.  FOLLOW-UP  Your doctor will discuss the results of your test with you.  SEEK IMMEDIATE MEDICAL ATTENTION IF ANY OF THE FOLLOWING OCCUR:  Excessive nausea (feeling sick to your stomach) and/or vomiting.   Severe abdominal pain and distention (swelling).   Trouble swallowing.   Temperature over 101 F (37.8 C).   Rectal bleeding or vomiting of blood.    Moderate Conscious Sedation, Adult, Care After These instructions provide you with information about caring for yourself after your procedure. Your health care provider may also give you more specific instructions. Your treatment has been planned according to current medical practices, but problems sometimes occur. Call your health care provider if you have any problems or questions after your procedure. What can I expect after the procedure? After your procedure, it is common:  To feel sleepy for several hours.  To feel clumsy and have poor balance for several hours.  To have poor judgment for several hours.  To vomit if you eat too soon.  Follow these instructions at home: For at least 24 hours after the procedure:   Do not: ? Participate in activities where you could fall or become injured. ? Drive. ? Use heavy machinery. ? Drink alcohol. ? Take sleeping pills or medicines that cause drowsiness. ? Make important decisions or sign legal documents. ? Take care of children on your own.  Rest. Eating and drinking  Follow the diet recommended by your health care provider.  If you vomit: ? Drink water, juice, or soup when you can drink without vomiting. ? Make sure you have little or no nausea before eating solid foods. General instructions  Have a responsible adult stay with you until you are awake and alert.  Take over-the-counter and prescription medicines only as told by your health care provider.  If you smoke, do not smoke without supervision.  Keep all follow-up visits as told by your  health care provider. This is important. Contact a health care provider if:  You keep feeling nauseous or you keep vomiting.  You feel light-headed.  You develop a rash.  You have a fever. Get help right away if:  You have trouble breathing. This information is not intended to replace advice given to you by your health care provider. Make sure you discuss any questions you have with your health care provider. Document Released: 05/30/2013 Document Revised: 01/12/2016 Document Reviewed: 11/29/2015 Elsevier Interactive Patient Education  [employer].    Repeat colonoscopy in one year because prep was poor today.  Office visit with [place] in 6 weeks  Further recomendations to follow pending review of pathology report

## 2017-11-07 NOTE — Anesthesia Preprocedure Evaluation (Signed)
Anesthesia Evaluation  Patient identified by MRN, date of birth, ID band Patient awake    Reviewed: Allergy & Precautions, NPO status , Patient's Chart, lab work & pertinent test results  Airway Mallampati: III  TM Distance: >3 FB Neck ROM: Full    Dental  (+) Edentulous Upper, Edentulous Lower   Pulmonary shortness of breath, COPD, Current Smoker,    breath sounds clear to auscultation       Cardiovascular hypertension, Pt. on medications  Rhythm:Regular Rate:Normal     Neuro/Psych Seizures -,  PSYCHIATRIC DISORDERS Anxiety Depression Bipolar Disorder Dementia    GI/Hepatic GERD  ,(+) Cirrhosis       , Hepatitis -  Endo/Other  diabetes  Renal/GU      Musculoskeletal   Abdominal   Peds  Hematology   Anesthesia Other Findings   Reproductive/Obstetrics                             Anesthesia Physical Anesthesia Plan  ASA: III  Anesthesia Plan: MAC   Post-op Pain Management:    Induction: Intravenous  PONV Risk Score and Plan:   Airway Management Planned: Simple Face Mask  Additional Equipment:   Intra-op Plan:   Post-operative Plan:   Informed Consent: I have reviewed the patients History and Physical, chart, labs and discussed the procedure including the risks, benefits and alternatives for the proposed anesthesia with the patient or authorized representative who has indicated his/her understanding and acceptance.     Plan Discussed with:   Anesthesia Plan Comments:         Anesthesia Quick Evaluation

## 2017-11-07 NOTE — Op Note (Signed)
[facility] Patient Name: [name] [name] Procedure Date: 11/07/2017 11:07 AM MRN: [id number] Date of Birth: [birth year] Attending MD: Norvel Richards , MD CSN: [id number] Age: 57 Admit Type: Outpatient Procedure:                Upper GI endoscopy Indications:              Dysphagia Providers:                Norvel Richards, MD, Janeece Riggers, RN, Aram Candela Referring MD:             Hermine Messick Medicines:                Propofol per Anesthesia Complications:            No immediate complications. Estimated Blood Loss:     Estimated blood loss was minimal. Procedure:                Pre-Anesthesia Assessment:                           - Prior to the procedure, a History and Physical                            was performed, and patient medications and                            allergies were reviewed. The patient's tolerance of                            previous anesthesia was also reviewed. The risks                            and benefits of the procedure and the sedation                            options and risks were discussed with the patient.                            All questions were answered, and informed consent                            was obtained. Prior Anticoagulants: The patient has                            taken no previous anticoagulant or antiplatelet                            agents. ASA Grade Assessment: III - A patient with                            severe systemic disease. After reviewing the risks  and benefits, the patient was deemed in                            satisfactory condition to undergo the procedure.                           After obtaining informed consent, the endoscope was                            passed under direct vision. Throughout the                            procedure, the patient's blood pressure, pulse, and                            oxygen saturations  were monitored continuously. The                            [id number]) scope was introduced through the                            and advanced to the second part of duodenum. The                            upper GI endoscopy was accomplished without                            difficulty. The patient tolerated the procedure                            well. Scope In: 11:31:53 AM Scope Out: 11:37:33 AM Total Procedure Duration: 0 hours 5 minutes 40 seconds  Findings:      The examined esophagus was normal.      Portal hypertensive gastropathy was found in the stomach.      Multiple erosions were found in the gastric antrum.      The duodenal bulb and second portion of the duodenum were normal.       Estimated blood loss was minimal. The scope was withdrawn. Dilation was       performed with a Maloney dilator with mild resistance at 56 Fr. The       dilation site was examined and showed no change. Estimated blood loss:       none. This was biopsied with a cold forceps for histology. Estimated       blood loss was minimal. Impression:               - Normal esophagus. Dilated.                           - Portal hypertensive gastropathy.                           - Erosive gastropathy. Biopsied.                           - Normal duodenal bulb and second portion of  the                            duodenum. Moderate Sedation:      Moderate (conscious) sedation was personally administered by an       anesthesia professional. The following parameters were monitored: oxygen       saturation, heart rate, blood pressure, respiratory rate, EKG, adequacy       of pulmonary ventilation, and response to care. Total physician       intraservice time was 14 minutes. Recommendation:           - Patient has a contact number available for                            emergencies. The signs and symptoms of potential                            delayed complications were discussed with the                             patient. Return to normal activities tomorrow.                            Written discharge instructions were provided to the                            patient.                           - Advance diet as tolerated.                           - Continue present medications.                           - Await pathology results.                           - No repeat upper endoscopy.                           - Return to GI office in 6 weeks. Procedure Code(s):        --- Professional ---                           [phone], Esophagogastroduodenoscopy, flexible,                            transoral; with biopsy, single or multiple                           43450, Dilation of esophagus, by unguided sound or                            bougie, single or multiple passes Diagnosis Code(s):        --- Professional ---  K76.6, Portal hypertension                           K31.89, Other diseases of stomach and duodenum                           R13.10, Dysphagia, unspecified CPT copyright 2016 American Medical Association. All rights reserved. The codes documented in this report are preliminary and upon coder review may  be revised to meet current compliance requirements. Cristopher Estimable. Teirra Carapia, MD Norvel Richards, MD 11/07/2017 12:09:00 PM This report has been signed electronically. Number of Addenda: 0

## 2017-11-07 NOTE — Anesthesia Postprocedure Evaluation (Signed)
Anesthesia Post Note  Patient: [name] [name]  Procedure(s) Performed: COLONOSCOPY WITH PROPOFOL (N/A ) ESOPHAGOGASTRODUODENOSCOPY (EGD) WITH PROPOFOL (N/A ) MALONEY DILATION (N/A )  Patient location during evaluation: PACU Anesthesia Type: MAC Level of consciousness: awake and alert and oriented Pain management: pain level controlled Vital Signs Assessment: post-procedure vital signs reviewed and stable Respiratory status: spontaneous breathing, nonlabored ventilation, respiratory function stable and patient connected to nasal cannula oxygen Cardiovascular status: stable Postop Assessment: no apparent nausea or vomiting Anesthetic complications: no     Last Vitals:  Vitals:   11/07/17 1125 11/07/17 1201  BP:    Pulse:  (P) 67  Resp: 14 (P) 12  Temp:  (P) 36.9 C  SpO2: 98% (P) 96%    Last Pain:  Vitals:   11/07/17 1037  PainSc: 2                  Debara Kamphuis

## 2017-11-07 NOTE — Transfer of Care (Signed)
Immediate Anesthesia Transfer of Care Note  Patient: [name] [name]  Procedure(s) Performed: COLONOSCOPY WITH PROPOFOL (N/A ) ESOPHAGOGASTRODUODENOSCOPY (EGD) WITH PROPOFOL (N/A ) MALONEY DILATION (N/A )  Patient Location: PACU  Anesthesia Type:MAC  Level of Consciousness: awake, alert  and oriented  Airway & Oxygen Therapy: Patient Spontanous Breathing and Patient connected to nasal cannula oxygen  Post-op Assessment: Report given to RN and Post -op Vital signs reviewed and stable  Post vital signs: Reviewed and stable  Last Vitals:  Vitals:   11/07/17 1120 11/07/17 1125  BP: 135/71   Resp: (!) 0 14  SpO2: 95% 98%    Last Pain:  Vitals:   11/07/17 1037  PainSc: 2       Patients Stated Pain Goal: 7 (93/71/69 6789)  Complications: No apparent anesthesia complications

## 2017-11-07 NOTE — Interval H&P Note (Signed)
History and Physical Interval Note:  11/07/2017 11:21 AM  [name] [name]  has presented today for surgery, with the diagnosis of cirrhosis, screening colonoscopy  The various methods of treatment have been discussed with the patient and family. After consideration of risks, benefits and other options for treatment, the patient has consented to  Procedure(s) with comments: COLONOSCOPY WITH PROPOFOL (N/A) - 12:30pm ESOPHAGOGASTRODUODENOSCOPY (EGD) WITH PROPOFOL (N/A) MALONEY DILATION (N/A) as a surgical intervention .  The patient's history has been reviewed, patient examined, no change in status, stable for surgery.  I have reviewed the patient's chart and labs.  Questions were answered to the patient's satisfaction.     [name] [name]  No change. EGD with ED and screening colonoscopy per plan.  The risks, benefits, limitations, imponderables and alternatives regarding both EGD and colonoscopy have been reviewed with the patient. Questions have been answered. All parties agreeable.

## 2017-11-08 NOTE — Addendum Note (Signed)
Addendum  created 11/08/17 0811 by Vista Deck, CRNA   Charge Capture section accepted

## 2017-11-10 ENCOUNTER — Encounter: Payer: Self-pay | Admitting: Internal Medicine

## 2017-11-11 ENCOUNTER — Encounter (HOSPITAL_COMMUNITY): Payer: Self-pay | Admitting: Internal Medicine

## 2017-11-22 ENCOUNTER — Other Ambulatory Visit: Payer: Self-pay

## 2017-11-22 DIAGNOSIS — K7460 Unspecified cirrhosis of liver: Secondary | ICD-10-CM

## 2017-11-22 NOTE — Progress Notes (Signed)
Yes, needs Hep B core antibody lab. The order was in there with it, so I don't know if it was overlooked?

## 2017-11-23 ENCOUNTER — Other Ambulatory Visit: Payer: Self-pay

## 2017-11-23 DIAGNOSIS — K7460 Unspecified cirrhosis of liver: Secondary | ICD-10-CM

## 2017-12-01 DIAGNOSIS — M86671 Other chronic osteomyelitis, right ankle and foot: Secondary | ICD-10-CM | POA: Diagnosis not present

## 2017-12-01 DIAGNOSIS — R262 Difficulty in walking, not elsewhere classified: Secondary | ICD-10-CM | POA: Diagnosis not present

## 2017-12-01 DIAGNOSIS — E1165 Type 2 diabetes mellitus with hyperglycemia: Secondary | ICD-10-CM | POA: Diagnosis not present

## 2017-12-01 DIAGNOSIS — S82309A Unspecified fracture of lower end of unspecified tibia, initial encounter for closed fracture: Secondary | ICD-10-CM | POA: Diagnosis not present

## 2017-12-01 DIAGNOSIS — J449 Chronic obstructive pulmonary disease, unspecified: Secondary | ICD-10-CM | POA: Diagnosis not present

## 2017-12-01 DIAGNOSIS — E11621 Type 2 diabetes mellitus with foot ulcer: Secondary | ICD-10-CM | POA: Diagnosis not present

## 2017-12-01 DIAGNOSIS — I6350 Cerebral infarction due to unspecified occlusion or stenosis of unspecified cerebral artery: Secondary | ICD-10-CM | POA: Diagnosis not present

## 2017-12-01 DIAGNOSIS — K7460 Unspecified cirrhosis of liver: Secondary | ICD-10-CM | POA: Diagnosis not present

## 2017-12-01 DIAGNOSIS — M6281 Muscle weakness (generalized): Secondary | ICD-10-CM | POA: Diagnosis not present

## 2017-12-01 DIAGNOSIS — E1149 Type 2 diabetes mellitus with other diabetic neurological complication: Secondary | ICD-10-CM | POA: Diagnosis not present

## 2017-12-12 ENCOUNTER — Ambulatory Visit: Payer: Medicare HMO | Admitting: Gastroenterology

## 2017-12-12 ENCOUNTER — Encounter: Payer: Self-pay | Admitting: Gastroenterology

## 2017-12-12 VITALS — BP 181/84 | HR 66 | Temp 97.3°F | Ht 67.0 in | Wt 222.2 lb

## 2017-12-12 DIAGNOSIS — K7460 Unspecified cirrhosis of liver: Secondary | ICD-10-CM | POA: Diagnosis not present

## 2017-12-12 NOTE — Progress Notes (Signed)
Primary Care Physician:  Hermine Messick, MD  Primary GI: Dr. Gala Romney   Chief Complaint  Patient presents with  . Cirrhosis  . Dysphagia    HPI:   CRESPIN FORSTROM is a 57 y.o. 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 AMA Positive in past but recent serologies with AMA, ASMA, ANA negative. Hep B surface antibody negative, Hep B surface antigen negative. Needs Hep B core antibody. Ferritin not significantly elevated, and sats 42. Will follow serially. HIV negative in 2018. Hep C negative 2011. MELD Na 03 Oct 2017. History of encephalopathy.   Completed EGD recently with normal esophagus s/p dilation, portal hypertensive gastropathy, erosive gastropathy, normal duodenum. Colonoscopy inadequate prep and due for early interval March 2020. Still has not completed ultrasound of abdomen. Markedly overdue for imaging.   Wife present with him and states he thought he was supposed to save his urine and "drink it". Confused this morning. Comes and goes. Wife thought it may be his dementia. Taking lactulose three times a day. "right many" bowel movements per day. Eating well. Wife states he "shakes" his hands at times. DID NOT TAKE LACTULOSE today or BP meds as he refused due to upcoming office visit. Doesn't like to take meds when he has a visit that day.   Denies fever, chills, abdominal pain. Repeats himself several times today but knows who the president is. He does not know the year. Able to make eye contact. Oriented to person. No overt GI bleeding. No signs/symptoms of infection. Confusion main issue today. Multiple other health issues to include mental health issues, dementia, mentally challenged. Wife tells me that they are out of oxycodone and don't take more than 2 a day.   Past Medical History:  Diagnosis Date  . Anemia   . Anxiety   . Arthritis   . Bipolar 1 disorder ([place])   . Blind right eye   . Blood clots in brain    per patient  . Cardiomegaly   .  Cataract    Right eye  . Cerebrovascular disease    Right vertebral artery  . Cholelithiasis    Asymptomatic  . Chronic back pain   . Cirrhosis ([place])    suspected NASH  . Cirrhosis ([place])    diagnosed 07/2010 when presented with first episode of hepatic encephalopathy, afp on 416/13=4.8,  U/S on 12/14/11  liver stable, pt has received 2 hep A/B vaccines  . COPD (chronic obstructive pulmonary disease) ([place])   . Dementia   . Depression   . Diabetes mellitus   . Diverticulosis   . Dyspnea   . GERD (gastroesophageal reflux disease)   . Hard of hearing   . Hepatic encephalopathy ([place])   . Hepatitis   . Hyperlipidemia   . Hypertension   . Mental retardation   . Seizures ([place])   . Thrombocytopenia ([place])     Past Surgical History:  Procedure Laterality Date  . CAST APPLICATION  05/01/3569   Procedure: MINOR CAST APPLICATION;  Surgeon: [name], MD;  Location: [facility];  Service: Orthopedics;  Laterality: Right;  no anesthesia , procedure room do not need or room !  . COLONOSCOPY  12/15/11   colonic divericulosis;poor prep, ACBE  recommended but has not been done  . COLONOSCOPY WITH PROPOFOL N/A 11/07/2017   Dr. Gala Romney: inadequate prep, internal hemorrhoids. Early interval due March 2020 due to poor prep  . ear operations    . ESOPHAGOGASTRODUODENOSCOPY  02/02/2011  Rourk-Normal esophagus.  No varices/Question mild portal gastropathy, extrinsic compression on the antrum, lesser curvature of uncertain significance, some minimally  nodular mucosa status post biopsy, patent pylorus, normal duodenum 1 and duodenum 2.  . ESOPHAGOGASTRODUODENOSCOPY  12/15/11   Rourk-->mild changes of portal gastropathy, gastric/duodenal erosions s/p bx  (reactive changes with mild chronic inflammation. No H.pylori  . ESOPHAGOGASTRODUODENOSCOPY (EGD) WITH PROPOFOL N/A 11/07/2017   Dr. Gala Romney: normal esophagus s/p dilation, portal hypertensive gastropathy, erosive gastropathy, normal duodenum  . FOOT SURGERY     . MALONEY DILATION N/A 11/07/2017   Procedure: Venia Minks DILATION;  Surgeon: Daneil Dolin, MD;  Location: [facility];  Service: Endoscopy;  Laterality: N/A;  . ORIF ANKLE FRACTURE  08/27/2011   Procedure: OPEN REDUCTION INTERNAL FIXATION (ORIF) ANKLE FRACTURE;  Surgeon: [name], MD;  Location: [facility];  Service: Orthopedics;  Laterality: Right;  . TONSILLECTOMY      Current Outpatient Medications  Medication Sig Dispense Refill  . albuterol (PROVENTIL HFA;VENTOLIN HFA) 108 (90 Base) MCG/ACT inhaler Inhale 2 puffs into the lungs every 4 (four) hours as needed for wheezing or shortness of breath.    Marland Kitchen albuterol (PROVENTIL) (2.5 MG/3ML) 0.083% nebulizer solution Take 2.5 mg by nebulization every 6 (six) hours as needed for wheezing or shortness of breath.    Marland Kitchen aspirin EC 81 MG tablet Take 81 mg by mouth daily.    . cholecalciferol (VITAMIN D) 1000 units tablet Take 2,000 Units by mouth daily.     Marland Kitchen ezetimibe (ZETIA) 10 MG tablet Take 10 mg by mouth daily.    . furosemide (LASIX) 20 MG tablet Take 20 mg by mouth daily.    Marland Kitchen gabapentin (NEURONTIN) 300 MG capsule Take 300 mg by mouth 3 (three) times daily.    Marland Kitchen glimepiride (AMARYL) 2 MG tablet Take 2 mg by mouth daily with breakfast.    . lactulose (CHRONULAC) 10 GM/15ML solution Take 60 mLs (40 g total) by mouth 3 (three) times daily. Titrate for 3-4 bowel motions daily. 240 mL 2  . Magnesium 250 MG TABS Take 1 tablet by mouth daily.    . metoprolol tartrate (LOPRESSOR) 50 MG tablet Take 50 mg by mouth 3 (three) times daily.     . Multiple Vitamin (MULTIVITAMIN WITH MINERALS) TABS Take 1 tablet by mouth daily.    . Omega-3 Fatty Acids (FISH OIL) 1000 MG CAPS Take 1 capsule by mouth daily.    Marland Kitchen omeprazole (PRILOSEC) 20 MG capsule Take 20 mg by mouth daily.    Marland Kitchen oxyCODONE (OXY IR/ROXICODONE) 5 MG immediate release tablet Take 1 tablet by mouth 2 (two) times daily as needed.    . pantoprazole (PROTONIX) 40 MG tablet Take 1 tablet (40 mg  total) by mouth daily. Before breakfast 90 tablet 3  . potassium chloride SA (K-DUR,KLOR-CON) 20 MEQ tablet Take 20 mEq by mouth daily.     . QUEtiapine (SEROQUEL) 25 MG tablet Take 1 tablet (25 mg total) by mouth at bedtime. (Patient taking differently: Take 50 mg by mouth at bedtime. )    . rifaximin (XIFAXAN) 550 MG TABS tablet Take 1 tablet (550 mg total) by mouth 2 (two) times daily.    Marland Kitchen umeclidinium-vilanterol (ANORO ELLIPTA) 62.5-25 MCG/INH AEPB Inhale 1 puff into the lungs daily.    . vitamin B-12 (CYANOCOBALAMIN) 1000 MCG tablet Take 1,000 mcg by mouth daily.     No current facility-administered medications for this visit.     Allergies as of 12/12/2017 - Review Complete  12/12/2017  Allergen Reaction Noted  . Penicillins Rash 02/02/2011    Family History  Problem Relation Age of Onset  . Heart failure Mother   . Thyroid disease Mother   . Cancer Father   . Asthma Brother   . Heart attack Brother   . Cirrhosis Brother        EtOH  . Colon cancer Maternal Aunt     Social History   Socioeconomic History  . Marital status: Married    Spouse name: Not on file  . Number of children: 0  . Years of education: Not on file  . Highest education level: Not on file  Occupational History  . Occupation: disabled    Fish farm manager: UNEMPLOYED  Social Needs  . Financial resource strain: Not on file  . Food insecurity:    Worry: Not on file    Inability: Not on file  . Transportation needs:    Medical: Not on file    Non-medical: Not on file  Tobacco Use  . Smoking status: Current Every Day Smoker    Packs/day: 1.00    Years: 30.00    Pack years: 30.00    Types: Cigarettes  . Smokeless tobacco: Never Used  . Tobacco comment: only smokes about 1-2 a day  Substance and Sexual Activity  . Alcohol use: No  . Drug use: No  . Sexual activity: Not on file  Lifestyle  . Physical activity:    Days per week: Not on file    Minutes per session: Not on file  . Stress: Not on file    Relationships  . Social connections:    Talks on phone: Not on file    Gets together: Not on file    Attends religious service: Not on file    Active member of club or organization: Not on file    Attends meetings of clubs or organizations: Not on file    Relationship status: Not on file  Other Topics Concern  . Not on file  Social History Narrative  . Not on file    Review of Systems: Limited due to cognitive status.   Physical Exam: BP (!) 181/84   Pulse 66   Temp (!) 97.3 F (36.3 C) (Oral)   Ht 5\' 7"  (1.702 m)   Wt 222 lb 3.2 oz (100.8 kg)   BMI 34.80 kg/m  General:   Alert and oriented to person and place. Knows president but not the year. Able to keep eye contact.  Head:  Normocephalic and atraumatic. Eyes:  Conjuctiva clear without scleral icterus. Mouth:  Oral mucosa pink and moist. Tongue midline  Abdomen:  +BS, soft, non-tender and non-distended. Obese No rebound or guarding. No HSM or masses noted. Msk:  RLE with trace pre-tibial edema, deformed right ankle  Neurologic:  Alert and oriented to person and place. Bilateral equal grips. "twitching" of hands when grasping mine, unable to follow directions to assess for classic asterixis signs. Slower to respond, inappropriately laying on exam table the wrong way when asked to lay down.

## 2017-12-12 NOTE — Assessment & Plan Note (Signed)
57 year old male with likely NASH cirrhosis, presenting s/p EGD and colonoscopy for routine follow-up. Wife present with him today. This is only the second time I have seen him, but he was much more talkative and interactive during the first visit. Wife states he woke up confused, and she feels this is more related to his history of dementia as he is "repeating words". However, I suspect dealing with worsening encephalopathy. Doubt acute neurological event. Unclear baseline. She is declining any evaluation outside of the clinic today (such as ED). As he is able to converse with me although slightly confused, we discussed lactulose intake: it must be noted that he did NOT take any of his medications this morning, including lactulose. No obvious signs/symptoms of infection, no GI bleeding, and he has not been on oxycodone recently as they "ran out". We discussed limiting this completely if at all possible. I am concerned about his wife's capability of caring for him if he continues to decline with his multiple health issues. We will order labs today including CBC, CMP, INR, and ammonia (although this is transient and will not change care), get back on lactulose therapy, seek emergent attention if any changes in mental status. Needs US abdomen as well, as he is markedly overdue for hepatoma screening. Last imaging many years ago. I am also requesting Home Health Referral. Return in 4 weeks.

## 2017-12-12 NOTE — Patient Instructions (Signed)
Call 911 if he has further changes in mental status to include unable to rouse, non-verbal, sleepy, difficulty breathing.   Please take the lactulose right when getting home. Goal of 3 soft bowel movements a day. Call us to let us know how he is doing tomorrow.  I am ordering blood work and an ultrasound.  I am also requesting a Home Health Referral, as I think it would be good to have someone in there to check on him and you several times a week. They can be helpful in medication management, symptom management, etc.  I want to see you back in 4 weeks. Please seek emergent care right away if he has any worsening of conditions.   It was a pleasure to see you today. I strive to create trusting relationships with patients to provide genuine, compassionate, and quality care. I value your feedback. If you receive a survey regarding your visit,  I greatly appreciate you taking time to fill this out.   Annitta Needs, PhD, ANP-BC [facility]

## 2017-12-13 ENCOUNTER — Encounter: Payer: Self-pay | Admitting: Internal Medicine

## 2017-12-13 NOTE — Progress Notes (Signed)
CC'ED TO PCP 

## 2017-12-15 ENCOUNTER — Ambulatory Visit (HOSPITAL_COMMUNITY)
Admission: RE | Admit: 2017-12-15 | Discharge: 2017-12-15 | Disposition: A | Discharge status: Home / Self Care | Payer: Medicare HMO | Source: Ambulatory Visit | Attending: Gastroenterology | Admitting: Gastroenterology

## 2017-12-15 DIAGNOSIS — K8020 Calculus of gallbladder without cholecystitis without obstruction: Secondary | ICD-10-CM | POA: Insufficient documentation

## 2017-12-15 DIAGNOSIS — K7460 Unspecified cirrhosis of liver: Secondary | ICD-10-CM | POA: Insufficient documentation

## 2017-12-15 LAB — COMPLETE METABOLIC PANEL WITH GFR
AG Ratio: 1.1 (calc) (ref 1.0–2.5)
ALBUMIN MSPROF: 3.3 g/dL — AB (ref 3.6–5.1)
ALKALINE PHOSPHATASE (APISO): 136 U/L — AB (ref 40–115)
ALT: 18 U/L (ref 9–46)
AST: 44 U/L — ABNORMAL HIGH (ref 10–35)
BUN: 8 mg/dL (ref 7–25)
CALCIUM: 9.2 mg/dL (ref 8.6–10.3)
CO2: 28 mmol/L (ref 20–32)
CREATININE: 0.71 mg/dL (ref 0.70–1.33)
Chloride: 108 mmol/L (ref 98–110)
GFR, EST AFRICAN AMERICAN: 120 mL/min/{1.73_m2} (ref >=60)
GFR, EST NON AFRICAN AMERICAN: 103 mL/min/{1.73_m2} (ref >=60)
GLUCOSE: 90 mg/dL (ref 65–139)
Globulin: 3.1 g/dL (calc) (ref 1.9–3.7)
Potassium: 3.8 mmol/L (ref 3.5–5.3)
Sodium: 139 mmol/L (ref 135–146)
TOTAL PROTEIN: 6.4 g/dL (ref 6.1–8.1)
Total Bilirubin: 2.2 mg/dL — ABNORMAL HIGH (ref 0.2–1.2)

## 2017-12-15 LAB — CBC WITH DIFFERENTIAL/PLATELET
BASOS PCT: 1.7 %
Basophils Absolute: 71 cells/uL (ref 0–200)
Eosinophils Absolute: 416 cells/uL (ref 15–500)
Eosinophils Relative: 9.9 %
HCT: 45.2 % (ref 38.5–50.0)
HEMOGLOBIN: 16.3 g/dL (ref 13.2–17.1)
Lymphs Abs: 1600 cells/uL (ref 850–3900)
MCH: 34.7 pg — AB (ref 27.0–33.0)
MCHC: 36.1 g/dL — ABNORMAL HIGH (ref 32.0–36.0)
MCV: 96.2 fL (ref 80.0–100.0)
MONOS PCT: 14.2 %
MPV: 11 fL (ref 7.5–12.5)
NEUTROS ABS: 1516 {cells}/uL (ref 1500–7800)
Neutrophils Relative %: 36.1 %
Platelets: 83 10*3/uL — ABNORMAL LOW (ref 140–400)
RBC: 4.7 10*6/uL (ref 4.20–5.80)
RDW: 12.8 % (ref 11.0–15.0)
Total Lymphocyte: 38.1 %
WBC mixed population: 596 cells/uL (ref 200–950)
WBC: 4.2 10*3/uL (ref 3.8–10.8)

## 2017-12-15 LAB — PROTIME-INR
INR: 1.2 — AB
Prothrombin Time: 12.8 s — ABNORMAL HIGH (ref 9.0–11.5)

## 2017-12-16 LAB — HEPATITIS B CORE ANTIBODY, TOTAL: HEP B C TOTAL AB: NONREACTIVE

## 2017-12-16 LAB — AMMONIA: AMMONIA: 148 umol/L — AB (ref <=72)

## 2017-12-23 NOTE — Progress Notes (Signed)
Gallstones on [place], known cirrhosis. Portal vein patent. Labs reviewed: Child Pugh B, MELD Na 12. Ammonia was elevated as expected. We addressed signs/symptoms to monitor and when to seek medical attention at time of office visit. He has close follow-up with me at end of month. We have ordered home health as well; hopefully this can be helpful.

## 2017-12-23 NOTE — Progress Notes (Signed)
Addressed under US abdomen. Will be seeing patient end of May for close follow-up.

## 2017-12-26 ENCOUNTER — Telehealth: Payer: Self-pay

## 2017-12-26 DIAGNOSIS — I10 Essential (primary) hypertension: Secondary | ICD-10-CM | POA: Diagnosis not present

## 2017-12-26 DIAGNOSIS — K7581 Nonalcoholic steatohepatitis (NASH): Secondary | ICD-10-CM | POA: Diagnosis not present

## 2017-12-26 DIAGNOSIS — J449 Chronic obstructive pulmonary disease, unspecified: Secondary | ICD-10-CM | POA: Diagnosis not present

## 2017-12-26 DIAGNOSIS — R1310 Dysphagia, unspecified: Secondary | ICD-10-CM | POA: Diagnosis not present

## 2017-12-26 DIAGNOSIS — M549 Dorsalgia, unspecified: Secondary | ICD-10-CM | POA: Diagnosis not present

## 2017-12-26 DIAGNOSIS — E1142 Type 2 diabetes mellitus with diabetic polyneuropathy: Secondary | ICD-10-CM | POA: Diagnosis not present

## 2017-12-26 DIAGNOSIS — R69 Illness, unspecified: Secondary | ICD-10-CM | POA: Diagnosis not present

## 2017-12-26 DIAGNOSIS — G8929 Other chronic pain: Secondary | ICD-10-CM | POA: Diagnosis not present

## 2017-12-26 NOTE — Telephone Encounter (Signed)
Routing message to AB 

## 2017-12-26 NOTE — Telephone Encounter (Signed)
Noted  

## 2017-12-26 NOTE — Telephone Encounter (Signed)
FYI AB received a VM from [place] from [facility], they tried calling pt yesterday with no response, went by pts home this morning and pt wasn't home. Pts spouses sister let the nurse know that the pt will be gone all day today. The nurse will try to reach pt again this week and wanted to let you know what was going on.  Contact number for Malachy Mood is [phone]

## 2017-12-31 DIAGNOSIS — E1149 Type 2 diabetes mellitus with other diabetic neurological complication: Secondary | ICD-10-CM | POA: Diagnosis not present

## 2017-12-31 DIAGNOSIS — E1165 Type 2 diabetes mellitus with hyperglycemia: Secondary | ICD-10-CM | POA: Diagnosis not present

## 2017-12-31 DIAGNOSIS — S82309A Unspecified fracture of lower end of unspecified tibia, initial encounter for closed fracture: Secondary | ICD-10-CM | POA: Diagnosis not present

## 2017-12-31 DIAGNOSIS — M86671 Other chronic osteomyelitis, right ankle and foot: Secondary | ICD-10-CM | POA: Diagnosis not present

## 2017-12-31 DIAGNOSIS — R262 Difficulty in walking, not elsewhere classified: Secondary | ICD-10-CM | POA: Diagnosis not present

## 2017-12-31 DIAGNOSIS — I6350 Cerebral infarction due to unspecified occlusion or stenosis of unspecified cerebral artery: Secondary | ICD-10-CM | POA: Diagnosis not present

## 2017-12-31 DIAGNOSIS — J449 Chronic obstructive pulmonary disease, unspecified: Secondary | ICD-10-CM | POA: Diagnosis not present

## 2017-12-31 DIAGNOSIS — K7460 Unspecified cirrhosis of liver: Secondary | ICD-10-CM | POA: Diagnosis not present

## 2017-12-31 DIAGNOSIS — E11621 Type 2 diabetes mellitus with foot ulcer: Secondary | ICD-10-CM | POA: Diagnosis not present

## 2017-12-31 DIAGNOSIS — M6281 Muscle weakness (generalized): Secondary | ICD-10-CM | POA: Diagnosis not present

## 2018-01-17 ENCOUNTER — Ambulatory Visit (INDEPENDENT_AMBULATORY_CARE_PROVIDER_SITE_OTHER): Payer: Medicare HMO | Admitting: Gastroenterology

## 2018-01-17 ENCOUNTER — Encounter: Payer: Self-pay | Admitting: Gastroenterology

## 2018-01-17 VITALS — BP 134/76 | HR 70 | Temp 97.0°F | Ht 67.0 in | Wt 226.2 lb

## 2018-01-17 DIAGNOSIS — K7460 Unspecified cirrhosis of liver: Secondary | ICD-10-CM | POA: Diagnosis not present

## 2018-01-17 NOTE — Progress Notes (Signed)
Primary Care Physician:  Hermine Messick, MD  Primary GI: Dr. Gala Romney   Chief Complaint  Patient presents with  . Cirrhosis    f/u.     HPI:   [name] [name] is a 57 y.o. 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 Hep B surface antibody negative, Hep B surface antigen negative. Hep B core antibody non-reactive. Needs Hep A/B vaccinations.Ferritin not significantly elevated, and sats 42. Will follow serially. HIV negative in 2018. Hep C negative 2011. Completed EGD recently with normal esophagus s/p dilation, portal hypertensive gastropathy, erosive gastropathy, normal duodenum. Colonoscopy inadequate prep and due for early interval March 2020.    At last visit was dealing with intermittent encephalopathy and had been non-compliant with lactulose therapy. Updated US abdomen in April 2019. No HCC. Needs [place] again in Oct 2019. Requested Home Health Referral due to decline in health status. Child Pugh B, MELD NA 12.   Has about 8 loose stools a day. Doesn't have a measuring cup but has a small cup that comes with medications and wife giving him this. From her description, sounds to be approximately 60 ml. Vision is not doing as well lately. Xifaxan BID.    States he has RUQ and LUQ discomfort intermittently, usually associated with lactulose. Stays nauseated. Protonix helping. Feels sick after taking lactulose.   Past Medical History:  Diagnosis Date  . Anemia   . Anxiety   . Arthritis   . Bipolar 1 disorder ([place])   . Blind right eye   . Blood clots in brain    per patient  . Cardiomegaly   . Cataract    Right eye  . Cerebrovascular disease    Right vertebral artery  . Cholelithiasis    Asymptomatic  . Chronic back pain   . Cirrhosis ([place])    suspected NASH  . Cirrhosis ([place])    diagnosed 07/2010 when presented with first episode of hepatic encephalopathy, afp on 416/13=4.8,  U/S on  12/14/11  liver stable, pt has received 2 hep A/B vaccines  . COPD (chronic obstructive pulmonary disease) ([place])   . Dementia   . Depression   . Diabetes mellitus   . Diverticulosis   . Dyspnea   . GERD (gastroesophageal reflux disease)   . Hard of hearing   . Hepatic encephalopathy ([place])   . Hepatitis   . Hyperlipidemia   . Hypertension   . Mental retardation   . Seizures ([place])   . Thrombocytopenia ([place])     Past Surgical History:  Procedure Laterality Date  . CAST APPLICATION  11/27/9627   Procedure: MINOR CAST APPLICATION;  Surgeon: [name], MD;  Location: [facility] OR[facility];  Service: Orthopedics;  Laterality: Right;  no anesthesia , procedure room do not need or room !  . COLONOSCOPY  12/15/11   colonic divericulosis;poor prep, ACBE  recommended but has not been done  . COLONOSCOPY WITH PROPOFOL N/A 11/07/2017   Dr. Gala Romney: inadequate prep, internal hemorrhoids. Early interval due March 2020 due to poor prep  . ear operations    . ESOPHAGOGASTRODUODENOSCOPY  02/02/2011   Rourk-Normal esophagus.  No varices/Question mild portal gastropathy, extrinsic compression on the antrum, lesser curvature of uncertain significance, some minimally  nodular mucosa status post biopsy, patent pylorus, normal duodenum 1 and duodenum 2.  . ESOPHAGOGASTRODUODENOSCOPY  12/15/11   Rourk-->mild changes of portal gastropathy, gastric/duodenal erosions s/p bx  (reactive changes  with mild chronic inflammation. No H.pylori  . ESOPHAGOGASTRODUODENOSCOPY (EGD) WITH PROPOFOL N/A 11/07/2017   Dr. Gala Romney: normal esophagus s/p dilation, portal hypertensive gastropathy, erosive gastropathy, normal duodenum  . FOOT SURGERY    . MALONEY DILATION N/A 11/07/2017   Procedure: Venia Minks DILATION;  Surgeon: Daneil Dolin, MD;  Location: [facility] SU[facility];  Service: Endoscopy;  Laterality: N/A;  . ORIF ANKLE FRACTURE  08/27/2011   Procedure: OPEN REDUCTION INTERNAL FIXATION (ORIF) ANKLE FRACTURE;  Surgeon: [name], MD;   Location: [facility];  Service: Orthopedics;  Laterality: Right;  . TONSILLECTOMY      Current Outpatient Medications  Medication Sig Dispense Refill  . albuterol (PROVENTIL HFA;VENTOLIN HFA) 108 (90 Base) MCG/ACT inhaler Inhale 2 puffs into the lungs every 4 (four) hours as needed for wheezing or shortness of breath.    Marland Kitchen albuterol (PROVENTIL) (2.5 MG/3ML) 0.083% nebulizer solution Take 2.5 mg by nebulization every 6 (six) hours as needed for wheezing or shortness of breath.    Marland Kitchen aspirin EC 81 MG tablet Take 81 mg by mouth daily.    . cholecalciferol (VITAMIN D) 1000 units tablet Take 2,000 Units by mouth daily.     Marland Kitchen ezetimibe (ZETIA) 10 MG tablet Take 10 mg by mouth daily.    . furosemide (LASIX) 20 MG tablet Take 20 mg by mouth daily.    Marland Kitchen gabapentin (NEURONTIN) 300 MG capsule Take 300 mg by mouth 3 (three) times daily.    Marland Kitchen glimepiride (AMARYL) 2 MG tablet Take 2 mg by mouth daily with breakfast.    . lactulose (CHRONULAC) 10 GM/15ML solution Take 60 mLs (40 g total) by mouth 3 (three) times daily. Titrate for 3-4 bowel motions daily. 240 mL 2  . Magnesium 250 MG TABS Take 1 tablet by mouth daily.    . metoprolol tartrate (LOPRESSOR) 50 MG tablet Take 50 mg by mouth 3 (three) times daily.     . Multiple Vitamin (MULTIVITAMIN WITH MINERALS) TABS Take 1 tablet by mouth daily.    . Omega-3 Fatty Acids (FISH OIL) 1000 MG CAPS Take 1 capsule by mouth daily.    Marland Kitchen omeprazole (PRILOSEC) 20 MG capsule Take 20 mg by mouth daily.    Marland Kitchen oxyCODONE (OXY IR/ROXICODONE) 5 MG immediate release tablet Take 1 tablet by mouth 2 (two) times daily as needed.    . pantoprazole (PROTONIX) 40 MG tablet Take 1 tablet (40 mg total) by mouth daily. Before breakfast 90 tablet 3  . potassium chloride SA (K-DUR,KLOR-CON) 20 MEQ tablet Take 20 mEq by mouth daily.     . QUEtiapine (SEROQUEL) 25 MG tablet Take 1 tablet (25 mg total) by mouth at bedtime. (Patient taking differently: Take 50 mg by mouth at bedtime. )    .  rifaximin (XIFAXAN) 550 MG TABS tablet Take 1 tablet (550 mg total) by mouth 2 (two) times daily.    Marland Kitchen umeclidinium-vilanterol (ANORO ELLIPTA) 62.5-25 MCG/INH AEPB Inhale 1 puff into the lungs daily.    . vitamin B-12 (CYANOCOBALAMIN) 1000 MCG tablet Take 1,000 mcg by mouth daily.     No current facility-administered medications for this visit.     Allergies as of 01/17/2018 - Review Complete 01/17/2018  Allergen Reaction Noted  . Penicillins Rash 02/02/2011    Family History  Problem Relation Age of Onset  . Heart failure Mother   . Thyroid disease Mother   . Cancer Father   . Asthma Brother   . Heart attack Brother   . Cirrhosis Brother  EtOH  . Colon cancer Maternal Aunt     Social History   Socioeconomic History  . Marital status: Married    Spouse name: Not on file  . Number of children: 0  . Years of education: Not on file  . Highest education level: Not on file  Occupational History  . Occupation: disabled    Fish farm manager: UNEMPLOYED  Social Needs  . Financial resource strain: Not on file  . Food insecurity:    Worry: Not on file    Inability: Not on file  . Transportation needs:    Medical: Not on file    Non-medical: Not on file  Tobacco Use  . Smoking status: Current Every Day Smoker    Packs/day: 1.00    Years: 30.00    Pack years: 30.00    Types: Cigarettes  . Smokeless tobacco: Never Used  . Tobacco comment: only smokes about 1-2 a day  Substance and Sexual Activity  . Alcohol use: No  . Drug use: No  . Sexual activity: Not on file  Lifestyle  . Physical activity:    Days per week: Not on file    Minutes per session: Not on file  . Stress: Not on file  Relationships  . Social connections:    Talks on phone: Not on file    Gets together: Not on file    Attends religious service: Not on file    Active member of club or organization: Not on file    Attends meetings of clubs or organizations: Not on file    Relationship status: Not on file   Other Topics Concern  . Not on file  Social History Narrative  . Not on file    Review of Systems: Gen: Denies fever, chills, anorexia. Denies fatigue, weakness, weight loss.  CV: Denies chest pain, palpitations, syncope, peripheral edema, and claudication. Resp: Denies dyspnea at rest, cough, wheezing, coughing up blood, and pleurisy. GI: see HPI  Derm: Denies rash, itching, dry skin Psych: see HPI  Heme: Denies bruising, bleeding, and enlarged lymph nodes.  Physical Exam: BP 134/76   Pulse 70   Temp (!) 97 F (36.1 C) (Oral)   Ht 5\' 7"  (1.702 m)   Wt 226 lb 3.2 oz (102.6 kg)   BMI 35.43 kg/m  General:   Alert and oriented. No distress noted. Pleasant and cooperative.  Head:  Normocephalic and atraumatic. Eyes:  Conjuctiva clear without scleral icterus. Mouth:  Oral mucosa pink and moist.  Abdomen:  +BS, soft, non-tender and non-distended. No rebound or guarding. No HSM or masses noted. Msk:  Symmetrical without gross deformities. Normal posture. Extremities:  With RLE pre-tibial edema, deformed right ankle  Neurologic:  Alert and  oriented x4 Psych:  Alert and cooperative. Normal mood and affect.

## 2018-01-17 NOTE — Patient Instructions (Signed)
I have given a prescription to have the Hepatitis A and B vaccination completed.  For lactulose: it would be good to get a small medicine cup from any pharmacy or drug store. Your pharmacy may have some to spare or you may have to buy some. I don't have any here, unfortunately. If you aren't able to get that, then please take 1/2 the dose of lactulose three times a day. Our goal is 3 soft bowel movements a day. If you are able to get a medicine cup, take 30 milliliters 3 times a day.  We will see you in 3-4 months!  It was a pleasure to see you today. I strive to create trusting relationships with patients to provide genuine, compassionate, and quality care. I value your feedback. If you receive a survey regarding your visit,  I greatly appreciate you taking time to fill this out.   Annitta Needs, PhD, ANP-BC [facility]

## 2018-01-19 NOTE — Assessment & Plan Note (Signed)
NASH cirrhosis, intermittent encephalopathy but much improved today since last visit. Diarrhea on lactulose due to dosing schedule. Decrease by half so he is taking 30 ml TID. Goal of 3 soft BMs daily. Continue Xifaxan. Next US abdomen Oct 2019. Needs Hep A and B vaccinations. EGD up-to-date. Return in 3-4 months.

## 2018-01-20 NOTE — Progress Notes (Signed)
CC'D TO PCP °

## 2018-01-31 DIAGNOSIS — M86671 Other chronic osteomyelitis, right ankle and foot: Secondary | ICD-10-CM | POA: Diagnosis not present

## 2018-01-31 DIAGNOSIS — E1165 Type 2 diabetes mellitus with hyperglycemia: Secondary | ICD-10-CM | POA: Diagnosis not present

## 2018-01-31 DIAGNOSIS — S82309A Unspecified fracture of lower end of unspecified tibia, initial encounter for closed fracture: Secondary | ICD-10-CM | POA: Diagnosis not present

## 2018-01-31 DIAGNOSIS — E1149 Type 2 diabetes mellitus with other diabetic neurological complication: Secondary | ICD-10-CM | POA: Diagnosis not present

## 2018-01-31 DIAGNOSIS — R262 Difficulty in walking, not elsewhere classified: Secondary | ICD-10-CM | POA: Diagnosis not present

## 2018-01-31 DIAGNOSIS — M6281 Muscle weakness (generalized): Secondary | ICD-10-CM | POA: Diagnosis not present

## 2018-01-31 DIAGNOSIS — I6350 Cerebral infarction due to unspecified occlusion or stenosis of unspecified cerebral artery: Secondary | ICD-10-CM | POA: Diagnosis not present

## 2018-01-31 DIAGNOSIS — E11621 Type 2 diabetes mellitus with foot ulcer: Secondary | ICD-10-CM | POA: Diagnosis not present

## 2018-01-31 DIAGNOSIS — J449 Chronic obstructive pulmonary disease, unspecified: Secondary | ICD-10-CM | POA: Diagnosis not present

## 2018-01-31 DIAGNOSIS — K7460 Unspecified cirrhosis of liver: Secondary | ICD-10-CM | POA: Diagnosis not present

## 2018-02-01 ENCOUNTER — Telehealth: Payer: Self-pay

## 2018-02-01 NOTE — Telephone Encounter (Signed)
Tried calling pt, will try again.

## 2018-02-01 NOTE — Telephone Encounter (Signed)
Received duplication cahnge authrization form from [facility]. On pts medication list Omeprazole and Pantoprazole are listed. I tried calling pt to see if he is taking both prescriptions. [place] and pt last filled Omeprazole 20 mg on 01/23/18 and Pantoprazole 40 mg last filled date was 01/03/18. Please advise on if pt should continue both medications.

## 2018-02-01 NOTE — Telephone Encounter (Signed)
Should not be on both. He can stick with whichever one works best for him, but not both.

## 2018-02-01 NOTE — Telephone Encounter (Signed)
Spoke with pts souse and pt is only taking Omeprazole daily. Called [facility] and they updated their records.

## 2018-02-02 DIAGNOSIS — G894 Chronic pain syndrome: Secondary | ICD-10-CM | POA: Diagnosis not present

## 2018-02-02 DIAGNOSIS — F319 Bipolar disorder, unspecified: Secondary | ICD-10-CM | POA: Diagnosis not present

## 2018-02-02 DIAGNOSIS — R69 Illness, unspecified: Secondary | ICD-10-CM | POA: Diagnosis not present

## 2018-02-02 DIAGNOSIS — F5101 Primary insomnia: Secondary | ICD-10-CM | POA: Diagnosis not present

## 2018-02-02 DIAGNOSIS — Z79899 Other long term (current) drug therapy: Secondary | ICD-10-CM | POA: Diagnosis not present

## 2018-02-02 DIAGNOSIS — I10 Essential (primary) hypertension: Secondary | ICD-10-CM | POA: Diagnosis not present

## 2018-02-02 DIAGNOSIS — E1142 Type 2 diabetes mellitus with diabetic polyneuropathy: Secondary | ICD-10-CM | POA: Diagnosis not present

## 2018-02-17 ENCOUNTER — Emergency Department (HOSPITAL_COMMUNITY): Payer: Medicare HMO

## 2018-02-17 ENCOUNTER — Observation Stay (HOSPITAL_COMMUNITY)
Admission: EM | Admit: 2018-02-17 | Discharge: 2018-02-18 | Disposition: A | Discharge status: Home / Self Care | Payer: Medicare HMO | Attending: Internal Medicine | Admitting: Internal Medicine

## 2018-02-17 ENCOUNTER — Encounter (HOSPITAL_COMMUNITY): Payer: Self-pay

## 2018-02-17 ENCOUNTER — Observation Stay (HOSPITAL_COMMUNITY): Payer: Medicare HMO

## 2018-02-17 ENCOUNTER — Other Ambulatory Visit: Payer: Self-pay

## 2018-02-17 DIAGNOSIS — J449 Chronic obstructive pulmonary disease, unspecified: Secondary | ICD-10-CM | POA: Diagnosis not present

## 2018-02-17 DIAGNOSIS — F17210 Nicotine dependence, cigarettes, uncomplicated: Secondary | ICD-10-CM | POA: Insufficient documentation

## 2018-02-17 DIAGNOSIS — E119 Type 2 diabetes mellitus without complications: Secondary | ICD-10-CM | POA: Diagnosis not present

## 2018-02-17 DIAGNOSIS — G40909 Epilepsy, unspecified, not intractable, without status epilepticus: Secondary | ICD-10-CM

## 2018-02-17 DIAGNOSIS — R400 Somnolence: Secondary | ICD-10-CM | POA: Diagnosis not present

## 2018-02-17 DIAGNOSIS — Z7984 Long term (current) use of oral hypoglycemic drugs: Secondary | ICD-10-CM | POA: Insufficient documentation

## 2018-02-17 DIAGNOSIS — R062 Wheezing: Secondary | ICD-10-CM | POA: Diagnosis not present

## 2018-02-17 DIAGNOSIS — R69 Illness, unspecified: Secondary | ICD-10-CM | POA: Diagnosis not present

## 2018-02-17 DIAGNOSIS — I10 Essential (primary) hypertension: Secondary | ICD-10-CM | POA: Diagnosis not present

## 2018-02-17 DIAGNOSIS — R4182 Altered mental status, unspecified: Secondary | ICD-10-CM | POA: Diagnosis not present

## 2018-02-17 DIAGNOSIS — Z79899 Other long term (current) drug therapy: Secondary | ICD-10-CM | POA: Insufficient documentation

## 2018-02-17 DIAGNOSIS — R609 Edema, unspecified: Secondary | ICD-10-CM

## 2018-02-17 DIAGNOSIS — R600 Localized edema: Secondary | ICD-10-CM | POA: Diagnosis not present

## 2018-02-17 DIAGNOSIS — K7200 Acute and subacute hepatic failure without coma: Secondary | ICD-10-CM | POA: Diagnosis present

## 2018-02-17 DIAGNOSIS — R402441 Other coma, without documented Glasgow coma scale score, or with partial score reported, in the field [EMT or ambulance]: Secondary | ICD-10-CM | POA: Diagnosis not present

## 2018-02-17 DIAGNOSIS — L03115 Cellulitis of right lower limb: Secondary | ICD-10-CM | POA: Diagnosis not present

## 2018-02-17 DIAGNOSIS — L539 Erythematous condition, unspecified: Secondary | ICD-10-CM | POA: Diagnosis not present

## 2018-02-17 DIAGNOSIS — Z7982 Long term (current) use of aspirin: Secondary | ICD-10-CM | POA: Insufficient documentation

## 2018-02-17 DIAGNOSIS — K7682 Hepatic encephalopathy: Secondary | ICD-10-CM | POA: Diagnosis present

## 2018-02-17 DIAGNOSIS — R2242 Localized swelling, mass and lump, left lower limb: Secondary | ICD-10-CM | POA: Insufficient documentation

## 2018-02-17 DIAGNOSIS — K219 Gastro-esophageal reflux disease without esophagitis: Secondary | ICD-10-CM | POA: Diagnosis not present

## 2018-02-17 DIAGNOSIS — K7290 Hepatic failure, unspecified without coma: Secondary | ICD-10-CM

## 2018-02-17 DIAGNOSIS — E13638 Other specified diabetes mellitus with other oral complications: Secondary | ICD-10-CM

## 2018-02-17 DIAGNOSIS — K7460 Unspecified cirrhosis of liver: Secondary | ICD-10-CM

## 2018-02-17 DIAGNOSIS — G894 Chronic pain syndrome: Secondary | ICD-10-CM

## 2018-02-17 DIAGNOSIS — F0390 Unspecified dementia without behavioral disturbance: Secondary | ICD-10-CM | POA: Diagnosis not present

## 2018-02-17 HISTORY — DX: Patient's noncompliance with other medical treatment and regimen due to unspecified reason: Z91.199

## 2018-02-17 HISTORY — DX: Patient's noncompliance with other medical treatment and regimen: Z91.19

## 2018-02-17 LAB — URINALYSIS, ROUTINE W REFLEX MICROSCOPIC
Bacteria, UA: NONE SEEN
Bilirubin Urine: NEGATIVE
Glucose, UA: NEGATIVE mg/dL
Ketones, ur: NEGATIVE mg/dL
LEUKOCYTES UA: NEGATIVE
Nitrite: NEGATIVE
PH: 5 (ref 5.0–8.0)
Protein, ur: NEGATIVE mg/dL
SPECIFIC GRAVITY, URINE: 1.004 — AB (ref 1.005–1.030)

## 2018-02-17 LAB — CBC WITH DIFFERENTIAL/PLATELET
BASOS ABS: 0 10*3/uL (ref 0.0–0.1)
Basophils Relative: 1 %
EOS PCT: 8 %
Eosinophils Absolute: 0.3 10*3/uL (ref 0.0–0.7)
HCT: 40.4 % (ref 39.0–52.0)
Hemoglobin: 13.4 g/dL (ref 13.0–17.0)
LYMPHS PCT: 42 %
Lymphs Abs: 1.3 10*3/uL (ref 0.7–4.0)
MCH: 34.4 pg — ABNORMAL HIGH (ref 26.0–34.0)
MCHC: 33.2 g/dL (ref 30.0–36.0)
MCV: 103.6 fL — AB (ref 78.0–100.0)
MONO ABS: 0.4 10*3/uL (ref 0.1–1.0)
Monocytes Relative: 14 %
Neutro Abs: 1.1 10*3/uL — ABNORMAL LOW (ref 1.7–7.7)
Neutrophils Relative %: 35 %
PLATELETS: 108 10*3/uL — AB (ref 150–400)
RBC: 3.9 MIL/uL — ABNORMAL LOW (ref 4.22–5.81)
RDW: 14.2 % (ref 11.5–15.5)
WBC: 3.1 10*3/uL — ABNORMAL LOW (ref 4.0–10.5)

## 2018-02-17 LAB — LACTIC ACID, PLASMA: LACTIC ACID, VENOUS: 1.2 mmol/L (ref 0.5–1.9)

## 2018-02-17 LAB — COMPREHENSIVE METABOLIC PANEL
ALK PHOS: 102 U/L (ref 38–126)
ALT: 26 U/L (ref 0–44)
AST: 62 U/L — ABNORMAL HIGH (ref 15–41)
Albumin: 2.8 g/dL — ABNORMAL LOW (ref 3.5–5.0)
Anion gap: 6 (ref 5–15)
BUN: 6 mg/dL (ref 6–20)
CALCIUM: 8.7 mg/dL — AB (ref 8.9–10.3)
CO2: 31 mmol/L (ref 22–32)
CREATININE: 0.86 mg/dL (ref 0.61–1.24)
Chloride: 106 mmol/L (ref 98–111)
GFR calc non Af Amer: >60 mL/min (ref >60)
Glucose, Bld: 119 mg/dL — ABNORMAL HIGH (ref 70–99)
Potassium: 3.8 mmol/L (ref 3.5–5.1)
SODIUM: 143 mmol/L (ref 135–145)
Total Bilirubin: 3.3 mg/dL — ABNORMAL HIGH (ref 0.3–1.2)
Total Protein: 6.3 g/dL — ABNORMAL LOW (ref 6.5–8.1)

## 2018-02-17 LAB — RAPID URINE DRUG SCREEN, HOSP PERFORMED
AMPHETAMINES: NEGATIVE — AB
Benzodiazepines: NEGATIVE — AB
Cocaine: NEGATIVE — AB
Opiates: NEGATIVE — AB
TETRAHYDROCANNABINOL: NEGATIVE — AB

## 2018-02-17 LAB — GLUCOSE, CAPILLARY: Glucose-Capillary: 106 mg/dL — ABNORMAL HIGH (ref 70–99)

## 2018-02-17 LAB — BRAIN NATRIURETIC PEPTIDE: B NATRIURETIC PEPTIDE 5: 56 pg/mL (ref 0.0–100.0)

## 2018-02-17 LAB — PROTIME-INR
INR: 1.41
Prothrombin Time: 17.2 seconds — ABNORMAL HIGH (ref 11.4–15.2)

## 2018-02-17 LAB — CBG MONITORING, ED: GLUCOSE-CAPILLARY: 124 mg/dL — AB (ref 70–99)

## 2018-02-17 LAB — TROPONIN I: Troponin I: <0.03 ng/mL (ref <0.03)

## 2018-02-17 LAB — AMMONIA: Ammonia: 81 umol/L — ABNORMAL HIGH (ref 9–35)

## 2018-02-17 LAB — ETHANOL: Alcohol, Ethyl (B): <10 mg/dL (ref <10)

## 2018-02-17 MED ORDER — INSULIN ASPART 100 UNIT/ML ~~LOC~~ SOLN: 0.0000 [IU] | Freq: Every day | SUBCUTANEOUS | Status: DC

## 2018-02-17 MED ORDER — LACTULOSE 10 GM/15ML PO SOLN: 30.0000 g | Freq: Three times a day (TID) | ORAL | Status: DC

## 2018-02-17 MED ORDER — POTASSIUM CHLORIDE CRYS ER 20 MEQ PO TBCR
20.0000 meq | EXTENDED_RELEASE_TABLET | Freq: Every day | ORAL | Status: DC
  Administered 2018-02-17 – 2018-02-18 (×2): 20 meq via ORAL
  Filled 2018-02-17: qty 1
  Filled 2018-02-17: qty 2

## 2018-02-17 MED ORDER — MAGNESIUM OXIDE 400 (241.3 MG) MG PO TABS
200.0000 mg | ORAL_TABLET | Freq: Every day | ORAL | Status: DC
  Administered 2018-02-17 – 2018-02-18 (×2): 200 mg via ORAL
  Filled 2018-02-17 (×2): qty 1

## 2018-02-17 MED ORDER — PANTOPRAZOLE SODIUM 40 MG PO TBEC
40.0000 mg | DELAYED_RELEASE_TABLET | Freq: Every day | ORAL | Status: DC
  Administered 2018-02-17 – 2018-02-18 (×2): 40 mg via ORAL
  Filled 2018-02-17 (×2): qty 1

## 2018-02-17 MED ORDER — OXYCODONE HCL 5 MG PO TABS: 5.0000 mg | ORAL_TABLET | Freq: Two times a day (BID) | ORAL | Status: DC | PRN

## 2018-02-17 MED ORDER — CLINDAMYCIN PHOSPHATE 600 MG/50ML IV SOLN
600.0000 mg | Freq: Once | INTRAVENOUS | Status: AC
  Administered 2018-02-17: 600 mg via INTRAVENOUS
  Filled 2018-02-17: qty 50

## 2018-02-17 MED ORDER — ACETAMINOPHEN 650 MG RE SUPP: 650.0000 mg | Freq: Four times a day (QID) | RECTAL | Status: DC | PRN

## 2018-02-17 MED ORDER — METOPROLOL TARTRATE 50 MG PO TABS
50.0000 mg | ORAL_TABLET | Freq: Three times a day (TID) | ORAL | Status: DC
  Administered 2018-02-17 – 2018-02-18 (×3): 50 mg via ORAL
  Filled 2018-02-17 (×3): qty 1

## 2018-02-17 MED ORDER — UMECLIDINIUM-VILANTEROL 62.5-25 MCG/INH IN AEPB
1.0000 | INHALATION_SPRAY | Freq: Every day | RESPIRATORY_TRACT | Status: DC
  Administered 2018-02-18: 1 via RESPIRATORY_TRACT
  Filled 2018-02-17: qty 14

## 2018-02-17 MED ORDER — LACTULOSE 10 GM/15ML PO SOLN
60.0000 g | Freq: Three times a day (TID) | ORAL | Status: DC
  Administered 2018-02-17 – 2018-02-18 (×3): 60 g via ORAL
  Filled 2018-02-17 (×3): qty 90

## 2018-02-17 MED ORDER — RIFAXIMIN 550 MG PO TABS
550.0000 mg | ORAL_TABLET | Freq: Two times a day (BID) | ORAL | Status: DC
  Administered 2018-02-17 – 2018-02-18 (×2): 550 mg via ORAL
  Filled 2018-02-17 (×2): qty 1

## 2018-02-17 MED ORDER — ALBUTEROL SULFATE (2.5 MG/3ML) 0.083% IN NEBU
2.5000 mg | INHALATION_SOLUTION | Freq: Once | RESPIRATORY_TRACT | Status: AC
  Administered 2018-02-17: 2.5 mg via RESPIRATORY_TRACT
  Filled 2018-02-17: qty 3

## 2018-02-17 MED ORDER — ONDANSETRON HCL 4 MG/2ML IJ SOLN: 4.0000 mg | Freq: Four times a day (QID) | INTRAMUSCULAR | Status: DC | PRN

## 2018-02-17 MED ORDER — ENOXAPARIN SODIUM 40 MG/0.4ML ~~LOC~~ SOLN
40.0000 mg | SUBCUTANEOUS | Status: DC
Start: 2018-02-17 — End: 2018-02-18
  Administered 2018-02-17: 40 mg via SUBCUTANEOUS
  Filled 2018-02-17: qty 0.4

## 2018-02-17 MED ORDER — GLIMEPIRIDE 2 MG PO TABS
2.0000 mg | ORAL_TABLET | Freq: Every day | ORAL | Status: DC
  Administered 2018-02-18: 2 mg via ORAL
  Filled 2018-02-17: qty 1

## 2018-02-17 MED ORDER — CLINDAMYCIN PHOSPHATE 600 MG/50ML IV SOLN
600.0000 mg | Freq: Three times a day (TID) | INTRAVENOUS | Status: DC
  Administered 2018-02-17 – 2018-02-18 (×3): 600 mg via INTRAVENOUS
  Filled 2018-02-17 (×3): qty 50

## 2018-02-17 MED ORDER — SODIUM CHLORIDE 0.9 % IV BOLUS
250.0000 mL | Freq: Once | INTRAVENOUS | Status: AC
  Administered 2018-02-17: 250 mL via INTRAVENOUS

## 2018-02-17 MED ORDER — EZETIMIBE 10 MG PO TABS
10.0000 mg | ORAL_TABLET | Freq: Every day | ORAL | Status: DC
  Administered 2018-02-17 – 2018-02-18 (×2): 10 mg via ORAL
  Filled 2018-02-17 (×2): qty 1

## 2018-02-17 MED ORDER — SODIUM CHLORIDE 0.9 % IV SOLN
INTRAVENOUS | Status: DC
  Administered 2018-02-17 – 2018-02-18 (×3): via INTRAVENOUS

## 2018-02-17 MED ORDER — QUETIAPINE FUMARATE 25 MG PO TABS
50.0000 mg | ORAL_TABLET | Freq: Every day | ORAL | Status: DC
  Administered 2018-02-17: 50 mg via ORAL
  Filled 2018-02-17: qty 2

## 2018-02-17 MED ORDER — NALOXONE HCL 0.4 MG/ML IJ SOLN
0.4000 mg | Freq: Once | INTRAMUSCULAR | Status: AC
  Administered 2018-02-17: 0.4 mg via INTRAVENOUS
  Filled 2018-02-17: qty 1

## 2018-02-17 MED ORDER — ACETAMINOPHEN 325 MG PO TABS: 650.0000 mg | ORAL_TABLET | Freq: Four times a day (QID) | ORAL | Status: DC | PRN

## 2018-02-17 MED ORDER — FUROSEMIDE 20 MG PO TABS
20.0000 mg | ORAL_TABLET | Freq: Every day | ORAL | Status: DC
  Administered 2018-02-17 – 2018-02-18 (×2): 20 mg via ORAL
  Filled 2018-02-17 (×2): qty 1

## 2018-02-17 MED ORDER — ALBUTEROL SULFATE (2.5 MG/3ML) 0.083% IN NEBU: 3.0000 mL | INHALATION_SOLUTION | RESPIRATORY_TRACT | Status: DC | PRN

## 2018-02-17 MED ORDER — IPRATROPIUM-ALBUTEROL 0.5-2.5 (3) MG/3ML IN SOLN
3.0000 mL | Freq: Once | RESPIRATORY_TRACT | Status: AC
  Administered 2018-02-17: 3 mL via RESPIRATORY_TRACT
  Filled 2018-02-17: qty 3

## 2018-02-17 MED ORDER — ONDANSETRON HCL 4 MG PO TABS: 4.0000 mg | ORAL_TABLET | Freq: Four times a day (QID) | ORAL | Status: DC | PRN

## 2018-02-17 MED ORDER — ASPIRIN EC 81 MG PO TBEC
81.0000 mg | DELAYED_RELEASE_TABLET | Freq: Every day | ORAL | Status: DC
  Administered 2018-02-17 – 2018-02-18 (×2): 81 mg via ORAL
  Filled 2018-02-17 (×2): qty 1

## 2018-02-17 MED ORDER — INSULIN ASPART 100 UNIT/ML ~~LOC~~ SOLN
0.0000 [IU] | Freq: Three times a day (TID) | SUBCUTANEOUS | Status: DC
  Administered 2018-02-18: 3 [IU] via SUBCUTANEOUS
  Administered 2018-02-18: 2 [IU] via SUBCUTANEOUS

## 2018-02-17 MED ORDER — GABAPENTIN 300 MG PO CAPS
300.0000 mg | ORAL_CAPSULE | Freq: Three times a day (TID) | ORAL | Status: DC
  Administered 2018-02-17 – 2018-02-18 (×3): 300 mg via ORAL
  Filled 2018-02-17 (×3): qty 1

## 2018-02-17 NOTE — ED Notes (Signed)
Report given to [facility] on 300.

## 2018-02-17 NOTE — ED Notes (Signed)
Patient transported to CT 

## 2018-02-17 NOTE — ED Notes (Signed)
RT notified for neb tx. 

## 2018-02-17 NOTE — ED Notes (Signed)
Pt returned from CT °

## 2018-02-17 NOTE — ED Provider Notes (Signed)
[facility] EMERGENCY DEPARTMENT Provider Note   CSN: [id number] Arrival date & time: 02/17/18  1303     History   Chief Complaint Chief Complaint  Patient presents with  . Altered Mental Status    HPI [name] [name] is a 57 y.o. male.  The history is provided by the EMS personnel and a caregiver. The history is limited by the condition of the patient (Hx MR, AMS).  Altered Mental Status     Pt was seen at 1320. Per EMS and family report:  Pt's wife called EMS for pt being lethargic for the past several days. Family concerned pt's "ammonia level is up." Family also states pt's chronic RLE erythema is "worse" over the past several days. No reported fevers, injury, vomiting/diarrhea, abd pain, CP, cough.   Past Medical History:  Diagnosis Date  . Anemia   . Anxiety   . Arthritis   . Bipolar 1 disorder ([place])   . Blind right eye   . Blood clots in brain    per patient  . Cardiomegaly   . Cataract    Right eye  . Cerebrovascular disease    Right vertebral artery  . Cholelithiasis    Asymptomatic  . Chronic back pain   . Cirrhosis ([place])    suspected NASH  . Cirrhosis ([place])    diagnosed 07/2010 when presented with first episode of hepatic encephalopathy, afp on 416/13=4.8,  U/S on 12/14/11  liver stable, pt has received 2 hep A/B vaccines  . COPD (chronic obstructive pulmonary disease) ([place])   . Dementia   . Depression   . Diabetes mellitus   . Diverticulosis   . Dyspnea   . GERD (gastroesophageal reflux disease)   . Hard of hearing   . Hepatic encephalopathy ([place])   . Hepatitis   . Hyperlipidemia   . Hypertension   . Mental retardation   . Noncompliance   . Seizures ([place])   . Thrombocytopenia [facility])     Patient Active Problem List   Diagnosis Date Noted  . Dysphagia 10/10/2017  . Encounter for screening colonoscopy 10/10/2017  . Palliative care encounter   . Goals of care, counseling/discussion   . DNR (do not resuscitate) discussion   . Encounter for  hospice care discussion   . Altered mental status 09/11/2017  . Acute encephalopathy 12/29/2016  . Subarachnoid hemorrhage ([place]) 12/29/2016  . Bacteremia 12/29/2016  . Fall 12/21/2016  . Cerebral contusion ([place]) 12/21/2016  . Pressure injury of skin 12/14/2016  . Anxiety   . Suicidal ideation   . Acute hepatic encephalopathy ([place]) 08/02/2013  . Hypokalemia 02/01/2013  . Hyperammonemia ([place]) 01/31/2013  . Urinary incontinence 01/31/2013  . Pancytopenia ([place]) 09/17/2012  . Edema of right lower extremity 04/28/2012  . Hepatic encephalopathy ([place]) 04/28/2012  . Arthritis of ankle or foot, degenerative 04/26/2012  . Hematemesis 12/07/2011  . Thrombocytopenia ([place]) 12/07/2011  . Anemia 12/07/2011  . COPD (chronic obstructive pulmonary disease) ([place]) 08/25/2011  . DM (diabetes mellitus) ([place]) 08/25/2011  . HTN (hypertension) 08/25/2011  . Coagulopathy ([place]) 08/25/2011  . Cirrhosis ([place]) 08/25/2011  . Depression 08/25/2011  . Seizure disorder ([employer]) 08/25/2011  . GERD (gastroesophageal reflux disease) 08/25/2011  . Tobacco abuse 08/25/2011  . Ankle fracture, bimalleolar, closed 08/04/2011    Past Surgical History:  Procedure Laterality Date  . CAST APPLICATION  09/27/5807   Procedure: MINOR CAST APPLICATION;  Surgeon: [name], MD;  Location: [facility] ORS;  Service: Orthopedics;  Laterality: Right;  no anesthesia ,  procedure room do not need or room !  . COLONOSCOPY  12/15/11   colonic divericulosis;poor prep, ACBE  recommended but has not been done  . COLONOSCOPY WITH PROPOFOL N/A 11/07/2017   Dr. Gala Romney: inadequate prep, internal hemorrhoids. Early interval due March 2020 due to poor prep  . ear operations    . ESOPHAGOGASTRODUODENOSCOPY  02/02/2011   Rourk-Normal esophagus.  No varices/Question mild portal gastropathy, extrinsic compression on the antrum, lesser curvature of uncertain significance, some minimally  nodular mucosa status post biopsy, patent pylorus, normal duodenum 1  and duodenum 2.  . ESOPHAGOGASTRODUODENOSCOPY  12/15/11   Rourk-->mild changes of portal gastropathy, gastric/duodenal erosions s/p bx  (reactive changes with mild chronic inflammation. No H.pylori  . ESOPHAGOGASTRODUODENOSCOPY (EGD) WITH PROPOFOL N/A 11/07/2017   Dr. Gala Romney: normal esophagus s/p dilation, portal hypertensive gastropathy, erosive gastropathy, normal duodenum  . FOOT SURGERY    . MALONEY DILATION N/A 11/07/2017   Procedure: Venia Minks DILATION;  Surgeon: Daneil Dolin, MD;  Location: [facility] ENDO SUITE;  Service: Endoscopy;  Laterality: N/A;  . ORIF ANKLE FRACTURE  08/27/2011   Procedure: OPEN REDUCTION INTERNAL FIXATION (ORIF) ANKLE FRACTURE;  Surgeon: [name], MD;  Location: [facility] ORS;  Service: Orthopedics;  Laterality: Right;  . TONSILLECTOMY          Home Medications    Prior to Admission medications   Medication Sig Start Date End Date Taking? Authorizing Provider  albuterol (PROVENTIL HFA;VENTOLIN HFA) 108 (90 Base) MCG/ACT inhaler Inhale 2 puffs into the lungs every 4 (four) hours as needed for wheezing or shortness of breath.    [provider]  albuterol (PROVENTIL) (2.5 MG/3ML) 0.083% nebulizer solution Take 2.5 mg by nebulization every 6 (six) hours as needed for wheezing or shortness of breath.    [provider]  aspirin EC 81 MG tablet Take 81 mg by mouth daily.    [provider]  cholecalciferol (VITAMIN D) 1000 units tablet Take 2,000 Units by mouth daily.     [provider]  ezetimibe (ZETIA) 10 MG tablet Take 10 mg by mouth daily.    [provider]  furosemide (LASIX) 20 MG tablet Take 20 mg by mouth daily.    [provider]  gabapentin (NEURONTIN) 300 MG capsule Take 300 mg by mouth 3 (three) times daily.    [provider]  glimepiride (AMARYL) 2 MG tablet Take 2 mg by mouth daily with breakfast.    [provider]  lactulose (CHRONULAC) 10 GM/15ML solution Take 60 mLs (40 g total)  by mouth 3 (three) times daily. Titrate for 3-4 bowel motions daily. 12/15/16   Elgergawy, Silver Huguenin, MD  Magnesium 250 MG TABS Take 1 tablet by mouth daily.    [provider]  metoprolol tartrate (LOPRESSOR) 50 MG tablet Take 50 mg by mouth 3 (three) times daily.     [provider]  Multiple Vitamin (MULTIVITAMIN WITH MINERALS) TABS Take 1 tablet by mouth daily.    [provider]  Omega-3 Fatty Acids (FISH OIL) 1000 MG CAPS Take 1 capsule by mouth daily.    [provider]  omeprazole (PRILOSEC) 20 MG capsule Take 20 mg by mouth daily.    [provider]  oxyCODONE (OXY IR/ROXICODONE) 5 MG immediate release tablet Take 1 tablet by mouth 2 (two) times daily as needed. 10/04/17   [provider]  pantoprazole (PROTONIX) 40 MG tablet Take 1 tablet (40 mg total) by mouth daily. Before breakfast 10/10/17  Annitta Needs, NP  potassium chloride SA (K-DUR,KLOR-CON) 20 MEQ tablet Take 20 mEq by mouth daily.     [provider]  QUEtiapine (SEROQUEL) 25 MG tablet Take 1 tablet (25 mg total) by mouth at bedtime. Patient taking differently: Take 50 mg by mouth at bedtime.  12/31/16   Samuella Cota, MD  rifaximin (XIFAXAN) 550 MG TABS tablet Take 1 tablet (550 mg total) by mouth 2 (two) times daily. 12/31/16   Samuella Cota, MD  umeclidinium-vilanterol (ANORO ELLIPTA) 62.5-25 MCG/INH AEPB Inhale 1 puff into the lungs daily. 10/06/17   [provider]  vitamin B-12 (CYANOCOBALAMIN) 1000 MCG tablet Take 1,000 mcg by mouth daily.    [provider]    Family History Family History  Problem Relation Age of Onset  . Heart failure Mother   . Thyroid disease Mother   . Cancer Father   . Asthma Brother   . Heart attack Brother   . Cirrhosis Brother        EtOH  . Colon cancer Maternal Aunt     Social History Social History   Tobacco Use  . Smoking status: Current Every Day Smoker    Packs/day: 1.00    Years: 30.00      Pack years: 30.00    Types: Cigarettes  . Smokeless tobacco: Never Used  . Tobacco comment: only smokes about 1-2 a day  Substance Use Topics  . Alcohol use: No  . Drug use: No     Allergies   Penicillins   Review of Systems Review of Systems  Unable to perform ROS: Mental status change     Physical Exam Updated Vital Signs BP 105/66   Pulse 62   Temp 98.7 F (37.1 C) (Rectal)   Resp 14   Wt 102.5 kg (226 lb)   SpO2 100%   BMI 35.40 kg/m    Patient Vitals for the past 24 hrs:  BP Temp Temp src Pulse Resp SpO2 Weight  02/17/18 1500 102/64 - - 60 20 96 % -  02/17/18 1404 - - - - - 99 % -  02/17/18 1400 97/75 - - 65 (!) 27 96 % -  02/17/18 1332 - - - - - 90 % -  02/17/18 1330 105/66 - - 62 14 100 % -  02/17/18 1310 117/70 98.7 F (37.1 C) Rectal 63 10 94 % -  02/17/18 1308 - - - - - - 102.5 kg (226 lb)     Physical Exam 1325: Physical examination:  Nursing notes reviewed; Vital signs and O2 SAT reviewed;  Constitutional: Well developed, Well nourished, In no acute distress; Head:  Normocephalic, atraumatic; Eyes: EOMI, PERRL, No scleral icterus; ENMT: Mouth and pharynx normal, Mucous membranes dry; Neck: Supple, Full range of motion, No lymphadenopathy; Cardiovascular: Regular rate and rhythm, No gallop; Respiratory: Breath sounds diminished & equal bilaterally, exp wheezes bilat. No audible wheezing. Normal respiratory effort/excursion; Chest: Nontender, Movement normal; Abdomen: Soft, Nontender, Nondistended, Normal bowel sounds; Genitourinary: No CVA tenderness; Extremities: Peripheral pulses normal, +chronic right ankle deformity. +1 bilat LE's edema. No calf asymmetry. +erythema and induration to right lower leg ankle to knee. No open wounds.; Neuro: Lethargic, opens eyes to name, speaks with garbled words and falls back asleep. No facial droop. Follows simple commands. Moves all extremities on stretcher spontaneously and to command without apparent gross focal  motor deficits.; Skin: Color pale, Warm, Dry.   ED Treatments / Results  Labs (all labs ordered are listed,  but only abnormal results are displayed)   EKG EKG Interpretation  Date/Time:  Friday February 17 2018 13:08:33 EDT Ventricular Rate:  62 PR Interval:    QRS Duration: 90 QT Interval:  442 QTC Calculation: 449 R Axis:   25 Text Interpretation:  Sinus rhythm When compared with ECG of 12/21/2016 No significant change was found Confirmed by Francine Graven [phone]) on 02/17/2018 1:38:27 PM   Radiology   Procedures Procedures (including critical care time)  Medications Ordered in ED Medications  ipratropium-albuterol (DUONEB) 0.5-2.5 (3) MG/3ML nebulizer solution 3 mL (has no administration in time range)  albuterol (PROVENTIL) (2.5 MG/3ML) 0.083% nebulizer solution 2.5 mg (has no administration in time range)  naloxone [facility]) injection 0.4 mg (0.4 mg Intravenous Given 02/17/18 1334)     Initial Impression / Assessment and Plan / ED Course  I have reviewed the triage vital signs and the nursing notes.  Pertinent labs & imaging results that were available during my care of the patient were reviewed by me and considered in my medical decision making (see chart for details).  MDM Reviewed: previous chart, nursing note and vitals Reviewed previous: labs and ECG Interpretation: labs, ECG, x-ray and CT scan    Results for orders placed or performed during the hospital encounter of 02/17/18  Urinalysis, Routine w reflex microscopic  Result Value Ref Range   Color, Urine YELLOW YELLOW   APPearance CLEAR CLEAR   Specific Gravity, Urine 1.004 (L) 1.005 - 1.030   pH 5.0 5.0 - 8.0   Glucose, UA NEGATIVE NEGATIVE mg/dL   Hgb urine dipstick SMALL (A) NEGATIVE   Bilirubin Urine NEGATIVE NEGATIVE   Ketones, ur NEGATIVE NEGATIVE mg/dL   Protein, ur NEGATIVE NEGATIVE mg/dL   Nitrite NEGATIVE NEGATIVE   Leukocytes, UA NEGATIVE NEGATIVE   RBC / HPF 0-5 0 - 5 RBC/hpf   WBC, UA  0-5 0 - 5 WBC/hpf   Bacteria, UA NONE SEEN NONE SEEN   Squamous Epithelial / LPF 0-5 0 - 5   Mucus PRESENT    Hyaline Casts, UA PRESENT   Comprehensive metabolic panel  Result Value Ref Range   Sodium 143 135 - 145 mmol/L   Potassium 3.8 3.5 - 5.1 mmol/L   Chloride 106 98 - 111 mmol/L   CO2 31 22 - 32 mmol/L   Glucose, Bld 119 (H) 70 - 99 mg/dL   BUN 6 6 - 20 mg/dL   Creatinine, Ser 0.86 0.61 - 1.24 mg/dL   Calcium 8.7 (L) 8.9 - 10.3 mg/dL   Total Protein 6.3 (L) 6.5 - 8.1 g/dL   Albumin 2.8 (L) 3.5 - 5.0 g/dL   AST 62 (H) 15 - 41 U/L   ALT 26 0 - 44 U/L   Alkaline Phosphatase 102 38 - 126 U/L   Total Bilirubin 3.3 (H) 0.3 - 1.2 mg/dL   GFR calc non Af Amer >60 >60 mL/min   GFR calc Af Amer >60 >60 mL/min   Anion gap 6 5 - 15  Troponin I  Result Value Ref Range   Troponin I <0.03 <0.03 ng/mL  Lactic acid, plasma  Result Value Ref Range   Lactic Acid, Venous 1.2 0.5 - 1.9 mmol/L  CBC with Differential  Result Value Ref Range   WBC 3.1 (L) 4.0 - 10.5 K/uL   RBC 3.90 (L) 4.22 - 5.81 MIL/uL   Hemoglobin 13.4 13.0 - 17.0 g/dL   HCT 40.4 39.0 - 52.0 %   MCV 103.6 (H) 78.0 - 100.0  fL   MCH 34.4 (H) 26.0 - 34.0 pg   MCHC 33.2 30.0 - 36.0 g/dL   RDW 14.2 11.5 - 15.5 %   Platelets 108 (L) 150 - 400 K/uL   Neutrophils Relative % 35 %   Neutro Abs 1.1 (L) 1.7 - 7.7 K/uL   Lymphocytes Relative 42 %   Lymphs Abs 1.3 0.7 - 4.0 K/uL   Monocytes Relative 14 %   Monocytes Absolute 0.4 0.1 - 1.0 K/uL   Eosinophils Relative 8 %   Eosinophils Absolute 0.3 0.0 - 0.7 K/uL   Basophils Relative 1 %   Basophils Absolute 0.0 0.0 - 0.1 K/uL  Protime-INR  Result Value Ref Range   Prothrombin Time 17.2 (H) 11.4 - 15.2 seconds   INR 1.41   Ammonia  Result Value Ref Range   Ammonia 81 (H) 9 - 35 umol/L  Urine rapid drug screen (hosp performed)  Result Value Ref Range   Opiates NEGATIVE (A) NONE DETECTED   Cocaine NEGATIVE (A) NONE DETECTED   Benzodiazepines NEGATIVE (A) NONE DETECTED    Amphetamines NEGATIVE (A) NONE DETECTED   Tetrahydrocannabinol NEGATIVE (A) NONE DETECTED   Barbiturates (A) NONE DETECTED    Result not available. Reagent lot number recalled by manufacturer.  Ethanol  Result Value Ref Range   Alcohol, Ethyl (B) <10 <10 mg/dL  CBG monitoring, ED  Result Value Ref Range   Glucose-Capillary 124 (H) 70 - 99 mg/dL   Ct Head Wo Contrast Result Date: 02/17/2018 CLINICAL DATA:  Altered mental status EXAM: CT HEAD WITHOUT CONTRAST TECHNIQUE: Contiguous axial images were obtained from the base of the skull through the vertex without intravenous contrast. COMPARISON:  09/11/2017 FINDINGS: Brain: Mild atrophic changes are noted. No findings to suggest acute hemorrhage, acute infarction or space-occupying mass lesion are seen. Vascular: No hyperdense vessel or unexpected calcification. Skull: Normal. Negative for fracture or focal lesion. 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 Other: None IMPRESSION: Chronic atrophic changes without acute abnormality. Chronic changes in the right orbit. Electronically Signed   By: Inez Catalina M.D.   On: 02/17/2018 14:00   Dg Chest Portable 1 View Result Date: 02/17/2018 CLINICAL DATA:  57 year old with wheezing and altered mental status. EXAM: PORTABLE CHEST 1 VIEW COMPARISON:  09/11/2017 FINDINGS: Prominent central vascular structures and lung markings are chronic. Heart size is mildly enlarged and stable. Trachea is midline. No acute bone abnormality. IMPRESSION: No acute cardiopulmonary disease. Chronic prominent lung markings. Findings could be associated with chronic pulmonary vascular congestion. Electronically Signed   By: Markus Daft M.D.   On: 02/17/2018 14:19    Results for BACH, ROCCHI (MRN [id number]) as of 02/17/2018 15:24  Ref. Range 09/11/2017 21:59 09/12/2017 04:17 12/15/2017 09:59 02/17/2018 13:32  Ammonia Latest Ref Range: 9 - 35 umol/L 73 (H) 66 (H) 148 (H) 81 (H)     1515:  Pt received  short neb on arrival with improving in wheezing. IV narcan given with improvement of mental status. Family is at bedside, state pt is more awake now but "talking out of his head." Pt's speech continues garbled, which family states is not his baseline. No gross focal motor deficits in extremities. MRI brain ordered. Ammonia level mildly elevated, but improved from previous. Dx and testing d/w pt and family.  Questions answered.  Verb understanding, agreeable to admit. T/C returned from [facility] Dr. Nehemiah Settle, case discussed, including:  HPI, pertinent PM/SHx, VS/PE, dx testing, ED course and treatment:  Agreeable  to admit.    Final Clinical Impressions(s) / ED Diagnoses   Final diagnoses:  None    ED Discharge Orders    None       Francine Graven, DO 02/18/18 1404

## 2018-02-17 NOTE — ED Notes (Signed)
Family arrived and is at bedside.

## 2018-02-17 NOTE — H&P (Signed)
History and Physical  [name] [name] VHQ:[id number] DOB: 01/02/1961 DOA: 02/17/2018  Referring physician: Dr Thurnell Garbe, ED physician PCP: Hermine Messick, MD  Outpatient Specialists: none  Patient Coming From: Home  Chief Complaint: Confusion  HPI: [name] [name] is a 57 y.o. male with a history of cirrhosis from [place], diabetes, hypertension, dementia, depression, cognitive delay, bipolar, history of noncompliance.  Patient has been not taking his lactulose for the past 3 days has become increasingly somnolent, confused, with certain slurred speech.  Started shortly after not taking his lactulose.  Has a history of missing doses and noncompliance.  Patient is not a reliable historian, efforts to contact wife were not successful.  Symptoms are worsening.  No palliating or provoking factors.  Additionally, the patient has erythemic right lower extremity that is worsening.  Is tender to palpation.  No fevers, chills, nausea, vomiting.  Emergency Department Course: Patient started on clindamycin.  Ammonia level elevated to 80.  CT head negative.  Questionable CHF on chest x-ray, although patient asymptomatic without shortness of breath or orthopnea.  Review of systems unreliable due to dementia, though the patient is quite comfortable and denies any pain or other problems.  Past Medical History:  Diagnosis Date  . Anemia   . Anxiety   . Arthritis   . Bipolar 1 disorder ([place])   . Blind right eye   . Blood clots in brain    per patient  . Cardiomegaly   . Cataract    Right eye  . Cerebrovascular disease    Right vertebral artery  . Cholelithiasis    Asymptomatic  . Chronic back pain   . 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 Cirrhosis ([place])    diagnosed 07/2010 when presented with first episode of hepatic encephalopathy, afp on 416/13=4.8,  U/S on 12/14/11  liver stable, pt has received 2 hep A/B vaccines  . COPD (chronic obstructive pulmonary disease) ([place])   . Dementia   .  Depression   . Diabetes mellitus   . Diverticulosis   . Dyspnea   . GERD (gastroesophageal reflux disease)   . Hard of hearing   . Hepatic encephalopathy ([place])   . Hepatitis   . Hyperlipidemia   . Hypertension   . Mental retardation   . Noncompliance   . Seizures ([place])   . Thrombocytopenia ([place])    Past Surgical History:  Procedure Laterality Date  . CAST APPLICATION  11/22/3242   Procedure: MINOR CAST APPLICATION;  Surgeon: [name], MD;  Location: [facility];  Service: Orthopedics;  Laterality: Right;  no anesthesia , procedure room do not need or room !  . COLONOSCOPY  12/15/11   colonic divericulosis;poor prep, ACBE  recommended but has not been done  . COLONOSCOPY WITH PROPOFOL N/A 11/07/2017   Dr. Gala Romney: inadequate prep, internal hemorrhoids. Early interval due March 2020 due to poor prep  . ear operations    . ESOPHAGOGASTRODUODENOSCOPY  02/02/2011   Rourk-Normal esophagus.  No varices/Question mild portal gastropathy, extrinsic compression on the antrum, lesser curvature of uncertain significance, some minimally  nodular mucosa status post biopsy, patent pylorus, normal duodenum 1 and duodenum 2.  . ESOPHAGOGASTRODUODENOSCOPY  12/15/11   [place]ourk-->mild changes of portal gastropathy, gastric/duodenal erosions s/p bx  (reactive changes with mild chronic inflammation. No H.pylori  . ESOPHAGOGASTRODUODENOSCOPY (EGD) WITH PROPOFOL N/A 11/07/2017   Dr. Gala Romney: normal esophagus s/p dilation, portal hypertensive gastropathy, erosive gastropathy, normal duodenum  . FOOT SURGERY    . MALONEY DILATION  N/A 11/07/2017   Procedure: Venia Minks DILATION;  Surgeon: Daneil Dolin, MD;  Location: [facility];  Service: Endoscopy;  Laterality: N/A;  . ORIF ANKLE FRACTURE  08/27/2011   Procedure: OPEN REDUCTION INTERNAL FIXATION (ORIF) ANKLE FRACTURE;  Surgeon: [name], MD;  Location: [facility];  Service: Orthopedics;  Laterality: Right;  . TONSILLECTOMY     Social History:  reports that he  has been smoking cigarettes.  He has a 30.00 pack-year smoking history. He has never used smokeless tobacco. He reports that he does not drink alcohol or use drugs. Patient lives at home  Allergies  Allergen Reactions  . Penicillins Rash    Has patient had a PCN reaction causing immediate rash, facial/tongue/throat swelling, SOB or lightheadedness with hypotension: Yes Has patient had a PCN reaction causing severe rash involving mucus membranes or skin necrosis: No Has patient had a PCN reaction that required hospitalization No Has patient had a PCN reaction occurring within the last 10 years: No If all of the above answers are "NO", then may proceed with Cephalosporin use.     Family History  Problem Relation Age of Onset  . Heart failure Mother   . Thyroid disease Mother   . Cancer Father   . Asthma Brother   . Heart attack Brother   . Cirrhosis Brother        EtOH  . Colon cancer Maternal Aunt       Prior to Admission medications   Medication Sig Start Date End Date Taking? Authorizing Provider  albuterol (PROVENTIL HFA;VENTOLIN HFA) 108 (90 Base) MCG/ACT inhaler Inhale 2 puffs into the lungs every 4 (four) hours as needed for wheezing or shortness of breath.    [provider]  albuterol (PROVENTIL) (2.5 MG/3ML) 0.083% nebulizer solution Take 2.5 mg by nebulization every 6 (six) hours as needed for wheezing or shortness of breath.    [provider]  aspirin EC 81 MG tablet Take 81 mg by mouth daily.    [provider]  cholecalciferol (VITAMIN D) 1000 units tablet Take 2,000 Units by mouth daily.     [provider]  ezetimibe (ZETIA) 10 MG tablet Take 10 mg by mouth daily.    [provider]  furosemide (LASIX) 20 MG tablet Take 20 mg by mouth daily.    [provider]  gabapentin (NEURONTIN) 300 MG capsule Take 300 mg by mouth 3 (three) times daily.    [provider]  glimepiride (AMARYL) 2 MG tablet Take 2 mg  by mouth daily with breakfast.    [provider]  lactulose (CHRONULAC) 10 GM/15ML solution Take 60 mLs (40 g total) by mouth 3 (three) times daily. Titrate for 3-4 bowel motions daily. 12/15/16   Elgergawy, Silver Huguenin, MD  Magnesium 250 MG TABS Take 1 tablet by mouth daily.    [provider]  metoprolol tartrate (LOPRESSOR) 50 MG tablet Take 50 mg by mouth 3 (three) times daily.     [provider]  Multiple Vitamin (MULTIVITAMIN WITH MINERALS) TABS Take 1 tablet by mouth daily.    [provider]  Omega-3 Fatty Acids (FISH OIL) 1000 MG CAPS Take 1 capsule by mouth daily.    [provider]  omeprazole (PRILOSEC) 20 MG capsule Take 20 mg by mouth daily.    [provider]  oxyCODONE (OXY IR/ROXICODONE) 5 MG immediate release tablet Take 1 tablet by mouth 2 (two) times daily as needed. 10/04/17   [provider]  pantoprazole (PROTONIX) 40 MG tablet Take 1 tablet (40 mg total) by mouth daily. Before breakfast 10/10/17   Annitta Needs, NP  potassium chloride SA (K-DUR,KLOR-CON) 20 MEQ tablet Take 20 mEq by mouth daily.     [provider]  QUEtiapine (SEROQUEL) 25 MG tablet Take 1 tablet (25 mg total) by mouth at bedtime. Patient taking differently: Take 50 mg by mouth at bedtime.  12/31/16   Samuella Cota, MD  rifaximin (XIFAXAN) 550 MG TABS tablet Take 1 tablet (550 mg total) by mouth 2 (two) times daily. 12/31/16   Samuella Cota, MD  umeclidinium-vilanterol (ANORO ELLIPTA) 62.5-25 MCG/INH AEPB Inhale 1 puff into the lungs daily. 10/06/17   [provider]  vitamin B-12 (CYANOCOBALAMIN) 1000 MCG tablet Take 1,000 mcg by mouth daily.    [provider]    Physical Exam: BP (!) 102/58   Pulse 62   Temp 98.7 F (37.1 C) (Rectal)   Resp 16   Wt 102.5 kg (226 lb)   SpO2 93%   BMI 35.40 kg/m   . General: 25 Caucasian male. Awake and alert and oriented x2. No acute cardiopulmonary distress.   Marland Kitchen HEENT: Normocephalic atraumatic.  Right and left ears normal in appearance.  Pupils equal, round, reactive to light. Extraocular muscles are intact. Sclerae anicteric and noninjected.  Moist mucosal membranes. No mucosal lesions.  . Neck: Neck supple without lymphadenopathy. No carotid bruits. No masses palpated.  . Cardiovascular: Regular rate with normal S1-S2 sounds. No murmurs, rubs, gallops auscultated. No JVD.  Marland Kitchen Respiratory: Good respiratory effort with no wheezes, rales, rhonchi. Lungs clear to auscultation bilaterally.  No accessory muscle use. . Abdomen: Soft, tender liver margin, nondistended. Active bowel sounds. No masses or hepatosplenomegaly  . Skin: No rashes, lesions, or ulcerations.  Dry, warm to touch. 2+ dorsalis pedis and radial pulses. . Musculoskeletal: No calf or leg pain. All major joints not erythematous nontender.  No upper or lower joint deformation.  Good ROM.  No contractures  . Psychiatric: Unable to fully determine . Neurologic: No focal neurological deficits. Strength is 5/5 and symmetric in upper and lower extremities.  Cranial nerves II through XII are grossly intact.           Labs on Admission: I have personally reviewed following labs and imaging studies  CBC: Recent Labs  Lab 02/17/18 1332  WBC 3.1*  NEUTROABS 1.1*  HGB 13.4  HCT 40.4  MCV 103.6*  PLT 115*   Basic Metabolic Panel: Recent Labs  Lab 02/17/18 1332  NA 143  K 3.8  CL 106  CO2 31  GLUCOSE 119*  BUN 6  CREATININE 0.86  CALCIUM 8.7*   GFR: Estimated Creatinine Clearance: 106.9 mL/min (by C-G formula based on SCr of 0.86 mg/dL). Liver Function Tests: Recent Labs  Lab 02/17/18 1332  AST 62*  ALT 26  ALKPHOS 102  BILITOT 3.3*  PROT 6.3*  ALBUMIN 2.8*   No results for input(s): LIPASE, AMYLASE in the last 168 hours. Recent Labs  Lab 02/17/18 1332  AMMONIA 81*   Coagulation Profile: Recent Labs  Lab 02/17/18 1332  INR 1.41   Cardiac Enzymes: Recent Labs   Lab 02/17/18 1332  TROPONINI <0.03   BNP (last 3 results) No results for input(s): PROBNP in the last 8760 hours. HbA1C: No results for input(s): HGBA1C in the last 72 hours. CBG: Recent Labs  Lab 02/17/18 1308  GLUCAP 124*   Lipid Profile: No results for input(s): CHOL, HDL,  LDLCALC, TRIG, CHOLHDL, LDLDIRECT in the last 72 hours. Thyroid Function Tests: No results for input(s): TSH, T4TOTAL, FREET4, T3FREE, THYROIDAB in the last 72 hours. Anemia Panel: No results for input(s): VITAMINB12, FOLATE, FERRITIN, TIBC, IRON, RETICCTPCT in the last 72 hours. Urine analysis:    Component Value Date/Time   COLORURINE YELLOW 02/17/2018 1332   APPEARANCEUR CLEAR 02/17/2018 1332   LABSPEC 1.004 (L) 02/17/2018 1332   PHURINE 5.0 02/17/2018 1332   GLUCOSEU NEGATIVE 02/17/2018 1332   HGBUR SMALL (A) 02/17/2018 1332   BILIRUBINUR NEGATIVE 02/17/2018 1332   KETONESUR NEGATIVE 02/17/2018 1332   PROTEINUR NEGATIVE 02/17/2018 1332   UROBILINOGEN 1.0 01/03/2014 1748   NITRITE NEGATIVE 02/17/2018 1332   LEUKOCYTESUR NEGATIVE 02/17/2018 1332   Sepsis Labs: @LABRCNTIP (procalcitonin:4,lacticidven:4) )No results found for this or any previous visit (from the past 240 hour(s)).   Radiological Exams on Admission: Ct Head Wo Contrast  Result Date: 02/17/2018 CLINICAL DATA:  Altered mental status EXAM: CT HEAD WITHOUT CONTRAST TECHNIQUE: Contiguous axial images were obtained from the base of the skull through the vertex without intravenous contrast. COMPARISON:  09/11/2017 FINDINGS: Brain: Mild atrophic changes are noted. No findings to suggest acute hemorrhage, acute infarction or space-occupying mass lesion are seen. Vascular: No hyperdense vessel or unexpected calcification. Skull: Normal. Negative for fracture or focal lesion. 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 Other: None IMPRESSION: Chronic atrophic changes without acute abnormality. Chronic changes in the  right orbit. Electronically Signed   By: Inez Catalina M.D.   On: 02/17/2018 14:00   Dg Chest Portable 1 View  Result Date: 02/17/2018 CLINICAL DATA:  57 year old with wheezing and altered mental status. EXAM: PORTABLE CHEST 1 VIEW COMPARISON:  09/11/2017 FINDINGS: Prominent central vascular structures and lung markings are chronic. Heart size is mildly enlarged and stable. Trachea is midline. No acute bone abnormality. IMPRESSION: No acute cardiopulmonary disease. Chronic prominent lung markings. Findings could be associated with chronic pulmonary vascular congestion. Electronically Signed   By: Markus Daft M.D.   On: 02/17/2018 14:19    EKG: Independently reviewed.  Sinus rhythm.  No ST changes.  Unchanged from previous  Assessment/Plan: Active Problems:   DM (diabetes mellitus) ([facility])   HTN (hypertension)   Cirrhosis ([facility])   Seizure disorder ([facility])   GERD (gastroesophageal reflux disease)   Acute hepatic encephalopathy ([facility])   Cellulitis of right lower extremity    This patient was discussed with the ED physician, including pertinent vitals, physical exam findings, labs, and imaging.  We also discussed care given by the ED provider.  1. Acute hepatic encephalopathy  a. admit to telemetry b. Lactulose c. Follow-up ammonia level d. Repeat metabolic panel 2. Cellulitis of right lower extremity a. Continue clindamycin b. We will get ultrasound to rule out DVT 3. GERD a. Continue PPI 4. Seizure disorder a. Continue gabapentin 5. Cirrhosis 6. Hypertension a. Continue home medications 7. Diabetes a. Continue Amaryl b. Sliding scale insulin  DVT prophylaxis: Lovenox Consultants: None Code Status: Unable to clarify CODE STATUS with patient's wife.  Will assume full code at this point as the patient has had multiple changes from full code to DNR and back to full code. Family Communication: None Disposition Plan: Patient should be able to return home following admission   Herve Haug [place] [facility] Pager [phone]  If 7PM-7AM, please contact night-coverage [email or web address] Password [email or web address]

## 2018-02-17 NOTE — ED Notes (Signed)
EDP at bedside updating patient and family. 

## 2018-02-17 NOTE — ED Notes (Signed)
Condom catheter placed on pt at 1450.

## 2018-02-17 NOTE — ED Triage Notes (Addendum)
Pt brought in by EMS due to being lethargic for several days and thinks his ammonia level is up. EMS reports he slept during transport. CBG 116. Noted to have right lower ext redness which is reported as chronic. Noted to have exp wheezes. And reports episodes of diarrhea

## 2018-02-17 NOTE — ED Notes (Signed)
Pt's family left, states they will call tomorrow to see what room pt is in.

## 2018-02-18 DIAGNOSIS — K219 Gastro-esophageal reflux disease without esophagitis: Secondary | ICD-10-CM | POA: Diagnosis not present

## 2018-02-18 DIAGNOSIS — K7469 Other cirrhosis of liver: Secondary | ICD-10-CM | POA: Diagnosis not present

## 2018-02-18 DIAGNOSIS — E13628 Other specified diabetes mellitus with other skin complications: Secondary | ICD-10-CM | POA: Diagnosis not present

## 2018-02-18 DIAGNOSIS — K7200 Acute and subacute hepatic failure without coma: Secondary | ICD-10-CM

## 2018-02-18 DIAGNOSIS — L03115 Cellulitis of right lower limb: Secondary | ICD-10-CM

## 2018-02-18 DIAGNOSIS — G894 Chronic pain syndrome: Secondary | ICD-10-CM | POA: Diagnosis not present

## 2018-02-18 DIAGNOSIS — G40909 Epilepsy, unspecified, not intractable, without status epilepticus: Secondary | ICD-10-CM | POA: Diagnosis not present

## 2018-02-18 LAB — CBC
HCT: 36.9 % — ABNORMAL LOW (ref 39.0–52.0)
HEMOGLOBIN: 12 g/dL — AB (ref 13.0–17.0)
MCH: 33.9 pg (ref 26.0–34.0)
MCHC: 32.5 g/dL (ref 30.0–36.0)
MCV: 104.2 fL — ABNORMAL HIGH (ref 78.0–100.0)
Platelets: 86 10*3/uL — ABNORMAL LOW (ref 150–400)
RBC: 3.54 MIL/uL — ABNORMAL LOW (ref 4.22–5.81)
RDW: 14.1 % (ref 11.5–15.5)
WBC: 2.1 10*3/uL — ABNORMAL LOW (ref 4.0–10.5)

## 2018-02-18 LAB — BASIC METABOLIC PANEL
ANION GAP: 6 (ref 5–15)
BUN: 8 mg/dL (ref 6–20)
CO2: 29 mmol/L (ref 22–32)
CREATININE: 0.8 mg/dL (ref 0.61–1.24)
Calcium: 8.1 mg/dL — ABNORMAL LOW (ref 8.9–10.3)
Chloride: 107 mmol/L (ref 98–111)
GFR calc Af Amer: >60 mL/min (ref >60)
GFR calc non Af Amer: >60 mL/min (ref >60)
GLUCOSE: 96 mg/dL (ref 70–99)
Potassium: 3.5 mmol/L (ref 3.5–5.1)
SODIUM: 142 mmol/L (ref 135–145)

## 2018-02-18 LAB — URINE CULTURE

## 2018-02-18 LAB — GLUCOSE, CAPILLARY
GLUCOSE-CAPILLARY: 141 mg/dL — AB (ref 70–99)
Glucose-Capillary: 185 mg/dL — ABNORMAL HIGH (ref 70–99)

## 2018-02-18 LAB — AMMONIA: AMMONIA: 69 umol/L — AB (ref 9–35)

## 2018-02-18 MED ORDER — SACCHAROMYCES BOULARDII 250 MG PO CAPS: 250.0000 mg | ORAL_CAPSULE | Freq: Two times a day (BID) | ORAL | 0 refills | Status: DC

## 2018-02-18 MED ORDER — CLINDAMYCIN HCL 300 MG PO CAPS: 300.0000 mg | ORAL_CAPSULE | Freq: Three times a day (TID) | ORAL | 0 refills | Status: AC

## 2018-02-18 NOTE — Discharge Summary (Signed)
Physician Discharge Summary  [name] [name] QQP:[id number] DOB: [birth year] DOA: 02/17/2018  PCP: Hermine Messick, MD  Admit date: 02/17/2018 Discharge date: 02/18/2018  Time spent: 35 minutes  Recommendations for Outpatient Follow-up:  1. Repeat CMET to follow electrolytes, renal function and LFT's 2. Follow resolution of RLE cellulitis  3. Follow CBG's and adjust hypoglycemic regimen as needed    Discharge Diagnoses:  Principal Problem:   Acute hepatic encephalopathy ([place]) Active Problems:   DM (diabetes mellitus) ([place])   HTN (hypertension)   Cirrhosis (H[facility])   Seizure disorder (HCC)   GERD (gastroesophageal reflux disease)   Cellulitis of right lower extremity   Chronic pain syndrome   Discharge Condition: stable and improved. Discharge home on oral antibiotics and instructions to follow up with PCP in 10 days.  Diet recommendation: low sodium and modified carb diet   Filed Weights   02/17/18 1308 02/17/18 1829  Weight: 102.5 kg (226 lb) 101.2 kg (223 lb 1.7 oz)    History of present illness:  As per H&P written by Dr. Nehemiah Settle on 6/28 57 y.o. male with a history of cirrhosis from [place], diabetes, hypertension, dementia, depression, cognitive delay, bipolar, history of noncompliance.  Patient has been not taking his lactulose for the past 3 days has become increasingly somnolent, confused, with certain slurred speech.  Started shortly after not taking his lactulose.  Has a history of missing doses and noncompliance.  Patient is not a reliable historian, efforts to contact wife were not successful.  Symptoms are worsening.  No palliating or provoking factors.  Additionally, the patient has erythemic right lower extremity that is worsening.  Is tender to palpation.  No fevers, chills, nausea, vomiting.  Hospital Course:  1-acute hepatic encephalopathy Mentation back to baseline Ammonia level down to the 60s range (similar to what has been in the last 3 to 9-monthclose  ). Continue to use lactulose and minimize as much as possible sedative medications. -Patient has been started on Seroquel, and is also chronically using alprazolam and oxycodone; this medication can also contribute to his changes in mentation.  2-cellulitis RLE -significant improvement with antibiotics -swelling and redness down; no drainage -continue cleocin and complete a total of 10 days antibiotics (9 days pending at discharge).  3-GERD -continue PPI  4-hx of seizure  -no seizure appreciated -will continue gabapentin   5-NSAH cirrhosis -continue lasix, Chronulac and low sodium diet  -continue also BID rifaximin   6-type 2 diabetes  -resume modified carb diet  -outpatient follow up of CBG's and A1C; further adjustments to blood glycemic regimen as needed -SSI used while inpatient   7-dementia with behavioral disturbances -continue supportive care -resume home meds (including PRN alprazolam and QHS seroquel)  Procedures:  See below for x-ray reports   Consultations:  None   Discharge Exam: Vitals:   02/18/18 0726 02/18/18 1021  BP: 136/75   Pulse: 75   Resp:    Temp:    SpO2:  92%   General: Afebrile, no chest pain, mentation improved and essentially back to baseline.  No nausea, no vomiting. Cardiovascular: S1 and S2, no rubs, no gallops, no JVD. Respiratory: Good air movement bilaterally, no wheezing, no using accessory muscles. Abdomen: Soft, nontender, no ascites, positive bowel sounds. Extremities: Right lower extremity with decreased erythema and swelling, denies pain, no drainage.  Left lower extremity without any redness and just trace of edema.  Discharge Instructions   Discharge Instructions    Diet - low sodium heart healthy  Complete by:  As directed    Discharge instructions   Complete by:  As directed    Take medications as prescribed Maintain adequate hydration Complete antibiotics as instructed Arrange follow-up with PCP in 10  days. Continue low-sodium diet and the use of diuretics for his underlying cirrhosis as previously prescribed.     Allergies as of 02/18/2018      Reactions   Penicillins Rash   Has patient had a PCN reaction causing immediate rash, facial/tongue/throat swelling, SOB or lightheadedness with hypotension: Yes Has patient had a PCN reaction causing severe rash involving mucus membranes or skin necrosis: No Has patient had a PCN reaction that required hospitalization No Has patient had a PCN reaction occurring within the last 10 years: No If all of the above answers are "NO", then may proceed with Cephalosporin use.      Medication List    TAKE these medications   albuterol 108 (90 Base) MCG/ACT inhaler Commonly known as:  PROVENTIL HFA;VENTOLIN HFA Inhale 2 puffs into the lungs every 4 (four) hours as needed for wheezing or shortness of breath.   albuterol (2.5 MG/3ML) 0.083% nebulizer solution Commonly known as:  PROVENTIL Take 2.5 mg by nebulization every 6 (six) hours as needed for wheezing or shortness of breath.   ALPRAZolam 0.5 MG tablet Commonly known as:  XANAX Take 1 tablet by mouth 3 (three) times daily as needed.   aspirin EC 81 MG tablet Take 81 mg by mouth daily.   cholecalciferol 1000 units tablet Commonly known as:  VITAMIN D Take 2,000 Units by mouth daily.   clindamycin 300 MG capsule Commonly known as:  CLEOCIN Take 1 capsule (300 mg total) by mouth 3 (three) times daily for 9 days.   ezetimibe 10 MG tablet Commonly known as:  ZETIA Take 10 mg by mouth daily.   Fish Oil 1000 MG Caps Take 1 capsule by mouth daily.   furosemide 20 MG tablet Commonly known as:  LASIX Take 20 mg by mouth daily.   lactulose 10 GM/15ML solution Commonly known as:  CHRONULAC Take 60 mLs (40 g total) by mouth 3 (three) times daily. Titrate for 3-4 bowel motions daily. What changed:    how much to take  additional instructions   Magnesium 250 MG Tabs Take 1 tablet by  mouth daily.   metoprolol tartrate 50 MG tablet Commonly known as:  LOPRESSOR Take 50 mg by mouth 3 (three) times daily.   multivitamin with minerals Tabs tablet Take 1 tablet by mouth daily.   oxyCODONE 5 MG immediate release tablet Commonly known as:  Oxy IR/ROXICODONE Take 1 tablet by mouth 3 (three) times daily.   pantoprazole 40 MG tablet Commonly known as:  PROTONIX Take 40 mg by mouth daily.   potassium chloride SA 20 MEQ tablet Commonly known as:  K-DUR,KLOR-CON Take 20 mEq by mouth daily.   QUEtiapine 25 MG tablet Commonly known as:  SEROQUEL Take 1 tablet (25 mg total) by mouth at bedtime. What changed:  how much to take   rifaximin 550 MG Tabs tablet Commonly known as:  XIFAXAN Take 1 tablet (550 mg total) by mouth 2 (two) times daily.   saccharomyces boulardii 250 MG capsule Commonly known as:  FLORASTOR Take 1 capsule (250 mg total) by mouth 2 (two) times daily.   silver sulfADIAZINE 1 % cream Commonly known as:  SILVADENE Apply 1 application topically daily. As directed   umeclidinium-vilanterol 62.5-25 MCG/INH Aepb Commonly known as:  ANORO ELLIPTA  Inhale 1 puff into the lungs daily.   vitamin B-12 1000 MCG tablet Commonly known as:  CYANOCOBALAMIN Take 1,000 mcg by mouth daily.      Allergies  Allergen Reactions  . Penicillins Rash    Has patient had a PCN reaction causing immediate rash, facial/tongue/throat swelling, SOB or lightheadedness with hypotension: Yes Has patient had a PCN reaction causing severe rash involving mucus membranes or skin necrosis: No Has patient had a PCN reaction that required hospitalization No Has patient had a PCN reaction occurring within the last 10 years: No If all of the above answers are "NO", then may proceed with Cephalosporin use.    Follow-up Information    Hermine Messick, MD. Schedule an appointment as soon as possible for a visit in 10 day(s).   Specialty:  Family Medicine Contact information: [address] [place] [address] [phone]           The results of significant diagnostics from this hospitalization (including imaging, microbiology, ancillary and laboratory) are listed below for reference.    Significant Diagnostic Studies: Ct Head Wo Contrast  Result Date: 02/17/2018 CLINICAL DATA:  Altered mental status EXAM: CT HEAD WITHOUT CONTRAST TECHNIQUE: Contiguous axial images were obtained from the base of the skull through the vertex without intravenous contrast. COMPARISON:  09/11/2017 FINDINGS: Brain: Mild atrophic changes are noted. No findings to suggest acute hemorrhage, acute infarction or space-occupying mass lesion are seen. Vascular: No hyperdense vessel or unexpected calcification. Skull: Normal. Negative for fracture or focal lesion. 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 Other: None IMPRESSION: Chronic atrophic changes without acute abnormality. Chronic changes in the right orbit. Electronically Signed   By: Inez Catalina M.D.   On: 02/17/2018 14:00   US Venous Img Lower Unilateral Right  Result Date: 02/17/2018 CLINICAL DATA:  57 year old with right lower extremity edema and redness. EXAM: RIGHT LOWER EXTREMITY VENOUS DOPPLER ULTRASOUND TECHNIQUE: Gray-scale sonography with graded compression, as well as color Doppler and duplex ultrasound were performed to evaluate the lower extremity deep venous systems from the level of the common femoral vein and including the common femoral, femoral, profunda femoral, popliteal and calf veins including the posterior tibial, peroneal and gastrocnemius veins when visible. The superficial great saphenous vein was also interrogated. Spectral Doppler was utilized to evaluate flow at rest and with distal augmentation maneuvers in the common femoral, femoral and popliteal veins. COMPARISON:  None. FINDINGS: Contralateral Common Femoral Vein: Respiratory phasicity is normal and symmetric with the  symptomatic side. No evidence of thrombus. Normal compressibility. Common Femoral Vein: No evidence of thrombus. Normal compressibility, respiratory phasicity and response to augmentation. Saphenofemoral Junction: No evidence of thrombus. Normal compressibility and flow on color Doppler imaging. Profunda Femoral Vein: No evidence of thrombus. Normal compressibility and flow on color Doppler imaging. Femoral Vein: No evidence of thrombus. Normal compressibility, respiratory phasicity and response to augmentation. Popliteal Vein: No evidence of thrombus. Normal compressibility, respiratory phasicity and response to augmentation. Calf Veins: Visualized right deep calf veins are patent without thrombus. Superficial Great Saphenous Vein: No evidence of thrombus. Normal compressibility. Other Findings:  None. IMPRESSION: Negative for deep venous thrombosis in right lower extremity. Electronically Signed   By: Markus Daft M.D.   On: 02/17/2018 16:51   Dg Chest Portable 1 View  Result Date: 02/17/2018 CLINICAL DATA:  57 year old with wheezing and altered mental status. EXAM: PORTABLE CHEST 1 VIEW COMPARISON:  09/11/2017 FINDINGS: Prominent central vascular structures and lung markings are  chronic. Heart size is mildly enlarged and stable. Trachea is midline. No acute bone abnormality. IMPRESSION: No acute cardiopulmonary disease. Chronic prominent lung markings. Findings could be associated with chronic pulmonary vascular congestion. Electronically Signed   By: Markus Daft M.D.   On: 02/17/2018 14:19    Microbiology: Recent Results (from the past 240 hour(s))  Urine culture     Status: Abnormal   Collection Time: 02/17/18  1:32 PM  Result Value Ref Range Status   Specimen Description   Final    URINE, CLEAN CATCH Performed at [facility], [address], [place], [place] [address]    Special Requests   Final    NONE Performed at [facility], [address], [place], [place] [address]    Culture MULTIPLE  SPECIES PRESENT, SUGGEST RECOLLECTION (A)  Final   Report Status 02/18/2018 FINAL  Final     Labs: Basic Metabolic Panel: Recent Labs  Lab 02/17/18 1332 02/18/18 0521  NA 143 142  K 3.8 3.5  CL 106 107  CO2 31 29  GLUCOSE 119* 96  BUN 6 8  CREATININE 0.86 0.80  CALCIUM 8.7* 8.1*   Liver Function Tests: Recent Labs  Lab 02/17/18 1332  AST 62*  ALT 26  ALKPHOS 102  BILITOT 3.3*  PROT 6.3*  ALBUMIN 2.8*    Recent Labs  Lab 02/17/18 1332 02/18/18 0521  AMMONIA 81* 69*   CBC: Recent Labs  Lab 02/17/18 1332 02/18/18 0521  WBC 3.1* 2.1*  NEUTROABS 1.1*  --   HGB 13.4 12.0*  HCT 40.4 36.9*  MCV 103.6* 104.2*  PLT 108* 86*   Cardiac Enzymes: Recent Labs  Lab 02/17/18 1332  TROPONINI <0.03   BNP: BNP (last 3 results) Recent Labs    02/17/18 1536  BNP 56.0    CBG: Recent Labs  Lab 02/17/18 1308 02/17/18 2114 02/18/18 0819 02/18/18 1102  GLUCAP 124* 106* 141* 185*     Signed:  Barton Dubois MD.  [facility] 02/18/2018, 3:06 PM

## 2018-02-24 DIAGNOSIS — I10 Essential (primary) hypertension: Secondary | ICD-10-CM | POA: Diagnosis not present

## 2018-02-24 DIAGNOSIS — G894 Chronic pain syndrome: Secondary | ICD-10-CM | POA: Diagnosis not present

## 2018-02-24 DIAGNOSIS — L03115 Cellulitis of right lower limb: Secondary | ICD-10-CM | POA: Diagnosis not present

## 2018-02-24 DIAGNOSIS — Z09 Encounter for follow-up examination after completed treatment for conditions other than malignant neoplasm: Secondary | ICD-10-CM | POA: Diagnosis not present

## 2018-03-02 DIAGNOSIS — I6350 Cerebral infarction due to unspecified occlusion or stenosis of unspecified cerebral artery: Secondary | ICD-10-CM | POA: Diagnosis not present

## 2018-03-02 DIAGNOSIS — M86671 Other chronic osteomyelitis, right ankle and foot: Secondary | ICD-10-CM | POA: Diagnosis not present

## 2018-03-02 DIAGNOSIS — M6281 Muscle weakness (generalized): Secondary | ICD-10-CM | POA: Diagnosis not present

## 2018-03-02 DIAGNOSIS — E1149 Type 2 diabetes mellitus with other diabetic neurological complication: Secondary | ICD-10-CM | POA: Diagnosis not present

## 2018-03-02 DIAGNOSIS — K7460 Unspecified cirrhosis of liver: Secondary | ICD-10-CM | POA: Diagnosis not present

## 2018-03-02 DIAGNOSIS — E1165 Type 2 diabetes mellitus with hyperglycemia: Secondary | ICD-10-CM | POA: Diagnosis not present

## 2018-03-02 DIAGNOSIS — E11621 Type 2 diabetes mellitus with foot ulcer: Secondary | ICD-10-CM | POA: Diagnosis not present

## 2018-03-02 DIAGNOSIS — R262 Difficulty in walking, not elsewhere classified: Secondary | ICD-10-CM | POA: Diagnosis not present

## 2018-03-02 DIAGNOSIS — S82309A Unspecified fracture of lower end of unspecified tibia, initial encounter for closed fracture: Secondary | ICD-10-CM | POA: Diagnosis not present

## 2018-03-02 DIAGNOSIS — J449 Chronic obstructive pulmonary disease, unspecified: Secondary | ICD-10-CM | POA: Diagnosis not present

## 2018-03-21 DIAGNOSIS — R7881 Bacteremia: Secondary | ICD-10-CM | POA: Diagnosis not present

## 2018-03-21 DIAGNOSIS — I872 Venous insufficiency (chronic) (peripheral): Secondary | ICD-10-CM | POA: Diagnosis not present

## 2018-03-21 DIAGNOSIS — E11622 Type 2 diabetes mellitus with other skin ulcer: Secondary | ICD-10-CM | POA: Diagnosis not present

## 2018-03-21 DIAGNOSIS — R001 Bradycardia, unspecified: Secondary | ICD-10-CM | POA: Diagnosis not present

## 2018-03-21 DIAGNOSIS — H547 Unspecified visual loss: Secondary | ICD-10-CM | POA: Diagnosis not present

## 2018-03-21 DIAGNOSIS — I110 Hypertensive heart disease with heart failure: Secondary | ICD-10-CM | POA: Diagnosis not present

## 2018-03-21 DIAGNOSIS — K7460 Unspecified cirrhosis of liver: Secondary | ICD-10-CM | POA: Diagnosis not present

## 2018-03-21 DIAGNOSIS — J449 Chronic obstructive pulmonary disease, unspecified: Secondary | ICD-10-CM | POA: Diagnosis not present

## 2018-03-21 DIAGNOSIS — E119 Type 2 diabetes mellitus without complications: Secondary | ICD-10-CM | POA: Diagnosis not present

## 2018-03-21 DIAGNOSIS — I8311 Varicose veins of right lower extremity with inflammation: Secondary | ICD-10-CM | POA: Diagnosis not present

## 2018-03-21 DIAGNOSIS — K5790 Diverticulosis of intestine, part unspecified, without perforation or abscess without bleeding: Secondary | ICD-10-CM | POA: Diagnosis not present

## 2018-03-21 DIAGNOSIS — I87301 Chronic venous hypertension (idiopathic) without complications of right lower extremity: Secondary | ICD-10-CM | POA: Diagnosis not present

## 2018-03-21 DIAGNOSIS — D72819 Decreased white blood cell count, unspecified: Secondary | ICD-10-CM | POA: Diagnosis not present

## 2018-03-21 DIAGNOSIS — L97211 Non-pressure chronic ulcer of right calf limited to breakdown of skin: Secondary | ICD-10-CM | POA: Diagnosis not present

## 2018-03-21 DIAGNOSIS — K219 Gastro-esophageal reflux disease without esophagitis: Secondary | ICD-10-CM | POA: Diagnosis not present

## 2018-03-21 DIAGNOSIS — L0889 Other specified local infections of the skin and subcutaneous tissue: Secondary | ICD-10-CM | POA: Diagnosis not present

## 2018-03-21 DIAGNOSIS — M79605 Pain in left leg: Secondary | ICD-10-CM | POA: Diagnosis not present

## 2018-03-21 DIAGNOSIS — M79604 Pain in right leg: Secondary | ICD-10-CM | POA: Diagnosis not present

## 2018-03-21 DIAGNOSIS — R69 Illness, unspecified: Secondary | ICD-10-CM | POA: Diagnosis not present

## 2018-03-21 DIAGNOSIS — L03115 Cellulitis of right lower limb: Secondary | ICD-10-CM | POA: Diagnosis not present

## 2018-03-21 DIAGNOSIS — K7469 Other cirrhosis of liver: Secondary | ICD-10-CM | POA: Diagnosis not present

## 2018-03-21 DIAGNOSIS — A419 Sepsis, unspecified organism: Secondary | ICD-10-CM | POA: Diagnosis not present

## 2018-03-21 DIAGNOSIS — G8929 Other chronic pain: Secondary | ICD-10-CM | POA: Diagnosis not present

## 2018-03-21 DIAGNOSIS — I509 Heart failure, unspecified: Secondary | ICD-10-CM | POA: Diagnosis not present

## 2018-03-21 DIAGNOSIS — K8080 Other cholelithiasis without obstruction: Secondary | ICD-10-CM | POA: Diagnosis not present

## 2018-03-21 DIAGNOSIS — G40909 Epilepsy, unspecified, not intractable, without status epilepticus: Secondary | ICD-10-CM | POA: Diagnosis not present

## 2018-03-21 DIAGNOSIS — I2510 Atherosclerotic heart disease of native coronary artery without angina pectoris: Secondary | ICD-10-CM | POA: Diagnosis not present

## 2018-03-21 DIAGNOSIS — D61818 Other pancytopenia: Secondary | ICD-10-CM | POA: Diagnosis not present

## 2018-03-21 DIAGNOSIS — K7290 Hepatic failure, unspecified without coma: Secondary | ICD-10-CM | POA: Diagnosis not present

## 2018-03-21 DIAGNOSIS — M7989 Other specified soft tissue disorders: Secondary | ICD-10-CM | POA: Diagnosis not present

## 2018-03-21 DIAGNOSIS — K7581 Nonalcoholic steatohepatitis (NASH): Secondary | ICD-10-CM | POA: Diagnosis not present

## 2018-03-21 DIAGNOSIS — B9689 Other specified bacterial agents as the cause of diseases classified elsewhere: Secondary | ICD-10-CM | POA: Diagnosis not present

## 2018-03-21 DIAGNOSIS — R209 Unspecified disturbances of skin sensation: Secondary | ICD-10-CM | POA: Diagnosis not present

## 2018-03-28 DIAGNOSIS — Z6837 Body mass index (BMI) 37.0-37.9, adult: Secondary | ICD-10-CM | POA: Diagnosis not present

## 2018-03-28 DIAGNOSIS — B9689 Other specified bacterial agents as the cause of diseases classified elsewhere: Secondary | ICD-10-CM | POA: Diagnosis not present

## 2018-03-28 DIAGNOSIS — E663 Overweight: Secondary | ICD-10-CM | POA: Diagnosis not present

## 2018-03-28 DIAGNOSIS — L03115 Cellulitis of right lower limb: Secondary | ICD-10-CM | POA: Diagnosis not present

## 2018-03-28 DIAGNOSIS — I110 Hypertensive heart disease with heart failure: Secondary | ICD-10-CM | POA: Diagnosis not present

## 2018-03-28 DIAGNOSIS — I87329 Chronic venous hypertension (idiopathic) with inflammation of unspecified lower extremity: Secondary | ICD-10-CM | POA: Diagnosis not present

## 2018-03-28 DIAGNOSIS — J449 Chronic obstructive pulmonary disease, unspecified: Secondary | ICD-10-CM | POA: Diagnosis not present

## 2018-03-28 DIAGNOSIS — Z4800 Encounter for change or removal of nonsurgical wound dressing: Secondary | ICD-10-CM | POA: Diagnosis not present

## 2018-03-28 DIAGNOSIS — E119 Type 2 diabetes mellitus without complications: Secondary | ICD-10-CM | POA: Diagnosis not present

## 2018-03-28 DIAGNOSIS — I509 Heart failure, unspecified: Secondary | ICD-10-CM | POA: Diagnosis not present

## 2018-03-31 DIAGNOSIS — Z6837 Body mass index (BMI) 37.0-37.9, adult: Secondary | ICD-10-CM | POA: Diagnosis not present

## 2018-03-31 DIAGNOSIS — I110 Hypertensive heart disease with heart failure: Secondary | ICD-10-CM | POA: Diagnosis not present

## 2018-03-31 DIAGNOSIS — I509 Heart failure, unspecified: Secondary | ICD-10-CM | POA: Diagnosis not present

## 2018-03-31 DIAGNOSIS — L03115 Cellulitis of right lower limb: Secondary | ICD-10-CM | POA: Diagnosis not present

## 2018-03-31 DIAGNOSIS — I87329 Chronic venous hypertension (idiopathic) with inflammation of unspecified lower extremity: Secondary | ICD-10-CM | POA: Diagnosis not present

## 2018-03-31 DIAGNOSIS — Z4800 Encounter for change or removal of nonsurgical wound dressing: Secondary | ICD-10-CM | POA: Diagnosis not present

## 2018-03-31 DIAGNOSIS — B9689 Other specified bacterial agents as the cause of diseases classified elsewhere: Secondary | ICD-10-CM | POA: Diagnosis not present

## 2018-03-31 DIAGNOSIS — E119 Type 2 diabetes mellitus without complications: Secondary | ICD-10-CM | POA: Diagnosis not present

## 2018-03-31 DIAGNOSIS — J449 Chronic obstructive pulmonary disease, unspecified: Secondary | ICD-10-CM | POA: Diagnosis not present

## 2018-03-31 DIAGNOSIS — E663 Overweight: Secondary | ICD-10-CM | POA: Diagnosis not present

## 2018-04-02 DIAGNOSIS — M86671 Other chronic osteomyelitis, right ankle and foot: Secondary | ICD-10-CM | POA: Diagnosis not present

## 2018-04-02 DIAGNOSIS — J449 Chronic obstructive pulmonary disease, unspecified: Secondary | ICD-10-CM | POA: Diagnosis not present

## 2018-04-02 DIAGNOSIS — S82309A Unspecified fracture of lower end of unspecified tibia, initial encounter for closed fracture: Secondary | ICD-10-CM | POA: Diagnosis not present

## 2018-04-02 DIAGNOSIS — E1149 Type 2 diabetes mellitus with other diabetic neurological complication: Secondary | ICD-10-CM | POA: Diagnosis not present

## 2018-04-02 DIAGNOSIS — E1165 Type 2 diabetes mellitus with hyperglycemia: Secondary | ICD-10-CM | POA: Diagnosis not present

## 2018-04-02 DIAGNOSIS — I6350 Cerebral infarction due to unspecified occlusion or stenosis of unspecified cerebral artery: Secondary | ICD-10-CM | POA: Diagnosis not present

## 2018-04-02 DIAGNOSIS — M6281 Muscle weakness (generalized): Secondary | ICD-10-CM | POA: Diagnosis not present

## 2018-04-02 DIAGNOSIS — E11621 Type 2 diabetes mellitus with foot ulcer: Secondary | ICD-10-CM | POA: Diagnosis not present

## 2018-04-02 DIAGNOSIS — K7460 Unspecified cirrhosis of liver: Secondary | ICD-10-CM | POA: Diagnosis not present

## 2018-04-02 DIAGNOSIS — R262 Difficulty in walking, not elsewhere classified: Secondary | ICD-10-CM | POA: Diagnosis not present

## 2018-04-04 DIAGNOSIS — L03115 Cellulitis of right lower limb: Secondary | ICD-10-CM | POA: Diagnosis not present

## 2018-04-04 DIAGNOSIS — I10 Essential (primary) hypertension: Secondary | ICD-10-CM | POA: Diagnosis not present

## 2018-04-04 DIAGNOSIS — Z09 Encounter for follow-up examination after completed treatment for conditions other than malignant neoplasm: Secondary | ICD-10-CM | POA: Diagnosis not present

## 2018-04-05 DIAGNOSIS — I110 Hypertensive heart disease with heart failure: Secondary | ICD-10-CM | POA: Diagnosis not present

## 2018-04-05 DIAGNOSIS — B9689 Other specified bacterial agents as the cause of diseases classified elsewhere: Secondary | ICD-10-CM | POA: Diagnosis not present

## 2018-04-05 DIAGNOSIS — J449 Chronic obstructive pulmonary disease, unspecified: Secondary | ICD-10-CM | POA: Diagnosis not present

## 2018-04-05 DIAGNOSIS — E119 Type 2 diabetes mellitus without complications: Secondary | ICD-10-CM | POA: Diagnosis not present

## 2018-04-05 DIAGNOSIS — I87329 Chronic venous hypertension (idiopathic) with inflammation of unspecified lower extremity: Secondary | ICD-10-CM | POA: Diagnosis not present

## 2018-04-05 DIAGNOSIS — Z6837 Body mass index (BMI) 37.0-37.9, adult: Secondary | ICD-10-CM | POA: Diagnosis not present

## 2018-04-05 DIAGNOSIS — Z4800 Encounter for change or removal of nonsurgical wound dressing: Secondary | ICD-10-CM | POA: Diagnosis not present

## 2018-04-05 DIAGNOSIS — E663 Overweight: Secondary | ICD-10-CM | POA: Diagnosis not present

## 2018-04-05 DIAGNOSIS — L03115 Cellulitis of right lower limb: Secondary | ICD-10-CM | POA: Diagnosis not present

## 2018-04-05 DIAGNOSIS — I509 Heart failure, unspecified: Secondary | ICD-10-CM | POA: Diagnosis not present

## 2018-04-18 DIAGNOSIS — Z7409 Other reduced mobility: Secondary | ICD-10-CM | POA: Diagnosis not present

## 2018-04-18 DIAGNOSIS — L03115 Cellulitis of right lower limb: Secondary | ICD-10-CM | POA: Diagnosis not present

## 2018-04-18 DIAGNOSIS — I890 Lymphedema, not elsewhere classified: Secondary | ICD-10-CM | POA: Diagnosis not present

## 2018-04-18 DIAGNOSIS — M21961 Unspecified acquired deformity of right lower leg: Secondary | ICD-10-CM | POA: Diagnosis not present

## 2018-04-18 DIAGNOSIS — G894 Chronic pain syndrome: Secondary | ICD-10-CM | POA: Diagnosis not present

## 2018-04-18 DIAGNOSIS — F1190 Opioid use, unspecified, uncomplicated: Secondary | ICD-10-CM | POA: Diagnosis not present

## 2018-04-18 DIAGNOSIS — E1142 Type 2 diabetes mellitus with diabetic polyneuropathy: Secondary | ICD-10-CM | POA: Diagnosis not present

## 2018-04-18 DIAGNOSIS — I10 Essential (primary) hypertension: Secondary | ICD-10-CM | POA: Diagnosis not present

## 2018-04-18 DIAGNOSIS — R69 Illness, unspecified: Secondary | ICD-10-CM | POA: Diagnosis not present

## 2018-04-18 DIAGNOSIS — Z79899 Other long term (current) drug therapy: Secondary | ICD-10-CM | POA: Diagnosis not present

## 2018-04-18 DIAGNOSIS — F319 Bipolar disorder, unspecified: Secondary | ICD-10-CM | POA: Diagnosis not present

## 2018-04-19 ENCOUNTER — Ambulatory Visit: Payer: Medicare HMO | Admitting: Gastroenterology

## 2018-04-24 ENCOUNTER — Emergency Department (HOSPITAL_COMMUNITY): Payer: Medicare HMO

## 2018-04-24 ENCOUNTER — Observation Stay (HOSPITAL_COMMUNITY)
Admission: EM | Admit: 2018-04-24 | Discharge: 2018-04-26 | Disposition: A | Discharge status: Home / Self Care | Payer: Medicare HMO | Attending: Internal Medicine | Admitting: Internal Medicine

## 2018-04-24 ENCOUNTER — Encounter (HOSPITAL_COMMUNITY): Payer: Self-pay | Admitting: Emergency Medicine

## 2018-04-24 ENCOUNTER — Other Ambulatory Visit: Payer: Self-pay

## 2018-04-24 DIAGNOSIS — K7460 Unspecified cirrhosis of liver: Secondary | ICD-10-CM | POA: Diagnosis not present

## 2018-04-24 DIAGNOSIS — R4781 Slurred speech: Secondary | ICD-10-CM | POA: Diagnosis not present

## 2018-04-24 DIAGNOSIS — K7290 Hepatic failure, unspecified without coma: Secondary | ICD-10-CM | POA: Insufficient documentation

## 2018-04-24 DIAGNOSIS — F17210 Nicotine dependence, cigarettes, uncomplicated: Secondary | ICD-10-CM | POA: Diagnosis not present

## 2018-04-24 DIAGNOSIS — J449 Chronic obstructive pulmonary disease, unspecified: Secondary | ICD-10-CM | POA: Diagnosis present

## 2018-04-24 DIAGNOSIS — D696 Thrombocytopenia, unspecified: Secondary | ICD-10-CM | POA: Diagnosis not present

## 2018-04-24 DIAGNOSIS — E119 Type 2 diabetes mellitus without complications: Secondary | ICD-10-CM | POA: Insufficient documentation

## 2018-04-24 DIAGNOSIS — K7682 Hepatic encephalopathy: Secondary | ICD-10-CM | POA: Diagnosis present

## 2018-04-24 DIAGNOSIS — G40909 Epilepsy, unspecified, not intractable, without status epilepticus: Secondary | ICD-10-CM | POA: Diagnosis not present

## 2018-04-24 DIAGNOSIS — W19XXXA Unspecified fall, initial encounter: Secondary | ICD-10-CM | POA: Diagnosis not present

## 2018-04-24 DIAGNOSIS — E7220 Disorder of urea cycle metabolism, unspecified: Secondary | ICD-10-CM | POA: Diagnosis not present

## 2018-04-24 DIAGNOSIS — Z7982 Long term (current) use of aspirin: Secondary | ICD-10-CM | POA: Insufficient documentation

## 2018-04-24 DIAGNOSIS — R410 Disorientation, unspecified: Secondary | ICD-10-CM | POA: Diagnosis not present

## 2018-04-24 DIAGNOSIS — F0390 Unspecified dementia without behavioral disturbance: Secondary | ICD-10-CM | POA: Diagnosis not present

## 2018-04-24 DIAGNOSIS — R531 Weakness: Secondary | ICD-10-CM

## 2018-04-24 DIAGNOSIS — Z79899 Other long term (current) drug therapy: Secondary | ICD-10-CM | POA: Diagnosis not present

## 2018-04-24 DIAGNOSIS — R4182 Altered mental status, unspecified: Secondary | ICD-10-CM | POA: Diagnosis present

## 2018-04-24 DIAGNOSIS — K219 Gastro-esophageal reflux disease without esophagitis: Secondary | ICD-10-CM | POA: Diagnosis not present

## 2018-04-24 DIAGNOSIS — I10 Essential (primary) hypertension: Secondary | ICD-10-CM | POA: Diagnosis not present

## 2018-04-24 DIAGNOSIS — R5381 Other malaise: Secondary | ICD-10-CM | POA: Diagnosis not present

## 2018-04-24 DIAGNOSIS — R400 Somnolence: Secondary | ICD-10-CM | POA: Diagnosis not present

## 2018-04-24 HISTORY — DX: Unspecified acquired deformity of right lower leg: M21.961

## 2018-04-24 HISTORY — DX: Lymphedema, not elsewhere classified: I89.0

## 2018-04-24 HISTORY — DX: Other chronic pain: G89.29

## 2018-04-24 LAB — COMPREHENSIVE METABOLIC PANEL
ALBUMIN: 3.4 g/dL — AB (ref 3.5–5.0)
ALT: 31 U/L (ref 0–44)
ANION GAP: 6 (ref 5–15)
AST: 67 U/L — ABNORMAL HIGH (ref 15–41)
Alkaline Phosphatase: 113 U/L (ref 38–126)
BUN: 7 mg/dL (ref 6–20)
CO2: 26 mmol/L (ref 22–32)
Calcium: 9.3 mg/dL (ref 8.9–10.3)
Chloride: 110 mmol/L (ref 98–111)
Creatinine, Ser: 0.73 mg/dL (ref 0.61–1.24)
GFR calc non Af Amer: >60 mL/min (ref >60)
GLUCOSE: 98 mg/dL (ref 70–99)
POTASSIUM: 4 mmol/L (ref 3.5–5.1)
SODIUM: 142 mmol/L (ref 135–145)
Total Bilirubin: 2.9 mg/dL — ABNORMAL HIGH (ref 0.3–1.2)
Total Protein: 7 g/dL (ref 6.5–8.1)

## 2018-04-24 LAB — CBC WITH DIFFERENTIAL/PLATELET
Basophils Absolute: 0 10*3/uL (ref 0.0–0.1)
Basophils Relative: 1 %
EOS ABS: 0.3 10*3/uL (ref 0.0–0.7)
Eosinophils Relative: 6 %
HCT: 42 % (ref 39.0–52.0)
Hemoglobin: 14.3 g/dL (ref 13.0–17.0)
LYMPHS ABS: 1.2 10*3/uL (ref 0.7–4.0)
Lymphocytes Relative: 31 %
MCH: 35.2 pg — AB (ref 26.0–34.0)
MCHC: 34 g/dL (ref 30.0–36.0)
MCV: 103.4 fL — ABNORMAL HIGH (ref 78.0–100.0)
MONO ABS: 0.4 10*3/uL (ref 0.1–1.0)
MONOS PCT: 11 %
Neutro Abs: 2.1 10*3/uL (ref 1.7–7.7)
Neutrophils Relative %: 51 %
Platelets: 66 10*3/uL — ABNORMAL LOW (ref 150–400)
RBC: 4.06 MIL/uL — ABNORMAL LOW (ref 4.22–5.81)
RDW: 15.8 % — AB (ref 11.5–15.5)
WBC: 4 10*3/uL (ref 4.0–10.5)

## 2018-04-24 LAB — RAPID URINE DRUG SCREEN, HOSP PERFORMED
Amphetamines: NOT DETECTED
BENZODIAZEPINES: POSITIVE — AB
Barbiturates: NOT DETECTED
COCAINE: NOT DETECTED
Opiates: NOT DETECTED
Tetrahydrocannabinol: NOT DETECTED

## 2018-04-24 LAB — AMMONIA: AMMONIA: 69 umol/L — AB (ref 9–35)

## 2018-04-24 LAB — BRAIN NATRIURETIC PEPTIDE: B NATRIURETIC PEPTIDE 5: 142 pg/mL — AB (ref 0.0–100.0)

## 2018-04-24 LAB — PROTIME-INR
INR: 1.35
PROTHROMBIN TIME: 16.6 s — AB (ref 11.4–15.2)

## 2018-04-24 LAB — URINALYSIS, ROUTINE W REFLEX MICROSCOPIC
Bilirubin Urine: NEGATIVE
Glucose, UA: NEGATIVE mg/dL
HGB URINE DIPSTICK: NEGATIVE
KETONES UR: NEGATIVE mg/dL
Leukocytes, UA: NEGATIVE
NITRITE: NEGATIVE
PROTEIN: NEGATIVE mg/dL
SPECIFIC GRAVITY, URINE: 1.013 (ref 1.005–1.030)
pH: 7 (ref 5.0–8.0)

## 2018-04-24 LAB — MAGNESIUM: MAGNESIUM: 2.2 mg/dL (ref 1.7–2.4)

## 2018-04-24 LAB — TROPONIN I: Troponin I: <0.03 ng/mL (ref <0.03)

## 2018-04-24 LAB — GLUCOSE, CAPILLARY: GLUCOSE-CAPILLARY: 80 mg/dL (ref 70–99)

## 2018-04-24 MED ORDER — ONDANSETRON HCL 4 MG/2ML IJ SOLN: 4.0000 mg | Freq: Four times a day (QID) | INTRAMUSCULAR | Status: DC | PRN

## 2018-04-24 MED ORDER — LACTULOSE ENEMA
300.0000 mL | Freq: Once | ORAL | Status: AC
  Administered 2018-04-24: 300 mL via RECTAL
  Filled 2018-04-24: qty 300

## 2018-04-24 MED ORDER — ENOXAPARIN SODIUM 40 MG/0.4ML ~~LOC~~ SOLN: 40.0000 mg | SUBCUTANEOUS | Status: DC

## 2018-04-24 MED ORDER — ONDANSETRON HCL 4 MG PO TABS: 4.0000 mg | ORAL_TABLET | Freq: Four times a day (QID) | ORAL | Status: DC | PRN

## 2018-04-24 NOTE — ED Notes (Signed)
Wife called and wanted to be updated on pt. Wife states pt has been confused like his ammonia is up.

## 2018-04-24 NOTE — ED Notes (Signed)
Pt with unsteady gait requiring assistance of two personnel.

## 2018-04-24 NOTE — ED Triage Notes (Signed)
Pt called ems for not feeling well.  States he hurts all over.  Pt answers most questions "I don't know".  EMS reports DNR for liver problems.  Pt is disoriented to time, but otherwise oriented.

## 2018-04-24 NOTE — ED Notes (Signed)
Patient transported to CT 

## 2018-04-24 NOTE — ED Notes (Signed)
Vital signs stable. 

## 2018-04-24 NOTE — ED Provider Notes (Signed)
[facility] EMERGENCY DEPARTMENT Provider Note   CSN: [id number] Arrival date & time: 04/24/18  1642     History   Chief Complaint Chief Complaint  Patient presents with  . Weakness    HPI [name] [name] is a 57 y.o. male.  The history is provided by the patient, the EMS personnel and a caregiver. The history is limited by the condition of the patient (confusion).  Weakness   Pt was seen at 1920. Per EMS and family report: Family called EMS for pt "not feeling well." Pt's wife states pt has been confused "like his ammonia is up." Pt states he has not been taking his lactulose, and "takes it sometimes." Pt has hx non-compliance and multiple admissions for similar symptoms. Pt otherwise denies complaints. Denies CP/SOB, no abd pain, no N/V/D, no fevers, no focal motor weakness.     Past Medical History:  Diagnosis Date  . Anemia   . Ankle deformity, right    chronic  . Anxiety   . Arthritis   . Bipolar 1 disorder ([place])   . Blind right eye   . Blood clots in brain    per patient  . Cardiomegaly   . Cataract    Right eye  . Cerebrovascular disease    Right vertebral artery  . Cholelithiasis    Asymptomatic  . Chronic back pain   . Chronic pain   . Cirrhosis ([place])    suspected NASH  . Cirrhosis ([place])    diagnosed 07/2010 when presented with first episode of hepatic encephalopathy, afp on 416/13=4.8,  U/S on 12/14/11  liver stable, pt has received 2 hep A/B vaccines  . COPD (chronic obstructive pulmonary disease) ([place])   . Dementia   . Depression   . Diabetes mellitus   . Diverticulosis   . Dyspnea   . GERD (gastroesophageal reflux disease)   . Hard of hearing   . Hepatic encephalopathy ([place])   . Hepatitis   . Hyperlipidemia   . Hypertension   . Lymphedema    R>L  . Mental retardation   . Noncompliance   . Seizures ([place])   . Thrombocytopenia [facility])     Patient Active Problem List   Diagnosis Date Noted  . Chronic pain syndrome   . Cellulitis of right  lower extremity 02/17/2018  . Dysphagia 10/10/2017  . Encounter for screening colonoscopy 10/10/2017  . Palliative care encounter   . Goals of care, counseling/discussion   . DNR (do not resuscitate) discussion   . Encounter for hospice care discussion   . Altered mental status 09/11/2017  . Acute encephalopathy 12/29/2016  . Subarachnoid hemorrhage ([place]) 12/29/2016  . Bacteremia 12/29/2016  . Fall 12/21/2016  . Cerebral contusion ([place]) 12/21/2016  . Pressure injury of skin 12/14/2016  . Anxiety   . Suicidal ideation   . Acute hepatic encephalopathy ([place]) 08/02/2013  . Hypokalemia 02/01/2013  . Hyperammonemia ([place]) 01/31/2013  . Urinary incontinence 01/31/2013  . Pancytopenia ([place]) 09/17/2012  . Edema of right lower extremity 04/28/2012  . Hepatic encephalopathy ([place]) 04/28/2012  . Arthritis of ankle or foot, degenerative 04/26/2012  . Hematemesis 12/07/2011  . Thrombocytopenia ([place]) 12/07/2011  . Anemia 12/07/2011  . COPD (chronic obstructive pulmonary disease) ([place]) 08/25/2011  . DM (diabetes mellitus) ([place]) 08/25/2011  . HTN (hypertension) 08/25/2011  . Coagulopathy ([place]) 08/25/2011  . Cirrhosis ([place]) 08/25/2011  . Depression 08/25/2011  . Seizure disorder ([place]) 08/25/2011  . GERD (gastroesophageal reflux disease) 08/25/2011  . Tobacco abuse 08/25/2011  .  Ankle fracture, bimalleolar, closed 08/04/2011    Past Surgical History:  Procedure Laterality Date  . CAST APPLICATION  10/22/5174   Procedure: MINOR CAST APPLICATION;  Surgeon: [name], MD;  Location: [facility] ORS;  Service: Orthopedics;  Laterality: Right;  no anesthesia , procedure room do not need or room !  . COLONOSCOPY  12/15/11   colonic divericulosis;poor prep, ACBE  recommended but has not been done  . COLONOSCOPY WITH PROPOFOL N/A 11/07/2017   Dr. Gala Romney: inadequate prep, internal hemorrhoids. Early interval due March 2020 due to poor prep  . ear operations    . ESOPHAGOGASTRODUODENOSCOPY  02/02/2011    Rourk-Normal esophagus.  No varices/Question mild portal gastropathy, extrinsic compression on the antrum, lesser curvature of uncertain significance, some minimally  nodular mucosa status post biopsy, patent pylorus, normal duodenum 1 and duodenum 2.  . ESOPHAGOGASTRODUODENOSCOPY  12/15/11   Rourk-->mild changes of portal gastropathy, gastric/duodenal erosions s/p bx  (reactive changes with mild chronic inflammation. No H.pylori  . ESOPHAGOGASTRODUODENOSCOPY (EGD) WITH PROPOFOL N/A 11/07/2017   Dr. Gala Romney: normal esophagus s/p dilation, portal hypertensive gastropathy, erosive gastropathy, normal duodenum  . FOOT SURGERY    . MALONEY DILATION N/A 11/07/2017   Procedure: Venia Minks DILATION;  Surgeon: Daneil Dolin, MD;  Location: [facility] ENDO SUITE;  Service: Endoscopy;  Laterality: N/A;  . ORIF ANKLE FRACTURE  08/27/2011   Procedure: OPEN REDUCTION INTERNAL FIXATION (ORIF) ANKLE FRACTURE;  Surgeon: [name], MD;  Location: [facility] ORS;  Service: Orthopedics;  Laterality: Right;  . TONSILLECTOMY          Home Medications    Prior to Admission medications   Medication Sig Start Date End Date Taking? Authorizing Provider  albuterol (PROVENTIL HFA;VENTOLIN HFA) 108 (90 Base) MCG/ACT inhaler Inhale 2 puffs into the lungs every 4 (four) hours as needed for wheezing or shortness of breath.    [provider]  albuterol (PROVENTIL) (2.5 MG/3ML) 0.083% nebulizer solution Take 2.5 mg by nebulization every 6 (six) hours as needed for wheezing or shortness of breath.    [provider]  ALPRAZolam Duanne Moron) 0.5 MG tablet Take 1 tablet by mouth 3 (three) times daily as needed. 01/23/18   [provider]  aspirin EC 81 MG tablet Take 81 mg by mouth daily.    [provider]  cholecalciferol (VITAMIN D) 1000 units tablet Take 2,000 Units by mouth daily.     [provider]  ezetimibe (ZETIA) 10 MG tablet Take 10 mg by mouth daily.    [provider]  furosemide  (LASIX) 20 MG tablet Take 20 mg by mouth daily.    [provider]  lactulose (CHRONULAC) 10 GM/15ML solution Take 60 mLs (40 g total) by mouth 3 (three) times daily. Titrate for 3-4 bowel motions daily. Patient taking differently: Take 20 g by mouth 3 (three) times daily. Titrate for 3-4 bowel motions daily. 12/15/16   Elgergawy, Silver Huguenin, MD  Magnesium 250 MG TABS Take 1 tablet by mouth daily.    [provider]  metoprolol tartrate (LOPRESSOR) 50 MG tablet Take 50 mg by mouth 3 (three) times daily.     [provider]  Multiple Vitamin (MULTIVITAMIN WITH MINERALS) TABS Take 1 tablet by mouth daily.    [provider]  Omega-3 Fatty Acids (FISH OIL) 1000 MG CAPS Take 1 capsule by mouth daily.    [provider]  oxyCODONE (OXY IR/ROXICODONE) 5 MG immediate release tablet Take 1 tablet by mouth 3 (three) times daily.  10/04/17   [provider]  pantoprazole (PROTONIX) 40 MG tablet Take 40 mg by mouth daily.    [provider]  potassium chloride SA (K-DUR,KLOR-CON) 20 MEQ tablet Take 20 mEq by mouth daily.     [provider]  QUEtiapine (SEROQUEL) 25 MG tablet Take 1 tablet (25 mg total) by mouth at bedtime. Patient taking differently: Take 50 mg by mouth at bedtime.  12/31/16   Samuella Cota, MD  rifaximin (XIFAXAN) 550 MG TABS tablet Take 1 tablet (550 mg total) by mouth 2 (two) times daily. 12/31/16   Samuella Cota, MD  saccharomyces boulardii (FLORASTOR) 250 MG capsule Take 1 capsule (250 mg total) by mouth 2 (two) times daily. 02/18/18   Barton Dubois, MD  silver sulfADIAZINE (SILVADENE) 1 % cream Apply 1 application topically daily. As directed 01/06/18   [provider]  umeclidinium-vilanterol (ANORO ELLIPTA) 62.5-25 MCG/INH AEPB Inhale 1 puff into the lungs daily. 10/06/17   [provider]  vitamin B-12 (CYANOCOBALAMIN) 1000 MCG tablet Take 1,000 mcg by mouth daily.    [provider]      Family History Family History  Problem Relation Age of Onset  . Heart failure Mother   . Thyroid disease Mother   . Cancer Father   . Asthma Brother   . Heart attack Brother   . Cirrhosis Brother        EtOH  . Colon cancer Maternal Aunt     Social History Social History   Tobacco Use  . Smoking status: Current Every Day Smoker    Packs/day: 1.00    Years: 30.00    Pack years: 30.00    Types: Cigarettes  . Smokeless tobacco: Never Used  . Tobacco comment: only smokes about 1-2 a day  Substance Use Topics  . Alcohol use: No  . Drug use: No     Allergies   Penicillins   Review of Systems Review of Systems  Unable to perform ROS: Mental status change  Neurological: Positive for weakness.     Physical Exam Updated Vital Signs BP (!) 152/98 (BP Location: Left Arm)   Pulse (!) 52   Temp 98.4 F (36.9 C) (Oral)   Resp 14   Wt 96.6 kg   SpO2 100%   BMI 33.36 kg/m    BP (!) 152/98 (BP Location: Left Arm)   Pulse (!) 51   Temp 98.4 F (36.9 C) (Oral)   Resp 13   Wt 96.6 kg   SpO2 99%   BMI 33.36 kg/m    Physical Exam 1927: Physical examination:  Nursing notes reviewed; Vital signs and O2 SAT reviewed;  Constitutional: Well developed, Well nourished, Well hydrated, In no acute distress; Head:  Normocephalic, atraumatic; Eyes: Chronic right eye deformity. No scleral icterus; ENMT: Mouth and pharynx normal, Mucous membranes moist; Neck: Supple, Full range of motion, No lymphadenopathy; Cardiovascular: Regular rate and rhythm, No gallop; Respiratory: Breath sounds clear & equal bilaterally, No wheezes.  Speaking full sentences with ease, Normal respiratory effort/excursion; Chest: Nontender, Movement normal; Abdomen: Soft, Nontender, Nondistended, Normal bowel sounds; Genitourinary: No CVA tenderness; Extremities: Peripheral pulses normal, +chronic right ankle deformity. No tenderness, +1 pedal edema bilat R>L, per baseline. +mild erythema right anterior  tibial area. No open wounds. No calf tenderness.; Neuro: Awake, alert, confused re: times, events. Lethargic at times, but awakens to name. No facial droop. Speech rambling, slurred. Grips equal. Pt moves all extremities spontaneously and to command without apparent gross focal  motor deficits.; Skin: Color normal, Warm, Dry.   ED Treatments / Results  Labs (all labs ordered are listed, but only abnormal results are displayed)   EKG EKG Interpretation  Date/Time:  Monday April 24 2018 19:00:35 EDT Ventricular Rate:  52 PR Interval:    QRS Duration: 102 QT Interval:  494 QTC Calculation: 460 R Axis:   41 Text Interpretation:  Sinus rhythm Baseline wander When compared with ECG of 02/17/2018 No significant change was found Confirmed by Francine Graven [phone]) on 04/24/2018 8:08:35 PM   Radiology   Procedures Procedures (including critical care time)  Medications Ordered in ED Medications - No data to display   Initial Impression / Assessment and Plan / ED Course  I have reviewed the triage vital signs and the nursing notes.  Pertinent labs & imaging results that were available during my care of the patient were reviewed by me and considered in my medical decision making (see chart for details).  MDM Reviewed: previous chart, nursing note and vitals Reviewed previous: labs and ECG Interpretation: labs, ECG, x-ray and CT scan   Results for orders placed or performed during the hospital encounter of 04/24/18  CBC with Differential  Result Value Ref Range   WBC 4.0 4.0 - 10.5 K/uL   RBC 4.06 (L) 4.22 - 5.81 MIL/uL   Hemoglobin 14.3 13.0 - 17.0 g/dL   HCT 42.0 39.0 - 52.0 %   MCV 103.4 (H) 78.0 - 100.0 fL   MCH 35.2 (H) 26.0 - 34.0 pg   MCHC 34.0 30.0 - 36.0 g/dL   RDW 15.8 (H) 11.5 - 15.5 %   Platelets 66 (L) 150 - 400 K/uL   Neutrophils Relative % 51 %   Neutro Abs 2.1 1.7 - 7.7 K/uL   Lymphocytes Relative 31 %   Lymphs Abs 1.2 0.7 - 4.0 K/uL   Monocytes  Relative 11 %   Monocytes Absolute 0.4 0.1 - 1.0 K/uL   Eosinophils Relative 6 %   Eosinophils Absolute 0.3 0.0 - 0.7 K/uL   Basophils Relative 1 %   Basophils Absolute 0.0 0.0 - 0.1 K/uL  Comprehensive metabolic panel  Result Value Ref Range   Sodium 142 135 - 145 mmol/L   Potassium 4.0 3.5 - 5.1 mmol/L   Chloride 110 98 - 111 mmol/L   CO2 26 22 - 32 mmol/L   Glucose, Bld 98 70 - 99 mg/dL   BUN 7 6 - 20 mg/dL   Creatinine, Ser 0.73 0.61 - 1.24 mg/dL   Calcium 9.3 8.9 - 10.3 mg/dL   Total Protein 7.0 6.5 - 8.1 g/dL   Albumin 3.4 (L) 3.5 - 5.0 g/dL   AST 67 (H) 15 - 41 U/L   ALT 31 0 - 44 U/L   Alkaline Phosphatase 113 38 - 126 U/L   Total Bilirubin 2.9 (H) 0.3 - 1.2 mg/dL   GFR calc non Af Amer >60 >60 mL/min   GFR calc Af Amer >60 >60 mL/min   Anion gap 6 5 - 15  Troponin I  Result Value Ref Range   Troponin I <0.03 <0.03 ng/mL  Ammonia  Result Value Ref Range   Ammonia 69 (H) 9 - 35 umol/L  Urinalysis, Routine w reflex microscopic  Result Value Ref Range   Color, Urine YELLOW YELLOW   APPearance CLEAR CLEAR   Specific Gravity, Urine 1.013 1.005 - 1.030   pH 7.0 5.0 - 8.0   Glucose, UA NEGATIVE NEGATIVE mg/dL   Hgb urine  dipstick NEGATIVE NEGATIVE   Bilirubin Urine NEGATIVE NEGATIVE   Ketones, ur NEGATIVE NEGATIVE mg/dL   Protein, ur NEGATIVE NEGATIVE mg/dL   Nitrite NEGATIVE NEGATIVE   Leukocytes, UA NEGATIVE NEGATIVE  Glucose, capillary  Result Value Ref Range   Glucose-Capillary 80 70 - 99 mg/dL  Urine rapid drug screen (hosp performed)  Result Value Ref Range   Opiates NONE DETECTED NONE DETECTED   Cocaine NONE DETECTED NONE DETECTED   Benzodiazepines POSITIVE (A) NONE DETECTED   Amphetamines NONE DETECTED NONE DETECTED   Tetrahydrocannabinol NONE DETECTED NONE DETECTED   Barbiturates NONE DETECTED NONE DETECTED  Protime-INR  Result Value Ref Range   Prothrombin Time 16.6 (H) 11.4 - 15.2 seconds   INR 1.35   Brain natriuretic peptide  Result Value Ref  Range   B Natriuretic Peptide 142.0 (H) 0.0 - 100.0 pg/mL   Dg Chest 2 View Result Date: 04/24/2018 CLINICAL DATA:  COPD.  Confusion EXAM: CHEST - 2 VIEW FINDINGS: Stable large cardiac silhouette. There is fine bilateral airspace disease. No focal consolidation. No pneumothorax. No pleural fluid IMPRESSION: Bilateral airspace disease suggests pulmonary edema. Diffuse pulmonary infection could have a similar pattern but less favored. Electronically Signed   By: Suzy Bouchard M.D.   On: 04/24/2018 17:26   Ct Head Wo Contrast Result Date: 04/24/2018 CLINICAL DATA:  Encephalopathy confusion EXAM: CT HEAD WITHOUT CONTRAST TECHNIQUE: Contiguous axial images were obtained from the base of the skull through the vertex without intravenous contrast. COMPARISON:  02/17/2018 FINDINGS: Brain: No acute territorial infarction, hemorrhage or intracranial mass. The ventricles are nonenlarged. Mild atrophy. Vascular: No hyperdense vessels.  Carotid vascular calcification Skull: No fracture Sinuses/Orbits: Chronic right globe deformity. No acute orbital abnormality. Clear paranasal sinuses Other: None IMPRESSION: 1. No CT evidence for acute intracranial abnormality. 2. Atrophy Electronically Signed   By: Donavan Foil M.D.   On: 04/24/2018 19:59    2045:  Pt unable to stand without significant assist from ED staff. Ammonia not elevated from previous, but pt exhibiting similar symptoms to previous admissions when his ammonia was elevated. Pt endorse spotty compliance with his lactulose. Will observation admit. T/C returned from [facility] Dr. Olevia Bowens, case discussed, including:  HPI, pertinent PM/SHx, VS/PE, dx testing, ED course and treatment:  Agreeable to admit.    Final Clinical Impressions(s) / ED Diagnoses   Final diagnoses:  None    ED Discharge Orders    None       Francine Graven, DO 04/25/18 2124

## 2018-04-24 NOTE — H&P (Signed)
4        History and Physical    NARAYAN SCULL ZYY:[id number] DOB: 13-Nov-1960 DOA: 04/24/2018  PCP: Hermine Messick, MD   Patient coming from: Home.  I have personally briefly reviewed patient's old medical records in [place]  Chief Complaint: AMS.  HPI: [name] [name] is a 57 y.o. male with medical history significant of anemia, right ankle deformity, anxiety, osteoarthritis, bipolar 1 disorder, right eye cataract and blindness, chronic systolic heart failure, cerebrovascular disease,  chronic back pain, COPD, dementia, depression, type 2 diabetes, diverticulosis, GERD, impaired hearing hyperlipidemia, hypertension, bilateral lymphedema, mental retardation, noncompliance of medication, seizure disorder, thrombocytopenia, cholelithiasis, history of hepatitis, history of hepatic encephalopathy, liver cirrhosis who was brought to the emergency department due to [place].  He is unable to provide further information.  ED Course: Initial vital signs temperature 98.4 F, pulse 51, respirations 20, blood pressure 152/98 mmHg and O2 sat 98% on room air.  His urinalysis was normal.  UDS was positive for benzodiazepines.  CBC showed a white count was 4.0 with a normal differential, hemoglobin of 14.3 g/dL and platelets of 66.  PT 16.6 seconds and INR 1.35.  Troponin was less than 0.03 ng/mL.  BNP was 142.0 pg/mL.  CMP showed an albumin of 3.4 g/dL, AST of 67 units/L and total bilirubin of 2.9 mg/dL.  All other values were normal.  Ammonia level was 69 mol/L.  Imaging: Chest radiograph shows bilateral airspace disease compatible with pulmonary edema versus less likely infection.  CT head showed atrophy, but no acute intracranial pathology.  Please see images and full radiology report for further detail.  Review of Systems:  Unable to obtain.   Past Medical History:  Diagnosis Date  . Anemia   . Ankle deformity, right    chronic  . Anxiety   . Arthritis   . Bipolar 1 disorder ([place])   .  Blind right eye   . Blood clots in brain    per patient  . Cardiomegaly   . Cataract    Right eye  . Cerebrovascular disease    Right vertebral artery  . Cholelithiasis    Asymptomatic  . Chronic back pain   . Chronic pain   . Cirrhosis ([place])    suspected NASH  . Cirrhosis ([place])    diagnosed 07/2010 when presented with first episode of hepatic encephalopathy, afp on 416/13=4.8,  U/S on 12/14/11  liver stable, pt has received 2 hep A/B vaccines  . COPD (chronic obstructive pulmonary disease) ([place])   . Dementia   . Depression   . Diabetes mellitus   . Diverticulosis   . Dyspnea   . GERD (gastroesophageal reflux disease)   . Hard of hearing   . Hepatic encephalopathy ([place])   . Hepatitis   . Hyperlipidemia   . Hypertension   . Lymphedema    R>L  . Mental retardation   . Noncompliance   . Seizures ([place])   . Thrombocytopenia ([place])     Past Surgical History:  Procedure Laterality Date  . CAST APPLICATION  11/28/8889   Procedure: MINOR CAST APPLICATION;  Surgeon: [name], MD;  Location: [facility];  Service: Orthopedics;  Laterality: Right;  no anesthesia , procedure room do not need or room !  . COLONOSCOPY  12/15/11   colonic divericulosis;poor prep, ACBE  recommended but has not been done  . COLONOSCOPY WITH PROPOFOL N/A 11/07/2017   Dr. Gala Romney: inadequate prep, internal hemorrhoids. Early interval due March 2020  due to poor prep  . ear operations    . ESOPHAGOGASTRODUODENOSCOPY  02/02/2011   [place]ourk-Normal esophagus.  No varices/Question mild portal gastropathy, extrinsic compression on the antrum, lesser curvature of uncertain significance, some minimally  nodular mucosa status post biopsy, patent pylorus, normal duodenum 1 and duodenum 2.  . ESOPHAGOGASTRODUODENOSCOPY  12/15/11   [place]ourk-->mild changes of portal gastropathy, gastric/duodenal erosions s/p bx  (reactive changes with mild chronic inflammation. No H.pylori  . ESOPHAGOGASTRODUODENOSCOPY (EGD) WITH PROPOFOL N/A  11/07/2017   Dr. Gala Romney: normal esophagus s/p dilation, portal hypertensive gastropathy, erosive gastropathy, normal duodenum  . FOOT SURGERY    . MALONEY DILATION N/A 11/07/2017   Procedure: Venia Minks DILATION;  Surgeon: Daneil Dolin, MD;  Location: [facility] SUITE;  Service: Endoscopy;  Laterality: N/A;  . ORIF ANKLE FRACTURE  08/27/2011   Procedure: OPEN REDUCTION INTERNAL FIXATION (ORIF) ANKLE FRACTURE;  Surgeon: [name], MD;  Location: [facility];  Service: Orthopedics;  Laterality: Right;  . TONSILLECTOMY       reports that he has been smoking cigarettes. He has a 30.00 pack-year smoking history. He has never used smokeless tobacco. He reports that he does not drink alcohol or use drugs.  Allergies  Allergen Reactions  . Penicillins Rash    Has patient had a PCN reaction causing immediate rash, facial/tongue/throat swelling, SOB or lightheadedness with hypotension: Yes Has patient had a PCN reaction causing severe rash involving mucus membranes or skin necrosis: No Has patient had a PCN reaction that required hospitalization No Has patient had a PCN reaction occurring within the last 10 years: No If all of the above answers are "NO", then may proceed with Cephalosporin use.     Family History  Problem Relation Age of Onset  . Heart failure Mother   . Thyroid disease Mother   . Cancer Father   . Asthma Brother   . Heart attack Brother   . Cirrhosis Brother        [place]  . Colon cancer Maternal Aunt     Prior to Admission medications   Medication Sig Start Date End Date Taking? Authorizing Provider  albuterol (PROVENTIL HFA;VENTOLIN HFA) 108 (90 Base) MCG/ACT inhaler Inhale 2 puffs into the lungs every 4 (four) hours as needed for wheezing or shortness of breath.    [provider]  albuterol (PROVENTIL) (2.5 MG/3ML) 0.083% nebulizer solution Take 2.5 mg by nebulization every 6 (six) hours as needed for wheezing or shortness of breath.    [provider]    ALPRAZolam Duanne Moron) 0.5 MG tablet Take 1 tablet by mouth 3 (three) times daily as needed. 01/23/18   [provider]  aspirin EC 81 MG tablet Take 81 mg by mouth daily.    [provider]  cholecalciferol (VITAMIN D) 1000 units tablet Take 2,000 Units by mouth daily.     [provider]  ezetimibe (ZETIA) 10 MG tablet Take 10 mg by mouth daily.    [provider]  furosemide (LASIX) 20 MG tablet Take 20 mg by mouth daily.    [provider]  lactulose (CHRONULAC) 10 GM/15ML solution Take 60 mLs (40 g total) by mouth 3 (three) times daily. Titrate for 3-4 bowel motions daily. Patient taking differently: Take 20 g by mouth 3 (three) times daily. Titrate for 3-4 bowel motions daily. 12/15/16   Elgergawy, Silver Huguenin, MD  Magnesium 250 MG TABS Take 1 tablet by mouth daily.    [provider]  metoprolol tartrate (LOPRESSOR) 50  MG tablet Take 50 mg by mouth 3 (three) times daily.     [provider]  Multiple Vitamin (MULTIVITAMIN WITH MINERALS) TABS Take 1 tablet by mouth daily.    [provider]  Omega-3 Fatty Acids (FISH OIL) 1000 MG CAPS Take 1 capsule by mouth daily.    [provider]  oxyCODONE (OXY IR/ROXICODONE) 5 MG immediate release tablet Take 1 tablet by mouth 3 (three) times daily.  10/04/17   [provider]  pantoprazole (PROTONIX) 40 MG tablet Take 40 mg by mouth daily.    [provider]  potassium chloride SA (K-DUR,KLOR-CON) 20 MEQ tablet Take 20 mEq by mouth daily.     [provider]  QUEtiapine (SEROQUEL) 25 MG tablet Take 1 tablet (25 mg total) by mouth at bedtime. Patient taking differently: Take 50 mg by mouth at bedtime.  12/31/16   Samuella Cota, MD  rifaximin (XIFAXAN) 550 MG TABS tablet Take 1 tablet (550 mg total) by mouth 2 (two) times daily. 12/31/16   Samuella Cota, MD  saccharomyces boulardii (FLORASTOR) 250 MG capsule Take 1 capsule (250 mg total) by mouth 2  (two) times daily. 02/18/18   Barton Dubois, MD  silver sulfADIAZINE (SILVADENE) 1 % cream Apply 1 application topically daily. As directed 01/06/18   [provider]  umeclidinium-vilanterol (ANORO ELLIPTA) 62.5-25 MCG/INH AEPB Inhale 1 puff into the lungs daily. 10/06/17   [provider]  vitamin B-12 (CYANOCOBALAMIN) 1000 MCG tablet Take 1,000 mcg by mouth daily.    [provider]    Physical Exam: Vitals:   04/24/18 1900 04/24/18 1945 04/24/18 2000 04/24/18 2010  BP:    (!) 184/81  Pulse: (!) 52 (!) 54 (!) 51 (!) 51  Resp: 14 14 13 13   Temp:      TempSrc:      SpO2: 100% 97% 99% 97%  Weight:        Constitutional: NAD, calm, comfortable Eyes: PERRL, lids and conjunctivae normal.  Positive scleral icterus. ENMT: Mucous membranes are mildly dry. Posterior pharynx clear of any exudate or lesions. Neck: normal, supple, no masses, no thyromegaly.  Cervical area skin shows erythema.  See pictures below. Respiratory: Decreased breath sounds on bases, but otherwise clear to auscultation bilaterally, no wheezing, no crackles. Normal respiratory effort. No accessory muscle use.  Cardiovascular: Regular rate and rhythm, no murmurs / rubs / gallops. No extremity edema. 2+ pedal pulses. No carotid bruits.  Abdomen: Obese, soft, no tenderness, no masses palpated. No hepatosplenomegaly. Bowel sounds positive.  Musculoskeletal: no clubbing / cyanosis. Good passive ROM, no contractures.  Decreased muscle tone.  Skin: Upper extremities show erythema over hypopigmented and hyperpigmented macules..  See pictures below. Neurologic: Somnolent.  Unable to fully evaluate. Psychiatric: Somnolent.  Wakes up with tactile stimuli.  May answer simple question and then go back to sleep within 30-45 seconds.             Labs on Admission: I have personally reviewed following labs and imaging studies  CBC: Recent Labs  Lab 04/24/18 1835  WBC 4.0  NEUTROABS 2.1  HGB 14.3   HCT 42.0  MCV 103.4*  PLT 66*   Basic Metabolic Panel: Recent Labs  Lab 04/24/18 1835  NA 142  K 4.0  CL 110  CO2 26  GLUCOSE 98  BUN 7  CREATININE 0.73  CALCIUM 9.3  MG 2.2   GFR: Estimated Creatinine Clearance: 111.5 mL/min (by C-G formula based on SCr of 0.73  mg/dL). Liver Function Tests: Recent Labs  Lab 04/24/18 1835  AST 67*  ALT 31  ALKPHOS 113  BILITOT 2.9*  PROT 7.0  ALBUMIN 3.4*   No results for input(s): LIPASE, AMYLASE in the last 168 hours. Recent Labs  Lab 04/24/18 1835  AMMONIA 69*   Coagulation Profile: Recent Labs  Lab 04/24/18 1835  INR 1.35   Cardiac Enzymes: Recent Labs  Lab 04/24/18 1835  TROPONINI <0.03   BNP (last 3 results) No results for input(s): PROBNP in the last 8760 hours. HbA1C: No results for input(s): HGBA1C in the last 72 hours. CBG: Recent Labs  Lab 04/24/18 1839  GLUCAP 80   Lipid Profile: No results for input(s): CHOL, HDL, LDLCALC, TRIG, CHOLHDL, LDLDIRECT in the last 72 hours. Thyroid Function Tests: No results for input(s): TSH, T4TOTAL, FREET4, T3FREE, THYROIDAB in the last 72 hours. Anemia Panel: No results for input(s): VITAMINB12, FOLATE, FERRITIN, TIBC, IRON, RETICCTPCT in the last 72 hours. Urine analysis:    Component Value Date/Time   COLORURINE YELLOW 04/24/2018 1920   APPEARANCEUR CLEAR 04/24/2018 1920   LABSPEC 1.013 04/24/2018 1920   PHURINE 7.0 04/24/2018 1920   GLUCOSEU NEGATIVE 04/24/2018 1920   HGBUR NEGATIVE 04/24/2018 1920   BILIRUBINUR NEGATIVE 04/24/2018 1920   KETONESUR NEGATIVE 04/24/2018 1920   PROTEINUR NEGATIVE 04/24/2018 1920   UROBILINOGEN 1.0 01/03/2014 1748   NITRITE NEGATIVE 04/24/2018 1920   LEUKOCYTESUR NEGATIVE 04/24/2018 1920    Radiological Exams on Admission: Dg Chest 2 View  Result Date: 04/24/2018 CLINICAL DATA:  COPD.  Confusion EXAM: CHEST - 2 VIEW FINDINGS: Stable large cardiac silhouette. There is fine bilateral airspace disease. No focal  consolidation. No pneumothorax. No pleural fluid IMPRESSION: Bilateral airspace disease suggests pulmonary edema. Diffuse pulmonary infection could have a similar pattern but less favored. Electronically Signed   By: Suzy Bouchard M.D.   On: 04/24/2018 17:26   Ct Head Wo Contrast  Result Date: 04/24/2018 CLINICAL DATA:  Encephalopathy confusion EXAM: CT HEAD WITHOUT CONTRAST TECHNIQUE: Contiguous axial images were obtained from the base of the skull through the vertex without intravenous contrast. COMPARISON:  02/17/2018 FINDINGS: Brain: No acute territorial infarction, hemorrhage or intracranial mass. The ventricles are nonenlarged. Mild atrophy. Vascular: No hyperdense vessels.  Carotid vascular calcification Skull: No fracture Sinuses/Orbits: Chronic right globe deformity. No acute orbital abnormality. Clear paranasal sinuses Other: None IMPRESSION: 1. No CT evidence for acute intracranial abnormality. 2. Atrophy Electronically Signed   By: Donavan Foil M.D.   On: 04/24/2018 19:59    EKG: Independently reviewed. Vent. rate 52 BPM PR interval * ms QRS duration 102 ms QT/QTc 494/460 ms P-R-T axes 21 41 60 Sinus rhythm Baseline wander No significant change from previous.  Assessment/Plan Principal Problem:   Altered mental status Observation/telemetry. Keep n.p.o. while somnolent. Continue supplemental oxygen. Hold oral medications for now. Treat hyperammonemia with lactulose enemas.  Active Problems:   Hepatic encephalopathy (HCC)   Hyperammonemia (HCC) Lactulose enema ordered. Follow-up ammonia level.    Cirrhosis ([place]) Resume oral furosemide, Xifaxan to low-dose once clear for oral intake.    COPD (chronic obstructive pulmonary disease) (HCC) Continue supplemental oxygen. Bronchodilators as needed.    HTN (hypertension) Hold furosemide and metoprolol. Monitor blood pressure and heart rate.    Seizure disorder [facility]) Not on antiepileptic medications.    GERD  (gastroesophageal reflux disease) Famotidine IV while altered.    Thrombocytopenia ([place]) Secondary to liver cirrhosis. Monitor platelet level.     DVT prophylaxis: SCDs. Code Status:  Full code. Family Communication: None at bedside. Disposition Plan: Observation for hyperammonemia treatment. Consults called:  Admission status: Observation/telemetry.   Reubin Milan MD [facility] Pager [phone]  If 7PM-7AM, please contact night-coverage [email or web address] Password [email or web address]  04/24/2018, 10:00 PM

## 2018-04-25 DIAGNOSIS — I10 Essential (primary) hypertension: Secondary | ICD-10-CM | POA: Diagnosis not present

## 2018-04-25 DIAGNOSIS — J449 Chronic obstructive pulmonary disease, unspecified: Secondary | ICD-10-CM | POA: Diagnosis not present

## 2018-04-25 DIAGNOSIS — R400 Somnolence: Secondary | ICD-10-CM | POA: Diagnosis not present

## 2018-04-25 DIAGNOSIS — D696 Thrombocytopenia, unspecified: Secondary | ICD-10-CM | POA: Diagnosis not present

## 2018-04-25 DIAGNOSIS — G40909 Epilepsy, unspecified, not intractable, without status epilepticus: Secondary | ICD-10-CM | POA: Diagnosis not present

## 2018-04-25 DIAGNOSIS — E7220 Disorder of urea cycle metabolism, unspecified: Secondary | ICD-10-CM | POA: Diagnosis not present

## 2018-04-25 LAB — COMPREHENSIVE METABOLIC PANEL
ALBUMIN: 2.8 g/dL — AB (ref 3.5–5.0)
ALK PHOS: 83 U/L (ref 38–126)
ALT: 26 U/L (ref 0–44)
ANION GAP: 8 (ref 5–15)
AST: 51 U/L — ABNORMAL HIGH (ref 15–41)
BILIRUBIN TOTAL: 2.9 mg/dL — AB (ref 0.3–1.2)
BUN: 8 mg/dL (ref 6–20)
CO2: 23 mmol/L (ref 22–32)
Calcium: 8.6 mg/dL — ABNORMAL LOW (ref 8.9–10.3)
Chloride: 111 mmol/L (ref 98–111)
Creatinine, Ser: 0.6 mg/dL — ABNORMAL LOW (ref 0.61–1.24)
GFR calc Af Amer: >60 mL/min (ref >60)
GLUCOSE: 101 mg/dL — AB (ref 70–99)
Potassium: 3.5 mmol/L (ref 3.5–5.1)
Sodium: 142 mmol/L (ref 135–145)
Total Protein: 5.8 g/dL — ABNORMAL LOW (ref 6.5–8.1)

## 2018-04-25 LAB — MRSA PCR SCREENING: MRSA by PCR: NEGATIVE

## 2018-04-25 LAB — CBC WITH DIFFERENTIAL/PLATELET
BASOS PCT: 1 %
Basophils Absolute: 0 10*3/uL (ref 0.0–0.1)
Eosinophils Absolute: 0.2 10*3/uL (ref 0.0–0.7)
Eosinophils Relative: 6 %
HEMATOCRIT: 39.5 % (ref 39.0–52.0)
HEMOGLOBIN: 13.2 g/dL (ref 13.0–17.0)
LYMPHS ABS: 1.2 10*3/uL (ref 0.7–4.0)
LYMPHS PCT: 33 %
MCH: 34.6 pg — ABNORMAL HIGH (ref 26.0–34.0)
MCHC: 33.4 g/dL (ref 30.0–36.0)
MCV: 103.7 fL — ABNORMAL HIGH (ref 78.0–100.0)
MONO ABS: 0.4 10*3/uL (ref 0.1–1.0)
MONOS PCT: 11 %
NEUTROS ABS: 1.8 10*3/uL (ref 1.7–7.7)
NEUTROS PCT: 50 %
Platelets: 59 10*3/uL — ABNORMAL LOW (ref 150–400)
RBC: 3.81 MIL/uL — ABNORMAL LOW (ref 4.22–5.81)
RDW: 15.3 % (ref 11.5–15.5)
WBC: 3.5 10*3/uL — ABNORMAL LOW (ref 4.0–10.5)

## 2018-04-25 LAB — GLUCOSE, CAPILLARY
GLUCOSE-CAPILLARY: 83 mg/dL (ref 70–99)
GLUCOSE-CAPILLARY: 94 mg/dL (ref 70–99)
GLUCOSE-CAPILLARY: 107 mg/dL — AB (ref 70–99)
Glucose-Capillary: 70 mg/dL (ref 70–99)
Glucose-Capillary: 86 mg/dL (ref 70–99)
Glucose-Capillary: 86 mg/dL (ref 70–99)

## 2018-04-25 LAB — AMMONIA: Ammonia: 47 umol/L — ABNORMAL HIGH (ref 9–35)

## 2018-04-25 MED ORDER — METOPROLOL TARTRATE 50 MG PO TABS
50.0000 mg | ORAL_TABLET | Freq: Three times a day (TID) | ORAL | Status: DC
  Administered 2018-04-25 – 2018-04-26 (×4): 50 mg via ORAL
  Filled 2018-04-25 (×4): qty 1

## 2018-04-25 MED ORDER — DEXTROSE 10 % IV SOLN
INTRAVENOUS | Status: DC
  Administered 2018-04-25: 02:00:00 via INTRAVENOUS

## 2018-04-25 MED ORDER — PANTOPRAZOLE SODIUM 40 MG PO TBEC
40.0000 mg | DELAYED_RELEASE_TABLET | Freq: Every day | ORAL | Status: DC
  Administered 2018-04-25 – 2018-04-26 (×2): 40 mg via ORAL
  Filled 2018-04-25 (×2): qty 1

## 2018-04-25 MED ORDER — RIFAXIMIN 550 MG PO TABS
550.0000 mg | ORAL_TABLET | Freq: Two times a day (BID) | ORAL | Status: DC
  Administered 2018-04-25 – 2018-04-26 (×3): 550 mg via ORAL
  Filled 2018-04-25 (×3): qty 1

## 2018-04-25 MED ORDER — TRAMADOL HCL 50 MG PO TABS
50.0000 mg | ORAL_TABLET | Freq: Once | ORAL | Status: AC
  Administered 2018-04-25: 50 mg via ORAL
  Filled 2018-04-25: qty 1

## 2018-04-25 MED ORDER — HYDRALAZINE HCL 20 MG/ML IJ SOLN
10.0000 mg | Freq: Four times a day (QID) | INTRAMUSCULAR | Status: DC | PRN
  Administered 2018-04-25: 10 mg via INTRAVENOUS
  Filled 2018-04-25: qty 1

## 2018-04-25 MED ORDER — EZETIMIBE 10 MG PO TABS
10.0000 mg | ORAL_TABLET | Freq: Every day | ORAL | Status: DC
  Administered 2018-04-25 – 2018-04-26 (×2): 10 mg via ORAL
  Filled 2018-04-25 (×2): qty 1

## 2018-04-25 MED ORDER — QUETIAPINE FUMARATE 25 MG PO TABS
25.0000 mg | ORAL_TABLET | Freq: Every day | ORAL | Status: DC
  Administered 2018-04-25: 25 mg via ORAL
  Filled 2018-04-25: qty 1

## 2018-04-25 MED ORDER — LACTULOSE ENEMA
300.0000 mL | Freq: Two times a day (BID) | ORAL | Status: DC
  Filled 2018-04-25 (×3): qty 300

## 2018-04-25 MED ORDER — LACTULOSE 10 GM/15ML PO SOLN
30.0000 g | Freq: Three times a day (TID) | ORAL | Status: DC
  Administered 2018-04-25 – 2018-04-26 (×4): 30 g via ORAL
  Filled 2018-04-25 (×6): qty 60

## 2018-04-25 MED ORDER — FUROSEMIDE 20 MG PO TABS
20.0000 mg | ORAL_TABLET | Freq: Every day | ORAL | Status: DC
  Administered 2018-04-25 – 2018-04-26 (×2): 20 mg via ORAL
  Filled 2018-04-25 (×2): qty 1

## 2018-04-25 NOTE — Care Management Obs Status (Signed)
[place] NOTIFICATION   Patient Details  Name: [name] [name] MRN: [id number] Date of Birth: [birth year]   Medicare Observation Status Notification Given:  Yes    Sherald Barge, RN 04/25/2018, 2:12 PM

## 2018-04-25 NOTE — Progress Notes (Signed)
Patient refused lactulose. Patient was educated on importance of taking prescribed medication. Patient verbalized understanding and still refused.

## 2018-04-25 NOTE — Progress Notes (Signed)
Pt currently has no IV access. MD made aware. States it's not urgent to get an IV due to IV fluids being D/C, but to start one at nursing staff's earliest convience just in case of emergency.

## 2018-04-25 NOTE — Progress Notes (Signed)
PROGRESS NOTE    [name] [name]  IOX:[id number] DOB: 1961/07/19 DOA: 04/24/2018 PCP: Hermine Messick, MD    Brief Narrative:  [name] [name] is a 57 y.o. male with medical history significant of anemia, right ankle deformity, anxiety, osteoarthritis, bipolar 1 disorder, right eye cataract and blindness, chronic systolic heart failure, cerebrovascular disease,  chronic back pain, COPD, dementia, depression, type 2 diabetes, diverticulosis, GERD, impaired hearing hyperlipidemia, hypertension, bilateral lymphedema, mental retardation, noncompliance of medication, seizure disorder, thrombocytopenia, cholelithiasis, history of hepatitis, history of hepatic encephalopathy, liver cirrhosis who was brought to the emergency department due to [place].   Chest radiograph shows bilateral airspace disease compatible with pulmonary edema versus less likely infection.  CT head showed atrophy, but no acute intracranial pathology.  Assessment & Plan:   Principal Problem:   Altered mental status Active Problems:   COPD (chronic obstructive pulmonary disease) (HCC)   HTN (hypertension)   Cirrhosis (HCC)   Seizure disorder (HCC)   GERD (gastroesophageal reflux disease)   Thrombocytopenia (HCC)   Hepatic encephalopathy (HCC)   Hyperammonemia (HCC)   Acute encephalopathy secondary to hepatic encephalopathy:  Improving with improving ammonia levels.  This am, he is alert and able to answer simple questions.  Resume oral lactulose.    Copd:  No wheezing heard.  Resume duonebs as needed.    Hypertensive crisis.  Uncontrolled , restart home meds.  Pt currently asymptomatic. And IV hydralazine ordered prn.    Seizure disorder:  Unclear etiology if he had an episode prior to admission. Currently he is alert and comfortable.    GERD; stable on protonix.    Chronic thrombocytopenia:  Platelets bet ween 50,000 to 60,000.  Monitor for signs of bleeding.    Liver cirrhosis with fluid overload.    Resume rifaximin, lactulose, lasix.  He is not in distress right now and in on RA.      DVT prophylaxis: SCD'S  Code Status: DNR.  Family Communication: none at bedside.  Disposition Plan: pending resolution of the hepatic encephalopathy and PT evaluation.    Consultants:   None.    Procedures: none.   Antimicrobials: rifaximin for liver cirrhosis.   Subjective: Pt requesting water and to eat . Oriented to person and place only.  Objective: Vitals:   04/24/18 2010 04/24/18 2225 04/25/18 0532 04/25/18 1352  BP: (!) 184/81 133/69 140/62 (!) 192/96  Pulse: (!) 51 (!) 51 (!) 55 73  Resp: 13 17 18 16   Temp:  97.8 F (36.6 C) 98.3 F (36.8 C) 98.3 F (36.8 C)  TempSrc:   Oral Oral  SpO2: 97% 96% 97% 99%  Weight:        Intake/Output Summary (Last 24 hours) at 04/25/2018 1408 Last data filed at 04/25/2018 1328 Gross per 24 hour  Intake 268.33 ml  Output 2 ml  Net 266.33 ml   Filed Weights   04/24/18 1648  Weight: 96.6 kg    Examination:  General exam: Appears calm and comfortable  Respiratory system: Clear to auscultation. Respiratory effort normal. Cardiovascular system: S1 & S2 heard, RRR. No JVD, murmurs,   Gastrointestinal system: Abdomen is soft mildly distended. Bowel sounds good.  Central nervous system: Alert and sitting, able to answer simple questions.  Extremities: Symmetric 5 x 5 power. Skin:  Erythematous macules over the neck and arms improving.  Psychiatry: calm and comfortable.    Data Reviewed: I have personally reviewed following labs and imaging studies  CBC: Recent Labs  Lab 04/24/18 1835 04/25/18 0559  WBC 4.0 3.5*  NEUTROABS 2.1 1.8  HGB 14.3 13.2  HCT 42.0 39.5  MCV 103.4* 103.7*  PLT 66* 59*   Basic Metabolic Panel: Recent Labs  Lab 04/24/18 1835 04/25/18 0559  NA 142 142  K 4.0 3.5  CL 110 111  CO2 26 23  GLUCOSE 98 101*  BUN 7 8  CREATININE 0.73 0.60*  CALCIUM 9.3 8.6*  MG 2.2  --    GFR: Estimated  Creatinine Clearance: 111.5 mL/min (A) (by C-G formula based on SCr of 0.6 mg/dL (L)). Liver Function Tests: Recent Labs  Lab 04/24/18 1835 04/25/18 0559  AST 67* 51*  ALT 31 26  ALKPHOS 113 83  BILITOT 2.9* 2.9*  PROT 7.0 5.8*  ALBUMIN 3.4* 2.8*   No results for input(s): LIPASE, AMYLASE in the last 168 hours. Recent Labs  Lab 04/24/18 1835 04/25/18 0559  AMMONIA 69* 47*   Coagulation Profile: Recent Labs  Lab 04/24/18 1835  INR 1.35   Cardiac Enzymes: Recent Labs  Lab 04/24/18 1835  TROPONINI <0.03   BNP (last 3 results) No results for input(s): PROBNP in the last 8760 hours. HbA1C: No results for input(s): HGBA1C in the last 72 hours. CBG: Recent Labs  Lab 04/24/18 1839 04/25/18 0019 04/25/18 0401 04/25/18 0748 04/25/18 1137  GLUCAP 80 70 83 94 107*   Lipid Profile: No results for input(s): CHOL, HDL, LDLCALC, TRIG, CHOLHDL, LDLDIRECT in the last 72 hours. Thyroid Function Tests: No results for input(s): TSH, T4TOTAL, FREET4, T3FREE, THYROIDAB in the last 72 hours. Anemia Panel: No results for input(s): VITAMINB12, FOLATE, FERRITIN, TIBC, IRON, RETICCTPCT in the last 72 hours. Sepsis Labs: No results for input(s): PROCALCITON, LATICACIDVEN in the last 168 hours.  Recent Results (from the past 240 hour(s))  MRSA PCR Screening     Status: None   Collection Time: 04/24/18 10:27 PM  Result Value Ref Range Status   MRSA by PCR NEGATIVE NEGATIVE Final    Comment:        The GeneXpert MRSA Assay (FDA approved for NASAL specimens only), is one component of a comprehensive MRSA colonization surveillance program. It is not intended to diagnose MRSA infection nor to guide or monitor treatment for MRSA infections. Performed at [facility], [address] [place], [place] [address]          Radiology Studies: Dg Chest 2 View  Result Date: 04/24/2018 CLINICAL DATA:  COPD.  Confusion EXAM: CHEST - 2 VIEW FINDINGS: Stable large cardiac silhouette.  There is fine bilateral airspace disease. No focal consolidation. No pneumothorax. No pleural fluid IMPRESSION: Bilateral airspace disease suggests pulmonary edema. Diffuse pulmonary infection could have a similar pattern but less favored. Electronically Signed   By: Suzy Bouchard M.D.   On: 04/24/2018 17:26   Ct Head Wo Contrast  Result Date: 04/24/2018 CLINICAL DATA:  Encephalopathy confusion EXAM: CT HEAD WITHOUT CONTRAST TECHNIQUE: Contiguous axial images were obtained from the base of the skull through the vertex without intravenous contrast. COMPARISON:  02/17/2018 FINDINGS: Brain: No acute territorial infarction, hemorrhage or intracranial mass. The ventricles are nonenlarged. Mild atrophy. Vascular: No hyperdense vessels.  Carotid vascular calcification Skull: No fracture Sinuses/Orbits: Chronic right globe deformity. No acute orbital abnormality. Clear paranasal sinuses Other: None IMPRESSION: 1. No CT evidence for acute intracranial abnormality. 2. Atrophy Electronically Signed   By: Donavan Foil M.D.   On: 04/24/2018 19:59        Scheduled Meds: . ezetimibe  10 mg Oral Daily  .  furosemide  20 mg Oral Daily  . lactulose  30 g Oral TID  . metoprolol tartrate  50 mg Oral TID  . pantoprazole  40 mg Oral Daily  . QUEtiapine  25 mg Oral QHS  . rifaximin  550 mg Oral BID   Continuous Infusions:   LOS: 0 days    Time spent: 35 minutes.     Hosie Poisson, MD [facility] Pager [phone]   If 7PM-7AM, please contact night-coverage [email or web address] Password [email or web address] 04/25/2018, 2:08 PM

## 2018-04-26 DIAGNOSIS — K219 Gastro-esophageal reflux disease without esophagitis: Secondary | ICD-10-CM

## 2018-04-26 DIAGNOSIS — K7290 Hepatic failure, unspecified without coma: Secondary | ICD-10-CM | POA: Diagnosis not present

## 2018-04-26 DIAGNOSIS — I10 Essential (primary) hypertension: Secondary | ICD-10-CM | POA: Diagnosis not present

## 2018-04-26 DIAGNOSIS — E7220 Disorder of urea cycle metabolism, unspecified: Secondary | ICD-10-CM | POA: Diagnosis not present

## 2018-04-26 DIAGNOSIS — D696 Thrombocytopenia, unspecified: Secondary | ICD-10-CM | POA: Diagnosis not present

## 2018-04-26 LAB — URINE CULTURE: Culture: NO GROWTH

## 2018-04-26 LAB — HIV ANTIBODY (ROUTINE TESTING W REFLEX): HIV Screen 4th Generation wRfx: NONREACTIVE

## 2018-04-26 LAB — GLUCOSE, CAPILLARY
GLUCOSE-CAPILLARY: 82 mg/dL (ref 70–99)
GLUCOSE-CAPILLARY: 100 mg/dL — AB (ref 70–99)

## 2018-04-26 NOTE — Discharge Planning (Addendum)
Patient IV removed. RN assessment and VS revealed stability to go home with [facility].  Discharge papers given, explained and educated.  Informed of suggested FU appts and no scripts needed at this time.  Once ready, will be walked to front and family transporting home via car.  Awaiting ride to arrive.

## 2018-04-26 NOTE — Evaluation (Signed)
Physical Therapy Evaluation Patient Details Name: [name] [name] MRN: [id number] DOB: 1961/04/08 Today's Date: 04/26/2018   History of Present Illness  57 yo male with onset of AMS and encephalopathy was admitted for pulm edema, fluid overload, hepatic encephalopathy.  Has been semi compliant with medications.  PMHx:  R ankle deformity, OA, bipolar disorder, seizures, CHF, R cataract with blindness, anemia, COPD, HOH  Clinical Impression  Pt was seen for evaluation of gait and safety and note he can return home with wife with HHPT and follow up with his PCP.  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  See acutely for same goals.    Follow Up Recommendations Home health PT    Equipment Recommendations  None recommended by PT    Recommendations for Other Services       Precautions / Restrictions Precautions Precautions: Fall Precaution Comments: uses SPC at home Restrictions Weight Bearing Restrictions: No      Mobility  Bed Mobility Overal bed mobility: Modified Independent                Transfers Overall transfer level: Modified independent Equipment used: 1 person hand held assist             General transfer comment: PT supervised for safety  Ambulation/Gait Ambulation/Gait assistance: Supervision;Min guard Gait Distance (Feet): 150 Feet Assistive device: 1 person hand held assist Gait Pattern/deviations: Step-through pattern;Decreased stride length;Narrow base of support;Drifts right/left Gait velocity: reduced Gait velocity interpretation: <1.31 ft/sec, indicative of household ambulator General Gait Details: pt can walk on the hall with no LOB  Stairs            Wheelchair Mobility    Modified Rankin (Stroke Patients Only)       Balance Overall balance assessment: History of Falls                                            Pertinent Vitals/Pain Pain Assessment: No/denies pain    Home Living Family/patient expects to be discharged to:: Private residence Living Arrangements: Spouse/significant other Available Help at Discharge: Family Type of Home: House Home Access: Level entry     Home Layout: One level Home Equipment: Environmental consultant - 4 wheels;Cane - single point      Prior Function Level of Independence: Independent with assistive device(s)         Comments: wife has back pain and cannot lift     Hand Dominance   Dominant Hand: Right    Extremity/Trunk Assessment   Upper Extremity Assessment Upper Extremity Assessment: Overall WFL for tasks assessed    Lower Extremity Assessment Lower Extremity Assessment: Overall WFL for tasks assessed    Cervical / Trunk Assessment Cervical / Trunk Assessment: Normal  Communication   Communication: No difficulties  Cognition Arousal/Alertness: Awake/alert Behavior During Therapy: WFL for tasks assessed/performed Overall Cognitive Status: History of cognitive impairments - at baseline                                 General Comments: pt is fine to follow instructions for PT      General Comments      Exercises     Assessment/Plan  PT Assessment Patient needs continued PT services  PT Problem List Decreased range of motion;Decreased activity tolerance;Decreased balance;Decreased mobility;Decreased coordination;Decreased cognition;Decreased knowledge of use of DME;Decreased safety awareness       PT Treatment Interventions DME instruction;Gait training;Functional mobility training;Therapeutic activities;Therapeutic exercise;Balance training;Neuromuscular re-education;Patient/family education    PT Goals (Current goals can be found in the Care Plan section)  Acute Rehab PT Goals Patient Stated Goal: to get home and feel stronger PT Goal Formulation: With patient Time For Goal Achievement:  05/10/18 Potential to Achieve Goals: Good    Frequency Min 3X/week   Barriers to discharge Decreased caregiver support wife unable to assist him    Co-evaluation               AM-PAC PT "6 Clicks" Daily Activity  Outcome Measure Difficulty turning over in bed (including adjusting bedclothes, sheets and blankets)?: A Little Difficulty moving from lying on back to sitting on the side of the bed? : A Little Difficulty sitting down on and standing up from a chair with arms (e.g., wheelchair, bedside commode, etc,.)?: A Little Help needed moving to and from a bed to chair (including a wheelchair)?: A Little Help needed walking in hospital room?: A Little Help needed climbing 3-5 steps with a railing? : A Little 6 Click Score: 18    End of Session Equipment Utilized During Treatment: Gait belt Activity Tolerance: Patient tolerated treatment well;Patient limited by fatigue Patient left: in bed;with call bell/phone within reach;with bed alarm set Nurse Communication: Mobility status PT Visit Diagnosis: Unsteadiness on feet (R26.81);Difficulty in walking, not elsewhere classified (R26.2)    Time: 9539-6728 PT Time Calculation (min) (ACUTE ONLY): 22 min   Charges:   PT Evaluation $PT Eval Moderate Complexity: 1 Mod          Ramond Dial 04/26/2018, 6:51 PM   Mee Hives, PT MS Acute Rehab Dept. Number: [place]

## 2018-04-26 NOTE — Discharge Summary (Signed)
Physician Discharge Summary  [name] [name] LGX:[id number] DOB: [birth year] DOA: 04/24/2018  PCP: Hermine Messick, MD  Admit date: 04/24/2018 Discharge date: 04/26/2018  Time spent: 45 minutes  Recommendations for Outpatient Follow-up:  -Will be discharged home today. -Advised to follow-up with PCP in 2 weeks.  Discharge Diagnoses:  Principal Problem:   Altered mental status Active Problems:   COPD (chronic obstructive pulmonary disease) (HCC)   HTN (hypertension)   Cirrhosis (HCC)   Seizure disorder (HCC)   GERD (gastroesophageal reflux disease)   Thrombocytopenia (HCC)   Hepatic encephalopathy (HCC)   Hyperammonemia (HCC)   Discharge Condition: Stable and improved  Filed Weights   04/24/18 1648  Weight: 96.6 kg    History of present illness:  As per Dr. Olevia Bowens on 9/2: [name] [name] is a 57 y.o. male with medical history significant of anemia, right ankle deformity, anxiety, osteoarthritis, bipolar 1 disorder, right eye cataract and blindness, chronic systolic heart failure, cerebrovascular disease,  chronic back pain, COPD, dementia, depression, type 2 diabetes, diverticulosis, GERD, impaired hearing hyperlipidemia, hypertension, bilateral lymphedema, mental retardation, noncompliance of medication, seizure disorder, thrombocytopenia, cholelithiasis, history of hepatitis, history of hepatic encephalopathy, liver cirrhosis who was brought to the emergency department due to [place].  He is unable to provide further information.  ED Course: Initial vital signs temperature 98.4 F, pulse 51, respirations 20, blood pressure 152/98 mmHg and O2 sat 98% on room air.  His urinalysis was normal.  UDS was positive for benzodiazepines.  CBC showed a white count was 4.0 with a normal differential, hemoglobin of 14.3 g/dL and platelets of 66.  PT 16.6 seconds and INR 1.35.  Troponin was less than 0.03 ng/mL.  BNP was 142.0 pg/mL.  CMP showed an albumin of 3.4 g/dL, AST of 67 units/L and  total bilirubin of 2.9 mg/dL.  All other values were normal.  Ammonia level was 69 mol/L.  Hospital Course:   Hepatic encephalopathy -Ammonia levels have decreased, patient does not like taking his lactulose due to diarrhea, have counseled on importance of being compliant with his medication. -He is currently at baseline level of mentation, alert and oriented and able to answer questions.  Asked  COPD -Not currently exacerbated. -Continue nebulizers.  Hypertension -Was quite elevated on admission, systolics down to 814 on discharge. -He is currently on his home medications, will need close outpatient follow-up.  GERD -Continue Protonix.  Chronic thrombocytopenia -Due to splenic sequestration, stable in the 50-60,000, no signs of active bleeding.  Hepatic cirrhosis -Continue rifaximin, instructed on lactulose use. -Outpatient follow-up with his GI physician.  Procedures:  None  Consultations:  None  Discharge Instructions  Discharge Instructions    Diet - low sodium heart healthy   Complete by:  As directed    Increase activity slowly   Complete by:  As directed      Allergies as of 04/26/2018      Reactions   Penicillins Rash   Has patient had a PCN reaction causing immediate rash, facial/tongue/throat swelling, SOB or lightheadedness with hypotension: Yes Has patient had a PCN reaction causing severe rash involving mucus membranes or skin necrosis: No Has patient had a PCN reaction that required hospitalization No Has patient had a PCN reaction occurring within the last 10 years: No If all of the above answers are "NO", then may proceed with Cephalosporin use.      Medication List    TAKE these medications   albuterol 108 (90 Base) MCG/ACT inhaler Commonly known  as:  PROVENTIL HFA;VENTOLIN HFA Inhale 2 puffs into the lungs every 4 (four) hours as needed for wheezing or shortness of breath.   ALPRAZolam 0.5 MG tablet Commonly known as:  XANAX Take 1 tablet  by mouth 3 (three) times daily as needed.   ammonium lactate 12 % cream Commonly known as:  AMLACTIN Apply 1 application topically daily as needed.   ezetimibe 10 MG tablet Commonly known as:  ZETIA Take 10 mg by mouth daily.   furosemide 20 MG tablet Commonly known as:  LASIX Take 20 mg by mouth daily.   lactulose 10 GM/15ML solution Commonly known as:  CHRONULAC Take 60 mLs (40 g total) by mouth 3 (three) times daily. Titrate for 3-4 bowel motions daily. What changed:  how much to take   metoprolol tartrate 50 MG tablet Commonly known as:  LOPRESSOR Take 50 mg by mouth 3 (three) times daily.   oxyCODONE 5 MG immediate release tablet Commonly known as:  Oxy IR/ROXICODONE Take 1 tablet by mouth 3 (three) times daily.   pantoprazole 40 MG tablet Commonly known as:  PROTONIX Take 40 mg by mouth daily.   potassium chloride SA 20 MEQ tablet Commonly known as:  K-DUR,KLOR-CON Take 20 mEq by mouth daily.   QUEtiapine 25 MG tablet Commonly known as:  SEROQUEL Take 1 tablet (25 mg total) by mouth at bedtime. What changed:  how much to take   rifaximin 550 MG Tabs tablet Commonly known as:  XIFAXAN Take 1 tablet (550 mg total) by mouth 2 (two) times daily.   saccharomyces boulardii 250 MG capsule Commonly known as:  FLORASTOR Take 1 capsule (250 mg total) by mouth 2 (two) times daily.   silver sulfADIAZINE 1 % cream Commonly known as:  SILVADENE Apply 1 application topically daily. As directed   traZODone 50 MG tablet Commonly known as:  DESYREL Take 0.5 tablets by mouth at bedtime as needed.   umeclidinium-vilanterol 62.5-25 MCG/INH Aepb Commonly known as:  ANORO ELLIPTA Inhale 1 puff into the lungs daily.      Allergies  Allergen Reactions  . Penicillins Rash    Has patient had a PCN reaction causing immediate rash, facial/tongue/throat swelling, SOB or lightheadedness with hypotension: Yes Has patient had a PCN reaction causing severe rash involving mucus  membranes or skin necrosis: No Has patient had a PCN reaction that required hospitalization No Has patient had a PCN reaction occurring within the last 10 years: No If all of the above answers are "NO", then may proceed with Cephalosporin use.    Follow-up Information    Hermine Messick, MD Follow up.   Specialty:  Family Medicine Contact information: [address] [place] [address] [phone]        Redmond School, MD. Schedule an appointment as soon as possible for a visit in 2 week(s).   Specialty:  Internal Medicine Contact information: [address] [place] [address] [phone]            The results of significant diagnostics from this hospitalization (including imaging, microbiology, ancillary and laboratory) are listed below for reference.    Significant Diagnostic Studies: Dg Chest 2 View  Result Date: 04/24/2018 CLINICAL DATA:  COPD.  Confusion EXAM: CHEST - 2 VIEW FINDINGS: Stable large cardiac silhouette. There is fine bilateral airspace disease. No focal consolidation. No pneumothorax. No pleural fluid IMPRESSION: Bilateral airspace disease suggests pulmonary edema. Diffuse pulmonary infection could have a similar pattern but less favored. Electronically Signed   By: Nicole Kindred  Leonia Reeves M.D.   On: 04/24/2018 17:26   Ct Head Wo Contrast  Result Date: 04/24/2018 CLINICAL DATA:  Encephalopathy confusion EXAM: CT HEAD WITHOUT CONTRAST TECHNIQUE: Contiguous axial images were obtained from the base of the skull through the vertex without intravenous contrast. COMPARISON:  02/17/2018 FINDINGS: Brain: No acute territorial infarction, hemorrhage or intracranial mass. The ventricles are nonenlarged. Mild atrophy. Vascular: No hyperdense vessels.  Carotid vascular calcification Skull: No fracture Sinuses/Orbits: Chronic right globe deformity. No acute orbital abnormality. Clear paranasal sinuses Other: None IMPRESSION: 1. No CT evidence for acute intracranial  abnormality. 2. Atrophy Electronically Signed   By: Donavan Foil M.D.   On: 04/24/2018 19:59    Microbiology: Recent Results (from the past 240 hour(s))  Urine culture     Status: None   Collection Time: 04/24/18  7:20 PM  Result Value Ref Range Status   Specimen Description   Final    URINE, CLEAN CATCH Performed at [facility], [address], [place], [place] [address]    Special Requests   Final    NONE Performed at [facility], [address], [place], [place] [address]    Culture   Final    NO GROWTH Performed at [facility], [place] [address], [place], [place] [address]    Report Status 04/26/2018 FINAL  Final  MRSA PCR Screening     Status: None   Collection Time: 04/24/18 10:27 PM  Result Value Ref Range Status   MRSA by PCR NEGATIVE NEGATIVE Final    Comment:        The GeneXpert MRSA Assay (FDA approved for NASAL specimens only), is one component of a comprehensive MRSA colonization surveillance program. It is not intended to diagnose MRSA infection nor to guide or monitor treatment for MRSA infections. Performed at [facility], [address] [place], [place] [address]      Labs: Basic Metabolic Panel: Recent Labs  Lab 04/24/18 1835 04/25/18 0559  NA 142 142  K 4.0 3.5  CL 110 111  CO2 26 23  GLUCOSE 98 101*  BUN 7 8  CREATININE 0.73 0.60*  CALCIUM 9.3 8.6*  MG 2.2  --    Liver Function Tests: Recent Labs  Lab 04/24/18 1835 04/25/18 0559  AST 67* 51*  ALT 31 26  ALKPHOS 113 83  BILITOT 2.9* 2.9*  PROT 7.0 5.8*  ALBUMIN 3.4* 2.8*   No results for input(s): LIPASE, AMYLASE in the last 168 hours. Recent Labs  Lab 04/24/18 1835 04/25/18 0559  AMMONIA 69* 47*   CBC: Recent Labs  Lab 04/24/18 1835 04/25/18 0559  WBC 4.0 3.5*  NEUTROABS 2.1 1.8  HGB 14.3 13.2  HCT 42.0 39.5  MCV 103.4* 103.7*  PLT 66* 59*   Cardiac Enzymes: Recent Labs  Lab 04/24/18 1835  TROPONINI <0.03   BNP: BNP (last 3 results) Recent Labs     02/17/18 1536 04/24/18 1835  BNP 56.0 142.0*    ProBNP (last 3 results) No results for input(s): PROBNP in the last 8760 hours.  CBG: Recent Labs  Lab 04/25/18 1137 04/25/18 1614 04/25/18 2044 04/26/18 0741 04/26/18 1133  GLUCAP 107* 86 86 82 100*       Signed:  [facility] Pager: [phone] 04/26/2018, 2:27 PM

## 2018-04-26 NOTE — Care Management Note (Signed)
Case Management Note  Patient Details  Name: [name] [name] MRN: [id number] Date of Birth: [birth year]  Subjective/Objective:  Admitted with AMS. From home with wife. ind with ADL's. PT recommends HH PT. Pt agreeable has no preference of provider.                   Action/Plan: Referral for [facility] PT given to [facility] rep. Pt aware HH has 48 hrs to make first visit.   Expected Discharge Date:  04/26/18               Expected Discharge Plan:  [place]  In-House Referral:  NA  Discharge planning Services  CM Consult  Post Acute Care Choice:  Home Health Choice offered to:  Patient  DME Arranged:    DME Agency:     HH Arranged:  RN [place] Agency:  [place]  Status of Service:  Completed, signed off  If discussed at [place] of Stay Meetings, dates discussed:    Additional Comments:  Sherald Barge, RN 04/26/2018, 3:43 PM

## 2018-05-03 DIAGNOSIS — M86671 Other chronic osteomyelitis, right ankle and foot: Secondary | ICD-10-CM | POA: Diagnosis not present

## 2018-05-03 DIAGNOSIS — J449 Chronic obstructive pulmonary disease, unspecified: Secondary | ICD-10-CM | POA: Diagnosis not present

## 2018-05-03 DIAGNOSIS — R262 Difficulty in walking, not elsewhere classified: Secondary | ICD-10-CM | POA: Diagnosis not present

## 2018-05-03 DIAGNOSIS — E1165 Type 2 diabetes mellitus with hyperglycemia: Secondary | ICD-10-CM | POA: Diagnosis not present

## 2018-05-03 DIAGNOSIS — M6281 Muscle weakness (generalized): Secondary | ICD-10-CM | POA: Diagnosis not present

## 2018-05-03 DIAGNOSIS — S82309A Unspecified fracture of lower end of unspecified tibia, initial encounter for closed fracture: Secondary | ICD-10-CM | POA: Diagnosis not present

## 2018-05-03 DIAGNOSIS — E1149 Type 2 diabetes mellitus with other diabetic neurological complication: Secondary | ICD-10-CM | POA: Diagnosis not present

## 2018-05-03 DIAGNOSIS — E11621 Type 2 diabetes mellitus with foot ulcer: Secondary | ICD-10-CM | POA: Diagnosis not present

## 2018-05-03 DIAGNOSIS — K7460 Unspecified cirrhosis of liver: Secondary | ICD-10-CM | POA: Diagnosis not present

## 2018-05-03 DIAGNOSIS — I6350 Cerebral infarction due to unspecified occlusion or stenosis of unspecified cerebral artery: Secondary | ICD-10-CM | POA: Diagnosis not present

## 2018-05-22 DIAGNOSIS — R69 Illness, unspecified: Secondary | ICD-10-CM | POA: Diagnosis not present

## 2018-06-02 DIAGNOSIS — K7460 Unspecified cirrhosis of liver: Secondary | ICD-10-CM | POA: Diagnosis not present

## 2018-06-02 DIAGNOSIS — E1149 Type 2 diabetes mellitus with other diabetic neurological complication: Secondary | ICD-10-CM | POA: Diagnosis not present

## 2018-06-02 DIAGNOSIS — M6281 Muscle weakness (generalized): Secondary | ICD-10-CM | POA: Diagnosis not present

## 2018-06-02 DIAGNOSIS — E11621 Type 2 diabetes mellitus with foot ulcer: Secondary | ICD-10-CM | POA: Diagnosis not present

## 2018-06-02 DIAGNOSIS — I6350 Cerebral infarction due to unspecified occlusion or stenosis of unspecified cerebral artery: Secondary | ICD-10-CM | POA: Diagnosis not present

## 2018-06-02 DIAGNOSIS — E1165 Type 2 diabetes mellitus with hyperglycemia: Secondary | ICD-10-CM | POA: Diagnosis not present

## 2018-06-02 DIAGNOSIS — S82309A Unspecified fracture of lower end of unspecified tibia, initial encounter for closed fracture: Secondary | ICD-10-CM | POA: Diagnosis not present

## 2018-06-02 DIAGNOSIS — R262 Difficulty in walking, not elsewhere classified: Secondary | ICD-10-CM | POA: Diagnosis not present

## 2018-06-02 DIAGNOSIS — M86671 Other chronic osteomyelitis, right ankle and foot: Secondary | ICD-10-CM | POA: Diagnosis not present

## 2018-06-02 DIAGNOSIS — J449 Chronic obstructive pulmonary disease, unspecified: Secondary | ICD-10-CM | POA: Diagnosis not present

## 2018-06-04 IMAGING — CT CT HEAD W/O CM
3 series · 16 of 47 positions shown, 19 images · non-contrast
Comparison: 12/08/2015

CLINICAL DATA: Witnessed seizure.  Urinary incontinence.

EXAM:
CT HEAD WITHOUT CONTRAST
TECHNIQUE: Contiguous axial images were obtained from the base of the skull
through the vertex without intravenous contrast.

[Series 3: head trauma wo · axial · 0.43mm/px · z∈[+100,+235]mm · 10 of 33 slices shown, 13 images]
[im 3/33  brain]
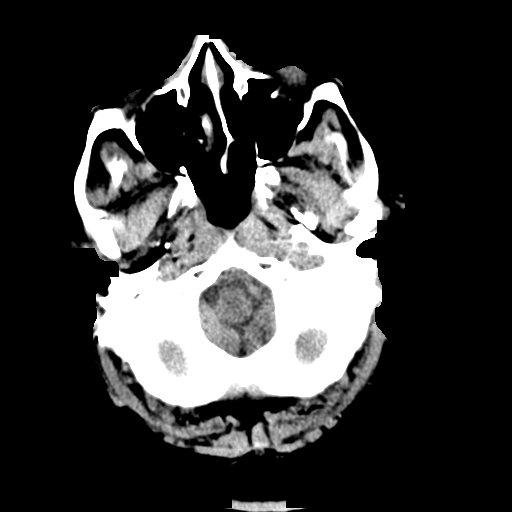
[im 3/33  bone]
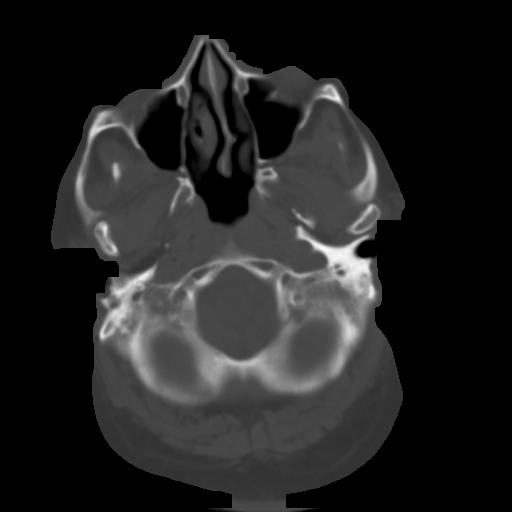
[im 6/33  brain]
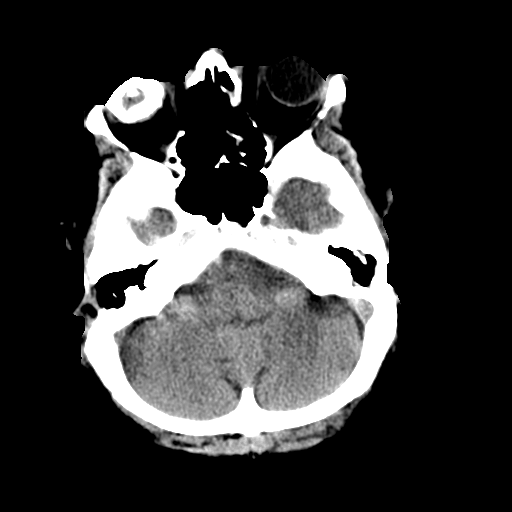
[im 9/33  brain]
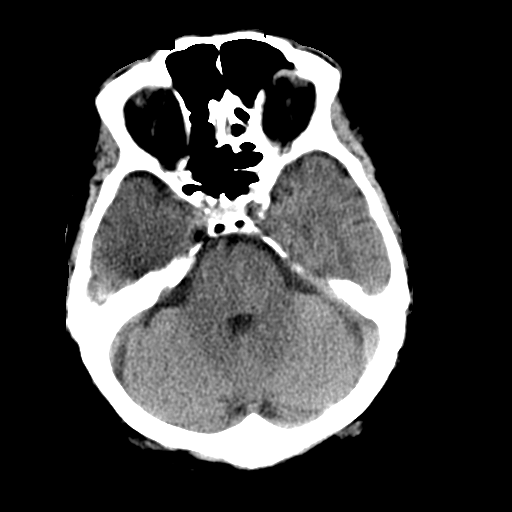
[im 12/33  brain]
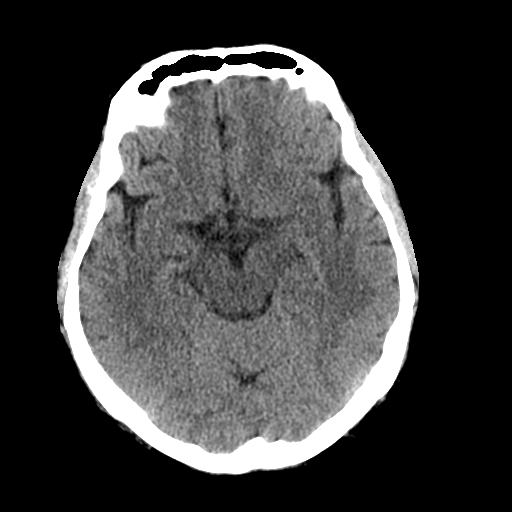
[im 15/33  brain]
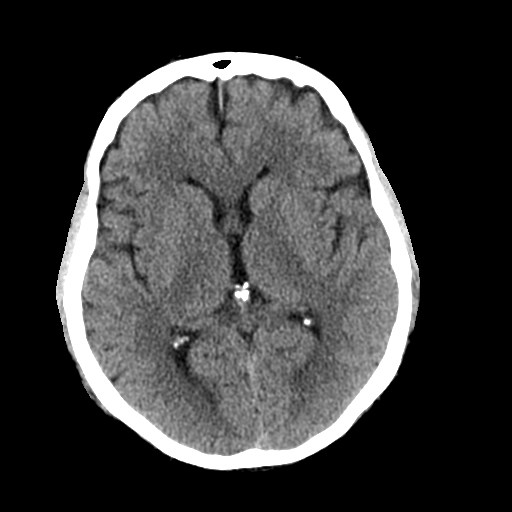
[im 15/33  bone]
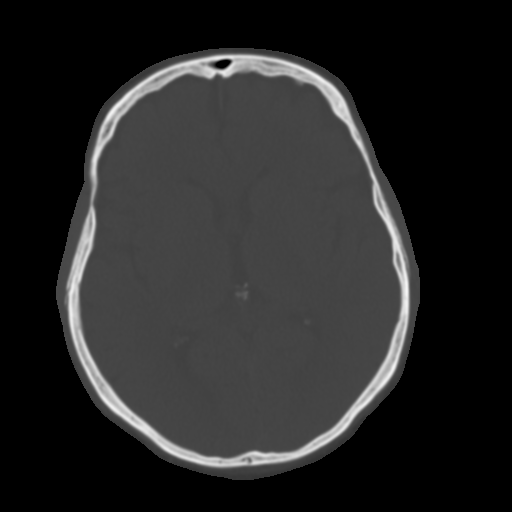
[im 18/33  brain]
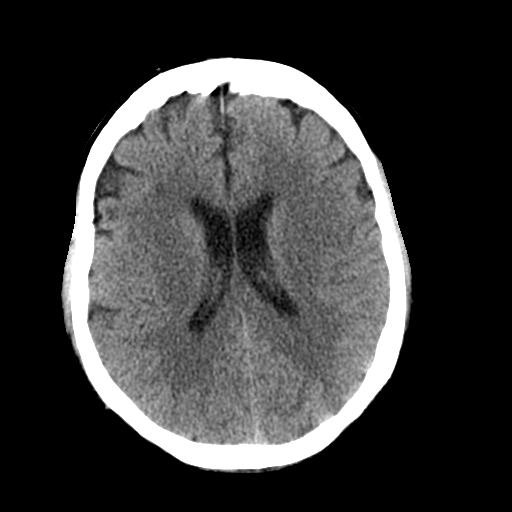
[im 21/33  brain]
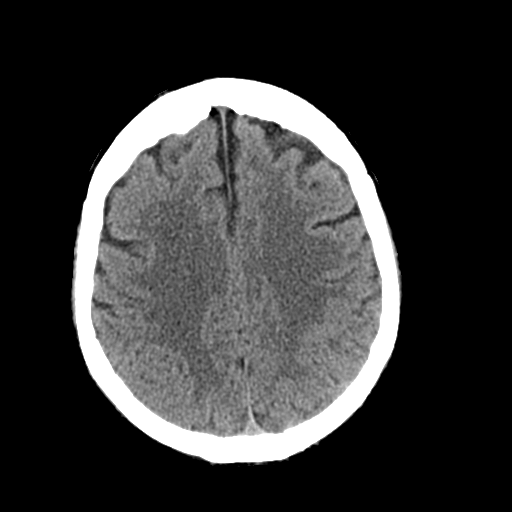
[im 25/33  brain]
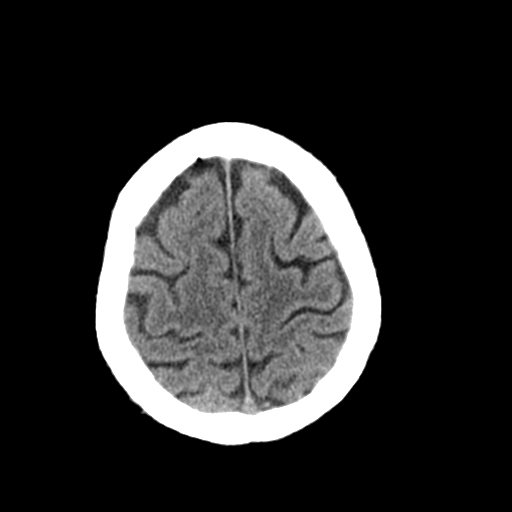
[im 27/33  brain]
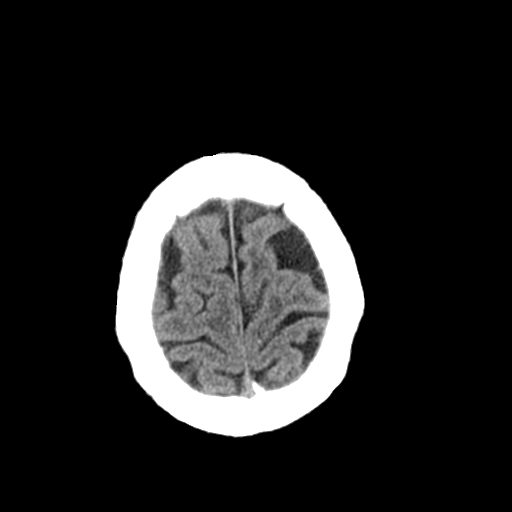
[im 27/33  bone]
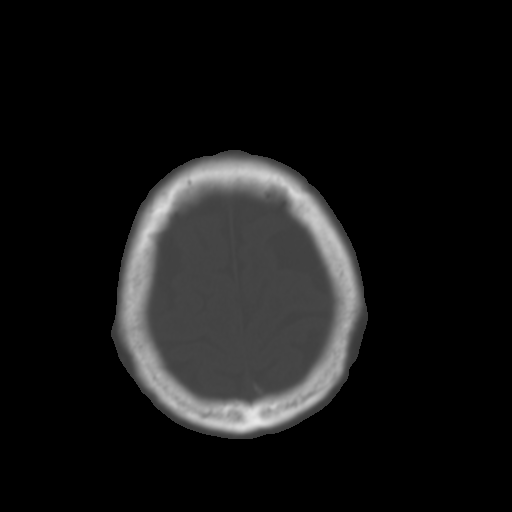
[im 30/33  brain]
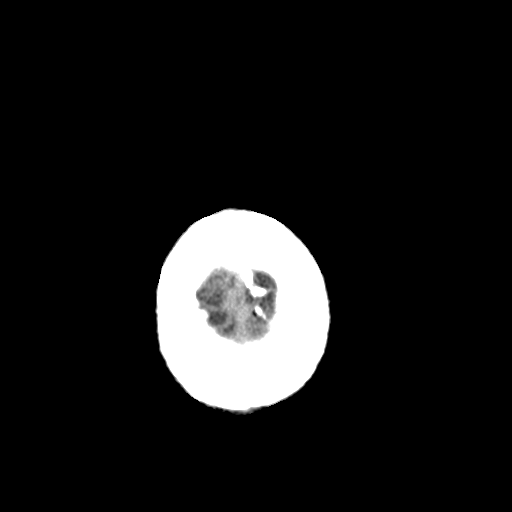

[Series 5: coronal soft tissue · coronal · 0.34mm/px · 3 of 68 slices shown]
[im 23/68  brain]
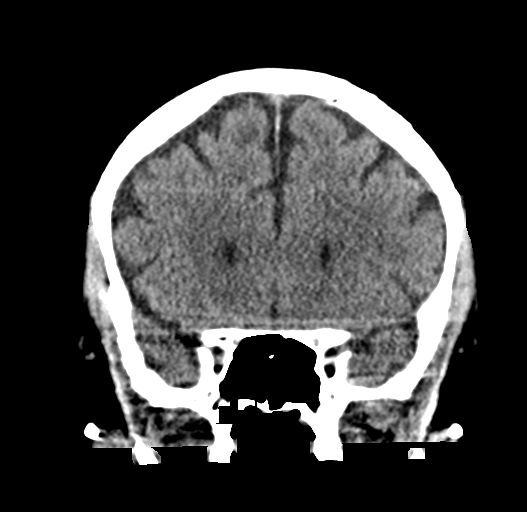
[im 30/68  brain]
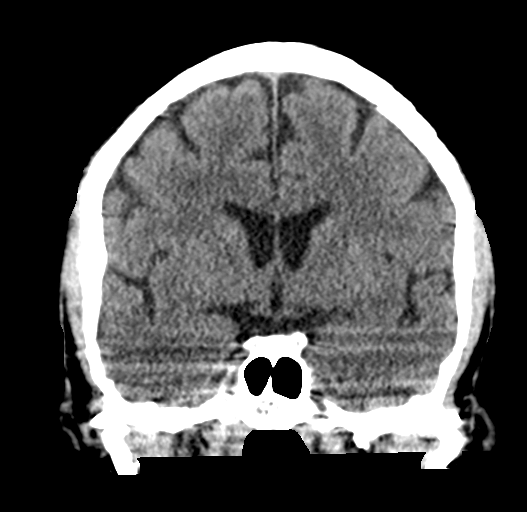
[im 38/68  brain]
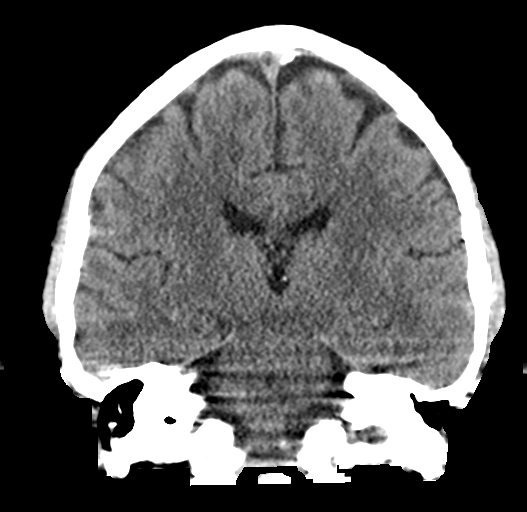

[Series 6: sagittal soft tissue · sagittal · 0.34mm/px · 3 of 60 slices shown]
[im 20/60  brain]
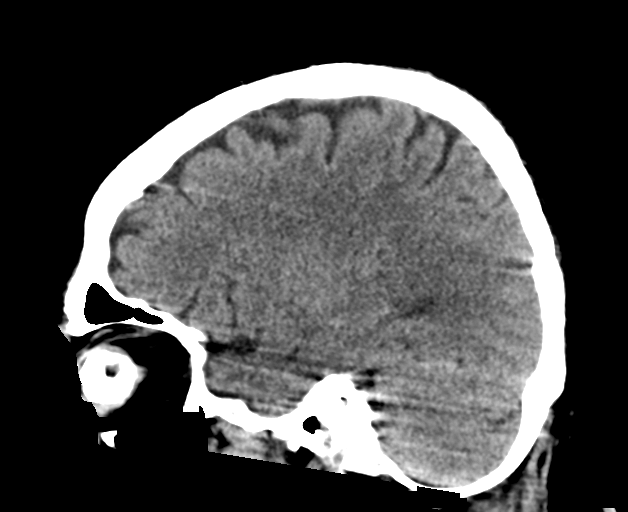
[im 30/60  brain]
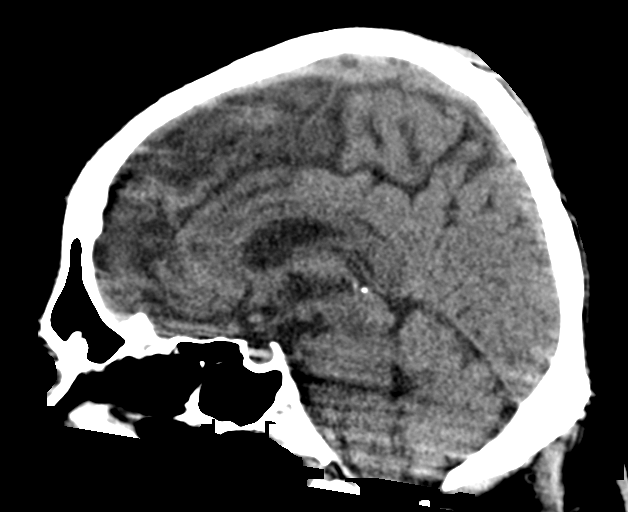
[im 40/60  brain]
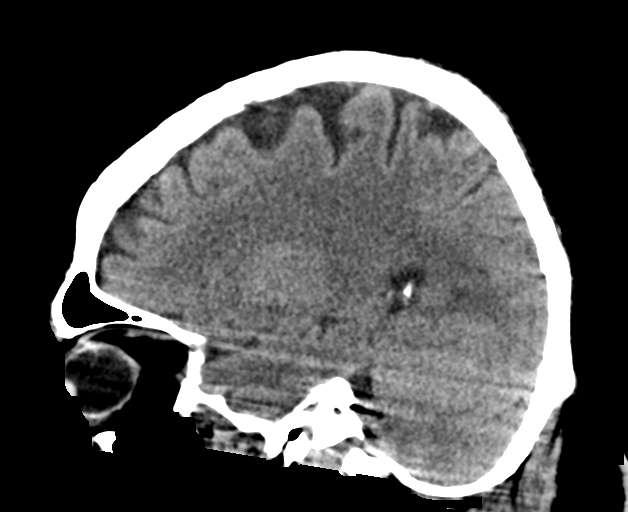

[16 of 47 positions shown; findings below may reference images not displayed]

FINDINGS: Brain: No acute intracranial hemorrhage. No focal mass lesion. No CT
evidence of acute infarction. No midline shift or mass effect. No
hydrocephalus. Basilar cisterns are patent.

Vascular: No hyperdense vessel or unexpected calcification.

Skull: Bilateral bilateral mastoidectomies.  No acute findings.

Sinuses/Orbits: Calcification of the RIGHT globe (phthisis bulbi).
No acute orbital findings.

Other: None
IMPRESSION: 1. No acute intracranial findings.
2. Bilateral mastoidectomies.

## 2018-06-08 DIAGNOSIS — K7460 Unspecified cirrhosis of liver: Secondary | ICD-10-CM | POA: Diagnosis not present

## 2018-06-08 DIAGNOSIS — R69 Illness, unspecified: Secondary | ICD-10-CM | POA: Diagnosis not present

## 2018-06-08 DIAGNOSIS — F319 Bipolar disorder, unspecified: Secondary | ICD-10-CM | POA: Diagnosis not present

## 2018-06-08 DIAGNOSIS — H5462 Unqualified visual loss, left eye, normal vision right eye: Secondary | ICD-10-CM | POA: Diagnosis not present

## 2018-06-08 DIAGNOSIS — I10 Essential (primary) hypertension: Secondary | ICD-10-CM | POA: Diagnosis not present

## 2018-06-08 DIAGNOSIS — G894 Chronic pain syndrome: Secondary | ICD-10-CM | POA: Diagnosis not present

## 2018-06-08 DIAGNOSIS — E1142 Type 2 diabetes mellitus with diabetic polyneuropathy: Secondary | ICD-10-CM | POA: Diagnosis not present

## 2018-08-01 ENCOUNTER — Encounter: Payer: Self-pay | Admitting: Internal Medicine

## 2018-08-01 ENCOUNTER — Telehealth: Payer: Self-pay | Admitting: Internal Medicine

## 2018-08-01 ENCOUNTER — Ambulatory Visit: Payer: Medicare HMO | Admitting: Gastroenterology

## 2018-08-01 NOTE — Telephone Encounter (Signed)
Patient was a no show and letter sent  °

## 2018-10-03 ENCOUNTER — Encounter: Payer: Self-pay | Admitting: Internal Medicine

## 2018-10-05 ENCOUNTER — Other Ambulatory Visit: Payer: Self-pay | Admitting: Gastroenterology

## 2018-10-05 NOTE — Telephone Encounter (Signed)
See June 2019 telephone note. Patient reportedly taking omeprazole at that time and stopped pantoprazole. Why are we getting request for pantoprazole.   Patient also no showed 07/2018 ov.   Find out what ppi he is taking.  Make return ov with rmr.

## 2019-01-21 ENCOUNTER — Other Ambulatory Visit: Payer: Self-pay

## 2019-01-21 ENCOUNTER — Encounter (HOSPITAL_COMMUNITY): Payer: Self-pay | Admitting: Emergency Medicine

## 2019-01-21 ENCOUNTER — Observation Stay (HOSPITAL_COMMUNITY)
Admission: EM | Admit: 2019-01-21 | Discharge: 2019-01-23 | Disposition: A | Discharge status: Home / Self Care | Payer: Self-pay | Attending: Family Medicine | Admitting: Family Medicine

## 2019-01-21 ENCOUNTER — Emergency Department (HOSPITAL_COMMUNITY): Payer: Self-pay

## 2019-01-21 DIAGNOSIS — Z8673 Personal history of transient ischemic attack (TIA), and cerebral infarction without residual deficits: Secondary | ICD-10-CM | POA: Insufficient documentation

## 2019-01-21 DIAGNOSIS — F79 Unspecified intellectual disabilities: Secondary | ICD-10-CM | POA: Insufficient documentation

## 2019-01-21 DIAGNOSIS — K7460 Unspecified cirrhosis of liver: Secondary | ICD-10-CM | POA: Insufficient documentation

## 2019-01-21 DIAGNOSIS — K7682 Hepatic encephalopathy: Secondary | ICD-10-CM | POA: Diagnosis present

## 2019-01-21 DIAGNOSIS — I10 Essential (primary) hypertension: Secondary | ICD-10-CM | POA: Diagnosis present

## 2019-01-21 DIAGNOSIS — E7220 Disorder of urea cycle metabolism, unspecified: Secondary | ICD-10-CM | POA: Diagnosis present

## 2019-01-21 DIAGNOSIS — D696 Thrombocytopenia, unspecified: Secondary | ICD-10-CM | POA: Diagnosis present

## 2019-01-21 DIAGNOSIS — F319 Bipolar disorder, unspecified: Secondary | ICD-10-CM | POA: Insufficient documentation

## 2019-01-21 DIAGNOSIS — Z79899 Other long term (current) drug therapy: Secondary | ICD-10-CM | POA: Insufficient documentation

## 2019-01-21 DIAGNOSIS — I272 Pulmonary hypertension, unspecified: Secondary | ICD-10-CM | POA: Insufficient documentation

## 2019-01-21 DIAGNOSIS — G40909 Epilepsy, unspecified, not intractable, without status epilepticus: Secondary | ICD-10-CM | POA: Insufficient documentation

## 2019-01-21 DIAGNOSIS — F419 Anxiety disorder, unspecified: Secondary | ICD-10-CM | POA: Insufficient documentation

## 2019-01-21 DIAGNOSIS — Z7951 Long term (current) use of inhaled steroids: Secondary | ICD-10-CM | POA: Insufficient documentation

## 2019-01-21 DIAGNOSIS — I5022 Chronic systolic (congestive) heart failure: Secondary | ICD-10-CM | POA: Insufficient documentation

## 2019-01-21 DIAGNOSIS — I4891 Unspecified atrial fibrillation: Principal | ICD-10-CM | POA: Diagnosis present

## 2019-01-21 DIAGNOSIS — Z23 Encounter for immunization: Secondary | ICD-10-CM | POA: Insufficient documentation

## 2019-01-21 DIAGNOSIS — F0390 Unspecified dementia without behavioral disturbance: Secondary | ICD-10-CM | POA: Insufficient documentation

## 2019-01-21 DIAGNOSIS — K7200 Acute and subacute hepatic failure without coma: Secondary | ICD-10-CM | POA: Insufficient documentation

## 2019-01-21 DIAGNOSIS — Z1159 Encounter for screening for other viral diseases: Secondary | ICD-10-CM | POA: Insufficient documentation

## 2019-01-21 DIAGNOSIS — K219 Gastro-esophageal reflux disease without esophagitis: Secondary | ICD-10-CM | POA: Insufficient documentation

## 2019-01-21 DIAGNOSIS — J449 Chronic obstructive pulmonary disease, unspecified: Secondary | ICD-10-CM | POA: Insufficient documentation

## 2019-01-21 DIAGNOSIS — I110 Hypertensive heart disease with heart failure: Secondary | ICD-10-CM | POA: Insufficient documentation

## 2019-01-21 DIAGNOSIS — E785 Hyperlipidemia, unspecified: Secondary | ICD-10-CM | POA: Insufficient documentation

## 2019-01-21 LAB — COMPREHENSIVE METABOLIC PANEL
ALT: 25 U/L (ref 0–44)
AST: 52 U/L — ABNORMAL HIGH (ref 15–41)
Albumin: 3.3 g/dL — ABNORMAL LOW (ref 3.5–5.0)
Alkaline Phosphatase: 131 U/L — ABNORMAL HIGH (ref 38–126)
Anion gap: 12 (ref 5–15)
BUN: 7 mg/dL (ref 6–20)
CO2: 22 mmol/L (ref 22–32)
Calcium: 8.6 mg/dL — ABNORMAL LOW (ref 8.9–10.3)
Chloride: 107 mmol/L (ref 98–111)
Creatinine, Ser: 0.7 mg/dL (ref 0.61–1.24)
GFR calc Af Amer: >60 mL/min (ref >60)
GFR calc non Af Amer: >60 mL/min (ref >60)
Glucose, Bld: 166 mg/dL — ABNORMAL HIGH (ref 70–99)
Potassium: 3 mmol/L — ABNORMAL LOW (ref 3.5–5.1)
Sodium: 141 mmol/L (ref 135–145)
Total Bilirubin: 2.5 mg/dL — ABNORMAL HIGH (ref 0.3–1.2)
Total Protein: 7 g/dL (ref 6.5–8.1)

## 2019-01-21 LAB — CBC
HCT: 42.1 % (ref 39.0–52.0)
Hemoglobin: 14.1 g/dL (ref 13.0–17.0)
MCH: 35.1 pg — ABNORMAL HIGH (ref 26.0–34.0)
MCHC: 33.5 g/dL (ref 30.0–36.0)
MCV: 104.7 fL — ABNORMAL HIGH (ref 80.0–100.0)
Platelets: 48 10*3/uL — ABNORMAL LOW (ref 150–400)
RBC: 4.02 MIL/uL — ABNORMAL LOW (ref 4.22–5.81)
RDW: 14.2 % (ref 11.5–15.5)
WBC: 1.9 10*3/uL — ABNORMAL LOW (ref 4.0–10.5)
nRBC: 0 % (ref 0.0–0.2)

## 2019-01-21 LAB — PROTIME-INR
INR: 1.4 — ABNORMAL HIGH (ref 0.8–1.2)
Prothrombin Time: 16.6 seconds — ABNORMAL HIGH (ref 11.4–15.2)

## 2019-01-21 LAB — AMMONIA: Ammonia: 165 umol/L — ABNORMAL HIGH (ref 9–35)

## 2019-01-21 LAB — TROPONIN I: Troponin I: 0.05 ng/mL (ref <0.03)

## 2019-01-21 MED ORDER — LACTULOSE 10 GM/15ML PO SOLN
30.0000 g | Freq: Once | ORAL | Status: AC
  Administered 2019-01-22: 30 g via ORAL
  Filled 2019-01-21: qty 60

## 2019-01-21 MED ORDER — DILTIAZEM HCL 25 MG/5ML IV SOLN
20.0000 mg | Freq: Once | INTRAVENOUS | Status: AC
  Administered 2019-01-21: 20 mg via INTRAVENOUS

## 2019-01-21 MED ORDER — METOPROLOL TARTRATE 5 MG/5ML IV SOLN
INTRAVENOUS | Status: AC
  Administered 2019-01-21: 5 mg via INTRAVENOUS
  Filled 2019-01-21: qty 5

## 2019-01-21 MED ORDER — DILTIAZEM HCL 100 MG IV SOLR
5.0000 mg/h | INTRAVENOUS | Status: DC
  Administered 2019-01-21: 5 mg/h via INTRAVENOUS
  Administered 2019-01-22: 7.5 mg/h via INTRAVENOUS
  Filled 2019-01-21: qty 100

## 2019-01-21 MED ORDER — DILTIAZEM HCL 100 MG IV SOLR
INTRAVENOUS | Status: AC
  Administered 2019-01-21: 5 mg/h via INTRAVENOUS
  Filled 2019-01-21: qty 100

## 2019-01-21 MED ORDER — SODIUM CHLORIDE 0.9 % IV SOLN
INTRAVENOUS | Status: DC
  Administered 2019-01-21: 10 mL/h via INTRAVENOUS
  Administered 2019-01-22: 02:00:00 via INTRAVENOUS

## 2019-01-21 MED ORDER — POTASSIUM CHLORIDE CRYS ER 20 MEQ PO TBCR
40.0000 meq | EXTENDED_RELEASE_TABLET | Freq: Once | ORAL | Status: AC
  Administered 2019-01-22: 40 meq via ORAL
  Filled 2019-01-21: qty 2

## 2019-01-21 MED ORDER — DILTIAZEM LOAD VIA INFUSION
20.0000 mg | Freq: Once | INTRAVENOUS | Status: AC
  Administered 2019-01-21: 20 mg via INTRAVENOUS
  Filled 2019-01-21: qty 20

## 2019-01-21 MED ORDER — METOPROLOL TARTRATE 5 MG/5ML IV SOLN
5.0000 mg | Freq: Once | INTRAVENOUS | Status: AC
  Administered 2019-01-21: 5 mg via INTRAVENOUS

## 2019-01-21 MED ORDER — SODIUM CHLORIDE 0.9 % IV BOLUS
500.0000 mL | Freq: Once | INTRAVENOUS | Status: AC
  Administered 2019-01-21: 500 mL via INTRAVENOUS

## 2019-01-21 NOTE — ED Triage Notes (Signed)
Pt brought in by EMS after pt had seizure. Pt c/o abdominal pain.

## 2019-01-21 NOTE — ED Provider Notes (Signed)
[facility] EMERGENCY DEPARTMENT Provider Note   CSN: [id number] Arrival date & time: 01/21/19  2240    History   Chief Complaint Chief Complaint  Patient presents with  . Seizures    HPI [name] [name] is a 58 y.o. male.     Patient presents with questionable seizure.  He was brought in by the paramedics and was in rapid A. fib around 180  The history is provided by the patient and the EMS personnel.  Seizures  Seizure activity on arrival: no   Seizure type:  Unable to specify Preceding symptoms: no sensation of an aura present   Initial focality:  None Episode characteristics: no abnormal movements   Postictal symptoms: confusion   Return to baseline: yes   Severity:  Moderate Timing:  Once Progression:  Resolved Context: not alcohol withdrawal   Recent head injury:  No recent head injuries PTA treatment:  None History of seizures: yes     Past Medical History:  Diagnosis Date  . Anemia   . Ankle deformity, right    chronic  . Anxiety   . Arthritis   . Bipolar 1 disorder ([place])   . Blind right eye   . Blood clots in brain    per patient  . Cardiomegaly   . Cataract    Right eye  . Cerebrovascular disease    Right vertebral artery  . Cholelithiasis    Asymptomatic  . Chronic back pain   . Chronic pain   . Cirrhosis ([place])    suspected NASH  . Cirrhosis ([place])    diagnosed 07/2010 when presented with first episode of hepatic encephalopathy, afp on 416/13=4.8,  U/S on 12/14/11  liver stable, pt has received 2 hep A/B vaccines  . COPD (chronic obstructive pulmonary disease) ([place])   . Dementia ([place])   . Depression   . Diabetes mellitus   . Diverticulosis   . Dyspnea   . GERD (gastroesophageal reflux disease)   . Hard of hearing   . Hepatic encephalopathy ([place])   . Hepatitis   . Hyperlipidemia   . Hypertension   . Lymphedema    R>L  . Mental retardation   . Noncompliance   . Seizures ([place])   . Thrombocytopenia [employer])     Patient Active  Problem List   Diagnosis Date Noted  . Chronic pain syndrome   . Cellulitis of right lower extremity 02/17/2018  . Dysphagia 10/10/2017  . Encounter for screening colonoscopy 10/10/2017  . Palliative care encounter   . Goals of care, counseling/discussion   . DNR (do not resuscitate) discussion   . Encounter for hospice care discussion   . Altered mental status 09/11/2017  . Acute encephalopathy 12/29/2016  . Subarachnoid hemorrhage ([place]) 12/29/2016  . Bacteremia 12/29/2016  . Fall 12/21/2016  . Cerebral contusion ([place]) 12/21/2016  . Pressure injury of skin 12/14/2016  . Anxiety   . Suicidal ideation   . Acute hepatic encephalopathy ([place]) 08/02/2013  . Hypokalemia 02/01/2013  . Hyperammonemia ([place]) 01/31/2013  . Urinary incontinence 01/31/2013  . Pancytopenia ([place]) 09/17/2012  . Edema of right lower extremity 04/28/2012  . Hepatic encephalopathy ([place]) 04/28/2012  . Arthritis of ankle or foot, degenerative 04/26/2012  . Hematemesis 12/07/2011  . Thrombocytopenia ([place]) 12/07/2011  . Anemia 12/07/2011  . COPD (chronic obstructive pulmonary disease) ([place]) 08/25/2011  . DM (diabetes mellitus) ([place]) 08/25/2011  . HTN (hypertension) 08/25/2011  . Coagulopathy ([place]) 08/25/2011  . Cirrhosis ([place]) 08/25/2011  . Depression 08/25/2011  .  Seizure disorder ([place]) 08/25/2011  . GERD (gastroesophageal reflux disease) 08/25/2011  . Tobacco abuse 08/25/2011  . Ankle fracture, bimalleolar, closed 08/04/2011    Past Surgical History:  Procedure Laterality Date  . CAST APPLICATION  01/27/5992   Procedure: MINOR CAST APPLICATION;  Surgeon: [name], MD;  Location: [facility] ORS;  Service: Orthopedics;  Laterality: Right;  no anesthesia , procedure room do not need or room !  . COLONOSCOPY  12/15/11   colonic divericulosis;poor prep, ACBE  recommended but has not been done  . COLONOSCOPY WITH PROPOFOL N/A 11/07/2017   Dr. Gala Romney: inadequate prep, internal hemorrhoids. Early interval due March  2020 due to poor prep  . ear operations    . ESOPHAGOGASTRODUODENOSCOPY  02/02/2011   Rourk-Normal esophagus.  No varices/Question mild portal gastropathy, extrinsic compression on the antrum, lesser curvature of uncertain significance, some minimally  nodular mucosa status post biopsy, patent pylorus, normal duodenum 1 and duodenum 2.  . ESOPHAGOGASTRODUODENOSCOPY  12/15/11   Rourk-->mild changes of portal gastropathy, gastric/duodenal erosions s/p bx  (reactive changes with mild chronic inflammation. No H.pylori  . ESOPHAGOGASTRODUODENOSCOPY (EGD) WITH PROPOFOL N/A 11/07/2017   Dr. Gala Romney: normal esophagus s/p dilation, portal hypertensive gastropathy, erosive gastropathy, normal duodenum  . FOOT SURGERY    . MALONEY DILATION N/A 11/07/2017   Procedure: Venia Minks DILATION;  Surgeon: Daneil Dolin, MD;  Location: [facility] ENDO SUITE;  Service: Endoscopy;  Laterality: N/A;  . ORIF ANKLE FRACTURE  08/27/2011   Procedure: OPEN REDUCTION INTERNAL FIXATION (ORIF) ANKLE FRACTURE;  Surgeon: [name], MD;  Location: [facility] ORS;  Service: Orthopedics;  Laterality: Right;  . TONSILLECTOMY          Home Medications    Prior to Admission medications   Medication Sig Start Date End Date Taking? Authorizing Provider  albuterol (PROVENTIL HFA;VENTOLIN HFA) 108 (90 Base) MCG/ACT inhaler Inhale 2 puffs into the lungs every 4 (four) hours as needed for wheezing or shortness of breath.    [provider]  ALPRAZolam Duanne Moron) 0.5 MG tablet Take 1 tablet by mouth 3 (three) times daily as needed. 01/23/18   [provider]  ammonium lactate (AMLACTIN) 12 % cream Apply 1 application topically daily as needed. 03/27/18   [provider]  ezetimibe (ZETIA) 10 MG tablet Take 10 mg by mouth daily.    [provider]  furosemide (LASIX) 20 MG tablet Take 20 mg by mouth daily.    [provider]  lactulose (CHRONULAC) 10 GM/15ML solution Take 60 mLs (40 g total) by mouth 3 (three)  times daily. Titrate for 3-4 bowel motions daily. Patient taking differently: Take 20 g by mouth 3 (three) times daily. Titrate for 3-4 bowel motions daily. 12/15/16   Elgergawy, Silver Huguenin, MD  metoprolol tartrate (LOPRESSOR) 50 MG tablet Take 50 mg by mouth 3 (three) times daily.     [provider]  oxyCODONE (OXY IR/ROXICODONE) 5 MG immediate release tablet Take 1 tablet by mouth 3 (three) times daily.  10/04/17   [provider]  pantoprazole (PROTONIX) 40 MG tablet Take 40 mg by mouth daily.    [provider]  potassium chloride SA (K-DUR,KLOR-CON) 20 MEQ tablet Take 20 mEq by mouth daily.     [provider]  QUEtiapine (SEROQUEL) 25 MG tablet Take 1 tablet (25 mg total) by mouth at bedtime. Patient taking differently: Take 50 mg by mouth at bedtime.  12/31/16   Samuella Cota, MD  rifaximin (XIFAXAN) 550 MG TABS tablet Take  1 tablet (550 mg total) by mouth 2 (two) times daily. 12/31/16   Samuella Cota, MD  saccharomyces boulardii (FLORASTOR) 250 MG capsule Take 1 capsule (250 mg total) by mouth 2 (two) times daily. 02/18/18   Barton Dubois, MD  silver sulfADIAZINE (SILVADENE) 1 % cream Apply 1 application topically daily. As directed 01/06/18   [provider]  traZODone (DESYREL) 50 MG tablet Take 0.5 tablets by mouth at bedtime as needed. 02/20/18   [provider]  umeclidinium-vilanterol (ANORO ELLIPTA) 62.5-25 MCG/INH AEPB Inhale 1 puff into the lungs daily. 10/06/17   [provider]    Family History Family History  Problem Relation Age of Onset  . Heart failure Mother   . Thyroid disease Mother   . Cancer Father   . Asthma Brother   . Heart attack Brother   . Cirrhosis Brother        EtOH  . Colon cancer Maternal Aunt     Social History Social History   Tobacco Use  . Smoking status: Current Every Day Smoker    Packs/day: 1.00    Years: 30.00    Pack years: 30.00    Types: Cigarettes  . Smokeless  tobacco: Never Used  . Tobacco comment: only smokes about 1-2 a day  Substance Use Topics  . Alcohol use: No  . Drug use: No     Allergies   Penicillins   Review of Systems Review of Systems  Unable to perform ROS: Mental status change  Neurological: Positive for seizures.     Physical Exam Updated Vital Signs BP 126/87   Pulse (!) 165   Temp 98.2 F (36.8 C) (Oral)   Resp (!) 41   Ht 5' 7.5" (1.715 m)   Wt 108.9 kg   SpO2 97%   BMI 37.03 kg/m   Physical Exam Vitals signs and nursing note reviewed.  Constitutional:      Appearance: Normal appearance. He is well-developed.  HENT:     Head: Normocephalic.     Right Ear: Tympanic membrane normal.     Nose: Nose normal.  Eyes:     General: No scleral icterus.    Conjunctiva/sclera: Conjunctivae normal.  Neck:     Musculoskeletal: Neck supple.     Thyroid: No thyromegaly.  Cardiovascular:     Heart sounds: No murmur. No friction rub. No gallop.      Comments: Rapid irregular rate Pulmonary:     Breath sounds: No stridor. No wheezing or rales.  Chest:     Chest wall: No tenderness.  Abdominal:     General: There is no distension.     Tenderness: There is no abdominal tenderness. There is no rebound.  Musculoskeletal: Normal range of motion.  Lymphadenopathy:     Cervical: No cervical adenopathy.  Skin:    Findings: No erythema or rash.  Neurological:     Mental Status: He is alert.     Motor: No abnormal muscle tone.     Coordination: Coordination normal.     Comments: Patient oriented to person place but not situation.  Psychiatric:        Mood and Affect: Mood normal.      ED Treatments / Results  Labs (all labs ordered are listed, but only abnormal results are displayed) Labs Reviewed  CBC - Abnormal; Notable for the following components:      Result Value   WBC 1.9 (*)    RBC 4.02 (*)    MCV  104.7 (*)    MCH 35.1 (*)    Platelets 48 (*)    All other components within normal limits   COMPREHENSIVE METABOLIC PANEL - Abnormal; Notable for the following components:   Potassium 3.0 (*)    Glucose, Bld 166 (*)    Calcium 8.6 (*)    Albumin 3.3 (*)    AST 52 (*)    Alkaline Phosphatase 131 (*)    Total Bilirubin 2.5 (*)    All other components within normal limits  TROPONIN I - Abnormal; Notable for the following components:   Troponin I 0.05 (*)    All other components within normal limits  AMMONIA - Abnormal; Notable for the following components:   Ammonia 165 (*)    All other components within normal limits  PROTIME-INR - Abnormal; Notable for the following components:   Prothrombin Time 16.6 (*)    INR 1.4 (*)    All other components within normal limits  SARS CORONAVIRUS 2 (HOSPITAL ORDER, [place] [facility])    EKG EKG Interpretation  Date/Time:  Sunday Jan 21 2019 22:45:40 EDT Ventricular Rate:  185 PR Interval:    QRS Duration: 78 QT Interval:  238 QTC Calculation: 418 R Axis:   35 Text Interpretation:  Atrial fibrillation with rapid V-rate Paired ventricular premature complexes Repolarization abnormality, prob rate related Confirmed by Milton Ferguson [phone]) on 01/21/2019 11:14:37 PM   Radiology Dg Chest Port 1 View  Result Date: 01/21/2019 CLINICAL DATA:  Seizure, shortness of breath EXAM: PORTABLE CHEST 1 VIEW COMPARISON:  04/24/2018 FINDINGS: Cardiomegaly with vascular congestion. Suspect mild interstitial edema. No effusions. No acute bony abnormality. IMPRESSION: Cardiomegaly, mild interstitial edema. Electronically Signed   By: Rolm Baptise M.D.   On: 01/21/2019 23:12    Procedures Procedures (including critical care time)  Medications Ordered in ED Medications  diltiazem (CARDIZEM) 1 mg/mL load via infusion 20 mg (20 mg Intravenous Bolus from Bag 01/21/19 2257)    And  diltiazem (CARDIZEM) 100 mg in dextrose 5 % 100 mL (1 mg/mL) infusion (10 mg/hr Intravenous Rate/Dose Verify 01/21/19 2318)  0.9 %  sodium chloride  infusion (10 mL/hr Intravenous New Bag/Given 01/21/19 2306)  potassium chloride SA (K-DUR) CR tablet 40 mEq (has no administration in time range)  lactulose (CHRONULAC) 10 GM/15ML solution 30 g (has no administration in time range)  sodium chloride 0.9 % bolus 500 mL (0 mLs Intravenous Stopped 01/21/19 2338)  metoprolol tartrate (LOPRESSOR) injection 5 mg (5 mg Intravenous Given 01/21/19 2317)  diltiazem (CARDIZEM) injection 20 mg (20 mg Intravenous Bolus 01/21/19 2340)     Initial Impression / Assessment and Plan / ED Course  I have reviewed the triage vital signs and the nursing notes.  Pertinent labs & imaging results that were available during my care of the patient were reviewed by me and considered in my medical decision making (see chart for details).    CRITICAL CARE Performed by: Milton Ferguson Total critical care time:minutes Critical care time was exclusive of separately billable procedures and treating other patients. Critical care was necessary to treat or prevent imminent or life-threatening deterioration. Critical care was time spent personally by me on the following activities: development of treatment plan with patient and/or surrogate as well as nursing, discussions with consultants, evaluation of patient's response to treatment, examination of patient, obtaining history from patient or surrogate, ordering and performing treatments and interventions, ordering and review of laboratory studies, ordering and review of radiographic studies, pulse oximetry  and re-evaluation of patient's condition.   Patient with rapid atrial fib.  Patient improved with Cardizem bolus twice Cardizem drip and Lopressor IV.  Patient also has severely high ammonia level from his cirrhosis.  Is unsure whether he actually had a seizure today.  He will be admitted to medicine for the rapid A. fib and cirrhosis    Final Clinical Impressions(s) / ED Diagnoses   Final diagnoses:  Atrial fibrillation with  RVR [facility])    ED Discharge Orders    None       Milton Ferguson, MD 01/21/19 2359

## 2019-01-21 NOTE — ED Notes (Signed)
Date and time results received: 01/21/19 2333  Test: Troponin Critical Value: 0.05  Name of Provider Notified: Roderic Palau, MD

## 2019-01-22 ENCOUNTER — Inpatient Hospital Stay (HOSPITAL_COMMUNITY)
Admit: 2019-01-22 | Discharge: 2019-01-22 | Disposition: A | Discharge status: Home / Self Care | Payer: Self-pay | Attending: Family Medicine | Admitting: Family Medicine

## 2019-01-22 ENCOUNTER — Encounter (HOSPITAL_COMMUNITY): Payer: Self-pay | Admitting: *Deleted

## 2019-01-22 ENCOUNTER — Inpatient Hospital Stay (HOSPITAL_COMMUNITY): Payer: Self-pay

## 2019-01-22 DIAGNOSIS — I361 Nonrheumatic tricuspid (valve) insufficiency: Secondary | ICD-10-CM

## 2019-01-22 DIAGNOSIS — R4182 Altered mental status, unspecified: Secondary | ICD-10-CM

## 2019-01-22 DIAGNOSIS — I4891 Unspecified atrial fibrillation: Secondary | ICD-10-CM

## 2019-01-22 DIAGNOSIS — E785 Hyperlipidemia, unspecified: Secondary | ICD-10-CM

## 2019-01-22 DIAGNOSIS — K7460 Unspecified cirrhosis of liver: Secondary | ICD-10-CM

## 2019-01-22 DIAGNOSIS — R079 Chest pain, unspecified: Secondary | ICD-10-CM

## 2019-01-22 DIAGNOSIS — I10 Essential (primary) hypertension: Secondary | ICD-10-CM

## 2019-01-22 DIAGNOSIS — D696 Thrombocytopenia, unspecified: Secondary | ICD-10-CM

## 2019-01-22 DIAGNOSIS — R55 Syncope and collapse: Secondary | ICD-10-CM

## 2019-01-22 DIAGNOSIS — I248 Other forms of acute ischemic heart disease: Secondary | ICD-10-CM

## 2019-01-22 HISTORY — DX: Unspecified atrial fibrillation: I48.91

## 2019-01-22 LAB — CBC
HCT: 38.9 % — ABNORMAL LOW (ref 39.0–52.0)
Hemoglobin: 12.6 g/dL — ABNORMAL LOW (ref 13.0–17.0)
MCH: 34.5 pg — ABNORMAL HIGH (ref 26.0–34.0)
MCHC: 32.4 g/dL (ref 30.0–36.0)
MCV: 106.6 fL — ABNORMAL HIGH (ref 80.0–100.0)
Platelets: 57 10*3/uL — ABNORMAL LOW (ref 150–400)
RBC: 3.65 MIL/uL — ABNORMAL LOW (ref 4.22–5.81)
RDW: 14.1 % (ref 11.5–15.5)
WBC: 4.3 10*3/uL (ref 4.0–10.5)
nRBC: 0 % (ref 0.0–0.2)

## 2019-01-22 LAB — COMPREHENSIVE METABOLIC PANEL
ALT: 21 U/L (ref 0–44)
AST: 44 U/L — ABNORMAL HIGH (ref 15–41)
Albumin: 2.7 g/dL — ABNORMAL LOW (ref 3.5–5.0)
Alkaline Phosphatase: 95 U/L (ref 38–126)
Anion gap: 7 (ref 5–15)
BUN: 7 mg/dL (ref 6–20)
CO2: 22 mmol/L (ref 22–32)
Calcium: 8.2 mg/dL — ABNORMAL LOW (ref 8.9–10.3)
Chloride: 113 mmol/L — ABNORMAL HIGH (ref 98–111)
Creatinine, Ser: 0.59 mg/dL — ABNORMAL LOW (ref 0.61–1.24)
GFR calc Af Amer: >60 mL/min (ref >60)
GFR calc non Af Amer: >60 mL/min (ref >60)
Glucose, Bld: 110 mg/dL — ABNORMAL HIGH (ref 70–99)
Potassium: 4.3 mmol/L (ref 3.5–5.1)
Sodium: 142 mmol/L (ref 135–145)
Total Bilirubin: 2.1 mg/dL — ABNORMAL HIGH (ref 0.3–1.2)
Total Protein: 5.7 g/dL — ABNORMAL LOW (ref 6.5–8.1)

## 2019-01-22 LAB — ECHOCARDIOGRAM COMPLETE
Height: 67 in
Weight: 3541.47 oz

## 2019-01-22 LAB — SARS CORONAVIRUS 2 BY RT PCR (HOSPITAL ORDER, PERFORMED IN ~~LOC~~ HOSPITAL LAB): SARS Coronavirus 2: NEGATIVE

## 2019-01-22 LAB — TROPONIN I
Troponin I: 0.36 ng/mL (ref <0.03)
Troponin I: 0.68 ng/mL (ref <0.03)
Troponin I: 0.59 ng/mL (ref <0.03)

## 2019-01-22 LAB — TSH: TSH: 1 u[IU]/mL (ref 0.350–4.500)

## 2019-01-22 LAB — AMMONIA: Ammonia: 131 umol/L — ABNORMAL HIGH (ref 9–35)

## 2019-01-22 LAB — MRSA PCR SCREENING: MRSA by PCR: NEGATIVE

## 2019-01-22 MED ORDER — PNEUMOCOCCAL VAC POLYVALENT 25 MCG/0.5ML IJ INJ
0.5000 mL | INJECTION | INTRAMUSCULAR | Status: AC
  Administered 2019-01-23: 0.5 mL via INTRAMUSCULAR
  Filled 2019-01-22: qty 0.5

## 2019-01-22 MED ORDER — PANTOPRAZOLE SODIUM 40 MG IV SOLR
40.0000 mg | INTRAVENOUS | Status: DC
  Administered 2019-01-22 – 2019-01-23 (×2): 40 mg via INTRAVENOUS
  Filled 2019-01-22 (×2): qty 40

## 2019-01-22 MED ORDER — METOPROLOL TARTRATE 50 MG PO TABS
50.0000 mg | ORAL_TABLET | Freq: Three times a day (TID) | ORAL | Status: DC
  Administered 2019-01-22 (×2): 50 mg via ORAL
  Filled 2019-01-22 (×2): qty 1

## 2019-01-22 MED ORDER — CHLORHEXIDINE GLUCONATE CLOTH 2 % EX PADS: 6.0000 | MEDICATED_PAD | Freq: Every day | CUTANEOUS | Status: DC

## 2019-01-22 MED ORDER — RIFAXIMIN 550 MG PO TABS
550.0000 mg | ORAL_TABLET | Freq: Two times a day (BID) | ORAL | Status: DC
  Administered 2019-01-22 – 2019-01-23 (×4): 550 mg via ORAL
  Filled 2019-01-22 (×4): qty 1

## 2019-01-22 MED ORDER — LORAZEPAM 2 MG/ML IJ SOLN: 2.0000 mg | INTRAMUSCULAR | Status: DC | PRN

## 2019-01-22 MED ORDER — ACETAMINOPHEN 325 MG PO TABS: 650.0000 mg | ORAL_TABLET | Freq: Four times a day (QID) | ORAL | Status: DC | PRN

## 2019-01-22 MED ORDER — LACTULOSE 10 GM/15ML PO SOLN
60.0000 g | Freq: Once | ORAL | Status: AC
  Administered 2019-01-22: 60 g via ORAL
  Filled 2019-01-22: qty 90

## 2019-01-22 MED ORDER — LACTULOSE 10 GM/15ML PO SOLN
30.0000 g | Freq: Three times a day (TID) | ORAL | Status: DC
  Administered 2019-01-22 – 2019-01-23 (×3): 30 g via ORAL
  Filled 2019-01-22 (×3): qty 60

## 2019-01-22 MED ORDER — SODIUM CHLORIDE 0.9% FLUSH: 3.0000 mL | INTRAVENOUS | Status: DC | PRN

## 2019-01-22 MED ORDER — ONDANSETRON HCL 4 MG PO TABS: 4.0000 mg | ORAL_TABLET | Freq: Four times a day (QID) | ORAL | Status: DC | PRN

## 2019-01-22 MED ORDER — UMECLIDINIUM-VILANTEROL 62.5-25 MCG/INH IN AEPB
1.0000 | INHALATION_SPRAY | Freq: Every day | RESPIRATORY_TRACT | Status: DC
  Administered 2019-01-22: 1 via RESPIRATORY_TRACT
  Filled 2019-01-22: qty 14

## 2019-01-22 MED ORDER — POTASSIUM CHLORIDE 10 MEQ/100ML IV SOLN
10.0000 meq | INTRAVENOUS | Status: AC
  Administered 2019-01-22 (×3): 10 meq via INTRAVENOUS
  Filled 2019-01-22 (×3): qty 100

## 2019-01-22 MED ORDER — METOPROLOL TARTRATE 50 MG PO TABS
100.0000 mg | ORAL_TABLET | Freq: Two times a day (BID) | ORAL | Status: DC
  Administered 2019-01-23: 100 mg via ORAL
  Filled 2019-01-22: qty 2

## 2019-01-22 MED ORDER — SODIUM CHLORIDE 0.9% FLUSH
3.0000 mL | Freq: Two times a day (BID) | INTRAVENOUS | Status: DC
  Administered 2019-01-22 – 2019-01-23 (×4): 3 mL via INTRAVENOUS

## 2019-01-22 MED ORDER — OXYCODONE HCL 5 MG PO TABS
5.0000 mg | ORAL_TABLET | Freq: Four times a day (QID) | ORAL | Status: DC | PRN
  Administered 2019-01-22: 5 mg via ORAL
  Filled 2019-01-22: qty 1

## 2019-01-22 MED ORDER — CHLORHEXIDINE GLUCONATE CLOTH 2 % EX PADS
6.0000 | MEDICATED_PAD | Freq: Every day | CUTANEOUS | Status: DC
  Administered 2019-01-22 – 2019-01-23 (×2): 6 via TOPICAL

## 2019-01-22 MED ORDER — ALUM & MAG HYDROXIDE-SIMETH 200-200-20 MG/5ML PO SUSP
30.0000 mL | ORAL | Status: DC | PRN
  Administered 2019-01-22: 30 mL via ORAL
  Filled 2019-01-22: qty 30

## 2019-01-22 MED ORDER — EZETIMIBE 10 MG PO TABS
10.0000 mg | ORAL_TABLET | Freq: Every day | ORAL | Status: DC
  Administered 2019-01-22 – 2019-01-23 (×2): 10 mg via ORAL
  Filled 2019-01-22 (×2): qty 1

## 2019-01-22 MED ORDER — ONDANSETRON HCL 4 MG/2ML IJ SOLN: 4.0000 mg | Freq: Four times a day (QID) | INTRAMUSCULAR | Status: DC | PRN

## 2019-01-22 MED ORDER — SODIUM CHLORIDE 0.9 % IV SOLN: 250.0000 mL | INTRAVENOUS | Status: DC | PRN

## 2019-01-22 NOTE — Progress Notes (Signed)
Patient seen and evaluated, chart reviewed, please see EMR for updated orders. Please see full H&P dictated by admitting physician Dr Darrick Meigs for same date of service.    Possible SZ--- EEG without epileptiform findings, use lorazepam PRN  A. fib with RVR----converted back to sinus rhythm off IV Cardizem drip at this time cardiology consult appreciated titrating metoprolol up to 100 mg twice daily  Hepatic Encephalopathy--- increase lactulose to achieve at least 3 bowel movements a day, continue rifaximin, on admission ammonia was 165   Patient seen and evaluated, chart reviewed, please see EMR for updated orders. Please see full H&P dictated by admitting physician Dr Darrick Meigs for same date of service.

## 2019-01-22 NOTE — Procedures (Signed)
ELECTROENCEPHALOGRAM REPORT   Patient: [name] [name]       Room #: UT65 EEG No. ID: [id number] Age: 58 y.o.        Sex: male Referring Physician: Denton Brick Report Date:  01/22/2019        Interpreting Physician: Alexis Goodell  History: [name] is an 58 y.o. male with a history of seizure presenting with possible seizure  Medications:  Zetia, Chronulac, Lopressor, Xifaxan, Anoro Ellipta  Conditions of Recording:  This is a 21 channel routine scalp EEG performed with bipolar and monopolar montages arranged in accordance to the international 10/20 system of electrode placement. One channel was dedicated to EKG recording.  The patient is in the awake and drowsy states.  Description:  The waking background activity consists of a low voltage, symmetrical, fairly well organized, 6-7 Hz theta activity, seen from the parieto-occipital and posterior temporal regions.  Low voltage fast activity, poorly organized, is seen anteriorly and is at times superimposed on more posterior regions.  A mixture of theta and alpha rhythms are seen from the central and temporal regions. The patient drowses with slowing to irregular, low voltage theta and beta activity.   Stage II sleep is not obtained. No epileptiform activity is noted.   Hyperventilation and intermittent photic stimulation were not performed.   IMPRESSION: This is an abnormal EEG secondary to posterior background slowing.  This finding may be seen with a diffuse gray matter disturbance that is etiologically nonspecific, but may include a dementia or an encephalopathy, among other possibilities.  No epileptiform activity is noted.     Alexis Goodell, MD Neurology [phone] 01/22/2019, 1:23 PM

## 2019-01-22 NOTE — TOC Initial Note (Signed)
Transition of Care [facility]) - Initial/Assessment Note    Patient Details  Name: [name] [name] MRN: [id number] Date of Birth: 06-07-1961  [facility]) CM/SW Contact:    Shade Flood, LCSW Phone Number: 01/22/2019, 11:51 AM  Clinical Narrative:                  Spoke with pt's wife by phone to assess. Wife states pt is mostly independent in ADLs at home. She says he has a cane and a walker at home. Pt has had [facility] multiple times in the last several years. Will arrange again if needed at dc. Pt's wife states that they do not have any difficulty getting pt to medical appointments or with obtaining his medications.   Pt's wife anticipating pt will return home with her at dc. Will follow for TOC needs.  Expected Discharge Plan: [place] Barriers to Discharge: Continued Medical Work up   Patient Goals and CMS Choice        Expected Discharge Plan and Services Expected Discharge Plan: [place]       Living arrangements for the past 2 months: Apartment                                      Prior Living Arrangements/Services Living arrangements for the past 2 months: Apartment Lives with:: Spouse Patient language and need for interpreter reviewed:: Yes Do you feel safe going back to the place where you live?: Yes      Need for Family Participation in Patient Care: Yes (Comment) Care giver support system in place?: Yes (comment)   Criminal Activity/Legal Involvement Pertinent to Current Situation/Hospitalization: No - Comment as needed  Activities of Daily Living Home Assistive Devices/Equipment: Environmental consultant (specify type), Cane (specify quad or straight), Eyeglasses, Shower chair with back ADL Screening (condition at time of admission) Patient's cognitive ability adequate to safely complete daily activities?: No Is the patient deaf or have difficulty hearing?: Yes Does the patient have difficulty seeing, even when  wearing glasses/contacts?: Yes Does the patient have difficulty concentrating, remembering, or making decisions?: Yes Patient able to express need for assistance with ADLs?: Yes Does the patient have difficulty dressing or bathing?: Yes Independently performs ADLs?: No Communication: Independent Dressing (OT): Needs assistance Is this a change from baseline?: Pre-admission baseline Grooming: Needs assistance Is this a change from baseline?: Pre-admission baseline Feeding: Independent Bathing: Needs assistance Is this a change from baseline?: Pre-admission baseline Toileting: Needs assistance Is this a change from baseline?: Pre-admission baseline In/Out Bed: Needs assistance Is this a change from baseline?: Pre-admission baseline Walks in Home: Independent with device (comment) Does the patient have difficulty walking or climbing stairs?: Yes Weakness of Legs: Both Weakness of Arms/Hands: Both  Permission Sought/Granted                  Emotional Assessment Appearance:: Appears stated age     Orientation: : Oriented to Self, Oriented to Place, Oriented to Situation Alcohol / Substance Use: Not Applicable Psych Involvement: No (comment)  Admission diagnosis:  Atrial fibrillation with RVR ([place]) [I48.91] Patient Active Problem List   Diagnosis Date Noted  . Atrial fibrillation with RVR ([place]) 01/22/2019  . Chronic pain syndrome   . Cellulitis of right lower extremity 02/17/2018  . Dysphagia 10/10/2017  . Encounter for screening colonoscopy 10/10/2017  . Palliative care encounter   .  Goals of care, counseling/discussion   . DNR (do not resuscitate) discussion   . Encounter for hospice care discussion   . Altered mental status 09/11/2017  . Acute encephalopathy 12/29/2016  . Subarachnoid hemorrhage ([place]) 12/29/2016  . Bacteremia 12/29/2016  . Fall 12/21/2016  . Cerebral contusion ([place]) 12/21/2016  . Pressure injury of skin 12/14/2016  . Anxiety   . Suicidal  ideation   . Acute hepatic encephalopathy ([place]) 08/02/2013  . Hypokalemia 02/01/2013  . Hyperammonemia ([place]) 01/31/2013  . Urinary incontinence 01/31/2013  . Pancytopenia ([place]) 09/17/2012  . Edema of right lower extremity 04/28/2012  . Hepatic encephalopathy ([place]) 04/28/2012  . Arthritis of ankle or foot, degenerative 04/26/2012  . Hematemesis 12/07/2011  . Thrombocytopenia ([place]) 12/07/2011  . Anemia 12/07/2011  . COPD (chronic obstructive pulmonary disease) ([place]) 08/25/2011  . DM (diabetes mellitus) ([place]) 08/25/2011  . HTN (hypertension) 08/25/2011  . Coagulopathy ([place]) 08/25/2011  . Cirrhosis ([place]) 08/25/2011  . Depression 08/25/2011  . Seizure disorder ([place]) 08/25/2011  . GERD (gastroesophageal reflux disease) 08/25/2011  . Tobacco abuse 08/25/2011  . Ankle fracture, bimalleolar, closed 08/04/2011   PCP:  Hermine Messick, MD Pharmacy:   [place], [address] [place] [address] Phone: [phone] Fax: [phone]     Social Determinants of Health (SDOH) Interventions    Readmission Risk Interventions No flowsheet data found.

## 2019-01-22 NOTE — H&P (Signed)
TRH H&P    Patient Demographics:    [name] [name], is a 58 y.o. male  MRN: [id number]  DOB - 1960/09/23  Admit Date - 01/21/2019  Referring MD/NP/PA: Dr. Roderic Palau  Outpatient Primary MD for the patient is Hermine Messick, MD  Patient coming from: Home  Chief complaint-seizure   HPI:    [name] [name]  is a 58 y.o. male, with history of liver cirrhosis, seizure disorder, thrombocytopenia, history of hepatitis, hepatic encephalopathy, type 2 diabetes mellitus, COPD, dementia, CVA, chronic systolic heart failure, hypertension was brought to the ED by EMS for questionable seizure.  Patient is pleasantly confused unable to provide significant history.  I called patient's wife and as per patient wife patient went to bathroom and as he came out of the bathroom he fell on the floor and was acting very weird.  He was not responding though he was alert.  Unclear details.  There was no shaking of limbs.  This episode lasted for about 10 minutes as per patient's wife. At this time in the ED patient is alert, communicating.  He knows he is in the hospital and knows that he was brought to hospital for possible seizure.  In the ED, patient was found to be in A. fib with RVR.  Started on IV Cardizem drip. He has no history of A. fib in the past. Also found to have mild elevation of troponin 0.05, potassium 3.0. Ammonia level 165 SARS-CoV-2 is negative   Review of systems:    In addition to the HPI above,   No other review of systems obtainable as patient is not very cooperative with history.    Past History of the following :    Past Medical History:  Diagnosis Date  . Anemia   . Ankle deformity, right    chronic  . Anxiety   . Arthritis   . Bipolar 1 disorder ([place])   . Blind right eye   . Blood clots in brain    per patient  . Cardiomegaly   . Cataract    Right eye  . Cerebrovascular disease    Right  vertebral artery  . Cholelithiasis    Asymptomatic  . Chronic back pain   . Chronic pain   . 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 Cirrhosis ([place])    diagnosed 07/2010 when presented with first episode of hepatic encephalopathy, afp on 416/13=4.8,  U/S on 12/14/11  liver stable, pt has received 2 hep A/B vaccines  . COPD (chronic obstructive pulmonary disease) ([place])   . Dementia ([place])   . Depression   . Diabetes mellitus   . Diverticulosis   . Dyspnea   . GERD (gastroesophageal reflux disease)   . Hard of hearing   . Hepatic encephalopathy ([place])   . Hepatitis   . Hyperlipidemia   . Hypertension   . Lymphedema    R>L  . Mental retardation   . Noncompliance   . Seizures ([place])   . Thrombocytopenia ([place])       Past Surgical History:  Procedure Laterality Date  . CAST APPLICATION  01/22/6072   Procedure: MINOR CAST APPLICATION;  Surgeon: [name], MD;  Location: [facility];  Service: Orthopedics;  Laterality: Right;  no anesthesia , procedure room do not need or room !  . COLONOSCOPY  12/15/11   colonic divericulosis;poor prep, ACBE  recommended but has not been done  . COLONOSCOPY WITH PROPOFOL N/A 11/07/2017   Dr. Gala Romney: inadequate prep, internal hemorrhoids. Early interval due March 2020 due to poor prep  . ear operations    . ESOPHAGOGASTRODUODENOSCOPY  02/02/2011   Rourk-Normal esophagus.  No varices/Question mild portal gastropathy, extrinsic compression on the antrum, lesser curvature of uncertain significance, some minimally  nodular mucosa status post biopsy, patent pylorus, normal duodenum 1 and duodenum 2.  . ESOPHAGOGASTRODUODENOSCOPY  12/15/11   Rourk-->mild changes of portal gastropathy, gastric/duodenal erosions s/p bx  (reactive changes with mild chronic inflammation. No H.pylori  . ESOPHAGOGASTRODUODENOSCOPY (EGD) WITH PROPOFOL N/A 11/07/2017   Dr. Gala Romney: normal esophagus s/p dilation, portal hypertensive gastropathy, erosive gastropathy, normal duodenum  . FOOT  SURGERY    . MALONEY DILATION N/A 11/07/2017   Procedure: Venia Minks DILATION;  Surgeon: Daneil Dolin, MD;  Location: [facility];  Service: Endoscopy;  Laterality: N/A;  . ORIF ANKLE FRACTURE  08/27/2011   Procedure: OPEN REDUCTION INTERNAL FIXATION (ORIF) ANKLE FRACTURE;  Surgeon: [name], MD;  Location: [facility];  Service: Orthopedics;  Laterality: Right;  . TONSILLECTOMY        Social History:      Social History   Tobacco Use  . Smoking status: Current Every Day Smoker    Packs/day: 1.00    Years: 30.00    Pack years: 30.00    Types: Cigarettes  . Smokeless tobacco: Never Used  . Tobacco comment: only smokes about 1-2 a day  Substance Use Topics  . Alcohol use: No       Family History :     Family History  Problem Relation Age of Onset  . Heart failure Mother   . Thyroid disease Mother   . Cancer Father   . Asthma Brother   . Heart attack Brother   . Cirrhosis Brother        EtOH  . Colon cancer Maternal Aunt       Home Medications:   Prior to Admission medications   Medication Sig Start Date End Date Taking? Authorizing Provider  albuterol (PROVENTIL HFA;VENTOLIN HFA) 108 (90 Base) MCG/ACT inhaler Inhale 2 puffs into the lungs every 4 (four) hours as needed for wheezing or shortness of breath.    [provider]  ALPRAZolam Duanne Moron) 0.5 MG tablet Take 1 tablet by mouth 3 (three) times daily as needed. 01/23/18   [provider]  ammonium lactate (AMLACTIN) 12 % cream Apply 1 application topically daily as needed. 03/27/18   [provider]  ezetimibe (ZETIA) 10 MG tablet Take 10 mg by mouth daily.    [provider]  furosemide (LASIX) 20 MG tablet Take 20 mg by mouth daily.    [provider]  lactulose (CHRONULAC) 10 GM/15ML solution Take 60 mLs (40 g total) by mouth 3 (three) times daily. Titrate for 3-4 bowel motions daily. Patient taking differently: Take 20 g by mouth 3 (three) times daily. Titrate for 3-4  bowel motions daily. 12/15/16   Elgergawy, Silver Huguenin, MD  metoprolol tartrate (LOPRESSOR) 50 MG tablet Take 50 mg by mouth 3 (three) times daily.     [provider]  oxyCODONE (OXY IR/ROXICODONE) 5 MG immediate release tablet Take 1 tablet by mouth 3 (three) times daily.  10/04/17   [provider]  pantoprazole (PROTONIX) 40 MG tablet Take 40 mg by mouth daily.    [provider]  potassium chloride SA (K-DUR,KLOR-CON) 20 MEQ tablet Take 20 mEq by mouth daily.     [provider]  QUEtiapine (SEROQUEL) 25 MG tablet Take 1 tablet (25 mg total) by mouth at bedtime. Patient taking differently: Take 50 mg by mouth at bedtime.  12/31/16   Samuella Cota, MD  rifaximin (XIFAXAN) 550 MG TABS tablet Take 1 tablet (550 mg total) by mouth 2 (two) times daily. 12/31/16   Samuella Cota, MD  saccharomyces boulardii (FLORASTOR) 250 MG capsule Take 1 capsule (250 mg total) by mouth 2 (two) times daily. 02/18/18   Barton Dubois, MD  silver sulfADIAZINE (SILVADENE) 1 % cream Apply 1 application topically daily. As directed 01/06/18   [provider]  traZODone (DESYREL) 50 MG tablet Take 0.5 tablets by mouth at bedtime as needed. 02/20/18   [provider]  umeclidinium-vilanterol (ANORO ELLIPTA) 62.5-25 MCG/INH AEPB Inhale 1 puff into the lungs daily. 10/06/17   [provider]     Allergies:     Allergies  Allergen Reactions  . Penicillins Rash    Has patient had a PCN reaction causing immediate rash, facial/tongue/throat swelling, SOB or lightheadedness with hypotension: Yes Has patient had a PCN reaction causing severe rash involving mucus membranes or skin necrosis: No Has patient had a PCN reaction that required hospitalization No Has patient had a PCN reaction occurring within the last 10 years: No If all of the above answers are "NO", then may proceed with Cephalosporin use.      Physical Exam:   Vitals  Blood pressure 130/79,  pulse 93, temperature 98.2 F (36.8 C), temperature source Oral, resp. rate 19, height 5' 7.5" (1.715 m), weight 108.9 kg, SpO2 95 %.  1.  General: Appears in no acute distress  2. Psychiatric: Alert, oriented x2, easily distracted  3. Neurologic: Cranial nerves II to XII grossly intact, moving all extremities, no focal deficit noted  4. HEENMT:  Atraumatic normocephalic  5. Respiratory : Clear to auscultation bilaterally, no wheezing or crackles  6. Cardiovascular : S1-S2, regular, no murmur auscultated, no edema in the lower extremities  7. Gastrointestinal:  Abdomen is soft, nontender, no organomegaly     Data Review:    CBC Recent Labs  Lab 01/21/19 2250  WBC 1.9*  HGB 14.1  HCT 42.1  PLT 48*  MCV 104.7*  MCH 35.1*  MCHC 33.5  RDW 14.2   ------------------------------------------------------------------------------------------------------------------  Results for orders placed or performed during the hospital encounter of 01/21/19 (from the past 48 hour(s))  CBC     Status: Abnormal   Collection Time: 01/21/19 10:50 PM  Result Value Ref Range   WBC 1.9 (L) 4.0 - 10.5 K/uL   RBC 4.02 (L) 4.22 - 5.81 MIL/uL   Hemoglobin 14.1 13.0 - 17.0 g/dL   HCT 42.1 39.0 - 52.0 %   MCV 104.7 (H) 80.0 - 100.0 fL   MCH 35.1 (H) 26.0 - 34.0 pg   MCHC 33.5 30.0 - 36.0 g/dL   RDW 14.2 11.5 - 15.5 %   Platelets 48 (L) 150 - 400 K/uL    Comment: Immature Platelet Fraction may be clinically indicated, consider ordering this additional test [id number]    nRBC 0.0 0.0 - 0.2 %  Comment: Performed at [facility], [address] [place], [place] [address]  Comprehensive metabolic panel     Status: Abnormal   Collection Time: 01/21/19 10:50 PM  Result Value Ref Range   Sodium 141 135 - 145 mmol/L   Potassium 3.0 (L) 3.5 - 5.1 mmol/L   Chloride 107 98 - 111 mmol/L   CO2 22 22 - 32 mmol/L   Glucose, Bld 166 (H) 70 - 99 mg/dL   BUN 7 6 - 20 mg/dL   Creatinine, Ser 0.70  0.61 - 1.24 mg/dL   Calcium 8.6 (L) 8.9 - 10.3 mg/dL   Total Protein 7.0 6.5 - 8.1 g/dL   Albumin 3.3 (L) 3.5 - 5.0 g/dL   AST 52 (H) 15 - 41 U/L   ALT 25 0 - 44 U/L   Alkaline Phosphatase 131 (H) 38 - 126 U/L   Total Bilirubin 2.5 (H) 0.3 - 1.2 mg/dL   GFR calc non Af Amer >60 >60 mL/min   GFR calc Af Amer >60 >60 mL/min   Anion gap 12 5 - 15    Comment: Performed at [facility], [address] [place], [place] [address]  Troponin I - ONCE - STAT     Status: Abnormal   Collection Time: 01/21/19 10:50 PM  Result Value Ref Range   Troponin I 0.05 (HH) <0.03 ng/mL    Comment: CRITICAL RESULT CALLED TO, READ BACK BY AND VERIFIED WITH: WALKER,T @ 2333 ON 01/21/19 BY JUW Performed at [facility], [address], [place], [place] [address]   Ammonia     Status: Abnormal   Collection Time: 01/21/19 10:50 PM  Result Value Ref Range   Ammonia 165 (H) 9 - 35 umol/L    Comment: Performed at [facility], [address] [place], [place] [address]  Protime-INR     Status: Abnormal   Collection Time: 01/21/19 10:50 PM  Result Value Ref Range   Prothrombin Time 16.6 (H) 11.4 - 15.2 seconds   INR 1.4 (H) 0.8 - 1.2    Comment: (NOTE) INR goal varies based on device and disease states. Performed at [facility], [address] [place], [place] [address]   SARS Coronavirus 2 (CEPHEID - Performed in [facility] hospital lab), Hosp Order     Status: None   Collection Time: 01/21/19 11:21 PM  Result Value Ref Range   SARS Coronavirus 2 NEGATIVE NEGATIVE    Comment: (NOTE) If result is NEGATIVE SARS-CoV-2 target nucleic acids are NOT DETECTED. The SARS-CoV-2 RNA is generally detectable in upper and lower  respiratory specimens during the acute phase of infection. The lowest  concentration of SARS-CoV-2 viral copies this assay can detect is 250  copies / mL. A negative result does not preclude SARS-CoV-2 infection  and should not be used as the sole basis for treatment or other  patient  management decisions.  A negative result may occur with  improper specimen collection / handling, submission of specimen other  than nasopharyngeal swab, presence of viral mutation(s) within the  areas targeted by this assay, and inadequate number of viral copies  (<250 copies / mL). A negative result must be combined with clinical  observations, patient history, and epidemiological information. If result is POSITIVE SARS-CoV-2 target nucleic acids are DETECTED. The SARS-CoV-2 RNA is generally detectable in upper and lower  respiratory specimens dur ing the acute phase of infection.  Positive  results are indicative of active infection with SARS-CoV-2.  Clinical  correlation with patient  history and other diagnostic information is  necessary to determine patient infection status.  Positive results do  not rule out bacterial infection or co-infection with other viruses. If result is PRESUMPTIVE POSTIVE SARS-CoV-2 nucleic acids MAY BE PRESENT.   A presumptive positive result was obtained on the submitted specimen  and confirmed on repeat testing.  While 2019 novel coronavirus  (SARS-CoV-2) nucleic acids may be present in the submitted sample  additional confirmatory testing may be necessary for epidemiological  and / or clinical management purposes  to differentiate between  SARS-CoV-2 and other Sarbecovirus currently known to infect humans.  If clinically indicated additional testing with an alternate test  methodology [phone]) is advised. The SARS-CoV-2 RNA is generally  detectable in upper and lower respiratory sp ecimens during the acute  phase of infection. The expected result is Negative. Fact Sheet for Patients:  StrictlyIdeas.no Fact Sheet for Healthcare Providers: BankingDealers.co.za This test is not yet approved or cleared by the [place] FDA and has been authorized for detection and/or diagnosis of SARS-CoV-2 by FDA under  an Emergency Use Authorization (EUA).  This EUA will remain in effect (meaning this test can be used) for the duration of the COVID-19 declaration under Section 564(b)(1) of the Act, 21 U.S.C. section 360bbb-3(b)(1), unless the authorization is terminated or revoked sooner. Performed at [facility], [address], [place], [place] [address]     Chemistries  Recent Labs  Lab 01/21/19 2250  NA 141  K 3.0*  CL 107  CO2 22  GLUCOSE 166*  BUN 7  CREATININE 0.70  CALCIUM 8.6*  AST 52*  ALT 25  ALKPHOS 131*  BILITOT 2.5*   ------------------------------------------------------------------------------------------------------------------  ------------------------------------------------------------------------------------------------------------------ GFR: Estimated Creatinine Clearance: 118 mL/min (by C-G formula based on SCr of 0.7 mg/dL). Liver Function Tests: Recent Labs  Lab 01/21/19 2250  AST 52*  ALT 25  ALKPHOS 131*  BILITOT 2.5*  PROT 7.0  ALBUMIN 3.3*   No results for input(s): LIPASE, AMYLASE in the last 168 hours. Recent Labs  Lab 01/21/19 2250  AMMONIA 165*   Coagulation Profile: Recent Labs  Lab 01/21/19 2250  INR 1.4*   Cardiac Enzymes: Recent Labs  Lab 01/21/19 2250  TROPONINI 0.05*   BNP (last 3 results) No results for input(s): PROBNP in the last 8760 hours. HbA1C: No results for input(s): HGBA1C in the last 72 hours. CBG: No results for input(s): GLUCAP in the last 168 hours. Lipid Profile: No results for input(s): CHOL, HDL, LDLCALC, TRIG, CHOLHDL, LDLDIRECT in the last 72 hours. Thyroid Function Tests: No results for input(s): TSH, T4TOTAL, FREET4, T3FREE, THYROIDAB in the last 72 hours. Anemia Panel: No results for input(s): VITAMINB12, FOLATE, FERRITIN, TIBC, IRON, RETICCTPCT in the last 72 hours.  --------------------------------------------------------------------------------------------------------------- Urine analysis:     Component Value Date/Time   COLORURINE YELLOW 04/24/2018 1920   APPEARANCEUR CLEAR 04/24/2018 1920   LABSPEC 1.013 04/24/2018 1920   PHURINE 7.0 04/24/2018 1920   GLUCOSEU NEGATIVE 04/24/2018 1920   HGBUR NEGATIVE 04/24/2018 1920   BILIRUBINUR NEGATIVE 04/24/2018 1920   KETONESUR NEGATIVE 04/24/2018 1920   PROTEINUR NEGATIVE 04/24/2018 1920   UROBILINOGEN 1.0 01/03/2014 1748   NITRITE NEGATIVE 04/24/2018 1920   LEUKOCYTESUR NEGATIVE 04/24/2018 1920      Imaging Results:    Dg Chest Port 1 View  Result Date: 01/21/2019 CLINICAL DATA:  Seizure, shortness of breath EXAM: PORTABLE CHEST 1 VIEW COMPARISON:  04/24/2018 FINDINGS: Cardiomegaly with vascular congestion. Suspect mild interstitial edema. No effusions. No acute bony  abnormality. IMPRESSION: Cardiomegaly, mild interstitial edema. Electronically Signed   By: Rolm Baptise M.D.   On: 01/21/2019 23:12    My personal review of EKG: Rhythm atrial fibrillation with RVR   Assessment & Plan:    Active Problems:   Atrial fibrillation with RVR (HCC)   1. Atrial fibrillation with RVR-patient started on Cardizem in the ED.  We will continue with Cardizem [id number] score is 2-3 (hypertension, peripheral vascular disease, diabetes mellitus history), patient is not a good candidate for anticoagulation at this time due to thrombocytopenia with platelet count 48,000.  Will not start anticoagulation at this time.  INR is 1.4 due to underlying liver cirrhosis.  Started on home medication Lopressor 50 mg p.o. 3 times daily.  Check TSH, echocardiogram in a.m.  Will consult cardiology in a.m. for further recommendations.  2. Syncope versus seizure-unclear details about patient's episode of syncope versus seizure.  Patient is a poor historian and wife also unclear about the details of episode.  Will obtain EEG in a.m.  Put on seizure precautions, Ativan 2 mg IV every 4 hours as needed for seizure.  Also obtain echocardiogram in a.m.  Troponin is  elevated 0.05, will cycle troponin every 6 hours x3.  Check CT head without contrast.  3. Liver cirrhosis-patient has history of liver cirrhosis, also has pancytopenia as per labs.  Will hold Lasix at this time.  No evidence of ascites on physical examination.  4. Hepatic encephalopathy-surprisingly patient is alert oriented x 2, able to answer questions.  Though he is easily distracted.  Ammonia level is elevated 165.  Will increase the dose of lactulose to 30 g p.o. 3 times daily.Continue rifaximin.  5. Hypertension-blood pressure is stable, patient takes Lopressor at home.  We will continue with Lopressor 50 mg p.o. 3 times daily.   DVT Prophylaxis-   SCDs   AM Labs Ordered, also please review Full Orders  Family Communication: Admission, patients condition and plan of care including tests being ordered have been discussed with the patient's wife on phone who indicate understanding and agree with the plan and Code Status.  Code Status: DNR  Admission status: Inpatient: Based on patients clinical presentation and evaluation of above clinical data, I have made determination that patient meets Inpatient criteria at this time.  Time spent in minutes : 60 minutes   Oswald Hillock M.D on 01/22/2019 at 12:38 AM

## 2019-01-22 NOTE — Consult Note (Addendum)
Cardiology Consultation:   Patient ID: [name] [name] MRN: [id number]; DOB: September 17, 1960  Admit date: 01/21/2019 Date of Consult: 01/22/2019  Primary Care Provider: Hermine Messick, MD Primary Cardiologist: Kate Sable, MD  new Primary Electrophysiologist:  None    Patient Profile:   [name] [name] is a 58 y.o. male with a hx of hypertension, DM, HLD, family history of CAD who is being seen today for the evaluation of atrial fibrillation with RVR. At the request of Dr. Darrick Meigs.  History of Present Illness:   Mr. [name] [name] with above history as well as CVA, seizure disorder, dementia, chronic systolic CHF-although I can't find an echo to verify this.  Patient was brought to the ED for questionable seizure.  Patient was apparently coming out of the bathroom and fell to the floor and was acting strangely.  He was found to be in atrial fibrillation with RVR and started on IV diltiazem.   troponin elevation 0.05, 0.36 and 0.68, ammonia level of 165.  COVID 19 test is negative.  Mali Vasc=4 HTN, DM, CVA but not a good candidate for anticoagulation due to thrombocytopenia with platelet count of 48,000.  INR 1.4 due to underlying liver cirrhosis.  Question of syncope versus seizure but details unclear.  CTA head negative.  Patient says he has had fast heart rates for over 15 years.  He notices it mostly when he lays down at night.  He also has chest tightness but cannot tell me when this occurs.  He uses his wife's nitroglycerin every 2 weeks to several months.  He is not very active because of a leg injury.  He says he had chest pain when he first came in but it has resolved.  He says he has a lot of anxiety.  Still smokes 2 to 3 cigarettes daily.  This patient saw Dr. Lia Foyer in 2006 for sinus tachycardia with negative nuclear stress test at that time.  Sinus tachycardia felt secondary to anxiety.  Echo in 2011 normal LV function.  Last seen by Dr. Lattie Haw in 2013 for surgical clearance for left ankle  fracture.  Past Medical History:  Diagnosis Date  . Anemia   . Ankle deformity, right    chronic  . Anxiety   . Arthritis   . Bipolar 1 disorder ([place])   . Blind right eye   . Blood clots in brain    per patient  . Cardiomegaly   . Cataract    Right eye  . Cerebrovascular disease    Right vertebral artery  . Cholelithiasis    Asymptomatic  . Chronic back pain   . Chronic pain   . 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 Cirrhosis ([place])    diagnosed 07/2010 when presented with first episode of hepatic encephalopathy, afp on 416/13=4.8,  U/S on 12/14/11  liver stable, pt has received 2 hep A/B vaccines  . COPD (chronic obstructive pulmonary disease) ([place])   . Dementia ([place])   . Depression   . Diabetes mellitus   . Diverticulosis   . Dyspnea   . GERD (gastroesophageal reflux disease)   . Hard of hearing   . Hepatic encephalopathy ([place])   . Hepatitis   . Hyperlipidemia   . Hypertension   . Lymphedema    R>L  . Mental retardation   . Noncompliance   . Seizures ([place])   . Thrombocytopenia ([place])     Past Surgical History:  Procedure Laterality Date  . CAST APPLICATION  01/22/354  Procedure: MINOR CAST APPLICATION;  Surgeon: [name], MD;  Location: [facility];  Service: Orthopedics;  Laterality: Right;  no anesthesia , procedure room do not need or room !  . COLONOSCOPY  12/15/11   colonic divericulosis;poor prep, ACBE  recommended but has not been done  . COLONOSCOPY WITH PROPOFOL N/A 11/07/2017   Dr. Gala Romney: inadequate prep, internal hemorrhoids. Early interval due March 2020 due to poor prep  . ear operations    . ESOPHAGOGASTRODUODENOSCOPY  02/02/2011   Rourk-Normal esophagus.  No varices/Question mild portal gastropathy, extrinsic compression on the antrum, lesser curvature of uncertain significance, some minimally  nodular mucosa status post biopsy, patent pylorus, normal duodenum 1 and duodenum 2.  . ESOPHAGOGASTRODUODENOSCOPY  12/15/11   Rourk-->mild changes of  portal gastropathy, gastric/duodenal erosions s/p bx  (reactive changes with mild chronic inflammation. No H.pylori  . ESOPHAGOGASTRODUODENOSCOPY (EGD) WITH PROPOFOL N/A 11/07/2017   Dr. Gala Romney: normal esophagus s/p dilation, portal hypertensive gastropathy, erosive gastropathy, normal duodenum  . FOOT SURGERY    . MALONEY DILATION N/A 11/07/2017   Procedure: Venia Minks DILATION;  Surgeon: Daneil Dolin, MD;  Location: [facility];  Service: Endoscopy;  Laterality: N/A;  . ORIF ANKLE FRACTURE  08/27/2011   Procedure: OPEN REDUCTION INTERNAL FIXATION (ORIF) ANKLE FRACTURE;  Surgeon: [name], MD;  Location: [facility];  Service: Orthopedics;  Laterality: Right;  . TONSILLECTOMY       Home Medications:  Prior to Admission medications   Medication Sig Start Date End Date Taking? Authorizing Provider  albuterol (PROVENTIL HFA;VENTOLIN HFA) 108 (90 Base) MCG/ACT inhaler Inhale 2 puffs into the lungs every 4 (four) hours as needed for wheezing or shortness of breath.    [provider]  ALPRAZolam Duanne Moron) 0.5 MG tablet Take 1 tablet by mouth 3 (three) times daily as needed. 01/23/18   [provider]  ammonium lactate (AMLACTIN) 12 % cream Apply 1 application topically daily as needed. 03/27/18   [provider]  ezetimibe (ZETIA) 10 MG tablet Take 10 mg by mouth daily.    [provider]  furosemide (LASIX) 20 MG tablet Take 20 mg by mouth daily.    [provider]  lactulose (CHRONULAC) 10 GM/15ML solution Take 60 mLs (40 g total) by mouth 3 (three) times daily. Titrate for 3-4 bowel motions daily. Patient taking differently: Take 20 g by mouth 3 (three) times daily. Titrate for 3-4 bowel motions daily. 12/15/16   Elgergawy, Silver Huguenin, MD  metoprolol tartrate (LOPRESSOR) 50 MG tablet Take 50 mg by mouth 3 (three) times daily.     [provider]  oxyCODONE (OXY IR/ROXICODONE) 5 MG immediate release tablet Take 1 tablet by mouth 3 (three) times daily.   10/04/17   [provider]  pantoprazole (PROTONIX) 40 MG tablet Take 40 mg by mouth daily.    [provider]  potassium chloride SA (K-DUR,KLOR-CON) 20 MEQ tablet Take 20 mEq by mouth daily.     [provider]  QUEtiapine (SEROQUEL) 25 MG tablet Take 1 tablet (25 mg total) by mouth at bedtime. Patient taking differently: Take 50 mg by mouth at bedtime.  12/31/16   Samuella Cota, MD  rifaximin (XIFAXAN) 550 MG TABS tablet Take 1 tablet (550 mg total) by mouth 2 (two) times daily. 12/31/16   Samuella Cota, MD  saccharomyces boulardii (FLORASTOR) 250 MG capsule Take 1 capsule (250 mg total) by mouth 2 (two) times daily. 02/18/18   Barton Dubois, MD  silver sulfADIAZINE (  SILVADENE) 1 % cream Apply 1 application topically daily. As directed 01/06/18   [provider]  traZODone (DESYREL) 50 MG tablet Take 0.5 tablets by mouth at bedtime as needed. 02/20/18   [provider]  umeclidinium-vilanterol (ANORO ELLIPTA) 62.5-25 MCG/INH AEPB Inhale 1 puff into the lungs daily. 10/06/17   [provider]    Inpatient Medications: Scheduled Meds: . Chlorhexidine Gluconate Cloth  6 each Topical Q0600  . ezetimibe  10 mg Oral Daily  . lactulose  30 g Oral TID  . metoprolol tartrate  50 mg Oral TID  . pantoprazole (PROTONIX) IV  40 mg Intravenous Q24H  . [START ON 01/23/2019] pneumococcal 23 valent vaccine  0.5 mL Intramuscular Tomorrow-1000  . rifaximin  550 mg Oral BID  . sodium chloride flush  3 mL Intravenous Q12H  . umeclidinium-vilanterol  1 puff Inhalation Daily   Continuous Infusions: . sodium chloride 10 mL/hr at 01/22/19 0912  . sodium chloride    . diltiazem (CARDIZEM) infusion 5 mg/hr (01/22/19 0912)   PRN Meds: sodium chloride, acetaminophen, alum & mag hydroxide-simeth, LORazepam, ondansetron **OR** ondansetron (ZOFRAN) IV, oxyCODONE, sodium chloride flush  Allergies:    Allergies  Allergen Reactions  . Penicillins Rash    Has  patient had a PCN reaction causing immediate rash, facial/tongue/throat swelling, SOB or lightheadedness with hypotension: Yes Has patient had a PCN reaction causing severe rash involving mucus membranes or skin necrosis: No Has patient had a PCN reaction that required hospitalization No Has patient had a PCN reaction occurring within the last 10 years: No If all of the above answers are "NO", then may proceed with Cephalosporin use.     Social History:   Social History   Socioeconomic History  . Marital status: Married    Spouse name: Not on file  . Number of children: 0  . Years of education: Not on file  . Highest education level: Not on file  Occupational History  . Occupation: disabled    Fish farm manager: UNEMPLOYED  Social Needs  . Financial resource strain: Not on file  . Food insecurity:    Worry: Not on file    Inability: Not on file  . Transportation needs:    Medical: Not on file    Non-medical: Not on file  Tobacco Use  . Smoking status: Current Every Day Smoker    Packs/day: 1.00    Years: 30.00    Pack years: 30.00    Types: Cigarettes  . Smokeless tobacco: Never Used  . Tobacco comment: only smokes about 1-2 a day  Substance and Sexual Activity  . Alcohol use: No  . Drug use: No  . Sexual activity: Not on file  Lifestyle  . Physical activity:    Days per week: Not on file    Minutes per session: Not on file  . Stress: Not on file  Relationships  . Social connections:    Talks on phone: Not on file    Gets together: Not on file    Attends religious service: Not on file    Active member of club or organization: Not on file    Attends meetings of clubs or organizations: Not on file    Relationship status: Not on file  . Intimate partner violence:    Fear of current or ex partner: Not on file    Emotionally abused: Not on file    Physically abused: Not on file    Forced sexual activity: Not on file  Other  Topics Concern  . Not on file  Social History  Narrative  . Not on file    Family History:    Family History  Problem Relation Age of Onset  . Heart failure Mother   . Thyroid disease Mother   . Cancer Father   . Asthma Brother   . Heart attack Brother   . Cirrhosis Brother        EtOH  . Colon cancer Maternal Aunt      ROS:  Please see the history of present illness.  Review of Systems  Constitution: Positive for malaise/fatigue.  HENT: Negative.   Cardiovascular: Positive for chest pain and irregular heartbeat.  Respiratory: Negative.   Endocrine: Negative.   Hematologic/Lymphatic: Negative.   Musculoskeletal: Positive for joint pain.  Gastrointestinal: Negative.   Genitourinary: Negative.   Neurological: Positive for difficulty with concentration, seizures and weakness.    All other ROS reviewed and negative.     Physical Exam/Data:   Vitals:   01/22/19 0500 01/22/19 0730 01/22/19 0800 01/22/19 0900  BP:   104/67   Pulse:  66 66 60  Resp:  16 14 17   Temp:  98.5 F (36.9 C)    TempSrc:  Oral    SpO2:  91% 92% 92%  Weight: 100.4 kg     Height:        Intake/Output Summary (Last 24 hours) at 01/22/2019 0950 Last data filed at 01/22/2019 0912 Gross per 24 hour  Intake 740.51 ml  Output 400 ml  Net 340.51 ml   Last 3 Weights 01/22/2019 01/22/2019 01/21/2019  Weight (lbs) 221 lb 5.5 oz 221 lb 5.5 oz 240 lb  Weight (kg) 100.4 kg 100.4 kg 108.863 kg     Body mass index is 34.67 kg/m.  General:  Well nourished, well developed, in no acute distress  HEENT: normal Lymph: no adenopathy Neck: no JVD Endocrine:  No thryomegaly Vascular: No carotid bruits; FA pulses 2+ bilaterally without bruits  Cardiac:  normal S1, S2; RRR; 1/6 systolic murmur at the left sternal border Lungs: Decreased breath sounds but clear to auscultation bilaterally, no wheezing, rhonchi or rales  Abd: soft, nontender, no hepatomegaly  Ext: Venous stasis changes, no edema Musculoskeletal:  No deformities, BUE and BLE strength normal and  equal Skin: warm and dry  Neuro:  CNs 2-12 intact, no focal abnormalities noted-patient has elevated ammonia levels and has trouble giving full detailed history. Psych:  Normal affect   EKG:  The EKG was personally reviewed and demonstrates: Atrial fibrillation at 185 bpm with ST-T wave changes Telemetry:  Telemetry was personally reviewed and demonstrates: Atrial fibrillation at 60 bpm  Relevant CV Studies:  ECHO 08/12/2010 Left ventricle: The cavity size was normal. Wall thickness was increased in a pattern of mild to moderate LVH. Systolic function was normal. The estimated ejection fraction was in the range of 50% to 55%. Wall motion was normal; there were no regional wall motion abnormalities  Laboratory Data:  Chemistry Recent Labs  Lab 01/21/19 2250 01/22/19 0447  NA 141 142  K 3.0* 4.3  CL 107 113*  CO2 22 22  GLUCOSE 166* 110*  BUN 7 7  CREATININE 0.70 0.59*  CALCIUM 8.6* 8.2*  GFRNONAA >60 >60  GFRAA >60 >60  ANIONGAP 12 7    Recent Labs  Lab 01/21/19 2250 01/22/19 0447  PROT 7.0 5.7*  ALBUMIN 3.3* 2.7*  AST 52* 44*  ALT 25 21  ALKPHOS 131* 95  BILITOT 2.5* 2.1*  Hematology Recent Labs  Lab 01/21/19 2250 01/22/19 0447  WBC 1.9* 4.3  RBC 4.02* 3.65*  HGB 14.1 12.6*  HCT 42.1 38.9*  MCV 104.7* 106.6*  MCH 35.1* 34.5*  MCHC 33.5 32.4  RDW 14.2 14.1  PLT 48* 57*   Cardiac Enzymes Recent Labs  Lab 01/21/19 2250 01/22/19 0157 01/22/19 0748  TROPONINI 0.05* 0.36* 0.68*   No results for input(s): TROPIPOC in the last 168 hours.  BNPNo results for input(s): BNP, PROBNP in the last 168 hours.  DDimer No results for input(s): DDIMER in the last 168 hours.  Radiology/Studies:  Ct Head Wo Contrast  Result Date: 01/22/2019 CLINICAL DATA:  Altered mental status EXAM: CT HEAD WITHOUT CONTRAST TECHNIQUE: Contiguous axial images were obtained from the base of the skull through the vertex without intravenous contrast. COMPARISON:  Head CT 11/07/2018  FINDINGS: Brain: There is no mass, hemorrhage or extra-axial collection. The size and configuration of the ventricles and extra-axial CSF spaces are normal. The brain parenchyma is normal, without acute or chronic infarction. Vascular: No abnormal hyperdensity of the major intracranial arteries or dural venous sinuses. No intracranial atherosclerosis. Skull: The visualized skull base, calvarium and extracranial soft tissues are normal. Sinuses/Orbits: No fluid levels or advanced mucosal thickening of the visualized paranasal sinuses. No mastoid or middle ear effusion. Right phthisis bulbi. IMPRESSION: Normal aging brain. Electronically Signed   By: Ulyses Jarred M.D.   On: 01/22/2019 01:30   Dg Chest Port 1 View  Result Date: 01/21/2019 CLINICAL DATA:  Seizure, shortness of breath EXAM: PORTABLE CHEST 1 VIEW COMPARISON:  04/24/2018 FINDINGS: Cardiomegaly with vascular congestion. Suspect mild interstitial edema. No effusions. No acute bony abnormality. IMPRESSION: Cardiomegaly, mild interstitial edema. Electronically Signed   By: Rolm Baptise M.D.   On: 01/21/2019 23:12    Assessment and Plan:   1. Atrial fibrillation with RVR new onset although patient says he has had for a long time, on metoprolol at home.  Chadsvasc score equals 3 but thrombocytopenia with platelet count of 48,000 so will not anticoagulate at this time.  Potassium 3.0 on admission has been replaced now 4.3.  Rate now controlled in the 60s.  Will wean IV diltiazem and increase metoprolol to 100 mg BID. 2. History of chest pain and use of his wife's nitroglycerin elevated troponin 0.05, 0.36 and 0.68.  Will repeat EKG, obtain 2D echo.  We will try to treat medically at this time.  With low platelets higher risk for DAPT.  Currently pain-free.  Can risk stratify as an outpatient.  We will try to  add Imdur if BP allows although currently too low.. 3. Question of syncope versus seizure with history of seizure disorder-on seizure  precautions and CT negative 4. Thrombocytopenia 5. Essential hypertension 6. Diabetes mellitus 7. Hyperlipidemia 8. Strong family history of CAD 9. CXR with interstitial edema but no CHF on exam. On lasix 20 mg daily.      For questions or updates, please contact [facility] Please consult [email or web address] for contact info under     Signed, Ermalinda Barrios, PA-C  01/22/2019 9:50 AM   The patient was seen and examined, and I agree with the history, physical exam, assessment and plan as documented above, with modifications as noted below. I have also personally reviewed all relevant documentation, old records, labs, and both radiographic and cardiovascular studies. I have also independently interpreted old and new ECG's.  Briefly, this is a 58 year old male who appears older than stated age with a history  of hypertension, diabetes mellitus, hyperlipidemia, hepatitis, cirrhosis, CVA, and right eye blindness.  He is somewhat of a poor historian so some the history is obtained from the EMR.  He was brought to the ED for a possible seizure.  He had been coming out of the bathroom and fell to the floor and had altered mental status.  Upon presentation the ED he was found to be in rapid atrial fibrillation, 185 bpm.  He does take metoprolol at home.  Troponins were found to be elevated at 0.05, 0.36, and 0.68.  Ammonia level was 165, now down to 131.  Platelets are low at 48,000.  He was started on IV diltiazem drip with heart rates currently in the 70 bpm range.  He has been having episodic chest pain for the past several years.  He occasionally takes his wife's nitroglycerin.  He experiences palpitations at home and says this is when his chest pain is made worse.  I reviewed an echocardiogram report in Care Everywhere performed in March 2020 which demonstrated normal left ventricular systolic function, LVEF 60 to 65%, degenerative mitral valve disease, and aortic valve sclerosis.  He  currently denies chest pain and palpitations.  Unclear if he had a fall versus seizure versus frank syncope.  He was in rapid atrial fibrillation with a heart rate of 185 bpm upon arrival which could have certainly led to a fall.  Head CT was without acute abnormalities.  Given his multiple cardiovascular risk factors, he most certainly has some degree of coronary artery disease.  Troponin elevation may be due to demand ischemia in the context of rapid atrial fibrillation.   However given his thrombocytopenia, he is a poor candidate for dual antiplatelet therapy if he were to need percutaneous coronary intervention.  For the time being I would recommend managing medically.    I will increase Lopressor to 100 mg twice daily both for rate control for atrial fibrillation as well as for antianginal benefit. Given his soft BP, I am unable to add long-acting nitrates at this time.  I would consider an outpatient nuclear stress test for re-stratification purposes.  With respect to atrial fibrillation, he is not a good candidate for systemic anticoagulation given thrombocytopenia and history of seizure with falls.  As stated above, I will increase Lopressor to 100 mg twice daily for rate control and discontinue IV diltiazem.  An echocardiogram has been performed which I will review.   Kate Sable, MD, [facility]  01/22/2019 11:02 AM

## 2019-01-22 NOTE — Progress Notes (Signed)
CRITICAL VALUE ALERT  Critical Value:  Troponin 0.68  Date & Time Notied:  01/22/19 @ 0825  Provider Notified: Dr. Roxan Hockey  Orders Received/Actions taken: Awaiting orders

## 2019-01-22 NOTE — Progress Notes (Signed)
*  PRELIMINARY RESULTS* Echocardiogram 2D Echocardiogram has been performed.  [name] [name] 01/22/2019, 9:49 AM

## 2019-01-22 NOTE — Progress Notes (Signed)
EEG Complete  Results Pending 

## 2019-01-22 NOTE — ED Notes (Signed)
This RN spoke with patients wife giving her an update, she states "He is a DNR, I didn't send the form due to not getting it back last time.". Notified Lama MD.

## 2019-01-23 DIAGNOSIS — R7989 Other specified abnormal findings of blood chemistry: Secondary | ICD-10-CM

## 2019-01-23 DIAGNOSIS — I4891 Unspecified atrial fibrillation: Secondary | ICD-10-CM | POA: Diagnosis present

## 2019-01-23 LAB — CBC
HCT: 38.4 % — ABNORMAL LOW (ref 39.0–52.0)
Hemoglobin: 12.4 g/dL — ABNORMAL LOW (ref 13.0–17.0)
MCH: 34.6 pg — ABNORMAL HIGH (ref 26.0–34.0)
MCHC: 32.3 g/dL (ref 30.0–36.0)
MCV: 107.3 fL — ABNORMAL HIGH (ref 80.0–100.0)
Platelets: 59 10*3/uL — ABNORMAL LOW (ref 150–400)
RBC: 3.58 MIL/uL — ABNORMAL LOW (ref 4.22–5.81)
RDW: 14 % (ref 11.5–15.5)
WBC: 3.7 10*3/uL — ABNORMAL LOW (ref 4.0–10.5)
nRBC: 0 % (ref 0.0–0.2)

## 2019-01-23 MED ORDER — RIFAXIMIN 550 MG PO TABS: 550.0000 mg | ORAL_TABLET | Freq: Two times a day (BID) | ORAL | 3 refills | Status: DC

## 2019-01-23 MED ORDER — ISOSORBIDE DINITRATE 10 MG PO TABS: 10.0000 mg | ORAL_TABLET | Freq: Two times a day (BID) | ORAL | 3 refills | Status: DC

## 2019-01-23 MED ORDER — POTASSIUM CHLORIDE CRYS ER 20 MEQ PO TBCR: 20.0000 meq | EXTENDED_RELEASE_TABLET | Freq: Every day | ORAL | 1 refills | Status: DC

## 2019-01-23 MED ORDER — POTASSIUM CHLORIDE CRYS ER 20 MEQ PO TBCR: 20.0000 meq | EXTENDED_RELEASE_TABLET | Freq: Two times a day (BID) | ORAL | 1 refills | Status: DC

## 2019-01-23 MED ORDER — LACTULOSE 10 GM/15ML PO SOLN: 40.0000 g | Freq: Three times a day (TID) | ORAL | 2 refills | Status: DC

## 2019-01-23 MED ORDER — METOPROLOL TARTRATE 100 MG PO TABS: 100.0000 mg | ORAL_TABLET | Freq: Two times a day (BID) | ORAL | 3 refills | Status: DC

## 2019-01-23 MED ORDER — ONDANSETRON HCL 4 MG PO TABS: 4.0000 mg | ORAL_TABLET | Freq: Four times a day (QID) | ORAL | 0 refills | Status: DC | PRN

## 2019-01-23 NOTE — Care Management Obs Status (Signed)
[place] NOTIFICATION   Patient Details  Name: [name] [name] MRN: [id number] Date of Birth: 11/03/1960   Medicare Observation Status Notification Given:  Yes    Shade Flood, LCSW 01/23/2019, 4:51 PM

## 2019-01-23 NOTE — Care Management CC44 (Signed)
Condition Code 44 Documentation Completed  Patient Details  Name: [name] [name] MRN: [id number] Date of Birth: 01-09-1961   Condition Code 44 given:  Yes Patient signature on Condition Code 44 notice:  Yes Documentation of 2 MD's agreement:  Yes Code 44 added to claim:  Yes    Shade Flood, LCSW 01/23/2019, 4:52 PM

## 2019-01-23 NOTE — TOC Transition Note (Signed)
Transition of Care [facility]) - CM/SW Discharge Note   Patient Details  Name: [name] [name] MRN: [id number] Date of Birth: 1961-08-06  Transition of Care [facility] CM/SW Contact:  Shade Flood, LCSW Phone Number: 01/23/2019, 3:48 PM   Clinical Narrative:     Per MD, he is anticipating pt will be discharged home today. Referral sent to [facility] with [facility] at pt/wife request. Follow up appointment scheduled. There are no other TOC needs for dc.  Final next level of care: Home w Home Health Services Barriers to Discharge: Continued Medical Work up   Patient Goals and CMS Choice   CMS Medicare.gov Compare Post Acute Care list provided to:: Patient Choice offered to / list presented to : Patient  Discharge Placement                       Discharge Plan and Services                          HH Arranged: RN   Date [facility] Agency Contacted: 01/23/19 Time [place]: 1400 Representative spoke with at [place]: Leda Quail  Social Determinants of Health ([place]) Interventions     Readmission Risk Interventions Readmission Risk Prevention Plan 01/23/2019 01/22/2019  Transportation Screening - Complete  PCP or Specialist Appt within 3-5 Days Complete -  HRI or Home Care Consult Complete -  Social Work Consult for [place] Planning/Counseling Complete -  Palliative Care Screening - Not Applicable  Medication Review Press photographer) Complete -  Some recent data might be hidden

## 2019-01-23 NOTE — Progress Notes (Signed)
Progress Note  Patient Name: [name] [name] Date of Encounter: 01/23/2019  Primary Cardiologist: Kate Sable, MD   Subjective   Resting comfortably.  Inpatient Medications    Scheduled Meds: . Chlorhexidine Gluconate Cloth  6 each Topical Q0600  . ezetimibe  10 mg Oral Daily  . lactulose  30 g Oral TID  . metoprolol tartrate  100 mg Oral BID  . pantoprazole (PROTONIX) IV  40 mg Intravenous Q24H  . pneumococcal 23 valent vaccine  0.5 mL Intramuscular Tomorrow-1000  . rifaximin  550 mg Oral BID  . sodium chloride flush  3 mL Intravenous Q12H  . umeclidinium-vilanterol  1 puff Inhalation Daily   Continuous Infusions: . sodium chloride 10 mL/hr at 01/22/19 1353  . sodium chloride     PRN Meds: sodium chloride, acetaminophen, alum & mag hydroxide-simeth, LORazepam, ondansetron **OR** ondansetron (ZOFRAN) IV, oxyCODONE, sodium chloride flush   Vital Signs    Vitals:   01/23/19 0400 01/23/19 0500 01/23/19 0600 01/23/19 0727  BP: 132/63 140/73 131/83   Pulse: 60 66 70 63  Resp: 10 12 11 13   Temp: 98.3 F (36.8 C)   98.5 F (36.9 C)  TempSrc: Oral   Oral  SpO2: 95% 94% 95% 95%  Weight:  100.8 kg    Height:        Intake/Output Summary (Last 24 hours) at 01/23/2019 0753 Last data filed at 01/23/2019 0447 Gross per 24 hour  Intake 569.58 ml  Output 550 ml  Net 19.58 ml   Last 3 Weights 01/23/2019 01/22/2019 01/22/2019  Weight (lbs) 222 lb 3.6 oz 221 lb 5.5 oz 221 lb 5.5 oz  Weight (kg) 100.8 kg 100.4 kg 100.4 kg      Telemetry    Sinus rhythm with PVCs- Personally Reviewed   Physical Exam   GEN: No acute distress.  Appears older than stated age Neck: No JVD Cardiac: RRR, no murmurs, rubs, or gallops.  Respiratory: Clear to auscultation bilaterally. GI: Soft, nontender, non-distended  MS: No edema; No deformity. Neuro:  Nonfocal  Psych: Normal affect   Labs    Chemistry Recent Labs  Lab 01/21/19 2250 01/22/19 0447  NA 141 142  K 3.0* 4.3  CL 107  113*  CO2 22 22  GLUCOSE 166* 110*  BUN 7 7  CREATININE 0.70 0.59*  CALCIUM 8.6* 8.2*  PROT 7.0 5.7*  ALBUMIN 3.3* 2.7*  AST 52* 44*  ALT 25 21  ALKPHOS 131* 95  BILITOT 2.5* 2.1*  GFRNONAA >60 >60  GFRAA >60 >60  ANIONGAP 12 7     Hematology Recent Labs  Lab 01/21/19 2250 01/22/19 0447 01/23/19 0428  WBC 1.9* 4.3 3.7*  RBC 4.02* 3.65* 3.58*  HGB 14.1 12.6* 12.4*  HCT 42.1 38.9* 38.4*  MCV 104.7* 106.6* 107.3*  MCH 35.1* 34.5* 34.6*  MCHC 33.5 32.4 32.3  RDW 14.2 14.1 14.0  PLT 48* 57* 59*    Cardiac Enzymes Recent Labs  Lab 01/21/19 2250 01/22/19 0157 01/22/19 0748 01/22/19 1345  TROPONINI 0.05* 0.36* 0.68* 0.59*   No results for input(s): TROPIPOC in the last 168 hours.   BNPNo results for input(s): BNP, PROBNP in the last 168 hours.   DDimer No results for input(s): DDIMER in the last 168 hours.   Radiology    Ct Head Wo Contrast  Result Date: 01/22/2019 CLINICAL DATA:  Altered mental status EXAM: CT HEAD WITHOUT CONTRAST TECHNIQUE: Contiguous axial images were obtained from the base of the skull through the  vertex without intravenous contrast. COMPARISON:  Head CT 11/07/2018 FINDINGS: Brain: There is no mass, hemorrhage or extra-axial collection. The size and configuration of the ventricles and extra-axial CSF spaces are normal. The brain parenchyma is normal, without acute or chronic infarction. Vascular: No abnormal hyperdensity of the major intracranial arteries or dural venous sinuses. No intracranial atherosclerosis. Skull: The visualized skull base, calvarium and extracranial soft tissues are normal. Sinuses/Orbits: No fluid levels or advanced mucosal thickening of the visualized paranasal sinuses. No mastoid or middle ear effusion. Right phthisis bulbi. IMPRESSION: Normal aging brain. Electronically Signed   By: Ulyses Jarred M.D.   On: 01/22/2019 01:30   Dg Chest Port 1 View  Result Date: 01/21/2019 CLINICAL DATA:  Seizure, shortness of breath EXAM:  PORTABLE CHEST 1 VIEW COMPARISON:  04/24/2018 FINDINGS: Cardiomegaly with vascular congestion. Suspect mild interstitial edema. No effusions. No acute bony abnormality. IMPRESSION: Cardiomegaly, mild interstitial edema. Electronically Signed   By: Rolm Baptise M.D.   On: 01/21/2019 23:12    Cardiac Studies   01/22/19 ECHO  1. The left ventricle has normal systolic function with an ejection fraction of 60-65%. The cavity size was normal. Severe basal septal hypertrophy. Otherwise, moderate hypertrophy of remaining myocardium. Left ventricular diastolic function could not  be evaluated secondary to atrial fibrillation. No evidence of left ventricular regional wall motion abnormalities.  2. The right ventricle has normal systolic function. The cavity was normal. There is no increase in right ventricular wall thickness.  3. Left atrial size was mildly dilated.  4. Right atrial size was mildly dilated.  5. The mitral valve is grossly normal. Mild thickening of the mitral valve leaflet.  6. The tricuspid valve is grossly normal.  7. The aortic valve is tricuspid. Mild thickening of the aortic valve.  8. The aortic root is normal in size and structure.  9. Pulmonary hypertension is mild. 10. The inferior vena cava was dilated in size with <50% respiratory variability.  FINDINGS  Left Ventricle: The left ventricle has normal systolic function, with an ejection fraction of 60-65%. The cavity size was normal. Severe basal septal hypertrophy. Otherwise, moderate hypertrophy of remaining myocardium. Left ventricular diastolic  function could not be evaluated secondary to atrial fibrillation. No evidence of left ventricular regional wall motion abnormalities..  Right Ventricle: The right ventricle has normal systolic function. The cavity was normal. There is no increase in right ventricular wall thickness.  Left Atrium: Left atrial size was mildly dilated.  Right Atrium: Right atrial size was mildly  dilated. Right atrial pressure is estimated at 15 mmHg.  Interatrial Septum: No atrial level shunt detected by color flow Doppler.  Pericardium: There is no evidence of pericardial effusion.  Mitral Valve: The mitral valve is grossly normal. Mild thickening of the mitral valve leaflet. Mitral valve regurgitation is trivial by color flow Doppler.  Tricuspid Valve: The tricuspid valve is grossly normal. Tricuspid valve regurgitation is mild by color flow Doppler.  Aortic Valve: The aortic valve is tricuspid Mild thickening of the aortic valve. Aortic valve regurgitation was not visualized by color flow Doppler. There is no evidence of aortic valve stenosis.  Pulmonic Valve: The pulmonic valve was grossly normal. Pulmonic valve regurgitation is trivial by color flow Doppler.  Aorta: The aortic root is normal in size and structure.  Pulmonary Artery: Pulmonary hypertension is mild.  Venous: The inferior vena cava is dilated in size with less than 50% respiratory variability.   Patient Profile     58 y.o. male  with a hx of hypertension, DM, HLD, family history of CAD now admitted for possible seizure and found to be in A fib RVR.    Assessment & Plan    1.  Atrial fibrillation with rapid ventricular response: He has converted to sinus rhythm.  Given thrombocytopenia with platelet count of 59,000 today and history of seizures with falls, he is not an optimal candidate for systemic anticoagulation.  Continue metoprolol 100 mg twice daily.  2.  Elevated troponin   pk of 0.68 EKG back in [place] no acute ST changes, similar to EKG in 9/19  Echo with EF 60-65% severe basal septal hypertrophy and otherwise moderate hypertrophy, no evidence of WMA. Mild pulmonary hypertension.    Given his multiple cardiovascular risk factors, he most certainly has some degree of coronary artery disease.  Troponin elevation may be due to demand ischemia in the context of rapid atrial fibrillation.   However  given his thrombocytopenia, he is a poor candidate for dual antiplatelet therapy if he were to need percutaneous coronary intervention.  For the time being I would recommend managing medically.   As blood pressure is normal, I will add isosorbide dinitrate 10 mg twice daily.  Continue Lopressor 100 mg twice daily. I would consider an outpatient nuclear stress test for risk stratification purposes.  3.  Elevated ammonia per PCP  4.  Hypertension: Blood pressure most recently 131/83.  I will add isosorbide dinitrate 10 mg twice daily so this will need continued monitoring.  5.  Hyperlipidemia: On Zetia .   [facility] will sign off.   Medication Recommendations:  As above  Other recommendations (labs, testing, etc): Consider outpatient nuclear stress test Follow up as an outpatient: We will arrange follow-up  For questions or updates, please contact [facility] Please consult [email or web address] for contact info under        Signed, Cecilie Kicks, NP  01/23/2019, 7:53 AM    The patient was seen and examined, and I agree with the history, physical exam, assessment and plan as documented above. I have made modifications to the physical exam and assessment and plan above to reflect my thoughts.   Kate Sable, MD, [facility]  01/23/2019 8:37 AM

## 2019-01-23 NOTE — Progress Notes (Signed)
Patient received discharge orders. Patient verbalized understanding. 2 peripheral IVs removed from patient. Patient to be discharged home with home health. Patient taken down via wheelchair to be discharged to home with wife.

## 2019-01-23 NOTE — Discharge Summary (Addendum)
[name] [name], is a 58 y.o. male  DOB 05/21/1961  MRN [id number].  Admission date:  01/21/2019  Admitting Physician  Oswald Hillock, MD  Discharge Date:  01/23/2019   Primary MD  Hermine Messick, MD  Recommendations for primary care physician for things to follow:   1)Very low-salt diet advised 2)Weigh yourself daily, call if you gain more than 3 pounds in 1 day or more than 5 pounds in 1 week as your diuretic medications may need to be adjusted 3)Limit your Fluid  intake to no more than 60 ounces (1.8 Liters) per day 4) take lactulose as prescribed--- the goal is for you to have 3-4 bowel movements every day..... Constipation or infrequent bowel movement will lead to encephalopathy and confusion 5)Avoid ibuprofen/Advil/Aleve/Motrin/Goody Powders/Naproxen/BC powders/Meloxicam/Diclofenac/Indomethacin and other Nonsteroidal anti-inflammatory medications as these will make you more likely to bleed and can cause stomach ulcers, can also cause Kidney problems.  6) please note changes to your medications 7) follow-up with your regular physician in 1 week for reevaluation and for repeat CBC and BMP blood test  Admission Diagnosis  Atrial fibrillation with RVR ([place]) [I48.91]   Discharge Diagnosis  Atrial fibrillation with RVR ([place]) [I48.91]    Principal Problem:   Acute hepatic encephalopathy ([place]) Active Problems:   Atrial fibrillation with RVR (HCC)   HTN (hypertension)   Thrombocytopenia (H[facility])   Hyperammonemia (H[facility])      Past Medical History:  Diagnosis Date  . Anemia   . Ankle deformity, right    chronic  . Anxiety   . Arthritis   . Bipolar 1 disorder ([place])   . Blind right eye   . Blood clots in brain    per patient  . Cardiomegaly   . Cataract    Right eye  . Cerebrovascular disease    Right vertebral artery  . Cholelithiasis    Asymptomatic  . Chronic back pain   . Chronic pain   .  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 Cirrhosis ([place])    diagnosed 07/2010 when presented with first episode of hepatic encephalopathy, afp on 416/13=4.8,  U/S on 12/14/11  liver stable, pt has received 2 hep A/B vaccines  . COPD (chronic obstructive pulmonary disease) ([place])   . Dementia ([place])   . Depression   . Diabetes mellitus   . Diverticulosis   . Dyspnea   . GERD (gastroesophageal reflux disease)   . Hard of hearing   . Hepatic encephalopathy ([place])   . Hepatitis   . Hyperlipidemia   . Hypertension   . Lymphedema    R>L  . Mental retardation   . Noncompliance   . Seizures ([place])   . Thrombocytopenia ([place])     Past Surgical History:  Procedure Laterality Date  . CAST APPLICATION  0/0/4599   Procedure: MINOR CAST APPLICATION;  Surgeon: [name], MD;  Location: [facility];  Service: Orthopedics;  Laterality: Right;  no anesthesia , procedure room do not need or room !  . COLONOSCOPY  12/15/11   colonic divericulosis;poor prep, ACBE  recommended but has not been done  . COLONOSCOPY WITH PROPOFOL N/A 11/07/2017   Dr. Gala Romney: inadequate prep, internal hemorrhoids. Early interval due March 2020 due to poor prep  . ear operations    . ESOPHAGOGASTRODUODENOSCOPY  02/02/2011   Rourk-Normal esophagus.  No varices/Question mild portal gastropathy, extrinsic compression on the antrum, lesser curvature of uncertain significance, some minimally  nodular mucosa status post biopsy, patent pylorus, normal duodenum 1 and duodenum 2.  . ESOPHAGOGASTRODUODENOSCOPY  12/15/11   Rourk-->mild changes of portal gastropathy, gastric/duodenal erosions s/p bx  (reactive changes with mild chronic inflammation. No H.pylori  . ESOPHAGOGASTRODUODENOSCOPY (EGD) WITH PROPOFOL N/A 11/07/2017   Dr. Gala Romney: normal esophagus s/p dilation, portal hypertensive gastropathy, erosive gastropathy, normal duodenum  . FOOT SURGERY    . MALONEY DILATION N/A 11/07/2017   Procedure: Venia Minks DILATION;  Surgeon: Daneil Dolin, MD;  Location: [facility] SUITE;  Service: Endoscopy;  Laterality: N/A;  . ORIF ANKLE FRACTURE  08/27/2011   Procedure: OPEN REDUCTION INTERNAL FIXATION (ORIF) ANKLE FRACTURE;  Surgeon: [name], MD;  Location: [facility];  Service: Orthopedics;  Laterality: Right;  . TONSILLECTOMY         HPI  from the history and physical done on the day of admission:    [name] [name]  is a 58 y.o. male, with history of liver cirrhosis, seizure disorder, thrombocytopenia, history of hepatitis, hepatic encephalopathy, type 2 diabetes mellitus, COPD, dementia, CVA, chronic systolic heart failure, hypertension was brought to the ED by EMS for questionable seizure.  Patient is pleasantly confused unable to provide significant history.  I called patient's wife and as per patient wife patient went to bathroom and as he came out of the bathroom he fell on the floor and was acting very weird.  He was not responding though he was alert.  Unclear details.  There was no shaking of limbs.  This episode lasted for about 10 minutes as per patient's wife. At this time in the ED patient is alert, communicating.  He knows he is in the hospital and knows that he was brought to hospital for possible seizure.  In the ED, patient was found to be in A. fib with RVR.  Started on IV Cardizem drip. He has no history of A. fib in the past. Also found to have mild elevation of troponin 0.05, potassium 3.0. Ammonia level 165 SARS-CoV-2 is negative   Hospital Course:     1)Hepatic encephalopathy/Liver Cirrhosis--- clinically resolved after bowel movements with lactulose,, did need to be compliant with lactulose emphasized patient will get home health RN to improve medication compliance post discharge, -- EEG without epileptiform findings, continue rifaximin, on admission ammonia was 165  2)A. fib with RVR----converted back to sinus rhythm, off IV Cardizem drip at this time cardiology consult appreciated titrating metoprolol up to  100 mg twice daily, no anticoagulation given persistent thrombocytopenia and concerns about possible esophageal and gastric varices.   3)HTN--stable, metoprolol increased to 100 twice daily continue other medications  Discharge Condition: stable  Follow UP  Follow-up Information    Erma Heritage, PA-C Follow up.   Specialties:  Physician Assistant, Cardiology Why:  the office will call   Contact information: [place] [address] [phone]        Hermine Messick, MD. Go on 01/30/2019.   Specialty:  Family Medicine Why:  Please arrive at 11:00 and call the office number when you arrive. They  will come out to the car to check you in. [phone] Contact information: [address] [place] [address] [phone]           Consults obtained - cardiology  Diet and Activity recommendation:  As advised  Discharge Instructions    Discharge Instructions    Call MD for:  difficulty breathing, headache or visual disturbances   Complete by:  As directed    Call MD for:  persistant dizziness or light-headedness   Complete by:  As directed    Call MD for:  persistant nausea and vomiting   Complete by:  As directed    Call MD for:  temperature >100.4   Complete by:  As directed    Diet - low sodium heart healthy   Complete by:  As directed    Discharge instructions   Complete by:  As directed    1)Very low-salt diet advised 2)Weigh yourself daily, call if you gain more than 3 pounds in 1 day or more than 5 pounds in 1 week as your diuretic medications may need to be adjusted 3)Limit your Fluid  intake to no more than 60 ounces (1.8 Liters) per day 4) take lactulose as prescribed--- the goal is for you to have 3-4 bowel movements every day..... Constipation or infrequent bowel movement will lead to encephalopathy and confusion 5)Avoid ibuprofen/Advil/Aleve/Motrin/Goody Powders/Naproxen/BC  powders/Meloxicam/Diclofenac/Indomethacin and other Nonsteroidal anti-inflammatory medications as these will make you more likely to bleed and can cause stomach ulcers, can also cause Kidney problems.  6) please note changes to your medications 7) follow-up with your regular physician in 1 week for reevaluation and for repeat CBC and BMP blood test   Increase activity slowly   Complete by:  As directed         Discharge Medications     Allergies as of 01/23/2019      Reactions   Penicillins Rash   Has patient had a PCN reaction causing immediate rash, facial/tongue/throat swelling, SOB or lightheadedness with hypotension: Yes Has patient had a PCN reaction causing severe rash involving mucus membranes or skin necrosis: No Has patient had a PCN reaction that required hospitalization No Has patient had a PCN reaction occurring within the last 10 years: No If all of the above answers are "NO", then may proceed with Cephalosporin use.      Medication List    TAKE these medications   albuterol 108 (90 Base) MCG/ACT inhaler Commonly known as:  VENTOLIN HFA Inhale 2 puffs into the lungs every 4 (four) hours as needed for wheezing or shortness of breath.   ALPRAZolam 0.5 MG tablet Commonly known as:  XANAX Take 1 tablet by mouth 3 (three) times daily as needed.   ammonium lactate 12 % cream Commonly known as:  AMLACTIN Apply 1 application topically daily as needed.   ezetimibe 10 MG tablet Commonly known as:  ZETIA Take 10 mg by mouth daily.   furosemide 20 MG tablet Commonly known as:  LASIX Take 20 mg by mouth daily.   isosorbide dinitrate 10 MG tablet Commonly known as:  ISORDIL Take 1 tablet (10 mg total) by mouth 2 (two) times daily.   lactulose 10 GM/15ML solution Commonly known as:  CHRONULAC Take 60 mLs (40 g total) by mouth 3 (three) times daily. Titrate for 3-4 bowel motions daily. What changed:  how much to take   metoprolol tartrate 100 MG tablet Commonly  known as:  LOPRESSOR Take 1 tablet (100 mg  total) by mouth 2 (two) times daily. What changed:    medication strength  how much to take  when to take this   ondansetron 4 MG tablet Commonly known as:  ZOFRAN Take 1 tablet (4 mg total) by mouth every 6 (six) hours as needed for nausea.   oxyCODONE 5 MG immediate release tablet Commonly known as:  Oxy IR/ROXICODONE Take 1 tablet by mouth 3 (three) times daily.   pantoprazole 40 MG tablet Commonly known as:  PROTONIX Take 40 mg by mouth daily.   potassium chloride SA 20 MEQ tablet Commonly known as:  K-DUR Take 1 tablet (20 mEq total) by mouth daily.   QUEtiapine 25 MG tablet Commonly known as:  SEROQUEL Take 1 tablet (25 mg total) by mouth at bedtime. What changed:  how much to take   rifaximin 550 MG Tabs tablet Commonly known as:  XIFAXAN Take 1 tablet (550 mg total) by mouth 2 (two) times daily.   saccharomyces boulardii 250 MG capsule Commonly known as:  Florastor Take 1 capsule (250 mg total) by mouth 2 (two) times daily.   silver sulfADIAZINE 1 % cream Commonly known as:  SILVADENE Apply 1 application topically daily. As directed   traZODone 50 MG tablet Commonly known as:  DESYREL Take 0.5 tablets by mouth at bedtime as needed.   umeclidinium-vilanterol 62.5-25 MCG/INH Aepb Commonly known as:  ANORO ELLIPTA Inhale 1 puff into the lungs daily.       Major procedures and Radiology Reports - PLEASE review detailed and final reports for all details, in brief -    Ct Head Wo Contrast  Result Date: 01/22/2019 CLINICAL DATA:  Altered mental status EXAM: CT HEAD WITHOUT CONTRAST TECHNIQUE: Contiguous axial images were obtained from the base of the skull through the vertex without intravenous contrast. COMPARISON:  Head CT 11/07/2018 FINDINGS: Brain: There is no mass, hemorrhage or extra-axial collection. The size and configuration of the ventricles and extra-axial CSF spaces are normal. The brain parenchyma is  normal, without acute or chronic infarction. Vascular: No abnormal hyperdensity of the major intracranial arteries or dural venous sinuses. No intracranial atherosclerosis. Skull: The visualized skull base, calvarium and extracranial soft tissues are normal. Sinuses/Orbits: No fluid levels or advanced mucosal thickening of the visualized paranasal sinuses. No mastoid or middle ear effusion. Right phthisis bulbi. IMPRESSION: Normal aging brain. Electronically Signed   By: Ulyses Jarred M.D.   On: 01/22/2019 01:30   Dg Chest Port 1 View  Result Date: 01/21/2019 CLINICAL DATA:  Seizure, shortness of breath EXAM: PORTABLE CHEST 1 VIEW COMPARISON:  04/24/2018 FINDINGS: Cardiomegaly with vascular congestion. Suspect mild interstitial edema. No effusions. No acute bony abnormality. IMPRESSION: Cardiomegaly, mild interstitial edema. Electronically Signed   By: Rolm Baptise M.D.   On: 01/21/2019 23:12    Micro Results    Recent Results (from the past 240 hour(s))  SARS Coronavirus 2 (CEPHEID - Performed in [facility] hospital lab), Hosp Order     Status: None   Collection Time: 01/21/19 11:21 PM  Result Value Ref Range Status   SARS Coronavirus 2 NEGATIVE NEGATIVE Final    Comment: (NOTE) If result is NEGATIVE SARS-CoV-2 target nucleic acids are NOT DETECTED. The SARS-CoV-2 RNA is generally detectable in upper and lower  respiratory specimens during the acute phase of infection. The lowest  concentration of SARS-CoV-2 viral copies this assay can detect is 250  copies / mL. A negative result does not preclude SARS-CoV-2 infection  and should not be  used as the sole basis for treatment or other  patient management decisions.  A negative result may occur with  improper specimen collection / handling, submission of specimen other  than nasopharyngeal swab, presence of viral mutation(s) within the  areas targeted by this assay, and inadequate number of viral copies  (<250 copies / mL). A negative  result must be combined with clinical  observations, patient history, and epidemiological information. If result is POSITIVE SARS-CoV-2 target nucleic acids are DETECTED. The SARS-CoV-2 RNA is generally detectable in upper and lower  respiratory specimens dur ing the acute phase of infection.  Positive  results are indicative of active infection with SARS-CoV-2.  Clinical  correlation with patient history and other diagnostic information is  necessary to determine patient infection status.  Positive results do  not rule out bacterial infection or co-infection with other viruses. If result is PRESUMPTIVE POSTIVE SARS-CoV-2 nucleic acids MAY BE PRESENT.   A presumptive positive result was obtained on the submitted specimen  and confirmed on repeat testing.  While 2019 novel coronavirus  (SARS-CoV-2) nucleic acids may be present in the submitted sample  additional confirmatory testing may be necessary for epidemiological  and / or clinical management purposes  to differentiate between  SARS-CoV-2 and other Sarbecovirus currently known to infect humans.  If clinically indicated additional testing with an alternate test  methodology [phone]) is advised. The SARS-CoV-2 RNA is generally  detectable in upper and lower respiratory sp ecimens during the acute  phase of infection. The expected result is Negative. Fact Sheet for Patients:  StrictlyIdeas.no Fact Sheet for Healthcare Providers: BankingDealers.co.za This test is not yet approved or cleared by the [place] FDA and has been authorized for detection and/or diagnosis of SARS-CoV-2 by FDA under an Emergency Use Authorization (EUA).  This EUA will remain in effect (meaning this test can be used) for the duration of the COVID-19 declaration under Section 564(b)(1) of the Act, 21 U.S.C. section 360bbb-3(b)(1), unless the authorization is terminated or revoked sooner. Performed at [facility], [address] [place], [place] [address]   MRSA PCR Screening     Status: None   Collection Time: 01/22/19  1:19 AM  Result Value Ref Range Status   MRSA by PCR NEGATIVE NEGATIVE Final    Comment:        The GeneXpert MRSA Assay (FDA approved for NASAL specimens only), is one component of a comprehensive MRSA colonization surveillance program. It is not intended to diagnose MRSA infection nor to guide or monitor treatment for MRSA infections. Performed at [facility], [address], [place], [place] [address]        Today   Subjective   [name] [name] today has no new concerns... Coherent and talkative,  Mentation has improved significantly after multiple bowel movements Ambulating in the room without dyspnea on exertion or chest pain          Patient has been seen and examined prior to discharge   Objective   Blood pressure 122/70, pulse 60, temperature 98.6 F (37 C), temperature source Oral, resp. rate 19, height 5' 7"  (1.702 m), weight 100.8 kg, SpO2 95 %.   Intake/Output Summary (Last 24 hours) at 01/23/2019 1627 Last data filed at 01/23/2019 0955 Gross per 24 hour  Intake 373.49 ml  Output 550 ml  Net -176.51 ml   Exam Gen:- Awake Alert, no acute distress  HEENT:- [place].AT, No sclera icterus Neck-Supple Neck,No JVD,.  Lungs-  CTAB , good air movement  bilaterally  CV- S1, S2 normal, regular (history of A. Fib) Abd-  +ve B.Sounds, Abd Soft, No tenderness,    Extremity/Skin:- No  edema,   good pulses Psych-affect is appropriate, oriented x3 Neuro-no new focal deficits, no tremors    Data Review   CBC w Diff:  Lab Results  Component Value Date   WBC 3.7 (L) 01/23/2019   HGB 12.4 (L) 01/23/2019   HGB 14.5 11/13/2008   HCT 38.4 (L) 01/23/2019   HCT 28 04/06/2012   PLT 59 (L) 01/23/2019   PLT 97 (L) 11/13/2008   LYMPHOPCT 33 04/25/2018   LYMPHOPCT 45.4 11/13/2008   MONOPCT 11 04/25/2018   MONOPCT 7.9 11/13/2008   EOSPCT 6 04/25/2018   EOSPCT  3.3 11/13/2008   BASOPCT 1 04/25/2018   BASOPCT 0.7 11/13/2008   CMP:  Lab Results  Component Value Date   NA 142 01/22/2019   NA 144 04/06/2012   K 4.3 01/22/2019   K 4.4 04/06/2012   CL 113 (H) 01/22/2019   CO2 22 01/22/2019   BUN 7 01/22/2019   CREATININE 0.59 (L) 01/22/2019   CREATININE 0.71 12/15/2017   PROT 5.7 (L) 01/22/2019   ALBUMIN 2.7 (L) 01/22/2019   ALBUMIN 2.7 04/06/2012   BILITOT 2.1 (H) 01/22/2019   BILITOT 0.9 04/06/2012   ALKPHOS 95 01/22/2019   ALKPHOS 112 04/06/2012   AST 44 (H) 01/22/2019   AST 36 04/06/2012   ALT 21 01/22/2019   Total Discharge time is about 33 minutes  Roxan Hockey M.D on 01/23/2019 at 4:27 PM  Go to [email or web address] -  for contact info  [facility] - Office  [phone]

## 2019-01-23 NOTE — Discharge Instructions (Signed)
1)Very low-salt diet advised 2)Weigh yourself daily, call if you gain more than 3 pounds in 1 day or more than 5 pounds in 1 week as your diuretic medications may need to be adjusted 3)Limit your Fluid  intake to no more than 60 ounces (1.8 Liters) per day 4) take lactulose as prescribed--- the goal is for you to have 3-4 bowel movements every day..... Constipation or infrequent bowel movement will lead to encephalopathy and confusion 5)Avoid ibuprofen/Advil/Aleve/Motrin/Goody Powders/Naproxen/BC powders/Meloxicam/Diclofenac/Indomethacin and other Nonsteroidal anti-inflammatory medications as these will make you more likely to bleed and can cause stomach ulcers, can also cause Kidney problems.  6) please note changes to your medications 7) follow-up with your regular physician in 1 week for reevaluation and for repeat CBC and BMP blood test

## 2019-02-22 ENCOUNTER — Encounter: Payer: Self-pay | Admitting: Student

## 2019-02-22 ENCOUNTER — Ambulatory Visit: Payer: Self-pay | Admitting: Student

## 2019-02-22 NOTE — Progress Notes (Deleted)
Cardiology Office Note    Date:  02/22/2019   ID:  [name], [name] 05-17-1961, MRN [id number]  PCP:  Hermine Messick, MD  Cardiologist: Kate Sable, MD    No chief complaint on file.   History of Present Illness:    [name] [name] is a 58 y.o. male with past medical history of HTN, HLD, Type 2 DM, family history of CAD, dementia, and prior CVA who presents to the office today for hospital follow-up.   He most recently presented to [facility] on 01/21/2019 for evaluation of a fall and concerns for a seizure. Upon arrival to the ED, he was found to be in atrial fibrillation with RVR and started in IV Cardizem for rate-control.  He had chronic thrombocytopenia and platelet count was 48 K on admission. Troponin values were elevated, peaking at 0.68 during admission.  He did convert to normal sinus rhythm during admission and was switched from IV Cardizem to PTA Lopressor 100mg  BID. Was not started on anticoagulation given his high fall risk and thrombocytopenia. Echo showed a preserved EF of 60-65% with basal septal hypertrophy but no regional WMA or significant valvular abnormalities. Medical management of his CAD was recommended and he was started on Isosorbide Dinitrate 10mg  BID with consideration of outpatient NST for risk stratification purposes.       Past Medical History:  Diagnosis Date  . Anemia   . Ankle deformity, right    chronic  . Anxiety   . Arthritis   . Bipolar 1 disorder ([place])   . Blind right eye   . Blood clots in brain    per patient  . Cardiomegaly   . Cataract    Right eye  . Cerebrovascular disease    Right vertebral artery  . Cholelithiasis    Asymptomatic  . Chronic back pain   . Chronic pain   . Cirrhosis ([place])    suspected NASH  . Cirrhosis ([place])    diagnosed 07/2010 when presented with first episode of hepatic encephalopathy, afp on 416/13=4.8,  U/S on 12/14/11  liver stable, pt has received 2 hep A/B vaccines  . COPD (chronic  obstructive pulmonary disease) ([employer])   . Dementia ([place])   . Depression   . Diabetes mellitus   . Diverticulosis   . Dyspnea   . GERD (gastroesophageal reflux disease)   . Hard of hearing   . Hepatic encephalopathy ([place])   . Hepatitis   . Hyperlipidemia   . Hypertension   . Lymphedema    R>L  . Mental retardation   . Noncompliance   . Seizures ([place])   . Thrombocytopenia ([place])     Past Surgical History:  Procedure Laterality Date  . CAST APPLICATION  4/0/8144   Procedure: MINOR CAST APPLICATION;  Surgeon: [name], MD;  Location: [facility] ORS;  Service: Orthopedics;  Laterality: Right;  no anesthesia , procedure room do not need or room !  . COLONOSCOPY  12/15/11   colonic divericulosis;poor prep, ACBE  recommended but has not been done  . COLONOSCOPY WITH PROPOFOL N/A 11/07/2017   Dr. Gala Romney: inadequate prep, internal hemorrhoids. Early interval due March 2020 due to poor prep  . ear operations    . ESOPHAGOGASTRODUODENOSCOPY  02/02/2011   Rourk-Normal esophagus.  No varices/Question mild portal gastropathy, extrinsic compression on the antrum, lesser curvature of uncertain significance, some minimally  nodular mucosa status post biopsy, patent pylorus, normal duodenum 1 and duodenum 2.  . ESOPHAGOGASTRODUODENOSCOPY  12/15/11  Rourk-->mild changes of portal gastropathy, gastric/duodenal erosions s/p bx  (reactive changes with mild chronic inflammation. No H.pylori  . ESOPHAGOGASTRODUODENOSCOPY (EGD) WITH PROPOFOL N/A 11/07/2017   Dr. Gala Romney: normal esophagus s/p dilation, portal hypertensive gastropathy, erosive gastropathy, normal duodenum  . FOOT SURGERY    . MALONEY DILATION N/A 11/07/2017   Procedure: Venia Minks DILATION;  Surgeon: Daneil Dolin, MD;  Location: [facility] ENDO SUITE;  Service: Endoscopy;  Laterality: N/A;  . ORIF ANKLE FRACTURE  08/27/2011   Procedure: OPEN REDUCTION INTERNAL FIXATION (ORIF) ANKLE FRACTURE;  Surgeon: [name], MD;  Location: [facility] ORS;  Service:  Orthopedics;  Laterality: Right;  . TONSILLECTOMY      Current Medications: Outpatient Medications Prior to Visit  Medication Sig Dispense Refill  . albuterol (PROVENTIL HFA;VENTOLIN HFA) 108 (90 Base) MCG/ACT inhaler Inhale 2 puffs into the lungs every 4 (four) hours as needed for wheezing or shortness of breath.    . ALPRAZolam (XANAX) 0.5 MG tablet Take 1 tablet by mouth 3 (three) times daily as needed.    Marland Kitchen ammonium lactate (AMLACTIN) 12 % cream Apply 1 application topically daily as needed.    . ezetimibe (ZETIA) 10 MG tablet Take 10 mg by mouth daily.    . furosemide (LASIX) 20 MG tablet Take 20 mg by mouth daily.    . isosorbide dinitrate (ISORDIL) 10 MG tablet Take 1 tablet (10 mg total) by mouth 2 (two) times daily. 60 tablet 3  . lactulose (CHRONULAC) 10 GM/15ML solution Take 60 mLs (40 g total) by mouth 3 (three) times daily. Titrate for 3-4 bowel motions daily. 473 mL 2  . metoprolol tartrate (LOPRESSOR) 100 MG tablet Take 1 tablet (100 mg total) by mouth 2 (two) times daily. 60 tablet 3  . ondansetron (ZOFRAN) 4 MG tablet Take 1 tablet (4 mg total) by mouth every 6 (six) hours as needed for nausea. 20 tablet 0  . oxyCODONE (OXY IR/ROXICODONE) 5 MG immediate release tablet Take 1 tablet by mouth 3 (three) times daily.     . pantoprazole (PROTONIX) 40 MG tablet Take 40 mg by mouth daily.    . potassium chloride SA (K-DUR) 20 MEQ tablet Take 1 tablet (20 mEq total) by mouth daily. 30 tablet 1  . QUEtiapine (SEROQUEL) 25 MG tablet Take 1 tablet (25 mg total) by mouth at bedtime. (Patient taking differently: Take 50 mg by mouth at bedtime. )    . rifaximin (XIFAXAN) 550 MG TABS tablet Take 1 tablet (550 mg total) by mouth 2 (two) times daily. 60 tablet 3  . saccharomyces boulardii (FLORASTOR) 250 MG capsule Take 1 capsule (250 mg total) by mouth 2 (two) times daily. 30 capsule 0  . silver sulfADIAZINE (SILVADENE) 1 % cream Apply 1 application topically daily. As directed    . traZODone  (DESYREL) 50 MG tablet Take 0.5 tablets by mouth at bedtime as needed.    . umeclidinium-vilanterol (ANORO ELLIPTA) 62.5-25 MCG/INH AEPB Inhale 1 puff into the lungs daily.     No facility-administered medications prior to visit.      Allergies:   Penicillins   Social History   Socioeconomic History  . Marital status: Married    Spouse name: Not on file  . Number of children: 0  . Years of education: Not on file  . Highest education level: Not on file  Occupational History  . Occupation: disabled    Fish farm manager: UNEMPLOYED  Social Needs  . Financial resource strain: Not on file  . Food insecurity  Worry: Not on file    Inability: Not on file  . Transportation needs    Medical: Not on file    Non-medical: Not on file  Tobacco Use  . Smoking status: Current Every Day Smoker    Packs/day: 1.00    Years: 30.00    Pack years: 30.00    Types: Cigarettes  . Smokeless tobacco: Never Used  . Tobacco comment: only smokes about 1-2 a day  Substance and Sexual Activity  . Alcohol use: No  . Drug use: No  . Sexual activity: Not on file  Lifestyle  . Physical activity    Days per week: Not on file    Minutes per session: Not on file  . Stress: Not on file  Relationships  . Social Herbalist on phone: Not on file    Gets together: Not on file    Attends religious service: Not on file    Active member of club or organization: Not on file    Attends meetings of clubs or organizations: Not on file    Relationship status: Not on file  Other Topics Concern  . Not on file  Social History Narrative  . Not on file     Family History:  The patient's ***family history includes Asthma in his brother; Cancer in his father; Cirrhosis in his brother; Colon cancer in his maternal aunt; Heart attack in his brother; Heart failure in his mother; Thyroid disease in his mother.   Review of Systems:   Please see the history of present illness.     General:  No chills, fever, night  sweats or weight changes.  Cardiovascular:  No chest pain, dyspnea on exertion, edema, orthopnea, palpitations, paroxysmal nocturnal dyspnea. Dermatological: No rash, lesions/masses Respiratory: No cough, dyspnea Urologic: No hematuria, dysuria Abdominal:   No nausea, vomiting, diarrhea, bright red blood per rectum, melena, or hematemesis Neurologic:  No visual changes, wkns, changes in mental status. All other systems reviewed and are otherwise negative except as noted above.   Physical Exam:    VS:  There were no vitals taken for this visit.   General: Well developed, well nourished,male appearing in no acute distress. Head: Normocephalic, atraumatic, sclera non-icteric, no xanthomas, nares are without discharge.  Neck: No carotid bruits. JVD not elevated.  Lungs: Respirations regular and unlabored, without wheezes or rales.  Heart: ***Regular rate and rhythm. No S3 or S4.  No murmur, no rubs, or gallops appreciated. Abdomen: Soft, non-tender, non-distended with normoactive bowel sounds. No hepatomegaly. No rebound/guarding. No obvious abdominal masses. Msk:  Strength and tone appear normal for age. No joint deformities or effusions. Extremities: No clubbing or cyanosis. No edema.  Distal pedal pulses are 2+ bilaterally. Neuro: Alert and oriented X 3. Moves all extremities spontaneously. No focal deficits noted. Psych:  Responds to questions appropriately with a normal affect. Skin: No rashes or lesions noted  Wt Readings from Last 3 Encounters:  01/23/19 222 lb 3.6 oz (100.8 kg)  04/24/18 213 lb (96.6 kg)  02/17/18 223 lb 1.7 oz (101.2 kg)        Studies/Labs Reviewed:   EKG:  EKG is*** ordered today.  The ekg ordered today demonstrates ***  Recent Labs: 04/24/2018: B Natriuretic Peptide 142.0; Magnesium 2.2 01/21/2019: TSH 1.000 01/22/2019: ALT 21; BUN 7; Creatinine, Ser 0.59; Potassium 4.3; Sodium 142 01/23/2019: Hemoglobin 12.4; Platelets 59   Lipid Panel    Component  Value Date/Time   CHOL  08/11/2010 0434  131        ATP III CLASSIFICATION:  <200     mg/dL   Desirable  200-239  mg/dL   Borderline High  >=240    mg/dL   High          TRIG 78 08/11/2010 0434   HDL 26 (L) 08/11/2010 0434   CHOLHDL 5.0 08/11/2010 0434   VLDL 16 08/11/2010 0434   LDLCALC  08/11/2010 0434    89        Total Cholesterol/HDL:CHD Risk Coronary Heart Disease Risk Table                     Men   Women  1/2 Average Risk   3.4   3.3  Average Risk       5.0   4.4  2 X Average Risk   9.6   7.1  3 X Average Risk  23.4   11.0        Use the calculated Patient Ratio above and the CHD Risk Table to determine the patient's CHD Risk.        ATP III CLASSIFICATION (LDL):  <100     mg/dL   Optimal  100-129  mg/dL   Near or Above                    Optimal  130-159  mg/dL   Borderline  160-189  mg/dL   High  >190     mg/dL   Very High    Additional studies/ records that were reviewed today include:   Echocardiogram: 01/22/2019 IMPRESSIONS    1. The left ventricle has normal systolic function with an ejection fraction of 60-65%. The cavity size was normal. Severe basal septal hypertrophy. Otherwise, moderate hypertrophy of remaining myocardium. Left ventricular diastolic function could not  be evaluated secondary to atrial fibrillation. No evidence of left ventricular regional wall motion abnormalities.  2. The right ventricle has normal systolic function. The cavity was normal. There is no increase in right ventricular wall thickness.  3. Left atrial size was mildly dilated.  4. Right atrial size was mildly dilated.  5. The mitral valve is grossly normal. Mild thickening of the mitral valve leaflet.  6. The tricuspid valve is grossly normal.  7. The aortic valve is tricuspid. Mild thickening of the aortic valve.  8. The aortic root is normal in size and structure.  9. Pulmonary hypertension is mild. 10. The inferior vena cava was dilated in size with <50%  respiratory variability.  Assessment:    No diagnosis found.   Plan:   In order of problems listed above:  1. ***    Medication Adjustments/Labs and Tests Ordered: Current medicines are reviewed at length with the patient today.  Concerns regarding medicines are outlined above.  Medication changes, Labs and Tests ordered today are listed in the Patient Instructions below. There are no Patient Instructions on file for this visit.   Signed, Erma Heritage, PA-C  02/22/2019 11:39 AM    [facility] [address] [place], [place] [address] Phone: [phone] Fax: [phone]

## 2020-04-29 ENCOUNTER — Emergency Department (HOSPITAL_COMMUNITY)
Admission: EM | Admit: 2020-04-29 | Discharge: 2020-04-29 | Disposition: A | Discharge status: Home / Self Care | Payer: Medicare HMO | Attending: Emergency Medicine | Admitting: Emergency Medicine

## 2020-04-29 ENCOUNTER — Encounter (HOSPITAL_COMMUNITY): Payer: Self-pay | Admitting: *Deleted

## 2020-04-29 DIAGNOSIS — Z79899 Other long term (current) drug therapy: Secondary | ICD-10-CM | POA: Insufficient documentation

## 2020-04-29 DIAGNOSIS — M545 Low back pain, unspecified: Secondary | ICD-10-CM

## 2020-04-29 DIAGNOSIS — E119 Type 2 diabetes mellitus without complications: Secondary | ICD-10-CM | POA: Diagnosis not present

## 2020-04-29 DIAGNOSIS — F17210 Nicotine dependence, cigarettes, uncomplicated: Secondary | ICD-10-CM | POA: Insufficient documentation

## 2020-04-29 DIAGNOSIS — J449 Chronic obstructive pulmonary disease, unspecified: Secondary | ICD-10-CM | POA: Diagnosis not present

## 2020-04-29 DIAGNOSIS — M549 Dorsalgia, unspecified: Secondary | ICD-10-CM | POA: Diagnosis present

## 2020-04-29 DIAGNOSIS — I10 Essential (primary) hypertension: Secondary | ICD-10-CM | POA: Diagnosis not present

## 2020-04-29 MED ORDER — HYDROCODONE-ACETAMINOPHEN 5-325 MG PO TABS: 1.0000 | ORAL_TABLET | Freq: Four times a day (QID) | ORAL | 0 refills | Status: DC | PRN

## 2020-04-29 MED ORDER — HYDROCODONE-ACETAMINOPHEN 5-325 MG PO TABS
1.0000 | ORAL_TABLET | Freq: Once | ORAL | Status: AC
  Administered 2020-04-29: 1 via ORAL
  Filled 2020-04-29: qty 1

## 2020-04-29 NOTE — ED Triage Notes (Signed)
Brought in via EMS, states he cannot see, and cannot hear, states he has back pain and also is out of pain medication, states he now longer can have pain medication and nerve medication at the same time. Regrets he chose to have nerve medication rather than pain pills

## 2020-04-29 NOTE — Discharge Instructions (Signed)
Follow up with a family md in 1-2 weeks for recheck.   Follow up with your eye md

## 2020-04-29 NOTE — ED Notes (Signed)
Patients wife, [place], called inquiring about patient. Advised patient still in waiting room at this time. She wanted to leave her number in case she was needed [phone]

## 2020-04-29 NOTE — ED Provider Notes (Signed)
[facility] EMERGENCY DEPARTMENT Provider Note   CSN: [id number] Arrival date & time: 04/29/20  1318     History Chief Complaint  Patient presents with  . Hearing Problem    [name] [name] is a 59 y.o. male.  Pt complains of low back pain,  Pt is out of medicine and has a hx of back pain   Back Pain Location:  Generalized Quality:  Aching Radiates to:  Does not radiate Pain severity:  Mild Pain is:  Worse during the day Onset quality:  Unable to specify Timing:  Intermittent Progression:  Waxing and waning Chronicity:  Recurrent Context: not emotional stress   Relieved by:  Nothing Worsened by:  Nothing Ineffective treatments:  None tried Associated symptoms: no abdominal pain, no chest pain and no headaches        Past Medical History:  Diagnosis Date  . Anemia   . Ankle deformity, right    chronic  . Anxiety   . Arthritis   . Bipolar 1 disorder ([place])   . Blind right eye   . Blood clots in brain    per patient  . Cardiomegaly   . Cataract    Right eye  . Cerebrovascular disease    Right vertebral artery  . Cholelithiasis    Asymptomatic  . Chronic back pain   . Chronic pain   . Cirrhosis ([place])    suspected NASH  . Cirrhosis ([place])    diagnosed 07/2010 when presented with first episode of hepatic encephalopathy, afp on 416/13=4.8,  U/S on 12/14/11  liver stable, pt has received 2 hep A/B vaccines  . COPD (chronic obstructive pulmonary disease) ([place])   . Dementia ([place])   . Depression   . Diabetes mellitus   . Diverticulosis   . Dyspnea   . GERD (gastroesophageal reflux disease)   . Hard of hearing   . Hepatic encephalopathy ([place])   . Hepatitis   . Hyperlipidemia   . Hypertension   . Lymphedema    R>L  . Mental retardation   . Noncompliance   . Seizures ([place])   . Thrombocytopenia [facility])     Patient Active Problem List   Diagnosis Date Noted  . Atrial fibrillation ([place]) 01/23/2019  . Atrial fibrillation with RVR ([place]) 01/22/2019  .  Chronic pain syndrome   . Cellulitis of right lower extremity 02/17/2018  . Dysphagia 10/10/2017  . Encounter for screening colonoscopy 10/10/2017  . Palliative care encounter   . Goals of care, counseling/discussion   . DNR (do not resuscitate) discussion   . Encounter for hospice care discussion   . Altered mental status 09/11/2017  . Acute encephalopathy 12/29/2016  . Subarachnoid hemorrhage ([place]) 12/29/2016  . Bacteremia 12/29/2016  . Fall 12/21/2016  . Cerebral contusion ([place]) 12/21/2016  . Pressure injury of skin 12/14/2016  . Anxiety   . Suicidal ideation   . Acute hepatic encephalopathy ([place]) 08/02/2013  . Hypokalemia 02/01/2013  . Hyperammonemia ([place]) 01/31/2013  . Urinary incontinence 01/31/2013  . Pancytopenia ([place]) 09/17/2012  . Edema of right lower extremity 04/28/2012  . Hepatic encephalopathy ([place]) 04/28/2012  . Arthritis of ankle or foot, degenerative 04/26/2012  . Hematemesis 12/07/2011  . Thrombocytopenia ([place]) 12/07/2011  . Anemia 12/07/2011  . COPD (chronic obstructive pulmonary disease) ([place]) 08/25/2011  . DM (diabetes mellitus) ([place]) 08/25/2011  . HTN (hypertension) 08/25/2011  . Coagulopathy ([place]) 08/25/2011  . Cirrhosis ([place]) 08/25/2011  . Depression 08/25/2011  . Seizure disorder ([place]) 08/25/2011  . GERD (  gastroesophageal reflux disease) 08/25/2011  . Tobacco abuse 08/25/2011  . Ankle fracture, bimalleolar, closed 08/04/2011    Past Surgical History:  Procedure Laterality Date  . CAST APPLICATION  10/29/4663   Procedure: MINOR CAST APPLICATION;  Surgeon: [name], MD;  Location: [facility] ORS;  Service: Orthopedics;  Laterality: Right;  no anesthesia , procedure room do not need or room !  . COLONOSCOPY  12/15/11   colonic divericulosis;poor prep, ACBE  recommended but has not been done  . COLONOSCOPY WITH PROPOFOL N/A 11/07/2017   Dr. Gala Romney: inadequate prep, internal hemorrhoids. Early interval due March 2020 due to poor prep  . ear operations      . ESOPHAGOGASTRODUODENOSCOPY  02/02/2011   Rourk-Normal esophagus.  No varices/Question mild portal gastropathy, extrinsic compression on the antrum, lesser curvature of uncertain significance, some minimally  nodular mucosa status post biopsy, patent pylorus, normal duodenum 1 and duodenum 2.  . ESOPHAGOGASTRODUODENOSCOPY  12/15/11   Rourk-->mild changes of portal gastropathy, gastric/duodenal erosions s/p bx  (reactive changes with mild chronic inflammation. No H.pylori  . ESOPHAGOGASTRODUODENOSCOPY (EGD) WITH PROPOFOL N/A 11/07/2017   Dr. Gala Romney: normal esophagus s/p dilation, portal hypertensive gastropathy, erosive gastropathy, normal duodenum  . FOOT SURGERY    . MALONEY DILATION N/A 11/07/2017   Procedure: Venia Minks DILATION;  Surgeon: Daneil Dolin, MD;  Location: [facility] ENDO SUITE;  Service: Endoscopy;  Laterality: N/A;  . ORIF ANKLE FRACTURE  08/27/2011   Procedure: OPEN REDUCTION INTERNAL FIXATION (ORIF) ANKLE FRACTURE;  Surgeon: [name], MD;  Location: [facility] ORS;  Service: Orthopedics;  Laterality: Right;  . TONSILLECTOMY         Family History  Problem Relation Age of Onset  . Heart failure Mother   . Thyroid disease Mother   . Cancer Father   . Asthma Brother   . Heart attack Brother   . Cirrhosis Brother        EtOH  . Colon cancer Maternal Aunt     Social History   Tobacco Use  . Smoking status: Current Every Day Smoker    Packs/day: 1.00    Years: 30.00    Pack years: 30.00    Types: Cigarettes  . Smokeless tobacco: Never Used  . Tobacco comment: only smokes about 1-2 a day  Vaping Use  . Vaping Use: Never used  Substance Use Topics  . Alcohol use: No  . Drug use: No    Home Medications Prior to Admission medications   Medication Sig Start Date End Date Taking? Authorizing Provider  albuterol (PROVENTIL HFA;VENTOLIN HFA) 108 (90 Base) MCG/ACT inhaler Inhale 2 puffs into the lungs every 4 (four) hours as needed for wheezing or shortness of breath.     [provider]  ALPRAZolam Duanne Moron) 0.5 MG tablet Take 1 tablet by mouth 3 (three) times daily as needed. 01/23/18   [provider]  ammonium lactate (AMLACTIN) 12 % cream Apply 1 application topically daily as needed. 03/27/18   [provider]  ezetimibe (ZETIA) 10 MG tablet Take 10 mg by mouth daily.    [provider]  furosemide (LASIX) 20 MG tablet Take 20 mg by mouth daily.    [provider]  HYDROcodone-acetaminophen (NORCO/VICODIN) 5-325 MG tablet Take 1 tablet by mouth every 6 (six) hours as needed. 04/29/20   Milton Ferguson, MD  isosorbide dinitrate (ISORDIL) 10 MG tablet Take 1 tablet (10 mg total) by mouth 2 (two) times daily. 01/23/19   Roxan Hockey, MD  lactulose ([place]) 10 GM/15ML  solution Take 60 mLs (40 g total) by mouth 3 (three) times daily. Titrate for 3-4 bowel motions daily. 01/23/19   Roxan Hockey, MD  metoprolol tartrate (LOPRESSOR) 100 MG tablet Take 1 tablet (100 mg total) by mouth 2 (two) times daily. 01/23/19   Roxan Hockey, MD  ondansetron (ZOFRAN) 4 MG tablet Take 1 tablet (4 mg total) by mouth every 6 (six) hours as needed for nausea. 01/23/19   Roxan Hockey, MD  oxyCODONE (OXY IR/ROXICODONE) 5 MG immediate release tablet Take 1 tablet by mouth 3 (three) times daily.  10/04/17   [provider]  pantoprazole (PROTONIX) 40 MG tablet Take 40 mg by mouth daily.    [provider]  potassium chloride SA (K-DUR) 20 MEQ tablet Take 1 tablet (20 mEq total) by mouth daily. 01/23/19   Roxan Hockey, MD  QUEtiapine (SEROQUEL) 25 MG tablet Take 1 tablet (25 mg total) by mouth at bedtime. Patient taking differently: Take 50 mg by mouth at bedtime.  12/31/16   Samuella Cota, MD  rifaximin (XIFAXAN) 550 MG TABS tablet Take 1 tablet (550 mg total) by mouth 2 (two) times daily. 01/23/19   Roxan Hockey, MD  saccharomyces boulardii (FLORASTOR) 250 MG capsule Take 1 capsule (250 mg total) by mouth 2 (two) times  daily. 02/18/18   Barton Dubois, MD  silver sulfADIAZINE (SILVADENE) 1 % cream Apply 1 application topically daily. As directed 01/06/18   [provider]  traZODone (DESYREL) 50 MG tablet Take 0.5 tablets by mouth at bedtime as needed. 02/20/18   [provider]  umeclidinium-vilanterol (ANORO ELLIPTA) 62.5-25 MCG/INH AEPB Inhale 1 puff into the lungs daily. 10/06/17   [provider]    Allergies    Penicillins  Review of Systems   Review of Systems  Constitutional: Negative for appetite change and fatigue.  HENT: Negative for congestion, ear discharge and sinus pressure.   Eyes: Negative for discharge.  Respiratory: Negative for cough.   Cardiovascular: Negative for chest pain.  Gastrointestinal: Negative for abdominal pain and diarrhea.  Genitourinary: Negative for frequency and hematuria.  Musculoskeletal: Positive for back pain.  Skin: Negative for rash.  Neurological: Negative for seizures and headaches.  Psychiatric/Behavioral: Negative for hallucinations.    Physical Exam Updated Vital Signs BP (!) 163/95 (BP Location: Right Arm)   Pulse (!) 53   Temp 98.4 F (36.9 C) (Oral)   Resp 18   Ht 5' 7"  (1.702 m)   Wt 111.1 kg   SpO2 100%   BMI 38.37 kg/m   Physical Exam Vitals reviewed.  Constitutional:      Appearance: He is well-developed.  HENT:     Head: Normocephalic.     Nose: Nose normal.  Eyes:     General: No scleral icterus.    Conjunctiva/sclera: Conjunctivae normal.     Comments: Pt's right eye has no pupil response a appears blind in that eye  Neck:     Thyroid: No thyromegaly.  Cardiovascular:     Rate and Rhythm: Normal rate and regular rhythm.     Heart sounds: No murmur heard.  No friction rub. No gallop.   Pulmonary:     Breath sounds: No stridor. No wheezing or rales.  Chest:     Chest wall: No tenderness.  Abdominal:     General: There is no distension.     Tenderness: There is no abdominal tenderness. There is  no rebound.  Musculoskeletal:        General: Normal  range of motion.     Cervical back: Neck supple.     Comments: Minor lumbar pain  Lymphadenopathy:     Cervical: No cervical adenopathy.  Skin:    Findings: No erythema or rash.  Neurological:     Mental Status: He is alert and oriented to person, place, and time.     Motor: No abnormal muscle tone.     Coordination: Coordination normal.  Psychiatric:        Behavior: Behavior normal.     ED Results / Procedures / Treatments   Labs (all labs ordered are listed, but only abnormal results are displayed) Labs Reviewed - No data to display  EKG None  Radiology No results found.  Procedures Procedures (including critical care time)  Medications Ordered in ED Medications  HYDROcodone-acetaminophen (NORCO/VICODIN) 5-325 MG per tablet 1 tablet (has no administration in time range)    ED Course  I have reviewed the triage vital signs and the nursing notes.  Pertinent labs & imaging results that were available during my care of the patient were reviewed by me and considered in my medical decision making (see chart for details).    MDM Rules/Calculators/A&P                          Pt with exacerbation of lumbar pain.   And a blind eye.  He will follow up with a family md and his eye md Final Clinical Impression(s) / ED Diagnoses Final diagnoses:  Back pain at L4-L5 level    Rx / DC Orders ED Discharge Orders         Ordered    HYDROcodone-acetaminophen (NORCO/VICODIN) 5-325 MG tablet  Every 6 hours PRN        04/29/20 2044           Milton Ferguson, MD 04/29/20 2055

## 2020-09-07 ENCOUNTER — Inpatient Hospital Stay (HOSPITAL_COMMUNITY)
Admission: EM | Admit: 2020-09-07 | Discharge: 2020-09-11 | DRG: 441 | Disposition: A | Discharge status: Skilled Nursing Facility | Payer: Medicare HMO | Attending: Family Medicine | Admitting: Family Medicine

## 2020-09-07 ENCOUNTER — Encounter (HOSPITAL_COMMUNITY): Payer: Self-pay | Admitting: Emergency Medicine

## 2020-09-07 DIAGNOSIS — K219 Gastro-esophageal reflux disease without esophagitis: Secondary | ICD-10-CM | POA: Diagnosis present

## 2020-09-07 DIAGNOSIS — Z825 Family history of asthma and other chronic lower respiratory diseases: Secondary | ICD-10-CM

## 2020-09-07 DIAGNOSIS — F79 Unspecified intellectual disabilities: Secondary | ICD-10-CM | POA: Diagnosis present

## 2020-09-07 DIAGNOSIS — J449 Chronic obstructive pulmonary disease, unspecified: Secondary | ICD-10-CM | POA: Diagnosis present

## 2020-09-07 DIAGNOSIS — D696 Thrombocytopenia, unspecified: Secondary | ICD-10-CM | POA: Diagnosis present

## 2020-09-07 DIAGNOSIS — K3189 Other diseases of stomach and duodenum: Secondary | ICD-10-CM | POA: Diagnosis present

## 2020-09-07 DIAGNOSIS — G8929 Other chronic pain: Secondary | ICD-10-CM | POA: Diagnosis present

## 2020-09-07 DIAGNOSIS — F419 Anxiety disorder, unspecified: Secondary | ICD-10-CM | POA: Diagnosis present

## 2020-09-07 DIAGNOSIS — F17210 Nicotine dependence, cigarettes, uncomplicated: Secondary | ICD-10-CM | POA: Diagnosis present

## 2020-09-07 DIAGNOSIS — G9340 Encephalopathy, unspecified: Secondary | ICD-10-CM

## 2020-09-07 DIAGNOSIS — I679 Cerebrovascular disease, unspecified: Secondary | ICD-10-CM | POA: Diagnosis present

## 2020-09-07 DIAGNOSIS — K7682 Hepatic encephalopathy: Secondary | ICD-10-CM | POA: Diagnosis present

## 2020-09-07 DIAGNOSIS — Z66 Do not resuscitate: Secondary | ICD-10-CM | POA: Diagnosis present

## 2020-09-07 DIAGNOSIS — Z8249 Family history of ischemic heart disease and other diseases of the circulatory system: Secondary | ICD-10-CM

## 2020-09-07 DIAGNOSIS — I110 Hypertensive heart disease with heart failure: Secondary | ICD-10-CM | POA: Diagnosis present

## 2020-09-07 DIAGNOSIS — K766 Portal hypertension: Secondary | ICD-10-CM | POA: Diagnosis present

## 2020-09-07 DIAGNOSIS — I10 Essential (primary) hypertension: Secondary | ICD-10-CM | POA: Diagnosis not present

## 2020-09-07 DIAGNOSIS — E7220 Disorder of urea cycle metabolism, unspecified: Secondary | ICD-10-CM | POA: Diagnosis present

## 2020-09-07 DIAGNOSIS — I5022 Chronic systolic (congestive) heart failure: Secondary | ICD-10-CM | POA: Diagnosis present

## 2020-09-07 DIAGNOSIS — G9341 Metabolic encephalopathy: Secondary | ICD-10-CM | POA: Diagnosis present

## 2020-09-07 DIAGNOSIS — Z20822 Contact with and (suspected) exposure to covid-19: Secondary | ICD-10-CM | POA: Diagnosis not present

## 2020-09-07 DIAGNOSIS — E119 Type 2 diabetes mellitus without complications: Secondary | ICD-10-CM | POA: Diagnosis present

## 2020-09-07 DIAGNOSIS — Z8673 Personal history of transient ischemic attack (TIA), and cerebral infarction without residual deficits: Secondary | ICD-10-CM

## 2020-09-07 DIAGNOSIS — M549 Dorsalgia, unspecified: Secondary | ICD-10-CM | POA: Diagnosis present

## 2020-09-07 DIAGNOSIS — Z79891 Long term (current) use of opiate analgesic: Secondary | ICD-10-CM

## 2020-09-07 DIAGNOSIS — Z8711 Personal history of peptic ulcer disease: Secondary | ICD-10-CM

## 2020-09-07 DIAGNOSIS — I4891 Unspecified atrial fibrillation: Secondary | ICD-10-CM | POA: Diagnosis present

## 2020-09-07 DIAGNOSIS — K7460 Unspecified cirrhosis of liver: Secondary | ICD-10-CM | POA: Diagnosis present

## 2020-09-07 DIAGNOSIS — Z880 Allergy status to penicillin: Secondary | ICD-10-CM

## 2020-09-07 DIAGNOSIS — H5461 Unqualified visual loss, right eye, normal vision left eye: Secondary | ICD-10-CM | POA: Diagnosis present

## 2020-09-07 DIAGNOSIS — M1990 Unspecified osteoarthritis, unspecified site: Secondary | ICD-10-CM | POA: Diagnosis present

## 2020-09-07 DIAGNOSIS — K7290 Hepatic failure, unspecified without coma: Principal | ICD-10-CM | POA: Diagnosis present

## 2020-09-07 DIAGNOSIS — F319 Bipolar disorder, unspecified: Secondary | ICD-10-CM | POA: Diagnosis present

## 2020-09-07 DIAGNOSIS — H9190 Unspecified hearing loss, unspecified ear: Secondary | ICD-10-CM | POA: Diagnosis present

## 2020-09-07 DIAGNOSIS — F0390 Unspecified dementia without behavioral disturbance: Secondary | ICD-10-CM | POA: Diagnosis present

## 2020-09-07 DIAGNOSIS — R319 Hematuria, unspecified: Secondary | ICD-10-CM | POA: Diagnosis present

## 2020-09-07 DIAGNOSIS — G40909 Epilepsy, unspecified, not intractable, without status epilepticus: Secondary | ICD-10-CM | POA: Diagnosis present

## 2020-09-07 DIAGNOSIS — M7989 Other specified soft tissue disorders: Secondary | ICD-10-CM

## 2020-09-07 DIAGNOSIS — R809 Proteinuria, unspecified: Secondary | ICD-10-CM | POA: Diagnosis present

## 2020-09-07 DIAGNOSIS — E785 Hyperlipidemia, unspecified: Secondary | ICD-10-CM | POA: Diagnosis present

## 2020-09-07 DIAGNOSIS — Z79899 Other long term (current) drug therapy: Secondary | ICD-10-CM

## 2020-09-07 DIAGNOSIS — R269 Unspecified abnormalities of gait and mobility: Secondary | ICD-10-CM | POA: Diagnosis present

## 2020-09-07 DIAGNOSIS — R4182 Altered mental status, unspecified: Secondary | ICD-10-CM | POA: Diagnosis not present

## 2020-09-07 LAB — COMPREHENSIVE METABOLIC PANEL
ALT: 26 U/L (ref 0–44)
AST: 66 U/L — ABNORMAL HIGH (ref 15–41)
Albumin: 3.4 g/dL — ABNORMAL LOW (ref 3.5–5.0)
Alkaline Phosphatase: 108 U/L (ref 38–126)
Anion gap: 11 (ref 5–15)
BUN: 12 mg/dL (ref 8–23)
CO2: 19 mmol/L — ABNORMAL LOW (ref 22–32)
Calcium: 9.2 mg/dL (ref 8.9–10.3)
Chloride: 109 mmol/L (ref 98–111)
Creatinine, Ser: 0.72 mg/dL (ref 0.61–1.24)
GFR, Estimated: >60 mL/min (ref >60)
Glucose, Bld: 129 mg/dL — ABNORMAL HIGH (ref 70–99)
Potassium: 4.4 mmol/L (ref 3.5–5.1)
Sodium: 139 mmol/L (ref 135–145)
Total Bilirubin: 4.2 mg/dL — ABNORMAL HIGH (ref 0.3–1.2)
Total Protein: 6.9 g/dL (ref 6.5–8.1)

## 2020-09-07 LAB — CBC WITH DIFFERENTIAL/PLATELET
Abs Immature Granulocytes: 0.02 10*3/uL (ref 0.00–0.07)
Basophils Absolute: 0 10*3/uL (ref 0.0–0.1)
Basophils Relative: 0 %
Eosinophils Absolute: 0 10*3/uL (ref 0.0–0.5)
Eosinophils Relative: 0 %
HCT: 45.4 % (ref 39.0–52.0)
Hemoglobin: 15.5 g/dL (ref 13.0–17.0)
Immature Granulocytes: 0 %
Lymphocytes Relative: 9 %
Lymphs Abs: 0.5 10*3/uL — ABNORMAL LOW (ref 0.7–4.0)
MCH: 36.5 pg — ABNORMAL HIGH (ref 26.0–34.0)
MCHC: 34.1 g/dL (ref 30.0–36.0)
MCV: 106.8 fL — ABNORMAL HIGH (ref 80.0–100.0)
Monocytes Absolute: 0.3 10*3/uL (ref 0.1–1.0)
Monocytes Relative: 6 %
Neutro Abs: 5.1 10*3/uL (ref 1.7–7.7)
Neutrophils Relative %: 85 %
Platelets: 44 10*3/uL — ABNORMAL LOW (ref 150–400)
RBC: 4.25 MIL/uL (ref 4.22–5.81)
RDW: 14.5 % (ref 11.5–15.5)
WBC: 5.9 10*3/uL (ref 4.0–10.5)
nRBC: 0 % (ref 0.0–0.2)

## 2020-09-07 LAB — URINALYSIS, ROUTINE W REFLEX MICROSCOPIC
Bilirubin Urine: NEGATIVE
Glucose, UA: NEGATIVE mg/dL
Ketones, ur: NEGATIVE mg/dL
Leukocytes,Ua: NEGATIVE
Nitrite: NEGATIVE
Protein, ur: 100 mg/dL — AB
RBC / HPF: >50 RBC/hpf — ABNORMAL HIGH (ref 0–5)
Specific Gravity, Urine: 1.018 (ref 1.005–1.030)
pH: 6 (ref 5.0–8.0)

## 2020-09-07 LAB — RESP PANEL BY RT-PCR (FLU A&B, COVID) ARPGX2
Influenza A by PCR: NEGATIVE
Influenza B by PCR: NEGATIVE
SARS Coronavirus 2 by RT PCR: NEGATIVE

## 2020-09-07 LAB — AMMONIA: Ammonia: 110 umol/L — ABNORMAL HIGH (ref 9–35)

## 2020-09-07 MED ORDER — ALBUTEROL SULFATE HFA 108 (90 BASE) MCG/ACT IN AERS: 2.0000 | INHALATION_SPRAY | RESPIRATORY_TRACT | Status: DC | PRN

## 2020-09-07 MED ORDER — RIFAXIMIN 550 MG PO TABS
550.0000 mg | ORAL_TABLET | Freq: Two times a day (BID) | ORAL | Status: DC
  Administered 2020-09-08 – 2020-09-11 (×7): 550 mg via ORAL
  Filled 2020-09-07 (×7): qty 1

## 2020-09-07 MED ORDER — LACTULOSE ENEMA
300.0000 mL | Freq: Two times a day (BID) | ORAL | Status: DC
  Administered 2020-09-08: 300 mL via RECTAL
  Filled 2020-09-07 (×6): qty 300

## 2020-09-07 MED ORDER — PANTOPRAZOLE SODIUM 40 MG PO TBEC
40.0000 mg | DELAYED_RELEASE_TABLET | Freq: Every day | ORAL | Status: DC
  Administered 2020-09-08 – 2020-09-11 (×4): 40 mg via ORAL
  Filled 2020-09-07 (×4): qty 1

## 2020-09-07 MED ORDER — METOPROLOL TARTRATE 50 MG PO TABS
100.0000 mg | ORAL_TABLET | Freq: Two times a day (BID) | ORAL | Status: DC
  Administered 2020-09-08 – 2020-09-11 (×6): 100 mg via ORAL
  Filled 2020-09-07 (×7): qty 2

## 2020-09-07 MED ORDER — UMECLIDINIUM-VILANTEROL 62.5-25 MCG/INH IN AEPB
1.0000 | INHALATION_SPRAY | Freq: Every day | RESPIRATORY_TRACT | Status: DC
  Administered 2020-09-08 – 2020-09-09 (×2): 1 via RESPIRATORY_TRACT
  Filled 2020-09-07: qty 14

## 2020-09-07 MED ORDER — LACTULOSE 10 GM/15ML PO SOLN
30.0000 g | ORAL | Status: AC
  Administered 2020-09-07: 30 g via ORAL
  Filled 2020-09-07: qty 60

## 2020-09-07 NOTE — H&P (Signed)
History and Physical  [name] [name] TFT:[id number] DOB: 05/04/1961 DOA: 09/07/2020  Referring physician: Noemi Chapel, MD PCP: Hermine Messick, MD  Patient coming from: Home  Chief Complaint: Altered mental status  HPI: [name] [name] is a 60 y.o. male with medical history significant for liver cirrhosis, seizure disorder, thrombocytopenia, history of hepatitis, hepatic encephalopathy, type 2 diabetes mellitus, COPD, dementia, CVA, chronic systolic heart failure, hypertension urgency department due to patient today, patient lives at home with spouse and ED was noted to be sitting on the commode from 11 AM this morning till around 8 PM, wife activated EMS so that patient could be helped to the bed considering that he has been sitting on the commode for about 9 hours, on arrival of EMS, patient was noted to be more confused. Patient usually was able to take care of most of his ADLs without assistance. Reason for sitting on the commode for 9 hours was unknown. There was no complaint of chest pain, shortness of breath, nausea, vomiting or diarrhea.  ED Course: In the emergency department, BP was 154/93, otherwise other vital signs are within normal range. Work-up in the ED showed thrombocytopenia, MCV 106.8, ammonia level 100, T bili 4.2, AST 66, ALT 26, ALP 108. Urinalysis showed proteinuria and hematuria. Lactulose was given. Hospitalist was asked admit patient for further evaluation and management.  Review of Systems: This cannot be obtained at this time due to patient's current condition.  Past Medical History:  Diagnosis Date  . Anemia   . Ankle deformity, right    chronic  . Anxiety   . Arthritis   . Bipolar 1 disorder ([place])   . Blind right eye   . Blood clots in brain    per patient  . Cardiomegaly   . Cataract    Right eye  . Cerebrovascular disease    Right vertebral artery  . Cholelithiasis    Asymptomatic  . Chronic back pain   . Chronic pain   . Cirrhosis ([place])     suspected NASH  . Cirrhosis ([place])    diagnosed 07/2010 when presented with first episode of hepatic encephalopathy, afp on 416/13=4.8,  U/S on 12/14/11  liver stable, pt has received 2 hep A/B vaccines  . COPD (chronic obstructive pulmonary disease) ([place])   . Dementia ([place])   . Depression   . Diabetes mellitus   . Diverticulosis   . Dyspnea   . GERD (gastroesophageal reflux disease)   . Hard of hearing   . Hepatic encephalopathy ([place])   . Hepatitis   . Hyperlipidemia   . Hypertension   . Lymphedema    R>L  . Mental retardation   . Noncompliance   . Seizures ([place])   . Thrombocytopenia ([place])    Past Surgical History:  Procedure Laterality Date  . CAST APPLICATION  7/0/6237   Procedure: MINOR CAST APPLICATION;  Surgeon: [name], MD;  Location: [facility];  Service: Orthopedics;  Laterality: Right;  no anesthesia , procedure room do not need or room !  . COLONOSCOPY  12/15/11   colonic divericulosis;poor prep, ACBE  recommended but has not been done  . COLONOSCOPY WITH PROPOFOL N/A 11/07/2017   Dr. Gala Romney: inadequate prep, internal hemorrhoids. Early interval due March 2020 due to poor prep  . ear operations    . ESOPHAGOGASTRODUODENOSCOPY  02/02/2011   [place]k-Normal esophagus.  No varices/Question mild portal gastropathy, extrinsic compression on the antrum, lesser curvature of uncertain significance, some minimally  nodular mucosa status post  biopsy, patent pylorus, normal duodenum 1 and duodenum 2.  . ESOPHAGOGASTRODUODENOSCOPY  12/15/11   [place]ourk-->mild changes of portal gastropathy, gastric/duodenal erosions s/p bx  (reactive changes with mild chronic inflammation. No H.pylori  . ESOPHAGOGASTRODUODENOSCOPY (EGD) WITH PROPOFOL N/A 11/07/2017   Dr. Gala Romney: normal esophagus s/p dilation, portal hypertensive gastropathy, erosive gastropathy, normal duodenum  . FOOT SURGERY    . MALONEY DILATION N/A 11/07/2017   Procedure: Venia Minks DILATION;  Surgeon: Daneil Dolin, MD;  Location: [facility]  ENDO SUITE;  Service: Endoscopy;  Laterality: N/A;  . ORIF ANKLE FRACTURE  08/27/2011   Procedure: OPEN REDUCTION INTERNAL FIXATION (ORIF) ANKLE FRACTURE;  Surgeon: [name], MD;  Location: [facility];  Service: Orthopedics;  Laterality: Right;  . TONSILLECTOMY      Social History:  reports that he has been smoking cigarettes. He has a 30.00 pack-year smoking history. He has never used smokeless tobacco. He reports that he does not drink alcohol and does not use drugs.   Allergies  Allergen Reactions  . Penicillins Rash    Has patient had a PCN reaction causing immediate rash, facial/tongue/throat swelling, SOB or lightheadedness with hypotension: Yes Has patient had a PCN reaction causing severe rash involving mucus membranes or skin necrosis: No Has patient had a PCN reaction that required hospitalization No Has patient had a PCN reaction occurring within the last 10 years: No If all of the above answers are "NO", then may proceed with Cephalosporin use.     Family History  Problem Relation Age of Onset  . Heart failure Mother   . Thyroid disease Mother   . Cancer Father   . Asthma Brother   . Heart attack Brother   . Cirrhosis Brother        EtOH  . Colon cancer Maternal Aunt      Prior to Admission medications   Medication Sig Start Date End Date Taking? Authorizing Provider  albuterol (PROVENTIL HFA;VENTOLIN HFA) 108 (90 Base) MCG/ACT inhaler Inhale 2 puffs into the lungs every 4 (four) hours as needed for wheezing or shortness of breath.    [provider]  ALPRAZolam Duanne Moron) 0.5 MG tablet Take 1 tablet by mouth 3 (three) times daily as needed. 01/23/18   [provider]  ammonium lactate (AMLACTIN) 12 % cream Apply 1 application topically daily as needed. 03/27/18   [provider]  ezetimibe (ZETIA) 10 MG tablet Take 10 mg by mouth daily.    [provider]  furosemide (LASIX) 20 MG tablet Take 20 mg by mouth daily.    [provider]  HYDROcodone-acetaminophen (NORCO/VICODIN) 5-325 MG tablet Take 1 tablet by mouth every 6 (six) hours as needed. 04/29/20   Milton Ferguson, MD  isosorbide dinitrate (ISORDIL) 10 MG tablet Take 1 tablet (10 mg total) by mouth 2 (two) times daily. 01/23/19   Roxan Hockey, MD  lactulose (CHRONULAC) 10 GM/15ML solution Take 60 mLs (40 g total) by mouth 3 (three) times daily. Titrate for 3-4 bowel motions daily. 01/23/19   Roxan Hockey, MD  metoprolol tartrate (LOPRESSOR) 100 MG tablet Take 1 tablet (100 mg total) by mouth 2 (two) times daily. 01/23/19   Roxan Hockey, MD  ondansetron (ZOFRAN) 4 MG tablet Take 1 tablet (4 mg total) by mouth every 6 (six) hours as needed for nausea. 01/23/19   Roxan Hockey, MD  oxyCODONE (OXY IR/ROXICODONE) 5 MG immediate release tablet Take 1 tablet by mouth 3 (three) times daily.  10/04/17   [provider]  pantoprazole (PROTONIX) 40 MG tablet Take 40 mg by mouth daily.    [provider]  potassium chloride SA (K-DUR) 20 MEQ tablet Take 1 tablet (20 mEq total) by mouth daily. 01/23/19   Roxan Hockey, MD  QUEtiapine (SEROQUEL) 25 MG tablet Take 1 tablet (25 mg total) by mouth at bedtime. Patient taking differently: Take 50 mg by mouth at bedtime.  12/31/16   Samuella Cota, MD  rifaximin (XIFAXAN) 550 MG TABS tablet Take 1 tablet (550 mg total) by mouth 2 (two) times daily. 01/23/19   Roxan Hockey, MD  saccharomyces boulardii (FLORASTOR) 250 MG capsule Take 1 capsule (250 mg total) by mouth 2 (two) times daily. 02/18/18   Barton Dubois, MD  silver sulfADIAZINE (SILVADENE) 1 % cream Apply 1 application topically daily. As directed 01/06/18   [provider]  traZODone (DESYREL) 50 MG tablet Take 0.5 tablets by mouth at bedtime as needed. 02/20/18   [provider]  umeclidinium-vilanterol (ANORO ELLIPTA) 62.5-25 MCG/INH AEPB Inhale 1 puff into the lungs daily. 10/06/17   [provider]    Physical  Exam: BP (!) 142/87   Pulse 70   Temp 99.9 F (37.7 C) (Rectal)   Resp 14   SpO2 97%   . General: 60 y.o. year-old male awake, but disoriented and in no acute distress.  Marland Kitchen HEENT: Right cataract eye, NCAT, EOMI . Neck: Supple, trachea midline . Cardiovascular: Regular rate and rhythm with no rubs or gallops.  No thyromegaly or JVD noted.  No lower extremity edema. 2/4 pulses in all 4 extremities. Marland Kitchen Respiratory: Clear to auscultation with no wheezes or rales. Good inspiratory effort. . Abdomen: Soft nontender nondistended with normal bowel sounds x4 quadrants. . Muskuloskeletal: +1 edema in RLE with swelling > LLE. Protrusion of right medial malleolus no cyanosis or clubbing  noted bilaterally . Neuro: Sensation intact. Patient uncooperative with neurological exam at this time. . Skin: No ulcerative lesions noted or rashes . Psychiatry: Mood is appropriate for condition and setting          Labs on Admission:  Basic Metabolic Panel: Recent Labs  Lab 09/07/20 2110  NA 139  K 4.4  CL 109  CO2 19*  GLUCOSE 129*  BUN 12  CREATININE 0.72  CALCIUM 9.2   Liver Function Tests: Recent Labs  Lab 09/07/20 2110  AST 66*  ALT 26  ALKPHOS 108  BILITOT 4.2*  PROT 6.9  ALBUMIN 3.4*   No results for input(s): LIPASE, AMYLASE in the last 168 hours. Recent Labs  Lab 09/07/20 2100  AMMONIA 110*   CBC: Recent Labs  Lab 09/07/20 2110  WBC 5.9  NEUTROABS 5.1  HGB 15.5  HCT 45.4  MCV 106.8*  PLT 44*   Cardiac Enzymes: No results for input(s): CKTOTAL, CKMB, CKMBINDEX, TROPONINI in the last 168 hours.  BNP (last 3 results) No results for input(s): BNP in the last 8760 hours.  ProBNP (last 3 results) No results for input(s): PROBNP in the last 8760 hours.  CBG: No results for input(s): GLUCAP in the last 168 hours.  Radiological Exams on Admission: No results found.  EKG: I independently viewed the EKG done and my findings are as followed: Normal sinus rhythm at a  rate of 73 bpm  Assessment/Plan Present on Admission: . Hepatic encephalopathy ([place]) . Hyperammonemia ([place]) . Thrombocytopenia ([place]) . Cirrhosis ([place]) . COPD (chronic obstructive pulmonary disease) ([place]) . GERD (gastroesophageal reflux disease) . HTN (hypertension)  Principal Problem:   Hepatic  encephalopathy ([place]) Active Problems:   COPD (chronic obstructive pulmonary disease) ([facility])   HTN (hypertension)   Cirrhosis ([facility])   GERD (gastroesophageal reflux disease)   Thrombocytopenia ([facility])   Hyperammonemia ([facility])   Encephalopathy possibly secondary to hyperammonemia in the setting of history of liver cirrhosis Continue lactulose rectally for now and transition to oral intake once mental status improves Continue rifaximin once patient is able to tolerate oral intake Patient is currently placed n.p.o. at this time due to being encephalopathic, consider advancing diet once mental status improves  Thrombocytopenia/elevated total bilirubin secondary to above Platelets 44, total bilirubin 4.2 Continue to monitor levels with morning labs  RLE edema and Swelling R/O DVT Lower extremity ultrasound will be done in the morning  Essential hypertension (uncontrolled)  Continue Lopressor per home regimen  GERD Continue Protonix  COPD Continue ventilator and Anoro Ellipta  Hyperlipidemia Statin will be held at this time due to elevated liver enzyme  DVT prophylaxis: SCDs  Code Status: DNR  Family Communication: None at bedside  Disposition Plan:  Patient is from:                        home Anticipated DC to:                    home Anticipated DC date:               2-3 days Anticipated DC barriers:           Patient is unstable to be discharged at this time due to hepatic encephalopathy that requires inpatient management  Consults called: None  Admission status: Inpatient   Bernadette Hoit MD [facility]  09/07/2020, 10:45 PM

## 2020-09-07 NOTE — ED Provider Notes (Signed)
[facility] EMERGENCY DEPARTMENT Provider Note   CSN: [id number] Arrival date & time: 09/07/20  2030     History Chief Complaint  Patient presents with  . Weakness    [name] [name] is a 60 y.o. male.  HPI   This patient is a 60 year old male, he has several medical problems which are pertinent to his presentation today including a history of dementia, diabetes, cirrhosis and a history of bipolar disorder. The patient currently lives with his spouse and the call went out from the spouse to the paramedics today to ask for assistance to get him off of the toilet where he had been sitting from 11:00 AM until somewhere around 8:00 PM when they arrived at his residence. It is unclear why he was sitting there for so long, the patient is unable to give me any information secondary to his dementia thus a level 5 caveat applies.  The paramedics report that the wife wanted him to be helped to the bed, there was no other concern from her at that time and they found normal vital signs although he was cold to the touch. There is no tachycardia hypotension or hypoglycemia. Because of this apparent confusion and weakness they decided to bring the patient to the hospital for evaluation.  Review of the medical record shows that the patient does have a history of hepatic encephalopathy intermittently, possibly with a history of intermittent atrial fibrillation, history of suicidal ideation seen in September at an outside hospital and a history of some chronic pain.    Past Medical History:  Diagnosis Date  . Anemia   . Ankle deformity, right    chronic  . Anxiety   . Arthritis   . Bipolar 1 disorder ([place])   . Blind right eye   . Blood clots in brain    per patient  . Cardiomegaly   . Cataract    Right eye  . Cerebrovascular disease    Right vertebral artery  . Cholelithiasis    Asymptomatic  . Chronic back pain   . Chronic pain   . 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 Cirrhosis ([place])     diagnosed 07/2010 when presented with first episode of hepatic encephalopathy, afp on 416/13=4.8,  U/S on 12/14/11  liver stable, pt has received 2 hep A/B vaccines  . COPD (chronic obstructive pulmonary disease) ([place])   . Dementia ([place])   . Depression   . Diabetes mellitus   . Diverticulosis   . Dyspnea   . GERD (gastroesophageal reflux disease)   . Hard of hearing   . Hepatic encephalopathy ([place])   . Hepatitis   . Hyperlipidemia   . Hypertension   . Lymphedema    R>L  . Mental retardation   . Noncompliance   . Seizures ([place])   . Thrombocytopenia [facility])     Patient Active Problem List   Diagnosis Date Noted  . Atrial fibrillation ([place]) 01/23/2019  . Atrial fibrillation with RVR ([place]) 01/22/2019  . Chronic pain syndrome   . Cellulitis of right lower extremity 02/17/2018  . Dysphagia 10/10/2017  . Encounter for screening colonoscopy 10/10/2017  . Palliative care encounter   . Goals of care, counseling/discussion   . DNR (do not resuscitate) discussion   . Encounter for hospice care discussion   . Altered mental status 09/11/2017  . Acute encephalopathy 12/29/2016  . Subarachnoid hemorrhage ([place]) 12/29/2016  . Bacteremia 12/29/2016  . Fall 12/21/2016  . Cerebral contusion ([place]) 12/21/2016  .  Pressure injury of skin 12/14/2016  . Anxiety   . Suicidal ideation   . Acute hepatic encephalopathy ([place]) 08/02/2013  . Hypokalemia 02/01/2013  . Hyperammonemia ([place]) 01/31/2013  . Urinary incontinence 01/31/2013  . Pancytopenia ([place]) 09/17/2012  . Edema of right lower extremity 04/28/2012  . Hepatic encephalopathy ([place]) 04/28/2012  . Arthritis of ankle or foot, degenerative 04/26/2012  . Hematemesis 12/07/2011  . Thrombocytopenia ([place]) 12/07/2011  . Anemia 12/07/2011  . COPD (chronic obstructive pulmonary disease) ([place]) 08/25/2011  . DM (diabetes mellitus) ([place]) 08/25/2011  . HTN (hypertension) 08/25/2011  . Coagulopathy ([place]) 08/25/2011  . Cirrhosis ([place]) 08/25/2011  .  Depression 08/25/2011  . Seizure disorder ([place]) 08/25/2011  . GERD (gastroesophageal reflux disease) 08/25/2011  . Tobacco abuse 08/25/2011  . Ankle fracture, bimalleolar, closed 08/04/2011    Past Surgical History:  Procedure Laterality Date  . CAST APPLICATION  02/23/1637   Procedure: MINOR CAST APPLICATION;  Surgeon: [name], MD;  Location: [facility] ORS;  Service: Orthopedics;  Laterality: Right;  no anesthesia , procedure room do not need or room !  . COLONOSCOPY  12/15/11   colonic divericulosis;poor prep, ACBE  recommended but has not been done  . COLONOSCOPY WITH PROPOFOL N/A 11/07/2017   Dr. Gala Romney: inadequate prep, internal hemorrhoids. Early interval due March 2020 due to poor prep  . ear operations    . ESOPHAGOGASTRODUODENOSCOPY  02/02/2011   Rourk-Normal esophagus.  No varices/Question mild portal gastropathy, extrinsic compression on the antrum, lesser curvature of uncertain significance, some minimally  nodular mucosa status post biopsy, patent pylorus, normal duodenum 1 and duodenum 2.  . ESOPHAGOGASTRODUODENOSCOPY  12/15/11   Rourk-->mild changes of portal gastropathy, gastric/duodenal erosions s/p bx  (reactive changes with mild chronic inflammation. No H.pylori  . ESOPHAGOGASTRODUODENOSCOPY (EGD) WITH PROPOFOL N/A 11/07/2017   Dr. Gala Romney: normal esophagus s/p dilation, portal hypertensive gastropathy, erosive gastropathy, normal duodenum  . FOOT SURGERY    . MALONEY DILATION N/A 11/07/2017   Procedure: Venia Minks DILATION;  Surgeon: Daneil Dolin, MD;  Location: [facility] ENDO SUITE;  Service: Endoscopy;  Laterality: N/A;  . ORIF ANKLE FRACTURE  08/27/2011   Procedure: OPEN REDUCTION INTERNAL FIXATION (ORIF) ANKLE FRACTURE;  Surgeon: [name], MD;  Location: [facility] ORS;  Service: Orthopedics;  Laterality: Right;  . TONSILLECTOMY         Family History  Problem Relation Age of Onset  . Heart failure Mother   . Thyroid disease Mother   . Cancer Father   . Asthma Brother   .  Heart attack Brother   . Cirrhosis Brother        EtOH  . Colon cancer Maternal Aunt     Social History   Tobacco Use  . Smoking status: Current Every Day Smoker    Packs/day: 1.00    Years: 30.00    Pack years: 30.00    Types: Cigarettes  . Smokeless tobacco: Never Used  . Tobacco comment: only smokes about 1-2 a day  Vaping Use  . Vaping Use: Never used  Substance Use Topics  . Alcohol use: No  . Drug use: No    Home Medications Prior to Admission medications   Medication Sig Start Date End Date Taking? Authorizing Provider  albuterol (PROVENTIL HFA;VENTOLIN HFA) 108 (90 Base) MCG/ACT inhaler Inhale 2 puffs into the lungs every 4 (four) hours as needed for wheezing or shortness of breath.    [provider]  ALPRAZolam Duanne Moron) 0.5 MG tablet Take 1 tablet by mouth 3 (three)  times daily as needed. 01/23/18   [provider]  ammonium lactate (AMLACTIN) 12 % cream Apply 1 application topically daily as needed. 03/27/18   [provider]  ezetimibe (ZETIA) 10 MG tablet Take 10 mg by mouth daily.    [provider]  furosemide (LASIX) 20 MG tablet Take 20 mg by mouth daily.    [provider]  HYDROcodone-acetaminophen (NORCO/VICODIN) 5-325 MG tablet Take 1 tablet by mouth every 6 (six) hours as needed. 04/29/20   Milton Ferguson, MD  isosorbide dinitrate (ISORDIL) 10 MG tablet Take 1 tablet (10 mg total) by mouth 2 (two) times daily. 01/23/19   Roxan Hockey, MD  lactulose (CHRONULAC) 10 GM/15ML solution Take 60 mLs (40 g total) by mouth 3 (three) times daily. Titrate for 3-4 bowel motions daily. 01/23/19   Roxan Hockey, MD  metoprolol tartrate (LOPRESSOR) 100 MG tablet Take 1 tablet (100 mg total) by mouth 2 (two) times daily. 01/23/19   Roxan Hockey, MD  ondansetron (ZOFRAN) 4 MG tablet Take 1 tablet (4 mg total) by mouth every 6 (six) hours as needed for nausea. 01/23/19   Roxan Hockey, MD  oxyCODONE (OXY IR/ROXICODONE) 5 MG immediate  release tablet Take 1 tablet by mouth 3 (three) times daily.  10/04/17   [provider]  pantoprazole (PROTONIX) 40 MG tablet Take 40 mg by mouth daily.    [provider]  potassium chloride SA (K-DUR) 20 MEQ tablet Take 1 tablet (20 mEq total) by mouth daily. 01/23/19   Roxan Hockey, MD  QUEtiapine (SEROQUEL) 25 MG tablet Take 1 tablet (25 mg total) by mouth at bedtime. Patient taking differently: Take 50 mg by mouth at bedtime.  12/31/16   Samuella Cota, MD  rifaximin (XIFAXAN) 550 MG TABS tablet Take 1 tablet (550 mg total) by mouth 2 (two) times daily. 01/23/19   Roxan Hockey, MD  saccharomyces boulardii (FLORASTOR) 250 MG capsule Take 1 capsule (250 mg total) by mouth 2 (two) times daily. 02/18/18   Barton Dubois, MD  silver sulfADIAZINE (SILVADENE) 1 % cream Apply 1 application topically daily. As directed 01/06/18   [provider]  traZODone (DESYREL) 50 MG tablet Take 0.5 tablets by mouth at bedtime as needed. 02/20/18   [provider]  umeclidinium-vilanterol (ANORO ELLIPTA) 62.5-25 MCG/INH AEPB Inhale 1 puff into the lungs daily. 10/06/17   [provider]    Allergies    Penicillins  Review of Systems   Review of Systems  Unable to perform ROS: Dementia    Physical Exam Updated Vital Signs BP (!) 142/87   Pulse 70   Temp 99.9 F (37.7 C) (Rectal)   Resp 14   SpO2 97%   Physical Exam Vitals and nursing note reviewed.  Constitutional:      Appearance: He is well-developed and well-nourished.     Comments: The patient is somnolent, arousable to sternal rub, laughs awkwardly and intermittently at inappropriate times, mumbles incomprehensibly at other times  HENT:     Head: Normocephalic and atraumatic.     Mouth/Throat:     Mouth: Oropharynx is clear and moist.     Pharynx: No oropharyngeal exudate.  Eyes:     General:        Left eye: No discharge.     Extraocular Movements: EOM normal.     Conjunctiva/sclera:  Conjunctivae normal.     Comments: Right eye blind, left eye appears normal  Neck:     Thyroid: No thyromegaly.  Vascular: No JVD.  Cardiovascular:     Rate and Rhythm: Normal rate and regular rhythm.     Pulses: Intact distal pulses.     Heart sounds: Normal heart sounds. No murmur heard. No friction rub. No gallop.   Pulmonary:     Effort: Pulmonary effort is normal. No respiratory distress.     Breath sounds: Normal breath sounds. No wheezing or rales.  Abdominal:     General: Bowel sounds are normal. There is no distension.     Palpations: Abdomen is soft. There is no mass.     Tenderness: There is no abdominal tenderness.     Comments: No distention, no ttp, no masses  Musculoskeletal:        General: No tenderness. Normal range of motion.     Cervical back: Normal range of motion and neck supple.     Right lower leg: Edema present.     Left lower leg: Edema present.     Comments: Scant bilateral lower extremity edema, symmetrical, deformities of the bilateral ankles, chronic  Lymphadenopathy:     Cervical: No cervical adenopathy.  Skin:    General: Skin is warm and dry.     Findings: No erythema or rash.  Neurological:     Coordination: Coordination normal.     Comments: The patient lays somnolent in the bed with his eyes closed, when I try to have him open his mouth he actively resists, he does not follow commands including lifting either arm or lifting either leg  Psychiatric:        Mood and Affect: Mood and affect normal.        Behavior: Behavior normal.     ED Results / Procedures / Treatments   Labs (all labs ordered are listed, but only abnormal results are displayed) Labs Reviewed  CBC WITH DIFFERENTIAL/PLATELET - Abnormal; Notable for the following components:      Result Value   MCV 106.8 (*)    MCH 36.5 (*)    Platelets 44 (*)    Lymphs Abs 0.5 (*)    All other components within normal limits  COMPREHENSIVE METABOLIC PANEL - Abnormal; Notable for  the following components:   CO2 19 (*)    Glucose, Bld 129 (*)    Albumin 3.4 (*)    AST 66 (*)    Total Bilirubin 4.2 (*)    All other components within normal limits  AMMONIA - Abnormal; Notable for the following components:   Ammonia 110 (*)    All other components within normal limits  URINALYSIS, ROUTINE W REFLEX MICROSCOPIC - Abnormal; Notable for the following components:   Color, Urine AMBER (*)    APPearance HAZY (*)    Hgb urine dipstick LARGE (*)    Protein, ur 100 (*)    RBC / HPF >50 (*)    Bacteria, UA RARE (*)    All other components within normal limits  URINE CULTURE  RESP PANEL BY RT-PCR (FLU A&B, COVID) [id number]    EKG EKG Interpretation  Date/Time:  Sunday September 07 2020 20:35:51 EST Ventricular Rate:  73 PR Interval:    QRS Duration: 100 QT Interval:  402 QTC Calculation: 443 R Axis:   76 Text Interpretation: Normal sinus rhythm Nonspecific T abnormalities, anterior leads Baseline wander Otherwise no significant change Confirmed by Noemi Chapel [phone]) on 09/07/2020 9:06:01 PM   Radiology No results found.  Procedures Procedures (including critical care time)  Medications Ordered in ED Medications  lactulose (CHRONULAC) 10 GM/15ML solution 30 g (has no administration in time range)    ED Course  I have reviewed the triage vital signs and the nursing notes.  Pertinent labs & imaging results that were available during my care of the patient were reviewed by me and considered in my medical decision making (see chart for details).    MDM Rules/Calculators/A&P                          The paramedics report that the patient was able to actually help ambulate approximately 15 feet to their stretcher, it was difficult to extricate him from the house secondary to the extensive amount of snow that was on the porch however they were able to successfully get him here.  The patient has no complaints but it is difficult to tell since he is significantly  altered.  The patient has history of liver disease, will check for hepatic cephalopathy, check labs, check core temperature as he does feel cool to the touch.  This patient's laboratory work-up shows that he has chronic liver failure, chronic elevation in his bilirubin and an elevated ammonia level at 11  The urinalysis shows red blood cells but no signs of significant infection.  I have discussed the care with Dr. Josephine Cables of the hospitalist service who is going to admit the patient to the hospital.  Lactulose has been ordered  Final Clinical Impression(s) / ED Diagnoses Final diagnoses:  Acute encephalopathy  Cirrhosis of liver without ascites, unspecified hepatic cirrhosis type ([place])      Noemi Chapel, MD 09/07/20 2231

## 2020-09-07 NOTE — ED Triage Notes (Signed)
Pt to the ED [facility] for weakness.  EMS reports the pt was on the toilet from 1100 until EMS arrived. Pt has a baseline of dementia. EMS states he is more confused than normal. EMS reports he was cold to the touch and in a cold room when they arrived.

## 2020-09-08 ENCOUNTER — Inpatient Hospital Stay (HOSPITAL_COMMUNITY): Payer: Medicare HMO

## 2020-09-08 LAB — COMPREHENSIVE METABOLIC PANEL
ALT: 23 U/L (ref 0–44)
AST: 78 U/L — ABNORMAL HIGH (ref 15–41)
Albumin: 2.8 g/dL — ABNORMAL LOW (ref 3.5–5.0)
Alkaline Phosphatase: 88 U/L (ref 38–126)
Anion gap: 4 — ABNORMAL LOW (ref 5–15)
BUN: 12 mg/dL (ref 8–23)
CO2: 24 mmol/L (ref 22–32)
Calcium: 8.8 mg/dL — ABNORMAL LOW (ref 8.9–10.3)
Chloride: 111 mmol/L (ref 98–111)
Creatinine, Ser: 0.61 mg/dL (ref 0.61–1.24)
GFR, Estimated: >60 mL/min (ref >60)
Glucose, Bld: 97 mg/dL (ref 70–99)
Potassium: 3.7 mmol/L (ref 3.5–5.1)
Sodium: 139 mmol/L (ref 135–145)
Total Bilirubin: 4 mg/dL — ABNORMAL HIGH (ref 0.3–1.2)
Total Protein: 5.6 g/dL — ABNORMAL LOW (ref 6.5–8.1)

## 2020-09-08 LAB — CBC
HCT: 41.4 % (ref 39.0–52.0)
Hemoglobin: 14.2 g/dL (ref 13.0–17.0)
MCH: 36.3 pg — ABNORMAL HIGH (ref 26.0–34.0)
MCHC: 34.3 g/dL (ref 30.0–36.0)
MCV: 105.9 fL — ABNORMAL HIGH (ref 80.0–100.0)
Platelets: 44 10*3/uL — ABNORMAL LOW (ref 150–400)
RBC: 3.91 MIL/uL — ABNORMAL LOW (ref 4.22–5.81)
RDW: 14.4 % (ref 11.5–15.5)
WBC: 6.4 10*3/uL (ref 4.0–10.5)
nRBC: 0 % (ref 0.0–0.2)

## 2020-09-08 LAB — PROTIME-INR
INR: 1.6 — ABNORMAL HIGH (ref 0.8–1.2)
Prothrombin Time: 18.9 seconds — ABNORMAL HIGH (ref 11.4–15.2)

## 2020-09-08 LAB — CBG MONITORING, ED
Glucose-Capillary: 91 mg/dL (ref 70–99)
Glucose-Capillary: 92 mg/dL (ref 70–99)

## 2020-09-08 LAB — PHOSPHORUS: Phosphorus: 2.9 mg/dL (ref 2.5–4.6)

## 2020-09-08 LAB — APTT: aPTT: 34 seconds (ref 24–36)

## 2020-09-08 LAB — HIV ANTIBODY (ROUTINE TESTING W REFLEX): HIV Screen 4th Generation wRfx: NONREACTIVE

## 2020-09-08 LAB — MAGNESIUM: Magnesium: 1.8 mg/dL (ref 1.7–2.4)

## 2020-09-08 MED ORDER — DEXTROSE-NACL 5-0.45 % IV SOLN: INTRAVENOUS | Status: DC

## 2020-09-08 MED ORDER — LACTULOSE 10 GM/15ML PO SOLN
30.0000 g | Freq: Three times a day (TID) | ORAL | Status: DC
  Administered 2020-09-09 – 2020-09-11 (×7): 30 g via ORAL
  Filled 2020-09-08 (×8): qty 60

## 2020-09-08 NOTE — Progress Notes (Signed)
Patient Demographics:    [name] [name], is a 60 y.o. male, DOB - 1961-02-27, KPV:[id number]  Admit date - 09/07/2020   Admitting Physician Bernadette Hoit, DO  Outpatient Primary MD for the patient is Hermine Messick, MD  LOS - 1   Chief Complaint  Patient presents with  . Weakness        Subjective:    [name] [name] today has no fevers, no emesis,  No chest pain,   --lethargic, patient had BMs after rectal lactulose, -Able to take some oral intake at this time  Assessment  & Plan :    Principal Problem:   Hepatic encephalopathy (HCC) Active Problems:   COPD (chronic obstructive pulmonary disease) (HCC)   HTN (hypertension)   Cirrhosis (HCC)   GERD (gastroesophageal reflux disease)   Thrombocytopenia (HCC)   Hyperammonemia (HCC)  Brief Summary:- 60 y.o. male with medical history significant for liver cirrhosis, seizure disorder, thrombocytopenia, history of hepatitis, hepatic encephalopathy, type 2 diabetes mellitus, COPD, dementia, CVA, chronic systolic heart failure, hypertension urgency admitted on 09/07/2020 with altered mentation secondary to hepatic encephalopathy with rising ammonia levels  A/p 1) hepatic encephalopathy in the setting of liver cirrhosis improving after lactulose on the months okay to transition to p.o. lactulose/patient is now more awake to take oral intake -Continue rifaximin  2) chronic thrombocytopenia and hyperbilirubinemia in the setting of liver failure liver cirrhosis----continue to monitor -No bleeding concerns at this time  3) COPD--- no acute exacerbation, continue bronchodilators  4)HTN-continue metoprolol 100 mg twice daily  5)GERD-Protonix/ordered  Disposition/Need for in-Hospital Stay- patient unable to be discharged at this time due to --acute metabolic encephalopathy requiring lactulose enemas and IV fluids until oral intake is more  reliable  Status is: Inpatient  Remains inpatient appropriate because:Please see above   Disposition: The patient is from: Home              Anticipated d/c is to: Pending home health versus SNF              Anticipated d/c date is: 1 day              Patient currently is not medically stable to d/c. Barriers: Not Clinically Stable-   Code Status :  -  Code Status: DNR   Family Communication:   None present  Consults  :  na  DVT Prophylaxis  :   - SCDs SCDs Start: 09/07/20 2234    Lab Results  Component Value Date   PLT 44 (L) 09/08/2020    Inpatient Medications  Scheduled Meds: . lactulose  30 g Oral TID  . metoprolol tartrate  100 mg Oral BID  . pantoprazole  40 mg Oral Daily  . rifaximin  550 mg Oral BID  . umeclidinium-vilanterol  1 puff Inhalation Daily   Continuous Infusions: PRN Meds:.albuterol    Anti-infectives (From admission, onward)   Start     Dose/Rate Route Frequency Ordered Stop   09/08/20 1000  rifaximin (XIFAXAN) tablet 550 mg        550 mg Oral 2 times daily 09/07/20 2248          Objective:   Vitals:   09/08/20 1200 09/08/20 1700 09/08/20 1730 09/08/20 1800  BP: (!) 150/73 Marland Kitchen)  158/84 (!) 149/74 (!) 151/77  Pulse: 76 60 (!) 59 (!) 55  Resp: 18 17    Temp:      TempSrc:      SpO2: 99% 98% 98% 99%    Wt Readings from Last 3 Encounters:  04/29/20 111.1 kg  01/23/19 100.8 kg  04/24/18 96.6 kg    No intake or output data in the 24 hours ending 09/08/20 1923   Physical Exam  Gen:-Becoming more awake, in no acute distress  HEENT:- [place].AT,  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  Lungs-  CTAB , fair symmetrical air movement CV- S1, S2 normal, regular  Abd-  +ve B.Sounds, Abd Soft, No tenderness,    Extremity/Skin: 1-2+ pitting edema, pedal pulses present  Psych-becoming more awake and more coherent Neuro-generalized weakness no new focal deficits,   Data Review:   Micro Results Recent Results (from  the past 240 hour(s))  Resp Panel by RT-PCR (Flu A&B, Covid) Nasopharyngeal Swab     Status: None   Collection Time: 09/07/20 10:11 PM   Specimen: Nasopharyngeal Swab; Nasopharyngeal(NP) swabs in vial transport medium  Result Value Ref Range Status   SARS Coronavirus 2 by RT PCR NEGATIVE NEGATIVE Final    Comment: (NOTE) SARS-CoV-2 target nucleic acids are NOT DETECTED.  The SARS-CoV-2 RNA is generally detectable in upper respiratory specimens during the acute phase of infection. The lowest concentration of SARS-CoV-2 viral copies this assay can detect is 138 copies/mL. A negative result does not preclude SARS-Cov-2 infection and should not be used as the sole basis for treatment or other patient management decisions. A negative result may occur with  improper specimen collection/handling, submission of specimen other than nasopharyngeal swab, presence of viral mutation(s) within the areas targeted by this assay, and inadequate number of viral copies(<138 copies/mL). A negative result must be combined with clinical observations, patient history, and epidemiological information. The expected result is Negative.  Fact Sheet for Patients:  EntrepreneurPulse.com.au  Fact Sheet for Healthcare Providers:  IncredibleEmployment.be  This test is no t yet approved or cleared by the [place] FDA and  has been authorized for detection and/or diagnosis of SARS-CoV-2 by FDA under an Emergency Use Authorization (EUA). This EUA will remain  in effect (meaning this test can be used) for the duration of the COVID-19 declaration under Section 564(b)(1) of the Act, 21 U.S.C.section 360bbb-3(b)(1), unless the authorization is terminated  or revoked sooner.       Influenza A by PCR NEGATIVE NEGATIVE Final   Influenza B by PCR NEGATIVE NEGATIVE Final    Comment: (NOTE) The Xpert Xpress SARS-CoV-2/FLU/RSV plus assay is intended as an aid in the diagnosis of  influenza from Nasopharyngeal swab specimens and should not be used as a sole basis for treatment. Nasal washings and aspirates are unacceptable for Xpert Xpress SARS-CoV-2/FLU/RSV testing.  Fact Sheet for Patients: EntrepreneurPulse.com.au  Fact Sheet for Healthcare Providers: IncredibleEmployment.be  This test is not yet approved or cleared by the [place] FDA and has been authorized for detection and/or diagnosis of SARS-CoV-2 by FDA under an Emergency Use Authorization (EUA). This EUA will remain in effect (meaning this test can be used) for the duration of the COVID-19 declaration under Section 564(b)(1) of the Act, 21 U.S.C. section 360bbb-3(b)(1), unless the authorization is terminated or revoked.  Performed at [facility], [address], [place], [place] [address]     Radiology Reports US Venous Img Lower Bilateral (DVT)  Result Date: 09/08/2020 CLINICAL DATA:  Bilateral lower extremity pain and edema. Evaluate for DVT. EXAM: BILATERAL LOWER EXTREMITY VENOUS DOPPLER ULTRASOUND TECHNIQUE: Gray-scale sonography with graded compression, as well as color Doppler and duplex ultrasound were performed to evaluate the lower extremity deep venous systems from the level of the common femoral vein and including the common femoral, femoral, profunda femoral, popliteal and calf veins including the posterior tibial, peroneal and gastrocnemius veins when visible. The superficial great saphenous vein was also interrogated. Spectral Doppler was utilized to evaluate flow at rest and with distal augmentation maneuvers in the common femoral, femoral and popliteal veins. COMPARISON:  None. FINDINGS: RIGHT LOWER EXTREMITY Common Femoral Vein: No evidence of thrombus. Normal compressibility, respiratory phasicity and response to augmentation. Saphenofemoral Junction: No evidence of thrombus. Normal compressibility and flow on color Doppler imaging. Profunda Femoral  Vein: No evidence of thrombus. Normal compressibility and flow on color Doppler imaging. Femoral Vein: No evidence of thrombus. Normal compressibility, respiratory phasicity and response to augmentation. Popliteal Vein: No evidence of thrombus. Normal compressibility, respiratory phasicity and response to augmentation. Calf Veins: Appear patent where visualized. Superficial Great Saphenous Vein: No evidence of thrombus. Normal compressibility. Venous Reflux:  None. Other Findings:  None. LEFT LOWER EXTREMITY Common Femoral Vein: No evidence of thrombus. Normal compressibility, respiratory phasicity and response to augmentation. Saphenofemoral Junction: No evidence of thrombus. Normal compressibility and flow on color Doppler imaging. Profunda Femoral Vein: No evidence of thrombus. Normal compressibility and flow on color Doppler imaging. Femoral Vein: No evidence of thrombus. Normal compressibility, respiratory phasicity and response to augmentation. Popliteal Vein: No evidence of thrombus. Normal compressibility, respiratory phasicity and response to augmentation. Calf Veins: Not well visualized. Superficial Great Saphenous Vein: No evidence of thrombus. Normal compressibility. Venous Reflux:  None. Other Findings:  None. IMPRESSION: No evidence of DVT within either lower extremity. Electronically Signed   By: Sandi Mariscal M.D.   On: 09/08/2020 10:36     CBC Recent Labs  Lab 09/07/20 2110 09/08/20 0430  WBC 5.9 6.4  HGB 15.5 14.2  HCT 45.4 41.4  PLT 44* 44*  MCV 106.8* 105.9*  MCH 36.5* 36.3*  MCHC 34.1 34.3  RDW 14.5 14.4  LYMPHSABS 0.5*  --   MONOABS 0.3  --   EOSABS 0.0  --   BASOSABS 0.0  --     Chemistries  Recent Labs  Lab 09/07/20 2110 09/08/20 0430  NA 139 139  K 4.4 3.7  CL 109 111  CO2 19* 24  GLUCOSE 129* 97  BUN 12 12  CREATININE 0.72 0.61  CALCIUM 9.2 8.8*  MG  --  1.8  AST 66* 78*  ALT 26 23  ALKPHOS 108 88  BILITOT 4.2* 4.0*    ------------------------------------------------------------------------------------------------------------------ No results for input(s): CHOL, HDL, LDLCALC, TRIG, CHOLHDL, LDLDIRECT in the last 72 hours.  Lab Results  Component Value Date   HGBA1C 5.9 (H) 12/07/2015   ------------------------------------------------------------------------------------------------------------------ No results for input(s): TSH, T4TOTAL, T3FREE, THYROIDAB in the last 72 hours.  Invalid input(s): FREET3 ------------------------------------------------------------------------------------------------------------------ No results for input(s): VITAMINB12, FOLATE, FERRITIN, TIBC, IRON, RETICCTPCT in the last 72 hours.  Coagulation profile Recent Labs  Lab 09/08/20 0430  INR 1.6*    No results for input(s): DDIMER in the last 72 hours.  Cardiac Enzymes No results for input(s): CKMB, TROPONINI, MYOGLOBIN in the last 168 hours.  Invalid input(s): CK ------------------------------------------------------------------------------------------------------------------    Component Value Date/Time   BNP 142.0 (H) 04/24/2018 1835     Roxan Hockey M.D on 09/08/2020 at 7:23 PM  Go to [email or web address] - for contact info  [facility] - Office  [phone]

## 2020-09-08 NOTE — ED Notes (Signed)
Pt confuse. Currently retaining all of lactulose enemas

## 2020-09-08 NOTE — ED Notes (Signed)
Pt brief and linen changed

## 2020-09-08 NOTE — ED Notes (Signed)
Pt fed by staff. Pt is very hard of hearing . Blind in right eye. Hears better out of left ear. Pt talks continually.

## 2020-09-08 NOTE — ED Notes (Signed)
Wife updated

## 2020-09-09 ENCOUNTER — Encounter (HOSPITAL_COMMUNITY): Payer: Self-pay | Admitting: Internal Medicine

## 2020-09-09 ENCOUNTER — Other Ambulatory Visit: Payer: Self-pay

## 2020-09-09 LAB — COMPREHENSIVE METABOLIC PANEL
ALT: 25 U/L (ref 0–44)
AST: 102 U/L — ABNORMAL HIGH (ref 15–41)
Albumin: 2.6 g/dL — ABNORMAL LOW (ref 3.5–5.0)
Alkaline Phosphatase: 78 U/L (ref 38–126)
Anion gap: 5 (ref 5–15)
BUN: 13 mg/dL (ref 8–23)
CO2: 23 mmol/L (ref 22–32)
Calcium: 8.3 mg/dL — ABNORMAL LOW (ref 8.9–10.3)
Chloride: 110 mmol/L (ref 98–111)
Creatinine, Ser: 0.65 mg/dL (ref 0.61–1.24)
GFR, Estimated: >60 mL/min (ref >60)
Glucose, Bld: 107 mg/dL — ABNORMAL HIGH (ref 70–99)
Potassium: 3.6 mmol/L (ref 3.5–5.1)
Sodium: 138 mmol/L (ref 135–145)
Total Bilirubin: 3.8 mg/dL — ABNORMAL HIGH (ref 0.3–1.2)
Total Protein: 5.2 g/dL — ABNORMAL LOW (ref 6.5–8.1)

## 2020-09-09 LAB — AMMONIA: Ammonia: 79 umol/L — ABNORMAL HIGH (ref 9–35)

## 2020-09-09 LAB — GLUCOSE, CAPILLARY
Glucose-Capillary: 98 mg/dL (ref 70–99)
Glucose-Capillary: 103 mg/dL — ABNORMAL HIGH (ref 70–99)
Glucose-Capillary: 166 mg/dL — ABNORMAL HIGH (ref 70–99)
Glucose-Capillary: 114 mg/dL — ABNORMAL HIGH (ref 70–99)

## 2020-09-09 LAB — URINE CULTURE: Culture: NO GROWTH

## 2020-09-09 LAB — CBG MONITORING, ED: Glucose-Capillary: 72 mg/dL (ref 70–99)

## 2020-09-09 MED ORDER — ENSURE ENLIVE PO LIQD
237.0000 mL | Freq: Two times a day (BID) | ORAL | Status: DC
  Administered 2020-09-09 – 2020-09-11 (×4): 237 mL via ORAL

## 2020-09-09 NOTE — Progress Notes (Signed)
Patient Demographics:    [name] [name], is a 60 y.o. male, DOB - [birth year], FWY:[id number]  Admit date - 09/07/2020   Admitting Physician Bernadette Hoit, DO  Outpatient Primary MD for the patient is Hermine Messick, MD  LOS - 2   Chief Complaint  Patient presents with  . Weakness        Subjective:    [name] [name] today has no fevers, no emesis,  No chest pain,   -More awake, having BMs, able to take some oral intake but needs help with feeding  Assessment  & Plan :    Principal Problem:   Hepatic encephalopathy (HCC) Active Problems:   COPD (chronic obstructive pulmonary disease) (HCC)   HTN (hypertension)   Cirrhosis (HCC)   GERD (gastroesophageal reflux disease)   Thrombocytopenia (HCC)   Hyperammonemia (HCC)  Brief Summary:- 60 y.o. male with medical history significant for liver cirrhosis, seizure disorder, thrombocytopenia, history of hepatitis, hepatic encephalopathy, type 2 diabetes mellitus, COPD, dementia, CVA, chronic systolic heart failure, hypertension urgency admitted on 09/07/2020 with altered mentation secondary to hepatic encephalopathy with rising ammonia levels  A/p 1) hepatic encephalopathy in the setting of liver cirrhosis improving after lactulose enemas --transitioned to p.o. lactulose --patient is now more awake to take oral intake -Continue rifaximin  2) chronic thrombocytopenia and hyperbilirubinemia in the setting of liver failure liver cirrhosis----continue to monitor -No bleeding concerns at this time  3) COPD--- no acute exacerbation, continue bronchodilators  4)HTN-continue metoprolol 100 mg twice daily  5)GERD-Protonix/ordered  6)Social/Ethics--tried reaching family at [phone]--- a gentleman answered and told me that this was a wrong number -Tried calling spouse at [phone] but no answer -- Patient remains a DNR/DNI  Disposition/Need for  in-Hospital Stay- patient unable to be discharged at this time due to --acute metabolic encephalopathy requiring lactulose enemas and IV fluids until oral intake is more reliable  Status is: Inpatient  Remains inpatient appropriate because:Please see above   Disposition: The patient is from: Home              Anticipated d/c is to: Pending home health versus SNF              Anticipated d/c date is: 1 day              Patient currently is not medically stable to d/c. Barriers: Not Clinically Stable-   Code Status :  -  Code Status: DNR   Family Communication:   None present -tried reaching family at [phone]--- a gentleman answered and told me that this was a wrong number -Tried calling spouse at [phone] but no answer  Consults  :  na  DVT Prophylaxis  :   - SCDs SCDs Start: 09/07/20 2234    Lab Results  Component Value Date   PLT 44 (L) 09/08/2020    Inpatient Medications  Scheduled Meds: . feeding supplement  237 mL Oral BID BM  . lactulose  30 g Oral TID  . metoprolol tartrate  100 mg Oral BID  . pantoprazole  40 mg Oral Daily  . rifaximin  550 mg Oral BID  . umeclidinium-vilanterol  1 puff Inhalation Daily   Continuous Infusions: . dextrose 5 % and 0.45% NaCl 50 mL/hr  at 09/08/20 2147   PRN Meds:.albuterol    Anti-infectives (From admission, onward)   Start     Dose/Rate Route Frequency Ordered Stop   09/08/20 1000  rifaximin (XIFAXAN) tablet 550 mg        550 mg Oral 2 times daily 09/07/20 2248          Objective:   Vitals:   09/09/20 0807 09/09/20 1056 09/09/20 1429 09/09/20 1431  BP:  127/71 119/68 115/73  Pulse:  (!) 57 (!) 52 (!) 53  Resp:  16 18 18   Temp:  98.6 F (37 C) 98.5 F (36.9 C) 98.5 F (36.9 C)  TempSrc:  Oral Oral Oral  SpO2: 98% 96%  98%  Weight:      Height:        Wt Readings from Last 3 Encounters:  09/09/20 83.3 kg  04/29/20 111.1 kg  01/23/19 100.8 kg     Intake/Output Summary (Last 24 hours) at  09/09/2020 1657 Last data filed at 09/09/2020 1500 Gross per 24 hour  Intake 890.83 ml  Output 300 ml  Net 590.83 ml     Physical Exam  Gen:-Awake and able to talk HEENT:- [place].AT,  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  Lungs-  CTAB , fair symmetrical air movement CV- S1, S2 normal, regular  Abd-  +ve B.Sounds, Abd Soft, No tenderness,    Extremity/Skin: 1-2+ pitting edema, pedal pulses present  Psych-oriented x2 , affect is flat neuro-generalized weakness no new focal deficits,   Data Review:   Micro Results Recent Results (from the past 240 hour(s))  Urine Culture     Status: None   Collection Time: 09/07/20  9:00 PM   Specimen: Urine, Clean Catch  Result Value Ref Range Status   Specimen Description   Final    URINE, CLEAN CATCH Performed at [facility], [address], [place], [place] [address]    Special Requests   Final    NONE Performed at [facility], [address], [place], [place] [address]    Culture   Final    NO GROWTH Performed at [facility], [place] [address], [place], [place] [address]    Report Status 09/09/2020 FINAL  Final  Resp Panel by RT-PCR (Flu A&B, Covid) Nasopharyngeal Swab     Status: None   Collection Time: 09/07/20 10:11 PM   Specimen: Nasopharyngeal Swab; Nasopharyngeal(NP) swabs in vial transport medium  Result Value Ref Range Status   SARS Coronavirus 2 by RT PCR NEGATIVE NEGATIVE Final    Comment: (NOTE) SARS-CoV-2 target nucleic acids are NOT DETECTED.  The SARS-CoV-2 RNA is generally detectable in upper respiratory specimens during the acute phase of infection. The lowest concentration of SARS-CoV-2 viral copies this assay can detect is 138 copies/mL. A negative result does not preclude SARS-Cov-2 infection and should not be used as the sole basis for treatment or other patient management decisions. A negative result may occur with  improper specimen collection/handling, submission of specimen  other than nasopharyngeal swab, presence of viral mutation(s) within the areas targeted by this assay, and inadequate number of viral copies(<138 copies/mL). A negative result must be combined with clinical observations, patient history, and epidemiological information. The expected result is Negative.  Fact Sheet for Patients:  EntrepreneurPulse.com.au  Fact Sheet for Healthcare Providers:  IncredibleEmployment.be  This test is no t yet approved or cleared by the [place] FDA and  has been authorized for detection and/or diagnosis of SARS-CoV-2 by FDA  under an Emergency Use Authorization (EUA). This EUA will remain  in effect (meaning this test can be used) for the duration of the COVID-19 declaration under Section 564(b)(1) of the Act, 21 U.S.C.section 360bbb-3(b)(1), unless the authorization is terminated  or revoked sooner.       Influenza A by PCR NEGATIVE NEGATIVE Final   Influenza B by PCR NEGATIVE NEGATIVE Final    Comment: (NOTE) The Xpert Xpress SARS-CoV-2/FLU/RSV plus assay is intended as an aid in the diagnosis of influenza from Nasopharyngeal swab specimens and should not be used as a sole basis for treatment. Nasal washings and aspirates are unacceptable for Xpert Xpress SARS-CoV-2/FLU/RSV testing.  Fact Sheet for Patients: EntrepreneurPulse.com.au  Fact Sheet for Healthcare Providers: IncredibleEmployment.be  This test is not yet approved or cleared by the [place] FDA and has been authorized for detection and/or diagnosis of SARS-CoV-2 by FDA under an Emergency Use Authorization (EUA). This EUA will remain in effect (meaning this test can be used) for the duration of the COVID-19 declaration under Section 564(b)(1) of the Act, 21 U.S.C. section 360bbb-3(b)(1), unless the authorization is terminated or revoked.  Performed at [facility], [address], [place], [place]  [address]     Radiology Reports US Venous Img Lower Bilateral (DVT)  Result Date: 09/08/2020 CLINICAL DATA:  Bilateral lower extremity pain and edema. Evaluate for DVT. EXAM: BILATERAL LOWER EXTREMITY VENOUS DOPPLER ULTRASOUND TECHNIQUE: Gray-scale sonography with graded compression, as well as color Doppler and duplex ultrasound were performed to evaluate the lower extremity deep venous systems from the level of the common femoral vein and including the common femoral, femoral, profunda femoral, popliteal and calf veins including the posterior tibial, peroneal and gastrocnemius veins when visible. The superficial great saphenous vein was also interrogated. Spectral Doppler was utilized to evaluate flow at rest and with distal augmentation maneuvers in the common femoral, femoral and popliteal veins. COMPARISON:  None. FINDINGS: RIGHT LOWER EXTREMITY Common Femoral Vein: No evidence of thrombus. Normal compressibility, respiratory phasicity and response to augmentation. Saphenofemoral Junction: No evidence of thrombus. Normal compressibility and flow on color Doppler imaging. Profunda Femoral Vein: No evidence of thrombus. Normal compressibility and flow on color Doppler imaging. Femoral Vein: No evidence of thrombus. Normal compressibility, respiratory phasicity and response to augmentation. Popliteal Vein: No evidence of thrombus. Normal compressibility, respiratory phasicity and response to augmentation. Calf Veins: Appear patent where visualized. Superficial Great Saphenous Vein: No evidence of thrombus. Normal compressibility. Venous Reflux:  None. Other Findings:  None. LEFT LOWER EXTREMITY Common Femoral Vein: No evidence of thrombus. Normal compressibility, respiratory phasicity and response to augmentation. Saphenofemoral Junction: No evidence of thrombus. Normal compressibility and flow on color Doppler imaging. Profunda Femoral Vein: No evidence of thrombus. Normal compressibility and flow on color  Doppler imaging. Femoral Vein: No evidence of thrombus. Normal compressibility, respiratory phasicity and response to augmentation. Popliteal Vein: No evidence of thrombus. Normal compressibility, respiratory phasicity and response to augmentation. Calf Veins: Not well visualized. Superficial Great Saphenous Vein: No evidence of thrombus. Normal compressibility. Venous Reflux:  None. Other Findings:  None. IMPRESSION: No evidence of DVT within either lower extremity. Electronically Signed   By: Sandi Mariscal M.D.   On: 09/08/2020 10:36     CBC Recent Labs  Lab 09/07/20 2110 09/08/20 0430  WBC 5.9 6.4  HGB 15.5 14.2  HCT 45.4 41.4  PLT 44* 44*  MCV 106.8* 105.9*  MCH 36.5* 36.3*  MCHC 34.1 34.3  RDW 14.5 14.4  LYMPHSABS 0.5*  --  MONOABS 0.3  --   EOSABS 0.0  --   BASOSABS 0.0  --     Chemistries  Recent Labs  Lab 09/07/20 2110 09/08/20 0430 09/09/20 0643  NA 139 139 138  K 4.4 3.7 3.6  CL 109 111 110  CO2 19* 24 23  GLUCOSE 129* 97 107*  BUN 12 12 13   CREATININE 0.72 0.61 0.65  CALCIUM 9.2 8.8* 8.3*  MG  --  1.8  --   AST 66* 78* 102*  ALT 26 23 25   ALKPHOS 108 88 78  BILITOT 4.2* 4.0* 3.8*   ------------------------------------------------------------------------------------------------------------------ No results for input(s): CHOL, HDL, LDLCALC, TRIG, CHOLHDL, LDLDIRECT in the last 72 hours.  Lab Results  Component Value Date   HGBA1C 5.9 (H) 12/07/2015   ------------------------------------------------------------------------------------------------------------------ No results for input(s): TSH, T4TOTAL, T3FREE, THYROIDAB in the last 72 hours.  Invalid input(s): FREET3 ------------------------------------------------------------------------------------------------------------------ No results for input(s): VITAMINB12, FOLATE, FERRITIN, TIBC, IRON, RETICCTPCT in the last 72 hours.  Coagulation profile Recent Labs  Lab 09/08/20 0430  INR 1.6*    No  results for input(s): DDIMER in the last 72 hours.  Cardiac Enzymes No results for input(s): CKMB, TROPONINI, MYOGLOBIN in the last 168 hours.  Invalid input(s): CK ------------------------------------------------------------------------------------------------------------------    Component Value Date/Time   BNP 142.0 (H) 04/24/2018 1835     Roxan Hockey M.D on 09/09/2020 at 4:57 PM  Go to [email or web address] - for contact info  [facility] - Office  [phone]

## 2020-09-09 NOTE — ED Notes (Signed)
Report given to [facility]

## 2020-09-09 NOTE — Plan of Care (Signed)

## 2020-09-10 ENCOUNTER — Other Ambulatory Visit: Payer: Self-pay

## 2020-09-10 LAB — GLUCOSE, CAPILLARY
Glucose-Capillary: 103 mg/dL — ABNORMAL HIGH (ref 70–99)
Glucose-Capillary: 105 mg/dL — ABNORMAL HIGH (ref 70–99)
Glucose-Capillary: 108 mg/dL — ABNORMAL HIGH (ref 70–99)
Glucose-Capillary: 108 mg/dL — ABNORMAL HIGH (ref 70–99)
Glucose-Capillary: 122 mg/dL — ABNORMAL HIGH (ref 70–99)
Glucose-Capillary: 94 mg/dL (ref 70–99)

## 2020-09-10 LAB — COMPREHENSIVE METABOLIC PANEL
ALT: 27 U/L (ref 0–44)
AST: 96 U/L — ABNORMAL HIGH (ref 15–41)
Albumin: 2.5 g/dL — ABNORMAL LOW (ref 3.5–5.0)
Alkaline Phosphatase: 77 U/L (ref 38–126)
Anion gap: 6 (ref 5–15)
BUN: 10 mg/dL (ref 8–23)
CO2: 23 mmol/L (ref 22–32)
Calcium: 8.3 mg/dL — ABNORMAL LOW (ref 8.9–10.3)
Chloride: 109 mmol/L (ref 98–111)
Creatinine, Ser: 0.65 mg/dL (ref 0.61–1.24)
GFR, Estimated: >60 mL/min (ref >60)
Glucose, Bld: 101 mg/dL — ABNORMAL HIGH (ref 70–99)
Potassium: 3.4 mmol/L — ABNORMAL LOW (ref 3.5–5.1)
Sodium: 138 mmol/L (ref 135–145)
Total Bilirubin: 3.5 mg/dL — ABNORMAL HIGH (ref 0.3–1.2)
Total Protein: 5.1 g/dL — ABNORMAL LOW (ref 6.5–8.1)

## 2020-09-10 LAB — CBC
HCT: 38.6 % — ABNORMAL LOW (ref 39.0–52.0)
Hemoglobin: 13 g/dL (ref 13.0–17.0)
MCH: 36.1 pg — ABNORMAL HIGH (ref 26.0–34.0)
MCHC: 33.7 g/dL (ref 30.0–36.0)
MCV: 107.2 fL — ABNORMAL HIGH (ref 80.0–100.0)
Platelets: 41 10*3/uL — ABNORMAL LOW (ref 150–400)
RBC: 3.6 MIL/uL — ABNORMAL LOW (ref 4.22–5.81)
RDW: 14.3 % (ref 11.5–15.5)
WBC: 4 10*3/uL (ref 4.0–10.5)
nRBC: 0 % (ref 0.0–0.2)

## 2020-09-10 MED ORDER — POTASSIUM CHLORIDE CRYS ER 20 MEQ PO TBCR
40.0000 meq | EXTENDED_RELEASE_TABLET | ORAL | Status: AC
  Administered 2020-09-10 (×2): 40 meq via ORAL
  Filled 2020-09-10 (×2): qty 2

## 2020-09-10 NOTE — Plan of Care (Signed)

## 2020-09-10 NOTE — Plan of Care (Signed)
  Problem: Acute Rehab PT Goals(only PT should resolve) Goal: Pt Will Go Supine/Side To Sit Outcome: Progressing Flowsheets (Taken 09/10/2020 1405) Pt will go Supine/Side to Sit: with modified independence Goal: Pt Will Go Sit To Supine/Side Outcome: Progressing Flowsheets (Taken 09/10/2020 1405) Pt will go Sit to Supine/Side: with modified independence Goal: Patient Will Transfer Sit To/From Stand Outcome: Progressing Flowsheets (Taken 09/10/2020 1405) Patient will transfer sit to/from stand: with supervision Goal: Pt Will Transfer Bed To Chair/Chair To Bed Outcome: Progressing Flowsheets (Taken 09/10/2020 1405) Pt will Transfer Bed to Chair/Chair to Bed: with supervision Goal: Pt Will Ambulate Outcome: Progressing Flowsheets (Taken 09/10/2020 1405) Pt will Ambulate:  > 125 feet  with minimal assist  with rolling walker   Tori Richanda Darin PT, DPT 09/10/20, 2:06 PM [phone]

## 2020-09-10 NOTE — TOC Initial Note (Signed)
Transition of Care [facility]) - Initial/Assessment Note   Patient Details  Name: [name] [name] MRN: [id number] Date of Birth: 06/03/1961  Transition of Care [facility]) CM/SW Contact:    Sherie Don, LCSW Phone Number: 09/10/2020, 2:33 PM  Clinical Narrative: Patient is a 60 year old male who was admitted for hepatic encephalopathy. Patient has a history of COPD, HTN, cirrhosis, GERD, thrombocytopenia, and hyperammonemia. Readmission checklist completed due to high readmission score. PT evaluation recommended SNF. CSW spoke with patient's wife, [name] [name], to complete assessment due to patient's orientation. Per wife, patient resides at home with her. At baseline, patient requires extensive assistance with ADLs. Wife reported she also has to assist patient with medications to ensure he takes them correctly. Wife is currently providing transport to appointments. CSW discussed PT recommendations with wife. Wife agreeable to SNF and stated she was trying to get the patient into [facility] for [place] and requested that a referral be sent to [facility] as well as other SNFs in [place]. Wife requested the referral not be sent to [facility].  FL2 completed and faxed out in hub. PASRR pending.  CSW received call from [name] with [facility] APS. Per Ms. William Hamburger, she is working with the wife and patient to get him to [facility] and would prefer patient be referred there if there are SNF beds available so he can be transitioned to LTC after rehab. APS is considering guardianship for the patient. TOC awaiting bed offers.  Expected Discharge Plan: Skilled Nursing Facility Barriers to Discharge: Continued Medical Work up  Patient Goals and CMS Choice Patient states their goals for this hospitalization and ongoing recovery are:: Go to SNF CMS Medicare.gov Compare Post Acute Care list provided to:: Patient Represenative (must comment) [name] (wife)) Choice offered to / list  presented to : Spouse  Expected Discharge Plan and Services Expected Discharge Plan: [place] In-house Referral: Clinical Social Work Discharge Planning Services: NA Post Acute Care Choice: [place] Living arrangements for the past 2 months: Single Family Home             DME Arranged: N/A DME Agency: NA HH Arranged: NA [place] Agency: NA  Prior Living Arrangements/Services Living arrangements for the past 2 months: [place] Lives with:: Spouse Patient language and need for interpreter reviewed:: Yes Do you feel safe going back to the place where you live?: Yes      Need for Family Participation in Patient Care: Yes (Comment) (Patient oriented x2) Care giver support system in place?: Yes (comment) Current home services: DME Criminal Activity/Legal Involvement Pertinent to Current Situation/Hospitalization: No - Comment as needed  Activities of Daily Living Home Assistive Devices/Equipment: Gilford Rile (specify type) ADL Screening (condition at time of admission) Patient's cognitive ability adequate to safely complete daily activities?: No Is the patient deaf or have difficulty hearing?: Yes Does the patient have difficulty seeing, even when wearing glasses/contacts?: Yes Does the patient have difficulty concentrating, remembering, or making decisions?: Yes Patient able to express need for assistance with ADLs?: No Does the patient have difficulty dressing or bathing?: Yes Independently performs ADLs?: No Communication: Independent Dressing (OT): Needs assistance Is this a change from baseline?: Pre-admission baseline Grooming: Needs assistance Is this a change from baseline?: Pre-admission baseline Feeding: Needs assistance Is this a change from baseline?: Pre-admission baseline Bathing: Needs assistance Is this a change from baseline?: Pre-admission baseline Toileting: Needs assistance Is this a change from baseline?: Pre-admission  baseline In/Out  Bed: Needs assistance Is this a change from baseline?: Pre-admission baseline Walks in Home: Needs assistance Is this a change from baseline?: Pre-admission baseline Does the patient have difficulty walking or climbing stairs?: Yes Weakness of Legs: Both Weakness of Arms/Hands: Both  Permission Sought/Granted Permission sought to share information with : Facility Art therapist granted to share information with : Yes, Verbal Permission Granted Permission granted to share info w AGENCY: SNFs  Emotional Assessment Attitude/Demeanor/Rapport: Unable to Assess Affect (typically observed): Unable to Assess Orientation: : Oriented to Self,Oriented to Place Alcohol / Substance Use: Tobacco Use Psych Involvement: No (comment)  Admission diagnosis:  Hepatic encephalopathy (HCC) [K72.90] Acute encephalopathy [G93.40] Right leg swelling [M79.89] Cirrhosis of liver without ascites, unspecified hepatic cirrhosis type ([place]) [K74.60] Patient Active Problem List   Diagnosis Date Noted  . Atrial fibrillation ([place]) 01/23/2019  . Atrial fibrillation with RVR ([place]) 01/22/2019  . Chronic pain syndrome   . Cellulitis of right lower extremity 02/17/2018  . Dysphagia 10/10/2017  . Encounter for screening colonoscopy 10/10/2017  . Palliative care encounter   . Goals of care, counseling/discussion   . DNR (do not resuscitate) discussion   . Encounter for hospice care discussion   . Altered mental status 09/11/2017  . Acute encephalopathy 12/29/2016  . Subarachnoid hemorrhage ([place]) 12/29/2016  . Bacteremia 12/29/2016  . Fall 12/21/2016  . Cerebral contusion ([place]) 12/21/2016  . Pressure injury of skin 12/14/2016  . Anxiety   . Suicidal ideation   . Acute hepatic encephalopathy ([place]) 08/02/2013  . Hypokalemia 02/01/2013  . Hyperammonemia ([place]) 01/31/2013  . Urinary incontinence 01/31/2013  . Pancytopenia ([place]) 09/17/2012  . Edema of right lower extremity  04/28/2012  . Hepatic encephalopathy ([place]) 04/28/2012  . Arthritis of ankle or foot, degenerative 04/26/2012  . Hematemesis 12/07/2011  . Thrombocytopenia ([place]) 12/07/2011  . Anemia 12/07/2011  . COPD (chronic obstructive pulmonary disease) ([place]) 08/25/2011  . DM (diabetes mellitus) ([place]) 08/25/2011  . HTN (hypertension) 08/25/2011  . Coagulopathy ([place]) 08/25/2011  . Cirrhosis ([place]) 08/25/2011  . Depression 08/25/2011  . Seizure disorder ([place]) 08/25/2011  . GERD (gastroesophageal reflux disease) 08/25/2011  . Tobacco abuse 08/25/2011  . Ankle fracture, bimalleolar, closed 08/04/2011   PCP:  Hermine Messick, MD Pharmacy:   [facility], [place] [address] Phone: [phone] Fax: [phone]  Readmission Risk Interventions Readmission Risk Prevention Plan 09/10/2020 01/23/2019 01/22/2019  Transportation Screening Complete - Complete  PCP or Specialist Appt within 3-5 Days Not Complete Complete -  Not Complete comments Patient being referred out for SNF - -  [place] or Home Care Consult Complete Complete -  Social Work Consult for [place] Planning/Counseling Complete Complete -  Palliative Care Screening Not Applicable - Not Applicable  Medication Review Press photographer) Complete Complete -  Some recent data might be hidden

## 2020-09-10 NOTE — Progress Notes (Signed)
Transition of Care (TOC) -30 day Note       Patient Details  Name: [name] [name] MRN: [id number] Date of Birth: 1960/08/24   Transition of Care [facility]) CM/SW Contact  Name: Shade Flood Phone Number: [phone] Date: 09/10/2020 Time: 1508   MUST ID: [id number]   To Whom it May Concern:   Please be advised that the above patient will require a short-term nursing home stay, anticipated 30 days or less rehabilitation and strengthening. The plan is for return home.

## 2020-09-10 NOTE — Progress Notes (Signed)
Initial Nutrition Assessment  DOCUMENTATION CODES:   Not applicable  INTERVENTION:  Downgrade diet to DYS 3 (chopped meats) for ease of intake  Magic cup BID with meals, each supplement provides 290 kcal and 9 grams of protein  NUTRITION DIAGNOSIS:  Biting/chewing difficulty related to social / environmental circumstances as evidenced by  (edentulous status).   GOAL:   Patient will meet greater than or equal to 90% of their needs   MONITOR:   PO intake,Weight trends,I & O's,Skin,Labs  REASON FOR ASSESSMENT:   Malnutrition Screening Tool    ASSESSMENT:  60 year old male with history of liver cirrhosis, seizure disorder, thrombocytopenia, hepatitis, hepatic encephalopathy, DM2, COPD, dementia, CVA, chronic sCHF, HTN, presented with AMS.  Pt admitted for hepatic encephalopathy  Pt sleeping soundly this morning, did not wake after multiple name calls. Nurse entered room, reports pt recently fell asleep, has dementia thinks he is at home, and has not rested well. He requires assistance with feeding, RN reports pt is eating well, consumed ~95% of breakfast this morning (egg and cheese sandwich, potatoes, bacon, orange juice) Pt edentulous, nurse denied chewing/swallowing difficulties with feeding breakfast tray. Given dementia as well as poor dentition, will downgrade diet to DYS 3 (chopped meats) for ease of intake.   Per chart weights have trended down 61 lbs (25%) in the last 4 months; significant. Unsure of accuracy of weight history as pt appeared well nourished without signs of significant fat/muscle wasting on exam. NFPE was not complete given pt not awake at this time, will plan to perform complete exam at follow-up.   Meal completion 95-100% x 2 documented intakes  Medications reviewed and include: Lactulose, Protonix, Klor-con, Rifaximin  Labs: CBGs 105,108,114,166, K 3.4 (L) A1c 5.9 in 2017  NUTRITION - FOCUSED PHYSICAL EXAM:  Flowsheet Row Most Recent Value   Orbital Region Mild depletion  Upper Arm Region Unable to assess  Thoracic and Lumbar Region Unable to assess  Buccal Region Unable to assess  [edentulous]  Clavicle Bone Region No depletion  Clavicle and Acromion Bone Region No depletion  Scapular Bone Region Unable to assess  Dorsal Hand No depletion  Patellar Region Unable to assess  Anterior Thigh Region Unable to assess  Posterior Calf Region Unable to assess  Edema (RD Assessment) None  Hair Reviewed  Eyes Unable to assess  Mouth Unable to assess  Skin Reviewed  Nails Reviewed       Diet Order:   Diet Order            DIET DYS 3 Room service appropriate? Yes; Fluid consistency: Thin  Diet effective now                 EDUCATION NEEDS:   No education needs have been identified at this time  Skin:  Skin Assessment: Reviewed RN Assessment  Last BM:  1/17 type 7 (large;brown)  Height:   Ht Readings from Last 1 Encounters:  09/09/20 5' 7"  (1.702 m)    Weight:   Wt Readings from Last 1 Encounters:  09/09/20 83.3 kg    BMI:  Body mass index is 28.76 kg/m.  Estimated Nutritional Needs:   Kcal:  2100-2300  Protein:  105-120  Fluid:  >/= 2.1 L/day    Lajuan Lines, RD, LDN Clinical Nutrition After Hours/Weekend Pager # in [place]

## 2020-09-10 NOTE — NC FL2 (Signed)
[place] LEVEL OF CARE SCREENING TOOL     IDENTIFICATION  Patient Name: [name] [name] Birthdate: 1960/10/22 Sex: male Admission Date (Current Location): 09/07/2020  [facility] and [place] Number:  [employer] and Address:  [place] [address], [place]      Provider Number: [id number]  Attending Physician Name and Address:  Roxan Hockey, MD  Relative Name and Phone Number:  Travaughn Vue (wife) Ph: [phone]    Current Level of Care: Hospital Recommended Level of Care: [place] Prior Approval Number:    Date Approved/Denied:   PASRR Number:    Discharge Plan: SNF    Current Diagnoses: Patient Active Problem List   Diagnosis Date Noted  . Atrial fibrillation ([place]) 01/23/2019  . Atrial fibrillation with RVR ([place]) 01/22/2019  . Chronic pain syndrome   . Cellulitis of right lower extremity 02/17/2018  . Dysphagia 10/10/2017  . Encounter for screening colonoscopy 10/10/2017  . Palliative care encounter   . Goals of care, counseling/discussion   . DNR (do not resuscitate) discussion   . Encounter for hospice care discussion   . Altered mental status 09/11/2017  . Acute encephalopathy 12/29/2016  . Subarachnoid hemorrhage ([place]) 12/29/2016  . Bacteremia 12/29/2016  . Fall 12/21/2016  . Cerebral contusion ([place]) 12/21/2016  . Pressure injury of skin 12/14/2016  . Anxiety   . Suicidal ideation   . Acute hepatic encephalopathy ([place]) 08/02/2013  . Hypokalemia 02/01/2013  . Hyperammonemia ([place]) 01/31/2013  . Urinary incontinence 01/31/2013  . Pancytopenia ([place]) 09/17/2012  . Edema of right lower extremity 04/28/2012  . Hepatic encephalopathy ([place]) 04/28/2012  . Arthritis of ankle or foot, degenerative 04/26/2012  . Hematemesis 12/07/2011  . Thrombocytopenia ([place]) 12/07/2011  . Anemia 12/07/2011  . COPD (chronic obstructive pulmonary disease) ([place]) 08/25/2011  . DM (diabetes mellitus) ([place])  08/25/2011  . HTN (hypertension) 08/25/2011  . Coagulopathy ([place]) 08/25/2011  . Cirrhosis ([place]) 08/25/2011  . Depression 08/25/2011  . Seizure disorder ([place]) 08/25/2011  . GERD (gastroesophageal reflux disease) 08/25/2011  . Tobacco abuse 08/25/2011  . Ankle fracture, bimalleolar, closed 08/04/2011    Orientation RESPIRATION BLADDER Height & Weight     Self,Place  Normal Incontinent Weight: 183 lb 10.3 oz (83.3 kg) Height:  5' 7"  (170.2 cm)  BEHAVIORAL SYMPTOMS/MOOD NEUROLOGICAL BOWEL NUTRITION STATUS      Continent Diet (Dysphagia diet)  AMBULATORY STATUS COMMUNICATION OF NEEDS Skin   Extensive Assist Verbally Other (Comment) (BLE (scabs, discoloration))                       Personal Care Assistance Level of Assistance  Bathing,Feeding,Dressing Bathing Assistance: Limited assistance Feeding assistance: Independent Dressing Assistance: Limited assistance     Functional Limitations Info  Sight,Hearing,Speech Sight Info: 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 albuterol (VENTOLIN HFA) 108 (90 Base) MCG/ACT inhaler 2 puff  2 puff Inhalation Q4H PRN Adefeso,  [place]apo, DO      . dextrose 5 %-0.45 % sodium chloride infusion   Intravenous Continuous Roxan Hockey, MD 50 mL/hr at 09/08/20 2147 New Bag at 09/08/20 2147  . feeding supplement (ENSURE ENLIVE / ENSURE PLUS) liquid 237 mL  237 mL Oral BID BM Adefeso, Oladapo, DO   237 mL at 09/10/20 0918  . lactulose (CHRONULAC) 10 GM/15ML  solution 30 g  30 g Oral TID Roxan Hockey, MD   30 g at 09/10/20 0918  . metoprolol tartrate (LOPRESSOR) tablet 100 mg  100 mg Oral BID Adefeso, Oladapo, DO   100 mg at 09/10/20 0917  . pantoprazole (PROTONIX) EC tablet 40 mg  40 mg Oral Daily Adefeso, Oladapo, DO   40 mg at 09/10/20 0917  . rifaximin (XIFAXAN) tablet 550 mg  550 mg Oral BID Adefeso, Oladapo, DO   550 mg at 09/10/20 0918  . umeclidinium-vilanterol (ANORO ELLIPTA) 62.5-25 MCG/INH 1 puff  1 puff Inhalation Daily Adefeso, Oladapo, DO   1 puff at 09/09/20 0807     Discharge Medications: Please see discharge summary for a list of discharge medications.  Relevant Imaging Results:  Relevant Lab Results:   Additional Information SSN: [id number]  Sherie Don, LCSW

## 2020-09-10 NOTE — Evaluation (Signed)
Physical Therapy Evaluation Patient Details Name: [name] [name] MRN: [id number] DOB: 12/07/1960 Today's Date: 09/10/2020   History of Present Illness  60 y.o. male with medical history of liver cirrhosis, seizure disorder, thrombocytopenia, history of hepatitis, hepatic encephalopathy, type 2 diabetes, COPD, dementia, CVA, CHF, HTN presents with altered mentation  Clinical Impression  Pt admitted with above diagnosis. Baseline difficult to obtain due to pt cognition, reports living with spouse at home with steps to enter but unsure how many, reports ability to walk without any equipment and unable to recall if having DME at home. Pt requiring min A with limited steps in room and limited due to fatigue. Recommending SNF due to assist level and weakness, may be able to return home with progress and if family is able to assist. Pt currently with functional limitations due to the deficits listed below (see PT Problem List). Pt will benefit from skilled PT to increase their independence and safety with mobility to allow discharge to the venue listed below.       Follow Up Recommendations SNF (vs HHPT if family able to assist)    Equipment Recommendations  Other (comment) (TBD)    Recommendations for Other Services       Precautions / Restrictions        Mobility  Bed Mobility Overal bed mobility: Needs Assistance Bed Mobility: Supine to Sit  Supine to sit: Min guard  General bed mobility comments: cues for hand placement, light use of bedrail to upright trunk and scoot out to EOB    Transfers Overall transfer level: Needs assistance Equipment used: Rolling walker (2 wheeled) Transfers: Sit to/from [facility] Sit to Stand: Min guard Stand pivot transfers: Min guard    General transfer comment: min G to power up from EOB, slight weight shift to LLE but denies R foot pain, slightly unsteady with rising  Ambulation/Gait Ambulation/Gait assistance: Min assist Gait  Distance (Feet): 5 Feet Assistive device: Rolling walker (2 wheeled) Gait Pattern/deviations: Decreased weight shift to right;Step-to pattern;Decreased stride length;Wide base of support Gait velocity: decreased   General Gait Details: decreased weightshift to RLE, short steps with UE on RW, maintains wide BOS, fatigues requiring seated rest break, denies pain  Stairs            Wheelchair Mobility    Modified Rankin (Stroke Patients Only)       Balance Overall balance assessment: Needs assistance Sitting-balance support: Feet supported;No upper extremity supported Sitting balance-Leahy Scale: Good Sitting balance - Comments: seated EOB   Standing balance support: During functional activity;Bilateral upper extremity supported Standing balance-Leahy Scale: Poor Standing balance comment: reliant on UE support        Pertinent Vitals/Pain Pain Assessment: No/denies pain    Home Living Family/patient expects to be discharged to:: Private residence Living Arrangements: Spouse/significant other Available Help at Discharge: Family Type of Home: House Home Access: Stairs to enter   [employer] of Steps: not sure Home Layout: One level Home Equipment: None      Prior Function Level of Independence: Independent         Comments: Pt very HOH, tangential speech, reports he is independent, doesn't need anything to help him walk, wife is at home with him, steps to enter home but unsure how many; pt oriented to self and year only, unsure how reliable information is.     Hand Dominance        Extremity/Trunk Assessment        Lower Extremity Assessment  Lower Extremity Assessment: RLE deficits/detail;LLE deficits/detail RLE Deficits / Details: grossly 3+/5 strength throughout RLE, denies numbness/tingling R ankle bony deformity with increased pes planus RLE Sensation: WNL LLE Deficits / Details: grossly 3+/5 strength throughout RLE, denies  numbness/tingling LLE Sensation: WNL       Communication   Communication: No difficulties  Cognition Arousal/Alertness: Awake/alert Behavior During Therapy: Impulsive Overall Cognitive Status: No family/caregiver present to determine baseline cognitive functioning  General Comments: Pt with tangential speech, requires redirection to task and conversation, max cues for sequencing to follow commands, oriented to self and year only. Pt is very self motivated and slightly impulsive with mobility requiring safety cues.      General Comments      Exercises     Assessment/Plan    PT Assessment Patient needs continued PT services  PT Problem List Decreased strength;Decreased activity tolerance;Decreased balance;Decreased knowledge of use of DME;Decreased safety awareness;Obesity;Cardiopulmonary status limiting activity       PT Treatment Interventions DME instruction;Gait training;Functional mobility training;Therapeutic activities;Therapeutic exercise;Balance training;Neuromuscular re-education;Patient/family education    PT Goals (Current goals can be found in the Care Plan section)  Acute Rehab PT Goals Patient Stated Goal: return home PT Goal Formulation: With patient Time For Goal Achievement: 09/24/20 Potential to Achieve Goals: Good    Frequency Min 2X/week   Barriers to discharge        Co-evaluation               AM-PAC PT "6 Clicks" Mobility  Outcome Measure Help needed turning from your back to your side while in a flat bed without using bedrails?: A Little Help needed moving from lying on your back to sitting on the side of a flat bed without using bedrails?: A Little Help needed moving to and from a bed to a chair (including a wheelchair)?: A Little Help needed standing up from a chair using your arms (e.g., wheelchair or bedside chair)?: A Little Help needed to walk in hospital room?: A Little Help needed climbing 3-5 steps with a railing? : Total 6  Click Score: 16    End of Session Equipment Utilized During Treatment: Gait belt Activity Tolerance: Patient tolerated treatment well Patient left: in chair;with call bell/phone within reach;with chair alarm set;with nursing/sitter in room Nurse Communication: Mobility status;Other (comment) (R foot) PT Visit Diagnosis: Unsteadiness on feet (R26.81);Other abnormalities of gait and mobility (R26.89)    Time: 3903-0092 PT Time Calculation (min) (ACUTE ONLY): 23 min   Charges:   PT Evaluation $PT Eval Low Complexity: 1 Low           Tori Saul Dorsi PT, DPT 09/10/20, 2:03 PM [phone]

## 2020-09-10 NOTE — Progress Notes (Signed)
Patient Demographics:    [name] [name], is a 60 y.o. male, DOB - 1961-08-02, RCV:[id number]  Admit date - 09/07/2020   Admitting Physician Bernadette Hoit, DO  Outpatient Primary MD for the patient is Hermine Messick, MD  LOS - 3   Chief Complaint  Patient presents with  . Weakness        Subjective:    [name] [name] today has no fevers, no emesis,  No chest pain,   talkactive +BM Oral intake is fair (has to be fed)  Assessment  & Plan :    Principal Problem:   Hepatic encephalopathy (HCC) Active Problems:   COPD (chronic obstructive pulmonary disease) (HCC)   HTN (hypertension)   Cirrhosis (HCC)   GERD (gastroesophageal reflux disease)   Thrombocytopenia (HCC)   Hyperammonemia (HCC)  Brief Summary:- 60 y.o. male with medical history significant for liver cirrhosis, seizure disorder, thrombocytopenia, history of hepatitis, hepatic encephalopathy, type 2 diabetes mellitus, COPD, dementia, CVA, chronic systolic heart failure, hypertension urgency admitted on 09/07/2020 with altered mentation secondary to hepatic encephalopathy with rising ammonia levels  A/p 1) hepatic encephalopathy in the setting of nonalcoholic liver cirrhosis improving after lactulose enemas --transitioned to p.o. lactulose --patient is now more awake and able to take oral intake -Continue rifaximin -- Ammonia trended down and clinically patient is improving  2) chronic thrombocytopenia and hyperbilirubinemia in the setting of liver failure liver cirrhosis----continue to monitor -No bleeding concerns at this time  3) COPD--- no acute exacerbation, continue bronchodilators  4)HTN- stable, continue metoprolol 100 mg twice daily  5)GERD-Protonix/ordered  6)Social/Ethics--tried reaching family at [phone]--- a gentleman answered and told me that this was a wrong number -Tried calling spouse at [phone] but no  answer -- Patient remains a DNR/DNI [place]  7) generalized weakness and ambulatory dysfunction--- awaiting PT eval, may need home health PT versus SNF rehab  Disposition/Need for in-Hospital Stay- patient unable to be discharged at this time due to --acute metabolic encephalopathy requiring lactulose enemas and IV fluids until oral intake is more reliable  Status is: Inpatient  Remains inpatient appropriate because:Please see above   Disposition: The patient is from: Home              Anticipated d/c is to: Pending home health versus SNF              Anticipated d/c date is: 1 day              Patient currently is not medically stable to d/c. Barriers: Not Clinically Stable-   Code Status :  -  Code Status: DNR   Family Communication: Called and updated patient's wife by phone  Consults  :  na  DVT Prophylaxis  :   - SCDs SCDs Start: 09/07/20 2234    Lab Results  Component Value Date   PLT 41 (L) 09/10/2020    Inpatient Medications  Scheduled Meds: . feeding supplement  237 mL Oral BID BM  . lactulose  30 g Oral TID  . metoprolol tartrate  100 mg Oral BID  . pantoprazole  40 mg Oral Daily  . rifaximin  550 mg Oral BID  . umeclidinium-vilanterol  1 puff Inhalation Daily   Continuous Infusions: .  dextrose 5 % and 0.45% NaCl 50 mL/hr at 09/08/20 2147   PRN Meds:.albuterol    Anti-infectives (From admission, onward)   Start     Dose/Rate Route Frequency Ordered Stop   09/08/20 1000  rifaximin (XIFAXAN) tablet 550 mg        550 mg Oral 2 times daily 09/07/20 2248          Objective:   Vitals:   09/09/20 1429 09/09/20 1431 09/09/20 2112 09/10/20 0529  BP: 119/68 115/73 (!) 111/51 125/69  Pulse: (!) 52 (!) 53 (!) 52 (!) 49  Resp: 18 18 18 18   Temp: 98.5 F (36.9 C) 98.5 F (36.9 C) 97.7 F (36.5 C) 98 F (36.7 C)  TempSrc: Oral Oral Oral Oral  SpO2:  98% 95% 98%  Weight:      Height:        Wt Readings from Last 3 Encounters:   09/09/20 83.3 kg  04/29/20 111.1 kg  01/23/19 100.8 kg     Intake/Output Summary (Last 24 hours) at 09/10/2020 1258 Last data filed at 09/10/2020 0900 Gross per 24 hour  Intake 930.83 ml  Output 1775 ml  Net -844.17 ml     Physical Exam  Gen:-Awake and able to talk, in no apparent distress  HEENT:- [place].AT,  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  Lungs-  CTAB , fair symmetrical air movement CV- S1, S2 normal, regular  Abd-  +ve B.Sounds, Abd Soft, No tenderness,    Extremity/Skin: 1+ pitting edema, pedal pulses present  Psych-oriented x2 , affect is flat neuro-generalized weakness, no new focal deficits,   Data Review:   Micro Results Recent Results (from the past 240 hour(s))  Urine Culture     Status: None   Collection Time: 09/07/20  9:00 PM   Specimen: Urine, Clean Catch  Result Value Ref Range Status   Specimen Description   Final    URINE, CLEAN CATCH Performed at [facility], [address] [place], [place] [address]    Special Requests   Final    NONE Performed at [facility], [address], [place], [place] [address]    Culture   Final    NO GROWTH Performed at [facility], [place] [address], [place], [place] [address]    Report Status 09/09/2020 FINAL  Final  Resp Panel by RT-PCR (Flu A&B, Covid) Nasopharyngeal Swab     Status: None   Collection Time: 09/07/20 10:11 PM   Specimen: Nasopharyngeal Swab; Nasopharyngeal(NP) swabs in vial transport medium  Result Value Ref Range Status   SARS Coronavirus 2 by RT PCR NEGATIVE NEGATIVE Final    Comment: (NOTE) SARS-CoV-2 target nucleic acids are NOT DETECTED.  The SARS-CoV-2 RNA is generally detectable in upper respiratory specimens during the acute phase of infection. The lowest concentration of SARS-CoV-2 viral copies this assay can detect is 138 copies/mL. A negative result does not preclude SARS-Cov-2 infection and should not be used as the sole basis for treatment  or other patient management decisions. A negative result may occur with  improper specimen collection/handling, submission of specimen other than nasopharyngeal swab, presence of viral mutation(s) within the areas targeted by this assay, and inadequate number of viral copies(<138 copies/mL). A negative result must be combined with clinical observations, patient history, and epidemiological information. The expected result is Negative.  Fact Sheet for Patients:  EntrepreneurPulse.com.au  Fact Sheet for Healthcare Providers:  IncredibleEmployment.be  This test is no t yet approved or cleared  by the [place] and  has been authorized for detection and/or diagnosis of SARS-CoV-2 by FDA under an Emergency Use Authorization (EUA). This EUA will remain  in effect (meaning this test can be used) for the duration of the COVID-19 declaration under Section 564(b)(1) of the Act, 21 U.S.C.section 360bbb-3(b)(1), unless the authorization is terminated  or revoked sooner.       Influenza A by PCR NEGATIVE NEGATIVE Final   Influenza B by PCR NEGATIVE NEGATIVE Final    Comment: (NOTE) The Xpert Xpress SARS-CoV-2/FLU/RSV plus assay is intended as an aid in the diagnosis of influenza from Nasopharyngeal swab specimens and should not be used as a sole basis for treatment. Nasal washings and aspirates are unacceptable for Xpert Xpress SARS-CoV-2/FLU/RSV testing.  Fact Sheet for Patients: EntrepreneurPulse.com.au  Fact Sheet for Healthcare Providers: IncredibleEmployment.be  This test is not yet approved or cleared by the [place] FDA and has been authorized for detection and/or diagnosis of SARS-CoV-2 by FDA under an Emergency Use Authorization (EUA). This EUA will remain in effect (meaning this test can be used) for the duration of the COVID-19 declaration under Section 564(b)(1) of the Act, 21 U.S.C. section  360bbb-3(b)(1), unless the authorization is terminated or revoked.  Performed at [facility], [address], [place],  [address]     Radiology Reports US Venous Img Lower Bilateral (DVT)  Result Date: 09/08/2020 CLINICAL DATA:  Bilateral lower extremity pain and edema. Evaluate for DVT. EXAM: BILATERAL LOWER EXTREMITY VENOUS DOPPLER ULTRASOUND TECHNIQUE: Gray-scale sonography with graded compression, as well as color Doppler and duplex ultrasound were performed to evaluate the lower extremity deep venous systems from the level of the common femoral vein and including the common femoral, femoral, profunda femoral, popliteal and calf veins including the posterior tibial, peroneal and gastrocnemius veins when visible. The superficial great saphenous vein was also interrogated. Spectral Doppler was utilized to evaluate flow at rest and with distal augmentation maneuvers in the common femoral, femoral and popliteal veins. COMPARISON:  None. FINDINGS: RIGHT LOWER EXTREMITY Common Femoral Vein: No evidence of thrombus. Normal compressibility, respiratory phasicity and response to augmentation. Saphenofemoral Junction: No evidence of thrombus. Normal compressibility and flow on color Doppler imaging. Profunda Femoral Vein: No evidence of thrombus. Normal compressibility and flow on color Doppler imaging. Femoral Vein: No evidence of thrombus. Normal compressibility, respiratory phasicity and response to augmentation. Popliteal Vein: No evidence of thrombus. Normal compressibility, respiratory phasicity and response to augmentation. Calf Veins: Appear patent where visualized. Superficial Great Saphenous Vein: No evidence of thrombus. Normal compressibility. Venous Reflux:  None. Other Findings:  None. LEFT LOWER EXTREMITY Common Femoral Vein: No evidence of thrombus. Normal compressibility, respiratory phasicity and response to augmentation. Saphenofemoral Junction: No evidence of thrombus. Normal  compressibility and flow on color Doppler imaging. Profunda Femoral Vein: No evidence of thrombus. Normal compressibility and flow on color Doppler imaging. Femoral Vein: No evidence of thrombus. Normal compressibility, respiratory phasicity and response to augmentation. Popliteal Vein: No evidence of thrombus. Normal compressibility, respiratory phasicity and response to augmentation. Calf Veins: Not well visualized. Superficial Great Saphenous Vein: No evidence of thrombus. Normal compressibility. Venous Reflux:  None. Other Findings:  None. IMPRESSION: No evidence of DVT within either lower extremity. Electronically Signed   By: Sandi Mariscal M.D.   On: 09/08/2020 10:36     CBC Recent Labs  Lab 09/07/20 2110 09/08/20 0430 09/10/20 0512  WBC 5.9 6.4 4.0  HGB 15.5 14.2 13.0  HCT 45.4 41.4 38.6*  PLT 44* 44* 41*  MCV 106.8* 105.9* 107.2*  MCH 36.5* 36.3* 36.1*  MCHC 34.1 34.3 33.7  RDW 14.5 14.4 14.3  LYMPHSABS 0.5*  --   --   MONOABS 0.3  --   --   EOSABS 0.0  --   --   BASOSABS 0.0  --   --     Chemistries  Recent Labs  Lab 09/07/20 2110 09/08/20 0430 09/09/20 0643 09/10/20 0512  NA 139 139 138 138  K 4.4 3.7 3.6 3.4*  CL 109 111 110 109  CO2 19* 24 23 23   GLUCOSE 129* 97 107* 101*  BUN 12 12 13 10   CREATININE 0.72 0.61 0.65 0.65  CALCIUM 9.2 8.8* 8.3* 8.3*  MG  --  1.8  --   --   AST 66* 78* 102* 96*  ALT 26 23 25 27   ALKPHOS 108 88 78 77  BILITOT 4.2* 4.0* 3.8* 3.5*   ------------------------------------------------------------------------------------------------------------------ No results for input(s): CHOL, HDL, LDLCALC, TRIG, CHOLHDL, LDLDIRECT in the last 72 hours.  Lab Results  Component Value Date   HGBA1C 5.9 (H) 12/07/2015   ------------------------------------------------------------------------------------------------------------------ No results for input(s): TSH, T4TOTAL, T3FREE, THYROIDAB in the last 72 hours.  Invalid input(s):  FREET3 ------------------------------------------------------------------------------------------------------------------ No results for input(s): VITAMINB12, FOLATE, FERRITIN, TIBC, IRON, RETICCTPCT in the last 72 hours.  Coagulation profile Recent Labs  Lab 09/08/20 0430  INR 1.6*    No results for input(s): DDIMER in the last 72 hours.  Cardiac Enzymes No results for input(s): CKMB, TROPONINI, MYOGLOBIN in the last 168 hours.  Invalid input(s): CK ------------------------------------------------------------------------------------------------------------------    Component Value Date/Time   BNP 142.0 (H) 04/24/2018 1835     Roxan Hockey M.D on 09/10/2020 at 12:58 PM  Go to [email or web address] - for contact info  [facility] - Office  [phone]

## 2020-09-11 LAB — GLUCOSE, CAPILLARY
Glucose-Capillary: 157 mg/dL — ABNORMAL HIGH (ref 70–99)
Glucose-Capillary: 114 mg/dL — ABNORMAL HIGH (ref 70–99)

## 2020-09-11 LAB — SARS CORONAVIRUS 2 BY RT PCR (HOSPITAL ORDER, PERFORMED IN ~~LOC~~ HOSPITAL LAB): SARS Coronavirus 2: NEGATIVE

## 2020-09-11 MED ORDER — ENSURE ENLIVE PO LIQD: 237.0000 mL | Freq: Two times a day (BID) | ORAL | 12 refills | Status: DC

## 2020-09-11 MED ORDER — FUROSEMIDE 20 MG PO TABS: 20.0000 mg | ORAL_TABLET | Freq: Every day | ORAL | 1 refills | Status: DC

## 2020-09-11 MED ORDER — LACTULOSE 10 GM/15ML PO SOLN: 40.0000 g | Freq: Three times a day (TID) | ORAL | 2 refills | Status: DC

## 2020-09-11 MED ORDER — RIFAXIMIN 550 MG PO TABS: 550.0000 mg | ORAL_TABLET | Freq: Two times a day (BID) | ORAL | 3 refills | Status: DC

## 2020-09-11 MED ORDER — ALPRAZOLAM 0.5 MG PO TABS: 1.0000 mg | ORAL_TABLET | Freq: Two times a day (BID) | ORAL | 0 refills | Status: DC | PRN

## 2020-09-11 MED ORDER — POTASSIUM CHLORIDE CRYS ER 20 MEQ PO TBCR
40.0000 meq | EXTENDED_RELEASE_TABLET | Freq: Once | ORAL | Status: AC
  Administered 2020-09-11: 40 meq via ORAL
  Filled 2020-09-11: qty 2

## 2020-09-11 MED ORDER — PANTOPRAZOLE SODIUM 40 MG PO TBEC: 40.0000 mg | DELAYED_RELEASE_TABLET | Freq: Every day | ORAL | 3 refills | Status: DC

## 2020-09-11 MED ORDER — ONDANSETRON HCL 4 MG PO TABS: 4.0000 mg | ORAL_TABLET | Freq: Four times a day (QID) | ORAL | 0 refills | Status: DC | PRN

## 2020-09-11 NOTE — Discharge Summary (Signed)
[name] [name], is a 60 y.o. male  DOB [birth year]  MRN [id number].  Admission date:  09/07/2020  Admitting Physician  Bernadette Hoit, DO  Discharge Date:  09/11/2020   Primary MD  Hermine Messick, MD  Recommendations for primary care physician for things to follow:   1)Avoid ibuprofen/Advil/Aleve/Motrin/Goody Powders/Naproxen/BC powders/Meloxicam/Diclofenac/Indomethacin and other Nonsteroidal anti-inflammatory medications as these will make you more likely to bleed and can cause stomach ulcers, can also cause Kidney problems.   2)Avoid constipation  3) please take rifaximin and lactulose as prescribed  4) CBC and CMP blood test every Monday x3 weeks starting 09/15/2020  Admission Diagnosis  Hepatic encephalopathy (HCC) [K72.90] Acute encephalopathy [G93.40] Right leg swelling [M79.89] Cirrhosis of liver without ascites, unspecified hepatic cirrhosis type ([place]) [K74.60]   Discharge Diagnosis  Hepatic encephalopathy ([name]) [K72.90] Acute encephalopathy [G93.40] Right leg swelling [M79.89] Cirrhosis of liver without ascites, unspecified hepatic cirrhosis type ([place]) [K74.60]    Principal Problem:   Hepatic encephalopathy (HCC) Active Problems:   COPD (chronic obstructive pulmonary disease) (HCC)   HTN (hypertension)   Cirrhosis (HCC)   GERD (gastroesophageal reflux disease)   Thrombocytopenia (HCC)   Hyperammonemia (HCC)      Past Medical History:  Diagnosis Date  . Anemia   . Ankle deformity, right    chronic  . Anxiety   . Arthritis   . Bipolar 1 disorder ([place])   . Blind right eye   . Blood clots in brain    per patient  . Cardiomegaly   . Cataract    Right eye  . Cerebrovascular disease    Right vertebral artery  . Cholelithiasis    Asymptomatic  . Chronic back pain   . Chronic pain   . 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 Cirrhosis ([place])    diagnosed 07/2010 when presented  with first episode of hepatic encephalopathy, afp on 416/13=4.8,  U/S on 12/14/11  liver stable, pt has received 2 hep A/B vaccines  . COPD (chronic obstructive pulmonary disease) ([place])   . Dementia ([place])   . Depression   . Diabetes mellitus   . Diverticulosis   . Dyspnea   . GERD (gastroesophageal reflux disease)   . Hard of hearing   . Hepatic encephalopathy ([place])   . Hepatitis   . Hyperlipidemia   . Hypertension   . Lymphedema    R>L  . Mental retardation   . Noncompliance   . Seizures ([place])   . Thrombocytopenia ([place])     Past Surgical History:  Procedure Laterality Date  . CAST APPLICATION  02/25/1606   Procedure: MINOR CAST APPLICATION;  Surgeon: [name], MD;  Location: [facility];  Service: Orthopedics;  Laterality: Right;  no anesthesia , procedure room do not need or room !  . COLONOSCOPY  12/15/11   colonic divericulosis;poor prep, ACBE  recommended but has not been done  . COLONOSCOPY WITH PROPOFOL N/A 11/07/2017   Dr. Gala Romney: inadequate prep, internal hemorrhoids. Early interval due March 2020 due to poor  prep  . ear operations    . ESOPHAGOGASTRODUODENOSCOPY  02/02/2011   Rourk-Normal esophagus.  No varices/Question mild portal gastropathy, extrinsic compression on the antrum, lesser curvature of uncertain significance, some minimally  nodular mucosa status post biopsy, patent pylorus, normal duodenum 1 and duodenum 2.  . ESOPHAGOGASTRODUODENOSCOPY  12/15/11   Rourk-->mild changes of portal gastropathy, gastric/duodenal erosions s/p bx  (reactive changes with mild chronic inflammation. No H.pylori  . ESOPHAGOGASTRODUODENOSCOPY (EGD) WITH PROPOFOL N/A 11/07/2017   Dr. Gala Romney: normal esophagus s/p dilation, portal hypertensive gastropathy, erosive gastropathy, normal duodenum  . FOOT SURGERY    . MALONEY DILATION N/A 11/07/2017   Procedure: Venia Minks DILATION;  Surgeon: Daneil Dolin, MD;  Location: [facility] SUITE;  Service: Endoscopy;  Laterality: N/A;  . ORIF ANKLE  FRACTURE  08/27/2011   Procedure: OPEN REDUCTION INTERNAL FIXATION (ORIF) ANKLE FRACTURE;  Surgeon: [name], MD;  Location: [facility];  Service: Orthopedics;  Laterality: Right;  . TONSILLECTOMY       HPI  from the history and physical done on the day of admission:    Chief Complaint: Altered mental status  HPI: [name] [name] is a 60 y.o. male with medical history significant for liver cirrhosis, seizure disorder, thrombocytopenia, history of hepatitis, hepatic encephalopathy, type 2 diabetes mellitus, COPD, dementia, CVA, chronic systolic heart failure, hypertension urgency department due to patient today, patient lives at home with spouse and ED was noted to be sitting on the commode from 11 AM this morning till around 8 PM, wife activated EMS so that patient could be helped to the bed considering that he has been sitting on the commode for about 9 hours, on arrival of EMS, patient was noted to be more confused. Patient usually was able to take care of most of his ADLs without assistance. Reason for sitting on the commode for 9 hours was unknown. There was no complaint of chest pain, shortness of breath, nausea, vomiting or diarrhea.  ED Course: In the emergency department, BP was 154/93, otherwise other vital signs are within normal range. Work-up in the ED showed thrombocytopenia, MCV 106.8, ammonia level 100, T bili 4.2, AST 66, ALT 26, ALP 108. Urinalysis showed proteinuria and hematuria. Lactulose was given. Hospitalist was asked admit patient for further evaluation and management.  Review of Systems: This cannot be obtained at this time due to patient's current condition.     Hospital Course:    Brief Summary:- 60 y.o.malewith medical history significant forliver cirrhosis, seizure disorder, thrombocytopenia, history of hepatitis, hepatic encephalopathy, type 2 diabetes mellitus, COPD, dementia, CVA, chronic systolic heart failure, hypertensionurgency admitted on 09/07/2020  with altered mentation secondary to hepatic encephalopathy with rising ammonia levels  A/p 1) hepatic encephalopathy in the setting of non-alcoholic liver cirrhosis improving after lactulose enemas --transitioned to p.o. lactulose --patient is now more awake and able to take oral intake -- Ammonia trended down and clinically patient improved significantly -Okay to discharge on rifaximin and lactulose  2) chronic thrombocytopenia and hyperbilirubinemia in the setting of liver failure liver cirrhosis---- -No bleeding concerns at this time  3) COPD--- no acute exacerbation, continue bronchodilators  4)HTN- stable, continue metoprolol   5)GERD-PPI as ordered  6)Social/Ethics---- Patient remains a DNR/DNI, discussed with patient's wife Blind in [place]  7) generalized weakness and ambulatory dysfunction---  PT eval appreciated recommends transfer to SNF rehab  Disposition--- discharge to SNF rehab   Disposition: The patient is from: Home  Anticipated d/c is to:  SNF  Code Status :  -  Code Status: DNR   Family Communication: Called and updated patient's wife by phone  Consults  :  na  Discharge Condition: Stable  Follow UP   Contact information for after-discharge care    [facility] .   Service: Skilled Nursing Contact information: [address] [address] [phone]                  Diet and Activity recommendation:  As advised  Discharge Instructions     Discharge Instructions    Call MD for:  difficulty breathing, headache or visual disturbances   Complete by: As directed    Call MD for:  persistant dizziness or light-headedness   Complete by: As directed    Call MD for:  persistant nausea and vomiting   Complete by: As directed    Call MD for:  severe uncontrolled pain   Complete by: As directed    Call MD for:  temperature >100.4   Complete by:  As directed    Diet - low sodium heart healthy   Complete by: As directed    Discharge instructions   Complete by: As directed    1)Avoid ibuprofen/Advil/Aleve/Motrin/Goody Powders/Naproxen/BC powders/Meloxicam/Diclofenac/Indomethacin and other Nonsteroidal anti-inflammatory medications as these will make you more likely to bleed and can cause stomach ulcers, can also cause Kidney problems.   2)Avoid constipation  3) please take rifaximin and lactulose as prescribed  4) CBC and CMP blood test every Monday x3 weeks starting 09/15/2020   Increase activity slowly   Complete by: As directed         Discharge Medications     Allergies as of 09/11/2020      Reactions   Penicillins Rash   Has patient had a PCN reaction causing immediate rash, facial/tongue/throat swelling, SOB or lightheadedness with hypotension: Yes Has patient had a PCN reaction causing severe rash involving mucus membranes or skin necrosis: No Has patient had a PCN reaction that required hospitalization No Has patient had a PCN reaction occurring within the last 10 years: No If all of the above answers are "NO", then may proceed with Cephalosporin use.      Medication List    STOP taking these medications   ezetimibe 10 MG tablet Commonly known as: ZETIA   HYDROcodone-acetaminophen 5-325 MG tablet Commonly known as: NORCO/VICODIN   oxyCODONE 5 MG immediate release tablet Commonly known as: Oxy IR/ROXICODONE   traZODone 50 MG tablet Commonly known as: DESYREL     TAKE these medications   albuterol 108 (90 Base) MCG/ACT inhaler Commonly known as: VENTOLIN HFA Inhale 2 puffs into the lungs every 4 (four) hours as needed for wheezing or shortness of breath.   ALPRAZolam 0.5 MG tablet Commonly known as: XANAX Take 2 tablets (1 mg total) by mouth 2 (two) times daily as needed for anxiety or sleep. What changed:   medication strength  when to take this  reasons to take this   ammonium lactate 12 %  cream Commonly known as: AMLACTIN Apply 1 application topically daily as needed.   feeding supplement Liqd Take 237 mLs by mouth 2 (two) times daily between meals.   furosemide 20 MG tablet Commonly known as: LASIX Take 1 tablet (20 mg total) by mouth daily.   isosorbide dinitrate 10 MG tablet Commonly known as: ISORDIL Take 1 tablet (10 mg total) by mouth 2 (two) times daily.   lactulose  10 GM/15ML solution Commonly known as: CHRONULAC Take 60 mLs (40 g total) by mouth 3 (three) times daily. Titrate for 3-4 bowel motions daily.   metoprolol tartrate 50 MG tablet Commonly known as: LOPRESSOR Take 50 mg by mouth 3 (three) times daily.   ondansetron 4 MG tablet Commonly known as: ZOFRAN Take 1 tablet (4 mg total) by mouth every 6 (six) hours as needed for nausea or vomiting. What changed: reasons to take this   pantoprazole 40 MG tablet Commonly known as: PROTONIX Take 1 tablet (40 mg total) by mouth daily.   potassium chloride SA 20 MEQ tablet Commonly known as: KLOR-CON Take 1 tablet (20 mEq total) by mouth daily.   QUEtiapine 50 MG tablet Commonly known as: SEROQUEL Take 50 mg by mouth at bedtime.   rifaximin 550 MG Tabs tablet Commonly known as: XIFAXAN Take 1 tablet (550 mg total) by mouth 2 (two) times daily.   saccharomyces boulardii 250 MG capsule Commonly known as: Florastor Take 1 capsule (250 mg total) by mouth 2 (two) times daily.   silver sulfADIAZINE 1 % cream Commonly known as: SILVADENE Apply 1 application topically daily. As directed   umeclidinium-vilanterol 62.5-25 MCG/INH Aepb Commonly known as: ANORO ELLIPTA Inhale 1 puff into the lungs daily.       Major procedures and Radiology Reports - PLEASE review detailed and final reports for all details, in brief -   US Venous Img Lower Bilateral (DVT)  Result Date: 09/08/2020 CLINICAL DATA:  Bilateral lower extremity pain and edema. Evaluate for DVT. EXAM: BILATERAL LOWER EXTREMITY VENOUS  DOPPLER ULTRASOUND TECHNIQUE: Gray-scale sonography with graded compression, as well as color Doppler and duplex ultrasound were performed to evaluate the lower extremity deep venous systems from the level of the common femoral vein and including the common femoral, femoral, profunda femoral, popliteal and calf veins including the posterior tibial, peroneal and gastrocnemius veins when visible. The superficial great saphenous vein was also interrogated. Spectral Doppler was utilized to evaluate flow at rest and with distal augmentation maneuvers in the common femoral, femoral and popliteal veins. COMPARISON:  None. FINDINGS: RIGHT LOWER EXTREMITY Common Femoral Vein: No evidence of thrombus. Normal compressibility, respiratory phasicity and response to augmentation. Saphenofemoral Junction: No evidence of thrombus. Normal compressibility and flow on color Doppler imaging. Profunda Femoral Vein: No evidence of thrombus. Normal compressibility and flow on color Doppler imaging. Femoral Vein: No evidence of thrombus. Normal compressibility, respiratory phasicity and response to augmentation. Popliteal Vein: No evidence of thrombus. Normal compressibility, respiratory phasicity and response to augmentation. Calf Veins: Appear patent where visualized. Superficial Great Saphenous Vein: No evidence of thrombus. Normal compressibility. Venous Reflux:  None. Other Findings:  None. LEFT LOWER EXTREMITY Common Femoral Vein: No evidence of thrombus. Normal compressibility, respiratory phasicity and response to augmentation. Saphenofemoral Junction: No evidence of thrombus. Normal compressibility and flow on color Doppler imaging. Profunda Femoral Vein: No evidence of thrombus. Normal compressibility and flow on color Doppler imaging. Femoral Vein: No evidence of thrombus. Normal compressibility, respiratory phasicity and response to augmentation. Popliteal Vein: No evidence of thrombus. Normal compressibility, respiratory  phasicity and response to augmentation. Calf Veins: Not well visualized. Superficial Great Saphenous Vein: No evidence of thrombus. Normal compressibility. Venous Reflux:  None. Other Findings:  None. IMPRESSION: No evidence of DVT within either lower extremity. Electronically Signed   By: Sandi Mariscal M.D.   On: 09/08/2020 10:36    Micro Results    Recent Results (from the past 240 hour(s))  Urine  Culture     Status: None   Collection Time: 09/07/20  9:00 PM   Specimen: Urine, Clean Catch  Result Value Ref Range Status   Specimen Description   Final    URINE, CLEAN CATCH Performed at [facility], [address], [place], [place] [address]    Special Requests   Final    NONE Performed at [facility], [address] [place], [place] [address]    Culture   Final    NO GROWTH Performed at [facility], [place] [address] [place], [place] [address]    Report Status 09/09/2020 FINAL  Final  Resp Panel by RT-PCR (Flu A&B, Covid) Nasopharyngeal Swab     Status: None   Collection Time: 09/07/20 10:11 PM   Specimen: Nasopharyngeal Swab; Nasopharyngeal(NP) swabs in vial transport medium  Result Value Ref Range Status   SARS Coronavirus 2 by RT PCR NEGATIVE NEGATIVE Final    Comment: (NOTE) SARS-CoV-2 target nucleic acids are NOT DETECTED.  The SARS-CoV-2 RNA is generally detectable in upper respiratory specimens during the acute phase of infection. The lowest concentration of SARS-CoV-2 viral copies this assay can detect is 138 copies/mL. A negative result does not preclude SARS-Cov-2 infection and should not be used as the sole basis for treatment or other patient management decisions. A negative result may occur with  improper specimen collection/handling, submission of specimen other than nasopharyngeal swab, presence of viral mutation(s) within the areas targeted by this assay, and inadequate number of viral copies(<138 copies/mL). A negative result must be combined  with clinical observations, patient history, and epidemiological information. The expected result is Negative.  Fact Sheet for Patients:  EntrepreneurPulse.com.au  Fact Sheet for Healthcare Providers:  IncredibleEmployment.be  This test is no t yet approved or cleared by the [place] FDA and  has been authorized for detection and/or diagnosis of SARS-CoV-2 by FDA under an Emergency Use Authorization (EUA). This EUA will remain  in effect (meaning this test can be used) for the duration of the COVID-19 declaration under Section 564(b)(1) of the Act, 21 U.S.C.section 360bbb-3(b)(1), unless the authorization is terminated  or revoked sooner.       Influenza A by PCR NEGATIVE NEGATIVE Final   Influenza B by PCR NEGATIVE NEGATIVE Final    Comment: (NOTE) The Xpert Xpress SARS-CoV-2/FLU/RSV plus assay is intended as an aid in the diagnosis of influenza from Nasopharyngeal swab specimens and should not be used as a sole basis for treatment. Nasal washings and aspirates are unacceptable for Xpert Xpress SARS-CoV-2/FLU/RSV testing.  Fact Sheet for Patients: EntrepreneurPulse.com.au  Fact Sheet for Healthcare Providers: IncredibleEmployment.be  This test is not yet approved or cleared by the [place] FDA and has been authorized for detection and/or diagnosis of SARS-CoV-2 by FDA under an Emergency Use Authorization (EUA). This EUA will remain in effect (meaning this test can be used) for the duration of the COVID-19 declaration under Section 564(b)(1) of the Act, 21 U.S.C. section 360bbb-3(b)(1), unless the authorization is terminated or revoked.  Performed at [facility], [address], [place], [place] [address]   SARS Coronavirus 2 by RT PCR (hospital order, performed in [facility] hospital lab) Nasopharyngeal Nasopharyngeal Swab     Status: None   Collection Time: 09/11/20 12:18 PM   Specimen:  Nasopharyngeal Swab  Result Value Ref Range Status   SARS Coronavirus 2 NEGATIVE NEGATIVE Final    Comment: (NOTE) SARS-CoV-2 target nucleic acids are NOT DETECTED.  The SARS-CoV-2 RNA is generally  detectable in upper and lower respiratory specimens during the acute phase of infection. The lowest concentration of SARS-CoV-2 viral copies this assay can detect is 250 copies / mL. A negative result does not preclude SARS-CoV-2 infection and should not be used as the sole basis for treatment or other patient management decisions.  A negative result may occur with improper specimen collection / handling, submission of specimen other than nasopharyngeal swab, presence of viral mutation(s) within the areas targeted by this assay, and inadequate number of viral copies (<250 copies / mL). A negative result must be combined with clinical observations, patient history, and epidemiological information.  Fact Sheet for Patients:   StrictlyIdeas.no  Fact Sheet for Healthcare Providers: BankingDealers.co.za  This test is not yet approved or  cleared by the [place] FDA and has been authorized for detection and/or diagnosis of SARS-CoV-2 by FDA under an Emergency Use Authorization (EUA).  This EUA will remain in effect (meaning this test can be used) for the duration of the COVID-19 declaration under Section 564(b)(1) of the Act, 21 U.S.C. section 360bbb-3(b)(1), unless the authorization is terminated or revoked sooner.  Performed at [facility], [address] [place], [place] [address]        Today   Subjective    [name] [name] today has no new concerns -Much improved after BMs with lactulose          Patient has been seen and examined prior to discharge   Objective   Blood pressure 128/70, pulse 62, temperature 98.7 F (37.1 C), temperature source Oral, resp. rate 18, height 5' 7"  (1.702 m), weight 83.3 kg, SpO2 99  %.   Intake/Output Summary (Last 24 hours) at 09/11/2020 1558 Last data filed at 09/11/2020 0300 Gross per 24 hour  Intake -  Output 1100 ml  Net -1100 ml    Exam Gen:-Awake and able to talk, in no apparent distress  HEENT:- [place].AT,  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  Lungs-  CTAB , fair symmetrical air movement CV- S1, S2 normal, regular  Abd-  +ve B.Sounds, Abd Soft, No tenderness,    Extremity/Skin: trace pitting edema, pedal pulses present  Psych-oriented x2 , affect is flat neuro-generalized weakness, no new focal deficits,   Data Review   CBC w Diff:  Lab Results  Component Value Date   WBC 4.0 09/10/2020   HGB 13.0 09/10/2020   HGB 14.5 11/13/2008   HCT 38.6 (L) 09/10/2020   HCT 28 04/06/2012   PLT 41 (L) 09/10/2020   PLT 97 (L) 11/13/2008   LYMPHOPCT 9 09/07/2020   LYMPHOPCT 45.4 11/13/2008   MONOPCT 6 09/07/2020   MONOPCT 7.9 11/13/2008   EOSPCT 0 09/07/2020   EOSPCT 3.3 11/13/2008   BASOPCT 0 09/07/2020   BASOPCT 0.7 11/13/2008    CMP:  Lab Results  Component Value Date   NA 138 09/10/2020   NA 144 04/06/2012   K 3.4 (L) 09/10/2020   K 4.4 04/06/2012   CL 109 09/10/2020   CO2 23 09/10/2020   BUN 10 09/10/2020   CREATININE 0.65 09/10/2020   CREATININE 0.71 12/15/2017   PROT 5.1 (L) 09/10/2020   ALBUMIN 2.5 (L) 09/10/2020   ALBUMIN 2.7 04/06/2012   BILITOT 3.5 (H) 09/10/2020   BILITOT 0.9 04/06/2012   ALKPHOS 77 09/10/2020   ALKPHOS 112 04/06/2012   AST 96 (H) 09/10/2020   AST 36 04/06/2012   ALT 27 09/10/2020  .   Total Discharge time is about 39  minutes  Roxan Hockey M.D on 09/11/2020 at 3:58 PM  Go to [email or web address] -  for contact info  [facility] - Office  [phone]

## 2020-09-11 NOTE — Plan of Care (Signed)

## 2020-09-11 NOTE — Progress Notes (Signed)
discharge instructions given to nurse Tonita Cong [facility] [place]. Answered all questions at this time. All belongings sent with patient. Await on EMS.

## 2020-09-11 NOTE — Care Management Important Message (Signed)
Important Message  Patient Details  Name: [name] [name] MRN: [id number] Date of Birth: 01-23-1961   Medicare Important Message Given:  Yes (mailed to address on file as requested)     Tommy Medal 09/11/2020, 4:02 PM

## 2020-09-11 NOTE — Discharge Instructions (Signed)
1)Avoid ibuprofen/Advil/Aleve/Motrin/Goody Powders/Naproxen/BC powders/Meloxicam/Diclofenac/Indomethacin and other Nonsteroidal anti-inflammatory medications as these will make you more likely to bleed and can cause stomach ulcers, can also cause Kidney problems.   2)Avoid constipation  3) please take rifaximin and lactulose as prescribed  4) CBC and CMP blood test every Monday x3 weeks starting 09/15/2020

## 2020-09-11 NOTE — Progress Notes (Signed)
Transition of Care (TOC) -30 day Note       Patient Details  Name: Lorenz Donley MRN: [id number] Date of Birth: [birth year]   Transition of Care [facility]) CM/SW Contact  Name: Shade Flood Phone Number: [phone] Date: 09/11/2020 Time: 10:38   MUST ID: [id number]   To Whom it May Concern:   Please be advised that the above patient has a primary diagnosis of dementia which supersedes any psychiatric diagnosis.

## 2020-09-11 NOTE — TOC Transition Note (Signed)
Transition of Care [facility]) - CM/SW Discharge Note   Patient Details  Name: [name] [name] MRN: [id number] Date of Birth: 09-09-1960  Transition of Care [facility]) CM/SW Contact:  Shade Flood, LCSW Phone Number: 09/11/2020, 2:51 PM   Clinical Narrative:     Pt medically stable for dc today per MD. Spoke with pt's wife, [place], to update on bed offers and she is agreeable to [facility] Bryson Ha at [facility] states that they can take pt today. DC clinical will be sent electronically. RN to call report. EMS arranged.  Updated APS of pt's dc destination.  There are no other TOC needs for dc.  Final next level of care: Skilled Nursing Facility Barriers to Discharge: Barriers Resolved   Patient Goals and CMS Choice Patient states their goals for this hospitalization and ongoing recovery are:: Go to SNF CMS Medicare.gov Compare Post Acute Care list provided to:: Patient Represenative (must comment) [name] (wife)) Choice offered to / list presented to : Spouse  Discharge Placement              Patient chooses bed at: [facility] Patient to be transferred to facility by: EMS Name of family member notified: [place] Patient and family notified of of transfer: 09/11/20  Discharge Plan and Services In-house Referral: Clinical Social Work Discharge Planning Services: NA Post Acute Care Choice: [place]          DME Arranged: N/A DME Agency: NA       HH Arranged: NA [place] Agency: NA        Social Determinants of Health (SDOH) Interventions     Readmission Risk Interventions Readmission Risk Prevention Plan 09/10/2020 01/23/2019 01/22/2019  Transportation Screening Complete - Complete  PCP or Specialist Appt within 3-5 Days Not Complete Complete -  Not Complete comments Patient being referred out for SNF - -  [place] or Home Care Consult Complete Complete -  Social Work Consult for [place] Planning/Counseling Complete Complete -  Palliative  Care Screening Not Applicable - Not Applicable  Medication Review Press photographer) Complete Complete -  Some recent data might be hidden

## 2020-09-12 LAB — GLUCOSE, CAPILLARY
Glucose-Capillary: 87 mg/dL (ref 70–99)
Glucose-Capillary: 89 mg/dL (ref 70–99)
Glucose-Capillary: 97 mg/dL (ref 70–99)

## 2020-12-03 ENCOUNTER — Encounter: Payer: Self-pay | Admitting: Gastroenterology

## 2020-12-25 ENCOUNTER — Encounter: Payer: Self-pay | Admitting: Internal Medicine

## 2021-01-01 ENCOUNTER — Ambulatory Visit: Payer: Medicare HMO | Admitting: Nurse Practitioner

## 2021-01-05 ENCOUNTER — Ambulatory Visit (HOSPITAL_COMMUNITY): Payer: Medicare Other | Admitting: Hematology

## 2021-01-09 ENCOUNTER — Ambulatory Visit: Payer: Medicare Other | Admitting: Gastroenterology

## 2021-01-14 NOTE — Progress Notes (Signed)
[place] [address], [place] [address]   CLINIC:  Medical Oncology/Hematology  Patient Care Team: Hermine Messick, MD as PCP - General (Family Medicine) Herminio Commons, MD (Inactive) as PCP - Cardiology (Cardiology) Gala Romney Cristopher Estimable, MD (Gastroenterology)  CHIEF COMPLAINTS/PURPOSE OF CONSULTATION:  Evaluation of thrombocytopenia  HISTORY OF PRESENTING ILLNESS:  [name] [name] 60 y.o. male is here because of an evaluation of his thrombocytopenia, at the request of [facility].  Today he reports feeling well. Today he is accompanied by his care taker and his guardian as he is an unreliable historian. He had Hepatitis-C and it was treated. He has frequent nosebleeds; this is cause by him picking his nose. These bleeds go away on their own. His caretaker does not report blood in urine, stools, or vomit; she also reports that he has an excellent appetite. He has gained 6 lbs since 11/21/2020. He reports a history of hemorrhage of the brain as well as blood clots. He has a deformed right ankle from a previous injury. 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 He reports that he has COPD.   He smoked 1.5 ppd for 43 years, but he quit 6 months ago. He denies family history of low platelets. His brother had liver cancer, and his mother had breast and tongue cancer. His mother was a smoker. His father had brain cancer. His maternal uncle had throat cancer. His maternal grandparents also had cancer. He worked at a [employer].   MEDICAL HISTORY:  Past Medical History:  Diagnosis Date  . Anemia   . Ankle deformity, right    chronic  . Anxiety   . Arthritis   . Bipolar 1 disorder ([place])   . Blind right eye   . Blood clots in brain    per patient  . Cardiomegaly   . Cataract    Right eye  . Cerebrovascular disease    Right vertebral artery  . Cholelithiasis    Asymptomatic  . Chronic back pain    . Chronic pain   . 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 Cirrhosis ([place])    diagnosed 07/2010 when presented with first episode of hepatic encephalopathy, afp on 416/13=4.8,  U/S on 12/14/11  liver stable, pt has received 2 hep A/B vaccines  . COPD (chronic obstructive pulmonary disease) ([place])   . Dementia ([place])   . Depression   . Diabetes mellitus   . Diverticulosis   . Dyspnea   . GERD (gastroesophageal reflux disease)   . Hard of hearing   . Hepatic encephalopathy ([place])   . Hepatitis   . Hyperlipidemia   . Hypertension   . Lymphedema    R>L  . Mental retardation   . Noncompliance   . Seizures ([place])   . Thrombocytopenia ([place])     SURGICAL HISTORY: Past Surgical History:  Procedure Laterality Date  . CAST APPLICATION  03/29/6810   Procedure: MINOR CAST APPLICATION;  Surgeon: [name], MD;  Location: [facility] ORS;  Service: Orthopedics;  Laterality: Right;  no anesthesia , procedure room do not need or room !  . COLONOSCOPY  12/15/11   colonic divericulosis;poor prep, ACBE  recommended but has not been done  . COLONOSCOPY WITH PROPOFOL N/A 11/07/2017   Dr. Gala Romney: inadequate prep, internal hemorrhoids. Early interval due March 2020 due to poor prep  . ear operations    .  ESOPHAGOGASTRODUODENOSCOPY  02/02/2011   Rourk-Normal esophagus.  No varices/Question mild portal gastropathy, extrinsic compression on the antrum, lesser curvature of uncertain significance, some minimally  nodular mucosa status post biopsy, patent pylorus, normal duodenum 1 and duodenum 2.  . ESOPHAGOGASTRODUODENOSCOPY  12/15/11   Rourk-->mild changes of portal gastropathy, gastric/duodenal erosions s/p bx  (reactive changes with mild chronic inflammation. No H.pylori  . ESOPHAGOGASTRODUODENOSCOPY (EGD) WITH PROPOFOL N/A 11/07/2017   Dr. Gala Romney: normal esophagus s/p dilation, portal hypertensive gastropathy, erosive gastropathy, normal duodenum  . FOOT SURGERY    . MALONEY DILATION N/A 11/07/2017   Procedure:  Venia Minks DILATION;  Surgeon: Daneil Dolin, MD;  Location: [facility] ENDO SUITE;  Service: Endoscopy;  Laterality: N/A;  . ORIF ANKLE FRACTURE  08/27/2011   Procedure: OPEN REDUCTION INTERNAL FIXATION (ORIF) ANKLE FRACTURE;  Surgeon: [name], MD;  Location: [facility] ORS;  Service: Orthopedics;  Laterality: Right;  . TONSILLECTOMY      SOCIAL HISTORY: Social History   Socioeconomic History  . Marital status: Married    Spouse name: Not on file  . Number of children: 0  . Years of education: Not on file  . Highest education level: Not on file  Occupational History  . Occupation: disabled    Fish farm manager: UNEMPLOYED  Tobacco Use  . Smoking status: Current Every Day Smoker    Packs/day: 1.00    Years: 30.00    Pack years: 30.00    Types: Cigarettes  . Smokeless tobacco: Never Used  . Tobacco comment: only smokes about 1-2 a day  Vaping Use  . Vaping Use: Never used  Substance and Sexual Activity  . Alcohol use: No  . Drug use: No  . Sexual activity: Not on file  Other Topics Concern  . Not on file  Social History Narrative  . Not on file   Social Determinants of Health   Financial Resource Strain: Not on file  Food Insecurity: Not on file  Transportation Needs: Not on file  Physical Activity: Not on file  Stress: Not on file  Social Connections: Not on file  Intimate Partner Violence: Not on file    FAMILY HISTORY: Family History  Problem Relation Age of Onset  . Heart failure Mother   . Thyroid disease Mother   . Cancer Father   . Asthma Brother   . Heart attack Brother   . Cirrhosis Brother        EtOH  . Colon cancer Maternal Aunt     ALLERGIES:  is allergic to penicillins.  MEDICATIONS:  Current Outpatient Medications  Medication Sig Dispense Refill  . calcium gluconate 500 MG tablet Take 1 tablet by mouth 3 (three) times daily.    Marland Kitchen ezetimibe-simvastatin (VYTORIN) 10-10 MG tablet Take 1 tablet by mouth at bedtime.    . feeding supplement (ENSURE ENLIVE /  ENSURE PLUS) LIQD Take 237 mLs by mouth 2 (two) times daily between meals. 237 mL 12  . furosemide (LASIX) 20 MG tablet Take 1 tablet (20 mg total) by mouth daily. 30 tablet 1  . LACTOBACILLUS BIFIDUS PO Take by mouth.    . lactulose (CHRONULAC) 10 GM/15ML solution Take 60 mLs (40 g total) by mouth 3 (three) times daily. Titrate for 3-4 bowel motions daily. 946 mL 2  . metoprolol tartrate (LOPRESSOR) 50 MG tablet Take 50 mg by mouth 3 (three) times daily.    Marland Kitchen omeprazole (PRILOSEC) 20 MG capsule Take 20 mg by mouth daily.    . potassium chloride (KLOR-CON) 20  MEQ packet Take 20 mEq by mouth daily.    . potassium chloride SA (K-DUR) 20 MEQ tablet Take 1 tablet (20 mEq total) by mouth daily. 30 tablet 1  . QUEtiapine (SEROQUEL) 50 MG tablet Take 50 mg by mouth at bedtime.    . rifaximin (XIFAXAN) 550 MG TABS tablet Take 1 tablet (550 mg total) by mouth 2 (two) times daily. 60 tablet 3  . rifaximin (XIFAXAN) 550 MG TABS tablet Take 550 mg by mouth.    . sertraline (ZOLOFT) 25 MG tablet Take 25 mg by mouth daily.    Marland Kitchen albuterol (PROVENTIL HFA;VENTOLIN HFA) 108 (90 Base) MCG/ACT inhaler Inhale 2 puffs into the lungs every 4 (four) hours as needed for wheezing or shortness of breath. (Patient not taking: No sig reported)    . ammonium lactate (AMLACTIN) 12 % cream Apply 1 application topically daily as needed. (Patient not taking: No sig reported)    . saccharomyces boulardii (FLORASTOR) 250 MG capsule Take 1 capsule (250 mg total) by mouth 2 (two) times daily. (Patient not taking: No sig reported) 30 capsule 0  . silver sulfADIAZINE (SILVADENE) 1 % cream Apply 1 application topically daily. As directed (Patient not taking: No sig reported)    . umeclidinium-vilanterol (ANORO ELLIPTA) 62.5-25 MCG/INH AEPB Inhale 1 puff into the lungs daily. (Patient not taking: No sig reported)     No current facility-administered medications for this visit.    REVIEW OF SYSTEMS:   Review of Systems  Constitutional:  Positive for fatigue (25%). Negative for appetite change.  HENT:   Positive for nosebleeds.   Respiratory: Positive for shortness of breath (w/ exertion).   Gastrointestinal: Negative for blood in stool.  Genitourinary: Negative for hematuria.   All other systems reviewed and are negative.    PHYSICAL EXAMINATION: ECOG PERFORMANCE STATUS: 2 - Symptomatic, <50% confined to bed  Vitals:   01/15/21 1325  BP: 109/69  Pulse: 66  Resp: 18  Temp: 97.8 F (36.6 C)  SpO2: 97%   Filed Weights   01/15/21 1325  Weight: 186 lb 8 oz (84.6 kg)   Physical Exam Vitals reviewed.  Constitutional:      Appearance: Normal appearance.     Comments: In wheelchair  Cardiovascular:     Rate and Rhythm: Normal rate and regular rhythm.     Pulses: Normal pulses.     Heart sounds: Normal heart sounds.  Pulmonary:     Effort: Pulmonary effort is normal.     Breath sounds: Normal breath sounds.  Abdominal:     Palpations: Abdomen is soft. There is hepatomegaly. There is no splenomegaly or mass.     Tenderness: There is no abdominal tenderness.  Musculoskeletal:     Right lower leg: No edema.     Left lower leg: No edema.  Neurological:     General: No focal deficit present.     Mental Status: He is alert and oriented to person, place, and time.  Psychiatric:        Mood and Affect: Mood normal.        Behavior: Behavior normal.      LABORATORY DATA:  I have reviewed the data as listed Recent Results (from the past 2160 hour(s))  Ferritin     Status: None   Collection Time: 01/15/21  2:36 PM  Result Value Ref Range   Ferritin 138 24 - 336 ng/mL    Comment: Performed at [facility], [address] [place], [place] [address]  Iron and TIBC     Status: Abnormal   Collection Time: 01/15/21  2:36 PM  Result Value Ref Range   Iron 158 45 - 182 ug/dL   TIBC 224 (L) 250 - 450 ug/dL   Saturation Ratios 71 (H) 17.9 - 39.5 %   UIBC 66 ug/dL    Comment: Performed at [facility], [address], [place], [place] [address]  Folate     Status: None   Collection Time: 01/15/21  2:36 PM  Result Value Ref Range   Folate 12.5 >5.9 ng/mL    Comment: Performed at [facility], [address], [place], [place] [address]  Vitamin B12     Status: None   Collection Time: 01/15/21  2:36 PM  Result Value Ref Range   Vitamin B-12 579 180 - 914 pg/mL    Comment: (NOTE) This assay is not validated for testing neonatal or myeloproliferative syndrome specimens for Vitamin B12 levels. Performed at [facility], [address], [place], [place] [address]   Lactate dehydrogenase     Status: Abnormal   Collection Time: 01/15/21  2:36 PM  Result Value Ref Range   LDH 252 (H) 98 - 192 U/L    Comment: Performed at [facility], [address], [place], [place] [address]  Platelet by Citrate     Status: None   Collection Time: 01/15/21  2:36 PM  Result Value Ref Range   Platelet CT in Citrate EDTA platelet count consistent with citrate.     Comment: Performed at [facility], [address], [place], [place] [address]  CBC with Differential     Status: Abnormal   Collection Time: 01/15/21  2:36 PM  Result Value Ref Range   WBC 4.9 4.0 - 10.5 K/uL   RBC 3.65 (L) 4.22 - 5.81 MIL/uL   Hemoglobin 13.3 13.0 - 17.0 g/dL   HCT 39.6 39.0 - 52.0 %   MCV 108.5 (H) 80.0 - 100.0 fL   MCH 36.4 (H) 26.0 - 34.0 pg   MCHC 33.6 30.0 - 36.0 g/dL   RDW 13.3 11.5 - 15.5 %   Platelets 77 (L) 150 - 400 K/uL    Comment: SPECIMEN CHECKED FOR CLOTS Immature Platelet Fraction may be clinically indicated, consider ordering this additional test [id number] PLATELET COUNT CONFIRMED BY SMEAR    nRBC 0.0 0.0 - 0.2 %   Neutrophils Relative % 60 %   Neutro Abs 3.0 1.7 - 7.7 K/uL   Lymphocytes Relative 22 %   Lymphs Abs 1.1 0.7 - 4.0 K/uL   Monocytes Relative 11 %   Monocytes Absolute 0.5 0.1 - 1.0 K/uL   Eosinophils Relative 5 %   Eosinophils Absolute 0.2 0.0 - 0.5 K/uL   Basophils Relative 1 %   Basophils Absolute  0.1 0.0 - 0.1 K/uL   Immature Granulocytes 1 %   Abs Immature Granulocytes 0.03 0.00 - 0.07 K/uL    Comment: Performed at [facility], [address] [place], [place] [address]  Reticulocytes     Status: Abnormal   Collection Time: 01/15/21  2:37 PM  Result Value Ref Range   Retic Ct Pct 4.0 (H) 0.4 - 3.1 %   RBC. 3.56 (L) 4.22 - 5.81 MIL/uL   Retic Count, Absolute 143.1 19.0 - 186.0 K/uL   Immature Retic Fract 16.5 (H) 2.3 - 15.9 %    Comment: Performed at [facility], [address], [place], [place] [address]    RADIOGRAPHIC STUDIES: I have personally  reviewed the radiological images as listed and agreed with the findings in the report. No results found.  ASSESSMENT:  1. Thrombocytopenia: - Patient resident at [facility] in [place], evaluated for severe thrombocytopenia. - CBC on 01/09/2021 with platelet count 60.  Hemoglobin 12.1, MCV 103, white count 3.4. - CBC on 12/05/2020 with platelet count 33, hemoglobin 13.9, MCV 104, white count 4.8. - CBC on 11/20/2020 platelet count 45, hemoglobin 12.8, white count 3.1 - CBC on 09/17/2020 platelet count 43, hemoglobin 13.2, MCV 105, white count 2.6, ANC 1.2. - Review of EMR moderate shows moderate thrombocytopenia since 2011. - Prior imaging including CTAP from 2012 and ultrasound of the abdomen from 03/27/2018 shows moderate splenomegaly. - He reportedly has hep C cirrhosis, was treated for it. - Reports nosebleeds, 3 episodes in the last 1 month.  He apparently picks his nose.  No bleeding per rectum or hematemesis.  No significant B symptoms.  2.  Social/family history: - He used to work in a [employer].  He smoked for 43 years, 1 and half pack per day and quit 6 months ago. - Father had cancer of the brain.  Mother had breast cancer and tongue cancer.  Brother had cancer of the liver.  His mother siblings also had cancer.   PLAN:  1. Thrombocytopenia: -We reviewed differential diagnosis including splenomegaly contributing to  thrombocytopenia. - We will rule out nutritional deficiency causes.  We will check platelet count in blue top tube. - We will discuss results and further plan in 2 weeks with a virtual visit.  2.  Macrocytic anemia: - We will check for nutritional deficiencies.  We will check LDH and reticulocyte count. - We will also check serum protein electrophoresis. - Most likely differential includes anemia from liver disease.  3.  Leukopenia: - He has mild leukopenia with occasional neutropenia. - He is also on number of medications which could lower his white count.  Splenomegaly can also contribute to leukopenia.  He does not have recurrent infections.  All questions were answered. The patient knows to call the clinic with any problems, questions or concerns.   Derek Jack, MD 01/15/21 6:05 PM  [place] [phone]   I, Thana Ates, am acting as a scribe for Dr. Derek Jack.  I, Derek Jack MD, have reviewed the above documentation for accuracy and completeness, and I agree with the above.

## 2021-01-15 ENCOUNTER — Inpatient Hospital Stay (HOSPITAL_COMMUNITY): Payer: Medicare Other | Attending: Hematology | Admitting: Hematology

## 2021-01-15 ENCOUNTER — Other Ambulatory Visit: Payer: Self-pay

## 2021-01-15 ENCOUNTER — Inpatient Hospital Stay (HOSPITAL_COMMUNITY): Payer: Medicare Other

## 2021-01-15 VITALS — BP 109/69 | HR 66 | Temp 97.8°F | Resp 18 | Wt 186.5 lb

## 2021-01-15 DIAGNOSIS — D696 Thrombocytopenia, unspecified: Secondary | ICD-10-CM | POA: Insufficient documentation

## 2021-01-15 DIAGNOSIS — D649 Anemia, unspecified: Secondary | ICD-10-CM | POA: Insufficient documentation

## 2021-01-15 DIAGNOSIS — D72819 Decreased white blood cell count, unspecified: Secondary | ICD-10-CM | POA: Insufficient documentation

## 2021-01-15 LAB — RETICULOCYTES
Immature Retic Fract: 16.5 % — ABNORMAL HIGH (ref 2.3–15.9)
RBC.: 3.56 MIL/uL — ABNORMAL LOW (ref 4.22–5.81)
Retic Count, Absolute: 143.1 10*3/uL (ref 19.0–186.0)
Retic Ct Pct: 4 % — ABNORMAL HIGH (ref 0.4–3.1)

## 2021-01-15 LAB — CBC WITH DIFFERENTIAL/PLATELET
Abs Immature Granulocytes: 0.03 10*3/uL (ref 0.00–0.07)
Basophils Absolute: 0.1 10*3/uL (ref 0.0–0.1)
Basophils Relative: 1 %
Eosinophils Absolute: 0.2 10*3/uL (ref 0.0–0.5)
Eosinophils Relative: 5 %
HCT: 39.6 % (ref 39.0–52.0)
Hemoglobin: 13.3 g/dL (ref 13.0–17.0)
Immature Granulocytes: 1 %
Lymphocytes Relative: 22 %
Lymphs Abs: 1.1 10*3/uL (ref 0.7–4.0)
MCH: 36.4 pg — ABNORMAL HIGH (ref 26.0–34.0)
MCHC: 33.6 g/dL (ref 30.0–36.0)
MCV: 108.5 fL — ABNORMAL HIGH (ref 80.0–100.0)
Monocytes Absolute: 0.5 10*3/uL (ref 0.1–1.0)
Monocytes Relative: 11 %
Neutro Abs: 3 10*3/uL (ref 1.7–7.7)
Neutrophils Relative %: 60 %
Platelets: 77 10*3/uL — ABNORMAL LOW (ref 150–400)
RBC: 3.65 MIL/uL — ABNORMAL LOW (ref 4.22–5.81)
RDW: 13.3 % (ref 11.5–15.5)
WBC: 4.9 10*3/uL (ref 4.0–10.5)
nRBC: 0 % (ref 0.0–0.2)

## 2021-01-15 LAB — LACTATE DEHYDROGENASE: LDH: 252 U/L — ABNORMAL HIGH (ref 98–192)

## 2021-01-15 LAB — IRON AND TIBC
Iron: 158 ug/dL (ref 45–182)
Saturation Ratios: 71 % — ABNORMAL HIGH (ref 17.9–39.5)
TIBC: 224 ug/dL — ABNORMAL LOW (ref 250–450)
UIBC: 66 ug/dL

## 2021-01-15 LAB — VITAMIN B12: Vitamin B-12: 579 pg/mL (ref 180–914)

## 2021-01-15 LAB — FERRITIN: Ferritin: 138 ng/mL (ref 24–336)

## 2021-01-15 LAB — PLATELET BY CITRATE

## 2021-01-15 LAB — FOLATE: Folate: 12.5 ng/mL (ref >5.9)

## 2021-01-15 NOTE — Patient Instructions (Addendum)
[place] at [facility] Discharge Instructions  You were seen and examined today by Dr. Delton Coombes. Dr. Delton Coombes is a hematologist, meaning he specializes in blood disorders. Dr. Delton Coombes discussed your past medical history, family history of cancers/blood disorders, and the events that led to you being here today.  You were referred to Dr. Delton Coombes due to a low platelet count. Dr. Delton Coombes has recommended additional lab work to identify the reason for decreased platelet and white blood cell count.   Dr. Delton Coombes will talk with you about your lab work in 2-3 weeks.   Thank you for choosing [place] at [facility] to provide your oncology and hematology care.  To afford each patient quality time with our provider, please arrive at least 15 minutes before your scheduled appointment time.   If you have a lab appointment with the [facility] [place] please come in thru the Main Entrance and check in at the main information desk.  You need to re-schedule your appointment should you arrive 10 or more minutes late.  We strive to give you quality time with our providers, and arriving late affects you and other patients whose appointments are after yours.  Also, if you no show three or more times for appointments you may be dismissed from the clinic at the providers discretion.     Again, thank you for choosing [facility]  Our hope is that these requests will decrease the amount of time that you wait before being seen by our physicians.       _____________________________________________________________  Should you have questions after your visit to [facility], please contact our office at [phone] and follow the prompts.  Our office hours are 8:00 a.m. and 4:30 p.m. Monday - Friday.  Please note that voicemails left after 4:00 p.m. may not be returned until the following business day.  We are closed weekends and major  holidays.  You do have access to a nurse 24-7, just call the main number to the clinic [phone] and do not press any options, hold on the line and a nurse will answer the phone.    For prescription refill requests, have your pharmacy contact our office and allow 72 hours.    Due to Covid, you will need to wear a mask upon entering the hospital. If you do not have a mask, a mask will be given to you at the Main Entrance upon arrival. For doctor visits, patients may have 1 support person age 66 or older with them. For treatment visits, patients can not have anyone with them due to social distancing guidelines and our immunocompromised population.

## 2021-01-16 LAB — COPPER, SERUM: Copper: 92 ug/dL (ref 69–132)

## 2021-01-16 LAB — RHEUMATOID FACTOR: Rheumatoid fact SerPl-aCnc: <10 IU/mL (ref <14.0)

## 2021-01-20 LAB — PROTEIN ELECTROPHORESIS, SERUM
A/G Ratio: 0.7 (ref 0.7–1.7)
Albumin ELP: 2.7 g/dL — ABNORMAL LOW (ref 2.9–4.4)
Alpha-1-Globulin: 0.3 g/dL (ref 0.0–0.4)
Alpha-2-Globulin: 0.4 g/dL (ref 0.4–1.0)
Beta Globulin: 0.7 g/dL (ref 0.7–1.3)
Gamma Globulin: 2.4 g/dL — ABNORMAL HIGH (ref 0.4–1.8)
Globulin, Total: 3.8 g/dL (ref 2.2–3.9)
Total Protein ELP: 6.5 g/dL (ref 6.0–8.5)

## 2021-01-20 LAB — ANTINUCLEAR ANTIBODIES, IFA: ANA Ab, IFA: NEGATIVE

## 2021-01-21 LAB — METHYLMALONIC ACID, SERUM: Methylmalonic Acid, Quantitative: 166 nmol/L (ref 0–378)

## 2021-01-27 ENCOUNTER — Telehealth (HOSPITAL_COMMUNITY): Payer: Medicare Other | Admitting: Hematology

## 2021-02-10 ENCOUNTER — Encounter (HOSPITAL_COMMUNITY): Payer: Self-pay

## 2021-02-10 ENCOUNTER — Inpatient Hospital Stay (HOSPITAL_COMMUNITY): Payer: Medicare Other | Admitting: Hematology and Oncology

## 2021-02-10 NOTE — Progress Notes (Signed)
Attempted to reach patient at 1445 for his scheduled appointment. Informed that he was in the activities room and to reach out at a later time. Called back at 1540 to reach patient for phone visit. Again no answer. Will attempt to have patient rescheduled for phone visit tomorrow.

## 2021-02-18 NOTE — H&P (Signed)
Surgical History & Physical  Patient Name: [name] [name] DOB: April 16, 1961  Surgery: Cataract extraction with intraocular lens implant phacoemulsification; Left Eye  Surgeon: [name] MD Surgery Date:  03/02/2021 Pre-Op Date:  02/18/2021  HPI: A 72 Yr. old male patient Pt referred by Dr. Rona Ravens for cataract evaluation. The patient complains of cloudy vision, which began 2 years ago. The left eye is affected. Pt is NLP OD. The condition's severity is worsening. The complaint is associated with blurry vision. Pt has glasses are no longer helpful. Pt states he does has some eye pain and pressure OD occasionally. Pt denies any increase in floaters/flashes of light. HPI Completed by Dr. [name]  Medical History: Cataracts Band Keratopathy OD (1964) Diabetes - DM Type 2 Heart Problem High Blood Pressure Lung Problems hypersplenism Disorder Anxiety/major Depression ...  Review of Systems Negative Allergic/Immunologic Negative Cardiovascular Negative Constitutional Negative Ear, Nose, Mouth & Throat Negative Endocrine Negative Eyes Negative Gastrointestinal Negative Genitourinary Negative Hemotologic/Lymphatic Negative Integumentary Negative Musculoskeletal Negative Neurological Negative Psychiatry Negative Respiratory  Social   Never smoked   Medication Albuterol, Ammonium Lactate, Aquaphor, Magnesium, Metoprolol, Multivitamin, Omeprazole, Oxycodone, Pantoprazole, Potassium chloride, Quetiapine, Vitamin B-12, Saccharomyces boulardii, Triamcinolone Acetonide, Xiflaxan,   Sx/Procedures  None  Drug Allergies   NKDA  History & Physical: Heent:  Cataract, Left eye NECK: supple without bruits LUNGS: lungs clear to auscultation CV: regular rate and rhythm Abdomen: soft and non-tender  Impression & Plan: Assessment: 1.  CATARACT HYPERMATURE ([place]) AGE RELATED; Left Eye (H25.22) 2.  Diabetes Type 2 No retinopathy (E11.9) 3.  BLEPHARITIS; Right Upper Lid,  Right Lower Lid, Left Upper Lid, Left Lower Lid (H01.001, H01.002,H01.004,H01.005) 4.  BLINDNESS RIGHT EYE CATEGORY 5, LOW VISION LEFT EYE (H54.115) 5.  BAND KERATOPATHY; Right Eye [phone])  Plan: 1.  Cataract accounts for the patient's decreased vision. This visual impairment is not correctable with a tolerable change in glasses or contact lenses. Cataract surgery with an implantation of a new lens should significantly improve the visual and functional status of the patient. Discussed all risks, benefits, alternatives, and potential complications. Discussed the procedures and recovery. Patient desires to have surgery. A-scan ordered and performed today for intra-ocular lens calculations. The surgery will be performed in order to improve vision for driving, reading, and for eye examinations. Recommend phacoemulsification with intra-ocular lens. Recommend Dextenza for post-operative pain and inflammation. Left Eye. Mature - Vision Blue. Omidira. Malyugin ring in room. General Anesthesia.  2.  Stressed importance of blood sugar and blood pressure control, and also yearly eye examinations. Discussed the need for ongoing proactive ocular exams and treatment, hopefully before visual symptoms develop.  3.  Recommend regular lid cleaning.  4.  Monocular precautions discussed, including wearing shatterproof lenses. By history large RD OD now with blind eye.  5.  Chronic.

## 2021-02-24 NOTE — Patient Instructions (Signed)
    [name] [name]  02/24/2021     @PREFPERIOPPHARMACY @   Your procedure is scheduled on  03/02/2021.   Report to [facility] at  1230  P.M.   Call this number if you have problems the morning of surgery:  [phone]   Remember:  Do not eat or drink after midnight.      Take these medicines the morning of surgery with A SIP OF WATER      metoprolol, prilosec, zofran (if needed), xifaxan, zoloft.  Use your inhalers before you come to the hospital.  DO NOT take any medications for diabetes the morning of your procedure.      Do not wear jewelry, make-up or nail polish.  Do not wear lotions, powders, or perfumes, or deodorant.  Do not shave 48 hours prior to surgery.  Men may shave face and neck.  Do not bring valuables to the hospital.  [facility] is not responsible for any belongings or valuables.  Contacts, dentures or bridgework may not be worn into surgery.  Leave your suitcase in the car.  After surgery it may be brought to your room.  For patients admitted to the hospital, discharge time will be determined by your treatment team.  Patients discharged the day of surgery will not be allowed to drive home and must have someone with them for 24 hours.Marland Kitchen    Special instructions:   DO NOT smoke tobacco or vape for 24 hours before your procedure.  Please read over the following fact sheets that you were given. Anesthesia Post-op Instructions and Care and Recovery After Surgery

## 2021-02-24 NOTE — Pre-Procedure Instructions (Signed)
I spoke with Jani Gravel, [phone]),for Mr Fiumara to inform her that patient did not have to come for a preop appointment on 02/25/2021. He has current labs available for his surgery. I also called and spoke with Caryl Asp at [facility] [phone] to inform her of this as well. She transferred me to Amy in transportation so I could also given this information to her as well, but I had to leave a voicemail. I attempted to speak with Mr Dupler nurse but she was unavailable. I faxed a copy of Mr Nydam preop instructions to Caryl Asp, who will make sure his nurse receives these.

## 2021-02-25 ENCOUNTER — Encounter (HOSPITAL_COMMUNITY)
Admission: RE | Admit: 2021-02-25 | Discharge: 2021-02-25 | Disposition: A | Discharge status: Home / Self Care | Payer: Medicare Other | Source: Ambulatory Visit | Attending: Ophthalmology | Admitting: Ophthalmology

## 2021-02-25 ENCOUNTER — Other Ambulatory Visit: Payer: Self-pay

## 2021-03-02 ENCOUNTER — Ambulatory Visit (HOSPITAL_COMMUNITY): Payer: Medicare Other | Admitting: Anesthesiology

## 2021-03-02 ENCOUNTER — Encounter (HOSPITAL_COMMUNITY)
Admission: RE | Disposition: A | Discharge status: Skilled Nursing Facility | Payer: Self-pay | Source: Home / Self Care | Attending: Ophthalmology

## 2021-03-02 ENCOUNTER — Ambulatory Visit (HOSPITAL_COMMUNITY)
Admission: RE | Admit: 2021-03-02 | Discharge: 2021-03-02 | Disposition: A | Discharge status: Skilled Nursing Facility | Payer: Medicare Other | Attending: Ophthalmology | Admitting: Ophthalmology

## 2021-03-02 ENCOUNTER — Encounter (HOSPITAL_COMMUNITY): Payer: Self-pay | Admitting: Ophthalmology

## 2021-03-02 DIAGNOSIS — Z79899 Other long term (current) drug therapy: Secondary | ICD-10-CM | POA: Insufficient documentation

## 2021-03-02 DIAGNOSIS — H2522 Age-related cataract, morgagnian type, left eye: Secondary | ICD-10-CM | POA: Diagnosis not present

## 2021-03-02 DIAGNOSIS — Z9981 Dependence on supplemental oxygen: Secondary | ICD-10-CM | POA: Insufficient documentation

## 2021-03-02 DIAGNOSIS — E1136 Type 2 diabetes mellitus with diabetic cataract: Secondary | ICD-10-CM | POA: Diagnosis not present

## 2021-03-02 HISTORY — PX: CATARACT EXTRACTION W/PHACO: SHX586

## 2021-03-02 LAB — GLUCOSE, CAPILLARY: Glucose-Capillary: 90 mg/dL (ref 70–99)

## 2021-03-02 SURGERY — PHACOEMULSIFICATION, CATARACT, WITH IOL INSERTION
Anesthesia: General | Site: Eye | Laterality: Left

## 2021-03-02 MED ORDER — LIDOCAINE HCL (CARDIAC) PF 50 MG/5ML IV SOSY
PREFILLED_SYRINGE | INTRAVENOUS | Status: DC | PRN
  Administered 2021-03-02: 50 mg via INTRAVENOUS

## 2021-03-02 MED ORDER — FENTANYL CITRATE (PF) 100 MCG/2ML IJ SOLN
INTRAMUSCULAR | Status: DC | PRN
  Administered 2021-03-02: 25 ug via INTRAVENOUS

## 2021-03-02 MED ORDER — TRYPAN BLUE 0.06 % OP SOLN
OPHTHALMIC | Status: AC
  Filled 2021-03-02: qty 0.5

## 2021-03-02 MED ORDER — NEOMYCIN-POLYMYXIN-DEXAMETH 3.5-10000-0.1 OP SUSP
OPHTHALMIC | Status: DC | PRN
  Administered 2021-03-02: 1 [drp] via OPHTHALMIC

## 2021-03-02 MED ORDER — TRYPAN BLUE 0.06 % OP SOLN
OPHTHALMIC | Status: DC | PRN
  Administered 2021-03-02: 0.5 mL via INTRAOCULAR

## 2021-03-02 MED ORDER — SODIUM HYALURONATE 10 MG/ML IO SOLUTION
PREFILLED_SYRINGE | INTRAOCULAR | Status: DC | PRN
  Administered 2021-03-02: 0.85 mL via INTRAOCULAR

## 2021-03-02 MED ORDER — PHENYLEPHRINE-KETOROLAC 1-0.3 % IO SOLN
INTRAOCULAR | Status: AC
  Filled 2021-03-02: qty 4

## 2021-03-02 MED ORDER — SODIUM HYALURONATE 23MG/ML IO SOSY
PREFILLED_SYRINGE | INTRAOCULAR | Status: DC | PRN
  Administered 2021-03-02: 0.6 mL via INTRAOCULAR

## 2021-03-02 MED ORDER — LIDOCAINE HCL 3.5 % OP GEL
1.0000 "application " | Freq: Once | OPHTHALMIC | Status: AC
  Administered 2021-03-02: 1 via OPHTHALMIC

## 2021-03-02 MED ORDER — PROPOFOL 10 MG/ML IV BOLUS
INTRAVENOUS | Status: AC
  Filled 2021-03-02: qty 40

## 2021-03-02 MED ORDER — BSS IO SOLN
INTRAOCULAR | Status: DC | PRN
  Administered 2021-03-02: 15 mL via INTRAOCULAR

## 2021-03-02 MED ORDER — POVIDONE-IODINE 5 % OP SOLN
OPHTHALMIC | Status: DC | PRN
  Administered 2021-03-02: 1 via OPHTHALMIC

## 2021-03-02 MED ORDER — LACTATED RINGERS IV SOLN
INTRAVENOUS | Status: DC
  Administered 2021-03-02: 1000 mL via INTRAVENOUS

## 2021-03-02 MED ORDER — LACTATED RINGERS IV SOLN: INTRAVENOUS | Status: DC

## 2021-03-02 MED ORDER — STERILE WATER FOR IRRIGATION IR SOLN
Status: DC | PRN
  Administered 2021-03-02: 250 mL

## 2021-03-02 MED ORDER — PROPOFOL 10 MG/ML IV BOLUS
INTRAVENOUS | Status: DC | PRN
  Administered 2021-03-02: 40 mg via INTRAVENOUS
  Administered 2021-03-02: 20 mg via INTRAVENOUS
  Administered 2021-03-02: 30 mg via INTRAVENOUS
  Administered 2021-03-02: 40 mg via INTRAVENOUS
  Administered 2021-03-02 (×2): 50 mg via INTRAVENOUS

## 2021-03-02 MED ORDER — TROPICAMIDE 1 % OP SOLN
1.0000 [drp] | OPHTHALMIC | Status: AC
  Administered 2021-03-02 (×3): 1 [drp] via OPHTHALMIC

## 2021-03-02 MED ORDER — LIDOCAINE HCL (PF) 2 % IJ SOLN
INTRAMUSCULAR | Status: AC
  Filled 2021-03-02: qty 5

## 2021-03-02 MED ORDER — PHENYLEPHRINE-KETOROLAC 1-0.3 % IO SOLN
INTRAOCULAR | Status: DC | PRN
  Administered 2021-03-02: 500 mL via OPHTHALMIC

## 2021-03-02 MED ORDER — TETRACAINE HCL 0.5 % OP SOLN
1.0000 [drp] | OPHTHALMIC | Status: AC | PRN
  Administered 2021-03-02 (×3): 1 [drp] via OPHTHALMIC

## 2021-03-02 MED ORDER — EPINEPHRINE PF 1 MG/ML IJ SOLN
INTRAMUSCULAR | Status: AC
  Filled 2021-03-02: qty 1

## 2021-03-02 MED ORDER — LIDOCAINE HCL (PF) 1 % IJ SOLN
INTRAOCULAR | Status: DC | PRN
  Administered 2021-03-02: 1 mL via OPHTHALMIC

## 2021-03-02 MED ORDER — FENTANYL CITRATE (PF) 100 MCG/2ML IJ SOLN
INTRAMUSCULAR | Status: AC
  Filled 2021-03-02: qty 2

## 2021-03-02 MED ORDER — PHENYLEPHRINE HCL 2.5 % OP SOLN
1.0000 [drp] | OPHTHALMIC | Status: AC | PRN
  Administered 2021-03-02 (×3): 1 [drp] via OPHTHALMIC

## 2021-03-02 SURGICAL SUPPLY — 17 items
CLOTH BEACON ORANGE TIMEOUT ST (SAFETY) ×1 IMPLANT
DRAPE HALF SHEET 40X57 (DRAPES) ×1 IMPLANT
EYE SHIELD UNIVERSAL CLEAR (GAUZE/BANDAGES/DRESSINGS) ×1 IMPLANT
GLOVE SURG UNDER POLY LF SZ6.5 (GLOVE) ×1 IMPLANT
GLOVE SURG UNDER POLY LF SZ7 (GLOVE) ×1 IMPLANT
GOWN STRL REUS W/ TWL LRG LVL3 (GOWN DISPOSABLE) IMPLANT
GOWN STRL REUS W/TWL LRG LVL3 (GOWN DISPOSABLE) ×2
MARKER SKIN DUAL TIP RULER LAB (MISCELLANEOUS) ×1 IMPLANT
NDL HYPO 18GX1.5 BLUNT FILL (NEEDLE) IMPLANT
NEEDLE HYPO 18GX1.5 BLUNT FILL (NEEDLE) ×2 IMPLANT
PAD ARMBOARD 7.5X6 YLW CONV (MISCELLANEOUS) ×1 IMPLANT
PROC W SPEC LENS (INTRAOCULAR LENS)
PROCESS W SPEC LENS (INTRAOCULAR LENS) IMPLANT
TAPE SURG TRANSPORE 1 IN (GAUZE/BANDAGES/DRESSINGS) IMPLANT
TAPE SURGICAL TRANSPORE 1 IN (GAUZE/BANDAGES/DRESSINGS) ×2
TECNIS Eyhance IOL (Intraocular Lens) ×1 IMPLANT
WATER STERILE IRR 250ML POUR (IV SOLUTION) ×1 IMPLANT

## 2021-03-02 NOTE — Interval H&P Note (Signed)
History and Physical Interval Note:  03/02/2021 2:15 PM  [name] [name]  has presented today for surgery, with the diagnosis of Nuclear sclerotic cataract - Left eye.  The various methods of treatment have been discussed with the patient and family. After consideration of risks, benefits and other options for treatment, the patient has consented to  Procedure(s): CATARACT EXTRACTION PHACO AND INTRAOCULAR LENS PLACEMENT ([place]) (Left) as a surgical intervention.  The patient's history has been reviewed, patient examined, no change in status, stable for surgery.  I have reviewed the patient's chart and labs.  Questions were answered to the patient's satisfaction.     [name]

## 2021-03-02 NOTE — Anesthesia Procedure Notes (Signed)
Date/Time: 03/02/2021 2:25 PM Performed by: Vista Deck, CRNA Pre-anesthesia Checklist: Patient identified, Emergency Drugs available, Suction available, Timeout performed and Patient being monitored Patient Re-evaluated:Patient Re-evaluated prior to induction Oxygen Delivery Method: Nasal Cannula

## 2021-03-02 NOTE — Anesthesia Preprocedure Evaluation (Signed)
Anesthesia Evaluation  Patient identified by MRN, date of birth, ID band Patient awake    Reviewed: Allergy & Precautions, H&P , NPO status , Patient's Chart, lab work & pertinent test results, reviewed documented beta blocker date and time   Airway Mallampati: II  TM Distance: >3 FB Neck ROM: full    Dental no notable dental hx.    Pulmonary COPD,  COPD inhaler, Current Smoker,    Pulmonary exam normal breath sounds clear to auscultation       Cardiovascular Exercise Tolerance: Good hypertension, negative cardio ROS   Rhythm:regular Rate:Normal     Neuro/Psych Seizures -,  PSYCHIATRIC DISORDERS Anxiety Depression Bipolar Disorder Dementia    GI/Hepatic GERD  Medicated,(+) Hepatitis -  Endo/Other  negative endocrine ROSdiabetes  Renal/GU negative Renal ROS  negative genitourinary   Musculoskeletal   Abdominal   Peds  Hematology  (+) Blood dyscrasia, anemia ,   Anesthesia Other Findings   Reproductive/Obstetrics negative OB ROS                             Anesthesia Physical Anesthesia Plan  ASA: 3  Anesthesia Plan: General and General LMA   Post-op Pain Management:    Induction:   PONV Risk Score and Plan: Ondansetron  Airway Management Planned:   Additional Equipment:   Intra-op Plan:   Post-operative Plan:   Informed Consent: I have reviewed the patients History and Physical, chart, labs and discussed the procedure including the risks, benefits and alternatives for the proposed anesthesia with the patient or authorized representative who has indicated his/her understanding and acceptance.     Dental Advisory Given  Plan Discussed with: CRNA  Anesthesia Plan Comments:         Anesthesia Quick Evaluation

## 2021-03-02 NOTE — Transfer of Care (Signed)
Immediate Anesthesia Transfer of Care Note  Patient: [name] [name]  Procedure(s) Performed: CATARACT EXTRACTION PHACO AND INTRAOCULAR LENS PLACEMENT LEFT EYE (Left: Eye)  Patient Location: PACU  Anesthesia Type:General  Level of Consciousness: sedated and patient cooperative  Airway & Oxygen Therapy: Patient Spontanous Breathing and Patient connected to nasal cannula oxygen  Post-op Assessment: Report given to RN and Post -op Vital signs reviewed and stable  Post vital signs: Reviewed and stable  Last Vitals:  Vitals Value Taken Time  BP    Temp    Pulse 56 03/02/21 1455  Resp 9 03/02/21 1455  SpO2 97 % 03/02/21 1455  Vitals shown include unvalidated device data. SEE VITAL SIGN FLOW SHEET Last Pain:  Vitals:   03/02/21 1311  TempSrc: Oral  PainSc: 6       Patients Stated Pain Goal: 6 (43/32/95 [id number])  Complications: No notable events documented.

## 2021-03-02 NOTE — Op Note (Addendum)
Date of procedure: 03/02/21  Pre-operative diagnosis: Visually significant mature cataract, Left Eye (H25.22)  Post-operative diagnosis: Visually significant mature cataract, Left Eye (H25.22)  Procedure: Removal of cataract via phacoemulsification and insertion of intra-ocular lens [employer] Vision DIB00  +10.0D into the capsular bag of the Left Eye  Attending surgeon: Gerda Diss. Rameen Quinney, MD, MA  Anesthesia: MAC, Topical [place]  Complications: None  Estimated Blood Loss: <45m (minimal)  Specimens: None  Implants: As above  Indications:  Visually significant age-related cataract, Left Eye  Procedure:  The patient was seen and identified in the pre-operative area. The operative eye was identified and dilated.  The operative eye was marked.  Topical anesthesia was administered to the operative eye.     The patient was then to the operative suite and placed in the supine position. A timeout was performed confirming the patient, procedure to be performed, and all other relevant information.   The patient's face was prepped and draped in the usual fashion for intra-ocular surgery.  A lid speculum was placed into the operative eye and the surgical microscope moved into place and focused.  An inferotemporal paracentesis was created using a 20 gauge paracentesis blade. Vision Blue was used to stain the anterior capsule. Shugarcaine was injected into the anterior chamber.  Viscoelastic was injected into the anterior chamber.  A temporal clear-corneal main wound incision was created using a 2.421mmicrokeratome.  A continuous curvilinear capsulorrhexis was initiated using an irrigating cystitome and completed using capsulorrhexis forceps.  Hydrodissection and hydrodeliniation were performed.  Viscoelastic was injected into the anterior chamber.  A phacoemulsification handpiece and a chopper as a second instrument were used to remove the nucleus and epinucleus. The irrigation/aspiration handpiece  was used to remove any remaining cortical material.   The capsular bag was reinflated with viscoelastic, checked, and found to be intact.  The intraocular lens was inserted into the capsular bag.  The irrigation/aspiration handpiece was used to remove any remaining viscoelastic.  The clear corneal wound and paracentesis wounds were then hydrated and checked with Weck-Cels to be watertight.  The lid-speculum was removed.  The drape was removed.  The patient's face was cleaned with a wet and dry 4x4.   Maxitrol was instilled in the eye. A clear shield was taped over the eye. The patient was taken to the post-operative care unit in good condition, having tolerated the procedure well.  Post-Op Instructions: [facility] and will receive all other orders and instructions.

## 2021-03-02 NOTE — Anesthesia Postprocedure Evaluation (Signed)
Anesthesia Post Note  Patient: [name] [name]  Procedure(s) Performed: CATARACT EXTRACTION PHACO AND INTRAOCULAR LENS PLACEMENT LEFT EYE (Left: Eye)  Patient location during evaluation: Phase II Anesthesia Type: General Level of consciousness: awake Pain management: pain level controlled Vital Signs Assessment: post-procedure vital signs reviewed and stable Respiratory status: spontaneous breathing and respiratory function stable Cardiovascular status: blood pressure returned to baseline and stable Postop Assessment: no headache and no apparent nausea or vomiting Anesthetic complications: no Comments: Late entry   No notable events documented.   Last Vitals:  Vitals:   03/02/21 1510 03/02/21 1512  BP:  138/81  Pulse: (!) 54 62  Resp: 15 18  Temp:  36.9 C  SpO2: 95% 97%    Last Pain:  Vitals:   03/02/21 1512  TempSrc: Oral  PainSc: 0-No pain                 Louann Sjogren

## 2021-03-02 NOTE — Discharge Instructions (Addendum)
Please discharge patient when stable, will follow up today with Dr. Wrzosek at the [facility] [place] office immediately following discharge.  Leave shield in place until visit.  All paperwork with discharge instructions will be given at the office.  [facility] [place] Address:  [address]  [place], [place] [address]  

## 2021-03-03 NOTE — Progress Notes (Signed)
UNABLE TO CONTACT PATIENT - WILL RESCHEDULE

## 2021-03-04 ENCOUNTER — Inpatient Hospital Stay (HOSPITAL_BASED_OUTPATIENT_CLINIC_OR_DEPARTMENT_OTHER): Payer: Medicare Other | Admitting: Physician Assistant

## 2021-03-04 ENCOUNTER — Encounter (HOSPITAL_COMMUNITY): Payer: Self-pay

## 2021-03-04 ENCOUNTER — Other Ambulatory Visit: Payer: Self-pay

## 2021-03-04 DIAGNOSIS — D696 Thrombocytopenia, unspecified: Secondary | ICD-10-CM

## 2021-03-04 NOTE — Progress Notes (Signed)
Called patient twice to prep for virtual visit. Someone at [facility] transferred me to the social worker and left a message for her to call back. Social worker called back and is getting scheduler to call front desk to set up appointment for in person visit.

## 2021-03-05 ENCOUNTER — Encounter (HOSPITAL_COMMUNITY): Payer: Self-pay | Admitting: Ophthalmology

## 2021-03-06 ENCOUNTER — Encounter (HOSPITAL_COMMUNITY): Payer: Self-pay | Admitting: Ophthalmology

## 2021-03-27 ENCOUNTER — Other Ambulatory Visit (HOSPITAL_COMMUNITY): Payer: Self-pay

## 2021-03-27 DIAGNOSIS — D696 Thrombocytopenia, unspecified: Secondary | ICD-10-CM

## 2021-03-27 NOTE — Progress Notes (Signed)
[place], [place] [address]   CLINIC:  Medical Oncology/Hematology  PCP:  Hermine Messick, MD [place] [id number] [phone]   REASON FOR VISIT:  Follow-up for thrombocytopenia, leukopenia, and macrocytic anemia  PRIOR THERAPY: None  CURRENT THERAPY: Under work-up  INTERVAL HISTORY:  [name] [name] 60 y.o. male returns for routine follow-up of his pancytopenia.  He was seen by Dr. Delton Coombes on 01/15/2021 for initial consultation.  Patient is accompanied today by his caregiver, as he is an unreliable historian.  At today's visit, he reports feeling poorly.  No recent hospitalizations, surgeries, or changes in baseline health status.  Patient continues to have occasional nosebleeds, likely secondary to patient picking his nose.  Nosebleeds continue to resolve on their own.  He bruises easily.  No other signs or symptoms of blood loss such as gross rectal hemorrhage or melena.  He admits to fatigue (energy 25%), headaches, and intermittent chest pain (none today).  He reports that he has been seen by cardiologist and told that his chest pain is not from his heart.  He denies dyspnea on exertion.  No frequent infections, fever, chills, night sweats , or unintentional weight loss.  He has 25% energy and 100% appetite. He endorses that he is maintaining a stable weight.    REVIEW OF SYSTEMS:  Review of Systems  Constitutional:  Positive for fatigue. Negative for appetite change, chills, diaphoresis, fever and unexpected weight change.  HENT:   Positive for nosebleeds. Negative for lump/mass.   Eyes:  Negative for eye problems.  Respiratory:  Negative for cough, hemoptysis and shortness of breath.   Cardiovascular:  Negative for chest pain, leg swelling and palpitations.  Gastrointestinal:  Positive for constipation and nausea. Negative for abdominal pain, blood in stool, diarrhea and vomiting.   Genitourinary:  Negative for hematuria.   Musculoskeletal:  Positive for back pain.  Skin: Negative.   Neurological:  Positive for dizziness, headaches and numbness. Negative for light-headedness.  Hematological:  Does not bruise/bleed easily.  Psychiatric/Behavioral:  Positive for sleep disturbance.      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   Cirrhosis ([place])    diagnosed 07/2010 when presented with first episode of hepatic encephalopathy, afp on 416/13=4.8,  U/S on 12/14/11  liver stable, pt has received 2 hep A/B vaccines   COPD (chronic obstructive pulmonary disease) (HCC)    Dementia (HCC)    Depression    Diabetes mellitus    Diverticulosis    Dyspnea    GERD (gastroesophageal reflux disease)    Hard of hearing    Hepatic encephalopathy ([place])    Hepatitis    Hyperlipidemia    Hypertension    Lymphedema    R>L   Mental retardation    Noncompliance    Seizures ([place])    Thrombocytopenia ([place])    Past Surgical History:  Procedure Laterality  Date   CAST APPLICATION  0/03/6760   Procedure: MINOR CAST APPLICATION;  Surgeon: [name], MD;  Location: [facility];  Service: Orthopedics;  Laterality: Right;  no anesthesia , procedure room do not need or room !   CATARACT EXTRACTION W/PHACO Left 03/02/2021   Procedure: CATARACT EXTRACTION PHACO AND INTRAOCULAR LENS PLACEMENT LEFT EYE;  Surgeon: [name], MD;  Location: [facility];  Service: Ophthalmology;  Laterality: Left;  CDE   37.66   COLONOSCOPY  12/15/11   colonic divericulosis;poor prep, ACBE  recommended but has not been done    COLONOSCOPY WITH PROPOFOL N/A 11/07/2017   Dr. Gala Romney: inadequate prep, internal hemorrhoids. Early interval due March 2020 due to poor prep   ear operations     ESOPHAGOGASTRODUODENOSCOPY  02/02/2011   [place]k-Normal esophagus.  No varices/Question mild portal gastropathy, extrinsic compression on the antrum, lesser curvature of uncertain significance, some minimally  nodular mucosa status post biopsy, patent pylorus, normal duodenum 1 and duodenum 2.   ESOPHAGOGASTRODUODENOSCOPY  12/15/11   [place]k-->mild changes of portal gastropathy, gastric/duodenal erosions s/p bx  (reactive changes with mild chronic inflammation. No H.pylori   ESOPHAGOGASTRODUODENOSCOPY (EGD) WITH PROPOFOL N/A 11/07/2017   Dr. Gala Romney: normal esophagus s/p dilation, portal hypertensive gastropathy, erosive gastropathy, normal duodenum   FOOT SURGERY     MALONEY DILATION N/A 11/07/2017   Procedure: MALONEY DILATION;  Surgeon: Daneil Dolin, MD;  Location: [facility];  Service: Endoscopy;  Laterality: N/A;   ORIF ANKLE FRACTURE  08/27/2011   Procedure: OPEN REDUCTION INTERNAL FIXATION (ORIF) ANKLE FRACTURE;  Surgeon: [name], MD;  Location: [facility];  Service: Orthopedics;  Laterality: Right;   TONSILLECTOMY       SOCIAL HISTORY:  Social History   Socioeconomic History   Marital status: Married    Spouse name: Not on file   Number of children: 0   Years of education: Not on file   Highest education level: Not on file  Occupational History   Occupation: disabled    Employer: UNEMPLOYED  Tobacco Use   Smoking status: Every Day    Packs/day: 1.00    Years: 30.00    Pack years: 30.00    Types: Cigarettes   Smokeless tobacco: Never   Tobacco comments:    only smokes about 1-2 a day  Vaping Use   Vaping Use: Never used  Substance and Sexual Activity   Alcohol use: No   Drug use: No   Sexual activity: Not on file  Other Topics Concern   Not on file  Social History Narrative   Not on file   Social  Determinants of Health   Financial Resource Strain: Not on file  Food Insecurity: Not on file  Transportation Needs: Not on file  Physical Activity: Not on file  Stress: Not on file  Social Connections: Not on file  Intimate Partner Violence: Not on file    FAMILY HISTORY:  Family History  Problem Relation Age of Onset   Heart failure Mother    Thyroid disease Mother    Cancer Father    Asthma Brother    Heart attack Brother    Cirrhosis Brother        EtOH   Colon cancer Maternal Aunt     CURRENT MEDICATIONS:  Outpatient Encounter Medications as of 03/30/2021  Medication Sig   acetaminophen (TYLENOL) 325 MG tablet Take 650 mg by mouth every 8 (eight) hours as needed for moderate pain or fever (temperature 101 and greater).  ammonium lactate (AMLACTIN) 12 % cream Apply 1 application topically daily as needed for dry skin. Apply to right leg   furosemide (LASIX) 20 MG tablet Take 1 tablet (20 mg total) by mouth daily.   isosorbide dinitrate (ISORDIL) 10 MG tablet Take 10 mg by mouth 2 (two) times daily.   lactulose (CHRONULAC) 10 GM/15ML solution Take 60 mLs (40 g total) by mouth 3 (three) times daily. Titrate for 3-4 bowel motions daily. (Patient taking differently: Take 40 g by mouth 3 (three) times daily.)   melatonin 3 MG TABS tablet Take 6 mg by mouth at bedtime.   metoprolol tartrate (LOPRESSOR) 25 MG tablet Take 25 mg by mouth 2 (two) times daily.   omeprazole (PRILOSEC) 20 MG capsule Take 20 mg by mouth in the morning.   ondansetron (ZOFRAN) 4 MG tablet Take 4 mg by mouth every 6 (six) hours as needed for nausea or vomiting.   potassium chloride SA (K-DUR) 20 MEQ tablet Take 1 tablet (20 mEq total) by mouth daily. (Patient taking differently: Take 20 mEq by mouth in the morning.)   QUEtiapine (SEROQUEL) 50 MG tablet Take 50 mg by mouth at bedtime.   rifaximin (XIFAXAN) 550 MG TABS tablet Take 1 tablet (550 mg total) by mouth 2 (two) times daily.   sertraline (ZOLOFT) 25  MG tablet Take 25 mg by mouth daily.   No facility-administered encounter medications on file as of 03/30/2021.    ALLERGIES:  Allergies  Allergen Reactions   Penicillins Rash    Has patient had a PCN reaction causing immediate rash, facial/tongue/throat swelling, SOB or lightheadedness with hypotension: Yes Has patient had a PCN reaction causing severe rash involving mucus membranes or skin necrosis: No Has patient had a PCN reaction that required hospitalization No Has patient had a PCN reaction occurring within the last 10 years: No If all of the above answers are "NO", then may proceed with Cephalosporin use.      PHYSICAL EXAM:  ECOG PERFORMANCE STATUS: 1 - Symptomatic but completely ambulatory  There were no vitals filed for this visit. There were no vitals filed for this visit. Physical Exam Constitutional:      Appearance: Normal appearance. He is obese.  HENT:     Head: Normocephalic and atraumatic.     Ears:     Comments: Hard of hearing    Mouth/Throat:     Mouth: Mucous membranes are moist.  Eyes:     Extraocular Movements: Extraocular movements intact.     Pupils: Pupils are equal, round, and reactive to light.     Comments: Right eye corneal opacity  Cardiovascular:     Rate and Rhythm: Normal rate and regular rhythm.     Pulses: Normal pulses.     Heart sounds: Normal heart sounds.  Pulmonary:     Effort: Pulmonary effort is normal.     Breath sounds: Normal breath sounds.  Abdominal:     General: Bowel sounds are normal.     Palpations: Abdomen is soft.     Tenderness: There is no abdominal tenderness.  Musculoskeletal:        General: No swelling.     Right lower leg: No edema.     Left lower leg: No edema.  Lymphadenopathy:     Cervical: No cervical adenopathy.  Skin:    General: Skin is warm and dry.  Neurological:     General: No focal deficit present.     Mental Status: He is alert. Mental  status is at baseline.  Psychiatric:        Mood  and Affect: Mood normal.     LABORATORY DATA:  I have reviewed the labs as listed.  CBC    Component Value Date/Time   WBC 4.9 01/15/2021 1436   RBC 3.56 (L) 01/15/2021 1437   RBC 3.65 (L) 01/15/2021 1436   HGB 13.3 01/15/2021 1436   HGB 14.5 11/13/2008 0915   HCT 39.6 01/15/2021 1436   HCT 28 04/06/2012 1550   PLT 77 (L) 01/15/2021 1436   PLT 97 (L) 11/13/2008 0915   MCV 108.5 (H) 01/15/2021 1436   MCV 88.0 04/06/2012 1550   MCH 36.4 (H) 01/15/2021 1436   MCHC 33.6 01/15/2021 1436   RDW 13.3 01/15/2021 1436   RDW 13.1 11/13/2008 0915   LYMPHSABS 1.1 01/15/2021 1436   LYMPHSABS 2.3 11/13/2008 0915   MONOABS 0.5 01/15/2021 1436   MONOABS 0.4 11/13/2008 0915   EOSABS 0.2 01/15/2021 1436   EOSABS 0.2 11/13/2008 0915   BASOSABS 0.1 01/15/2021 1436   BASOSABS 0.0 11/13/2008 0915   CMP Latest Ref Rng & Units 09/10/2020 09/09/2020 09/08/2020  Glucose 70 - 99 mg/dL 101(H) 107(H) 97  BUN 8 - 23 mg/dL _0 Creatinine 0.61 - 1.24 mg/dL 0.65 0.65 0.61  Sodium 135 - 145 mmol/L 138 138 139  Potassium 3.5 - 5.1 mmol/L 3.4(L) 3.6 3.7  Chloride 98 - 111 mmol/L 109 110 111  CO2 22 - 32 mmol/L _1 Calcium 8.9 - 10.3 mg/dL 8.3(L) 8.3(L) 8.8(L)  Total Protein 6.5 - 8.1 g/dL 5.1(L) 5.2(L) 5.6(L)  Total Bilirubin 0.3 - 1.2 mg/dL 3.5(H) 3.8(H) 4.0(H)  Alkaline Phos 38 - 126 U/L 77 78 88  AST 15 - 41 U/L 96(H) 102(H) 78(H)  ALT 0 - 44 U/L _2 DIAGNOSTIC IMAGING:  I have independently reviewed the relevant imaging and discussed with the patient.  ASSESSMENT & PLAN: 1. Thrombocytopenia, moderate to severe: - Review of EMR moderate shows moderate thrombocytopenia since 2011 - platelets have been ranging from 30-60 over the past 6 months - Prior imaging including CTAP from 2012 and ultrasound of the abdomen from 03/27/2018 shows moderate splenomegaly. - He reportedly has hep C, was treated for it.  Has a known diagnosis of cirrhosis, is on Xifaxan and lactulose. - Reports  nosebleeds.  He apparently picks his nose.  No bleeding per rectum or hematemesis.  No significant B symptoms. 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  Consider bone marrow biopsy if significant deviation from baseline.  If patient has persistent severe thrombocytopenia (platelets < 30), consider splenic artery embolization.  If nosebleeds become severe, he may benefit from ENT referral.  2.  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  Macrocytosis is likely due to liver disease. - PLAN: No significant anemia at this time.  Will repeat CBC, LDH, CMP in 4 months.  3.  Leukopenia: - He has occasional mild leukopenia with neutropenia. - He is also on number of medications which could lower his white count.  Splenomegaly can also contribute to leukopenia.  He does not have recurrent infections. - Work-up above did not reveal any nutritional deficiencies - Most recent labs (03/30/2021): WBC 2.6 with ANC 1.2 - PLAN: Neutropenia secondary to cirrhosis, no indication for treatment at this time.  We will repeat CBC and RTC in 4 months.  4.  Social/family history: - Patient resides at the [facility] in [place] is also significant for history of hemorrhage of the brain as well as blood clots. He has a deformed right ankle from a previous injury. 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 He reports that he has COPD.  He has known cirrhosis of the liver. - He used to work in a [employer].  He smoked for 43 years, 1 and half pack per day and quit 6 months ago. - Father had cancer of the brain.  Mother had breast cancer and tongue cancer.  Brother had cancer of the liver.  His mother siblings also had cancer.    PLAN SUMMARY & DISPOSITION: -Labs in 4 months (CBC, CMP, LDH) - RTC in 4 months  All questions were answered. The patient knows to call the clinic with any problems, questions or concerns.  Medical decision making: Moderate  Time spent on visit: I spent 15 minutes counseling the patient face to face. The total time spent in the appointment was 25 minutes and more than 50% was on counseling.   Harriett Rush, PA-C  03/30/2021 10:22 AM

## 2021-03-30 ENCOUNTER — Inpatient Hospital Stay (HOSPITAL_COMMUNITY): Payer: 59 | Attending: Physician Assistant | Admitting: Physician Assistant

## 2021-03-30 ENCOUNTER — Other Ambulatory Visit: Payer: Self-pay

## 2021-03-30 ENCOUNTER — Inpatient Hospital Stay (HOSPITAL_COMMUNITY): Payer: 59

## 2021-03-30 VITALS — BP 142/61 | HR 58 | Temp 96.8°F | Resp 18 | Wt 195.2 lb

## 2021-03-30 DIAGNOSIS — D649 Anemia, unspecified: Secondary | ICD-10-CM | POA: Insufficient documentation

## 2021-03-30 DIAGNOSIS — D7589 Other specified diseases of blood and blood-forming organs: Secondary | ICD-10-CM | POA: Insufficient documentation

## 2021-03-30 DIAGNOSIS — D72819 Decreased white blood cell count, unspecified: Secondary | ICD-10-CM | POA: Diagnosis not present

## 2021-03-30 DIAGNOSIS — D696 Thrombocytopenia, unspecified: Secondary | ICD-10-CM | POA: Diagnosis not present

## 2021-03-30 LAB — CBC WITH DIFFERENTIAL/PLATELET
Abs Immature Granulocytes: 0.01 10*3/uL (ref 0.00–0.07)
Basophils Absolute: 0 10*3/uL (ref 0.0–0.1)
Basophils Relative: 2 %
Eosinophils Absolute: 0.2 10*3/uL (ref 0.0–0.5)
Eosinophils Relative: 8 %
HCT: 38.5 % — ABNORMAL LOW (ref 39.0–52.0)
Hemoglobin: 13 g/dL (ref 13.0–17.0)
Immature Granulocytes: 0 %
Lymphocytes Relative: 28 %
Lymphs Abs: 0.7 10*3/uL (ref 0.7–4.0)
MCH: 36.8 pg — ABNORMAL HIGH (ref 26.0–34.0)
MCHC: 33.8 g/dL (ref 30.0–36.0)
MCV: 109.1 fL — ABNORMAL HIGH (ref 80.0–100.0)
Monocytes Absolute: 0.4 10*3/uL (ref 0.1–1.0)
Monocytes Relative: 16 %
Neutro Abs: 1.2 10*3/uL — ABNORMAL LOW (ref 1.7–7.7)
Neutrophils Relative %: 46 %
Platelets: 46 10*3/uL — ABNORMAL LOW (ref 150–400)
RBC: 3.53 MIL/uL — ABNORMAL LOW (ref 4.22–5.81)
RDW: 14.3 % (ref 11.5–15.5)
WBC: 2.6 10*3/uL — ABNORMAL LOW (ref 4.0–10.5)
nRBC: 0 % (ref 0.0–0.2)

## 2021-03-30 LAB — COMPREHENSIVE METABOLIC PANEL
ALT: 21 U/L (ref 0–44)
AST: 47 U/L — ABNORMAL HIGH (ref 15–41)
Albumin: 2.9 g/dL — ABNORMAL LOW (ref 3.5–5.0)
Alkaline Phosphatase: 87 U/L (ref 38–126)
Anion gap: 6 (ref 5–15)
BUN: 7 mg/dL — ABNORMAL LOW (ref 8–23)
CO2: 25 mmol/L (ref 22–32)
Calcium: 8.8 mg/dL — ABNORMAL LOW (ref 8.9–10.3)
Chloride: 108 mmol/L (ref 98–111)
Creatinine, Ser: 0.79 mg/dL (ref 0.61–1.24)
GFR, Estimated: >60 mL/min (ref >60)
Glucose, Bld: 147 mg/dL — ABNORMAL HIGH (ref 70–99)
Potassium: 3.6 mmol/L (ref 3.5–5.1)
Sodium: 139 mmol/L (ref 135–145)
Total Bilirubin: 2.5 mg/dL — ABNORMAL HIGH (ref 0.3–1.2)
Total Protein: 6.3 g/dL — ABNORMAL LOW (ref 6.5–8.1)

## 2021-03-30 LAB — FERRITIN: Ferritin: 71 ng/mL (ref 24–336)

## 2021-03-30 LAB — IRON AND TIBC
Iron: 83 ug/dL (ref 45–182)
Saturation Ratios: 30 % (ref 17.9–39.5)
TIBC: 277 ug/dL (ref 250–450)
UIBC: 194 ug/dL

## 2021-03-30 LAB — LACTATE DEHYDROGENASE: LDH: 277 U/L — ABNORMAL HIGH (ref 98–192)

## 2021-03-30 NOTE — Patient Instructions (Signed)
[place] at [facility] Discharge Instructions  You were seen today by Tarri Abernethy PA-C for your low platelets and low white blood cells.  These abnormalities are due to your cirrhosis.  You do not need an treatment for your blood at this time, but we will continue to monitor your labs.  LABS: Return in 4 months   OTHER TESTS: None  MEDICATIONS: No changes to home medications.  FOLLOW-UP APPOINTMENT: Office visit in 4 months   Thank you for choosing [place] at [facility] to provide your oncology and hematology care.  To afford each patient quality time with our provider, please arrive at least 15 minutes before your scheduled appointment time.   If you have a lab appointment with the [facility] please come in thru the Main Entrance and check in at the main information desk.  You need to re-schedule your appointment should you arrive 10 or more minutes late.  We strive to give you quality time with our providers, and arriving late affects you and other patients whose appointments are after yours.  Also, if you no show three or more times for appointments you may be dismissed from the clinic at the providers discretion.     Again, thank you for choosing [facility].  Our hope is that these requests will decrease the amount of time that you wait before being seen by our physicians.       _____________________________________________________________  Should you have questions after your visit to [facility], please contact our office at [phone] and follow the prompts.  Our office hours are 8:00 a.m. and 4:30 p.m. Monday - Friday.  Please note that voicemails left after 4:00 p.m. may not be returned until the following business day.  We are closed weekends and major holidays.  You do have access to a nurse 24-7, just call the main number to the clinic [phone] and do not press any options, hold on the  line and a nurse will answer the phone.    For prescription refill requests, have your pharmacy contact our office and allow 72 hours.    Due to Covid, you will need to wear a mask upon entering the hospital. If you do not have a mask, a mask will be given to you at the Main Entrance upon arrival. For doctor visits, patients may have 1 support person age 43 or older with them. For treatment visits, patients can not have anyone with them due to social distancing guidelines and our immunocompromised population.

## 2021-04-29 NOTE — Progress Notes (Signed)
Referring Provider: Hermine Messick, MD Primary Care Physician:  Hermine Messick, MD Primary GI Physician: Dr. Gala Romney  Chief Complaint  Patient presents with   Cirrhosis    HPI:   [name] [name] is a 60 y.o. male presenting today for follow-up of cirrhosis.  Last seen in our office in 2019.   He has history of suspected [place] cirrhosis, complicated by hepatic encephalopathy. Prior workup included AMA positive in the past, but follow-up serologies with AMA, ASMA, ANA negative. Hep B surface antibody negative, Hep B surface antigen negative. Hep B core antibody non-reactive. Ferritin not significantly elevated, and sats 42%. Plan to follow serially. HIV negative in 2018. Hep C negative 2011.   Completed EGD March 2019 with normal esophagus s/p dilation, portal hypertensive gastropathy, erosive gastropathy, normal duodenum.  Colonoscopy with inadequate prep in 2019, overdue for repeat exam.  He is overdue for surveillance [place] and EGD.  Recently hospitalized in April 2022 at [facility] with hepatic encephalopathy.  Most recent labs August 2022 with mildly elevated AST at 47 and elevated bilirubin at 2.5 (stable/slightly improved).  Creatinine electrolytes within normal limits.  Hemoglobin 13.0, platelets 46.  Iron panel within normal limits.  INR on file from April 2022 1.28. MELD: 13   Today:  Residing at the [facility] in [place].  [name] [name], Guardian from DSS.  [name] Many, NP at [facility].   Cirrhosis: Denies confusion or mental status changes.  Guardian states mental status is at baseline.  He has not had any problems since being discharged from the hospital in April.  Currently on lactulose 60 mL 4 times a day and Xifaxan twice daily.  Reports having 7 or 8 bowel movements per day and nocturnal stools.  He does not like lactulose.  Having some [name] bowel movements makes him not want to take lactulose.  Lactulose also causes nausea at times.  No vomiting.  Overall, he is  eating very well.  Denies unintentional weight loss, swelling in abdomen or lower extremities, bright red blood per rectum, or melena. Occasional yellowing of the skin.   Per guardian, nurse practitioner at the [facility] has been very concerned about his ammonia levels.  Even though he is clinically stable/asymptomatic, ammonia level is high. Request I call to speak with NP.   Hep A/B vaccination. - Completed.  Patient continues to report that he has hepatitis C.  I explained that his hepatitis antibody was negative several years ago, but he states he has hepatitis C.  Unable to tell me who diagnosed him.  GERD well controlled. Admits to intermittent  solid food dysphagia. Reports dilation previously was helpful.   *Of note, patient is not a great historian.  He is alert and oriented x3 and seems to give a fairly reliable report; however, he frequently gets off topic.    Conversation with [name] Many, NP: I was able to speak with [name] Many, NP at the [facility] this afternoon.  States patient has been doing well since his hospital discharge in regards to mental status.  She is checking ammonia frequently as they are trying to stay at ahead of any potential encephalopathy.  They are concerned with patient being in a nursing facility, that he very well may develop significant mental status changes prior to someone noticing.  I asked if she could place orders for the nurse to check his mental status daily and check for asterixis. She states she could place these orders; however, she is not  confident this will be completed daily.  States there is high turnover at the facility and a lot of travel nurses.  Thus, she is likely to continue monitoring ammonia, but maybe less frequent.  I did explain that even if his ammonia was elevated, as long as he was at his baseline mental status without asterixis, there was no real need to make any significant medication adjustments, but I understand the  concern here.  She did tell me patient was only having 4-5 bowel movements per 24 hours rather than the 7-8+ patient reported.  When he does not take lactulose, he does not have diarrhea.  Thus, I have advised holding off on stool studies for now.  She did agree that his orders for lactulose will be decreased to 3 times daily with a goal of about 4 BMs per day.    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   Cirrhosis ([place])    diagnosed 07/2010 when presented with first episode of hepatic encephalopathy, afp on 416/13=4.8,  U/S on 12/14/11  liver stable, pt has received 2 hep A/B vaccines   COPD (chronic obstructive pulmonary disease) (HCC)    Dementia (HCC)    Depression    Diabetes mellitus    Diverticulosis    Dyspnea    GERD (gastroesophageal reflux disease)    Hard of hearing    Hepatic encephalopathy ([place])    Hepatitis    Hyperlipidemia    Hypertension    Lymphedema    R>L   Mental retardation    Noncompliance    Seizures ([place])    Thrombocytopenia ([place])     Past Surgical History:  Procedure Laterality Date   CAST APPLICATION  01/23/9527   Procedure: MINOR CAST APPLICATION;  Surgeon: [name], MD;  Location: [facility] ORS;  Service: Orthopedics;  Laterality: Right;  no anesthesia , procedure room do not need or room !   CATARACT EXTRACTION W/PHACO Left 03/02/2021   Procedure: CATARACT EXTRACTION PHACO AND INTRAOCULAR LENS PLACEMENT LEFT EYE;  Surgeon: [name], MD;  Location: [facility] ORS;  Service: Ophthalmology;  Laterality: Left;  CDE   37.66   COLONOSCOPY  12/15/11   colonic divericulosis;poor prep, ACBE  recommended  but has not been done   COLONOSCOPY WITH PROPOFOL N/A 11/07/2017   Dr. Gala Romney: inadequate prep, internal hemorrhoids. Early interval due March 2020 due to poor prep   ear operations     ESOPHAGOGASTRODUODENOSCOPY  02/02/2011   Rourk-Normal esophagus.  No varices/Question mild portal gastropathy, extrinsic compression on the antrum, lesser curvature of uncertain significance, some minimally  nodular mucosa status post biopsy, patent pylorus, normal duodenum 1 and duodenum 2.   ESOPHAGOGASTRODUODENOSCOPY  12/15/11   Rourk-->mild changes of portal gastropathy, gastric/duodenal erosions s/p bx  (reactive changes with mild chronic inflammation. No H.pylori   ESOPHAGOGASTRODUODENOSCOPY (EGD) WITH PROPOFOL N/A 11/07/2017   Dr. Gala Romney: normal esophagus s/p dilation, portal hypertensive gastropathy, erosive gastropathy, normal duodenum   FOOT SURGERY  MALONEY DILATION N/A 11/07/2017   Procedure: Venia Minks DILATION;  Surgeon: Daneil Dolin, MD;  Location: [facility];  Service: Endoscopy;  Laterality: N/A;   ORIF ANKLE FRACTURE  08/27/2011   Procedure: OPEN REDUCTION INTERNAL FIXATION (ORIF) ANKLE FRACTURE;  Surgeon: [name], MD;  Location: [facility];  Service: Orthopedics;  Laterality: Right;   TONSILLECTOMY      Current Outpatient Medications  Medication Sig Dispense Refill   acetaminophen (TYLENOL) 325 MG tablet Take 650 mg by mouth every 8 (eight) hours as needed for moderate pain or fever (temperature 101 and greater).     ammonium lactate (AMLACTIN) 12 % cream Apply 1 application topically daily as needed for dry skin. Apply to right leg     Calcium Carbonate (CALCIUM 500 PO) Take 1 tablet by mouth in the morning and at bedtime.     ezetimibe (ZETIA) 10 MG tablet Take 10 mg by mouth daily.     furosemide (LASIX) 20 MG tablet Take 1 tablet (20 mg total) by mouth daily. 30 tablet 1   gabapentin (NEURONTIN) 100 MG capsule 294m every morning and 4048mat bedtime     isosorbide dinitrate  (ISORDIL) 10 MG tablet Take 10 mg by mouth 2 (two) times daily.     lactulose (CHRONULAC) 10 GM/15ML solution Take 60 mLs (40 g total) by mouth 3 (three) times daily. Titrate for 3-4 bowel motions daily. (Patient taking differently: 6088m 4 times a day) 946 mL 2   melatonin 3 MG TABS tablet Take 6 mg by mouth at bedtime.     Menthol, Topical Analgesic, (BIOFREEZE EX) Apply topically. 3 times a day as needed     metoprolol tartrate (LOPRESSOR) 25 MG tablet Take 25 mg by mouth 2 (two) times daily.     omeprazole (PRILOSEC) 20 MG capsule Take 20 mg by mouth in the morning.     potassium chloride SA (K-DUR) 20 MEQ tablet Take 1 tablet (20 mEq total) by mouth daily. (Patient taking differently: Take 20 mEq by mouth in the morning.) 30 tablet 1   QUEtiapine (SEROQUEL) 50 MG tablet Take 50 mg by mouth at bedtime.     rifaximin (XIFAXAN) 550 MG TABS tablet Take 1 tablet (550 mg total) by mouth 2 (two) times daily. 60 tablet 3   sertraline (ZOLOFT) 25 MG tablet Take 25 mg by mouth daily.     polyethylene glycol-electrolytes (NULYTELY) 420 g solution As directed 4000 mL 0   No current facility-administered medications for this visit.    Allergies as of 04/30/2021 - Review Complete 04/30/2021  Allergen Reaction Noted   Penicillins Rash 02/02/2011    Family History  Problem Relation Age of Onset   Heart failure Mother    Thyroid disease Mother    Cancer Father    Asthma Brother    Heart attack Brother    Cirrhosis Brother        EtOH   Colon cancer Maternal Aunt     Social History   Socioeconomic History   Marital status: Married    Spouse name: Not on file   Number of children: 0   Years of education: Not on file   Highest education level: Not on file  Occupational History   Occupation: disabled    Employer: UNEMPLOYED  Tobacco Use   Smoking status: Former    Packs/day: 1.00    Years: 30.00    Pack years: 30.00    Types: Cigarettes   Smokeless tobacco: Never  Tobacco  comments:    only smokes about 1-2 a day  Vaping Use   Vaping Use: Never used  Substance and Sexual Activity   Alcohol use: No   Drug use: No   Sexual activity: Not on file  Other Topics Concern   Not on file  Social History Narrative   Not on file   Social Determinants of Health   Financial Resource Strain: Not on file  Food Insecurity: Not on file  Transportation Needs: Not on file  Physical Activity: Not on file  Stress: Not on file  Social Connections: Not on file    Review of Systems: Gen: Denies fever, chills, cold or flulike symptoms, presyncope, syncope. CV: Denies chest pain, palpitations. Resp: Denies dyspnea at rest, cough. GI: See HPI GU: Admits to occasional dysuria.  Derm: Denies rash Psych: Denies depression, anxiety,. Heme: See HPI  Physical Exam: BP 124/79   Pulse 62   Temp (!) 97.3 F (36.3 C) (Temporal)   Ht 5' 7"  (1.702 m)   Wt 203 lb 12.8 oz (92.4 kg)   BMI 31.92 kg/m  General:   Alert and oriented. No distress noted. Pleasant and cooperative.  Head:  Normocephalic and atraumatic. Eyes:  Conjuctiva clear without scleral icterus. Blind in right eye.  Ears: Decreased auditory acuity.  Has headphones with microphone to assist with this. Heart:  S1, S2 present without murmurs appreciated. Lungs:  Clear to auscultation bilaterally. No wheezes, rales, or rhonchi. No distress.  Abdomen:  +BS, soft, and non-distended.  Mild TTP in RUQ.  Moderate TTP in LLQ. No rebound or guarding. No HSM or masses noted. Msk:  Symmetrical without gross deformities. Normal posture. Extremities:  Without edema. Neurologic:  Alert and  oriented x3.  No asterixis. Psych:  Normal mood and affect.    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  He has not been seen since 2019 and also reports dysphagia, abdominal  pain, and diarrhea.  NASH Cirrhosis: Extensive serologic evaluation previously detailed in HPI that has been unrevealing.  Patient reports several times today that he has hepatitis C. Unable to get details on this and hepatitis C antibody was negative in 2011.  He has completed hepatitis A and B vaccination.  MELD 13 based on labs in April/August this year.  Cirrhosis has been complicated by hepatic encephalopathy, recently hospitalized in April 2022 at outside facility, and thrombocytopenia (platelets as low as 39).  He is currently on lactulose 60 mL 4 times daily and Xifaxan twice daily.  I was presented with numerous lab results today where ammonia has been checked just about weekly or more frequently since his hospital discharge and is fluctuating from normal range to 300s.  Per [name] [name], patient's guardian, these fluctuations are not accompanied by mental status changes, but states nurse practitioner at nursing facility is quite concerned about his ammonia levels which is why he is on significant amounts of lactulose.  Patient reports having 7-8+ BMs daily, nocturnal stools, and occasionally nausea without vomiting when taking lactulose which resulted in noncompliance at times.  I had a long discussion with [name] Many, NP (NP at [facility]) after patient left the office. I explained that we do not tend to follow ammonia levels for cirrhotic patients as it is not a great predictor of clinical status.  Usually, this is only checked in conjunction with mental status changes.  She explained her concern as monitoring/reliability of clinical support staff is lacking in the facility due to frequent turnover/travel nurses and she is not confident they would catch a mental stats change early enough which is why they continue to monitor Ammonia. This is a tough situation and I understand the concern. Ultimately, I have recommended decreasing lactulose to 60 mg 3 times daily for now to help with compliance  with the goal of titrating to about 4 bowel movements per day. [name] [name] states they are likely to continue monitoring Ammonia for now, maybe a little less frequently.    He is overdue for [facility] surveillance and surveillance EGD which we will arrange in the near future.  Last EGD in 2019 without esophageal varices, but with evidence of portal hypertensive gastropathy.    Diarrhea: Likely secondary to significant amounts of lactulose.  It was difficult to get a clear history from patient today regarding diarrhea.  Per patient, he has 7-8+ BMs daily as well as nocturnal stools.  I initially ordered stool studies as he does have risk for C. difficile with prior hospitalization and residing in a nursing facility and we are unable to stop lactulose due to history of HE; however, after further discussion with [name] Many, NP at the [facility], patient is usually only having 4-5 BMs per 24 hours and no significant diarrhea without lactulose.  We will hold off on stool studies for now.  LLQ Abdominal Pain: On exam, patient was found to have moderate TTP in the LLQ.  Unable to get good history on this. He does have diarrhea likely secondary to lactulose as discussed above, no brbpr or melena. He does not have guarding or rebound on exam. I have recommended CT A/P with contrast to rule out diverticulitis/colitis prior to colonoscopy and checking UA/urine culture. If evidence of colitis, will check stool studies.    GERD/dysphagia: GERD is well-controlled on omeprazole 20 mg daily.  Admits to intermittent solid food dysphagia.  Prior empiric esophageal dilation in 2019 was helpful.  We will repeat EGD for further evaluation and therapeutic intervention.  Colon cancer screening: Overdue for screening colonoscopy.  Last colonoscopy 2019 with inadequate prep.  He denies any issue with constipation at this time is currently having multiple bowel movements per day in the setting of lactulose.  He is also residing at the  [facility] who can help ensure colon prep is given appropriately.  Thus, I anticipate he will have adequate prep, but to err on the side of caution, will add an extra 1/2 day of clear liquids.  No alarm symptoms.   Plan:  EGD +/- dilation + colonoscopy with propofol with Dr. Gala Romney in the near future. The risks, benefits, and alternatives have been discussed with the patient and guardian in detail..  All parties state understanding and desire to proceed. Extra 1/2-day of clear liquids in light of poor prep previously though I anticipate expect will be better as he is having multiple bowel movements a lactulose and is at the [facility] who can help with prep administration. CT A/P with contrast ASAP. UA/urine culture. Hold off on stool studies unless evidence of colitis on CT. Update INR to recalculate MELD.  Recheck hepatitis C antibody. Reduce lactulose to 60 mL 3 times a day.  Titrate to about 4 bowel movements daily. Continue Xifaxan 550 mg twice daily. Monitor for mental status changes/confusion/asterixis.  No need to monitor ammonia levels routinely. 2 g sodium diet. Max 2000 mg of Tylenol daily  if needed. Continue omeprazole 20 mg daily. Eat slowly, take small bites, chew thoroughly, drink plenty of liquids throughout meals.  If something gets stuck in his esophagus and will not come up or go down, he is to proceed to the emergency room. Follow-up in the office after procedures.  Advised to call with any questions or concerns prior to next visit.    Aliene Altes, PA-C [facility] 04/30/2021

## 2021-04-30 ENCOUNTER — Other Ambulatory Visit: Payer: Self-pay

## 2021-04-30 ENCOUNTER — Encounter: Payer: Self-pay | Admitting: Gastroenterology

## 2021-04-30 ENCOUNTER — Encounter: Payer: Self-pay | Admitting: *Deleted

## 2021-04-30 ENCOUNTER — Telehealth: Payer: Self-pay | Admitting: *Deleted

## 2021-04-30 ENCOUNTER — Ambulatory Visit (INDEPENDENT_AMBULATORY_CARE_PROVIDER_SITE_OTHER): Payer: Medicare Other | Admitting: Gastroenterology

## 2021-04-30 ENCOUNTER — Other Ambulatory Visit: Payer: Self-pay | Admitting: *Deleted

## 2021-04-30 ENCOUNTER — Telehealth: Payer: Self-pay | Admitting: Gastroenterology

## 2021-04-30 VITALS — BP 124/79 | HR 62 | Temp 97.3°F | Ht 67.0 in | Wt 203.8 lb

## 2021-04-30 DIAGNOSIS — R7989 Other specified abnormal findings of blood chemistry: Secondary | ICD-10-CM

## 2021-04-30 DIAGNOSIS — R197 Diarrhea, unspecified: Secondary | ICD-10-CM | POA: Diagnosis not present

## 2021-04-30 DIAGNOSIS — R1310 Dysphagia, unspecified: Secondary | ICD-10-CM

## 2021-04-30 DIAGNOSIS — Z1211 Encounter for screening for malignant neoplasm of colon: Secondary | ICD-10-CM

## 2021-04-30 DIAGNOSIS — K219 Gastro-esophageal reflux disease without esophagitis: Secondary | ICD-10-CM

## 2021-04-30 DIAGNOSIS — R1032 Left lower quadrant pain: Secondary | ICD-10-CM | POA: Diagnosis not present

## 2021-04-30 DIAGNOSIS — K7460 Unspecified cirrhosis of liver: Secondary | ICD-10-CM | POA: Diagnosis not present

## 2021-04-30 MED ORDER — PEG 3350-KCL-NA BICARB-NACL 420 G PO SOLR: ORAL | 0 refills | Status: DC

## 2021-04-30 NOTE — Telephone Encounter (Signed)
PA approved via [facility]. Auth# [id number], DOS Jun 18, 2021 - Aug 22, 2021

## 2021-04-30 NOTE — Patient Instructions (Addendum)
We are going to check your urine and stool for any infection.  I am also updating labs to check your clotting function.  We will also recheck you for hepatitis C as you report having history of this.  We will arrange for you to have an upper endoscopy with possible stretching of your esophagus as well as a colonoscopy in the near future with Dr. Gala Romney.  Reduce lactulose to 60 mL 3 times a day.  This may be reduced further if needed.  The goal is about 4 bowel movements daily.  Continue Xifaxan 550 mg twice daily.  Monitor for mental status changes/confusion.  If this occurs, can recheck ammonia and increase lactulose.  Follow a strict low-sodium diet.  No more than 2000 mg sodium per day including all foods and liquids consumed.  No more than 2000 mg of Tylenol daily.  Continue taking omeprazole 20 mg daily.  For swallowing trouble, eat slowly, take small bites, chew thoroughly, drink plenty of liquids throughout meals. If something gets stuck in your esophagus and will not come up or go down, you should proceed to the emergency room.    We will plan to see back in the office after your procedures.  Please call with any questions or concerns prior to next visit.  Aliene Altes, PA-C [facility]

## 2021-04-30 NOTE — Telephone Encounter (Signed)
[name] [name], due to poor colonoscopy prep previously, lets add an extra 1/2 day of clear liquids to his prep.

## 2021-04-30 NOTE — Telephone Encounter (Signed)
Called [place] [facility] and was transferred to Amy's VM to leave appt details for pre-op appt.

## 2021-05-01 NOTE — Telephone Encounter (Signed)
New instructions faxed to [facility].

## 2021-05-05 ENCOUNTER — Ambulatory Visit (HOSPITAL_COMMUNITY): Payer: 59

## 2021-06-03 ENCOUNTER — Telehealth: Payer: Self-pay | Admitting: Gastroenterology

## 2021-06-03 NOTE — Telephone Encounter (Signed)
Spoke to nurse at [facility]. They can not find lab orders. I gave them the labs again and asked it they could please draw them. Nurse stated she would draw them 10/13 and fax results when available.

## 2021-06-03 NOTE — Telephone Encounter (Signed)
[name] [name], can you follow-up with patient's nursing facility to see if labs I ordered at his last OV have been completed? I ordered UA, curine culture, INR, and Hep C Ab. I have not received any results.

## 2021-06-04 LAB — PROTIME-INR: INR: 1.3 — AB (ref 0.9–1.1)

## 2021-06-04 NOTE — Telephone Encounter (Signed)
Noted  

## 2021-06-15 ENCOUNTER — Ambulatory Visit (HOSPITAL_COMMUNITY)
Admission: RE | Admit: 2021-06-15 | Discharge: 2021-06-15 | Disposition: A | Discharge status: Home / Self Care | Payer: Medicare Other | Source: Ambulatory Visit | Attending: Gastroenterology | Admitting: Gastroenterology

## 2021-06-15 ENCOUNTER — Other Ambulatory Visit: Payer: Self-pay

## 2021-06-15 DIAGNOSIS — R1310 Dysphagia, unspecified: Secondary | ICD-10-CM | POA: Insufficient documentation

## 2021-06-15 DIAGNOSIS — R197 Diarrhea, unspecified: Secondary | ICD-10-CM | POA: Insufficient documentation

## 2021-06-15 DIAGNOSIS — R1032 Left lower quadrant pain: Secondary | ICD-10-CM | POA: Diagnosis present

## 2021-06-15 LAB — POCT I-STAT CREATININE: Creatinine, Ser: 0.8 mg/dL (ref 0.61–1.24)

## 2021-06-15 MED ORDER — IOHEXOL 300 MG/ML  SOLN
100.0000 mL | Freq: Once | INTRAMUSCULAR | Status: AC | PRN
  Administered 2021-06-15: 100 mL via INTRAVENOUS

## 2021-06-15 NOTE — Patient Instructions (Signed)
Your procedure is scheduled on: 06/18/2021  Report to [facility] at    9:00 AM.  Call this number if you have problems the morning of surgery: [phone]   Remember:              Follow Directions on the letter you received from Your Physician's office regarding the Bowel Prep              No Smoking the day of Procedure :   Take these medicines the morning of surgery with A SIP OF WATER: Isosorbide, Gabapentin, Zetia, Metoprolol and zoloft   Do not wear jewelry, make-up or nail polish.    Do not bring valuables to the hospital.  Contacts, dentures or bridgework may not be worn into surgery.  .   Patients discharged the day of surgery will not be allowed to drive home.     Colonoscopy, Adult, Care After This sheet gives you information about how to care for yourself after your procedure. Your health care provider may also give you more specific instructions. If you have problems or questions, contact your health care provider. What can I expect after the procedure? After the procedure, it is common to have: A small amount of blood in your stool for 24 hours after the procedure. Some gas. Mild abdominal cramping or bloating.  Follow these instructions at home: General instructions  For the first 24 hours after the procedure: Do not drive or use machinery. Do not sign important documents. Do not drink alcohol. Do your regular daily activities at a slower pace than normal. Eat soft, easy-to-digest foods. Rest often. Take over-the-counter or prescription medicines only as told by your health care provider. It is up to you to get the results of your procedure. Ask your health care provider, or the department performing the procedure, when your results will be ready. Relieving cramping and bloating Try walking around when you have cramps or feel bloated. Apply heat to your abdomen as told by your health care provider. Use a heat source that your health care provider  recommends, such as a moist heat pack or a heating pad. Place a towel between your skin and the heat source. Leave the heat on for 20-30 minutes. Remove the heat if your skin turns bright red. This is especially important if you are unable to feel pain, heat, or cold. You may have a greater risk of getting burned. Eating and drinking Drink enough fluid to keep your urine clear or pale yellow. Resume your normal diet as instructed by your health care provider. Avoid heavy or fried foods that are hard to digest. Avoid drinking alcohol for as long as instructed by your health care provider. Contact a health care provider if: You have blood in your stool 2-3 days after the procedure. Get help right away if: You have more than a small spotting of blood in your stool. You pass large blood clots in your stool. Your abdomen is swollen. You have nausea or vomiting. You have a fever. You have increasing abdominal pain that is not relieved with medicine. This information is not intended to replace advice given to you by your health care provider. Make sure you discuss any questions you have with your health care provider. Document Released: 03/23/2004 Document Revised: 05/03/2016 Document Reviewed: 10/21/2015 Elsevier Interactive Patient Education  2018 [facility], Adult, Care After This sheet gives you information about how to care for yourself after your procedure. Your health care  provider may also give you more specific instructions. If you have problems or questions, contact your health care provider. What can I expect after the procedure? After the procedure, it is common to have: A sore throat. Mild stomach pain or discomfort. Bloating. Nausea. Follow these instructions at home:  Follow instructions from your health care provider about what to eat or drink after your procedure. Return to your normal activities as told by your health care provider. Ask your health care  provider what activities are safe for you. Take over-the-counter and prescription medicines only as told by your health care provider. If you were given a sedative during the procedure, it can affect you for several hours. Do not drive or operate machinery until your health care provider says that it is safe. Keep all follow-up visits as told by your health care provider. This is important. Contact a health care provider if you have: A sore throat that lasts longer than one day. Trouble swallowing. Get help right away if: You vomit blood or your vomit looks like coffee grounds. You have: A fever. Bloody, black, or tarry stools. A severe sore throat or you cannot swallow. Difficulty breathing. Severe pain in your chest or abdomen. Summary After the procedure, it is common to have a sore throat, mild stomach discomfort, bloating, and nausea. If you were given a sedative during the procedure, it can affect you for several hours. Do not drive or operate machinery until your health care provider says that it is safe. Follow instructions from your health care provider about what to eat or drink after your procedure. Return to your normal activities as told by your health care provider. This information is not intended to replace advice given to you by your health care provider. Make sure you discuss any questions you have with your health care provider. Document Revised: 08/07/2019 Document Reviewed: 01/09/2018 Elsevier Patient Education  2022 [employer].

## 2021-06-16 ENCOUNTER — Encounter (HOSPITAL_COMMUNITY)
Admission: RE | Admit: 2021-06-16 | Discharge: 2021-06-16 | Disposition: A | Discharge status: Home / Self Care | Payer: 59 | Source: Ambulatory Visit | Attending: Internal Medicine | Admitting: Internal Medicine

## 2021-06-16 NOTE — Pre-Procedure Instructions (Signed)
Spoke with  patients nurse at [facility]. She states his labs were drawn today and they will fax copy when the results are back. I went over preop instructions with her and she voiced understanding. Left my number for her to call back with questions. Telephone consent was obtained by myself and Augustin Schooling, RN, from [place], [place].

## 2021-06-17 ENCOUNTER — Encounter (HOSPITAL_COMMUNITY): Payer: Self-pay | Admitting: Anesthesiology

## 2021-06-18 ENCOUNTER — Telehealth: Payer: Self-pay | Admitting: Gastroenterology

## 2021-06-18 ENCOUNTER — Ambulatory Visit (HOSPITAL_COMMUNITY): Admission: RE | Admit: 2021-06-18 | Payer: Medicare Other | Source: Ambulatory Visit | Admitting: Internal Medicine

## 2021-06-18 ENCOUNTER — Encounter (HOSPITAL_COMMUNITY): Admission: RE | Payer: Self-pay | Source: Ambulatory Visit

## 2021-06-18 SURGERY — COLONOSCOPY WITH PROPOFOL
Anesthesia: Monitor Anesthesia Care

## 2021-06-18 NOTE — Telephone Encounter (Signed)
Received and reviewed blood work from [facility] dated 06/04/2021.  INR 1.28 BMP: Glucose 206 (H), calcium 9.0, creatinine 0.72, BUN 10.9, sodium 140, potassium 4.2, chloride 109. Ammonia: 131 (H) Hemoglobin A1c: 4.5 Urine analysis with 500 leuk esterase, 21-50 WBCs, 2+ bacteria. Urine culture with greater than 100,000 CFU/ML mixed gram-negative rods. 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  This was addressed at his last office visit.  I had recommended not checking ammonia unless mental status changes, but [name] Jester, NP stated she would likely continue to monitor this routinely. - Also with chronic thrombocytopenia, stable. - MELD 13 based on current INR and labs in August.  Notes included with labs from [facility]:  Recommended lactulose 60 mL 3 times daily with plans to recheck ammonia in 2 weeks.  Noted history of refusals.  He was treated with fosfomycin for UTI.   No additional recommendations at this time. Will have labs scanned into chart.

## 2021-06-18 NOTE — Progress Notes (Signed)
Attempt to call [facility] to locate pt. Nurse stated that he refused to drink the colon prep.  Procedure cancelled.  Office contacted & made aware.

## 2021-06-24 ENCOUNTER — Ambulatory Visit (HOSPITAL_COMMUNITY): Payer: Medicare Other | Admitting: Physician Assistant

## 2021-06-25 NOTE — Progress Notes (Signed)
[name], [place] [address]   CLINIC:  Medical Oncology/Hematology  PCP:  Grover Canavan, NP [place] [address] [phone]   REASON FOR VISIT:  Follow-up for chronic portal vein thrombosis and pancytopenia  PRIOR THERAPY: None  CURRENT THERAPY: Surveillance  INTERVAL HISTORY:  [name] [name] 60 y.o. male has been followed at our clinic for pancytopenia, but returns today for a new problem as referred by his GI team Aliene Altes, PA-C).  Per communication sent by Aliene Altes, PA-C, patient was recently found to have portal vein occlusion, and GI team is requesting hematology input on anticoagulation.  At today's visit, he reports feeling at his baseline.  He does have multiple chronic complaints unrelated to today's visit, further described in ROS.  Regarding his recent diagnosis of chronic portal vein thrombosis, he has not had any prior history of any thromboembolic events.  No family history of hypercoagulable disorder that he is aware of.  He does not have any history of GI bleeding that he is aware of, but he does have frequent nosebleeds as noted below.  Regarding his pancytopenia, he continues to have nosebleeds likely secondary to picking his nose.  Nosebleeds are resolving on their own without intervention, but he has an appointment with ENT later today.  He admits to easy bruising but denies petechial rash.  He remains fatigued with energy about 40%.  He does have intermittent ear infections, but this has been ongoing since childhood.  No other frequent infections.  No B symptoms such as fever, chills, night sweats, unintentional weight loss.  He has 40% energy and 100% appetite. He endorses that he is maintaining a stable weight.    REVIEW OF SYSTEMS:  Review of Systems  Constitutional:  Positive for fatigue. Negative for appetite change, chills, diaphoresis, fever and unexpected weight change.  HENT:    Negative for lump/mass and nosebleeds.   Eyes:  Negative for eye problems.  Respiratory:  Positive for shortness of breath (At baseline, COPD). Negative for cough and hemoptysis.   Cardiovascular:  Positive for chest pain (Previously evaluated by cardiology and not felt to be cardiac chest pain; no chest pain today). Negative for leg swelling and palpitations.  Gastrointestinal:  Positive for diarrhea (On lactulose) and nausea. Negative for abdominal pain, blood in stool, constipation and vomiting.  Genitourinary:  Positive for frequency. Negative for hematuria.   Skin: Negative.   Neurological:  Positive for dizziness and headaches. Negative for light-headedness.  Hematological:  Does not bruise/bleed easily.  Psychiatric/Behavioral:  Positive for depression and sleep disturbance. The patient is nervous/anxious.      PAST MEDICAL/SURGICAL HISTORY:  Past Medical History:  Diagnosis Date   Anemia    Ankle deformity, right    chronic   Anxiety    Arthritis    Bipolar 1 disorder ([place])    Blind right eye    Blood clots in brain    per patient   Cardiomegaly    Cataract    Right eye   Cerebrovascular disease    Right vertebral artery   Cholelithiasis    Asymptomatic   Chronic back pain    Chronic pain    Cirrhosis ([place])    suspected NASH   Cirrhosis ([place])    diagnosed 07/2010 when presented with first episode of hepatic encephalopathy, afp on 416/13=4.8,  U/S on 12/14/11  liver stable, pt has received 2 hep A/B vaccines   COPD (chronic obstructive  pulmonary disease) ([place])    Dementia ([place])    Depression    Diabetes mellitus    Diverticulosis    Dyspnea    GERD (gastroesophageal reflux disease)    Hard of hearing    Hepatic encephalopathy ([place])    Hepatitis    Hyperlipidemia    Hypertension    Lymphedema    R>L   Mental retardation    Noncompliance    Seizures ([place])    Thrombocytopenia ([place])    Past Surgical History:  Procedure Laterality Date   CAST APPLICATION   02/24/517   Procedure: MINOR CAST APPLICATION;  Surgeon: [name], MD;  Location: [facility];  Service: Orthopedics;  Laterality: Right;  no anesthesia , procedure room do not need or room !   CATARACT EXTRACTION W/PHACO Left 03/02/2021   Procedure: CATARACT EXTRACTION PHACO AND INTRAOCULAR LENS PLACEMENT LEFT EYE;  Surgeon: [name], MD;  Location: [facility];  Service: Ophthalmology;  Laterality: Left;  CDE   37.66   COLONOSCOPY  12/15/11   colonic divericulosis;poor prep, ACBE  recommended but has not been done   COLONOSCOPY WITH PROPOFOL N/A 11/07/2017   Dr. Gala Romney: inadequate prep, internal hemorrhoids. Early interval due March 2020 due to poor prep   ear operations     ESOPHAGOGASTRODUODENOSCOPY  02/02/2011   [place]-Normal esophagus.  No varices/Question mild portal gastropathy, extrinsic compression on the antrum, lesser curvature of uncertain significance, some minimally  nodular mucosa status post biopsy, patent pylorus, normal duodenum 1 and duodenum 2.   ESOPHAGOGASTRODUODENOSCOPY  12/15/11   [place]k-->mild changes of portal gastropathy, gastric/duodenal erosions s/p bx  (reactive changes with mild chronic inflammation. No H.pylori   ESOPHAGOGASTRODUODENOSCOPY (EGD) WITH PROPOFOL N/A 11/07/2017   Dr. Gala Romney: normal esophagus s/p dilation, portal hypertensive gastropathy, erosive gastropathy, normal duodenum   FOOT SURGERY     MALONEY DILATION N/A 11/07/2017   Procedure: MALONEY DILATION;  Surgeon: Daneil Dolin, MD;  Location: [facility];  Service: Endoscopy;  Laterality: N/A;   ORIF ANKLE FRACTURE  08/27/2011   Procedure: OPEN REDUCTION INTERNAL FIXATION (ORIF) ANKLE FRACTURE;  Surgeon: [name], MD;  Location: [facility];  Service: Orthopedics;  Laterality: Right;   TONSILLECTOMY       SOCIAL HISTORY:  Social History   Socioeconomic History   Marital status: Married    Spouse name: Not on file   Number of children: 0   Years of education: Not on file   Highest education  level: Not on file  Occupational History   Occupation: disabled    Employer: UNEMPLOYED  Tobacco Use   Smoking status: Former    Packs/day: 1.00    Years: 30.00    Pack years: 30.00    Types: Cigarettes   Smokeless tobacco: Never   Tobacco comments:    only smokes about 1-2 a day  Vaping Use   Vaping Use: Never used  Substance and Sexual Activity   Alcohol use: No   Drug use: No   Sexual activity: Not on file  Other Topics Concern   Not on file  Social History Narrative   Not on file   Social Determinants of Health   Financial Resource Strain: Not on file  Food Insecurity: Not on file  Transportation Needs: Not on file  Physical Activity: Not on file  Stress: Not on file  Social Connections: Not on file  Intimate Partner Violence: Not on file    FAMILY HISTORY:  Family History  Problem Relation Age of Onset  Heart failure Mother    Thyroid disease Mother    Cancer Father    Asthma Brother    Heart attack Brother    Cirrhosis Brother        EtOH   Colon cancer Maternal Aunt     CURRENT MEDICATIONS:  Outpatient Encounter Medications as of 06/26/2021  Medication Sig Note   acetaminophen (TYLENOL) 325 MG tablet Take 650 mg by mouth every 8 (eight) hours as needed for moderate pain or fever (temperature 101 and greater).    ammonium lactate (AMLACTIN) 12 % cream Apply 1 application topically daily as needed for dry skin. Apply to right leg    bisacodyl (DULCOLAX) 5 MG EC tablet Take 5 mg by mouth daily.    Calcium Carbonate (CALCIUM 500 PO) Take 500 mg by mouth in the morning and at bedtime.    ezetimibe (ZETIA) 10 MG tablet Take 10 mg by mouth daily.    furosemide (LASIX) 20 MG tablet Take 1 tablet (20 mg total) by mouth daily. (Patient taking differently: Take 10 mg by mouth in the morning.)    furosemide (LASIX) 40 MG tablet Take 40 mg by mouth daily. 06/11/2021: Last dose scheduled for 06/12/2021   gabapentin (NEURONTIN) 100 MG capsule Take 200-400 mg by mouth  See admin instructions. Take 200 mg by mouth in the morning and 400 mg at bedtime    isosorbide dinitrate (ISORDIL) 10 MG tablet Take 10 mg by mouth 2 (two) times daily.    lactulose (CHRONULAC) 10 GM/15ML solution Take 60 mLs (40 g total) by mouth 3 (three) times daily. Titrate for 3-4 bowel motions daily. (Patient taking differently: Take 40 g by mouth 3 (three) times daily.)    levofloxacin (LEVAQUIN) 750 MG tablet Take 750 mg by mouth daily. 06/11/2021: Should finish on 06/13/2021   melatonin 3 MG TABS tablet Take 6 mg by mouth at bedtime.    Menthol, Topical Analgesic, 4 % GEL Apply 1 application topically 3 (three) times daily as needed (pain).    metoprolol tartrate (LOPRESSOR) 25 MG tablet Take 25 mg by mouth 2 (two) times daily.    omeprazole (PRILOSEC) 20 MG capsule Take 20 mg by mouth in the morning.    polyethylene glycol-electrolytes (NULYTELY) 420 g solution As directed    potassium chloride SA (K-DUR) 20 MEQ tablet Take 1 tablet (20 mEq total) by mouth daily. (Patient taking differently: Take 20 mEq by mouth in the morning.)    QUEtiapine (SEROQUEL) 50 MG tablet Take 50 mg by mouth at bedtime.    rifaximin (XIFAXAN) 550 MG TABS tablet Take 1 tablet (550 mg total) by mouth 2 (two) times daily.    sertraline (ZOLOFT) 25 MG tablet Take 25 mg by mouth daily.    No facility-administered encounter medications on file as of 06/26/2021.    ALLERGIES:  Allergies  Allergen Reactions   Penicillins Rash    Has patient had a PCN reaction causing immediate rash, facial/tongue/throat swelling, SOB or lightheadedness with hypotension: Yes Has patient had a PCN reaction causing severe rash involving mucus membranes or skin necrosis: No Has patient had a PCN reaction that required hospitalization No Has patient had a PCN reaction occurring within the last 10 years: No If all of the above answers are "NO", then may proceed with Cephalosporin use.      PHYSICAL EXAM:  ECOG PERFORMANCE  STATUS: 1 - Symptomatic but completely ambulatory  There were no vitals filed for this visit. There were no vitals filed  for this visit. Physical Exam Constitutional:      Appearance: Normal appearance. He is obese.  HENT:     Head: Normocephalic and atraumatic.     Mouth/Throat:     Mouth: Mucous membranes are moist.  Eyes:     Extraocular Movements: Extraocular movements intact.     Pupils: Pupils are equal, round, and reactive to light.  Cardiovascular:     Rate and Rhythm: Normal rate and regular rhythm.     Pulses: Normal pulses.     Heart sounds: Normal heart sounds.  Pulmonary:     Effort: Pulmonary effort is normal.     Breath sounds: Normal breath sounds.  Abdominal:     General: Abdomen is protuberant. Bowel sounds are normal. There is distension (Mild).     Palpations: Abdomen is soft.     Tenderness: There is no abdominal tenderness.  Musculoskeletal:        General: No swelling.     Right lower leg: No edema.     Left lower leg: No edema.  Lymphadenopathy:     Cervical: No cervical adenopathy.  Skin:    General: Skin is warm and dry.  Neurological:     General: No focal deficit present.     Mental Status: He is alert and oriented to person, place, and time.  Psychiatric:        Attention and Perception: He is inattentive.        Mood and Affect: Mood normal.        Speech: Speech is tangential.        Behavior: Behavior normal.     LABORATORY DATA:  I have reviewed the labs as listed.  CBC    Component Value Date/Time   WBC 2.6 (L) 03/30/2021 0848   RBC 3.53 (L) 03/30/2021 0848   HGB 13.0 03/30/2021 0848   HGB 14.5 11/13/2008 0915   HCT 38.5 (L) 03/30/2021 0848   HCT 28 04/06/2012 1550   PLT 46 (L) 03/30/2021 0848   PLT 97 (L) 11/13/2008 0915   MCV 109.1 (H) 03/30/2021 0848   MCV 88.0 04/06/2012 1550   MCH 36.8 (H) 03/30/2021 0848   MCHC 33.8 03/30/2021 0848   RDW 14.3 03/30/2021 0848   RDW 13.1 11/13/2008 0915   LYMPHSABS 0.7 03/30/2021  0848   LYMPHSABS 2.3 11/13/2008 0915   MONOABS 0.4 03/30/2021 0848   MONOABS 0.4 11/13/2008 0915   EOSABS 0.2 03/30/2021 0848   EOSABS 0.2 11/13/2008 0915   BASOSABS 0.0 03/30/2021 0848   BASOSABS 0.0 11/13/2008 0915   CMP Latest Ref Rng & Units 06/15/2021 03/30/2021 09/10/2020  Glucose 70 - 99 mg/dL - 147(H) 101(H)  BUN 8 - 23 mg/dL - 7(L) 10  Creatinine 0.61 - 1.24 mg/dL 0.80 0.79 0.65  Sodium 135 - 145 mmol/L - 139 138  Potassium 3.5 - 5.1 mmol/L - 3.6 3.4(L)  Chloride 98 - 111 mmol/L - 108 109  CO2 22 - 32 mmol/L - 25 23  Calcium 8.9 - 10.3 mg/dL - 8.8(L) 8.3(L)  Total Protein 6.5 - 8.1 g/dL - 6.3(L) 5.1(L)  Total Bilirubin 0.3 - 1.2 mg/dL - 2.5(H) 3.5(H)  Alkaline Phos 38 - 126 U/L - 87 77  AST 15 - 41 U/L - 47(H) 96(H)  ALT 0 - 44 U/L - 21 27    DIAGNOSTIC IMAGING:  I have independently reviewed the relevant imaging and discussed with the patient.  ASSESSMENT & PLAN: 1.  Chronic portal vein thrombosis  -  Referred by GI team Aliene Altes, PA-C) on 06/17/2021 for hematology input on chronic portal vein thrombosis and whether or not patient should be anticoagulated - CT abdomen/pelvis (06/15/2021): Hepatic cirrhosis noted.  Suspected chronic occlusion of portal vein with large collateral veins in the left upper quadrant and left retroperitoneal region consistent with portal hypertension. - Patient has not had any other history of thrombotic events such as PE or DVT.   - No history of major hemorrhage, but he does have frequent epistaxis - Decision to anticoagulate should be based on weighing of risks of recurrent or worsening thrombosis versus risk of bleeding while on anticoagulation.  The goal of any anticoagulation would be to prevent recurrent thrombosis, prevent thrombus extension, and promote recanalization. - Patient has moderate risk of recurrent thrombosis and thrombus extension, given his underlying cirrhosis. - Although patient may benefit from anticoagulation due to  portal vein thrombosis in the setting of advanced cirrhosis, he has significant risk of life-threatening bleeding events due to platelets < 50 and history of erosive gastropathy.  It is currently unknown if the patient has any esophageal varices, since his last EGD was in March 2019, which did not show any esophageal varices. - PLAN:  Due to moderate to severe thrombocytopenia and risk of bleeding events, would recommend against anticoagulation at this time.  2.  Thrombocytopenia, moderate to severe: - Review of EMR moderate shows moderate thrombocytopenia since 2011 - platelets have been ranging from 30-60 over the past 6 months - Prior imaging including CTAP from 2012 and ultrasound of the abdomen from 03/27/2018 shows moderate splenomegaly. - He reportedly has hep C, was treated for it.  Has a known diagnosis of cirrhosis, is on Xifaxan and lactulose. - Reports nosebleeds.  He apparently picks his nose.  No bleeding per rectum or hematemesis.  No significant B symptoms.   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  We will repeat CBC and have patient RTC in 2 months.   - Consider bone marrow biopsy if significant deviation from baseline.  If patient has persistent  severe thrombocytopenia (platelets < 30), consider splenic artery embolization.  If nosebleeds become severe, he may benefit from ENT referral.  3.  Macrocytic anemia: - Intermittent episodes of mild anemia with persistent macrocytosis - Macrocytosis likely secondary to liver disease - Most recent CBC (01/15/2021): Hgb 13.3 with MCV 108.5 - EGD (11/07/2017): Portal hypertensive gastropathy with erosive gastropathy - Colonoscopy (11/07/2017): Internal hemorrhoids, otherwise normal colon (limited by poor prep) -Laboratory work-up reviewed (01/15/2021): Normal ferritin, iron; negative RF and ANA; normal copper, folate, methylmalonic acid, B12; SPEP negative for M spike (polyclonal increase in gamma globulin indicative of hypergammaglobulinemia which is found in a variety of disease states) - LDH elevated at 252, reticulocytes elevated at 4.0%; bilirubin elevated at 3.2 on 12/03/2020 - Patient has nosebleed secondary to picking his  nose, but denies other signs or symptoms of blood loss - Most recent labs (03/30/2021): Hgb 13.0 with MCV 109.1 - No significant anemia at this time.  Macrocytosis is likely due to liver disease. - PLAN: Labs were not checked prior to this visit, as patient returned earlier than scheduled in order to address new diagnosis of chronic portal vein thrombosis.  We will repeat CBC and have patient RTC in 2 months.     4.  Leukopenia: - He has occasional mild leukopenia with neutropenia. - He is also on number of medications which could lower his white count.  Splenomegaly can also contribute to leukopenia.  He does not have recurrent infections. - Work-up above did not reveal any nutritional deficiencies - Most recent labs (03/30/2021): WBC 2.6 with ANC 1.2 - Neutropenia secondary to cirrhosis. - PLAN: Labs were not checked prior to this visit, as patient returned earlier than scheduled in order to address new diagnosis of chronic portal vein thrombosis.  We will repeat CBC and have  patient RTC in 2 months.   - No indication for treatment at this time   5.  Social/family history: - Patient resides at the [facility] in [place] is also significant for history of hemorrhage of the brain as well as blood clots. He has a deformed right ankle from a previous injury. 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 He reports that he has COPD.  He has known cirrhosis of the liver. - He used to work in a [employer].  He smoked for 43 years, 1 and half pack per day and quit 6 months ago. - Father had cancer of the brain.  Mother had breast cancer and tongue cancer.  Brother had cancer of the liver.  His mother siblings also had cancer.   PLAN SUMMARY & DISPOSITION: -Follow-up as scheduled next month for labs and evaluation of pancytopenia  All questions were answered. The patient knows to call the clinic with any problems, questions or concerns.  Medical decision making: Moderate  Time spent on visit: I spent 25 minutes counseling the patient face to face. The total time spent in the appointment was 40 minutes and more than 50% was on counseling.   Harriett Rush, PA-C  06/26/2021 9:17 AM

## 2021-06-26 ENCOUNTER — Other Ambulatory Visit: Payer: Self-pay

## 2021-06-26 ENCOUNTER — Inpatient Hospital Stay (HOSPITAL_COMMUNITY): Payer: Medicare Other | Attending: Physician Assistant | Admitting: Physician Assistant

## 2021-06-26 VITALS — BP 137/80 | HR 64 | Temp 98.1°F | Resp 18 | Wt 207.2 lb

## 2021-06-26 DIAGNOSIS — D649 Anemia, unspecified: Secondary | ICD-10-CM | POA: Diagnosis not present

## 2021-06-26 DIAGNOSIS — D696 Thrombocytopenia, unspecified: Secondary | ICD-10-CM | POA: Diagnosis not present

## 2021-06-26 DIAGNOSIS — I81 Portal vein thrombosis: Secondary | ICD-10-CM | POA: Diagnosis not present

## 2021-06-26 DIAGNOSIS — K7460 Unspecified cirrhosis of liver: Secondary | ICD-10-CM | POA: Insufficient documentation

## 2021-06-26 DIAGNOSIS — D61818 Other pancytopenia: Secondary | ICD-10-CM | POA: Insufficient documentation

## 2021-06-26 NOTE — Patient Instructions (Signed)
[place] at [facility] Discharge Instructions  You were seen today by Tarri Abernethy PA-C for your portal vein thrombosis (blood clot in the liver).  This is a common complication of liver cirrhosis, and in some cases it is treated with anticoagulation (blood thinners).  However, in your case, we do NOT recommend blood thinners since you are already at high risk of bleeding due to your low platelets.  We will follow-up on your blood counts at your next visit in December.  LABS: Return in 1 month for repeat labs  OTHER TESTS: No other tests at this time, but you should continue to follow-up with your GI doctors for cirrhosis.  MEDICATIONS: No changes to home medications.  FOLLOW-UP APPOINTMENT: Office visit next month.   Thank you for choosing [place] at [facility] to provide your oncology and hematology care.  To afford each patient quality time with our provider, please arrive at least 15 minutes before your scheduled appointment time.   If you have a lab appointment with the [facility] please come in thru the Main Entrance and check in at the main information desk.  You need to re-schedule your appointment should you arrive 10 or more minutes late.  We strive to give you quality time with our providers, and arriving late affects you and other patients whose appointments are after yours.  Also, if you no show three or more times for appointments you may be dismissed from the clinic at the providers discretion.     Again, thank you for choosing [facility].  Our hope is that these requests will decrease the amount of time that you wait before being seen by our physicians.       _____________________________________________________________  Should you have questions after your visit to [facility], please contact our office at [phone] and follow the prompts.  Our office hours are 8:00 a.m. and 4:30  p.m. Monday - Friday.  Please note that voicemails left after 4:00 p.m. may not be returned until the following business day.  We are closed weekends and major holidays.  You do have access to a nurse 24-7, just call the main number to the clinic [phone] and do not press any options, hold on the line and a nurse will answer the phone.    For prescription refill requests, have your pharmacy contact our office and allow 72 hours.    Due to Covid, you will need to wear a mask upon entering the hospital. If you do not have a mask, a mask will be given to you at the Main Entrance upon arrival. For doctor visits, patients may have 1 support person age 73 or older with them. For treatment visits, patients can not have anyone with them due to social distancing guidelines and our immunocompromised population.

## 2021-07-23 ENCOUNTER — Other Ambulatory Visit: Payer: Self-pay

## 2021-07-23 ENCOUNTER — Non-Acute Institutional Stay: Payer: Medicare Other

## 2021-07-23 VITALS — BP 130/68 | HR 88 | Resp 18

## 2021-07-23 DIAGNOSIS — Z515 Encounter for palliative care: Secondary | ICD-10-CM
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
## 2021-07-29 NOTE — Progress Notes (Signed)
[name] [name], [place] [id number]   CLINIC:  Medical Oncology/Hematology  PCP:  Grover Canavan, NP [place] [address] [phone]   REASON FOR VISIT:  Follow-up for cirrhosis-related pancytopenia and chronic portal vein thrombosis  PRIOR THERAPY: None  CURRENT THERAPY: Observation  INTERVAL HISTORY:  [name] [name] 60 y.o. male returns for routine follow-up of his pancytopenia and portal vein thrombosis.  He was last seen by Tarri Abernethy PA-C on 03/30/2021.  Patient is accompanied today by his Education officer, museum / group home staff member.  Patient is extremely hard of hearing, and his hearing aid is not working properly today, so history today is provided by his accompanying staff member.  Patient has continued to have occasional nosebleeds related to picking his nose, but has not had any major bleeding episodes such as hematemesis, hematochezia, or melena.  No significant bruising or petechial rash.  His energy level is about at baseline.  He does continue to have occasional complaints of headaches and chest pain, although his cardiac work-up has previously been negative.  No dyspnea on exertion.  No frequent infections.  No B symptoms such as fever, chills, night sweats, unintentional weight loss.  He has 50% energy and 50% appetite. He endorses that he is maintaining a stable weight.    REVIEW OF SYSTEMS:  Review of Systems  Constitutional:  Positive for fatigue. Negative for appetite change, chills, diaphoresis, fever and unexpected weight change.  HENT:   Negative for lump/mass and nosebleeds.   Eyes:  Negative for eye problems.  Respiratory:  Positive for shortness of breath (At baseline, COPD). Negative for cough and hemoptysis.   Cardiovascular:  Positive for chest pain (Previously evaluated by cardiology and not felt to be cardiac chest pain; no chest pain today). Negative for leg swelling and palpitations.   Gastrointestinal:  Positive for abdominal pain, diarrhea (On lactulose) and nausea. Negative for blood in stool, constipation and vomiting.  Genitourinary:  Positive for frequency. Negative for hematuria.   Musculoskeletal:  Positive for arthralgias.  Skin: Negative.   Neurological:  Positive for dizziness and headaches. Negative for light-headedness.  Hematological:  Does not bruise/bleed easily.  Psychiatric/Behavioral:  Positive for depression and sleep disturbance. The patient is nervous/anxious.      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   Cirrhosis ([place])    diagnosed 07/2010 when presented with first episode of hepatic encephalopathy, afp on 416/13=4.8,  U/S on 12/14/11  liver stable, pt has received 2 hep A/B vaccines   COPD (chronic obstructive pulmonary disease) (HCC)    Dementia (HCC)    Depression    Diabetes mellitus    Diverticulosis    Dyspnea    GERD (gastroesophageal reflux disease)    Hard of hearing    Hepatic encephalopathy (HCC)    Hepatitis    Hyperlipidemia    Hypertension    Lymphedema  R>L   Mental retardation    Noncompliance    Seizures ([place])    Thrombocytopenia ([place])    Past Surgical History:  Procedure Laterality Date   CAST APPLICATION  12/24/2704   Procedure: MINOR CAST APPLICATION;  Surgeon: [name], MD;  Location: [facility];  Service: Orthopedics;  Laterality: Right;  no anesthesia , procedure room do not need or room !   CATARACT EXTRACTION W/PHACO Left 03/02/2021   Procedure: CATARACT EXTRACTION PHACO AND INTRAOCULAR LENS PLACEMENT LEFT EYE;   Surgeon: [name], MD;  Location: [facility];  Service: Ophthalmology;  Laterality: Left;  CDE   37.66   COLONOSCOPY  12/15/11   colonic divericulosis;poor prep, ACBE  recommended but has not been done   COLONOSCOPY WITH PROPOFOL N/A 11/07/2017   Dr. Gala Romney: inadequate prep, internal hemorrhoids. Early interval due March 2020 due to poor prep   ear operations     ESOPHAGOGASTRODUODENOSCOPY  02/02/2011   Rourk-Normal esophagus.  No varices/Question mild portal gastropathy, extrinsic compression on the antrum, lesser curvature of uncertain significance, some minimally  nodular mucosa status post biopsy, patent pylorus, normal duodenum 1 and duodenum 2.   ESOPHAGOGASTRODUODENOSCOPY  12/15/11   Rourk-->mild changes of portal gastropathy, gastric/duodenal erosions s/p bx  (reactive changes with mild chronic inflammation. No H.pylori   ESOPHAGOGASTRODUODENOSCOPY (EGD) WITH PROPOFOL N/A 11/07/2017   Dr. Gala Romney: normal esophagus s/p dilation, portal hypertensive gastropathy, erosive gastropathy, normal duodenum   FOOT SURGERY     MALONEY DILATION N/A 11/07/2017   Procedure: MALONEY DILATION;  Surgeon: Daneil Dolin, MD;  Location: [facility];  Service: Endoscopy;  Laterality: N/A;   ORIF ANKLE FRACTURE  08/27/2011   Procedure: OPEN REDUCTION INTERNAL FIXATION (ORIF) ANKLE FRACTURE;  Surgeon: [name], MD;  Location: [facility];  Service: Orthopedics;  Laterality: Right;   TONSILLECTOMY       SOCIAL HISTORY:  Social History   Socioeconomic History   Marital status: Married    Spouse name: Not on file   Number of children: 0   Years of education: Not on file   Highest education level: Not on file  Occupational History   Occupation: disabled    Employer: UNEMPLOYED  Tobacco Use   Smoking status: Former    Packs/day: 1.00    Years: 30.00    Pack years: 30.00    Types: Cigarettes   Smokeless tobacco: Never   Tobacco comments:    only smokes about 1-2 a day  Vaping Use   Vaping Use:  Never used  Substance and Sexual Activity   Alcohol use: No   Drug use: No   Sexual activity: Not on file  Other Topics Concern   Not on file  Social History Narrative   Not on file   Social Determinants of Health   Financial Resource Strain: Not on file  Food Insecurity: Not on file  Transportation Needs: Not on file  Physical Activity: Not on file  Stress: Not on file  Social Connections: Not on file  Intimate Partner Violence: Not on file    FAMILY HISTORY:  Family History  Problem Relation Age of Onset   Heart failure Mother    Thyroid disease Mother    Cancer Father    Asthma Brother    Heart attack Brother    Cirrhosis Brother        EtOH   Colon cancer Maternal Aunt     CURRENT MEDICATIONS:  Outpatient Encounter Medications as of 07/30/2021  Medication Sig Note  acetaminophen (TYLENOL) 325 MG tablet Take 650 mg by mouth every 8 (eight) hours as needed for moderate pain or fever (temperature 101 and greater). (Patient not taking: Reported on 06/26/2021)    ammonium lactate (AMLACTIN) 12 % cream Apply 1 application topically daily as needed for dry skin. Apply to right leg (Patient not taking: Reported on 06/26/2021)    bisacodyl (DULCOLAX) 5 MG EC tablet Take 5 mg by mouth daily.    Calcium Carbonate (CALCIUM 500 PO) Take 500 mg by mouth in the morning and at bedtime.    ciprofloxacin-dexamethasone (CIPRODEX) OTIC suspension SMARTSIG:In Ear(s)    dexamethasone (DECADRON) 0.1 % ophthalmic solution     ezetimibe (ZETIA) 10 MG tablet Take 10 mg by mouth daily.    fosfomycin (MONUROL) 3 g PACK     furosemide (LASIX) 20 MG tablet Take 1 tablet (20 mg total) by mouth daily. (Patient taking differently: Take 10 mg by mouth in the morning.)    furosemide (LASIX) 40 MG tablet Take 40 mg by mouth daily. 06/11/2021: Last dose scheduled for 06/12/2021   gabapentin (NEURONTIN) 100 MG capsule Take 200-400 mg by mouth See admin instructions. Take 200 mg by mouth in the morning and  400 mg at bedtime    ibuprofen (ADVIL) 400 MG tablet Take by mouth.    isosorbide dinitrate (ISORDIL) 10 MG tablet Take 10 mg by mouth 2 (two) times daily.    lactulose (CHRONULAC) 10 GM/15ML solution Take 60 mLs (40 g total) by mouth 3 (three) times daily. Titrate for 3-4 bowel motions daily. (Patient taking differently: Take 40 g by mouth 3 (three) times daily.)    levofloxacin (LEVAQUIN) 750 MG tablet Take 750 mg by mouth daily. 06/11/2021: Should finish on 06/13/2021   melatonin 3 MG TABS tablet Take 6 mg by mouth at bedtime.    Menthol, Topical Analgesic, 4 % GEL Apply 1 application topically 3 (three) times daily as needed (pain). (Patient not taking: Reported on 06/26/2021)    metoprolol tartrate (LOPRESSOR) 25 MG tablet Take 25 mg by mouth 2 (two) times daily.    omeprazole (PRILOSEC) 20 MG capsule Take 20 mg by mouth in the morning.    polyethylene glycol-electrolytes (NULYTELY) 420 g solution As directed    potassium chloride SA (K-DUR) 20 MEQ tablet Take 1 tablet (20 mEq total) by mouth daily. (Patient taking differently: Take 20 mEq by mouth in the morning.)    QUEtiapine (SEROQUEL) 50 MG tablet Take 50 mg by mouth at bedtime.    rifaximin (XIFAXAN) 550 MG TABS tablet Take 1 tablet (550 mg total) by mouth 2 (two) times daily.    sertraline (ZOLOFT) 25 MG tablet Take 25 mg by mouth daily.    No facility-administered encounter medications on file as of 07/30/2021.    ALLERGIES:  Allergies  Allergen Reactions   Penicillins Rash    Has patient had a PCN reaction causing immediate rash, facial/tongue/throat swelling, SOB or lightheadedness with hypotension: Yes Has patient had a PCN reaction causing severe rash involving mucus membranes or skin necrosis: No Has patient had a PCN reaction that required hospitalization No Has patient had a PCN reaction occurring within the last 10 years: No If all of the above answers are "NO", then may proceed with Cephalosporin use.      PHYSICAL  EXAM:  ECOG PERFORMANCE STATUS: 1 - Symptomatic but completely ambulatory  There were no vitals filed for this visit. There were no vitals filed for this visit. Physical Exam Constitutional:  Appearance: Normal appearance. He is obese.  HENT:     Head: Normocephalic and atraumatic.     Mouth/Throat:     Mouth: Mucous membranes are moist.  Eyes:     Extraocular Movements: Extraocular movements intact.     Pupils: Pupils are equal, round, and reactive to light.  Cardiovascular:     Rate and Rhythm: Normal rate and regular rhythm.     Pulses: Normal pulses.     Heart sounds: Normal heart sounds.  Pulmonary:     Effort: Pulmonary effort is normal.     Breath sounds: Normal breath sounds.  Abdominal:     General: Abdomen is protuberant. Bowel sounds are normal. There is distension (Mild).     Palpations: Abdomen is soft.     Tenderness: There is no abdominal tenderness.  Musculoskeletal:        General: No swelling.     Right lower leg: No edema.     Left lower leg: No edema.  Lymphadenopathy:     Cervical: No cervical adenopathy.  Skin:    General: Skin is warm and dry.  Neurological:     General: No focal deficit present.     Mental Status: He is alert and oriented to person, place, and time.  Psychiatric:        Attention and Perception: He is inattentive.        Mood and Affect: Mood normal.        Speech: Speech is tangential.        Behavior: Behavior normal.     LABORATORY DATA:  I have reviewed the labs as listed.  CBC    Component Value Date/Time   WBC 2.6 (L) 03/30/2021 0848   RBC 3.53 (L) 03/30/2021 0848   HGB 13.0 03/30/2021 0848   HGB 14.5 11/13/2008 0915   HCT 38.5 (L) 03/30/2021 0848   HCT 28 04/06/2012 1550   PLT 46 (L) 03/30/2021 0848   PLT 97 (L) 11/13/2008 0915   MCV 109.1 (H) 03/30/2021 0848   MCV 88.0 04/06/2012 1550   MCH 36.8 (H) 03/30/2021 0848   MCHC 33.8 03/30/2021 0848   RDW 14.3 03/30/2021 0848   RDW 13.1 11/13/2008 0915    LYMPHSABS 0.7 03/30/2021 0848   LYMPHSABS 2.3 11/13/2008 0915   MONOABS 0.4 03/30/2021 0848   MONOABS 0.4 11/13/2008 0915   EOSABS 0.2 03/30/2021 0848   EOSABS 0.2 11/13/2008 0915   BASOSABS 0.0 03/30/2021 0848   BASOSABS 0.0 11/13/2008 0915   CMP Latest Ref Rng & Units 06/15/2021 03/30/2021 09/10/2020  Glucose 70 - 99 mg/dL - 147(H) 101(H)  BUN 8 - 23 mg/dL - 7(L) 10  Creatinine 0.61 - 1.24 mg/dL 0.80 0.79 0.65  Sodium 135 - 145 mmol/L - 139 138  Potassium 3.5 - 5.1 mmol/L - 3.6 3.4(L)  Chloride 98 - 111 mmol/L - 108 109  CO2 22 - 32 mmol/L - 25 23  Calcium 8.9 - 10.3 mg/dL - 8.8(L) 8.3(L)  Total Protein 6.5 - 8.1 g/dL - 6.3(L) 5.1(L)  Total Bilirubin 0.3 - 1.2 mg/dL - 2.5(H) 3.5(H)  Alkaline Phos 38 - 126 U/L - 87 77  AST 15 - 41 U/L - 47(H) 96(H)  ALT 0 - 44 U/L - 21 27    DIAGNOSTIC IMAGING:  I have independently reviewed the relevant imaging and discussed with the patient.  ASSESSMENT & PLAN: 1.  Chronic portal vein thrombosis  - Referred by GI team Aliene Altes, PA-C) on 06/17/2021 for hematology  input on chronic portal vein thrombosis and whether or not patient should be anticoagulated - CT abdomen/pelvis (06/15/2021): Hepatic cirrhosis noted.  Suspected chronic occlusion of portal vein with large collateral veins in the left upper quadrant and left retroperitoneal region consistent with portal hypertension. - Patient has not had any other history of thrombotic events such as PE or DVT.   - No history of major hemorrhage, but he does have frequent epistaxis   - Decision to anticoagulate should be based on weighing of risks of recurrent or worsening thrombosis versus risk of bleeding while on anticoagulation.  The goal of any anticoagulation would be to prevent recurrent thrombosis, prevent thrombus extension, and promote recanalization. - Patient has moderate risk of recurrent thrombosis and thrombus extension, given his underlying cirrhosis. - Although patient may benefit  from anticoagulation due to portal vein thrombosis in the setting of advanced cirrhosis, he has significant risk of life-threatening bleeding events due to platelets < 50 and history of erosive gastropathy.  It is currently unknown if the patient has any esophageal varices, since his last EGD was in March 2019, which did not show any esophageal varices. - PLAN:  Due to moderate to severe thrombocytopenia and risk of bleeding events, would recommend against anticoagulation at this time.   2.  Thrombocytopenia, moderate to severe: - Review of EMR moderate shows moderate thrombocytopenia since 2011 - platelets have been ranging from 30-60 over the past 6 months - Prior imaging including CTAP from 2012 and ultrasound of the abdomen from 03/27/2018 shows moderate splenomegaly. - He reportedly has hep C, was treated for it.  Has a known diagnosis of cirrhosis, is on Xifaxan and lactulose. - Reports nosebleeds.  He apparently picks his nose.  No bleeding per rectum or hematemesis.  No significant B symptoms.     -Laboratory work-up reviewed (01/15/2021): Normal ferritin, iron; negative RF and ANA; normal copper, folate, methylmalonic acid, B12; SPEP negative for M spike (polyclonal increase in gamma globulin indicative of hypergammaglobulinemia which is found in a variety of disease states) - Repeat CBC (01/15/2021): Platelets 77, confirmed with platelet by citrate - Labs today (07/30/2021) show platelets 35, slightly decreased from usual baseline (40s to 50s) - Differential diagnosis favors thrombocytopenia secondary to liver disease and splenomegaly; unable to rule out MDS at this time, will consider bone marrow biopsy if significant deviation from baseline - PLAN: Repeat labs and RTC in 4 months - Consider bone marrow biopsy after next appointment, if persistent deviation from baseline.  If patient has persistent severe thrombocytopenia (platelets < 30), consider splenic artery embolization.  If nosebleeds  become severe, he may benefit from ENT referral.   3.  Macrocytic anemia: - Intermittent episodes of mild anemia with persistent macrocytosis secondary to liver disease - EGD (11/07/2017): Portal hypertensive gastropathy with erosive gastropathy - Colonoscopy (11/07/2017): Internal hemorrhoids, otherwise normal colon (limited by poor prep) -Laboratory work-up reviewed (01/15/2021): Normal ferritin, iron; negative RF and ANA; normal copper, folate, methylmalonic acid, B12; SPEP negative for M spike (polyclonal increase in gamma globulin indicative of hypergammaglobulinemia which is found in a variety of disease states) - LDH elevated at 252, reticulocytes elevated at 4.0%; bilirubin elevated at 3.2 on 12/03/2020 - Patient has nosebleed secondary to picking his nose, but denies other signs or symptoms of blood loss   - CBC today (07/30/2021): Hgb 13.4 with MCV 105.1.  Ferritin 38, iron saturation 38%. - No significant anemia at this time.  Macrocytosis is likely due to liver disease. - PLAN:  Repeat labs and RTC in 4 months.   4.  Leukopenia: - He has occasional mild leukopenia with neutropenia. - He is also on number of medications which could lower his white count.  Splenomegaly can also contribute to leukopenia.  He does not have recurrent infections or B symptoms.   - Work-up above did not reveal any nutritional deficiencies - CBC today (07/30/2021): WBC 2.2/ANC 1.0/lymphocyte 0.6 - Neutropenia secondary to cirrhosis. - PLAN: Repeat labs and RTC in 4 months - No indication for treatment at this time   5.  Social/family history: - Patient resides at the [facility] in [place] is also significant for history of hemorrhage of the brain as well as blood clots. He has a deformed right ankle from a previous injury. 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 He reports that he has COPD.  He has known cirrhosis of the liver. - He used  to work in a [employer].  He smoked for 43 years, 1 and half pack per day and quit 6 months ago. - Father had cancer of the brain.  Mother had breast cancer and tongue cancer.  Brother had cancer of the liver.  His mother siblings also had cancer.   PLAN SUMMARY & DISPOSITION: Labs and RTC in 4 months  All questions were answered. The patient knows to call the clinic with any problems, questions or concerns.  Medical decision making: Moderate  Time spent on visit: I spent 20 minutes counseling the patient face to face. The total time spent in the appointment was 30 minutes and more than 50% was on counseling.   Harriett Rush, PA-C  07/30/2021 10:55 AM

## 2021-07-30 ENCOUNTER — Inpatient Hospital Stay (HOSPITAL_COMMUNITY): Payer: Medicare Other | Attending: Hematology

## 2021-07-30 ENCOUNTER — Inpatient Hospital Stay (HOSPITAL_BASED_OUTPATIENT_CLINIC_OR_DEPARTMENT_OTHER): Payer: Medicare Other | Admitting: Physician Assistant

## 2021-07-30 ENCOUNTER — Other Ambulatory Visit: Payer: Self-pay

## 2021-07-30 ENCOUNTER — Ambulatory Visit (HOSPITAL_COMMUNITY): Payer: Medicare Other | Admitting: Physician Assistant

## 2021-07-30 VITALS — BP 128/62 | HR 63 | Temp 98.4°F | Resp 18

## 2021-07-30 DIAGNOSIS — D61818 Other pancytopenia: Secondary | ICD-10-CM | POA: Diagnosis present

## 2021-07-30 DIAGNOSIS — D696 Thrombocytopenia, unspecified: Secondary | ICD-10-CM | POA: Insufficient documentation

## 2021-07-30 DIAGNOSIS — D539 Nutritional anemia, unspecified: Secondary | ICD-10-CM

## 2021-07-30 DIAGNOSIS — K7460 Unspecified cirrhosis of liver: Secondary | ICD-10-CM | POA: Insufficient documentation

## 2021-07-30 DIAGNOSIS — I81 Portal vein thrombosis: Secondary | ICD-10-CM | POA: Diagnosis not present

## 2021-07-30 DIAGNOSIS — D649 Anemia, unspecified: Secondary | ICD-10-CM

## 2021-07-30 LAB — CBC WITH DIFFERENTIAL/PLATELET
Abs Immature Granulocytes: 0 10*3/uL (ref 0.00–0.07)
Basophils Absolute: 0 10*3/uL (ref 0.0–0.1)
Basophils Relative: 1 %
Eosinophils Absolute: 0.2 10*3/uL (ref 0.0–0.5)
Eosinophils Relative: 11 %
HCT: 38.8 % — ABNORMAL LOW (ref 39.0–52.0)
Hemoglobin: 13.4 g/dL (ref 13.0–17.0)
Immature Granulocytes: 0 %
Lymphocytes Relative: 26 %
Lymphs Abs: 0.6 10*3/uL — ABNORMAL LOW (ref 0.7–4.0)
MCH: 36.3 pg — ABNORMAL HIGH (ref 26.0–34.0)
MCHC: 34.5 g/dL (ref 30.0–36.0)
MCV: 105.1 fL — ABNORMAL HIGH (ref 80.0–100.0)
Monocytes Absolute: 0.4 10*3/uL (ref 0.1–1.0)
Monocytes Relative: 18 %
Neutro Abs: 1 10*3/uL — ABNORMAL LOW (ref 1.7–7.7)
Neutrophils Relative %: 44 %
Platelets: 35 10*3/uL — ABNORMAL LOW (ref 150–400)
RBC: 3.69 MIL/uL — ABNORMAL LOW (ref 4.22–5.81)
RDW: 13.8 % (ref 11.5–15.5)
WBC: 2.2 10*3/uL — ABNORMAL LOW (ref 4.0–10.5)
nRBC: 0 % (ref 0.0–0.2)

## 2021-07-30 LAB — IRON AND TIBC
Iron: 115 ug/dL (ref 45–182)
Saturation Ratios: 38 % (ref 17.9–39.5)
TIBC: 299 ug/dL (ref 250–450)
UIBC: 184 ug/dL

## 2021-07-30 LAB — COMPREHENSIVE METABOLIC PANEL
ALT: 22 U/L (ref 0–44)
AST: 51 U/L — ABNORMAL HIGH (ref 15–41)
Albumin: 3 g/dL — ABNORMAL LOW (ref 3.5–5.0)
Alkaline Phosphatase: 70 U/L (ref 38–126)
Anion gap: 5 (ref 5–15)
BUN: 10 mg/dL (ref 8–23)
CO2: 23 mmol/L (ref 22–32)
Calcium: 8.6 mg/dL — ABNORMAL LOW (ref 8.9–10.3)
Chloride: 108 mmol/L (ref 98–111)
Creatinine, Ser: 0.76 mg/dL (ref 0.61–1.24)
GFR, Estimated: >60 mL/min (ref >60)
Glucose, Bld: 159 mg/dL — ABNORMAL HIGH (ref 70–99)
Potassium: 4.1 mmol/L (ref 3.5–5.1)
Sodium: 136 mmol/L (ref 135–145)
Total Bilirubin: 3 mg/dL — ABNORMAL HIGH (ref 0.3–1.2)
Total Protein: 6.1 g/dL — ABNORMAL LOW (ref 6.5–8.1)

## 2021-07-30 LAB — LACTATE DEHYDROGENASE: LDH: 305 U/L — ABNORMAL HIGH (ref 98–192)

## 2021-07-30 LAB — FERRITIN: Ferritin: 38 ng/mL (ref 24–336)

## 2021-07-30 NOTE — Progress Notes (Signed)
Patient is unable to hear to answer questions. Patient's guardian [name] is answering questions for him.

## 2021-07-30 NOTE — Patient Instructions (Signed)
[place] at [facility] Discharge Instructions  You were seen today by Tarri Abernethy PA-C for your low platelets and low white blood cells.  These abnormalities are due to your cirrhosis.  You do not need an treatment for your blood at this time, but we will continue to monitor your labs.  LABS: Return in 4 months   OTHER TESTS: None  MEDICATIONS: No changes to home medications.  FOLLOW-UP APPOINTMENT: Office visit in 4 months   Thank you for choosing [place] at [facility] to provide your oncology and hematology care.  To afford each patient quality time with our provider, please arrive at least 15 minutes before your scheduled appointment time.   If you have a lab appointment with the [facility] please come in thru the Main Entrance and check in at the main information desk.  You need to re-schedule your appointment should you arrive 10 or more minutes late.  We strive to give you quality time with our providers, and arriving late affects you and other patients whose appointments are after yours.  Also, if you no show three or more times for appointments you may be dismissed from the clinic at the providers discretion.     Again, thank you for choosing [facility].  Our hope is that these requests will decrease the amount of time that you wait before being seen by our physicians.       _____________________________________________________________  Should you have questions after your visit to [facility], please contact our office at [phone] and follow the prompts.  Our office hours are 8:00 a.m. and 4:30 p.m. Monday - Friday.  Please note that voicemails left after 4:00 p.m. may not be returned until the following business day.  We are closed weekends and major holidays.  You do have access to a nurse 24-7, just call the main number to the clinic [phone] and do not press any options, hold on the  line and a nurse will answer the phone.    For prescription refill requests, have your pharmacy contact our office and allow 72 hours.    Due to Covid, you will need to wear a mask upon entering the hospital. If you do not have a mask, a mask will be given to you at the Main Entrance upon arrival. For doctor visits, patients may have 1 support person age 36 or older with them. For treatment visits, patients can not have anyone with them due to social distancing guidelines and our immunocompromised population.

## 2021-08-04 ENCOUNTER — Telehealth: Payer: Self-pay

## 2021-08-04 NOTE — Telephone Encounter (Signed)
Phone call placed to patient's gaurdian, [name] [name]. Mr. [name] [name] shared that patient has had a decline in condition. He accompanied the patient to his MD appointment last week and it took an assist of 3 to transfer patient. He is now only using the w/c and is no longer ambulatory. Mr. [name] [name] noted that patient displayed more confusion and was less verbal. Patient also had reported pain as a "bee sting" in his liver and pain in his ankle that he had previously fractured. Per Mr, [name] [name], goals of care include transitioning patient to hospice services when appropriate, to focus on comfort, quality of life over quantity. Will follow up with facility.

## 2021-08-06 ENCOUNTER — Non-Acute Institutional Stay: Payer: Medicare Other | Admitting: *Deleted

## 2021-08-06 ENCOUNTER — Other Ambulatory Visit: Payer: Medicare Other

## 2021-08-06 ENCOUNTER — Other Ambulatory Visit: Payer: Self-pay

## 2021-08-06 DIAGNOSIS — Z515 Encounter for palliative care: Secondary | ICD-10-CM

## 2021-08-06 NOTE — Progress Notes (Signed)
[facility] RN NOTE  PATIENT NAME: [name] [name] DOB: Jan 16, 1961 MRN: [id number]  PRIMARY CARE PROVIDER: Grover Canavan, NP  RESPONSIBLE PARTY:  Acct ID - Guarantor Home Phone Work Phone Relationship Acct Type  [id number] [name] [phone]  Self P/F     [address], [place], [place] [address]   Covid-19 Pre-screening Negative  PLAN OF CARE and INTERVENTION:  ADVANCE CARE PLANNING/GOALS OF CARE: Goal is for patient to remain at current facility.  PATIENT/CAREGIVER EDUCATION: Symptom management, pain management, safe mobility/transfers DISEASE STATUS: Joint follow-up palliative care visit made with LCSW, [name] [name] and [name] Hippo NP. Met with patient in his room at [facility] and rehab. Upon arrival, patient is sitting on the edge of the bed. He is very hard of hearing. He is able to answer questions but his thoughts are very scattered. He reports increased pain in his right knee that is causing ambulating to be more difficult. He is receiving Tylenol for this. His Tramadol is on hold due to increased confusion. He has issues at times with elevated ammonia levels from not wanting to take his Lactulose due to diarrhea. Staff reports that he spends most of his time in his room. He is able to transfer to his bedside commode, but not walking as much. Respirations are even, regular and unlabored while at rest. He is very concerned with worsening vision in his left eye. Everything he sees is blurry. 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 He says he does not know what is wrong with his left eye. Spoke with staff LPN, [name] [name] who says patient has been seen by an eye doctor and had a cataract removed and has already had a follow-up appointment. He speaks about missing his mother and sister that has passed many years ago. Provided active listening and support. Will continue to monitor.  HISTORY OF PRESENT ILLNESS: This is a 60 -year-old male with PMH that  including but not limited to cirrhosis, thrombocytopenia, bipolar, GERD, and blind in right eye. Palliative care has been asked to follow for additional support, goals of care and complex decision making.    CODE STATUS: DNR ADVANCED DIRECTIVES: Y MOST FORM: no PPS: 40%    (Duration of visit and documentation 30 minutes)   [name] [place], RN BSN

## 2021-08-22 NOTE — Progress Notes (Signed)
COMMUNITY PALLIATIVE CARE SW NOTE  PATIENT NAME: [name] [name] DOB: 09-Oct-1960 MRN: [id number]  PRIMARY CARE PROVIDER: Grover Canavan, NP  RESPONSIBLE PARTY:  Acct ID - Guarantor Home Phone Work Phone Relationship Acct Type  [id number] [name]  Self P/F     [address], [place], [place] [address]     PLAN OF CARE and INTERVENTIONS:             GOALS OF CARE/ ADVANCE CARE PLANNING:  Goal is for patient to remain in the facility.  SOCIAL/EMOTIONAL/SPIRITUAL ASSESSMENT/ INTERVENTIONS:  SW , RN- M. Howard and NP-K. Highfill completed a follow-up visit with patient at [facility]. Patient was found sitting on the edge of the bed, awake and alert. He is hard of hearing, but was engaged with the team appropriately. However, his verbalizations  outside of simple 1-3 word answers were scattered and disconnected as he rambled about past family issues. Patient report ongoing pain to his right knee, which he is taking Tylenol for. Patient has tramadol, but it is on hold due to increased confusion. 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 Patient had a follow-up appointment with an eye doctor. The team consulted with the staff, who reported that patient spends most of his time in his room. Patient is not ambulating much outside of his room. He is only transferring himself to his bedside commode. Patient desires to be able to smoke. He also shared that he misses his deceased mother and sister. Patient also desires to have visits from his other siblings. SW provided supportive presence active listening, and will follow-up with the facility SW regarding available resources/support for patient. SW provided supportive presence, active listening, redirection, and consult with staff.  PATIENT/CAREGIVER EDUCATION/ COPING:  Patient appears to be coping adequately. However he is open to ongoing counseling to address past trauma and loss.   PERSONAL EMERGENCY PLAN:  Per facility protocol. COMMUNITY RESOURCES COORDINATION/ HEALTH CARE NAVIGATION:  None. FINANCIAL/LEGAL CONCERNS/INTERVENTIONS:  None.     SOCIAL HX:  Social History   Tobacco Use   Smoking status: Former    Packs/day: 1.00    Years: 30.00    Pack years: 30.00    Types: Cigarettes   Smokeless tobacco: Never   Tobacco comments:    only smokes about 1-2 a day  Substance Use Topics   Alcohol use: No    CODE STATUS: Full Code. ADVANCED DIRECTIVES: No MOST FORM COMPLETE:  No HOSPICE EDUCATION PROVIDED: No  PPS: Patient is alert and oriented to self and situation. He is blind in his right eye. Patient is independent for personal care needs, but needs some cueing.    Duration of visit and documentation: 60 minutes.   [address] [place], [place]

## 2021-09-03 ENCOUNTER — Telehealth: Payer: Self-pay | Admitting: *Deleted

## 2021-09-03 NOTE — Telephone Encounter (Signed)
LATE ENTRY 09/02/21 Received a communication today that Barbra Sarks called [facility] and requested an update regarding the last visit that was made to patient by [place] on 08/06/21. I called the facility-[facility] and was transferred to Alice's extention. Left Alice a voicemail providing requested update and advised that she could call back if she needed any additional information. Contact information given for return call if needed.

## 2021-11-13 ENCOUNTER — Encounter: Payer: Self-pay | Admitting: Family Medicine

## 2021-11-13 ENCOUNTER — Non-Acute Institutional Stay: Payer: Medicare Other | Admitting: Family Medicine

## 2021-11-13 VITALS — BP 102/80 | HR 52 | Temp 97.6°F | Resp 20 | Wt 198.0 lb

## 2021-11-13 DIAGNOSIS — D61818 Other pancytopenia: Secondary | ICD-10-CM

## 2021-11-13 DIAGNOSIS — Z515 Encounter for palliative care: Secondary | ICD-10-CM

## 2021-11-13 DIAGNOSIS — E7220 Disorder of urea cycle metabolism, unspecified: Secondary | ICD-10-CM

## 2021-11-13 NOTE — Progress Notes (Addendum)
? ? ?Manufacturing engineer ?Community Palliative Care Consult Note ?Telephone: [phone]  ?Fax: [phone]  ? ? ?Date of encounter: 11/13/21 ?10:45 AM ?PATIENT NAME: [name] [name] ?[address] ?[place] [address]   ?[phone] (home)  ?DOB: [birth year] ?MRN: [id number] ?PRIMARY CARE PROVIDER:    ?Grover Canavan, NP,  ?[place] ?[place] [address] ?[phone] ? ?REFERRING PROVIDER:   ?Grover Canavan, NP ?[place],  [place] [address] ?[phone] ? ?RESPONSIBLE PARTY:    ?Contact Information   ? ? Name Relation Home Work Mobile  ? [place] [phone]  ? ?  ? ? ? ?I met face to face with patient in his skilled nursing facility. Palliative Care was asked to follow this patient by consultation request of  Ejindu, Brunetta Genera, NP to address advance care planning and complex medical decision making. This is a follow up visit. ? ?                                 ASSESSMENT , SYMPTOM MANAGEMENT AND PLAN / RECOMMENDATIONS:  ?Palliative Care Encounter ?Noted completed MOST on SNF chart. ?Has legal guardian with DSS-[name] ? ?2.  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   ?Do not advocate routine checks of ammonia unless patient more somnolent or confused. ?Continue rifaximin 550 mg twice daily and lactulose 30 g 4 times daily with a goal of having 4 BMs per day. ? ?3.  Pancytopenia ?Likely secondary to cirrhosis.  Agree with intermittent interval lab work unless patient displays infection or has easy bruising or spontaneous bleeding. ?Need to evaluate sodium, PT/INR, albumin to determine MELD score. ?Patient not likely a candidate for liver transplant. ? ?Advance Care Planning/Goals of Care: Goals include to maximize quality of life and symptom management in current facility. Patient has a DSS guardian-[name] ? ?CODE STATUS: ?Most as of 08/13/2021: ?DNR/DNI ?Comfort measures ?IV fluids and antibiotics on a case-by-case  time-limited trial basis ?Feeding tube to be determined by DSS ? ? ? ?Follow up Palliative Care Visit: Palliative care [name] continue to follow for complex medical decision making, advance care planning, and clarification of goals. Return 4 weeks or prn. ? ? ?This visit was coded based on medical decision making (MDM). ? ?PPS: 50% ? ?HOSPICE ELIGIBILITY/DIAGNOSIS: TBD ? ?Chief Complaint:  ?[facility] received a referral to follow up with patient for chronic disease management, advance directive and defining/refining goals of care.  ? ? ?HISTORY OF PRESENT ILLNESS:  ARNOL MCGIBBON is a 61 y.o. 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  He has scarring and no vision in the right eye.  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  He requires contact-guard assist for ambulation, assist for set up bathing and dressing.  He has a history of dysphagia and is on a dysphagia diet with thin liquids.  He complains of "racing heart" when laying down, intermittent nausea and vomiting.  He has pain in his right ankle from previous fracture and deformity.  Denies bleeding, chest pain, shortness of breath.  Labs from 09/08/2021  with low calcium 8.5, creatinine normal at 0.66, BUN 7.0 with a low osmolality of 277.3, elevated total bilirubin 2.6, low albumin 2.9 ALT normal at 21 with elevated AST of 57.  Hemoglobin A1c 5.5%.  On 09/21/2021 WBC was low at 3.2, RBC low at 3.83, hemoglobin 13.6, hematocrit 40%, elevated MCV of 104.6 and low platelets at baseline 38.  He has a history of elevated ammonia levels and on 11/03/2021 was elevated at 91 which had improved  from the 106 and 130s that he had previously. ? ?History obtained from review of EMR, discussion with facility staff and/or [name] [name].  ?I reviewed available labs, medications, imaging, studies and related documents from the EMR.  Records reviewed and summarized above.  ? ?ROS ?General: NAD ?EYES: Reports slowly improving vision in left eye.  Blind in right eye ?ENMT: denies dysphagia ?Cardiovascular: denies chest pain, denies DOE, complaint of palpitations when laying down ?Pulmonary: denies cough, denies increased SOB ?Abdomen: endorses good appetite, has expected frequent loose stools from lactulose, endorses continence of bowel ?GU: denies dysuria, endorses continence of urine ?MSK:  denies increased weakness, no falls reported ?Skin: denies rashes or wounds ?Neurological: denies pain, denies insomnia ?Psych: Endorses positive mood ?Heme/lymph/immuno: denies bruises, abnormal bleeding ? ?Physical Exam: ?Current and past weights: On 12/03/2020 weight was 208 pounds, on 08/18/2021 weight was 198 pounds ?Constitutional: NAD ?General: obese  ?EYES: anicteric sclera, lids intact, no discharge, noted opaque scarring of right eye ?ENMT: Hard of hearing, oral mucous membranes moist, dentition intact ?CV: S1S2, slow regular rate with left upper sternal border murmur, edema of right ankle 2+ ?Pulmonary: CTAB, no increased work of breathing, no cough, room air ?Abdomen: normo-active BS + 4 quadrants, soft and non tender, no ascites noted ?GU: deferred ?MSK: no sarcopenia, moves all extremities, ambulatory ?Skin: warm and dry, no rashes or wounds on visible skin ?Neuro:  no generalized weakness, noted cognitive impairment ?Psych: non-anxious affect, mildly rapid tangential speech, A and O x 3 ?Hem/lymph/immuno: no widespread bruising ? ? ?Thank you for the opportunity to participate in the care of [name] [name].  The palliative care team [name] continue to follow. Please call our office at [phone] if we can be of additional  assistance.  ? ?Marijo Conception, FNP -C ? ?COVID-19 PATIENT SCREENING TOOL ?Asked and negative response unless otherwise noted:  ? ?Have you had symptoms of covid, tested positive or been in contact with someone with symptoms/positive test in the past 5-10 days?  Unknown ?

## 2021-11-16 ENCOUNTER — Other Ambulatory Visit: Payer: Self-pay

## 2021-12-03 ENCOUNTER — Inpatient Hospital Stay (HOSPITAL_COMMUNITY): Payer: Medicare Other

## 2021-12-03 ENCOUNTER — Other Ambulatory Visit (HOSPITAL_COMMUNITY): Payer: Self-pay

## 2021-12-03 DIAGNOSIS — D539 Nutritional anemia, unspecified: Secondary | ICD-10-CM

## 2021-12-03 DIAGNOSIS — D696 Thrombocytopenia, unspecified: Secondary | ICD-10-CM

## 2021-12-03 DIAGNOSIS — I81 Portal vein thrombosis: Secondary | ICD-10-CM

## 2021-12-09 NOTE — Progress Notes (Deleted)
RESCHEDULED

## 2021-12-10 ENCOUNTER — Inpatient Hospital Stay (HOSPITAL_COMMUNITY): Payer: Medicare Other | Admitting: Physician Assistant

## 2021-12-10 VITALS — BP 125/70 | HR 62 | Temp 97.0°F | Resp 20 | Ht 65.87 in | Wt 190.3 lb

## 2021-12-28 NOTE — Progress Notes (Deleted)
NO SHOW

## 2021-12-29 ENCOUNTER — Encounter: Payer: Self-pay | Admitting: Family Medicine

## 2021-12-29 ENCOUNTER — Inpatient Hospital Stay (HOSPITAL_COMMUNITY): Payer: Medicare Other | Attending: Physician Assistant | Admitting: Physician Assistant

## 2021-12-29 ENCOUNTER — Non-Acute Institutional Stay: Payer: Medicare Other | Admitting: Family Medicine

## 2021-12-29 VITALS — BP 152/86 | HR 69 | Resp 20 | Wt 194.0 lb

## 2021-12-29 DIAGNOSIS — F3112 Bipolar disorder, current episode manic without psychotic features, moderate: Secondary | ICD-10-CM | POA: Insufficient documentation

## 2021-12-29 DIAGNOSIS — D61818 Other pancytopenia: Secondary | ICD-10-CM

## 2021-12-29 NOTE — Progress Notes (Signed)
? ? ?Manufacturing engineer ?Community Palliative Care Consult Note ?Telephone: [phone]  ?Fax: [phone]  ? ? ?Date of encounter: 12/29/21 ?2:35 PM ?PATIENT NAME: [name] [name] ?[address] ?[place] [address]   ?[phone] (home)  ?DOB: 1961-07-21 ?MRN: [id number] ?PRIMARY CARE PROVIDER:    ?[name] Canavan, NP,  ?[place] ?[place] [address] ?[phone] ? ?REFERRING PROVIDER:   ?[name] Canavan, NP ?[place],  [place] [address] ?[phone] ? ?RESPONSIBLE PARTY:    ?Contact Information   ? ? Name Relation Home Work Mobile  ? [place] [phone]  ? ?  ? ? ? ?I met face to face with patient in his skilled nursing facility. Palliative Care was asked to follow this patient by consultation request of  Ejindu, Brunetta Genera, NP to address advance care planning and complex medical decision making. This is a follow up visit. ? ?                                 ASSESSMENT , SYMPTOM MANAGEMENT AND PLAN / RECOMMENDATIONS:  ? ? Bipolar Affective Disorder-Manic, Moderate ?Currently on  Seroquel 50 mg QHS and Zoloft 25 mg daily. ?Sees Psychiatry-encouraged staff to contact psychiatry to discuss current symptoms. ? ?2. Pancytopenia ?Likely secondary to cirrhosis.  Agree with intermittent interval lab work unless patient displays infection or has easy bruising or spontaneous bleeding. ?No active bleeding. ? ?Advance Care Planning/Goals of Care: Goals include to maximize quality of life and symptom management in current facility. Patient has a DSS guardian-[name] [name] ? ?CODE STATUS: ?Most as of 08/13/2021: ?DNR/DNI ?Comfort measures ?IV fluids and antibiotics on a case-by-case time-limited trial basis ?Feeding tube to be determined by DSS ? ? ? ?Follow up Palliative Care Visit: Palliative care will continue to follow for complex medical decision making, advance care planning, and clarification of goals. Return 4 weeks or prn. ? ? ?This  visit was coded based on medical decision making (MDM). ? ?PPS: 50% ? ?HOSPICE ELIGIBILITY/DIAGNOSIS: TBD ? ?Chief Complaint:  ?Palliative Care is following for chronic medical management in setting of dementia with cirrhosis secondary to hepatitis.   ? ? ?HISTORY OF PRESENT ILLNESS:  [name] [name] is a 61 y.o. 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  Sodium 135, INR 1.3, albumin 3.1 to determine MELD-NA score from 07/2021-MELD score 14, MELD-NA score 16 although current tbili was improved 12/08/21 at 1.9 from 3.1 previously.   ?Patient not likely a candidate for liver transplant as he continues to smoke and has been non-compliant at times.  He has pain in his right ankle from previous fracture and deformity.  Denies bleeding, chest pain, shortness of breath.  Labs from 12/08/2021  elevated total bilirubin 1.9, low albumin 3.1.  Hemoglobin A1c 4.3%.  On 12/04/2021 CBC was stable with WBC low at 2.8, RBC low at 3.23, hemoglobin 11.3, and improved low platelets at 51.  He c/o pain in his ear, his head, his stomach in epigastric area and in his right ankle where he states "the hardware is coming loose".  Pt states he has been through "2 of  my 3 month life span and will be going to Hospice next week".  There has been no documentation of changes in his presentation or of Hospice referral in his chart and staff indicates he does not.  Pt states he wants to go home and be with his wife.  He advises she brought his bible and cell phone but he has not seen it but she insists she left it.  Denies CP, SOB, nausea, vomiting.  Nursing staff indicates he has recently been very compliant with his medication and  taking his lactulose.  He states he only smokes 2-3 cigarettes per day and was told he had to stop but he doesn't want to quit.  He states  he does not sleep well and feels like he is anxious with a need for something to "calm my nerves". ? ?History obtained from review of EMR, discussion with facility staff and/or [name] [name].  ?I reviewed available labs, medications, imaging, studies and related documents from the EMR.  Records reviewed and summarized above.  ? ?ROS ?General: NAD ?EYES: Limited vision in left eye.  Blind in right eye ?ENMT: denies dysphagia ?Cardiovascular: denies chest pain, denies DOE, denies palpitations ?Pulmonary: denies cough, denies increased SOB ?Abdomen: endorses good appetite, has expected frequent loose stools from lactulose, endorses continence of bowel ?GU: denies dysuria, endorses continence of urine ?MSK:  denies increased weakness, no falls reported ?Skin: denies rashes or wounds ?Neurological: denies pain, denies insomnia ?Psych: Endorses positive mood ?Heme/lymph/immuno: denies bruises, abnormal bleeding ? ?Physical Exam: ?Current and past weights: On 12/03/2020 weight was 208 pounds, on 08/18/2021 weight was 198 pounds ?Constitutional: NAD ?General: obese  ?EYES: mild scleral icterus of right eye, lids intact, no discharge, noted opaque scarring of right eye ?ENMT: Hard of hearing, oral mucous membranes moist, edentulous ?CV: S1S2, slow regular rate with left upper sternal border murmur, edema of right ankle 2+ ?Pulmonary: CTAB, no increased work of breathing, no cough, room air ?Abdomen: normo-active BS + 4 quadrants, soft and non tender, no ascites noted ?GU: deferred ?MSK: no sarcopenia, moves all extremities, mobile via WC ?Skin: warm and dry, no rashes or wounds on visible skin ?Neuro:  no generalized weakness, noted cognitive impairment ?Psych: mildly rapid tangential speech, A and O x 3 ?Hem/lymph/immuno: some scattered bruising in various stages of healing. ? ? ?Thank you  for the opportunity to participate in the care of [name] [name].  The palliative care team will continue to follow. Please call our office at [phone] if we can be of additional assistance.  ? ?Marijo Conception, FNP -C ? ?COVID-19 PATIENT SCREENING TOOL ?Asked and negative response unless otherwise noted:  ? ?Have you had symptoms of covid, tested positive or been in contact with someone with symptoms/positive test in the past 5-10 days?  no ?

## 2022-01-13 NOTE — Progress Notes (Signed)
Entered in error

## 2022-04-27 ENCOUNTER — Encounter: Payer: Self-pay | Admitting: Family Medicine

## 2022-04-27 ENCOUNTER — Non-Acute Institutional Stay: Payer: Medicare Other | Admitting: Family Medicine

## 2022-04-27 VITALS — BP 148/84 | HR 78 | Resp 18

## 2022-04-27 DIAGNOSIS — K7682 Hepatic encephalopathy: Secondary | ICD-10-CM

## 2022-04-27 DIAGNOSIS — E7220 Disorder of urea cycle metabolism, unspecified: Secondary | ICD-10-CM

## 2022-04-27 NOTE — Progress Notes (Signed)
[facility] Palliative Care Consult Note Telephone: [phone]  Fax: [phone]    Date of encounter: 04/27/22 2:30 PM PATIENT NAME: [name] [name] [address] [place] [address]   [phone] (home)  DOB: [birth year] MRN: [id number] PRIMARY CARE PROVIDER:    Grover Canavan, NP,  [place] [address] [phone]  REFERRING PROVIDER:   Grover Canavan, NP [place] [place],  [place] [address] [phone]  RESPONSIBLE PARTY:    Contact Information     Name Relation Home Work [place] [phone]        I met face to face with patient in assisted living facility. Palliative Care was asked to follow this patient by consultation request of  Ejindu, Brunetta Genera, NP to address advance care planning and complex medical decision making. This is a follow up visit   ASSESSMENT , SYMPTOM MANAGEMENT AND PLAN / RECOMMENDATIONS:   Hepatic encephalopathy/hyperammonemia Monitor for acute decompensaton Monitor for spontaneous bleeding. Continue Xifaxan 550 mg BID and lactulose TID. Primary facility NP has reordered ammonia level in am.   Advance Care Planning/Goals of Care:   Has DSS legal guardian [name] [name] CODE STATUS: Prior code status indicated as DNR      Follow up Palliative Care Visit: Palliative care will continue to follow for complex medical decision making, advance care planning, and clarification of goals. Return 4 weeks or prn.    This visit was coded based on medical decision making (MDM).  PPS: 50%  HOSPICE ELIGIBILITY/DIAGNOSIS: TBD  Chief Complaint:  Received voice message from facility overnight that pt was "not doing well".  Per nurse on unit today, he was unresponsive yesterday with hyperammonemia but better today.  HISTORY OF PRESENT ILLNESS:  [name] [name] is a 61 y.o. 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  Second shift nurse  yesterday left message for provider that pt was "not doing well" and requested call back.  On arrival, pt was noted to be sitting on the side of his bed and did not respond to his name being called. Apparently he was having difficulty hearing his name being called.  Nursing staff said that he was unresponsive last night and his ammonia level was too high but he was reported as "better" today, was eating and responsive.  Nursing staff indicates [name] Many, NP from [facility] had come in and managed his condition.  He denies pain but states that when he went to the hospital his arm was "cut" and he has right forearm with a CDI dressing in place.  Has a similar dressing down front right leg but has some abrasions noted at upper edge of bandage and some small drainage.  He thinks he may have fallen but is not sure what happened to him.  He states "this is my home" but when asked where he was he stated "I don't know".  He knows today is Tuesday, says he independently took an extra dose of lactulose today and has already had 2 Bms.  No noted nausea and vomiting.  No CP, SOB or dysuria.  History obtained from review of EMR, discussion with facility  staff and/or [name] [name].   04/27/22 Ammonia level higher at 145 but specimen was thought to be invalidated due to improper processing.  04/26/22 4:19 pm Ammonia level elevated at 108 CMP remarkable for elevated glucose 106, CL 110, total bili 1.5 and AST 54.  ALT was normal at 23, Cr at 0.66, NA 139.  Low CO2 21, Albumin 3.2, total protein 5.9, osmolality 278.0.  CBC with low WBC 4.0, RBC 3.66, HGB 12.8, HCT 37.7%, PLT 44 (all consistent with  recent labs except WBC higher), elevated MCV 102.9 I reviewed EMR for available labs, medications, imaging, studies and related documents.  There are no new outside records since last visit.  Facility labs/Records reviewed and summarized above.   ROS General: NAD Cardiovascular: denies chest pain, denies DOE Pulmonary: denies cough and SOB Abdomen: endorses good appetite, denies constipation, endorses continence of bowel GU: denies dysuria, endorses continence of urine MSK:  c/o myalgias,  questionable fall reported per pt Skin: wounds as described above Neurological:  denies insomnia Psych: Endorses positive mood Heme/lymph/immuno: denies bruises, abnormal bleeding  Physical Exam: Constitutional: NAD General:WD/obese  EYES: light icteric sclera, lids intact, no discharge  CV: S1S2, RRR, no LE edema Pulmonary: CTAB, no increased work of breathing, no cough, room air Abdomen: normo-active BS + 4 quadrants, soft and non tender, no ascites GU: deferred MSK: no sarcopenia, moves all extremities, uses WC for mobility Skin: warm and dry, no rashes or wounds on visible skin Neuro:  no generalized weakness,  pleasantly confused Psych: non-anxious affect, A and O x 1 Hem/lymph/immuno: no widespread bruising   Thank you for the opportunity to participate in the care of [name] [name].  The palliative care team will continue to follow. Please call our office at [phone] if we can be of additional assistance.   Marijo Conception, FNP -C  COVID-19 PATIENT SCREENING TOOL Asked and negative response unless otherwise noted:   Have you had symptoms of covid, tested positive or been in contact with someone with symptoms/positive test in the past 5-10 days?  unknown

## 2022-06-08 ENCOUNTER — Encounter: Payer: Self-pay | Admitting: Family Medicine

## 2022-06-08 ENCOUNTER — Non-Acute Institutional Stay: Payer: Medicare Other | Admitting: Family Medicine

## 2022-06-08 VITALS — BP 168/80 | HR 84 | Resp 20

## 2022-06-08 DIAGNOSIS — E7220 Disorder of urea cycle metabolism, unspecified: Secondary | ICD-10-CM

## 2022-06-08 DIAGNOSIS — Z515 Encounter for palliative care: Secondary | ICD-10-CM

## 2022-06-08 DIAGNOSIS — K7682 Hepatic encephalopathy: Secondary | ICD-10-CM

## 2022-06-08 NOTE — Progress Notes (Signed)
[facility] Consult Note Telephone: [phone]  Fax: [phone]    Date of encounter: 06/08/22 2:30 PM PATIENT NAME: [name] [name] [address] [place] [address]   [phone] (home)  DOB: Aug 17, 1961 MRN: [id number] PRIMARY CARE PROVIDER:    Grover Canavan, NP,  [place] [address] [phone]  REFERRING PROVIDER:   Grover Canavan, NP [place],  [place] [address] [phone]  RESPONSIBLE PARTY:    Contact Information     Name Relation Home Work [place] [phone]        I met face to face with patient in assisted living facility. Palliative Care was asked to follow this patient by consultation request of  Ejindu, Brunetta Genera, NP to address advance care planning and complex medical decision making. This is a follow up visit   ASSESSMENT , SYMPTOM MANAGEMENT AND PLAN / RECOMMENDATIONS:   Hepatic encephalopathy/hyperammonemia Monitor for acute decompensaton Monitor for spontaneous bleeding. Continue Xifaxan 550 mg BID and lactulose TID. Primary facility NP has reordered ammonia level in am.   Advance Care Planning/Goals of Care:   Has DSS legal guardian [name] CODE STATUS: Prior code status indicated as DNR      Follow up Palliative Care Visit: Palliative care will continue to follow for complex medical decision making, advance care planning, and clarification of goals. Return 4 weeks or prn.    This visit was coded based on medical decision making (MDM).  PPS: 50%  HOSPICE ELIGIBILITY/DIAGNOSIS: TBD  Chief Complaint:  Received voice message from facility overnight that pt was "not doing well".  Per nurse on unit today, he was unresponsive yesterday with hyperammonemia but better today.  HISTORY OF PRESENT ILLNESS:  [name] [name] is a 61 y.o. 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  He was attempting to flirt with provider, rambling, using pressured speech and unable to answer questions-? If combination of mental status and hearing loss. He does say he has a [birth year] coming soon on [birth year].  He reports multiple minor abrasions to bilateral anterior shins.  History obtained from review of EMR, discussion with facility staff and/or Mr. Carpenito.  I reviewed EMR for available labs, medications, imaging, studies and related documents.  There are no new outside records since last visit.  Facility labs/Records reviewed and summarized above.   ROS-unable to give hx HOH, talkative with pressured speech, oriented to give his birthday as [birth year].  Physical Exam: 06/08/22 weight 205 Constitutional: NAD General: WD/obese  EYES: no icterus, has cloudy covering obscuring right iris CV: S1S2, RRR, no LE edema Pulmonary: CTAB, no increased work of breathing, no cough, room air Abdomen: normo-active BS + 4 quadrants, soft and non tender MSK: no sarcopenia, moves all extremities, uses WC for mobility Skin: warm, has multiple scabbed abrasions of anterior shins bilat and left leg weeping  scant clear fluid  Neuro:  no generalized weakness,  pleasantly confused Psych: non-anxious affect, pressure speech, A and O x 1 Hem/lymph/immuno: no widespread bruising   Thank you for the opportunity to participate in the care of Mr. Viviano.  The palliative care  team will continue to follow. Please call our office at [phone] if we can be of additional assistance.   Marijo Conception, FNP -C  COVID-19 PATIENT SCREENING TOOL Asked and negative response unless otherwise noted:   Have  you had symptoms of covid, tested positive or been in contact with someone with symptoms/positive test in the past 5-10 days?  unknown

## 2022-06-09 ENCOUNTER — Encounter: Payer: Self-pay | Admitting: Family Medicine

## 2022-09-23 ENCOUNTER — Non-Acute Institutional Stay: Payer: Medicare Other | Admitting: Family Medicine

## 2022-09-23 ENCOUNTER — Encounter: Payer: Self-pay | Admitting: Family Medicine

## 2022-09-23 VITALS — BP 104/60 | HR 64 | Temp 98.6°F | Resp 20

## 2022-09-23 DIAGNOSIS — E7220 Disorder of urea cycle metabolism, unspecified: Secondary | ICD-10-CM

## 2022-09-23 NOTE — Progress Notes (Signed)
[facility] Consult Note Telephone: [phone]  Fax: [phone]    Date of encounter: 09/23/22 3:30 PM PATIENT NAME: [name] [name] [address] [place] [address]   [phone] (home)  DOB: [birth year] MRN: [id number] PRIMARY CARE PROVIDER:    Grover Canavan, NP,  [place] [address] [phone]  REFERRING PROVIDER:   Grover Canavan, NP [address] [place],  [place] [address] [phone]  RESPONSIBLE PARTY:    Contact Information     Name Relation Home Work [place] [phone]        I met face to face with patient in [facility] facility. Palliative Care was asked to follow this patient by consultation request of  Ejindu, Brunetta Genera, NP to address advance care planning and complex medical decision making. This is a follow up visit   ASSESSMENT , SYMPTOM MANAGEMENT AND PLAN / RECOMMENDATIONS:   Hyperammonemia Stable, elevated ammonia level at 88. Continue Xifaxan and lactulose. Monitor for increased sedation.  Advance Care Planning/Goals of Care:   Has DSS legal guardian [name] [phone]  CODE STATUS: Prior code status indicated as DNR MOST as of 08/03/21: DNR/DNI with comfort measures Antibiotics and IV fluids on case by case, time limited basis Feeding tube to be determined by DSS    Follow up Palliative Care Visit: Palliative care will continue to follow for complex medical decision making, advance care planning, and clarification of goals. Return 4 weeks or prn.    This visit was coded based on medical decision making (MDM).  PPS: 50%  HOSPICE ELIGIBILITY/DIAGNOSIS: TBD  Chief Complaint:  Palliative Care is continuing to follow patient for chronic medical management in setting of dementia complicated by Bipolar Disorder and cirrhosis.  HISTORY OF PRESENT  ILLNESS:  [name] [name] is a 62 y.o. 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   Appetite has been good per staff and even will eat extra portions at times. Requires assistance with bathing and dressing, some assistance to go from laying to sitting in bed.  He is very hard of hearing and states he had a recent fall but was unhurt.  He self propels on the halls in his wc, continues to go out to smoke a couple of times per day.  History obtained from review of EMR, discussion with facility staff and/or [name].  I reviewed EMR for available labs, medications, imaging, studies and related documents.   [facility] labs on chart  07/28/22  Elevated ammonia 88 HGB A1c 5.3%  ROS-unable to give hx HOH, sleeping/difficult to arouse initially but once awake sits up with noted tremor in left arm while holding himself up.  Physical Exam: No available weight Constitutional: NAD General: Mildly disheveled,obese  EYES: no icterus, has cloudy covering obscuring right iris CV: S1S2, slow regular rate, 1+ BLE edema Pulmonary: CTAB, no increased work of breathing, no cough, room air Abdomen: normo-active BS + 4 quadrants, soft and non tender MSK: no sarcopenia, moves all extremities, uses WC for mobility but able to use hands and feet to self propel Skin: warm, dry Neuro:  no generalized weakness,  asleep/arousable and confused Psych: non-anxious affect, A and O x 1 Hem/lymph/immuno: no widespread bruising   Thank you for the opportunity to participate in the care of [name].  The palliative care team will continue to follow. Please call our office at [phone] if we can be of additional  assistance.   Marijo Conception, FNP -C  COVID-19 PATIENT SCREENING TOOL Asked and negative response unless otherwise noted:   Have you had symptoms of covid, tested positive or been in contact with someone with symptoms/positive test in the past 5-10 days?  unknown

## 2022-12-06 ENCOUNTER — Non-Acute Institutional Stay: Payer: Medicare Other | Admitting: Family Medicine

## 2022-12-06 VITALS — BP 128/76 | HR 70 | Temp 98.0°F | Resp 20

## 2022-12-06 DIAGNOSIS — D61818 Other pancytopenia: Secondary | ICD-10-CM

## 2022-12-06 DIAGNOSIS — K7460 Unspecified cirrhosis of liver: Secondary | ICD-10-CM

## 2022-12-06 NOTE — Progress Notes (Signed)
[facility] Palliative Care Consult Note Telephone: [phone]  Fax: [phone]   Date of encounter: 12/06/22 4:14 PM PATIENT NAME: [name] [name] [address] [place] [address]   [phone] (home)  DOB: April 22, 1961 MRN: [id number] PRIMARY CARE PROVIDER:    Benita Stabile, NP,  [address] [place] [address] [phone]  REFERRING PROVIDER:   Benita Stabile, NP [address] [place],  [place] [address] [phone]  Health Care Agent/Health Care Power of Attorney:    Contact Information     Name Relation Home Work [place] [employer] [phone]        I met face to face with patient in [facility] facility. Palliative Care was asked to follow this patient by consultation request of Ejindu, Melissa Noon, NP to address advance care planning and complex medical decision making. This is a follow up visit.          Legal guardian:  Chance Chalmers Guest Review of an existing advance directive document-MOST  CODE STATUS: MOST as of 08/03/21: DNR/DNI with comfort measures Use of antibiotics and IV fluids on a case by case, time limited basis Feeding tube not addressed.    ASSESSMENT AND / RECOMMENDATIONS:  PPS: 50%  Hepatic cirrhosis without ascites No evidence of hypersomnolence, confusion Educated on need to have multiple Bms per day to prevent build up of toxic ammonia Continue Lactulose and Xifaxan  2.    Coagulopathy No evidence of abnormal bleeding Encourage caution if attempting to use Ibuprofen for the shortest duration and at lowest dose possible due to risk for bleed.  Follow up Palliative Care Visit:  Palliative Care continuing to follow up by monitoring for changes in appetite, weight, functional and cognitive status for chronic disease progression and management in agreement with patient's stated goals of care. Next visit in 4 weeks or prn.  This visit was  coded based on medical decision making (MDM).  Chief Complaint  Palliative Care is continuing to follow patient for chronic medical management in setting of dementia complicated by Bipolar Disorder and cirrhosis with c/o loose stools and RLQ pain.  HISTORY OF PRESENT ILLNESS: [name] [name] is a 62 y.o. year old male with cirrhosis on Xifaxan and Lactulose to help prevent hyperammonemia. Pt reports having multiple loose stools per day and educated that this is what helps him control his ammonia levels.  Denies nausea/vomiting, hypersomnolence.  He is seen self propelling his wc around the facility without difficulty and said he has recently been seen by the eye doctor.   ACTIVITIES OF DAILY LIVING: CONTINENT OF BLADDER/ BOWEL? Yes Bathing/Dressing-requires assistance Independent with feeding himself  MOBILITY:   WHEELCHAIR, requires assistance for transfers  APPETITE? GOOD  CURRENT PROBLEM LIST:  Patient Active Problem List   Diagnosis Date Noted   Bipolar affective disorder, currently manic, moderate 12/29/2021   LLQ abdominal pain 04/30/2021   Diarrhea 04/30/2021   Increased ammonia level 04/30/2021   Atrial fibrillation 01/23/2019   Chronic pain syndrome    Cellulitis of right lower extremity 02/17/2018   Dysphagia 10/10/2017   Colon cancer screening 10/10/2017   Palliative care encounter    Goals of care, counseling/discussion    DNR (do not resuscitate) discussion    Encounter for hospice care discussion    Subarachnoid hemorrhage 12/29/2016   Fall 12/21/2016   Cerebral contusion 12/21/2016   Pressure injury of skin 12/14/2016   Anxiety    Suicidal  ideation    Hypokalemia 02/01/2013   Hyperammonemia 01/31/2013   Urinary incontinence 01/31/2013   Pancytopenia (HCC) 09/17/2012   Edema of right lower extremity 04/28/2012   Hepatic encephalopathy 04/28/2012   Arthritis of ankle or foot, degenerative 04/26/2012   Thrombocytopenia 12/07/2011   Anemia 12/07/2011   COPD  (chronic obstructive pulmonary disease) 08/25/2011   DM (diabetes mellitus) 08/25/2011   HTN (hypertension) 08/25/2011   Coagulopathy 08/25/2011   Cirrhosis 08/25/2011   Depression 08/25/2011   Seizure disorder 08/25/2011   GERD (gastroesophageal reflux disease) 08/25/2011   Tobacco abuse 08/25/2011   PAST MEDICAL HISTORY:  Active Ambulatory Problems    Diagnosis Date Noted   COPD (chronic obstructive pulmonary disease) 08/25/2011   DM (diabetes mellitus) 08/25/2011   HTN (hypertension) 08/25/2011   Coagulopathy 08/25/2011   Cirrhosis 08/25/2011   Depression 08/25/2011   Seizure disorder 08/25/2011   GERD (gastroesophageal reflux disease) 08/25/2011   Tobacco abuse 08/25/2011   Thrombocytopenia 12/07/2011   Anemia 12/07/2011   Arthritis of ankle or foot, degenerative 04/26/2012   Edema of right lower extremity 04/28/2012   Hepatic encephalopathy 04/28/2012   Pancytopenia (HCC) 09/17/2012   Hyperammonemia 01/31/2013   Urinary incontinence 01/31/2013   Hypokalemia 02/01/2013   Anxiety    Suicidal ideation    Pressure injury of skin 12/14/2016   Fall 12/21/2016   Cerebral contusion 12/21/2016   Subarachnoid hemorrhage 12/29/2016   Palliative care encounter    Goals of care, counseling/discussion    DNR (do not resuscitate) discussion    Encounter for hospice care discussion    Dysphagia 10/10/2017   Colon cancer screening 10/10/2017   Cellulitis of right lower extremity 02/17/2018   Chronic pain syndrome    Atrial fibrillation 01/23/2019   LLQ abdominal pain 04/30/2021   Diarrhea 04/30/2021   Increased ammonia level 04/30/2021   Bipolar affective disorder, currently manic, moderate 12/29/2021   Resolved Ambulatory Problems    Diagnosis Date Noted   Ankle fracture, bimalleolar, closed 08/04/2011   Hematemesis 12/07/2011   Acute hepatic encephalopathy (HCC) 08/02/2013   Altered mental state 12/07/2015   Encephalopathy 12/13/2016   Encephalopathy 12/21/2016    Acute encephalopathy 12/29/2016   Bacteremia 12/29/2016   Altered mental status 09/11/2017   Atrial fibrillation with RVR 01/22/2019   Past Medical History:  Diagnosis Date   Ankle deformity, right    Arthritis    Bipolar 1 disorder (HCC)    Blind right eye    Blood clots in brain    Cardiomegaly    Cataract    Cerebrovascular disease    Cholelithiasis    Chronic back pain    Chronic pain    Dementia (HCC)    Diabetes mellitus    Diverticulosis    Dyspnea    Hard of hearing    Hepatitis    Hyperlipidemia    Hypertension    Lymphedema    Mental retardation    Noncompliance    Seizures (HCC)    SOCIAL HX:  Social History   Tobacco Use   Smoking status: Some Days    Packs/day: 1.00    Years: 30.00    Additional pack years: 0.00    Total pack years: 30.00    Types: Cigarettes   Smokeless tobacco: Never   Tobacco comments:    only smokes about 1-2 cigarettes a day.  Doesn't want to quit.  Substance Use Topics   Alcohol use: No   FAMILY HX:  Family History  Problem Relation Age of  Onset   Heart failure Mother    Thyroid disease Mother    Cancer Father    Asthma Brother    Heart attack Brother    Cirrhosis Brother        EtOH   Colon cancer Maternal Aunt        Preferred Pharmacy: ALLERGIES:  Allergies  Allergen Reactions   Penicillins Rash    Has patient had a PCN reaction causing immediate rash, facial/tongue/throat swelling, SOB or lightheadedness with hypotension: Yes Has patient had a PCN reaction causing severe rash involving mucus membranes or skin necrosis: No Has patient had a PCN reaction that required hospitalization No Has patient had a PCN reaction occurring within the last 10 years: No If all of the above answers are "NO", then may proceed with Cephalosporin use.      PERTINENT MEDICATIONS:  Outpatient Encounter Medications as of 12/06/2022  Medication Sig   acetaminophen (TYLENOL) 325 MG tablet Take 650 mg by mouth every 8 (eight)  hours as needed for moderate pain or fever (temperature 101 and greater).   ammonium lactate (AMLACTIN) 12 % cream Apply 1 application topically daily as needed for dry skin. Apply to right leg   bisacodyl (DULCOLAX) 5 MG EC tablet Take 5 mg by mouth daily.   Calcium Carbonate (CALCIUM 500 PO) Take 500 mg by mouth in the morning and at bedtime.   ezetimibe (ZETIA) 10 MG tablet Take 10 mg by mouth daily.   furosemide (LASIX) 20 MG tablet Take 1 tablet (20 mg total) by mouth daily. (Patient taking differently: Take 10 mg by mouth in the morning.)   furosemide (LASIX) 40 MG tablet Take 40 mg by mouth daily.   gabapentin (NEURONTIN) 100 MG capsule Take 200-400 mg by mouth See admin instructions. Take 200 mg by mouth in the morning and 400 mg at bedtime   ibuprofen (ADVIL) 400 MG tablet Take by mouth.   isosorbide dinitrate (ISORDIL) 10 MG tablet Take 10 mg by mouth 2 (two) times daily.   lactulose (CHRONULAC) 10 GM/15ML solution Take 60 mLs (40 g total) by mouth 3 (three) times daily. Titrate for 3-4 bowel motions daily. (Patient taking differently: Take 40 g by mouth 3 (three) times daily.)   melatonin 3 MG TABS tablet Take 6 mg by mouth at bedtime.   Menthol, Topical Analgesic, 4 % GEL Apply 1 application topically 3 (three) times daily as needed (pain).   metoprolol tartrate (LOPRESSOR) 25 MG tablet Take 25 mg by mouth 2 (two) times daily.   omeprazole (PRILOSEC) 20 MG capsule Take 20 mg by mouth in the morning.   polyethylene glycol-electrolytes (NULYTELY) 420 g solution As directed   potassium chloride SA (K-DUR) 20 MEQ tablet Take 1 tablet (20 mEq total) by mouth daily. (Patient taking differently: Take 20 mEq by mouth in the morning.)   QUEtiapine (SEROQUEL) 50 MG tablet Take 50 mg by mouth at bedtime.   rifaximin (XIFAXAN) 550 MG TABS tablet Take 1 tablet (550 mg total) by mouth 2 (two) times daily.   sertraline (ZOLOFT) 25 MG tablet Take 25 mg by mouth daily.   traMADol (ULTRAM) 50 MG tablet  Take 25 mg by mouth 2 (two) times daily.   No facility-administered encounter medications on file as of 12/06/2022.    History obtained from review of EMR, discussion with  facility staff/caregiver and/or patient.     I reviewed available labs, medications, imaging, studies and related documents from the EMR.  There were no new  records/imaging since last visit.   Physical Exam: GENERAL: NAD LUNGS: CTAB, no increased work of breathing, room air CARDIAC:  S1S2, RRR with no MRG, RLE taut with non-pitting edema/LLE no edema, No cyanosis ABD:  Normo-active BS x 4 quads, soft, non-tender, no guarding or rebound EXTREMITIES: Normal ROM, no deformity, strength equal but weaker in BLE NEURO:  Noted cognitive impairment PSYCH:  non-anxious affect, A & O x 2  Thank you for the opportunity to participate in the care of [name] [name]. Please call our main office at [phone] if we can be of additional assistance.    Joycelyn Man FNP-C  [name]  Phone:  [phone]

## 2022-12-25 ENCOUNTER — Encounter: Payer: Self-pay | Admitting: Family Medicine

## 2023-01-26 ENCOUNTER — Non-Acute Institutional Stay: Payer: Medicare Other | Admitting: Family Medicine

## 2023-03-29 ENCOUNTER — Inpatient Hospital Stay: Payer: Medicare Other | Attending: Oncology | Admitting: Oncology

## 2023-03-29 VITALS — BP 133/69 | HR 57 | Temp 97.8°F | Resp 16

## 2023-03-29 DIAGNOSIS — R197 Diarrhea, unspecified: Secondary | ICD-10-CM | POA: Insufficient documentation

## 2023-03-29 DIAGNOSIS — R5383 Other fatigue: Secondary | ICD-10-CM | POA: Insufficient documentation

## 2023-03-29 DIAGNOSIS — D61818 Other pancytopenia: Secondary | ICD-10-CM | POA: Diagnosis present

## 2023-03-29 DIAGNOSIS — R1310 Dysphagia, unspecified: Secondary | ICD-10-CM | POA: Diagnosis not present

## 2023-03-29 DIAGNOSIS — Z803 Family history of malignant neoplasm of breast: Secondary | ICD-10-CM | POA: Insufficient documentation

## 2023-03-29 DIAGNOSIS — R110 Nausea: Secondary | ICD-10-CM | POA: Diagnosis not present

## 2023-03-29 DIAGNOSIS — H5461 Unqualified visual loss, right eye, normal vision left eye: Secondary | ICD-10-CM | POA: Diagnosis not present

## 2023-03-29 DIAGNOSIS — D72819 Decreased white blood cell count, unspecified: Secondary | ICD-10-CM | POA: Diagnosis not present

## 2023-03-29 DIAGNOSIS — I81 Portal vein thrombosis: Secondary | ICD-10-CM | POA: Diagnosis present

## 2023-03-29 DIAGNOSIS — D696 Thrombocytopenia, unspecified: Secondary | ICD-10-CM | POA: Insufficient documentation

## 2023-03-29 DIAGNOSIS — F17210 Nicotine dependence, cigarettes, uncomplicated: Secondary | ICD-10-CM | POA: Insufficient documentation

## 2023-03-29 DIAGNOSIS — Z808 Family history of malignant neoplasm of other organs or systems: Secondary | ICD-10-CM | POA: Insufficient documentation

## 2023-03-29 DIAGNOSIS — I4891 Unspecified atrial fibrillation: Secondary | ICD-10-CM | POA: Insufficient documentation

## 2023-03-29 DIAGNOSIS — Z800 Family history of malignant neoplasm of digestive organs: Secondary | ICD-10-CM | POA: Diagnosis not present

## 2023-03-29 NOTE — Progress Notes (Signed)
[facility] [address], [place] [address]   CLINIC:  Medical Oncology/Hematology  PCP:  Benita Stabile, NP [address] [place] [address] [phone]   REASON FOR VISIT:  Follow-up for cirrhosis-related pancytopenia and chronic portal vein thrombosis  PRIOR THERAPY: None  CURRENT THERAPY: Observation  INTERVAL HISTORY:  [name] [name] 62 y.o. male returns for routine follow-up of his pancytopenia and portal vein thrombosis.  He was last seen by Rojelio Brenner PA-C on 12/10/21.    Patient is accompanied today by his Child psychotherapist / group home staff member.  Patient is extremely hard of hearing, and his hearing aid is not working properly today, so history today is provided by his accompanying staff member.  He is currently not on anticoagulation d/t portal vein thrombosis in the setting of advanced cirrhosis and risk of life-threatening bleeding events due to platelet count less than 50 and history of esophageal varices.  He also has frequent falls per his caregiver.  Since his last visit, he has not had any additional nosebleeds.  He has been doing more or less the same.  Appetite is 100% and energy levels are 50%.  He has intermittent abdominal pain, trouble swallowing at times, diarrhea and nausea intermittently.  Has dizzy spells when he stands on occasion.  Has some anxiety and trouble falling asleep.  These are all chronic problems and are not getting worse.  He sees ENT for chronic right ear infections and dysphagia.  He has follow-up with them in a few days.  REVIEW OF SYSTEMS:  Review of Systems  Constitutional:  Positive for fatigue.  HENT:   Positive for hearing loss and trouble swallowing.        Right ear pain   Gastrointestinal:  Positive for diarrhea and nausea.  Genitourinary:  Positive for bladder incontinence and frequency.   Neurological:  Positive for dizziness.  Hematological:  Bruises/bleeds easily.  Psychiatric/Behavioral:   Positive for sleep disturbance.       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   Cirrhosis (HCC)    diagnosed 07/2010 when presented with first episode of hepatic encephalopathy, afp on 416/13=4.8,  U/S on 12/14/11  liver stable, pt has received 2 hep A/B vaccines   COPD (chronic obstructive pulmonary disease) (HCC)    Dementia (HCC)    Depression    Diabetes mellitus    Diverticulosis    Dyspnea    GERD (gastroesophageal reflux disease)    Hard of hearing    Hematemesis 12/07/2011   Hepatic encephalopathy (HCC)    Hepatitis    Hyperlipidemia    Hypertension    Lymphedema    R>L   Mental retardation    Noncompliance    Seizures (HCC)    Thrombocytopenia (HCC)  Past Surgical History:  Procedure Laterality Date   CAST APPLICATION  08/30/2011   Procedure: MINOR CAST APPLICATION;  Surgeon: Fuller Canada, MD;  Location: [facility];  Service: Orthopedics;  Laterality: Right;  no anesthesia , procedure room do not need or room !   CATARACT EXTRACTION W/PHACO Left 03/02/2021   Procedure: CATARACT EXTRACTION PHACO AND INTRAOCULAR LENS PLACEMENT LEFT EYE;  Surgeon: Fabio Pierce, MD;  Location: [facility];  Service: Ophthalmology;  Laterality: Left;  CDE   37.66   COLONOSCOPY  12/15/11   colonic divericulosis;poor prep, ACBE  recommended but has not been done   COLONOSCOPY WITH PROPOFOL N/A 11/07/2017   Dr. Jena Gauss: inadequate prep, internal  hemorrhoids. Early interval due March 2020 due to poor prep   ear operations     ESOPHAGOGASTRODUODENOSCOPY  02/02/2011   Rourk-Normal esophagus.  No varices/Question mild portal gastropathy, extrinsic compression on the antrum, lesser curvature of uncertain significance, some minimally  nodular mucosa status post biopsy, patent pylorus, normal duodenum 1 and duodenum 2.   ESOPHAGOGASTRODUODENOSCOPY  12/15/11   Rourk-->mild changes of portal gastropathy, gastric/duodenal erosions s/p bx  (reactive changes with mild chronic inflammation. No H.pylori   ESOPHAGOGASTRODUODENOSCOPY (EGD) WITH PROPOFOL N/A 11/07/2017   Dr. Jena Gauss: normal esophagus s/p dilation, portal hypertensive gastropathy, erosive gastropathy, normal duodenum   FOOT SURGERY     MALONEY DILATION N/A 11/07/2017   Procedure: MALONEY DILATION;  Surgeon: Corbin Ade, MD;  Location: [facility];  Service: Endoscopy;  Laterality: N/A;   ORIF ANKLE FRACTURE  08/27/2011   Procedure: OPEN REDUCTION INTERNAL FIXATION (ORIF) ANKLE FRACTURE;  Surgeon: Fuller Canada, MD;  Location: [facility];  Service: Orthopedics;  Laterality: Right;   TONSILLECTOMY       SOCIAL HISTORY:  Social History   Socioeconomic History   Marital status: Married    Spouse name: Not on file   Number of children: 0   Years of education: Not on file   Highest education level: Not on file  Occupational History   Occupation: disabled    Employer: UNEMPLOYED  Tobacco Use   Smoking status: Some Days    Current packs/day: 1.00    Average packs/day: 1 pack/day for 30.0 years (30.0 ttl pk-yrs)    Types: Cigarettes   Smokeless tobacco: Never   Tobacco comments:    only smokes about 1-2 cigarettes a day.  Doesn't want to quit.  Vaping Use   Vaping status: Never Used  Substance and Sexual Activity   Alcohol use: No   Drug use: No   Sexual activity: Not on file  Other Topics Concern   Not on file  Social History Narrative   Not on file   Social Determinants  of Health   Financial Resource Strain: Low Risk  (04/07/2020)   Received from [facility], [facility]   Overall Financial Resource Strain (CARDIA)    Difficulty of Paying Living Expenses: Not very hard  Food Insecurity: No Food Insecurity (04/07/2020)   [facility], [facility]   Hunger Vital Sign    Worried About Running Out of Food in the Last Year: Never true    Ran Out of Food in the Last Year: Never true  Transportation Needs: No Transportation Needs (04/07/2020)   Received from [facility], [facility]   PRAPARE - Transportation    Lack of Transportation (Medical): No    Lack of Transportation (Non-Medical): No  Physical Activity: Inactive (04/07/2020)   Received from [facility], [facility]  Exercise Vital Sign    Days of Exercise per Week: 0 days    Minutes of Exercise per Session: 0 min  Stress: No Stress Concern Present (10/26/2021)   Received from [facility], [facility] of Occupational Health - Occupational Stress Questionnaire    Feeling of Stress : Not at all  Social Connections: Unknown (12/28/2021)   Received from [facility], [facility]   Social Network    Social Network: Not on file  Intimate Partner Violence: Unknown (11/25/2021)   Received from [facility], [facility]   HITS    Physically Hurt: Not on file    Insult or Talk Down To: Not on file    Threaten Physical Harm: Not on file    Scream or Curse: Not on file    FAMILY HISTORY:  Family History  Problem Relation Age of Onset   Heart failure Mother    Thyroid disease Mother    Cancer Father    Asthma Brother    Heart attack Brother    Cirrhosis Brother        EtOH   Colon cancer Maternal Aunt     CURRENT MEDICATIONS:  Outpatient Encounter Medications as of 03/29/2023  Medication Sig Note   acetaminophen (TYLENOL) 325 MG tablet Take 650 mg by mouth every 8 (eight) hours as needed for moderate pain or fever (temperature 101 and greater).     ammonium lactate (AMLACTIN) 12 % cream Apply 1 application topically daily as needed for dry skin. Apply to right leg    bisacodyl (DULCOLAX) 5 MG EC tablet Take 5 mg by mouth daily.    Calcium Carbonate (CALCIUM 500 PO) Take 500 mg by mouth in the morning and at bedtime.    ezetimibe (ZETIA) 10 MG tablet Take 10 mg by mouth daily.    furosemide (LASIX) 20 MG tablet Take 1 tablet (20 mg total) by mouth daily. (Patient taking differently: Take 10 mg by mouth in the morning.)    furosemide (LASIX) 40 MG tablet Take 40 mg by mouth daily. 06/11/2021: Last dose scheduled for 06/12/2021   gabapentin (NEURONTIN) 100 MG capsule Take 200-400 mg by mouth See admin instructions. Take 200 mg by mouth in the morning and 400 mg at bedtime    ibuprofen (ADVIL) 400 MG tablet Take by mouth.    isosorbide dinitrate (ISORDIL) 10 MG tablet Take 10 mg by mouth 2 (two) times daily.    lactulose (CHRONULAC) 10 GM/15ML solution Take 60 mLs (40 g total) by mouth 3 (three) times daily. Titrate for 3-4 bowel motions daily. (Patient taking differently: Take 40 g by mouth 3 (three) times daily.)    melatonin 3 MG TABS tablet Take 6 mg by mouth at bedtime.    Menthol, Topical Analgesic, 4 % GEL Apply 1 application topically 3 (three) times daily as needed (pain).    metoprolol tartrate (LOPRESSOR) 25 MG tablet Take 25 mg by mouth 2 (two) times daily.    omeprazole (PRILOSEC) 20 MG capsule Take 20 mg by mouth in the morning.    polyethylene glycol-electrolytes (NULYTELY) 420 g solution As directed    potassium chloride SA (K-DUR) 20 MEQ tablet Take 1 tablet (20 mEq total) by mouth daily. (Patient taking differently: Take 20 mEq by mouth in the morning.)    QUEtiapine (SEROQUEL) 50 MG tablet Take 50 mg by mouth at bedtime.    rifaximin (XIFAXAN) 550 MG TABS tablet Take 1 tablet (550 mg total) by mouth 2 (  two) times daily.    sertraline (ZOLOFT) 25 MG tablet Take 25 mg by mouth daily.    traMADol (ULTRAM) 50 MG tablet Take 25  mg by mouth 2 (two) times daily.    No facility-administered encounter medications on file as of 03/29/2023.    ALLERGIES:  Allergies  Allergen Reactions   Penicillins Rash    Has patient had a PCN reaction causing immediate rash, facial/tongue/throat swelling, SOB or lightheadedness with hypotension: Yes Has patient had a PCN reaction causing severe rash involving mucus membranes or skin necrosis: No Has patient had a PCN reaction that required hospitalization No Has patient had a PCN reaction occurring within the last 10 years: No If all of the above answers are "NO", then may proceed with Cephalosporin use.      PHYSICAL EXAM:  ECOG PERFORMANCE STATUS: 1 - Symptomatic but completely ambulatory  Vitals:   03/29/23 1135  BP: 133/69  Pulse: (!) 57  Resp: 16  Temp: 97.8 F (36.6 C)  SpO2: 99%   There were no vitals filed for this visit. Physical Exam Constitutional:      Appearance: Normal appearance. He is obese.  Cardiovascular:     Rate and Rhythm: Normal rate and regular rhythm.  Pulmonary:     Effort: Pulmonary effort is normal.     Breath sounds: Normal breath sounds.  Abdominal:     General: Bowel sounds are normal.     Palpations: Abdomen is soft.  Musculoskeletal:        General: No swelling. Normal range of motion.  Neurological:     Mental Status: He is alert and oriented to person, place, and time. Mental status is at baseline.      LABORATORY DATA:  I have reviewed the labs as listed.  CBC    Component Value Date/Time   WBC 2.2 (L) 07/30/2021 0912   RBC 3.69 (L) 07/30/2021 0912   HGB 13.4 07/30/2021 0912   HGB 14.5 11/13/2008 0915   HCT 38.8 (L) 07/30/2021 0912   HCT 28 04/06/2012 1550   PLT 35 (L) 07/30/2021 0912   PLT 97 (L) 11/13/2008 0915   MCV 105.1 (H) 07/30/2021 0912   MCV 88.0 04/06/2012 1550   MCH 36.3 (H) 07/30/2021 0912   MCHC 34.5 07/30/2021 0912   RDW 13.8 07/30/2021 0912   RDW 13.1 11/13/2008 0915   LYMPHSABS 0.6 (L)  07/30/2021 0912   LYMPHSABS 2.3 11/13/2008 0915   MONOABS 0.4 07/30/2021 0912   MONOABS 0.4 11/13/2008 0915   EOSABS 0.2 07/30/2021 0912   EOSABS 0.2 11/13/2008 0915   BASOSABS 0.0 07/30/2021 0912   BASOSABS 0.0 11/13/2008 0915      Latest Ref Rng & Units 07/30/2021    9:12 AM 06/15/2021    8:09 AM 03/30/2021    8:48 AM  CMP  Glucose 70 - 99 mg/dL 161   096   BUN 8 - 23 mg/dL 10   7   Creatinine 0.45 - 1.24 mg/dL 4.09  8.11  9.14   Sodium 135 - 145 mmol/L 136   139   Potassium 3.5 - 5.1 mmol/L 4.1   3.6   Chloride 98 - 111 mmol/L 108   108   CO2 22 - 32 mmol/L 23   25   Calcium 8.9 - 10.3 mg/dL 8.6   8.8   Total Protein 6.5 - 8.1 g/dL 6.1   6.3   Total Bilirubin 0.3 - 1.2 mg/dL 3.0   2.5   Alkaline  Phos 38 - 126 U/L 70   87   AST 15 - 41 U/L 51   47   ALT 0 - 44 U/L 22   21     DIAGNOSTIC IMAGING:  I have independently reviewed the relevant imaging and discussed with the patient.  ASSESSMENT & PLAN: 1.  Chronic portal vein thrombosis  - Referred by GI team Ermalinda Memos, PA-C) on 06/17/2021 for hematology input on chronic portal vein thrombosis and whether or not patient should be anticoagulated - CT abdomen/pelvis (06/15/2021): Hepatic cirrhosis noted.  Suspected chronic occlusion of portal vein with large collateral veins in the left upper quadrant and left retroperitoneal region consistent with portal hypertension. - Patient has not had any other history of thrombotic events such as PE or DVT.  - History of frequent epistaxis although this is improved over the last year. - Currently not on anticoagulation given risk first benefit.  Platelet count remains low less than 50 and he is a high fall risk.  Discussed again with patient and caregiver that the goal of anticoagulation would be to prevent recurrent thrombus, prevent thrombus extension and promote recanalization. - Patient has moderate risk of recurrent thrombosis and thrombus extension, given his underlying  cirrhosis. - We will continue to monitor his labs every 4 months and he will remain off anticoagulation for now.  2.  Thrombocytopenia, moderate to severe: - Review of EMR moderate shows moderate thrombocytopenia since 2011 - platelets have been ranging from 30-60 over the past year. - Prior imaging including CTAP from 2012 and ultrasound of the abdomen from 03/27/2018 shows moderate splenomegaly. - He reportedly has hep C, was treated for it.  Has a known diagnosis of cirrhosis, is on Xifaxan and lactulose. 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 -Laboratory work-up reviewed [facility] clinical-platelet count is 46,000.  Previously 60,000. - Differential diagnosis favors thrombocytopenia secondary to liver disease and splenomegaly; unable to rule out MDS at this time, will consider bone marrow biopsy/splenic artery embolization if platelets consistently or below 30,000 or if significant deviation from baseline.   3.  Macrocytic anemia: - Intermittent episodes of mild anemia with persistent macrocytosis secondary to liver disease - EGD (11/07/2017): Portal hypertensive gastropathy with erosive gastropathy - Colonoscopy (11/07/2017): Internal hemorrhoids, otherwise normal colon (limited by poor prep) -Laboratory work-up reviewed (01/15/2021): Normal ferritin, iron; negative RF and ANA; normal copper, folate, methylmalonic acid, B12; SPEP negative for M spike (polyclonal increase in gamma globulin indicative of hypergammaglobulinemia which is found in a variety of disease states) - LDH elevated at 252, reticulocytes elevated at 4.0%; bilirubin elevated at 3.2 on 12/03/2020 - Labs from  today show a hemoglobin of 13.5 with MCV of 106.3.  Platelet count is 46,000. - No significant anemia at this time.  Macrocytosis is likely due to liver disease. -Will continue to monitor.   4.  Leukopenia: - He has occasional mild leukopenia with neutropenia. - He is also on number of medications which could lower his white count.  Splenomegaly can also contribute to leukopenia.  He does not have recurrent infections or B symptoms.   - Work-up above did not reveal any nutritional deficiencies - CBC today showed a white count of 2.5 with an  absolute neutrophil count of 1.3.  Discussed neutropenic precautions.  No recent infections. -We will continue to monitor at this time.   5.  Social/family history: - Patient resides at the [facility] in [place] - [facility] is also significant for history of hemorrhage of the brain as well as blood clots. He has a deformed right ankle from a previous injury. 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 He reports that he has COPD.  He has known cirrhosis of the liver. - He used to work in a [employer].  He smoked for 43 years, 1 and half pack per day and quit 6 months ago. - Father had cancer of the brain.  Mother had breast cancer and tongue cancer.  Brother had cancer of the liver.  His mother siblings also had cancer.  PLAN SUMMARY: >> RTC in 4 months for follow-up with lab work a few days before from [facility] clinical.  Recommend CBC, CMP and LDH      All questions were answered. The patient knows to call the clinic with any problems, questions or concerns.  Medical decision making: Moderate  Time spent on visit: I spent 25 minutes dedicated to the care of this patient (face-to-face and non-face-to-face) on the date of the encounter to include what is described in the assessment and plan.   Mauro Kaufmann, NP  07/30/2021 10:55 AM

## 2023-05-24 ENCOUNTER — Encounter: Payer: Self-pay | Admitting: Family

## 2023-07-28 ENCOUNTER — Inpatient Hospital Stay: Payer: Medicare Other | Attending: Oncology | Admitting: Oncology

## 2023-08-25 ENCOUNTER — Inpatient Hospital Stay: Payer: Medicare Other | Admitting: Oncology

## 2023-09-08 ENCOUNTER — Inpatient Hospital Stay: Payer: Medicare Other | Admitting: Oncology

## 2023-09-15 ENCOUNTER — Inpatient Hospital Stay: Payer: Medicare Other | Admitting: Oncology

## 2023-09-22 NOTE — Progress Notes (Signed)
[facility] [address] [place], [place] [address]   CLINIC:  Medical Oncology/Hematology  PCP:  Benita Stabile, NP [address] 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 [name] [name] 63 y.o. male returns for routine follow-up of his pancytopenia and portal vein thrombosis.  He was last seen by me on 03/29/2023.  Patient is accompanied today by his Child psychotherapist / group home staff member.  Patient is extremely hard of hearing, and his hearing aid is not working properly today, so history today is provided by his accompanying staff member.  He continues to remain off of anticoagulation due to frequent falls and low platelet counts.  He has history of portal vein thrombus in the setting of advanced cirrhosis.  Denies any additional nosebleeds since his last visit.  He has fallen a few times.  Appetite is 100% energy levels are very low.  He has been falling asleep throughout the day.  Caregiver reports he has gained about 40 pounds over the past 6 to 7 months.  Reports right upper quadrant pain intermittently.  Denies early satiety.  Reports dizzy spells when he stands.  Has trouble falling and staying asleep.  He is blind in his right eye and left eye is slightly deteriorating.  He was recently given steroid eyedrops which do not appear to be helping.  Reports new onset palpitations that happen several times per day accompanied with shortness of breath.  States they are more frequent and caregiver reports increased lethargy and frequent napping throughout the day.  He denies any melena, hematochezia or bright red blood per rectum.  He quit smoking about 6 months ago.  Reports he does not like taking lactulose but he takes it daily.  REVIEW OF SYSTEMS:  Review of Systems  Constitutional:  Positive for fatigue.   HENT:   Positive for hearing loss.   Eyes:  Positive for eye problems.  Respiratory:  Positive for shortness of breath. Negative for cough.   Gastrointestinal:  Positive for abdominal pain. Negative for blood in stool.  Genitourinary:  Negative for hematuria.   Musculoskeletal:  Positive for gait problem.  Neurological:  Positive for dizziness, gait problem and headaches.  Hematological:  Negative for adenopathy.  Psychiatric/Behavioral:  Positive for confusion and sleep disturbance.       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   Cirrhosis (HCC)    diagnosed 07/2010 when presented with first episode of hepatic encephalopathy, afp on 416/13=4.8,  U/S on 12/14/11  liver stable, pt has received 2 hep A/B vaccines   COPD (chronic obstructive pulmonary disease) (HCC)    Dementia (HCC)  Depression    Diabetes mellitus    Diverticulosis    Dyspnea    GERD (gastroesophageal reflux disease)    Hard of hearing    Hematemesis 12/07/2011   Hepatic encephalopathy (HCC)    Hepatitis    Hyperlipidemia    Hypertension    Lymphedema    R>L   Mental retardation    Noncompliance    Seizures (HCC)    Thrombocytopenia (HCC)    Past Surgical History:  Procedure Laterality Date   CAST APPLICATION  08/30/2011   Procedure: MINOR CAST APPLICATION;  Surgeon: Fuller Canada, MD;  Location: [facility];  Service: Orthopedics;  Laterality: Right;  no anesthesia  , procedure room do not need or room !   CATARACT EXTRACTION W/PHACO Left 03/02/2021   Procedure: CATARACT EXTRACTION PHACO AND INTRAOCULAR LENS PLACEMENT LEFT EYE;  Surgeon: Fabio Pierce, MD;  Location: [facility];  Service: Ophthalmology;  Laterality: Left;  CDE   37.66   COLONOSCOPY  12/15/11   colonic divericulosis;poor prep, ACBE  recommended but has not been done   COLONOSCOPY WITH PROPOFOL N/A 11/07/2017   Dr. Jena Gauss: inadequate prep, internal hemorrhoids. Early interval due March 2020 due to poor prep   ear operations     ESOPHAGOGASTRODUODENOSCOPY  02/02/2011   Rourk-Normal esophagus.  No varices/Question mild portal gastropathy, extrinsic compression on the antrum, lesser curvature of uncertain significance, some minimally  nodular mucosa status post biopsy, patent pylorus, normal duodenum 1 and duodenum 2.   ESOPHAGOGASTRODUODENOSCOPY  12/15/11   Rourk-->mild changes of portal gastropathy, gastric/duodenal erosions s/p bx  (reactive changes with mild chronic inflammation. No H.pylori   ESOPHAGOGASTRODUODENOSCOPY (EGD) WITH PROPOFOL N/A 11/07/2017   Dr. Jena Gauss: normal esophagus s/p dilation, portal hypertensive gastropathy, erosive gastropathy, normal duodenum   FOOT SURGERY     MALONEY DILATION N/A 11/07/2017   Procedure: MALONEY DILATION;  Surgeon: Corbin Ade, MD;  Location: [facility];  Service: Endoscopy;  Laterality: N/A;   ORIF ANKLE FRACTURE  08/27/2011   Procedure: OPEN REDUCTION INTERNAL FIXATION (ORIF) ANKLE FRACTURE;  Surgeon: Fuller Canada, MD;  Location: [facility];  Service: Orthopedics;  Laterality: Right;   TONSILLECTOMY       SOCIAL HISTORY:  Social History   Socioeconomic History   Marital status: Married    Spouse name: Not on file   Number of children: 0   Years of education: Not on file   Highest education level: Not on file  Occupational History   Occupation: disabled    Employer: UNEMPLOYED  Tobacco Use   Smoking status: Some Days    Current packs/day:  1.00    Average packs/day: 1 pack/day for 30.0 years (30.0 ttl pk-yrs)    Types: Cigarettes   Smokeless tobacco: Never   Tobacco comments:    only smokes about 1-2 cigarettes a day.  Doesn't want to quit.  Vaping Use   Vaping status: Never Used  Substance and Sexual Activity   Alcohol use: No   Drug use: No   Sexual activity: Not on file  Other Topics Concern   Not on file  Social History Narrative   Not on file   Social Drivers of Health   Financial Resource Strain: Low Risk  (04/07/2020)   Received from [facility], [facility]   Overall Financial Resource Strain (CARDIA)    Difficulty of Paying Living Expenses: Not very hard  Food Insecurity: Low Risk  (05/18/2023)   Received from [facility]   Hunger Vital Sign  Worried About Programme researcher, broadcasting/film/video in the Last Year: Never true    Ran Out of Food in the Last Year: Never true  Transportation Needs: No Transportation Needs (05/18/2023)   Received from [facility]    In the past 12 months, has lack of reliable transportation kept you from medical appointments, meetings, work or from getting things needed for daily living? : No  Physical Activity: Inactive (04/07/2020)   Received from [facility], [facility]   Exercise Vital Sign    Days of Exercise per Week: 0 days    Minutes of Exercise per Session: 0 min  Stress: No Stress Concern Present (10/26/2021)   Received from [facility], [facility] - Occupational Stress Questionnaire    Feeling of Stress : Not at all  Social Connections: Unknown (12/28/2021)   Received from [facility], [facility]   Social Network    Social Network: Not on file  Intimate Partner Violence: Unknown (11/25/2021)   Received from [facility], [facility]   HITS    Physically Hurt: Not on file    Insult or Talk Down To: Not on file    Threaten Physical Harm: Not on file    Scream or Curse: Not on file    FAMILY  HISTORY:  Family History  Problem Relation Age of Onset   Heart failure Mother    Thyroid disease Mother    Cancer Father    Asthma Brother    Heart attack Brother    Cirrhosis Brother        EtOH   Colon cancer Maternal Aunt     CURRENT MEDICATIONS:  Outpatient Encounter Medications as of 09/23/2023  Medication Sig Note   acetaminophen (TYLENOL) 325 MG tablet Take 650 mg by mouth every 8 (eight) hours as needed for moderate pain or fever (temperature 101 and greater).    ALPRAZolam (XANAX) 0.5 MG tablet Take by mouth.    ammonium lactate (AMLACTIN) 12 % cream Apply 1 application topically daily as needed for dry skin. Apply to right leg    bisacodyl (DULCOLAX) 5 MG EC tablet Take 5 mg by mouth daily.    Calcium Carbonate (CALCIUM 500 PO) Take 500 mg by mouth in the morning and at bedtime.    ezetimibe (ZETIA) 10 MG tablet Take 10 mg by mouth daily.    furosemide (LASIX) 20 MG tablet Take 1 tablet (20 mg total) by mouth daily. (Patient taking differently: Take 10 mg by mouth in the morning.)    furosemide (LASIX) 40 MG tablet Take 40 mg by mouth daily. 06/11/2021: Last dose scheduled for 06/12/2021   gabapentin (NEURONTIN) 100 MG capsule Take 200-400 mg by mouth See admin instructions. Take 200 mg by mouth in the morning and 400 mg at bedtime    ibuprofen (ADVIL) 400 MG tablet Take by mouth.    isosorbide dinitrate (ISORDIL) 10 MG tablet Take 10 mg by mouth 2 (two) times daily.    lactulose (CHRONULAC) 10 GM/15ML solution Take 60 mLs (40 g total) by mouth 3 (three) times daily. Titrate for 3-4 bowel motions daily. (Patient taking differently: Take 40 g by mouth 3 (three) times daily.)    melatonin 3 MG TABS tablet Take 6 mg by mouth at bedtime.    Menthol, Topical Analgesic, 4 % GEL Apply 1 application topically 3 (three) times daily as needed (pain).    metoprolol tartrate (LOPRESSOR) 25 MG tablet Take 25  mg by mouth 2 (two) times daily.    omeprazole (PRILOSEC) 20 MG capsule Take 20  mg by mouth in the morning.    polyethylene glycol-electrolytes (NULYTELY) 420 g solution As directed    potassium chloride SA (K-DUR) 20 MEQ tablet Take 1 tablet (20 mEq total) by mouth daily. (Patient taking differently: Take 20 mEq by mouth in the morning.)    QUEtiapine (SEROQUEL) 50 MG tablet Take 50 mg by mouth at bedtime.    rifaximin (XIFAXAN) 550 MG TABS tablet Take 1 tablet (550 mg total) by mouth 2 (two) times daily.    sertraline (ZOLOFT) 25 MG tablet Take 25 mg by mouth daily.    traMADol (ULTRAM) 50 MG tablet Take 25 mg by mouth 2 (two) times daily.    No facility-administered encounter medications on file as of 09/23/2023.    ALLERGIES:  Allergies  Allergen Reactions   Penicillins Rash    Has patient had a PCN reaction causing immediate rash, facial/tongue/throat swelling, SOB or lightheadedness with hypotension: Yes Has patient had a PCN reaction causing severe rash involving mucus membranes or skin necrosis: No Has patient had a PCN reaction that required hospitalization No Has patient had a PCN reaction occurring within the last 10 years: No If all of the above answers are "NO", then may proceed with Cephalosporin use.      PHYSICAL EXAM:  ECOG PERFORMANCE STATUS: 1 - Symptomatic but completely ambulatory  There were no vitals filed for this visit.  There were no vitals filed for this visit. Physical Exam Constitutional:      Appearance: Normal appearance. He is obese.  Cardiovascular:     Rate and Rhythm: Normal rate and regular rhythm.  Pulmonary:     Effort: Pulmonary effort is normal.     Breath sounds: Normal breath sounds.  Abdominal:     General: Bowel sounds are normal. There is distension.     Palpations: Abdomen is soft.  Musculoskeletal:        General: No swelling. Normal range of motion.  Neurological:     Mental Status: He is alert and oriented to person, place, and time. Mental status is at baseline.      LABORATORY DATA:  I have  reviewed the labs as listed.  CBC    Component Value Date/Time   WBC 2.2 (L) 07/30/2021 0912   RBC 3.69 (L) 07/30/2021 0912   HGB 13.4 07/30/2021 0912   HGB 14.5 11/13/2008 0915   HCT 38.8 (L) 07/30/2021 0912   HCT 28 04/06/2012 1550   PLT 35 (L) 07/30/2021 0912   PLT 97 (L) 11/13/2008 0915   MCV 105.1 (H) 07/30/2021 0912   MCV 88.0 04/06/2012 1550   MCH 36.3 (H) 07/30/2021 0912   MCHC 34.5 07/30/2021 0912   RDW 13.8 07/30/2021 0912   RDW 13.1 11/13/2008 0915   LYMPHSABS 0.6 (L) 07/30/2021 0912   LYMPHSABS 2.3 11/13/2008 0915   MONOABS 0.4 07/30/2021 0912   MONOABS 0.4 11/13/2008 0915   EOSABS 0.2 07/30/2021 0912   EOSABS 0.2 11/13/2008 0915   BASOSABS 0.0 07/30/2021 0912   BASOSABS 0.0 11/13/2008 0915      Latest Ref Rng & Units 07/30/2021    9:12 AM 06/15/2021    8:09 AM 03/30/2021    8:48 AM  CMP  Glucose 70 - 99 mg/dL 440   347   BUN 8 - 23 mg/dL 10   7   Creatinine 4.25 - 1.24 mg/dL 9.56  3.87  0.79   Sodium 135 - 145 mmol/L 136   139   Potassium 3.5 - 5.1 mmol/L 4.1   3.6   Chloride 98 - 111 mmol/L 108   108   CO2 22 - 32 mmol/L 23   25   Calcium 8.9 - 10.3 mg/dL 8.6   8.8   Total Protein 6.5 - 8.1 g/dL 6.1   6.3   Total Bilirubin 0.3 - 1.2 mg/dL 3.0   2.5   Alkaline Phos 38 - 126 U/L 70   87   AST 15 - 41 U/L 51   47   ALT 0 - 44 U/L 22   21     DIAGNOSTIC IMAGING:  I have independently reviewed the relevant imaging and discussed with the patient.  ASSESSMENT & PLAN: 1.  Chronic portal vein thrombosis  - Referred by GI team Ermalinda Memos, PA-C) on 06/17/2021 for hematology input on chronic portal vein thrombosis and whether or not patient should be anticoagulated - CT abdomen/pelvis (06/15/2021): Hepatic cirrhosis noted.  Suspected chronic occlusion of portal vein with large collateral veins in the left upper quadrant and left retroperitoneal region consistent with portal hypertension. - Patient has not had any other history of thrombotic events such as PE  or DVT.  - History of frequent epistaxis although this is improved over the last year. - Currently not on anticoagulation given risk first benefit.  Platelet count remains low less than 50 and he is a high fall risk.  Discussed again with patient and caregiver that the goal of anticoagulation would be to prevent recurrent thrombus, prevent thrombus extension and promote recanalization. - Patient has moderate risk of recurrent thrombosis and thrombus extension, given his underlying cirrhosis. - We will continue to monitor his labs every 4 months and he will remain off anticoagulation for now.  2.  Thrombocytopenia, moderate to severe: - Review of EMR moderate shows moderate thrombocytopenia since 2011 - platelets have been ranging from 30-60 over the past year. - Prior imaging including CTAP from 2012 and ultrasound of the abdomen from 03/27/2018 shows moderate splenomegaly. - He reportedly has hep C, was treated for it.  Has a known diagnosis of cirrhosis, is on Xifaxan and lactulose. 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 -Laboratory work-up reviewed from [facility] clinical-platelet count is 41,000.  Previously 46,000. - Differential diagnosis favors thrombocytopenia secondary to liver disease and splenomegaly; unable to rule out MDS at this time, will consider bone marrow biopsy/splenic artery embolization if platelets consistently or below 30,000 or if significant deviation from baseline.   3.  Macrocytic anemia: - Intermittent episodes of mild anemia with persistent macrocytosis secondary to liver disease - EGD (11/07/2017): Portal hypertensive gastropathy with erosive  gastropathy - Colonoscopy (11/07/2017): Internal hemorrhoids, otherwise normal colon (limited by poor prep) -Hematology work-up (01/15/2021): Normal ferritin, iron; negative RF and ANA; normal copper, folate, methylmalonic acid, B12; SPEP negative for M spike (polyclonal increase in gamma globulin indicative of hypergammaglobulinemia which is found in a variety of disease states) - LDH elevated at 309, reticulocytes elevated at 3.2%; bilirubin elevated at 2.7 on 08/31/23. - Labs from 09/02/23 show a hemoglobin of 13.5 with MCV of 102.5.  Platelet count is 41,000. - No significant  anemia at this time.  Macrocytosis is likely due to liver disease. -Will continue to monitor.   4.  Leukopenia: - He has occasional mild leukopenia with neutropenia. - He is also on number of medications which could lower his white count.  Splenomegaly can also contribute to leukopenia.  He does not have recurrent infections or B symptoms.   - Work-up above did not reveal any nutritional deficiencies - CBC 09/02/23 showed a white count of 3.7 with an absolute neutrophil count of 1.6.  Discussed neutropenic precautions.  No recent infections. -We will continue to monitor at this time.  5. Palpitations: -New onset shortness of breath and palpitations over the past few months. -Upon chart review, no history of cardiac disease or atrial fibrillation. -Patient would like to be referred to cardiology. -Referral made.   6.  Social/family history: - Patient resides at the [facility] in [place] - [facility] is also significant for history of hemorrhage of the brain as well as blood clots. He has a deformed right ankle from a previous injury. 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 He reports that he has COPD.  He has known cirrhosis of the liver. - He used to work in a [employer].  He smoked for 43 years, 1 and half pack per day and quit 6 months ago. - Father had cancer  of the brain.  Mother had breast cancer and tongue cancer.  Brother had cancer of the liver.  His mother siblings also had cancer.  PLAN SUMMARY: >> RTC in 4 months for follow-up with lab work a few days before from [facility] clinical.  Recommend CBC, CMP and LDH.  >> Referral to cardiology    All questions were answered. The patient knows to call the clinic with any problems, questions or concerns.  Medical decision making: Moderate  Time spent on visit: I spent 25 minutes dedicated to the care of this patient (face-to-face and non-face-to-face) on the date of the encounter to include what is described in the assessment and plan.   Mauro Kaufmann, NP  07/30/2021 10:55 AM

## 2023-09-23 ENCOUNTER — Inpatient Hospital Stay: Payer: Medicare Other | Attending: Oncology | Admitting: Oncology

## 2023-09-23 VITALS — BP 140/76 | HR 58 | Temp 98.0°F | Resp 19

## 2023-09-23 DIAGNOSIS — R002 Palpitations: Secondary | ICD-10-CM

## 2023-09-23 DIAGNOSIS — D61818 Other pancytopenia: Secondary | ICD-10-CM | POA: Insufficient documentation

## 2023-09-23 DIAGNOSIS — I81 Portal vein thrombosis: Secondary | ICD-10-CM | POA: Insufficient documentation

## 2023-11-26 ENCOUNTER — Emergency Department (HOSPITAL_COMMUNITY)
Admission: EM | Admit: 2023-11-26 | Discharge: 2023-11-26 | Disposition: A | Discharge status: Skilled Nursing Facility | Attending: Emergency Medicine | Admitting: Emergency Medicine

## 2023-11-26 ENCOUNTER — Emergency Department (HOSPITAL_COMMUNITY)

## 2023-11-26 ENCOUNTER — Other Ambulatory Visit: Payer: Self-pay

## 2023-11-26 ENCOUNTER — Encounter (HOSPITAL_COMMUNITY): Payer: Self-pay | Admitting: *Deleted

## 2023-11-26 DIAGNOSIS — Y92002 Bathroom of unspecified non-institutional (private) residence single-family (private) house as the place of occurrence of the external cause: Secondary | ICD-10-CM | POA: Diagnosis not present

## 2023-11-26 DIAGNOSIS — N3000 Acute cystitis without hematuria: Secondary | ICD-10-CM | POA: Insufficient documentation

## 2023-11-26 DIAGNOSIS — W1839XA Other fall on same level, initial encounter: Secondary | ICD-10-CM | POA: Insufficient documentation

## 2023-11-26 DIAGNOSIS — J449 Chronic obstructive pulmonary disease, unspecified: Secondary | ICD-10-CM | POA: Diagnosis not present

## 2023-11-26 DIAGNOSIS — F0390 Unspecified dementia without behavioral disturbance: Secondary | ICD-10-CM | POA: Diagnosis not present

## 2023-11-26 DIAGNOSIS — Z79899 Other long term (current) drug therapy: Secondary | ICD-10-CM | POA: Insufficient documentation

## 2023-11-26 DIAGNOSIS — H44521 Atrophy of globe, right eye: Secondary | ICD-10-CM | POA: Insufficient documentation

## 2023-11-26 DIAGNOSIS — I10 Essential (primary) hypertension: Secondary | ICD-10-CM | POA: Diagnosis not present

## 2023-11-26 DIAGNOSIS — Z7984 Long term (current) use of oral hypoglycemic drugs: Secondary | ICD-10-CM | POA: Insufficient documentation

## 2023-11-26 DIAGNOSIS — E119 Type 2 diabetes mellitus without complications: Secondary | ICD-10-CM | POA: Diagnosis not present

## 2023-11-26 DIAGNOSIS — R296 Repeated falls: Secondary | ICD-10-CM | POA: Insufficient documentation

## 2023-11-26 LAB — CBC WITH DIFFERENTIAL/PLATELET
Abs Immature Granulocytes: 0.01 10*3/uL (ref 0.00–0.07)
Basophils Absolute: 0.1 10*3/uL (ref 0.0–0.1)
Basophils Relative: 2 %
Eosinophils Absolute: 0.3 10*3/uL (ref 0.0–0.5)
Eosinophils Relative: 6 %
HCT: 44.1 % (ref 39.0–52.0)
Hemoglobin: 14.8 g/dL (ref 13.0–17.0)
Immature Granulocytes: 0 %
Lymphocytes Relative: 20 %
Lymphs Abs: 0.9 10*3/uL (ref 0.7–4.0)
MCH: 35.2 pg — ABNORMAL HIGH (ref 26.0–34.0)
MCHC: 33.6 g/dL (ref 30.0–36.0)
MCV: 104.8 fL — ABNORMAL HIGH (ref 80.0–100.0)
Monocytes Absolute: 0.6 10*3/uL (ref 0.1–1.0)
Monocytes Relative: 13 %
Neutro Abs: 2.5 10*3/uL (ref 1.7–7.7)
Neutrophils Relative %: 59 %
Platelets: 51 10*3/uL — ABNORMAL LOW (ref 150–400)
RBC: 4.21 MIL/uL — ABNORMAL LOW (ref 4.22–5.81)
RDW: 14.1 % (ref 11.5–15.5)
WBC: 4.3 10*3/uL (ref 4.0–10.5)
nRBC: 0 % (ref 0.0–0.2)

## 2023-11-26 LAB — COMPREHENSIVE METABOLIC PANEL WITH GFR
ALT: 23 U/L (ref 0–44)
AST: 32 U/L (ref 15–41)
Albumin: 4.4 g/dL (ref 3.5–5.0)
Alkaline Phosphatase: 46 U/L (ref 38–126)
Anion gap: 12 (ref 5–15)
BUN: 10 mg/dL (ref 8–23)
CO2: 25 mmol/L (ref 22–32)
Calcium: 9.7 mg/dL (ref 8.9–10.3)
Chloride: 103 mmol/L (ref 98–111)
Creatinine, Ser: 0.72 mg/dL (ref 0.61–1.24)
GFR, Estimated: >60 mL/min (ref >60)
Glucose, Bld: 103 mg/dL — ABNORMAL HIGH (ref 70–99)
Potassium: 4.5 mmol/L (ref 3.5–5.1)
Sodium: 140 mmol/L (ref 135–145)
Total Bilirubin: 0.6 mg/dL (ref 0.0–1.2)
Total Protein: 8 g/dL (ref 6.5–8.1)

## 2023-11-26 LAB — URINALYSIS, ROUTINE W REFLEX MICROSCOPIC
Bacteria, UA: NONE SEEN
Bilirubin Urine: NEGATIVE
Glucose, UA: NEGATIVE mg/dL
Ketones, ur: NEGATIVE mg/dL
Nitrite: POSITIVE — AB
Protein, ur: 30 mg/dL — AB
Specific Gravity, Urine: 1.011 (ref 1.005–1.030)
pH: 6 (ref 5.0–8.0)

## 2023-11-26 LAB — RESP PANEL BY RT-PCR (RSV, FLU A&B, COVID)  RVPGX2
Influenza A by PCR: NEGATIVE
Influenza B by PCR: NEGATIVE
Resp Syncytial Virus by PCR: NEGATIVE
SARS Coronavirus 2 by RT PCR: NEGATIVE

## 2023-11-26 LAB — CBG MONITORING, ED: Glucose-Capillary: 124 mg/dL — ABNORMAL HIGH (ref 70–99)

## 2023-11-26 LAB — MAGNESIUM: Magnesium: 2 mg/dL (ref 1.7–2.4)

## 2023-11-26 MED ORDER — SULFAMETHOXAZOLE-TRIMETHOPRIM 800-160 MG PO TABS
1.0000 | ORAL_TABLET | Freq: Once | ORAL | Status: AC
  Administered 2023-11-26: 1 via ORAL
  Filled 2023-11-26: qty 1

## 2023-11-26 MED ORDER — SULFAMETHOXAZOLE-TRIMETHOPRIM 800-160 MG PO TABS: 1.0000 | ORAL_TABLET | Freq: Two times a day (BID) | ORAL | 0 refills | Status: AC

## 2023-11-26 NOTE — ED Provider Notes (Signed)
 Fall at [facility] - labs and back to NH - ? AMS  but NH states seems normal to them. Imaging neg  Labs with likely infection, will add culture, start Bactrim,   Stable for discharge, vital signs unremarkable   Eber Hong, MD 11/26/23 0930

## 2023-11-26 NOTE — ED Provider Notes (Signed)
 [place] EMERGENCY DEPARTMENT AT [facility] Provider Note   CSN: [id number] Arrival date & time: 11/26/23  0456     History  Chief Complaint  Patient presents with   [name] [name] is a 63 y.o. male with PMH as listed below who presents BIB [facility] from [facility] [place] for c/o multiple falls over the last couple of days. Pt has dementia and is poor historian at baseline, but patient able to tell me that he fell in the bathroom today and hit his head but denies LOC. He denies pain anywhere.    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   Cirrhosis (HCC)    diagnosed 07/2010 when presented with first episode of hepatic encephalopathy, afp on 416/13=4.8,  U/S on 12/14/11  liver stable, pt has received 2 hep A/B vaccines   COPD (chronic obstructive pulmonary disease) (HCC)    Dementia (HCC)    Depression    Diabetes mellitus    Diverticulosis    Dyspnea    GERD (gastroesophageal reflux disease)    Hard of hearing    Hematemesis 12/07/2011   Hepatic encephalopathy (HCC)    Hepatitis    Hyperlipidemia    Hypertension    Lymphedema    R>L   Mental retardation    Noncompliance    Seizures (HCC)    Thrombocytopenia (HCC)        Home Medications Prior to Admission medications   Medication Sig Start Date End Date Taking? Authorizing Provider  acetaminophen (TYLENOL) 325 MG tablet Take 650 mg by mouth every 8 (eight) hours as needed for moderate  pain or fever (temperature 101 and greater).    [provider]  aluminum-magnesium hydroxide 200-200 MG/5ML suspension Take 10 mLs by mouth as needed for indigestion (Every 8 hrs PRN for indigestion).    [provider]  bisacodyl (DULCOLAX) 5 MG EC tablet Take 5 mg by mouth daily.    [provider]  busPIRone (BUSPAR) 7.5 MG tablet Take 7.5 mg by mouth 2 (two) times daily.    [provider]  diphenhydrAMINE (BENADRYL) 25 mg capsule Take 25 mg by mouth every 6 (six) hours as needed for itching.    [provider]  furosemide (LASIX) 20 MG tablet Take 1 tablet (20 mg total) by mouth daily. Patient taking differently: Take 10 mg by mouth in the morning. 09/11/20   Colin Dawley, MD  gabapentin (NEURONTIN) 100 MG capsule Take 200-400 mg by mouth See admin instructions. Take 200 mg by mouth in the morning and 400 mg at bedtime 03/07/21   [provider]  hydrOXYzine (ATARAX) 10 MG tablet Take 10 mg by mouth in the morning and at bedtime.    [provider]  lactulose (CHRONULAC) 10 GM/15ML solution  Take 60 mLs (40 g total) by mouth 3 (three) times daily. Titrate for 3-4 bowel motions daily. Patient taking differently: Take 40 g by mouth 3 (three) times daily. 09/11/20   Colin Dawley, MD  loratadine (CLARITIN) 10 MG tablet Take 10 mg by mouth daily.    [provider]  melatonin 3 MG TABS tablet Take 6 mg by mouth at bedtime.    [provider]  Menthol, Topical Analgesic, 4 % GEL Apply 1 application topically 3 (three) times daily as needed (pain).    [provider]  metoprolol tartrate (LOPRESSOR) 25 MG tablet Take 25 mg by mouth 2 (two) times daily. 06/02/20   [provider]  OLANZapine (ZYPREXA) 7.5 MG tablet Take 7.5 mg by mouth at bedtime.    [provider]  omeprazole (PRILOSEC) 20 MG capsule Take 20 mg by mouth in the morning.    [provider]  polyethylene  glycol-electrolytes (NULYTELY) 420 g solution As directed 04/30/21   Rourk, Windsor Hatcher, MD  potassium chloride SA (K-DUR) 20 MEQ tablet Take 1 tablet (20 mEq total) by mouth daily. Patient taking differently: Take 20 mEq by mouth in the morning. 01/23/19   Colin Dawley, MD  rifaximin (XIFAXAN) 550 MG TABS tablet Take 1 tablet (550 mg total) by mouth 2 (two) times daily. 09/11/20   Colin Dawley, MD  sertraline (ZOLOFT) 25 MG tablet Take 25 mg by mouth daily.    [provider]  traMADol (ULTRAM) 50 MG tablet Take 25 mg by mouth 2 (two) times daily. 08/17/22   [provider]  traZODone (DESYREL) 50 MG tablet Take 12.5 mg by mouth at bedtime.    [provider]      Allergies    Penicillins    Review of Systems   Review of Systems A 10 point review of systems was performed and is negative unless otherwise reported in HPI.  Physical Exam Updated Vital Signs BP (!) 151/94   Pulse 61   Temp (!) 97 F (36.1 C) (Axillary)   Resp 10   SpO2 95%  Physical Exam General: Normal appearing male, lying in bed.  HEENT: Sclera anicteric, MMM, trachea midline.  Cardiology: RRR, no murmurs/rubs/gallops.  Resp: Normal respiratory rate and effort. CTAB, no wheezes, rhonchi, crackles.  Abd: Soft, non-tender, non-distended. No rebound tenderness or guarding.  Pelvis: Pelvis stable/non-tender. MSK: No peripheral edema or signs of trauma. R ankle with apparent deformity but no TTP or swelling. Intact BL DP/PT pulses. Extremities without TTP. No cyanosis or clubbing. Skin: warm, dry. No rashes or lesions. Back: No CVA tenderness. No midline C T or L spine TTP or stepoffs. Neuro: A&Ox1, CNs II-XII grossly intact. MAEs. Sensation grossly intact.  Psych: Normal mood and affect.   ED Results / Procedures / Treatments   Labs (all labs ordered are listed, but only abnormal results are displayed) Labs Reviewed  CBC WITH DIFFERENTIAL/PLATELET - Abnormal; Notable for the following  components:   RBC 4.21 (*)    MCV 104.8 (*)    MCH 35.2 (*)    Platelets 51 (*)    All other components within normal limits  COMPREHENSIVE METABOLIC PANEL WITH GFR - Abnormal; Notable for the following components:   Glucose, Bld 103 (*)    All other components within normal limits  URINALYSIS, ROUTINE W REFLEX MICROSCOPIC - Abnormal; Notable for the following components:   APPearance HAZY (*)    Hgb urine dipstick MODERATE (*)    Protein, ur  30 (*)    Nitrite POSITIVE (*)    Leukocytes,Ua SMALL (*)    All other components within normal limits  CBG MONITORING, ED - Abnormal; Notable for the following components:   Glucose-Capillary 124 (*)    All other components within normal limits  RESP PANEL BY RT-PCR (RSV, FLU A&B, COVID)  RVPGX2  MAGNESIUM    EKG EKG Interpretation Date/Time:  Saturday November 26 2023 05:07:10 EDT Ventricular Rate:  61 PR Interval:  169 QRS Duration:  101 QT Interval:  484 QTC Calculation: 488 R Axis:   74  Text Interpretation: Sinus rhythm Borderline T abnormalities, anterior leads Borderline prolonged QT interval Confirmed by Townsend Freud [phone]) on 11/27/2023 1:44:21 PM  Radiology No results found.  Procedures Procedures    Medications Ordered in ED   ED Course/ Medical Decision Making/ A&P                          Medical Decision Making Amount and/or Complexity of Data Reviewed Labs: ordered. Decision-making details documented in ED Course. Radiology: ordered. Decision-making details documented in ED Course.  Risk Prescription drug management.    This patient presents to the ED for concern of fall, hit head, this involves an extensive number of treatment options, and is a complaint that carries with it a high risk of complications and morbidity.  I considered the following differential and admission for this acute, potentially life threatening condition.   MDM:    DDX for trauma includes but is not limited to:  -Head Injury  such as skull fx or ICH - neg CTH -Chest Injury and Abdominal Injury - CXR/PXR neg -Vertebral injury - no midline tenderness, CT c-spine is neg -Fractures - deformity of R ankle is chronic, no acute fracture  With multiple reported falls over last couple of days, consider electrolyte derangements or infection such as PNA or UTI. No fever or signs of sepsis at this time. Per chart review/EMS, he is apparently at mental status baseline. EKG w/o arrhythmia or signs of ischemia. CBG okay.  Clinical Course as of 12/05/23 1850  Sat Nov 26, 2023  0647 Glucose-Capillary(!): 124 [HN]  6045 DG Ankle 2 Views Right Severe chronic deformity of the right ankle, hindfoot. No acute osseous abnormality identified.   [HN]  [id number] DG Knee 2 Views Right No acute fracture or dislocation identified about the right knee. [HN]  [id number] DG Pelvis Portable No acute fracture or dislocation identified about the pelvis. [HN]  [id number] DG Chest Portable 1 View No acute cardiopulmonary abnormality or acute traumatic injury identified.   [HN]  [id number] CT Cervical Spine Wo Contrast 1. Motion artifact. No acute traumatic injury identified in the cervical spine.  2. 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   [HN]  4098 CT Head Wo Contrast 1. No acute traumatic injury identified. 2. Stable and negative for age noncontrast CT appearance of the brain. 3. Chronic bilateral mastoidectomy.  Right phthisis bulbi.   [HN]    Clinical Course User Index [HN] Merdis Stalling, MD    Labs: I Ordered, and personally interpreted labs.  The pertinent results include:  those listed above  Imaging Studies ordered: I ordered imaging studies including CTH, CT C-spine, CXR, PXR, extremity XRs I independently visualized and interpreted imaging. I agree with the radiologist  interpretation  Additional history obtained from chart review, EMS.   Social Determinants of Health: Lives at SNF  Disposition:  Patient is signed out to the oncoming ED physician Dr. Annabell Key who is made aware of her history, presentation, exam, workup, and plan.    Co morbidities that complicate the patient evaluation  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   Cirrhosis (HCC)    diagnosed 07/2010 when presented with first episode of hepatic encephalopathy, afp on 416/13=4.8,  U/S on 12/14/11  liver stable, pt has received 2 hep A/B vaccines   COPD (chronic obstructive pulmonary disease) (HCC)    Dementia (HCC)    Depression    Diabetes mellitus    Diverticulosis    Dyspnea    GERD (gastroesophageal reflux disease)    Hard of hearing    Hematemesis 12/07/2011   Hepatic encephalopathy (HCC)    Hepatitis    Hyperlipidemia    Hypertension    Lymphedema    R>L   Mental retardation    Noncompliance    Seizures (HCC)    Thrombocytopenia (HCC)      Medicines Meds ordered this encounter  Medications   sulfamethoxazole-trimethoprim (BACTRIM DS) 800-160 MG per tablet 1 tablet   sulfamethoxazole-trimethoprim (BACTRIM DS) 800-160 MG tablet    Sig: Take 1 tablet by mouth 2 (two) times daily for 7 days.    Dispense:  14 tablet    Refill:  0    I have reviewed the patients home medicines and have made adjustments as needed  Problem List / ED Course: Problem List Items  Addressed This Visit   None Visit Diagnoses       Acute cystitis without hematuria    -  Primary                   This note was created using dictation software, which may contain spelling or grammatical errors.    Merdis Stalling, MD 12/05/23 [phone]

## 2023-11-26 NOTE — Discharge Instructions (Signed)
 X-ray revealed no signs of broken bones or trauma, urine shows possible infection, Bactrim twice a day for 7 days  Thank you for allowing [place] to treat you in the emergency department today.  After reviewing your examination and potential testing that was done it appears that you are safe to go home.  I would like for you to follow-up with your doctor within the next several days, have them obtain your records and follow-up with them to review all potential tests and results from your visit.  If you should develop severe or worsening symptoms return to the emergency department immediately

## 2023-11-26 NOTE — ED Triage Notes (Signed)
 Pt BIB [facility] from [facility] [place] for c/o multiple falls over the last couple of days  Pt has dementia and is poor historian; pt has deformity to right ankle  Pt in c-collar

## 2023-11-29 LAB — URINE CULTURE

## 2023-11-30 ENCOUNTER — Telehealth (HOSPITAL_BASED_OUTPATIENT_CLINIC_OR_DEPARTMENT_OTHER): Payer: Self-pay | Admitting: Emergency Medicine

## 2023-11-30 NOTE — Telephone Encounter (Signed)
 Post ED Visit - Positive Culture Follow-up  Culture report reviewed by antimicrobial stewardship pharmacist: [facility] []  Enzo Bi, Pharm.D. []  Celedonio Miyamoto, Pharm.D., BCPS AQ-ID []  Garvin Fila, Pharm.D., BCPS []  Georgina Pillion, [address].D., BCPS []  [place], [address].D., BCPS, AAHIVP []  Estella Husk, Pharm.D., BCPS, AAHIVP []  Lysle Pearl, PharmD, BCPS []  Phillips Climes, PharmD, BCPS []  Agapito Games, PharmD, BCPS [x]  Lora Paula, PharmD []  Mervyn Gay, PharmD, BCPS []  Vinnie Level, PharmD  [facility] []  Len Childs, PharmD []  Greer Pickerel, PharmD []  Adalberto Cole, PharmD []  Perlie Gold, Rph []  Lonell Face) Jean Rosenthal, PharmD []  Earl Many, PharmD []  Junita Push, PharmD []  Dorna Leitz, PharmD []  Terrilee Files, PharmD []  Lynann Beaver, PharmD []  Keturah Barre, PharmD []  Loralee Pacas, PharmD []  Bernadene Person, PharmD   Positive urine culture Treated with sulfamethoxazole-trimethoprim, organism sensitive to the same and no further patient follow-up is required at this time.  Tanna Savoy Danne Scardina 11/30/2023, 3:21 PM

## 2023-12-08 ENCOUNTER — Ambulatory Visit: Payer: Medicare Other | Admitting: Cardiology

## 2023-12-13 ENCOUNTER — Ambulatory Visit: Attending: Cardiology | Admitting: Cardiology

## 2023-12-13 ENCOUNTER — Other Ambulatory Visit: Payer: Self-pay | Admitting: Cardiology

## 2023-12-13 ENCOUNTER — Encounter: Payer: Self-pay | Admitting: Cardiology

## 2023-12-13 ENCOUNTER — Ambulatory Visit

## 2023-12-13 VITALS — BP 120/70 | HR 60 | Ht 67.0 in | Wt 204.8 lb

## 2023-12-13 DIAGNOSIS — R002 Palpitations: Secondary | ICD-10-CM | POA: Diagnosis present

## 2023-12-13 NOTE — Progress Notes (Signed)
 Clinical Summary Mr. Rivest is a 63 y.o.male seen today as a new consult, referred by NP Burns for the following medical problems.   1.Palpitations - history difficult to obtaine, patient is extremely hard of hearing. PMH also lists some component of dementia - he reports had been on a higher dose of lopressor  prevoiusly. Since was lowered by another provider has had some daily palpitations. He is not sure the indication for lowering the dose, I would assume due to low resting heart rates. Currently taking just lopressor  25mg  in the AM.  - daily palpitations, lasts few minutes.  - reports fairly high caffeien intake at times.   2.Cirrhosis/chronic portal vein thrombosis    3. 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   Cirrhosis (HCC)    diagnosed 07/2010 when presented with first episode of hepatic encephalopathy, afp on 416/13=4.8,  U/S on 12/14/11  liver stable, pt has received 2 hep A/B vaccines   COPD (chronic obstructive pulmonary disease) (HCC)    Dementia (HCC)    Depression    Diabetes mellitus    Diverticulosis    Dyspnea    GERD (gastroesophageal reflux disease)    Hard of hearing    Hematemesis 12/07/2011   Hepatic encephalopathy (HCC)    Hepatitis    Hyperlipidemia    Hypertension    Lymphedema    R>L   Mental retardation    Noncompliance    Seizures (HCC)    Thrombocytopenia (HCC)      Allergies  Allergen Reactions    Penicillins Rash    Has patient had a PCN reaction causing immediate rash, facial/tongue/throat swelling, SOB or lightheadedness with hypotension: Yes Has patient had a PCN reaction causing severe rash involving mucus membranes or skin necrosis: No Has patient had a PCN reaction that required hospitalization No Has patient had a PCN reaction occurring within the last 10 years: No If all of the above answers are "NO", then may proceed with Cephalosporin use.      Current Outpatient Medications  Medication Sig Dispense Refill   acetaminophen  (TYLENOL ) 325 MG tablet Take 650 mg by mouth every 8 (eight) hours as needed for moderate pain or fever (temperature 101 and greater).     aluminum-magnesium  hydroxide 200-200 MG/5ML suspension Take 10 mLs by mouth as needed for indigestion (Every 8 hrs PRN for indigestion).     bisacodyl  (DULCOLAX) 5 MG EC tablet Take 5 mg by mouth daily.     busPIRone (BUSPAR) 7.5 MG tablet Take 7.5 mg by mouth 2 (two) times daily.     diphenhydrAMINE  (BENADRYL ) 25 mg capsule Take 25 mg by mouth every 6 (six) hours as needed for itching.     furosemide  (LASIX ) 20 MG tablet Take 1 tablet (20 mg total) by  mouth daily. (Patient taking differently: Take 10 mg by mouth in the morning.) 30 tablet 1   gabapentin  (NEURONTIN ) 100 MG capsule Take 200-400 mg by mouth See admin instructions. Take 200 mg by mouth in the morning and 400 mg at bedtime     hydrOXYzine (ATARAX) 10 MG tablet Take 10 mg by mouth in the morning and at bedtime.     lactulose  (CHRONULAC ) 10 GM/15ML solution Take 60 mLs (40 g total) by mouth 3 (three) times daily. Titrate for 3-4 bowel motions daily. (Patient taking differently: Take 40 g by mouth 3 (three) times daily.) 946 mL 2   loratadine (CLARITIN) 10 MG tablet Take 10 mg by mouth daily.     melatonin 3 MG TABS tablet Take 6 mg by mouth at bedtime.     Menthol, Topical Analgesic, 4 % GEL Apply 1 application topically 3 (three) times daily as needed (pain).      metoprolol  tartrate (LOPRESSOR ) 25 MG tablet Take 25 mg by mouth 2 (two) times daily.     OLANZapine (ZYPREXA) 7.5 MG tablet Take 7.5 mg by mouth at bedtime.     omeprazole  (PRILOSEC) 20 MG capsule Take 20 mg by mouth in the morning.     polyethylene glycol-electrolytes (NULYTELY) 420 g solution As directed 4000 mL 0   potassium chloride  SA (K-DUR) 20 MEQ tablet Take 1 tablet (20 mEq total) by mouth daily. (Patient taking differently: Take 20 mEq by mouth in the morning.) 30 tablet 1   rifaximin  (XIFAXAN ) 550 MG TABS tablet Take 1 tablet (550 mg total) by mouth 2 (two) times daily. 60 tablet 3   sertraline (ZOLOFT) 25 MG tablet Take 25 mg by mouth daily.     traMADol  (ULTRAM ) 50 MG tablet Take 25 mg by mouth 2 (two) times daily.     traZODone  (DESYREL ) 50 MG tablet Take 12.5 mg by mouth at bedtime.     No current facility-administered medications for this visit.     Past Surgical History:  Procedure Laterality Date   CAST APPLICATION  08/30/2011   Procedure: MINOR CAST APPLICATION;  Surgeon: Elsa Halls, MD;  Location: [facility];  Service: Orthopedics;  Laterality: Right;  no anesthesia , procedure room do not need or room !   CATARACT EXTRACTION W/PHACO Left 03/02/2021   Procedure: CATARACT EXTRACTION PHACO AND INTRAOCULAR LENS PLACEMENT LEFT EYE;  Surgeon: Tarri Farm, MD;  Location: [facility];  Service: Ophthalmology;  Laterality: Left;  CDE   37.66   COLONOSCOPY  12/15/11   colonic divericulosis;poor prep, ACBE  recommended but has not been done   COLONOSCOPY WITH PROPOFOL  N/A 11/07/2017   Dr. Riley Cheadle: inadequate prep, internal hemorrhoids. Early interval due March 2020 due to poor prep   ear operations     ESOPHAGOGASTRODUODENOSCOPY  02/02/2011   Rourk-Normal esophagus.  No varices/Question mild portal gastropathy, extrinsic compression on the antrum, lesser curvature of uncertain significance, some minimally  nodular mucosa status post biopsy, patent pylorus, normal duodenum 1 and  duodenum 2.   ESOPHAGOGASTRODUODENOSCOPY  12/15/11   Rourk-->mild changes of portal gastropathy, gastric/duodenal erosions s/p bx  (reactive changes with mild chronic inflammation. No H.pylori   ESOPHAGOGASTRODUODENOSCOPY (EGD) WITH PROPOFOL  N/A 11/07/2017   Dr. Riley Cheadle: normal esophagus s/p dilation, portal hypertensive gastropathy, erosive gastropathy, normal duodenum   FOOT SURGERY     MALONEY DILATION N/A 11/07/2017   Procedure: MALONEY DILATION;  Surgeon: Suzette Espy, MD;  Location: [facility];  Service: Endoscopy;  Laterality: N/A;  ORIF ANKLE FRACTURE  08/27/2011   Procedure: OPEN REDUCTION INTERNAL FIXATION (ORIF) ANKLE FRACTURE;  Surgeon: Elsa Halls, MD;  Location: [facility];  Service: Orthopedics;  Laterality: Right;   TONSILLECTOMY       Allergies  Allergen Reactions   Penicillins Rash    Has patient had a PCN reaction causing immediate rash, facial/tongue/throat swelling, SOB or lightheadedness with hypotension: Yes Has patient had a PCN reaction causing severe rash involving mucus membranes or skin necrosis: No Has patient had a PCN reaction that required hospitalization No Has patient had a PCN reaction occurring within the last 10 years: No If all of the above answers are "NO", then may proceed with Cephalosporin use.       Family History  Problem Relation Age of Onset   Heart failure Mother    Thyroid  disease Mother    Cancer Father    Asthma Brother    Heart attack Brother    Cirrhosis Brother        EtOH   Colon cancer Maternal Aunt      Social History Mr. Schubach reports that he has been smoking cigarettes. He has a 30 pack-year smoking history. He has never used smokeless tobacco. Mr. Sens reports no history of alcohol use.    Physical Examination Today's Vitals   12/13/23 1134  BP: 120/70  Pulse: 60  SpO2: 96%  Weight: 204 lb 12.8 oz (92.9 kg)  Height: 5\' 7"  (1.702 m)   Body mass index is 32.08 kg/m.  Gen: resting comfortably, no acute  distress HEENT: no scleral icterus, pupils equal round and reactive, no palptable cervical adenopathy,  CV: RRR, no mrg, no jvd Resp: Clear to auscultation bilaterally GI: abdomen is soft, non-tender, non-distended, normal bowel sounds, no hepatosplenomegaly MSK: extremities are warm, no edema.  Skin: warm, no rash Neuro:  no focal deficits Psych: appropriate affect   Diagnostic Studies 01/2019 echo 1. The left ventricle has normal systolic function with an ejection  fraction of 60-65%. The cavity size was normal. Severe basal septal  hypertrophy. Otherwise, moderate hypertrophy of remaining myocardium. Left  ventricular diastolic function could not  be evaluated secondary to atrial fibrillation. No evidence of left  ventricular regional wall motion abnormalities.   2. The right ventricle has normal systolic function. The cavity was  normal. There is no increase in right ventricular wall thickness.   3. Left atrial size was mildly dilated.   4. Right atrial size was mildly dilated.   5. The mitral valve is grossly normal. Mild thickening of the mitral  valve leaflet.   6. The tricuspid valve is grossly normal.   7. The aortic valve is tricuspid. Mild thickening of the aortic valve.   8. The aortic root is normal in size and structure.   9. Pulmonary hypertension is mild.  10. The inferior vena cava was dilated in size with <50% respiratory  variability.       Assessment and Plan   1.Palpitations - history difficult to obtain, he is very hard of hearing and history also indicates some degree of dementia - baseline EKG shows SR at 61 - plan for 7 day zio patch.  - on just lopressor  25mg  in the AM, he reports dose lowered in the past I would assume due to resting low heart rates. F/u monitor and adjust medication regimen pending findings - discussed weaning caffeine.   F/u 6 weeks     Laurann Pollock, M.D.

## 2023-12-13 NOTE — Patient Instructions (Addendum)
 Medication Instructions:  Your physician recommends that you continue on your current medications as directed. Please refer to the Current Medication list given to you today.  Labwork: none  Testing/Procedures: 7 Day ZIO XT You will mail this device back to [facility]  Follow-Up: Your physician recommends that you schedule a follow-up appointment in: 6 weeks  Any Other Special Instructions Will Be Listed Below (If Applicable).  If you need a refill on your cardiac medications before your next appointment, please call your pharmacy.

## 2024-01-09 DIAGNOSIS — R002 Palpitations: Secondary | ICD-10-CM

## 2024-01-19 ENCOUNTER — Inpatient Hospital Stay: Payer: Medicare Other | Attending: Oncology | Admitting: Oncology

## 2024-01-19 VITALS — BP 146/70 | HR 57 | Temp 97.9°F | Resp 17 | Ht 64.5 in | Wt 252.2 lb

## 2024-01-19 DIAGNOSIS — D696 Thrombocytopenia, unspecified: Secondary | ICD-10-CM | POA: Diagnosis not present

## 2024-01-19 DIAGNOSIS — J449 Chronic obstructive pulmonary disease, unspecified: Secondary | ICD-10-CM | POA: Diagnosis not present

## 2024-01-19 DIAGNOSIS — Z803 Family history of malignant neoplasm of breast: Secondary | ICD-10-CM | POA: Diagnosis not present

## 2024-01-19 DIAGNOSIS — Z800 Family history of malignant neoplasm of digestive organs: Secondary | ICD-10-CM | POA: Diagnosis not present

## 2024-01-19 DIAGNOSIS — H5461 Unqualified visual loss, right eye, normal vision left eye: Secondary | ICD-10-CM | POA: Insufficient documentation

## 2024-01-19 DIAGNOSIS — Z87891 Personal history of nicotine dependence: Secondary | ICD-10-CM | POA: Insufficient documentation

## 2024-01-19 DIAGNOSIS — D61818 Other pancytopenia: Secondary | ICD-10-CM | POA: Insufficient documentation

## 2024-01-19 DIAGNOSIS — D539 Nutritional anemia, unspecified: Secondary | ICD-10-CM | POA: Insufficient documentation

## 2024-01-19 DIAGNOSIS — K7460 Unspecified cirrhosis of liver: Secondary | ICD-10-CM | POA: Diagnosis not present

## 2024-01-19 DIAGNOSIS — R002 Palpitations: Secondary | ICD-10-CM | POA: Diagnosis not present

## 2024-01-19 DIAGNOSIS — I81 Portal vein thrombosis: Secondary | ICD-10-CM | POA: Diagnosis not present

## 2024-01-19 NOTE — Progress Notes (Signed)
 [facility] [address] [place], [place] [address]   CLINIC:  Medical Oncology/Hematology  PCP:  System, Provider Not In No address on file None   REASON FOR VISIT:  Follow-up for cirrhosis-related pancytopenia and chronic portal vein thrombosis  PRIOR THERAPY: None  CURRENT THERAPY: Observation  INTERVAL HISTORY:  [name] [name] 64 y.o. male returns for routine follow-up of his pancytopenia and portal vein thrombosis.  He was last seen by me on 09/23/2023.  Patient is accompanied today by his Child psychotherapist / group home staff member ;[name] [name].  Patient is extremely hard of hearing, and his hearing aid is not working properly today, so history today is provided by his accompanying staff member.  Patient continues to remain off anticoagulation due to frequent falls and low platelet counts.  Reports he cannot see very well and has been tripping over items in his room.  They have rearranged his room several times to make it easier for him to get around.  He denies any source of bleeding.  Appetite is 100% energy levels are low.  He has 6 out of 10 abdominal and right ear pain.  Has occasional dizziness and headaches.  Has diarrhea with occasional blood due to hemorrhoids.  Reports of vomiting after lactulose .  He takes this 4 times per day and hates the way it takes.  He was referred to cardiology for palpitations.  He wore a 7-day heart monitor and has follow-up next month with Dr. Amanda Jungling.  He is blind in his right eye.  Can use steroid drops which do not appear to be helping.  Reports frequent napping which is usual for him.  REVIEW OF SYSTEMS:  Review of Systems  Gastrointestinal:  Positive for abdominal pain, blood in stool, diarrhea and vomiting.  Neurological:  Positive for dizziness and headaches.  Psychiatric/Behavioral:  Positive for sleep disturbance.       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   Cirrhosis (HCC)    diagnosed 07/2010 when presented with first episode of hepatic encephalopathy, afp on 416/13=4.8,  U/S on 12/14/11  liver stable, pt has received 2 hep A/B vaccines   COPD (chronic obstructive pulmonary disease) (HCC)    Dementia (HCC)    Depression    Diabetes mellitus    Diverticulosis    Dyspnea    GERD (gastroesophageal reflux disease)    Hard of hearing    Hematemesis 12/07/2011   Hepatic encephalopathy (HCC)    Hepatitis    Hyperlipidemia    Hypertension    Lymphedema    R>L   Mental retardation    Noncompliance    Seizures (HCC)    Thrombocytopenia (HCC)    Past Surgical History:  Procedure Laterality Date   CAST APPLICATION  08/30/2011  Procedure: MINOR CAST APPLICATION;  Surgeon: Elsa Halls, MD;  Location: [facility];  Service: Orthopedics;  Laterality: Right;  no anesthesia , procedure room do not need or room !   CATARACT EXTRACTION W/PHACO Left 03/02/2021   Procedure: CATARACT EXTRACTION PHACO AND INTRAOCULAR LENS PLACEMENT LEFT EYE;  Surgeon: Tarri Farm, MD;  Location: [facility];  Service: Ophthalmology;  Laterality: Left;  CDE   37.66   COLONOSCOPY  12/15/11   colonic divericulosis;poor prep, ACBE  recommended but has not been done   COLONOSCOPY WITH PROPOFOL  N/A 11/07/2017   Dr. Riley Cheadle: inadequate prep, internal hemorrhoids. Early interval due March 2020 due to poor prep   ear operations     ESOPHAGOGASTRODUODENOSCOPY  02/02/2011   Rourk-Normal esophagus.  No  varices/Question mild portal gastropathy, extrinsic compression on the antrum, lesser curvature of uncertain significance, some minimally  nodular mucosa status post biopsy, patent pylorus, normal duodenum 1 and duodenum 2.   ESOPHAGOGASTRODUODENOSCOPY  12/15/11   Rourk-->mild changes of portal gastropathy, gastric/duodenal erosions s/p bx  (reactive changes with mild chronic inflammation. No H.pylori   ESOPHAGOGASTRODUODENOSCOPY (EGD) WITH PROPOFOL  N/A 11/07/2017   Dr. Riley Cheadle: normal esophagus s/p dilation, portal hypertensive gastropathy, erosive gastropathy, normal duodenum   FOOT SURGERY     MALONEY DILATION N/A 11/07/2017   Procedure: MALONEY DILATION;  Surgeon: Suzette Espy, MD;  Location: [facility];  Service: Endoscopy;  Laterality: N/A;   ORIF ANKLE FRACTURE  08/27/2011   Procedure: OPEN REDUCTION INTERNAL FIXATION (ORIF) ANKLE FRACTURE;  Surgeon: Elsa Halls, MD;  Location: [facility];  Service: Orthopedics;  Laterality: Right;   TONSILLECTOMY       SOCIAL HISTORY:  Social History   Socioeconomic History   Marital status: Married    Spouse name: Not on file   Number of children: 0   Years of education: Not on file   Highest education level: Not on file  Occupational History   Occupation: disabled    Employer: UNEMPLOYED  Tobacco Use   Smoking status: Some Days    Current packs/day: 1.00    Average packs/day: 1 pack/day for 30.0 years (30.0 ttl pk-yrs)    Types: Cigarettes   Smokeless tobacco: Never   Tobacco comments:    only smokes about 1-2 cigarettes a day.  Doesn't want to quit.  Vaping Use   Vaping status: Never Used  Substance and Sexual Activity   Alcohol use: No   Drug use: No   Sexual activity: Not on file  Other Topics Concern   Not on file  Social History Narrative   Not on file   Social Drivers of Health   Financial Resource Strain: Low Risk  (04/07/2020)   Received from [facility]   Overall Financial Resource Strain (CARDIA)     Difficulty of Paying Living Expenses: Not very hard  Food Insecurity: Low Risk  (05/18/2023)   Received from [facility]   Hunger Vital Sign    Worried About Running Out of Food in the Last Year: Never true    Ran Out of Food in the Last Year: Never true  Transportation Needs: No Transportation Needs (05/18/2023)   Received from [facility]    In the past 12 months, has lack of reliable transportation kept you from medical appointments, meetings, work or from getting things needed for daily living? : No  Physical Activity: Inactive (04/07/2020)   Received from [facility]   Exercise Vital Sign  Days of Exercise per Week: 0 days    Minutes of Exercise per Session: 0 min  Stress: No Stress Concern Present (10/26/2021)   Received from [facility] - Occupational Stress Questionnaire    Feeling of Stress : Not at all  Social Connections: Unknown (12/28/2021)   Received from [facility], [facility]   Social Network    Social Network: Not on file  Intimate Partner Violence: Unknown (11/25/2021)   Received from [facility]   HITS    Physically Hurt: Not on file    Insult or Talk Down To: Not on file    Threaten Physical Harm: Not on file    Scream or Curse: Not on file    FAMILY HISTORY:  Family History  Problem Relation Age of Onset   Heart failure Mother    Thyroid  disease Mother    Cancer Father    Asthma Brother    Heart attack Brother    Cirrhosis Brother        EtOH   Colon cancer Maternal Aunt     CURRENT MEDICATIONS:  Outpatient Encounter Medications as of 01/19/2024  Medication Sig   acetaminophen  (TYLENOL ) 325 MG tablet Take 650 mg by mouth daily as needed for moderate pain (pain score 4-6) or fever.   aluminum-magnesium  hydroxide 200-200 MG/5ML suspension Take 10 mLs by mouth every 8 (eight) hours as needed for indigestion (Every 8 hrs PRN for  indigestion).   bisacodyl  (DULCOLAX) 5 MG EC tablet Take 5 mg by mouth daily.   Bromfenac Sodium 0.09 % SOLN Place 1 drop into the left eye 2 (two) times daily.   busPIRone (BUSPAR) 7.5 MG tablet Take 7.5 mg by mouth 2 (two) times daily.   dorzolamide-timolol (COSOPT) 2-0.5 % ophthalmic solution Place 1 drop into the left eye in the morning.   fluticasone (CUTIVATE) 0.05 % cream Apply 1 Application topically as directed.   furosemide  (LASIX ) 20 MG tablet Take 20 mg by mouth daily.   gabapentin  (NEURONTIN ) 100 MG capsule Take 200 mg by mouth every morning.   hydrOXYzine (ATARAX) 10 MG tablet Take 10 mg by mouth in the morning and at bedtime.   lactulose , encephalopathy, (CHRONULAC ) 10 GM/15ML SOLN Take 45 mLs by mouth QID.   loratadine (CLARITIN) 10 MG tablet Take 10 mg by mouth daily.   melatonin 3 MG TABS tablet Take 6 mg by mouth at bedtime.   Menthol, Topical Analgesic, 4 % GEL Apply 1 application topically 3 (three) times daily as needed (pain).   metoprolol  tartrate (LOPRESSOR ) 25 MG tablet Take 25 mg by mouth daily.   OLANZapine (ZYPREXA) 7.5 MG tablet Take 7.5 mg by mouth at bedtime.   omeprazole  (PRILOSEC) 20 MG capsule Take 20 mg by mouth 2 (two) times daily before a meal.   Polyethyl Glycol-Propyl Glycol (SYSTANE) 0.4-0.3 % SOLN Place 1 drop into the left eye QID.   potassium chloride  SA (K-DUR) 20 MEQ tablet Take 1 tablet (20 mEq total) by mouth daily. (Patient taking differently: Take 20 mEq by mouth in the morning.)   prednisoLONE acetate (PRED FORTE) 1 % ophthalmic suspension Place 1 drop into the left eye 2 (two) times daily.   rifaximin  (XIFAXAN ) 550 MG TABS tablet Take 1 tablet (550 mg total) by mouth 2 (two) times daily.   sertraline (ZOLOFT) 50 MG tablet Take 50 mg by mouth daily.   traMADol  HCl 25 MG TABS Take 1 tablet by  mouth 2 (two) times daily.   traZODone  (DESYREL ) 50 MG tablet Take 12.5 mg by mouth at bedtime.   No facility-administered encounter medications on file  as of 01/19/2024.    ALLERGIES:  Allergies  Allergen Reactions   Penicillins Rash    Has patient had a PCN reaction causing immediate rash, facial/tongue/throat swelling, SOB or lightheadedness with hypotension: Yes Has patient had a PCN reaction causing severe rash involving mucus membranes or skin necrosis: No Has patient had a PCN reaction that required hospitalization No Has patient had a PCN reaction occurring within the last 10 years: No If all of the above answers are "NO", then may proceed with Cephalosporin use.      PHYSICAL EXAM:  ECOG PERFORMANCE STATUS: 1 - Symptomatic but completely ambulatory  There were no vitals filed for this visit.  There were no vitals filed for this visit. Physical Exam Constitutional:      Appearance: Normal appearance. He is obese.  HENT:     Right Ear: Decreased hearing noted.     Left Ear: Decreased hearing noted.  Cardiovascular:     Rate and Rhythm: Normal rate and regular rhythm.  Pulmonary:     Effort: Pulmonary effort is normal.     Breath sounds: Normal breath sounds.  Abdominal:     General: Bowel sounds are normal. There is distension.     Palpations: Abdomen is soft.  Musculoskeletal:        General: No swelling. Normal range of motion.  Neurological:     Mental Status: He is alert and oriented to person, place, and time. Mental status is at baseline.      LABORATORY DATA:  I have reviewed the labs as listed.  CBC    Component Value Date/Time   WBC 4.3 11/26/2023 0627   RBC 4.21 (L) 11/26/2023 0627   HGB 14.8 11/26/2023 0627   HGB 14.5 11/13/2008 0915   HCT 44.1 11/26/2023 0627   HCT 28 04/06/2012 1550   PLT 51 (L) 11/26/2023 0627   PLT 97 (L) 11/13/2008 0915   MCV 104.8 (H) 11/26/2023 0627   MCV 88.0 04/06/2012 1550   MCH 35.2 (H) 11/26/2023 0627   MCHC 33.6 11/26/2023 0627   RDW 14.1 11/26/2023 0627   RDW 13.1 11/13/2008 0915   LYMPHSABS 0.9 11/26/2023 0627   LYMPHSABS 2.3 11/13/2008 0915   MONOABS  0.6 11/26/2023 0627   MONOABS 0.4 11/13/2008 0915   EOSABS 0.3 11/26/2023 0627   EOSABS 0.2 11/13/2008 0915   BASOSABS 0.1 11/26/2023 0627   BASOSABS 0.0 11/13/2008 0915      Latest Ref Rng & Units 11/26/2023    6:27 AM 07/30/2021    9:12 AM 06/15/2021    8:09 AM  CMP  Glucose 70 - 99 mg/dL 409  811    BUN 8 - 23 mg/dL 10  10    Creatinine 9.14 - 1.24 mg/dL 7.82  9.56  2.13   Sodium 135 - 145 mmol/L 140  136    Potassium 3.5 - 5.1 mmol/L 4.5  4.1    Chloride 98 - 111 mmol/L 103  108    CO2 22 - 32 mmol/L 25  23    Calcium 8.9 - 10.3 mg/dL 9.7  8.6    Total Protein 6.5 - 8.1 g/dL 8.0  6.1    Total Bilirubin 0.0 - 1.2 mg/dL 0.6  3.0    Alkaline Phos 38 - 126 U/L 46  70    AST 15 -  41 U/L 32  51    ALT 0 - 44 U/L 23  22      DIAGNOSTIC IMAGING:  I have independently reviewed the relevant imaging and discussed with the patient.  ASSESSMENT & PLAN: 1.  Chronic portal vein thrombosis  - Referred by GI team Shana Daring, PA-C) on 06/17/2021 for hematology input on chronic portal vein thrombosis and whether or not patient should be anticoagulated - CT abdomen/pelvis (06/15/2021): Hepatic cirrhosis noted.  Suspected chronic occlusion of portal vein with large collateral veins in the left upper quadrant and left retroperitoneal region consistent with portal hypertension. - Patient has not had any other history of thrombotic events such as PE or DVT.  - History of frequent epistaxis although this is improved over the last year. - Currently not on anticoagulation given risk first benefit.  Platelet count remains low less than 50 and he is a high fall risk.  Discussed again with patient and caregiver that the goal of anticoagulation would be to prevent recurrent thrombus, prevent thrombus extension and promote recanalization. - Patient has moderate risk of recurrent thrombosis and thrombus extension, given his underlying cirrhosis. - We will continue to monitor his labs every 4 months and  he will remain off anticoagulation for now.  2.  Thrombocytopenia, moderate to severe: - Review of EMR moderate shows moderate thrombocytopenia since 2011 - platelets have been ranging from 30-60 over the past year. - Prior imaging including CTAP from 2012 and ultrasound of the abdomen from 03/27/2018 shows moderate splenomegaly. - He reportedly has hep C, was treated for it.  Has a known diagnosis of cirrhosis, is on Xifaxan  and lactulose . 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 -Laboratory work-up reviewed from [facility] rehab and health care center (12/29/23)- Plt 43,000 (51000).  No bleeding per patient or caregiver. -Continue to monitor every 4 months.  3.  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 - Labs from 01/05/24 show a hemoglobin of 13.1 (13.5) with MCV of 103.8.  Platelet count is 43,000. - No significant anemia at this time.  Macrocytosis is likely due to liver disease. -Will continue to monitor.   4.  Leukopenia: - He has occasional mild leukopenia with  neutropenia. - He is also on number of medications which could lower his white count.  Splenomegaly can also contribute to leukopenia.  He does not have recurrent infections or B symptoms.   - Work-up above did not reveal any nutritional deficiencies - CBC 01/05/24 showed a white count of 3.8 with an absolute neutrophil count of 1.8.  Discussed neutropenic precautions.  No recent infections. -We will continue to monitor at this time.  5. Palpitations: - Met with Dr. Amanda Jungling on 12/13/2023 for palpitations. -Plan is for  7-day cardiac monitor which she has completed and has follow-up next month.  6.  Social/family history: - Patient resides at the [facility] in [place] - [facility] is also significant for history of hemorrhage of the brain as well as blood clots. He has a deformed right ankle from a previous injury. 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 He reports that he has COPD.  He has known cirrhosis of the liver. - He used to work in a [employer].  He smoked for 43 years, 1 and half pack per day and quit 6 months ago. - Father had cancer of the brain.  Mother had breast cancer and tongue cancer.  Brother had cancer of the liver.  His mother siblings also had cancer.  PLAN SUMMARY: >> RTC in 4 months for follow-up with lab work a few days before from [facility] re[facility].  Recommend CBC, CMP and LDH in 4 months.  >> Labs will be drawn at [facility] rehab and health care center.    All questions were answered. The patient knows to call the clinic with any problems, questions or concerns.  Medical decision making: Moderate  Time spent on visit: I spent 25 minutes dedicated to the care of this patient (face-to-face and non-face-to-face) on the date of the encounter to include what is described in the assessment and plan.   Aurther Blue, NP  07/30/2021 10:55 AM

## 2024-02-03 ENCOUNTER — Ambulatory Visit: Attending: Nurse Practitioner | Admitting: Nurse Practitioner

## 2024-02-03 VITALS — BP 136/80 | HR 64 | Wt 248.0 lb

## 2024-02-03 DIAGNOSIS — Z87898 Personal history of other specified conditions: Secondary | ICD-10-CM | POA: Diagnosis not present

## 2024-02-03 NOTE — Progress Notes (Addendum)
 Cardiology Office Note   Date:  02/03/2024 ID:  [name] [name], [name] [name] 17-Jul-1961, MRN [id number] PCP: System, Provider Not In  [facility] Providers Cardiologist:  Armida Lander, MD     History of Present Illness [name] [name] is a 63 y.o. male with a PMH of palpitations, COPD, hard of hearing, dementia, cirrhosis/chronic portal vein thrombosis, and pancytopenia (follows Hem/Onc), who presents today for 6-week follow-up.  Last seen by Dr. Armida Lander on December 13, 2023.  History was difficult to obtain due to patient being [facility], history also indicated some component of dementia.  Reported daily palpitations, lasting for few minutes and reported fairly high intake of caffeine at times.  Was only taking Lopressor  25 mg in the a.m., reported dose was lowered in the past that was assumed due to be past history of resting low heart rates.  Recent monitor revealed sinus rhythm, average heart rate 63 bpm, 9 SVT runs, longest episode lasted 14 beats, rare ventricular ectopy, no reported symptoms.  Today presents for scheduled follow-up with a SW supervisor.  He is a difficult historian as patient is blind, very hard of hearing, and has known history of dementia.  Doing well from a cardiac perspective.  When I asked him about palpitations, he denies any. Denies any chest pain, shortness of breath, palpitations, syncope, presyncope, dizziness, orthopnea, PND, swelling or significant weight changes, acute bleeding, or claudication.  ROS: Negative.  See HPI.  Studies Reviewed  EKG is not ordered today.  Cardiac monitor 12/2023:   8 day monitor   Rare supraventricular ectopy in the form of isolated PACs, couplets, triplets. 9 runs of SVT longest 14 beats.   Rare ventricular ectopy in the form of isolated PVCs   No reported symptoms.     Patch Wear Time:  8 days and 2 hours (2025-04-22T12:21:02-399 to 2025-04-30T15:17:14-0400)   Patient had a min HR of 54 bpm, max HR of 171 bpm, and  avg HR of 63 bpm. Predominant underlying rhythm was Sinus Rhythm. 9 Supraventricular Tachycardia runs occurred, the run with the fastest interval lasting 14 beats with a max rate of 171 bpm (avg 148  bpm); the run with the fastest interval was also the longest. Isolated SVEs were rare (<1.0%, 387), SVE Couplets were rare (<1.0%, 23), and SVE Triplets were rare (<1.0%, 6). Isolated VEs were rare (<1.0%), and no VE Couplets or VE Triplets were present.  Echocardiogram 01/2019:  1. The left ventricle has normal systolic function with an ejection  fraction of 60-65%. The cavity size was normal. Severe basal septal  hypertrophy. Otherwise, moderate hypertrophy of remaining myocardium. Left  ventricular diastolic function could not  be evaluated secondary to atrial fibrillation. No evidence of left  ventricular regional wall motion abnormalities.   2. The right ventricle has normal systolic function. The cavity was  normal. There is no increase in right ventricular wall thickness.   3. Left atrial size was mildly dilated.   4. Right atrial size was mildly dilated.   5. The mitral valve is grossly normal. Mild thickening of the mitral  valve leaflet.   6. The tricuspid valve is grossly normal.   7. The aortic valve is tricuspid. Mild thickening of the aortic valve.   8. The aortic root is normal in size and structure.   9. Pulmonary hypertension is mild.  10. The inferior vena cava was dilated in size with <50% respiratory  variability.  Physical Exam VS:  BP 136/80   Pulse 64  Wt 248 lb (112.5 kg)   SpO2 95%   BMI 41.91 kg/m    Wt Readings from Last 3 Encounters:  02/03/24 248 lb (112.5 kg)  01/19/24 252 lb 3.2 oz (114.4 kg)  12/13/23 204 lb 12.8 oz (92.9 kg)    GEN: Morbidly obese, 63 year old male in no acute distress, sitting in wheelchair NECK: No JVD; No carotid bruits CARDIAC: S1/S2, RRR, no murmurs, rubs, gallops RESPIRATORY:  Clear to auscultation without rales, wheezing or  rhonchi  ABDOMEN: Soft, non-tender, non-distended EXTREMITIES:  No pitting edema; No deformity   ASSESSMENT AND PLAN  History of palpitations Denies any recent palpitations.  Cardiac monitor noted above was benign.  Continue current medication regimen. Heart healthy diet and regular cardiovascular exercise as tolerated encouraged.   Morbid obesity Weight loss via diet and exercise encouraged. Discussed the impact being overweight would have on cardiovascular risk.  I spent a total duration of 30 minutes reviewing prior notes, reviewing outside records including  labs, face-to-face counseling of  medical condition, pathophysiology, evaluation, management, and documenting the findings in the note.    Dispo: Follow-up with MD/APP in 1 year or sooner if anything changes.  Signed, Lasalle Pointer, NP

## 2024-02-03 NOTE — Patient Instructions (Signed)

## 2024-03-12 ENCOUNTER — Ambulatory Visit: Payer: Self-pay | Admitting: Cardiology

## 2024-03-23 ENCOUNTER — Encounter (HOSPITAL_COMMUNITY): Payer: Self-pay

## 2024-04-23 DEATH — deceased

## 2024-05-24 ENCOUNTER — Inpatient Hospital Stay: Admitting: Oncology

## 2025-02-04 ENCOUNTER — Ambulatory Visit: Admitting: Nurse Practitioner
